# Patient Record
Sex: Male | Born: 1960 | Race: White | Hispanic: No | Marital: Married
Health system: Northeastern US, Academic
[De-identification: ages and names within clinical notes are randomized; demographics above are authoritative.]

## PROBLEM LIST (undated history)

## (undated) ENCOUNTER — Encounter

## (undated) DIAGNOSIS — F1010 Alcohol abuse, uncomplicated: Secondary | ICD-10-CM

## (undated) DIAGNOSIS — R569 Unspecified convulsions: Secondary | ICD-10-CM

## (undated) DIAGNOSIS — I10 Essential (primary) hypertension: Secondary | ICD-10-CM

## (undated) DIAGNOSIS — R7401 Elevation of levels of liver transaminase levels: Secondary | ICD-10-CM

## (undated) DIAGNOSIS — E119 Type 2 diabetes mellitus without complications: Secondary | ICD-10-CM

## (undated) HISTORY — DX: Elevation of levels of liver transaminase levels: R74.01

## (undated) HISTORY — PX: HEMORRHOID SURGERY: SHX153

---

## 2001-10-17 ENCOUNTER — Emergency Department (HOSPITAL_COMMUNITY): Admission: EM | Admit: 2001-10-17 | Discharge: 2001-10-17 | Payer: Self-pay | Admitting: *Deleted

## 2003-12-23 ENCOUNTER — Emergency Department (HOSPITAL_COMMUNITY): Admission: EM | Admit: 2003-12-23 | Discharge: 2003-12-23 | Payer: Self-pay | Admitting: Emergency Medicine

## 2006-07-21 ENCOUNTER — Emergency Department (HOSPITAL_COMMUNITY): Admission: EM | Admit: 2006-07-21 | Discharge: 2006-07-21 | Payer: Self-pay | Admitting: *Deleted

## 2007-10-05 ENCOUNTER — Emergency Department (HOSPITAL_COMMUNITY): Admission: EM | Admit: 2007-10-05 | Discharge: 2007-10-05 | Payer: Self-pay | Admitting: Emergency Medicine

## 2007-10-24 ENCOUNTER — Ambulatory Visit: Payer: Self-pay | Admitting: Internal Medicine

## 2008-07-22 ENCOUNTER — Encounter (INDEPENDENT_AMBULATORY_CARE_PROVIDER_SITE_OTHER): Payer: Self-pay | Admitting: General Surgery

## 2008-07-22 ENCOUNTER — Emergency Department (HOSPITAL_COMMUNITY): Admission: EM | Admit: 2008-07-22 | Discharge: 2008-07-22 | Payer: Self-pay | Admitting: Emergency Medicine

## 2008-07-23 ENCOUNTER — Inpatient Hospital Stay (HOSPITAL_COMMUNITY): Admission: AD | Admit: 2008-07-23 | Discharge: 2008-07-24 | Payer: Self-pay | Admitting: General Surgery

## 2008-10-06 ENCOUNTER — Encounter (INDEPENDENT_AMBULATORY_CARE_PROVIDER_SITE_OTHER): Payer: Self-pay | Admitting: *Deleted

## 2010-06-27 NOTE — Letter (Signed)
Summary: Appointment - Missed  [facility], Main Office  [address]   [place], [place] [address]   Phone: [phone]  Fax: [phone]     Oct 06, 2008 MRN: [id number]   [facility] [place] [address] [name], [place]  [address]   Dear Arthur [name],  Our records indicate you missed your appointment on  September 03, 2008 with Dr. Tenny Craw.                                    It is very important that we reach you to reschedule this appointment.   We look forward to participating in your health care needs. Please contact us at the number listed above at your earliest convenience to reschedule this appointment.  Sincerely,  Burnard Leigh [employer] Scheduling Team

## 2010-09-12 LAB — RAPID URINE DRUG SCREEN, HOSP PERFORMED
Amphetamines: NOT DETECTED
Barbiturates: NOT DETECTED
Benzodiazepines: NOT DETECTED
Cocaine: NOT DETECTED
Opiates: POSITIVE — AB
Tetrahydrocannabinol: NOT DETECTED

## 2010-09-12 LAB — HEMOGLOBIN AND HEMATOCRIT, BLOOD
HCT: 47.3 % (ref 39.0–52.0)
Hemoglobin: 15.4 g/dL (ref 13.0–17.0)

## 2010-10-10 NOTE — Op Note (Signed)
NAMESEVERN, Arthur [name]                  ACCOUNT NO.:  [id number]   MEDICAL RECORD NO.:  [id number]          PATIENT TYPE:  OIB   LOCATION:  1524                         FACILITY:  [facility]   PHYSICIAN:  Lennie Muckle, MD      DATE OF BIRTH:  1960/12/31   DATE OF PROCEDURE:  07/22/2008  DATE OF DISCHARGE:                               OPERATIVE REPORT   PROCEDURE:  Internal and external hemorrhoidectomy.   PREOPERATIVE DIAGNOSIS:  Thrombosed hemorrhoid.   POSTOPERATIVE DIAGNOSES:  Thrombosed hemorrhoid.   SURGEON:  Lennie Muckle, M.D.   ASSISTANT:  Ardeth Sportsman, M.D.   ANESTHESIA:  General endotracheal anesthesia.   FINDINGS:  A large thrombosed hemorrhoid at approximately 8 o'clock.  This was excised, sent to pathology for review.  Minimum amount of blood  loss.  No immediate complications.   INDICATIONS FOR PROCEDURE:  Arthur [name] is a 50 year old male who was seen  at the urgent clinic by Dr. Consuello Bossier.  He was found to have an  acute thrombosed hemorrhoid, rather large in nature, which was felt not  to be amenable to excision in the clinic.  Due to the size it was felt  he would be better served undergoing anesthesia and excision.  Informed  consent was obtained prior to the procedure.   DETAILS OF PROCEDURE:  Arthur [name] was given IV Cipro prior to procedure,  identified in the preoperative holding area, taken to the operating  room.  Once in the operating room, placed in the supine position.  After  administration of general endotracheal anesthesia, he was then placed in  the prone jack-knife position.  Buttock cheeks were taped apart.  Perineum was prepped and draped in the usual sterile fashion.  A time-  out procedure, indicating patient and procedure, was performed.   We injected Wydase into the hemorrhoidal side at approximately 8  o'clock.  Lidocaine 1% was also injected all around the sphincters in  all quadrants.  The anoscopy revealed a large internal-external  hemorrhoid at approximately 8 o'clock.  A smaller lesion was noted at  approximately 2 o'clock.  Using a large Kelly clamp, we clamped across  the internal component of the hemorrhoid.  Using a 2-0 chromic suture, I  performed baseball stitch fashion after excising the hemorrhoidal  tissue.  I went underneath the Kelly clamp and continued in a baseball  stitch fashion to the end.  I then placed another running suture more  anteriorly.  I then excised the external component of the hemorrhoid,  using electrocautery.  I also closed the dermis in a running fashion  with a chromic.  Anoscopy revealed some internal component remaining.  However, this was much smaller in nature.  There is no enlarged  hemorrhoidal tissue elsewhere.  Therefore, I did not perform an excision  of other tissue.  More lidocaine was injected in the hemorrhoidectomy  site.  Dibucaine was placed on the area of excision.  A Gelfoam was  placed in the internal canal with an ABD on top.  The patient was then  placed in  supine position, extubated, transported to the postanesthesia  care unit stable condition.   He will be given Percocet and morphine overnight for pain, started on  stool softener, MiraLax and Colace.  He will be discharged home.  He is  to perform sitz baths t.i.d. with two stool softeners, Percocet for  pain, and then will follow up in two or three weeks.      Lennie Muckle, MD  Electronically Signed     ALA/MEDQ  D:  07/22/2008  T:  07/23/2008  Job:  [id number]

## 2010-10-10 NOTE — Assessment & Plan Note (Signed)
[facility]                            CARDIOLOGY OFFICE NOTE   NAME:Placke, BURAK ZERBE                         MRN:          [id number]  DATE:10/24/2007                            DOB:          [birth year]    IDENTIFICATION:  Mr. Stanislaw is a 50 year old gentleman who was referred  from the emergency room for continued care of his hypertension and  history of chest pain.   HISTORY OF PRESENT ILLNESS:  The patient was seen in the emergency room  on May 19th.  He was complaining of chest pain.  Per report, the patient  had chest pain for about a week; worse if he lied down, better if sat  up.  Denied fevers.  No nausea, vomiting or cough.  Had mild shortness  of breath, symptoms worse at night.  When he went to the emergency room,  he denied any chest pain.  Of note, his blood pressure was noted to be  elevated in the emergency room he was told, but I do not have the  recording of this.  Coronary care markers were done and that was  negative.  EKG was done that showed LVH.  He was sent home with  continued care here.   Since leaving the emergency room, he has done okay.  He denies any chest  pain now.   ALLERGIES:  PENICILLIN.   PAST MEDICAL HISTORY:  Recently diagnosed hypertension.   SOCIAL HISTORY:  The patient smokes about a pack per day for the past 30  years.  Drinks occasionally.   FAMILY HISTORY:  Positive for hypertension and diabetes.   REVIEW OF SYSTEMS:  All systems reviewed.  Negative to the above problem  except as noted above.   PHYSICAL EXAM:  Currently, the patient is in no distress.  Blood pressure is 175/113, pulse is 71 and regular, weight 147.  HEENT:  Normocephalic, atraumatic, EOMI, PERLA.  Mucous membranes are  moist.  NECK:  JVP is normal.  No bruits.  LUNGS:  Clear without rales or wheezes.  CARDIAC EXAM:  Regular rate and rhythm, S1, S2, no S3, no significant  murmurs.  ABDOMEN:  Supple, nontender.  Normal bowel  sounds.  EXTREMITIES:  Good distal pulses throughout.  No lower extremity edema.   12-lead EKG:  Normal sinus rhythm, LVH, 69 beats per minute, nonspecific  T-wave changes.   IMPRESSION:  1. Mr. Lepak is a 50 year old recently seen in the emergency room for      chest pain which resolved; actually he did not have it in the      emergency room.  It sounds more muscular, possible pleuritic.  I      would follow.  2. Hypertension, markedly increased. I would set him on Norvasc 5 mg      and follow up in the clinic.  Also set him up with an      echocardiogram.   When he returns for the echocardiogram, I will get a fasting lipid panel  from [facility].  I will be back in touch with him  and see  him back in 4 weeks' time.     Pricilla Riffle, MD, [facility]  Electronically Signed    PVR/MedQ  DD: 10/24/2007  DT: 10/24/2007  Job #: [id number]

## 2011-02-21 LAB — URINALYSIS, ROUTINE W REFLEX MICROSCOPIC
Bilirubin Urine: NEGATIVE
Glucose, UA: NEGATIVE
Hgb urine dipstick: NEGATIVE
Ketones, ur: NEGATIVE
Nitrite: NEGATIVE
Protein, ur: NEGATIVE
Specific Gravity, Urine: 1.019
Urobilinogen, UA: 1
pH: 6

## 2011-02-21 LAB — POCT I-STAT, CHEM 8
BUN: 17
Calcium, Ion: 1.15
Chloride: 106
Creatinine, Ser: 1.3
Glucose, Bld: 84
HCT: 48
Hemoglobin: 16.3
Potassium: 5
Sodium: 137
TCO2: 27

## 2011-02-21 LAB — DIFFERENTIAL
Basophils Absolute: 0
Basophils Relative: 1
Eosinophils Absolute: 0.2
Eosinophils Relative: 3
Lymphocytes Relative: 30
Lymphs Abs: 1.8
Monocytes Absolute: 0.6
Monocytes Relative: 10
Neutro Abs: 3.5
Neutrophils Relative %: 57

## 2011-02-21 LAB — CBC
HCT: 42.5
Hemoglobin: 14.1
MCHC: 33.1
MCV: 78.3
Platelets: 216
RBC: 5.42
RDW: 14.6
WBC: 6.2

## 2011-02-21 LAB — POCT CARDIAC MARKERS
CKMB, poc: 3.8
Myoglobin, poc: 72.6
Operator id: 196461
Troponin i, poc: <0.05

## 2012-12-26 ENCOUNTER — Emergency Department (HOSPITAL_COMMUNITY)
Admission: EM | Admit: 2012-12-26 | Discharge: 2012-12-26 | Disposition: A | Discharge status: Home / Self Care | Payer: Self-pay | Attending: Emergency Medicine | Admitting: Emergency Medicine

## 2012-12-26 ENCOUNTER — Encounter (HOSPITAL_COMMUNITY): Payer: Self-pay | Admitting: Emergency Medicine

## 2012-12-26 DIAGNOSIS — F17200 Nicotine dependence, unspecified, uncomplicated: Secondary | ICD-10-CM | POA: Insufficient documentation

## 2012-12-26 DIAGNOSIS — Z880 Allergy status to penicillin: Secondary | ICD-10-CM | POA: Insufficient documentation

## 2012-12-26 DIAGNOSIS — I10 Essential (primary) hypertension: Secondary | ICD-10-CM | POA: Insufficient documentation

## 2012-12-26 DIAGNOSIS — K029 Dental caries, unspecified: Secondary | ICD-10-CM | POA: Insufficient documentation

## 2012-12-26 DIAGNOSIS — K089 Disorder of teeth and supporting structures, unspecified: Secondary | ICD-10-CM | POA: Insufficient documentation

## 2012-12-26 HISTORY — DX: Essential (primary) hypertension: I10

## 2012-12-26 MED ORDER — HYDROCODONE-ACETAMINOPHEN 5-325 MG PO TABS: ORAL_TABLET | ORAL | Status: DC

## 2012-12-26 MED ORDER — CLINDAMYCIN HCL 150 MG PO CAPS: 300.0000 mg | ORAL_CAPSULE | Freq: Three times a day (TID) | ORAL | Status: DC

## 2012-12-26 MED ORDER — IBUPROFEN 800 MG PO TABS: 800.0000 mg | ORAL_TABLET | Freq: Three times a day (TID) | ORAL | Status: DC

## 2012-12-26 NOTE — ED Provider Notes (Signed)
CSN: [id number]     Arrival date & time 12/26/12  1227 History     First MD Initiated Contact with Patient 12/26/12 1233     Chief Complaint  Patient presents with  . Dental Pain    1 week hx of l/low dental pain   (Consider location/radiation/quality/duration/timing/severity/associated sxs/prior Treatment) HPI Pt is a 52yo male presenting with 5 day hx of left lower tooth pain that is aching and throbbing, 6/10, worse with chewing.  Pt states he knows he has cavities and needs his molar to be seen by a dentist but has not been able to yet due to financial difficulty.  Has tried ibuprofen and OTC oral analgesic gel that provides minimal relief.  Denies fever, n/v/d.  Denies trouble swallowing or breathing.    Past Medical History  Diagnosis Date  . Hypertension    Past Surgical History  Procedure Laterality Date  . Hemorrhoid surgery     Family History  Problem Relation Age of Onset  . Diabetes Mother   . Hypertension Mother   . Diabetes Father   . Hypertension Father    History  Substance Use Topics  . Smoking status: Current Every Day Smoker    Types: Cigarettes  . Smokeless tobacco: Not on file  . Alcohol Use: Yes    Review of Systems  Constitutional: Negative for fever and chills.  HENT: Positive for dental problem.   All other systems reviewed and are negative.    Allergies  Penicillins  Home Medications   Current Outpatient Rx  Name  Route  Sig  Dispense  Refill  . benzocaine (ORAJEL) 10 % mucosal gel   Mouth/Throat   Use as directed 1 application in the mouth or throat as needed for pain.         [name] ibuprofen (ADVIL,MOTRIN) 800 MG tablet   Oral   Take 1,600 mg by mouth every 8 (eight) hours as needed for pain.         . clindamycin (CLEOCIN) 150 MG capsule   Oral   Take 2 capsules (300 mg total) by mouth 3 (three) times daily. May dispense as 150mg  capsules   60 capsule   0   . HYDROcodone-acetaminophen (NORCO/VICODIN) 5-325 MG per tablet     Take 1-2 pills every 4-6 hours as needed for pain.   10 tablet   0   . ibuprofen (ADVIL,MOTRIN) 800 MG tablet   Oral   Take 1 tablet (800 mg total) by mouth 3 (three) times daily.   21 tablet   0    BP 177/89  Pulse 78  Temp(Src) 98 F (36.7 C) (Oral)  Resp 18  Wt 147 lb (66.679 kg)  SpO2 100% Physical Exam  Nursing note and vitals reviewed. Constitutional: He appears well-developed and well-nourished.  HENT:  Head: Normocephalic and atraumatic. No trismus in the jaw.  Nose: Nose normal.  Mouth/Throat: Oropharynx is clear and moist and mucous membranes are normal. He does not have dentures. No oral lesions. Abnormal dentition. Dental caries present. No dental abscesses, edematous or lacerations. No oropharyngeal exudate, posterior oropharyngeal edema, posterior oropharyngeal erythema or tonsillar abscesses.    Teeth 17 and 18 (left lower molars) TTP, no obvious drainable dental abscess.  Visible dental caries.  Teeth in tact.  No peritonsillar abscess.   Eyes: Conjunctivae are normal. No scleral icterus.  Neck: Normal range of motion.  Cardiovascular: Normal rate, regular rhythm and normal heart sounds.   Pulmonary/Chest: Effort normal and breath sounds  normal. No respiratory distress. He has no wheezes. He has no rales. He exhibits no tenderness.  Musculoskeletal: Normal range of motion.  Neurological: He is alert.  Skin: Skin is warm and dry.  Psychiatric: He has a normal mood and affect. His behavior is normal.    ED Course   Procedures (including critical care time)  Labs Reviewed - No data to display No results found. 1. Pain due to dental caries     MDM  Pt with dental caries c/o 5 day hx of tooth pain.  No obvious dental abscess.  No peritonsillar abscess.    Rx: norco, ibuprofen, clindamycin.  Pt is to call Dr. Russella Dar, DDS, in 24-48hours to make a follow up appointment.     Junius Finner, PA-C 12/26/12 1328

## 2012-12-26 NOTE — ED Notes (Signed)
1 week hx of l/lower dental pain. Tx with OTC meds

## 2012-12-26 NOTE — Progress Notes (Signed)
[facility] provided patient with a [employer], a list of primary care resources, and dental resources.

## 2012-12-26 NOTE — ED Provider Notes (Signed)
  Medical screening examination/treatment/procedure(s) were performed by non-physician practitioner and as supervising physician I was immediately available for consultation/collaboration.   Raylinn Kosar, MD 12/26/12 1521 

## 2013-01-15 ENCOUNTER — Ambulatory Visit: Payer: Self-pay

## 2016-10-03 ENCOUNTER — Ambulatory Visit: Admitting: Family Medicine

## 2016-10-03 NOTE — Progress Notes (Signed)
Observations:  Added new observation of VFC ELIGIBLE: Not VFC Eligible (10/03/2016 10:03)  Added new observation of ENBSRVTYPENC: Quick Note (10/03/2016 10:03)  Added new observation of TDAP: done (04/02/2014 10:04)              Immunization History:  Historical Source: Historical information - Patient recall   Tdap: Tdap - Unspecified Formulation  #1 Historical (04/02/2014)

## 2016-10-08 ENCOUNTER — Ambulatory Visit

## 2016-10-24 ENCOUNTER — Ambulatory Visit

## 2016-10-24 ENCOUNTER — Ambulatory Visit: Admitting: Family Medicine

## 2016-10-24 LAB — HX LIPID PANEL FASTINGX
HX CHD RISK ASSESMENT FACTORX: 4
HX CHOLESTEROL (LIPR): 192 mg/dL (ref <200)
HX HDL CHOLESTEROLX: 48 mg/dL (ref 35.0–55.0)
HX LDL CHOLESTEROLX: 122 mg/dL (ref <130)
HX NON HDL CHOLESTEROLX: 144 mg/dL — ABNORMAL HIGH (ref <130)
HX TRIGLYCERIDES: 108 mg/dL (ref <150)

## 2016-10-24 LAB — HX COMPREHENSIVE METABOLIC PANELX
HX ALBUMIN: 4.6 g/dL (ref 3.2–4.9)
HX ALKALINE PHOSPHATASE: 76 U/L (ref 30.0–117.0)
HX ALT: 17 U/L (ref 0.0–40.0)
HX ANION GAP: 13 mmol/L (ref 9.0–19.0)
HX AST: 27 U/L (ref 0.0–37.0)
HX BICARBONATE: 24 mmol/L (ref 22.0–29.0)
HX BUN/CREAT RATIO: 20 (ref 12.0–20.0)
HX BUN: 16 mg/dL (ref 6.0–20.0)
HX CALCIUM: 9.5 mg/dL (ref 8.5–10.5)
HX CHLORIDE: 106 mmol/L (ref 98.0–110.0)
HX CREATININE: 0.8 mg/dL (ref 0.4–1.2)
HX GLOMERULAR FILTRATION RATE: 101
HX GLUCOSE: 86 mg/dL (ref 70.0–100.0)
HX HEMOLYSIS INDEX: 0 mg/dL (ref 0.0–50.0)
HX ICTERIC INDEX: 1 (ref 0.0–2.0)
HX LIPEMIC INDEX: 9 mg/dL (ref 0.0–40.0)
HX POTASSIUM: 4.1 mmol/L (ref 3.5–5.1)
HX SODIUM: 143 mmol/L (ref 137.0–146.0)
HX TOTAL BILIRUBIN: 0.5 mg/dL (ref 0.2–1.2)
HX TOTAL PROTEIN: 6.9 g/dL (ref 6.5–8.4)

## 2016-10-24 LAB — HX  COMPLETE BLOOD COUNT
HX HEMATOCRIT: 43.1 % (ref 41.0–53.0)
HX HEMOGLOBIN: 14.5 g/dL (ref 13.5–17.5)
HX MEAN CORP.HEMO.CONC.: 33.6 g/dL (ref 31.0–37.0)
HX MEAN CORPUSCULAR HEMOGLOBIN: 29.1 pg (ref 26.0–34.0)
HX MEAN CORPUSCULAR VOLUME: 86.5 fL (ref 80.0–100.0)
HX MEAN PLATELET VOLUME: 10.4 fL (ref 9.4–12.4)
HX PLATELET COUNT: 319 10*3/uL (ref 150.0–400.0)
HX RED BLOOD COUNT: 5 M/uL (ref 4.5–5.9)
HX RED CELL DISTRIBUTION WIDTH SD: 40.3 fL (ref 35.0–51.0)
HX WHITE BLOOD COUNT: 7.9 10*3/uL (ref 4.5–11.0)

## 2016-10-24 NOTE — Progress Notes (Signed)
History of Present Illness:  56 yo man is here for CPX.     Has switched from [facility].   No serious complaints, denies chest pain, SOB, Abdominal pain, palpitations, headache.         Current Problems- Reviewed during today's visit  SCIATICA, LEFT  SNORING  DIVERTICULOSIS  HX OF TESTICULAR CANCER  SEASONAL ALLERGIES  PREVENTIVE HEALTH CARE    Current Medications- Reviewed during today's visit  VITAMIN D 1000 UNIT ORAL TABLET (CHOLECALCIFEROL): Take one tab by mouth daily for bones/low vitamin D  ASPIRIN 81 MG ORAL TABLET DELAYED RELEASE: take one by mouth daily to prevent stroke/heart attack  MULTIVITAMINS ORAL CAPSULE (MULTIPLE VITAMIN): one by mouth daily for health    Current Allergies- Reviewed during today's visit  * NO KNOWN DRUG  ALLERGIES (Critical)  Surgical History  s/p R testicular Cancer removed 2002. k 19 radiation treaments, no chemo.   RIH repair 2002.  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   Family History  Father 76, s/p CVA in 65's, recovered,   Mother 47, hx of esophageal stricture.  4 sisters are healthy.   PGF  d. 67 of ? cause, PGM d. 65's, old age,  MGF d.82, leukemia,  MGM died young.    Social History  Works in Photographer as a Administrator, [employer]. Married with 3  children: 24, Janyth Pupa 21 at [place], daughter 28, Carli senior in [employer].  No pets.      Risk Factors  Tobacco User:no  Smoking Status:never smoked  Passive smoke exposure: No    Drug use: no  Alcohol use: yes      Alcohol Comments:  beer on Saturday nights.    Counseled to quit/cut down: No    HIV high risk behavior: no    Exercise: Yes  Exercise Comments:  walks the [facility], exercises 3 x a week.       Additional Data Recorded today    Colonoscopy:  Normal  on  05/10/2014    Review of Systems   General: Denies fever, chills, sweats, anorexia, fatigue, weakness, malaise, weight loss.   Eyes: Denies visual change or blurring, eye pain.   Ears/Nose/Throat: Denies earache, decreased hearing, difficulty swallowing.    Cardiovascular: Denies chest pain or pressure, palpitations, shortness of breath.   Respiratory: Denies dry cough, productive cough, shortness of breath, wheezing.   Gastrointestinal: Denies acid indigestion, nausea, vomiting, diarrhea, abdominal pain, change in bowel habits, constipation, mucous or blood in stools.   Musculoskeletal: Denies muscle cramps or aches, muscle weakness, morning stiffness, joint pain, joint swelling.   Skin: Denies dry skin, rash, skin ulcers, suspicious lesions.   Psychiatric: Denies anxiety, depression, insomnia.     Vital Signs     Weight: 167 lb. Height: 69.25  in.    BMI: 24.57  BSA: 1.92    No Previous weight   Temperature: 98.49 deg F.     Temp Site: oral    Pulse rate: 136  On Oxygen? No  Pulse Ox (SpO2): 98 BP: 120/76      Patient is not experiencing pain    Comments: Bobby Beltran is a 56 Year Old Male who presents today  for a NP-CPX  Medications and Allergies Reviewed    Signed: Creig Hines [place]....Oct 24, 2016 10:38 AM            Immunization History:  Historical Source: Historical information - Patient recall   Tdap: Tdap - Unspecified Formulation  #1  Historical (03/02/2014)      Physical Exam    General:      well developed, well nourished, in no acute distress.    Head:      normocephalic and atraumatic.    Eyes:      PERRL/EOM intact, conjunctiva and sclera clear with out nystagmus.    Ears:      TM's intact and clear with normal canals with grossly normal hearing.    Nose:      no deformity, discharge, inflammation, or lesions.    Mouth:      no deformity or lesions with good dentition.    Neck:      no masses, thyromegaly, or abnormal cervical nodes.    Chest Wall:      no deformities or breast masses noted.    Lungs:      clear bilaterally to auscultation.    Heart:      non-displaced PMI, chest non-tender; regular rate and rhythm, S1, S2 without murmurs, rubs, or gallops  Abdomen:       normal bowel sounds; no hepatosplenomegaly no ventral,umbilical hernias or masses  noted.    Rectal:      normal exam.    Genitalia:      normal male, testes descended bilaterally without masses, no hernias, no varicoceles noted.    Prostate:      normal size prostate without nodules or assymetry  Msk:      no deformity or scoliosis noted of thoracic or lumbar spine.    Pulses:      pulses normal in all 4 extremities.    Extremities:      no clubbing, cyanosis, edema, or deformity noted with normal full range of motion of all joints.    Neurologic:      no focal deficits, cranial nerves II-XII grossly intact with normal sensation, reflexes, coordination, muscle strength and tone.    Skin:      intact without lesions or rashes.    Cervical Nodes:      no significant adenopathy.    Axillary Nodes:      no significant adenopathy.    Inguinal Nodes:      no significant adenopathy.    Psych:      alert and cooperative; normal mood and affect; normal attention span and concentration.           Assessment and Plan:      ~ SCIATICA, LEFT (M54.32) :  Stable.       ~ PREVENTIVE HEALTH CARE (Z00.00) :  Encouraged exercise of all types, healthy diet, safety, seatbelts.  Patient will call for results.        Patient/Caregiver understand instructions and plan.    New/Revised  Medications Today:   VITAMIN D 1000 UNIT ORAL TABLET (CHOLECALCIFEROL) Take one tab by mouth daily for bones/low vitamin D  ASPIRIN 81 MG ORAL TABLET DELAYED RELEASE (ASPIRIN) take one by mouth daily to prevent stroke/heart attack  MULTIVITAMINS ORAL CAPSULE (MULTIPLE VITAMIN) one by mouth daily for health                      ]

## 2018-02-21 ENCOUNTER — Ambulatory Visit: Admitting: Adult Health

## 2018-02-21 NOTE — Progress Notes (Signed)
Orders:  Added new Test order of COMMP -Comp. Metabolic Panel (CMP) - Signed  Added new Test order of Lipid Panel (LIPR) - Signed  Observations:  Added new observation of ENBSRVTYPENC: Quick Note (02/21/2018 14:10)

## 2018-02-22 ENCOUNTER — Ambulatory Visit

## 2018-02-22 NOTE — Progress Notes (Signed)
History of Present Illness:   57 yo male for cpx     Hx testicular cancer 2003   Utd w preventive care   Exercises routinely Feels well Has no issues or concerns       Current Problems  DIVERTICULOSIS  HX OF TESTICULAR CANCER  SEASONAL ALLERGIES  PREVENTIVE HEALTH CARE    Current Medications- Reviewed during today's visit  VITAMIN D 1000 UNIT ORAL TABLET (CHOLECALCIFEROL): Take one tab by mouth daily for bones/low vitamin D  MULTIVITAMINS ORAL CAPSULE (MULTIPLE VITAMIN): one by mouth daily for health    Current Allergies- Reviewed during today's visit  * NO KNOWN DRUG  ALLERGIES (Critical)    Past Medical History  Sciatica; left  Snoring    Surgical History  s/p R testicular Cancer removed 2002. k 19 radiation treaments, no chemo.   RIH repair 2002.  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     Family History  Father 59, s/p CVA in 78's, recovered, triple CABBG  for CAD Mother 80, hx of esophageal stricture.  4 sisters are healthy.   PGF  d. 38 of ? cause, PGM d. 59's, old age,  MGF d.82, leukemia,  MGM died young.      Social History  Works in Photographer as a Administrator, [employer]. Married with 3  children: 25, Janyth Pupa 23  lives at home . Daughter 73, Carli Engineer, maintenance ([employer]) institute [place]    Risk Factors  Smoking Status: never smoked  Passive smoke exposure: No    Drug use: no  Alcohol use: yes      Alcohol Comments:  beer on Saturday nights.    Counseled to quit/cut down: No    HIV high risk behavior: no    Exercise: Yes  Exercise Comments:  walks the [facility], exercises 3 x a week.   uses exercise equipment         Review of Systems   General: Denies fever, chills, sweats, anorexia, fatigue, weakness, malaise, weight loss.   Eyes: Denies visual change or blurring, eye pain.   Ears/Nose/Throat: Denies earache, decreased hearing, difficulty swallowing.   Cardiovascular: Denies chest pain or pressure, palpitations, shortness of breath.   Respiratory: Denies dry cough, productive cough, shortness of breath, wheezing.    Gastrointestinal: Denies acid indigestion, nausea, vomiting, diarrhea, abdominal pain, change in bowel habits, constipation, mucous or blood in stools.   Genitourinary: Denies dysuria, decreased urinary stream, nocturia, erectile dysfunction, testicular pain or masses.   Musculoskeletal: Denies muscle cramps or aches, muscle weakness, morning stiffness, joint pain, joint swelling.   Skin: Denies dry skin, rash, skin ulcers, suspicious lesions.   Neurologic: Denies memory loss, parasthesias, dizziness, headaches, transient weakness.   Psychiatric: Denies anxiety, depression, insomnia.   Endocrine: Denies skin changes, hair loss, weight gain, weight loss, cold intolerance, heat intolerance, polyuria, polydipsia, loss of libido.   Heme/Lymphatic: Denies easy bruising, fatigue, unusual bleeding, fevers, night sweats.     Vital Signs     Patient: 57 Years Old Male  Height:  69.25 in.  Weight: 160 lbs      Wt Chg: -7 since 10/24/2016  BMI:  23.54        24.57 on 10/24/2016  BP:  125/82 right arm, normal cuff, seated     120/76 on 10/24/2016   Pulse:  82         Pulse Ox: 98 %    Comments: Bobby Beltran is a 57 Year Old Male who is here today  for a physical.   Medications and Allergies Reviewed    Signed: Majel Homer Harrington Beltran.Marland KitchenMarland KitchenMarland KitchenSeptember 28, 2019 11:05 AM    PHQ 2    Over the last 2 weeks, how often have you been bothered by any of the following problems?  1. Little interest or pleasure in doing things:  0   - Not at all  2. Feeling down, depressed, or hopeless:  0   - Not at all        Vaccines Ordered: Flu IM  Ordering Provider:  Foley NP; Dee        Physical Exam    General:      well developed, well nourished, in no acute distress.    Head:      normocephalic and atraumatic.    Eyes:      PERRL/EOM intact, conjunctiva and sclera clear with out nystagmus.    Ears:      TM's intact and clear with normal canals with grossly normal hearing.    Nose:      no deformity, discharge, inflammation, or lesions.    Mouth:      no  deformity or lesions with good dentition.    Neck:      no masses, thyromegaly, or abnormal cervical nodes.  no bruit.    Chest Wall:      no deformities or breast masses noted.    Lungs:      clear bilaterally to auscultation.    Heart:      non-displaced PMI, chest non-tender; regular rate and rhythm, S1, S2 without murmurs, rubs, or gallops  Abdomen:       normal bowel sounds; no hepatosplenomegaly no ventral,umbilical hernias or masses noted.    Rectal:      normal exam.  hemoccult negative.    Genitalia:      normal male,  L testes descended without masses, no hernias, no varicoceles noted.    Prostate:      normal size prostate without nodules or assymetry  Msk:      no deformity or scoliosis noted of thoracic or lumbar spine.    Pulses:      pulses normal in all 4 extremities.    Extremities:      no clubbing, cyanosis, edema, or deformity noted with normal full range of motion of all joints.    Neurologic:      no focal deficits, cranial nerves II-XII grossly intact with normal sensation, reflexes, coordination, muscle strength and tone.    Skin:      intact without lesions or rashes.    Cervical Nodes:      no significant adenopathy.    Axillary Nodes:      no significant adenopathy.    Inguinal Nodes:      no significant adenopathy.    Psych:      alert and cooperative; normal mood and affect; normal attention span and concentration.           Assessment and Plan:      ~PREVENTIVE HEALTH CARE (Z00.00)      Medication(s): VITAMIN D 1000 UNIT ORAL TABLET (CHOLECALCIFEROL): Take one tab by mouth daily for bones/low vitamin D, MULTIVITAMINS ORAL CAPSULE (MULTIPLE VITAMIN): one by mouth daily for health    Order(s): FIT - Fecal Immunoassay Test, PSA - Screening**  cmp,lipids. Reinforced self care healthy habits, continued exercise         Medications Removed Today:   ASPIRIN 81 MG ORAL TABLET DELAYED RELEASE (  ASPIRIN) take one by mouth daily to prevent stroke/heart attack; Route: ORAL    Patient  Instructions    Fu  in 1 yr

## 2018-03-22 ENCOUNTER — Ambulatory Visit: Admitting: Adult Health

## 2018-03-22 ENCOUNTER — Ambulatory Visit

## 2018-03-22 LAB — HX COMPREHENSIVE METABOLIC PANEL
HX ALBUMIN: 4 g/dL (ref 3.2–5.0)
HX ALKALINE PHOSPHATASE: 80 U/L (ref 30.0–117.0)
HX ALT: 24 U/L (ref 6.0–55.0)
HX ANION GAP: 8 (ref 3.0–11.0)
HX AST: 14 U/L (ref 6.0–40.0)
HX BICARBONATE: 25 mmol/L (ref 21.0–32.0)
HX BUN: 15 mg/dL (ref 6.0–20.0)
HX CALCIUM: 9.9 mg/dL (ref 8.5–10.5)
HX CHLORIDE: 107 mmol/L (ref 98.0–110.0)
HX CREATININE: 0.84 mg/dL (ref 0.55–1.3)
HX GLOMERULAR FR AFRICAN AMERICAN: >90
HX GLOMERULAR FR NON AFRICAN AMER: >90
HX GLUCOSE: 87 mg/dL (ref 70.0–110.0)
HX POTASSIUM: 4.3 mmol/L (ref 3.6–5.2)
HX SODIUM: 140 mmol/L (ref 136.0–146.0)
HX TOTAL BILIRUBIN: 0.5 mg/dL (ref 0.2–1.2)
HX TOTAL PROTEIN: 6.9 g/dL (ref 6.0–8.4)

## 2018-03-22 LAB — HX LIPID PANEL FASTING
HX CHOLESTEROL (LIPR): 196 mg/dL (ref <=200)
HX HDL CHOLESTEROL: 50 mg/dL (ref >=40)
HX LDL CHOLESTEROL: 122 mg/dL (ref <=130)
HX TRIGLYCERIDES: 121 mg/dL (ref <=150)

## 2018-03-22 LAB — HX PROSTATE SPECIFIC ANTIGEN SCR: HX PROSTATE SPECIFIC ANTIGEN SCR: 1 ng/mL (ref 0.0–4.0)

## 2018-03-25 ENCOUNTER — Ambulatory Visit: Admitting: Adult Health

## 2018-03-25 NOTE — Progress Notes (Signed)
Haynes Bast, MD and Ellan Lambert, MD   [address]   [place], [place] [address]  Office: [phone] Fax: [phone]  March 25, 2018      Bobby Beltran  [address]  [place], [place]  [address]    Dear Loraine Leriche,    I have received the results of your most recent labwork. The results are listed below:     Labs Your Value Normal Result Date   Total Cholesterol: 196 Goal: less than 200 03/22/2018   HDL (good cholesterol):  50 Normal : >40 03/22/2018   LDL (bad cholesterol):  122 Goal: less than 130 03/22/2018   Triglycerides: 121 Goal: less than 200 03/22/2018   LDL-direct (bad cholesterol):  Goal: less than 130    Cardiac CRP (helps predict risk of heart disease)  0-1 = low risk  1-3 = average risk  >3 = high risk      Blood sugar 87 Normal : 70-100 03/22/2018   Hemoglobin A1C   (3 month sugar test)  Normal: 4.2 - 5.6  Prediabetes: 5.7-6.4  Diabetes: >6.5      Estimated Average Glucose  (3 month Average)  Goal (if you have Diabetes): less than 150    Urine microalbumin     Normal: < 20       Creatinine (kidney function) 0.84 Normal: 0.55 - 1.3   03/22/2018   ALT (liver test) 24 Normal ALT 6-55   03/22/2018   AST (liver test) 14  Normal AST 6-40 03/22/2018   Hematocrit   (blood count) 43.1 Normal male: 68- 40  Normal male: 40-52 10/24/2016   TSH (thyroid test)  Normal: 0.35-3.7    TSH (ultra thyroid test)  Normal: 0.35 - 3.7      PSA (prostate test)   1.00 NGM/ML   Normal: less than 4.0   03/22/2018   Uric Acid (high in people with gout)  Normal < 7.0    Results are in acceptable ranges.       Sincerely,        Virgina Organ NP

## 2018-09-25 ENCOUNTER — Ambulatory Visit

## 2018-10-07 ENCOUNTER — Ambulatory Visit

## 2019-04-18 ENCOUNTER — Ambulatory Visit: Admitting: Adult Health

## 2019-04-18 NOTE — Progress Notes (Signed)
 Visit Type:  Annual Physical  Primary Provider:  Haynes Bast, M.D.      History of Present Illness:  58 y.o. male presents for cpx.  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        Current Allergies- Reviewed during today's visit  * NO KNOWN DRUG  ALLERGIES (Critical)              Review of Systems   General: Denies fever, chills, sweats, anorexia, fatigue, weakness, malaise, weight loss.   Eyes: Denies visual change or blurring, eye pain.   Ears/Nose/Throat: Denies earache, decreased hearing, difficulty swallowing.   Cardiovascular: Denies chest pain or pressure, palpitations, shortness of breath.   Respiratory: Denies dry cough, productive cough, shortness of breath, wheezing.   Gastrointestinal: Denies acid indigestion, nausea, vomiting, diarrhea, abdominal pain, change in bowel habits, constipation, mucous or blood in stools.   Genitourinary: Denies dysuria, decreased urinary stream, nocturia, erectile dysfunction, testicular pain or masses.   Musculoskeletal: Denies muscle cramps or aches, muscle weakness, morning stiffness, joint pain, joint swelling.   Skin: Denies dry skin, rash, skin ulcers, suspicious lesions.   Neurologic: Denies memory loss, parasthesias, dizziness, headaches, transient weakness.   Psychiatric: Denies anxiety, depression, insomnia.   Endocrine: Denies skin changes, hair loss, weight gain, weight loss, cold intolerance, heat intolerance, polyuria, polydipsia, loss of libido.   Heme/Lymphatic: Denies easy bruising, fatigue, unusual bleeding, fevers, night sweats.     Vital Signs     Patient: 58 Years Old Male  Height:  69.25 in.  Weight: 169.20 lbs      Wt Chg: 9.20 since 02/22/2018  BMI:  24.90        23.54 on 02/22/2018  BP:  124/78     125/82 on  02/22/2018   Temp:  98.2  F      Pulse:  84         Pulse Ox: 97 %    Comments: CPX Wants a referral to a derm   Medications and Allergies Reviewed    Signed: Evaristo Beltran.Marland KitchenMarland KitchenMarland KitchenNovember 21, 2020 10:16 AM    PHQ 2    Over the last 2 weeks, how often have you been bothered by any of the following problems?  1. Little interest or pleasure in doing things:  0   - Not at all  2. Feeling down, depressed, or hopeless:  0   - Not at all    Social Determinants of Health    What is your housing situation today?        I have housing  Within the past 12 months, we worried whether our food would run out before we got money to buy more.        Never true  Within the last 12 months, the food bought just didn't last and didn't have money to get more.        Never true  In the last 12 months have the electric, gas, oil, or water company threatened to shut off services in your home?        No  In the last 12 months, have you missed a medical appointment because you didn't have a way to get there?  No  Are you currently unemployed or looking for work?        No  Do you feel physically and emotionally safe where you currently live?        Yes  Do you ever feel alone or isolated from friends, family or anyone else in your life?        No I do not feel alone or isolated  Would you like help with any of the above? No      Vaccines the patient has refused   Zoster: Patient refused   Flu Vaccine: Patient refused         Vaccines reviewed - patient/parent declines  Vaccines Ordered: Flu IM  Ordering Provider:  Claiborne Billings NP; Misty Stanley        Physical Exam    General:      well developed, well nourished, in no acute distress.    Head:      normocephalic and atraumatic.    Eyes:      PERRL/EOM intact, conjunctiva and sclera clear with out nystagmus.    Ears:      TM's intact and clear with normal canals with grossly normal hearing.    Nose:      no deformity, discharge, inflammation, or lesions.    Mouth:      no deformity or lesions with good  dentition.    Neck:      no masses, thyromegaly, or abnormal cervical nodes.  no bruit.    Chest Wall:      no deformities or breast masses noted.    Breasts:      no masses or gynecomastia noted.    Lungs:      clear bilaterally to auscultation.    Heart:      non-displaced PMI, chest non-tender; regular rate and rhythm, S1, S2 without murmurs, rubs, or gallops  Abdomen:       normal bowel sounds; no hepatosplenomegaly no ventral,umbilical hernias or masses noted.    Rectal:      normal exam.  hemoccult negative.    Genitalia:      normal male,  L testes descended bilaterally without masses, no hernias, no varicoceles noted.    Prostate:      normal size prostate without nodules or assymetry  Msk:      no deformity or scoliosis noted of thoracic or lumbar spine.    Pulses:      pulses normal in all 4 extremities.    Extremities:      no clubbing, cyanosis, edema, or deformity noted with normal full range of motion of all joints.    Neurologic:      no focal deficits, cranial nerves II-XII grossly intact with normal sensation, reflexes, coordination, muscle strength and tone.    Skin:      intact without lesions or rashes.   back with large pearly lesion   Cervical Nodes:      no significant adenopathy.    Axillary Nodes:      no significant adenopathy.    Inguinal Nodes:      no significant adenopathy.    Psych:      alert and cooperative; normal mood and affect; normal attention span and concentration.           Assessment and Plan:      ~DIVERTICULOSIS (K57.90)  Stable. due for colonoscopy      ~HX OF TESTICULAR CANCER (Z85.47)  Stable.      ~SEASONAL ALLERGIES (  J30.2)  Stable.      ~PREVENTIVE HEALTH CARE (Z00.00)  labs, refused flu vaccine, refer to dermatology for removal and skin check      ~FAMILY HISTORY OF SQUAMOUS CELL CARCINOMA OF SKIN (Z80.8)  refer to dermatology         Problems Reviewed  Patient/Caregiver understand instructions and plan.    Patient Instructions     DR. Obriens office [phone]  (  Dr.Taub)

## 2019-08-08 ENCOUNTER — Ambulatory Visit

## 2019-08-08 ENCOUNTER — Ambulatory Visit: Admitting: Adult Health

## 2019-08-08 LAB — HX COMPREHENSIVE METABOLIC PANEL
HX ALBUMIN: 4.1 g/dL (ref 3.2–5.0)
HX ALKALINE PHOSPHATASE: 75 U/L (ref 30.0–117.0)
HX ALT: 26 U/L (ref 6.0–55.0)
HX ANION GAP: 7 (ref 3.0–11.0)
HX AST: 17 U/L (ref 6.0–40.0)
HX BICARBONATE: 27 mmol/L (ref 21.0–32.0)
HX BUN: 16 mg/dL (ref 6.0–20.0)
HX CALCIUM: 9.4 mg/dL (ref 8.5–10.5)
HX CHLORIDE: 106 mmol/L (ref 98.0–110.0)
HX CREATININE: 0.8 mg/dL (ref 0.55–1.3)
HX GLOMERULAR FR AFRICAN AMERICAN: >90
HX GLOMERULAR FR NON AFRICAN AMER: >90
HX GLUCOSE: 90 mg/dL (ref 70.0–110.0)
HX POTASSIUM: 4.7 mmol/L (ref 3.6–5.2)
HX SODIUM: 140 mmol/L (ref 136.0–146.0)
HX TOTAL BILIRUBIN: 0.5 mg/dL (ref 0.2–1.2)
HX TOTAL PROTEIN: 7 g/dL (ref 6.0–8.4)

## 2019-08-08 LAB — HX  COMPLETE BLOOD COUNT
HX HEMATOCRIT: 48.6 % (ref 39.0–53.0)
HX HEMOGLOBIN: 16.1 g/dL (ref 13.0–17.5)
HX MEAN CORP.HEMO.CONC.: 33.1 g/dL (ref 31.0–37.0)
HX MEAN CORPUSCULAR HEMOGLOBIN: 29.1 pg (ref 26.0–34.0)
HX MEAN CORPUSCULAR VOLUME: 87.9 fL (ref 80.0–100.0)
HX MEAN PLATELET VOLUME: 10.4 fL (ref 9.4–12.4)
HX NUCLEATED RBC %: 0 % (ref 0.0–0.0)
HX PLATELET COUNT: 341 10*3/uL (ref 150.0–400.0)
HX RED BLOOD COUNT: 5.53 10*6/uL (ref 4.2–5.9)
HX RED CELL DISTRIBUTION WIDTH SD: 40.5 fL (ref 35.0–51.0)
HX WHITE BLOOD COUNT: 7 10*3/uL (ref 4.0–11.0)

## 2019-08-08 LAB — HX LIPID PANEL FASTING
HX CHOLESTEROL (LIPR): 212 mg/dL — ABNORMAL HIGH (ref <=200)
HX HDL CHOLESTEROL: 46 mg/dL (ref >=40)
HX LDL CHOLESTEROL: 139 mg/dL — ABNORMAL HIGH (ref <=130)
HX TRIGLYCERIDES: 134 mg/dL (ref <=150)

## 2019-08-08 LAB — HX HEPATITIS C VIRAL ANTIBODY: HX HEPATITIS C VIRAL ANTIBODY: NONREACTIVE

## 2019-10-02 ENCOUNTER — Observation Stay (HOSPITAL_COMMUNITY): Payer: Self-pay

## 2019-10-02 ENCOUNTER — Emergency Department (HOSPITAL_COMMUNITY): Payer: Self-pay

## 2019-10-02 ENCOUNTER — Ambulatory Visit (HOSPITAL_COMMUNITY)
Admission: EM | Admit: 2019-10-02 | Discharge: 2019-10-02 | Disposition: A | Discharge status: Home / Self Care | Payer: Self-pay | Attending: Internal Medicine | Admitting: Internal Medicine

## 2019-10-02 ENCOUNTER — Other Ambulatory Visit: Payer: Self-pay

## 2019-10-02 ENCOUNTER — Encounter (HOSPITAL_COMMUNITY): Payer: Self-pay

## 2019-10-02 ENCOUNTER — Inpatient Hospital Stay (HOSPITAL_COMMUNITY)
Admission: EM | Admit: 2019-10-02 | Discharge: 2019-10-05 | DRG: 066 | Disposition: A | Discharge status: Home / Self Care | Payer: Self-pay | Source: Ambulatory Visit | Attending: Internal Medicine | Admitting: Internal Medicine

## 2019-10-02 DIAGNOSIS — Z20822 Contact with and (suspected) exposure to covid-19: Secondary | ICD-10-CM | POA: Diagnosis present

## 2019-10-02 DIAGNOSIS — I6529 Occlusion and stenosis of unspecified carotid artery: Secondary | ICD-10-CM | POA: Diagnosis present

## 2019-10-02 DIAGNOSIS — F17210 Nicotine dependence, cigarettes, uncomplicated: Secondary | ICD-10-CM | POA: Diagnosis present

## 2019-10-02 DIAGNOSIS — I10 Essential (primary) hypertension: Secondary | ICD-10-CM | POA: Diagnosis present

## 2019-10-02 DIAGNOSIS — Z833 Family history of diabetes mellitus: Secondary | ICD-10-CM

## 2019-10-02 DIAGNOSIS — R29704 NIHSS score 4: Secondary | ICD-10-CM | POA: Diagnosis present

## 2019-10-02 DIAGNOSIS — I639 Cerebral infarction, unspecified: Secondary | ICD-10-CM

## 2019-10-02 DIAGNOSIS — R4182 Altered mental status, unspecified: Secondary | ICD-10-CM | POA: Diagnosis present

## 2019-10-02 DIAGNOSIS — E785 Hyperlipidemia, unspecified: Secondary | ICD-10-CM | POA: Diagnosis present

## 2019-10-02 DIAGNOSIS — I63412 Cerebral infarction due to embolism of left middle cerebral artery: Principal | ICD-10-CM | POA: Diagnosis present

## 2019-10-02 DIAGNOSIS — Z8673 Personal history of transient ischemic attack (TIA), and cerebral infarction without residual deficits: Secondary | ICD-10-CM

## 2019-10-02 DIAGNOSIS — F1010 Alcohol abuse, uncomplicated: Secondary | ICD-10-CM | POA: Diagnosis present

## 2019-10-02 DIAGNOSIS — Z880 Allergy status to penicillin: Secondary | ICD-10-CM

## 2019-10-02 DIAGNOSIS — Z8249 Family history of ischemic heart disease and other diseases of the circulatory system: Secondary | ICD-10-CM

## 2019-10-02 DIAGNOSIS — R4701 Aphasia: Secondary | ICD-10-CM | POA: Diagnosis present

## 2019-10-02 DIAGNOSIS — I6930 Unspecified sequelae of cerebral infarction: Secondary | ICD-10-CM

## 2019-10-02 HISTORY — DX: Alcohol abuse, uncomplicated: F10.10

## 2019-10-02 LAB — URINALYSIS, ROUTINE W REFLEX MICROSCOPIC
Bacteria, UA: NONE SEEN
Bilirubin Urine: NEGATIVE
Glucose, UA: NEGATIVE mg/dL
Hgb urine dipstick: NEGATIVE
Ketones, ur: 5 mg/dL — AB
Leukocytes,Ua: NEGATIVE
Nitrite: NEGATIVE
Protein, ur: 100 mg/dL — AB
Specific Gravity, Urine: 1.032 — ABNORMAL HIGH (ref 1.005–1.030)
pH: 6 (ref 5.0–8.0)

## 2019-10-02 LAB — RESPIRATORY PANEL BY RT PCR (FLU A&B, COVID)
Influenza A by PCR: NEGATIVE
Influenza B by PCR: NEGATIVE
SARS Coronavirus 2 by RT PCR: NEGATIVE

## 2019-10-02 LAB — DIFFERENTIAL
Abs Immature Granulocytes: 0.01 10*3/uL (ref 0.00–0.07)
Basophils Absolute: 0 10*3/uL (ref 0.0–0.1)
Basophils Relative: 1 %
Eosinophils Absolute: 0.1 10*3/uL (ref 0.0–0.5)
Eosinophils Relative: 1 %
Immature Granulocytes: 0 %
Lymphocytes Relative: 30 %
Lymphs Abs: 1.6 10*3/uL (ref 0.7–4.0)
Monocytes Absolute: 0.7 10*3/uL (ref 0.1–1.0)
Monocytes Relative: 14 %
Neutro Abs: 2.8 10*3/uL (ref 1.7–7.7)
Neutrophils Relative %: 54 %

## 2019-10-02 LAB — APTT: aPTT: 28 seconds (ref 24–36)

## 2019-10-02 LAB — CBC
HCT: 40.9 % (ref 39.0–52.0)
Hemoglobin: 13.8 g/dL (ref 13.0–17.0)
MCH: 27.5 pg (ref 26.0–34.0)
MCHC: 33.7 g/dL (ref 30.0–36.0)
MCV: 81.6 fL (ref 80.0–100.0)
Platelets: 200 10*3/uL (ref 150–400)
RBC: 5.01 MIL/uL (ref 4.22–5.81)
RDW: 16.3 % — ABNORMAL HIGH (ref 11.5–15.5)
WBC: 5.2 10*3/uL (ref 4.0–10.5)
nRBC: 0 % (ref 0.0–0.2)

## 2019-10-02 LAB — COMPREHENSIVE METABOLIC PANEL
ALT: 52 U/L — ABNORMAL HIGH (ref 0–44)
AST: 53 U/L — ABNORMAL HIGH (ref 15–41)
Albumin: 4 g/dL (ref 3.5–5.0)
Alkaline Phosphatase: 72 U/L (ref 38–126)
Anion gap: 10 (ref 5–15)
BUN: 9 mg/dL (ref 6–20)
CO2: 25 mmol/L (ref 22–32)
Calcium: 9 mg/dL (ref 8.9–10.3)
Chloride: 101 mmol/L (ref 98–111)
Creatinine, Ser: 0.99 mg/dL (ref 0.61–1.24)
GFR calc Af Amer: >60 mL/min (ref >60)
GFR calc non Af Amer: >60 mL/min (ref >60)
Glucose, Bld: 96 mg/dL (ref 70–99)
Potassium: 4 mmol/L (ref 3.5–5.1)
Sodium: 136 mmol/L (ref 135–145)
Total Bilirubin: 0.7 mg/dL (ref 0.3–1.2)
Total Protein: 7.4 g/dL (ref 6.5–8.1)

## 2019-10-02 LAB — RAPID URINE DRUG SCREEN, HOSP PERFORMED
Amphetamines: NOT DETECTED
Barbiturates: NOT DETECTED
Benzodiazepines: NOT DETECTED
Cocaine: NOT DETECTED
Opiates: NOT DETECTED
Tetrahydrocannabinol: NOT DETECTED

## 2019-10-02 LAB — PROTIME-INR
INR: 1 (ref 0.8–1.2)
Prothrombin Time: 12.5 seconds (ref 11.4–15.2)

## 2019-10-02 LAB — AMMONIA: Ammonia: 34 umol/L (ref 9–35)

## 2019-10-02 LAB — ETHANOL: Alcohol, Ethyl (B): <10 mg/dL (ref <10)

## 2019-10-02 MED ORDER — THIAMINE HCL 100 MG PO TABS
100.0000 mg | ORAL_TABLET | Freq: Every day | ORAL | Status: DC
  Administered 2019-10-03 – 2019-10-05 (×3): 100 mg via ORAL
  Filled 2019-10-02 (×3): qty 1

## 2019-10-02 MED ORDER — ACETAMINOPHEN 650 MG RE SUPP: 650.0000 mg | Freq: Four times a day (QID) | RECTAL | Status: DC | PRN

## 2019-10-02 MED ORDER — ACETAMINOPHEN 325 MG PO TABS
650.0000 mg | ORAL_TABLET | Freq: Four times a day (QID) | ORAL | Status: DC | PRN
  Filled 2019-10-02: qty 2

## 2019-10-02 MED ORDER — SENNOSIDES-DOCUSATE SODIUM 8.6-50 MG PO TABS: 1.0000 | ORAL_TABLET | Freq: Every evening | ORAL | Status: DC | PRN

## 2019-10-02 MED ORDER — ENOXAPARIN SODIUM 40 MG/0.4ML ~~LOC~~ SOLN
40.0000 mg | SUBCUTANEOUS | Status: DC
  Administered 2019-10-03 – 2019-10-04 (×2): 40 mg via SUBCUTANEOUS
  Filled 2019-10-02 (×2): qty 0.4

## 2019-10-02 MED ORDER — FOLIC ACID 1 MG PO TABS
1.0000 mg | ORAL_TABLET | Freq: Every day | ORAL | Status: DC
  Administered 2019-10-03 – 2019-10-05 (×3): 1 mg via ORAL
  Filled 2019-10-02 (×3): qty 1

## 2019-10-02 MED ORDER — IOHEXOL 350 MG/ML SOLN
75.0000 mL | Freq: Once | INTRAVENOUS | Status: AC | PRN
  Administered 2019-10-02: 75 mL via INTRAVENOUS

## 2019-10-02 NOTE — ED Notes (Signed)
Pt given ice water per request. No additional requests at this time.

## 2019-10-02 NOTE — Hospital Course (Addendum)
Admitted 10/02/2019  Allergies: Penicillins Pertinent Hx: HTN, alcohol use disorder  59 y.o. male p/w confusion, aphasia x 2 days. Sent to ED from [facility]  * Acute ischemic stroke: Confused, aphasic and could not follow commands on arrival. CT A showed acute stroke. Neuro recommended MRI. Mental status slightly improved on admission but still not fully oriented & has word finding difficulty, impaired naming, repetition. Pending neuro rec.  *Alcohol use disorder: 3-4 mini bottles of liquor a day. Last consumption a day before admission.  Ethanol level normal.On CIWA.  Home meds: Amlodipine, Norco, ibuprofen Labs: Creatinine 0.99.  AST/ALT 53/52  Consults: Neurology Meds: thiamine, folate, VTE ppx: lovenox IVF: none Diet: npo until passes swallow eval

## 2019-10-02 NOTE — Consult Note (Signed)
Neurology Consultation  Reason for Consult: Aphasia Referring Physician: Dr. Kennis Carina  CC: Speech difficulty  History is obtained from: Chart review.  Attempted to call spouse-listed on the chart-no response  HPI: Arthur [name] is a 59 y.o. male past medical history of alcohol abuse and hypertension, brought in by family member to the emergency room for evaluation because of not acting right and acting confused. It is reported that he had been for the past 2 days not being able to talk correctly.  He cannot recall things and appeared confused.  Initially evaluated for confusion.  Given the strong alcohol abuse history, lab work and blood work performed and brain imaging ordered, MRI brain that was completed revealed an evolving left MCA territory infarct that is subacute. Neurological consultation obtained for further recommendations. I attempted to call the spouse, listed on the chart but was not able to speak with anyone as there was no answer on the other hand. Patient is unable to provide any reliable or coherent history at this time.   LKW: 2 days ago per chart review and ED provider handoff report. tpa given?: no, outside the window Premorbid modified Rankin scale (mRS): Unable to reliably ascertain-we will have to wait till a family member arrives to reliably ascertain this.  ROS: Unable to perform due to patient's aphasia  Past Medical History:  Diagnosis Date  . ETOH abuse   . Hypertension     Family History  Problem Relation Age of Onset  . Diabetes Mother   . Hypertension Mother   . Diabetes Father   . Hypertension Father     Social History:   reports that he has been smoking cigarettes. He has never used smokeless tobacco. He reports current alcohol use. No history on file for drug.  Medications No current facility-administered medications for this encounter.  Current Outpatient Medications:  .  amLODipine (NORVASC) 5 MG tablet, Take by mouth., Disp: , Rfl:   .  benzocaine (ORAJEL) 10 % mucosal gel, Use as directed 1 application in the mouth or throat as needed for pain., Disp: , Rfl:  .  HYDROcodone-acetaminophen (NORCO/VICODIN) 5-325 MG per tablet, Take 1-2 pills every 4-6 hours as needed for pain., Disp: 10 tablet, Rfl: 0 .  ibuprofen (ADVIL,MOTRIN) 800 MG tablet, Take 1 tablet (800 mg total) by mouth 3 (three) times daily., Disp: 21 tablet, Rfl: 0   Exam: Current vital signs: BP (!) 186/115   Pulse 74   Temp 98 F (36.7 C) (Oral)   Resp 20   Ht 5\' 4"  (1.626 m)   Wt 67.6 kg   SpO2 98%   BMI 25.58 kg/m  Vital signs in last 24 hours: Temp:  [98 F (36.7 C)-98.3 F (36.8 C)] 98 F (36.7 C) (05/07 1424) Pulse Rate:  [74-86] 74 (05/07 2100) Resp:  [18-25] 20 (05/07 2100) BP: (177-201)/(111-120) 186/115 (05/07 2100) SpO2:  [98 %-100 %] 98 % (05/07 2100) Weight:  [67.6 kg] 67.6 kg (05/07 1425) General: Awake alert in no distress HEENT: Normocephalic, atraumatic, supple neck CVs: Regular rate rhythm Abdomen soft nondistended nontender Extremities warm well perfused Neurological exam Awake alert oriented to self. Unable to tell his correct age Unable to tell me the correct month No dysarthria He is able to follow simple commands commands consistently.  He is not able to name objects consistently. His speech is fluent, with normal rate and intonation but is nearly incomprehensible with neologisms and frank word salad. Cranial nerves: Pupils are equal round  react light, extraocular movements intact, visual fields are full, face appears symmetric, tongue and palate are midline. Motor exam: No drift in any of the 4 extremities Sensory exam: Intact to light touch all over Coordination: No obvious dysmetria Gait testing deferred at this time NIH stroke scale 1a Level of Conscious.: 0 1b LOC Questions: 2 1c LOC Commands: 0 2 Best Gaze: 0 3 Visual: 0 4 Facial Palsy: 0 5a Motor Arm - left: 0 5b Motor Arm - Right: 0 6a Motor Leg -  Left: 0 6b Motor Leg - Right: 0 7 Limb Ataxia: 0 8 Sensory: 0 9 Best Language: 2 10 Dysarthria: 0 11 Extinct. and Inatten.: 0 TOTAL: 4  Labs I have reviewed labs in epic and the results pertinent to this consultation are: Ammonia 34.  CBC    Component Value Date/Time   WBC 5.2 10/02/2019 1436   RBC 5.01 10/02/2019 1436   HGB 13.8 10/02/2019 1436   HCT 40.9 10/02/2019 1436   PLT 200 10/02/2019 1436   MCV 81.6 10/02/2019 1436   MCH 27.5 10/02/2019 1436   MCHC 33.7 10/02/2019 1436   RDW 16.3 (H) 10/02/2019 1436   LYMPHSABS 1.6 10/02/2019 1436   MONOABS 0.7 10/02/2019 1436   EOSABS 0.1 10/02/2019 1436   BASOSABS 0.0 10/02/2019 1436    CMP     Component Value Date/Time   NA 136 10/02/2019 1436   K 4.0 10/02/2019 1436   CL 101 10/02/2019 1436   CO2 25 10/02/2019 1436   GLUCOSE 96 10/02/2019 1436   BUN 9 10/02/2019 1436   CREATININE 0.99 10/02/2019 1436   CALCIUM 9.0 10/02/2019 1436   PROT 7.4 10/02/2019 1436   ALBUMIN 4.0 10/02/2019 1436   AST 53 (H) 10/02/2019 1436   ALT 52 (H) 10/02/2019 1436   ALKPHOS 72 10/02/2019 1436   BILITOT 0.7 10/02/2019 1436   GFRNONAA >60 10/02/2019 1436   GFRAA >60 10/02/2019 1436  Urine toxicology screen negative Blood alcohol level less than 10. Ammonia level 34  Imaging I have reviewed the images obtained:  CT-scan of the brain and CT angiogram head and neck -moderate acute infarct in the left superior temporal and parietal cortex. No proximal branch occlusion or visible embolic source.  Cervical carotid atherosclerosis without flow-limiting stenosis seen.  Multiple remote bilateral lacunar infarcts.  MRI examination of the brain-recommended and pending  Assessment: 59 year old man with a past medical history of alcohol abuse and hypertension presenting to the emergency room for evaluation of 2 days worth of speech difficulty and confusion. On examination he has very obvious aphasia and CT scan confirms an acute stroke affecting  the Wernicke's area.  Not a candidate for TPA due to being outside the window Not a candidate for IR intervention due to no emergent LVO on vessel imaging.  Impression: Acute ischemic stroke-appears embolic but needs further work-up to look for source Aphasia  Recommendations: Admit to hospitalist Telemetry Frequent neurochecks Allow for permissive hypertension for another day or so-treating only if systolic blood pressures are greater than 220 on a as needed basis. Aspirin 325 Atorvastatin 80 Obtain MRI of the brain without contrast 2D echocardiogram Hemoglobin A1c Lipid panel PT OT Speech therapy Obtain more history from family when able to. Plan was discussed with Dr. Gerlene Fee in the emergency room. Stroke neurology will follow with you.  -- Amie Portland, MD [facility] Pager: [phone]

## 2019-10-02 NOTE — ED Notes (Signed)
Pt returned from CT, placed back on monitor & HOB adjusted for comfort.

## 2019-10-02 NOTE — ED Provider Notes (Signed)
[facility] Emergency Department Provider Note MRN:  [id number]  Arrival date & time: 10/02/19     Chief Complaint   Altered Mental Status   History of Present Illness   Arthur [name] is a 59 y.o. year-old male with a history of alcohol abuse, hypertension presenting to the ED with chief complaint of altered mental status.  Patient has been acting abnormally for the past 2 days.  Not talking correctly per wife.  Seems easily confused.  Cannot recall things.  No trauma, no chest pain, no headache, no abdominal pain, no numbness or weakness.  Review of Systems  A complete 10 system review of systems was obtained and all systems are negative except as noted in the HPI and PMH.   Patient's Health History    Past Medical History:  Diagnosis Date  . ETOH abuse   . Hypertension     Past Surgical History:  Procedure Laterality Date  . HEMORRHOID SURGERY      Family History  Problem Relation Age of Onset  . Diabetes Mother   . Hypertension Mother   . Diabetes Father   . Hypertension Father     Social History   Socioeconomic History  . Marital status: Married    Spouse name: Not on file  . Number of children: Not on file  . Years of education: Not on file  . Highest education level: Not on file  Occupational History  . Not on file  Tobacco Use  . Smoking status: Current Every Day Smoker    Types: Cigarettes  . Smokeless tobacco: Never Used  Substance and Sexual Activity  . Alcohol use: Yes  . Drug use: Not on file  . Sexual activity: Not on file  Other Topics Concern  . Not on file  Social History Narrative  . Not on file   Social Determinants of Health   Financial Resource Strain:   . Difficulty of Paying Living Expenses:   Food Insecurity:   . Worried About Charity fundraiser in the Last Year:   . Arboriculturist in the Last Year:   Transportation Needs:   . Film/video editor (Medical):   [name] Lack of Transportation (Non-Medical):    Physical Activity:   . Days of Exercise per Week:   . Minutes of Exercise per Session:   Stress:   . Feeling of Stress :   Social Connections:   . Frequency of Communication with Friends and Family:   . Frequency of Social Gatherings with Friends and Family:   . Attends Religious Services:   . Active Member of Clubs or Organizations:   . Attends Archivist Meetings:   [name] Marital Status:   Intimate Partner Violence:   . Fear of Current or Ex-Partner:   . Emotionally Abused:   [name] Physically Abused:   . Sexually Abused:      Physical Exam   Vitals:   10/02/19 1900 10/02/19 2100  BP: (!) 191/116 (!) 186/115  Pulse: 74 74  Resp: (!) 24 20  Temp:    SpO2: 99% 98%    CONSTITUTIONAL: Well-appearing, NAD NEURO: Alert, oriented to name, normal and symmetric strength and sensation, aphasia noted with attempting to name objects, attempting to repeat phrases.  Patient also has issues following simple commands such as finger-nose-finger testing EYES:  eyes equal and reactive ENT/NECK:  no LAD, no JVD CARDIO: Regular rate, well-perfused, normal S1 and S2 PULM:  CTAB no wheezing or  rhonchi GI/GU:  normal bowel sounds, non-distended, non-tender MSK/SPINE:  No gross deformities, no edema SKIN:  no rash, atraumatic PSYCH:  Appropriate speech and behavior  *Additional and/or pertinent findings included in MDM below  Diagnostic and Interventional Summary    EKG Interpretation  Date/Time:  Friday Oct 02 2019 14:22:03 EDT Ventricular Rate:  85 PR Interval:  144 QRS Duration: 78 QT Interval:  382 QTC Calculation: 454 R Axis:   44 Text Interpretation: Normal sinus rhythm Right atrial enlargement Left ventricular hypertrophy with repolarization abnormality ( R in aVL , Sokolow-Lyon , Cornell product , Romhilt-Estes ) T wave abnormality Abnormal ECG Confirmed by Gerhard Munch [phone]) on 10/02/2019 2:38:09 PM      Labs Reviewed  CBC - Abnormal; Notable for the following  components:      Result Value   RDW 16.3 (*)    All other components within normal limits  COMPREHENSIVE METABOLIC PANEL - Abnormal; Notable for the following components:   AST 53 (*)    ALT 52 (*)    All other components within normal limits  URINALYSIS, ROUTINE W REFLEX MICROSCOPIC - Abnormal; Notable for the following components:   Specific Gravity, Urine 1.032 (*)    Ketones, ur 5 (*)    Protein, ur 100 (*)    All other components within normal limits  RESPIRATORY PANEL BY RT PCR (FLU A&B, COVID)  PROTIME-INR  APTT  DIFFERENTIAL  ETHANOL  RAPID URINE DRUG SCREEN, HOSP PERFORMED  AMMONIA    CT Angio Head W or Wo Contrast  Final Result    CT Angio Neck W and/or Wo Contrast  Final Result      Medications  iohexol (OMNIPAQUE) 350 MG/ML injection 75 mL (75 mLs Intravenous Contrast Given 10/02/19 2006)     Procedures  /  Critical Care .Critical Care Performed by: Sabas Sous, MD Authorized by: Sabas Sous, MD   Critical care provider statement:    Critical care time (minutes):  33   Critical care was necessary to treat or prevent imminent or life-threatening deterioration of the following conditions:  CNS failure or compromise   Critical care was time spent personally by me on the following activities:  Discussions with consultants, evaluation of patient's response to treatment, examination of patient, ordering and performing treatments and interventions, ordering and review of laboratory studies, ordering and review of radiographic studies, pulse oximetry, re-evaluation of patient's condition, obtaining history from patient or surrogate and review of old charts    ED Course and Medical Decision Making  I have reviewed the triage vital signs, the nursing notes, and pertinent available records from the EMR.  Listed above are laboratory and imaging tests that I personally ordered, reviewed, and interpreted and then considered in my medical decision making (see below  for details).      Aphasia with last known normal 2 days ago, concern for acute ischemic stroke versus acute encephalopathy versus metabolic disarray.  Vital signs reassuring, no fever, no meningismus.  CTA revealing acute infarct of the left superior temporal and parietal cortex.  Discussed with Dr. Wilford Corner of neurology, who will evaluate.  Recommending MRI and hospitalist admission.   Elmer Sow. Pilar Plate, MD [facility] Emergency Medicine [facility] [email or web address]  Final Clinical Impressions(s) / ED Diagnoses     ICD-10-CM   1. Aphasia  R47.01   2. Acute ischemic stroke [facility])  I63.9     ED Discharge Orders    None  Discharge Instructions Discussed with and Provided to Patient:   Discharge Instructions   None       Sabas Sous, MD 10/02/19 2224

## 2019-10-02 NOTE — ED Notes (Addendum)
Patient is being discharged from the Urgent Care Center and sent to the Emergency Department via POV and family. Per Dr. Milus Glazier patient is stable but in need of higher level of care due to HTN, neurological impairment, CANNOT follow simple commands, memory impairment. Patient is aware and verbalizes understanding of plan of care.  Vitals:   10/02/19 1347  BP: (!) 201/111  Pulse: 83  Resp: 18  Temp: 98.3 F (36.8 C)  SpO2: 100%

## 2019-10-02 NOTE — ED Triage Notes (Signed)
Pt c/o HA, neck pain for approx 3 days with "difficulty remembering things." Wife states pt didn't remember his birthday yesterday. Pt has difficulty following simple commands, such as "use your hand to pull down your face mask."  Denies h/o drug use/ETOH; but wife states he does drink beer/hard liquor. Last consumption was yesterday per wife.   Reports sore throat, cough as well. Smile symmetrical, grips equal/strong.  Pt confused and states he is "at home" when asked status.  Dr. Milus Glazier for eval. He explained to wife/pt need to go to ER STAT for higher level eval/tx.

## 2019-10-02 NOTE — ED Notes (Signed)
Pt transported to CT via stretcher.  

## 2019-10-02 NOTE — H&P (Signed)
Date: 10/02/2019               Patient Name:  Arthur [name] MRN: [id number]  DOB: February 19, 1961 Age / Sex: 59 y.o., male   PCP: Patient, No Pcp Per         Medical Service: Internal Medicine Teaching Service         Attending Physician: Dr. [name], MD    First Contact: Dr. Dolan Amen Pager: [phone]  Second Contact: Dr. Lenward Chancellor Pager: [phone]       After Hours (After 5p/  First Contact Pager: [phone]  weekends / holidays): Second Contact Pager: [phone]   Chief Complaint: altered mental status  History of Present Illness:  Arthur [name] is a 59 year old M with significant PMH of hypertension and alcohol use disorder, who presented for altered mental status. Pt limited in ability to give reliable history so additional details supplemented per wife and chart review. Wife states he began gradually "acting strange" on Wednesday. This progressed on Thursday to were he was unable to answer questions regarding his birthday, number of children, or age of his grandchildren. He was also complaining of a headache that day and took two aspirin. Wife described patient driving the car erratically as well. Pt stuttering "like he was unable to get his words out" and "talking weird" per wife. She says "I figured he had a stroke." Earlier today the wife decided to bring him to an urgent care who referred him to the ED. Pt states he was brought to the ED "because of my drinking."  In the ED, there was concern for toxic encephalopathy given strong EtOH use history. EtOH, ammonia, and UDS all normal. BMP without metabolic derangement. CBC without leukocytosis or anemia. CTA of head and neck revealed moderate acute infarct in the left superior temporal and parietal cortex. Neurology consulted for acute stroke, and IMTS called for admission.   Meds:  Per wife, amlodipine 10mg  daily is patient's only medication  Allergies: Allergies as of 10/02/2019 - Review Complete 10/02/2019  Allergen  Reaction Noted  . Penicillins Hives 08/10/2008   Past Medical History:  Diagnosis Date  . ETOH abuse   . Hypertension    Pt's PMH remarkable for alcohol use disorder for many years. Wife states pt went to an alcohol treatment facility in [place] last year for a few months. Physicians there prescribed amlodipine 10mg  daily. Wife states prescription bottle is dated 11/20/2018 and pt has 13 pills left in the bottle. Pt began drinking again immediately have returning home.   Family History: Pt's mother and sister are diabetic. No known family history of strokes, heart disease, or hyperlipidemia.   Family History  Problem Relation Age of Onset  . Diabetes Mother   . Hypertension Mother   . Diabetes Father   . Hypertension Father    Social History:  Pt drinking EtOH since age of 15. Intermittent stays in treatment centers for weeks to months. Wife states when he comes home though he begins drinking again. It is hard for wife to quantify number of drinks per day, but thinks he may drink 4 beers, 0.5-1 pint of gin per day, and occasional additional mini bottles. Last drink was 1 beer yesterday. Pt smokes 4 black & mild cigars per day. No other history of substance use including marijuana, pills, cocaine, etc. Lives with wife and wife's son at home in [place]. Pt is unemployed. Is able to perform all of his own ADLs and  IADLs.   Social History   Tobacco Use  . Smoking status: Current Every Day Smoker    Types: Cigarettes  . Smokeless tobacco: Never Used  Substance Use Topics  . Alcohol use: Yes  . Drug use: Not on file   Review of Systems: Review of Systems  Constitutional: Negative for chills, diaphoresis, fever and malaise/fatigue.  HENT: Negative for congestion and sore throat.   Eyes:       + vision changes, pt states "black"  Respiratory: Negative for cough and shortness of breath.   Cardiovascular: Negative for chest pain, palpitations and leg swelling.  Gastrointestinal:  Negative for abdominal pain, constipation, diarrhea, nausea and vomiting.  Genitourinary: Negative for dysuria and frequency.  Musculoskeletal: Negative for joint pain and myalgias.  Skin: Negative.   Neurological: Negative for dizziness, sensory change, focal weakness, weakness and headaches.   Physical Exam: Blood pressure (!) 195/123, pulse 79, temperature 98 F (36.7 C), temperature source Oral, resp. rate 16, height 5\' 4"  (1.626 m), weight 67.6 kg, SpO2 98 %. Physical Exam Vitals and nursing note reviewed.  Constitutional:      General: He is not in acute distress.    Appearance: Normal appearance. He is normal weight. He is not ill-appearing.     Comments: Pleasant gentlemen laying back in stretcher.   HENT:     Head: Normocephalic and atraumatic.     Mouth/Throat:     Mouth: Mucous membranes are moist.     Pharynx: No oropharyngeal exudate.  Eyes:     Extraocular Movements: Extraocular movements intact.     Conjunctiva/sclera: Conjunctivae normal.     Pupils: Pupils are equal, round, and reactive to light.  Cardiovascular:     Rate and Rhythm: Normal rate and regular rhythm.     Heart sounds: Normal heart sounds. No murmur. No friction rub. No gallop.   Pulmonary:     Effort: Pulmonary effort is normal. No respiratory distress.     Breath sounds: Normal breath sounds. No wheezing, rhonchi or rales.  Abdominal:     General: Abdomen is flat. Bowel sounds are normal. There is no distension.     Palpations: Abdomen is soft.     Tenderness: There is no abdominal tenderness.  Musculoskeletal:        General: Normal range of motion.     Right lower leg: No edema.     Left lower leg: No edema.  Skin:    General: Skin is warm and dry.  Neurological:     Mental Status: He is alert.     Comments: Mental Status: Patient is awake, alert, oriented to person only. Able to name month and unable to name day of the week. Evidence of both expressive and receptive aphasia with word  finding difficulty and inability to follow multi-step commands. Pt refers to watch as "clock" and cannot identify a pen. No dysarthria.  No neglect. Cranial Nerves: II: Pupils equal, round, and reactive to light.  III,IV, VI: EOMI without ptosis or diploplia.  V: Facial sensation is symmetric tolight touch VII: Facial movement is symmetric.  VIII: Hearing is intact to voice X: Uvula elevates symmetrically XI: Pt unable to perform shoulder shrug XII: Tongue is midline without atrophy or fasciculations.  Motor: Good effort throughout with pt requiring instructions to be repeated multiple times, 5/5 bilateral UE, 5/5 bilateral lower extremitiy Sensory: Sensation is grossly intact in bilateral UEs & LEs Cerebellar: Pt unable to follow commands for Finger-Nose and Heel-Shin.  Psychiatric:  Mood and Affect: Mood normal. Affect is flat.    EKG: personally reviewed my interpretation is normal rate, sinus rhythm, RAD, LVH, and T wave inversions in inferolateral leads.   CTA Head and Neck IMPRESSION: 1. Moderate acute infarct in the left superior temporal and parietal cortex. 2. No proximal branch occlusion or visible embolic source. 3. Cervical carotid atherosclerosis without flow limiting stenosis. 4. Remote lacunar/perforator infarcts. 5. Motion degraded at the neck.  Assessment & Plan by Problem: Active Problems:   Altered mental status  Mr. Symonette is a 59 year old M with signficiant PMH of uncontrolled hypertension and alcohol use disorder, who presented with altered mental status and was found to have a moderate acute infarct in the left superior temporal and parietal cortex on CTA.   Acute CVA Pt presents for confusion and altered mental status. Last known normal 2 days ago. Outside of tpa window and not a candidate for IR intervention given no emergent large vessel occulusion on imaging. CT scan with acute stroke in Wernicke's area. Stroke risk factors include tobacco use,  hypertension, and gender. - deficits include obvious aphasia without cranial nerve, strength, or sensation deficits Neurology consulted, appreciate recommendations. - ASA 325 - atorvastatin 80 - MRI - echo - A1c and lipid panel - PT/OT/SLP - permissive hypertension for another day for SBP up to 220 - tele - q2h neuro checks  EtOH use disorder Pt with long standing history of EtOH use disorder for greater than 40 years. Wife denies history of withdrawal seizures. Last drink yesterday. EtOH on admission negative. Ammonia normal. LFTs within normal limits. No prior abdominal imaging to evaluate for cirrhosis, though exam reassuring without stigmata of chronic liver disease - CIWA - thiamine and folate  HTN On amlodipine 10mg  daily at home. Prescription filled almost one year ago so unclear how frequently pt taking this medication. He does not have a PCP. - holding amlodipine given permissive hypertension  Diet - NPO for swallow eval Fluids - none DVT ppx - SCDs CODE STATUS - FULL CODE  Dispo: Admit patient to Inpatient with expected length of stay greater than 2 midnights.  Signed: Ladona Horns, MD 10/02/2019, 11:54 PM  Pager: [phone]

## 2019-10-02 NOTE — ED Triage Notes (Signed)
Pt sent from UC for further evaluation of AMS and memory loss. Pt reports 2 days of neck pain "from my stroke". Pt unable to tell me the month or year. No neuro deficits noted in triage but pt seems disoriented.

## 2019-10-02 NOTE — ED Notes (Signed)
Pt transported to MRI via stretcher.  

## 2019-10-03 ENCOUNTER — Observation Stay (HOSPITAL_COMMUNITY): Payer: Self-pay

## 2019-10-03 ENCOUNTER — Other Ambulatory Visit (HOSPITAL_COMMUNITY): Payer: Self-pay

## 2019-10-03 ENCOUNTER — Other Ambulatory Visit: Payer: Self-pay

## 2019-10-03 DIAGNOSIS — I6389 Other cerebral infarction: Secondary | ICD-10-CM

## 2019-10-03 DIAGNOSIS — R4701 Aphasia: Secondary | ICD-10-CM

## 2019-10-03 LAB — COMPREHENSIVE METABOLIC PANEL
ALT: 49 U/L — ABNORMAL HIGH (ref 0–44)
AST: 54 U/L — ABNORMAL HIGH (ref 15–41)
Albumin: 3.7 g/dL (ref 3.5–5.0)
Alkaline Phosphatase: 68 U/L (ref 38–126)
Anion gap: 12 (ref 5–15)
BUN: 9 mg/dL (ref 6–20)
CO2: 25 mmol/L (ref 22–32)
Calcium: 9.1 mg/dL (ref 8.9–10.3)
Chloride: 100 mmol/L (ref 98–111)
Creatinine, Ser: 0.92 mg/dL (ref 0.61–1.24)
GFR calc Af Amer: >60 mL/min (ref >60)
GFR calc non Af Amer: >60 mL/min (ref >60)
Glucose, Bld: 79 mg/dL (ref 70–99)
Potassium: 3.8 mmol/L (ref 3.5–5.1)
Sodium: 137 mmol/L (ref 135–145)
Total Bilirubin: 1.1 mg/dL (ref 0.3–1.2)
Total Protein: 7.1 g/dL (ref 6.5–8.1)

## 2019-10-03 LAB — LIPID PANEL
Cholesterol: 227 mg/dL — ABNORMAL HIGH (ref 0–200)
HDL: 97 mg/dL (ref >40)
LDL Cholesterol: 116 mg/dL — ABNORMAL HIGH (ref 0–99)
Total CHOL/HDL Ratio: 2.3 RATIO
Triglycerides: 71 mg/dL (ref <150)
VLDL: 14 mg/dL (ref 0–40)

## 2019-10-03 LAB — ECHOCARDIOGRAM COMPLETE
Height: 64 in
Weight: 2384 oz

## 2019-10-03 LAB — HIV ANTIBODY (ROUTINE TESTING W REFLEX): HIV Screen 4th Generation wRfx: NONREACTIVE

## 2019-10-03 LAB — HEMOGLOBIN A1C
Hgb A1c MFr Bld: 5.4 % (ref 4.8–5.6)
Mean Plasma Glucose: 108.28 mg/dL

## 2019-10-03 LAB — CBC
HCT: 38.8 % — ABNORMAL LOW (ref 39.0–52.0)
Hemoglobin: 13.2 g/dL (ref 13.0–17.0)
MCH: 27.2 pg (ref 26.0–34.0)
MCHC: 34 g/dL (ref 30.0–36.0)
MCV: 80 fL (ref 80.0–100.0)
Platelets: 184 10*3/uL (ref 150–400)
RBC: 4.85 MIL/uL (ref 4.22–5.81)
RDW: 15.9 % — ABNORMAL HIGH (ref 11.5–15.5)
WBC: 4.7 10*3/uL (ref 4.0–10.5)
nRBC: 0 % (ref 0.0–0.2)

## 2019-10-03 LAB — TROPONIN I (HIGH SENSITIVITY)
Troponin I (High Sensitivity): 45 ng/L — ABNORMAL HIGH (ref <18)
Troponin I (High Sensitivity): 88 ng/L — ABNORMAL HIGH (ref <18)
Troponin I (High Sensitivity): 90 ng/L — ABNORMAL HIGH (ref <18)

## 2019-10-03 MED ORDER — ASPIRIN 81 MG PO CHEW
324.0000 mg | CHEWABLE_TABLET | Freq: Every day | ORAL | Status: DC
  Administered 2019-10-03 – 2019-10-05 (×4): 324 mg via ORAL
  Filled 2019-10-03 (×4): qty 4

## 2019-10-03 MED ORDER — ATORVASTATIN CALCIUM 80 MG PO TABS
80.0000 mg | ORAL_TABLET | Freq: Every day | ORAL | Status: DC
  Administered 2019-10-03 – 2019-10-05 (×4): 80 mg via ORAL
  Filled 2019-10-03 (×4): qty 1

## 2019-10-03 NOTE — Progress Notes (Signed)
Paging Dr. Chesley Mires about BP 199/114 and troponin levles never heard back from the Dr. Lyn Hollingshead.

## 2019-10-03 NOTE — Evaluation (Signed)
Physical Therapy Evaluation/ Discharge Patient Details Name: Arthur [name] MRN: [id number] DOB: 1960/09/16 Today's Date: 10/03/2019   History of Present Illness  59 yo admitted with confusion and aphasia with subacute Left MCA infarct. PMhx: HTN, EtOH abuse  Clinical Impression  Pt pleasant with noted word finding and command following deficits. Pt following 90% of multimodal commands but only grossly 15% for verbal commands. Pt able to provide information about home and setup but could not state what to do in an emergency. Pt presents with intact strength, sensation and function for mobility tasks performed but is impaired with cognition and speech deficits due to his aphasia. Pt reports 24hr assist of wife at home and that he does not work. If family able to provide supervision then return home is appropriate. Will defer further needs to OT and SLP acutely with pt aware and agreeable.   BP supine 199/114 and after gait 194/122 with Rn aware HR 74-82    Follow Up Recommendations Supervision/Assistance - 24 hour    Equipment Recommendations  None recommended by PT    Recommendations for Other Services OT consult;Speech consult     Precautions / Restrictions Precautions Precaution Comments: permissive HTN  SBP<220      Mobility  Bed Mobility Overal bed mobility: Independent                Transfers Overall transfer level: Independent                  Ambulation/Gait Ambulation/Gait assistance: Supervision Gait Distance (Feet): 350 Feet Assistive device: None Gait Pattern/deviations: WFL(Within Functional Limits)   Gait velocity interpretation: >2.62 ft/sec, indicative of community ambulatory General Gait Details: supervision for directional cues and safety, pt unable to recall room number or wayfind  Stairs Stairs: Yes Stairs assistance: Modified independent (Device/Increase time) Stair Management: One rail Right;Alternating pattern;Forwards Number of Stairs:  3 General stair comments: good safety with stairs  Wheelchair Mobility    Modified Rankin (Stroke Patients Only) Modified Rankin (Stroke Patients Only) Pre-Morbid Rankin Score: No symptoms Modified Rankin: Moderately severe disability     Balance Overall balance assessment: No apparent balance deficits (not formally assessed)                                           Pertinent Vitals/Pain Pain Assessment: No/denies pain    Home Living Family/patient expects to be discharged to:: Private residence Living Arrangements: Spouse/significant other Available Help at Discharge: Family;Available 24 hours/day Type of Home: House Home Access: Stairs to enter   [employer] of Steps: 2 Home Layout: One level Home Equipment: None      Prior Function Level of Independence: Independent               Hand Dominance        Extremity/Trunk Assessment   Upper Extremity Assessment Upper Extremity Assessment: Overall WFL for tasks assessed    Lower Extremity Assessment Lower Extremity Assessment: Overall WFL for tasks assessed    Cervical / Trunk Assessment Cervical / Trunk Assessment: Normal  Communication      Cognition Arousal/Alertness: Awake/alert Behavior During Therapy: WFL for tasks assessed/performed Overall Cognitive Status: Impaired/Different from baseline Area of Impairment: Following commands;Problem solving;Safety/judgement                       Following Commands: Follows one step commands inconsistently;Follows one  step commands with increased time Safety/Judgement: Decreased awareness of deficits   Problem Solving: Slow processing;Requires tactile cues        General Comments      Exercises     Assessment/Plan    PT Assessment Patent does not need any further PT services  PT Problem List         PT Treatment Interventions      PT Goals (Current goals can be found in the Care Plan section)  Acute  Rehab PT Goals PT Goal Formulation: All assessment and education complete, DC therapy    Frequency     Barriers to discharge        Co-evaluation               AM-PAC PT "6 Clicks" Mobility  Outcome Measure Help needed turning from your back to your side while in a flat bed without using bedrails?: None Help needed moving from lying on your back to sitting on the side of a flat bed without using bedrails?: None Help needed moving to and from a bed to a chair (including a wheelchair)?: None Help needed standing up from a chair using your arms (e.g., wheelchair or bedside chair)?: A Little Help needed to walk in hospital room?: A Little Help needed climbing 3-5 steps with a railing? : A Little 6 Click Score: 21    End of Session Equipment Utilized During Treatment: Gait belt Activity Tolerance: Patient tolerated treatment well Patient left: in chair;with call bell/phone within reach;with chair alarm set Nurse Communication: Mobility status PT Visit Diagnosis: Other symptoms and signs involving the nervous system (R29.898)    Time: 8938-1017 PT Time Calculation (min) (ACUTE ONLY): 22 min   Charges:   PT Evaluation $PT Eval Moderate Complexity: 1 Mod          Arthur [name], PT Acute Rehabilitation Services Pager: [phone] Office: [phone]   Arthur [name] Arthur [name] 10/03/2019, 12:46 PM

## 2019-10-03 NOTE — Evaluation (Deleted)
Speech Language Pathology Evaluation Patient Details Name: Arthur [name] MRN: [id number] DOB: March 19, 1961 Today's Date: 10/03/2019 Time: 9937-1696 SLP Time Calculation (min) (ACUTE ONLY): 12 min  Problem List:  Patient Active Problem List   Diagnosis Date Noted  . Altered mental status 10/02/2019  . Acute ischemic stroke (HCC) 10/02/2019   Past Medical History:  Past Medical History:  Diagnosis Date  . ETOH abuse   . Hypertension    Past Surgical History:  Past Surgical History:  Procedure Laterality Date  . HEMORRHOID SURGERY     HPI:  59 yo admitted with confusion and aphasia with subacute Left MCA infarct. PMhx: HTN, EtOH abuse.     Assessment / Plan / Recommendation Clinical Impression  Pt was seen for a bedside swallow evaluation and he presents with suspected functional oropharyngeal swallowing abilities.  Pt was encountered awake/alert with wife present at bedside and they denied hx of dysphagia.  Oral mechanism exam was unremarkable.  Pt consumed trials of thin liquid, puree, and regular solids.  Pt was able to feed himself independently and he demonstrated appropriate bolus acceptance, timely mastication, suspected timely AP transport/swallow initiation, and consistent hyolaryngeal elevation/excursion to observation and palpation.  No overt s/sx of aspiration were observed with any trials.  Recommend continuation of regular solids and thin liquids with medication administered whole with liquid or in puree (per pt preference).  No further skilled ST is warranted targeting dysphagia.     Of note, pt was observed  to have suspected expressive and receptive language deficits.  Pt would highly benefit from a formal cognitive-linguistic evaluation to further evaluate language abilities and to help with discharge planning given L MCA infarct.  Spoke with pt/wife and RN regarding all recommendations.      SLP Assessment  SLP Visit Diagnosis: Dysphagia, unspecified (R13.10)    Follow  Up Recommendations  None(for dysphagia )    Frequency and Duration           SLP Evaluation Cognition  Overall Cognitive Status: Impaired/Different from baseline       Comprehension       Expression     Oral / Motor  Oral Motor/Sensory Function Overall Oral Motor/Sensory Function: Within functional limits   GO                   Arthur Herb., M.S., CCC-SLP Acute Rehabilitation Services Office: [phone]  Shanon Rosser [facility] 10/03/2019, 2:53 PM

## 2019-10-03 NOTE — Progress Notes (Deleted)
PT Cancellation Note  Patient Details Name: Arthur [name] MRN: [id number] DOB: 03/26/1961   Cancelled Treatment:    Reason Eval/Treat Not Completed: Medical issues which prohibited therapy(BP 199/114 supine on arrival)   Tomika Eckles B Kaya Klausing 10/03/2019, 9:05 AM Merryl Hacker, PT Acute Rehabilitation Services Pager: [phone] Office: [phone]

## 2019-10-03 NOTE — Evaluation (Signed)
Clinical/Bedside Swallow Evaluation Patient Details  Name: Arthur [name] MRN: [id number] Date of Birth: 01-01-1961  Today's Date: 10/03/2019 Time: SLP Start Time (ACUTE ONLY): 1433 SLP Stop Time (ACUTE ONLY): 1445 SLP Time Calculation (min) (ACUTE ONLY): 12 min  Past Medical History:  Past Medical History:  Diagnosis Date  . ETOH abuse   . Hypertension    Past Surgical History:  Past Surgical History:  Procedure Laterality Date  . HEMORRHOID SURGERY     HPI:  59 yo admitted with confusion and aphasia with subacute Left MCA infarct. PMhx: HTN, EtOH abuse.     Assessment / Plan / Recommendation Clinical Impression  Pt was seen for a bedside swallow evaluation and he presents with suspected functional oropharyngeal swallowing abilities.  Pt was encountered awake/alert with wife present at bedside and they denied hx of dysphagia.  Oral mechanism exam was unremarkable.  Pt consumed trials of thin liquid, puree, and regular solids.  Pt was able to feed himself independently and he demonstrated appropriate bolus acceptance, timely mastication, suspected timely AP transport/swallow initiation, and consistent hyolaryngeal elevation/excursion to observation and palpation.  No overt s/sx of aspiration were observed with any trials.  Recommend continuation of regular solids and thin liquids with medication administered whole with liquid or in puree (per pt preference).  No further skilled ST is warranted targeting dysphagia at this time. Please re-consult if additional needs arise.     Of note, pt was observed to have suspected expressive and receptive language deficits.  Highly recommend a formal cognitive-linguistic evaluation to further evaluate the pt's language abilities and to help with discharge planning in the setting of a L MCA infarct.  Spoke with pt/wife and RN regarding all recommendations.    SLP Visit Diagnosis: Dysphagia, unspecified (R13.10)    Aspiration Risk  No limitations    Diet  Recommendation Regular;Thin liquid   Liquid Administration via: Cup;Straw Medication Administration: Whole meds with liquid Supervision: Patient able to self feed Compensations: Slow rate;Small sips/bites Postural Changes: Seated upright at 90 degrees    Other  Recommendations Recommended Consults: Other (Comment)(Consider a Cognitive-Linguistic evaluation ) Oral Care Recommendations: Oral care BID   Follow up Recommendations None(for dysphagia )        Swallow Study   General HPI: 59 yo admitted with confusion and aphasia with subacute Left MCA infarct. PMhx: HTN, EtOH abuse.   Type of Study: Bedside Swallow Evaluation Previous Swallow Assessment: None  Diet Prior to this Study: Regular;Thin liquids Temperature Spikes Noted: Yes Respiratory Status: Room air History of Recent Intubation: No Behavior/Cognition: Alert;Cooperative;Pleasant mood Oral Cavity Assessment: Within Functional Limits Oral Care Completed by SLP: No Oral Cavity - Dentition: Adequate natural dentition Vision: Functional for self-feeding Self-Feeding Abilities: Able to feed self Patient Positioning: Upright in bed Baseline Vocal Quality: Normal Volitional Swallow: Able to elicit    Oral/Motor/Sensory Function Overall Oral Motor/Sensory Function: Within functional limits   Ice Chips Ice chips: Not tested   Thin Liquid Thin Liquid: Within functional limits    Nectar Thick Nectar Thick Liquid: Not tested   Honey Thick Honey Thick Liquid: Not tested   Puree Puree: Within functional limits   Solid     Solid: Within functional limits     Villa Herb., M.S., CCC-SLP Acute Rehabilitation Services Office: [phone]  Shanon Rosser Lennex Pietila 10/03/2019,2:55 PM

## 2019-10-03 NOTE — Progress Notes (Signed)
Received Pt from 60M via bed by RN. Pt A&OX3 breathing equal and unlabored on 15L high flow oxygen. Pt denied pain. MAE well VS were taken and was placed on telemetry. POC reviewed with pt. Pt was oriented to room

## 2019-10-03 NOTE — Progress Notes (Signed)
  Echocardiogram 2D Echocardiogram has been performed.  Pieter Partridge 10/03/2019, 12:00 PM

## 2019-10-03 NOTE — Progress Notes (Signed)
STROKE TEAM PROGRESS NOTE   HISTORY OF PRESENT ILLNESS (per record) Arthur [name] is a 59 y.o. male past medical history of alcohol abuse and hypertension, brought in by family member to the emergency room for evaluation because of not acting right and acting confused. It is reported that he had been for the past 2 days not being able to talk correctly.  He cannot recall things and appeared confused.  Initially evaluated for confusion.  Given the strong alcohol abuse history, lab work and blood work performed and brain imaging ordered, MRI brain that was completed revealed an evolving left MCA territory infarct that is subacute. Neurological consultation obtained for further recommendations. I attempted to call the spouse, listed on the chart but was not able to speak with anyone as there was no answer on the other hand. Patient is unable to provide any reliable or coherent history at this time. LKW: 2 days ago per chart review and ED provider handoff report. tpa given?: no, outside the window Premorbid modified Rankin scale (mRS): Unable to reliably ascertain-we will have to wait till a family member arrives to reliably ascertain this.   INTERVAL HISTORY Patient reports that his speaking difficulties have resolved.  He thinks he is back at baseline.  The patient admits to drinking quite a bit and also and not monitoring his blood pressure.    OBJECTIVE Vitals:   10/03/19 1738 10/03/19 2210 10/04/19 0740 10/04/19 0930  BP: (!) 171/128 (!) 175/98 (!) 190/112 (!) 160/97  Pulse: 81 75 76 76  Resp: 17 18 18 12   Temp: 98.4 F (36.9 C) 98.4 F (36.9 C) 98 F (36.7 C)   TempSrc:  Oral Oral   SpO2: 99% 100% 100%   Weight:      Height:        CBC:  Recent Labs  Lab 10/02/19 1436 10/03/19 0222  WBC 5.2 4.7  NEUTROABS 2.8  --   HGB 13.8 13.2  HCT 40.9 38.8*  MCV 81.6 80.0  PLT 200 562    Basic Metabolic Panel:  Recent Labs  Lab 10/02/19 1436 10/03/19 0222  NA 136 137  K 4.0  3.8  CL 101 100  CO2 25 25  GLUCOSE 96 79  BUN 9 9  CREATININE 0.99 0.92  CALCIUM 9.0 9.1    Lipid Panel:     Component Value Date/Time   CHOL 227 (H) 10/03/2019 0222   TRIG 71 10/03/2019 0222   HDL 97 10/03/2019 0222   CHOLHDL 2.3 10/03/2019 0222   VLDL 14 10/03/2019 0222   LDLCALC 116 (H) 10/03/2019 0222   HgbA1c:  Lab Results  Component Value Date   HGBA1C 5.4 10/03/2019   Urine Drug Screen:     Component Value Date/Time   LABOPIA NONE DETECTED 10/02/2019 2100   COCAINSCRNUR NONE DETECTED 10/02/2019 2100   LABBENZ NONE DETECTED 10/02/2019 2100   AMPHETMU NONE DETECTED 10/02/2019 2100   THCU NONE DETECTED 10/02/2019 2100   LABBARB NONE DETECTED 10/02/2019 2100    Alcohol Level     Component Value Date/Time   ETH <10 10/02/2019 1843    IMAGING  CT Angio Head W or Wo Contrast CT Angio Neck W and/or Wo Contrast 10/02/2019 IMPRESSION:  1. Moderate acute infarct in the left superior temporal and parietal cortex.  2. No proximal branch occlusion or visible embolic source.  3. Cervical carotid atherosclerosis without flow limiting stenosis.  4. Remote lacunar/perforator infarcts.  5. Motion degraded at the neck.  MR BRAIN WO CONTRAST 10/03/2019 IMPRESSION:  1. Moderate sized acute ischemic posterior left MCA territory infarct involving the left temporoparietal region. No associated hemorrhage or mass effect.  2. Underlying moderate chronic microvascular ischemic disease with multiple chronic lacunar infarcts involving the right basal ganglia and thalami.   Transthoracic Echocardiogram  10/03/2019 IMPRESSIONS  1. Left ventricular ejection fraction, by estimation, is 55 to 60%. The  left ventricle has normal function. The left ventricle has no regional  wall motion abnormalities. There is mild left ventricular hypertrophy.  Left ventricular diastolic parameters  are consistent with Grade I diastolic dysfunction (impaired relaxation).  2. Right ventricular  systolic function is normal. The right ventricular  size is normal.  3. The mitral valve is normal in structure. Trivial mitral valve  regurgitation. No evidence of mitral stenosis.  4. The aortic valve is normal in structure. Aortic valve regurgitation is  not visualized. No aortic stenosis is present.   ECG - SR rate 85  BPM. (See cardiology reading for complete details)    PHYSICAL EXAM Blood pressure (!) 160/97, pulse 76, temperature 98 F (36.7 C), temperature source Oral, resp. rate 12, height 5\' 4"  (1.626 m), weight 67.6 kg, SpO2 100 %. GENERAL: He is doing well at this time.  HEENT: Normal  ABDOMEN: soft  EXTREMITIES: No edema   BACK: Normal  SKIN: Normal by inspection.    MENTAL STATUS: Alert and oriented. Speech, language (good fluency, naming and comprehension) and cognition are generally intact. Judgment and insight normal.   CRANIAL NERVES: Pupils are equal, round and reactive to light and accomodation; extra ocular movements are full, there is no significant nystagmus; visual fields are full; upper and lower facial muscles are normal in strength and symmetric, there is no flattening of the nasolabial folds; tongue is midline; uvula is midline; shoulder elevation is normal.  MOTOR: Normal tone, bulk and strength; no pronator drift.  COORDINATION: Left finger to nose is normal, right finger to nose is normal, No rest tremor; no intention tremor; no postural tremor; no bradykinesia.  SENSATION: Normal to light touch, temperature, and pain.         ASSESSMENT/PLAN [name] is a 59 y.o. male with history of alcohol abuse, tobacco use and hypertension, brought in by family member to the emergency room for evaluation because of AMS and speech difficulties. He did not receive IV t-PA due to late presentation (>4.5 hours from time of onset).  This appears to be a cryptogenic stroke and 30-day cardiac event monitor and/or loop recorder is  recommended.  Stroke: acute infarct in the left superior temporal and parietal cortex - likely embolic - etiology unknown.  Resultant aphasia although this appears to have resolved  Code Stroke CT Head - not ordered   CT head - not ordered  MRI head - Moderate sized acute ischemic posterior left MCA territory infarct involving the left temporoparietal region. No associated hemorrhage or mass effect. Underlying moderate chronic microvascular ischemic disease with multiple chronic lacunar infarcts involving the right basal ganglia and thalami.  MRA head - not ordered  CTA Head - Moderate acute infarct in the left superior temporal and parietal cortex. No proximal branch occlusion or visible embolic source. Cervical carotid atherosclerosis without flow limiting stenosis. Remote lacunar/perforator infarcts.   CTA Neck - Motion degraded at the neck.   CT Perfusion - not ordered  Carotid Doppler - CTA neck performed - carotid dopplers not indicated.  2D Echo - EF 55 to 60%.  No cardiac source of emboli identified.   Sars Corona Virus 2 - negative  LDL - 116  HgbA1c - 5.4  UDS - negative  VTE prophylaxis - Lovenox Diet  Diet Order            Diet Heart Room service appropriate? Yes; Fluid consistency: Thin  Diet effective now              No antithrombotic prior to admission, now on aspirin 325 mg daily.  Plavix will be added for 4 weeks.  Patient counseled to be compliant with his antithrombotic medications  Ongoing aggressive stroke risk factor management  Therapy recommendations:  No f/u PT or speech therapy recommended. Supervision initially.  Disposition:  Pending  Hypertension  Home BP meds: Norvasc  Current BP meds: none   Blood pressure somewhat high at times but within post stroke/TIA parameters . Permissive hypertension (OK if < 220/120) but gradually normalize in 5-7 days  . Long-term BP goal normotensive . Blood pressure monitoring at home is  recommended and discussed with the patient.  Hyperlipidemia  Home Lipid lowering medication: none   LDL 116, goal < 70  Current lipid lowering medication: Lipitor 80 mg daily  Continue statin at discharge  Other Stroke Risk Factors  Cigarette smoker - advised to stop smoking  ETOH use, advised to drink no more than 1 alcoholic beverage per day.  This is discussed at length with the patient  Remote lacunar/perforator infarcts by imaging.  Other Active Problems  Code status - Full code  ETOH hx   Hospital day # 1    To contact Stroke Continuity provider, please refer to [email or web address]. After hours, contact General Neurology

## 2019-10-03 NOTE — Progress Notes (Signed)
   Subjective:  Patient was evaluated at bedside this morning. He states that he is doing well, and knows that he had a stroke. He knows that today is Saturday, he is at [facility] cone, and is in [place]. He is able to recognize a pen and what it is used for. Has difficulty pronouncing watch, and is unable to identify an ID badge. We discussed our treatment plan, and following up with the rest of his stroke workup. He did ask about drinking water, we discussed waiting for his speech and swallow evaluation, to make sure he can swallow. He voices understanding.   Able to follow one step commands.   Objective:  Vital signs in last 24 hours: Vitals:   10/03/19 0123 10/03/19 0151 10/03/19 0158 10/03/19 0424  BP: (!) 191/107 (!) 194/103  (!) 164/105  Pulse: 65 68  77  Resp: 18 19  17   Temp: 98.9 F (37.2 C) 98.8 F (37.1 C)  99.7 F (37.6 C)  TempSrc: Oral Oral  Oral  SpO2: 100% 100% 100% 100%  Weight:      Height:       General: awake, alert, lying comfortably in bed in NAD Neuro: A&Ox3; cannot name objects consistently; no dysarthria; can follow simple commands. Cranial nerves intact. Motor and sensory intact throughout.   Assessment/Plan:  Active Problems:   Altered mental status   Acute ischemic stroke [facility])  Arthur [name] is a 59 year old M with signficiant PMH of uncontrolled hypertension and alcohol use disorder, who presented with altered mental status and was found to have a moderate acute infarct in the left superior temporal and parietal cortex on CTA.   Acute CVA Pt presented with confusion and altered mental status. Last known normal 48 hours prior to presentation so he was outside the tpa window. Also not a candidate for IR intervention given no emergent large vessel occulusion on imaging. Stroke risk factors include tobacco use, hypertension, and gender. - continues to have word finding difficulties as primary deficit - MRI confirmed moderate sized acute ischemic posterior  left MCA infarct without hemorrhage or mass effect  - ASA 325 - atorvastatin 80 - echo pending - A1c normal at 5.4 - appreciate PT/OT/SLP evals  - permissive hypertension for total of 48 hours; only treat SBP > 220 - tele monitoring reviewed; no signs of arrhythmia; continue to monitor  - continue frequent neuro checks   EtOH use disorder - no elevated CIWA scores thus far; continue to monitor  - thiamine and folate  HTN - On amlodipine 10mg  daily at home - holding amlodipine given permissive hypertension  Prior to Admission Living Arrangement: home Anticipated Discharge Location: pending PT/OT assessments Barriers to Discharge: continued medical work-up  Dispo: Anticipated discharge in approximately 1 day(s).   59 D, DO 10/03/2019, 5:53 AM Pager: [phone]

## 2019-10-03 NOTE — Progress Notes (Signed)
CRITICAL VALUE ALERT  Critical Value: Troponin 88  Date & Time Notied: 10/03/2019 @0745   Provider Notified:Dr.   Paged awaiting called back.  Orders Received/Actions taken: Paged awaiting called back.

## 2019-10-04 MED ORDER — AMLODIPINE BESYLATE 5 MG PO TABS
5.0000 mg | ORAL_TABLET | Freq: Every day | ORAL | Status: DC
  Administered 2019-10-04: 5 mg via ORAL
  Filled 2019-10-04: qty 1

## 2019-10-04 MED ORDER — AMLODIPINE BESYLATE 5 MG PO TABS
5.0000 mg | ORAL_TABLET | Freq: Once | ORAL | Status: AC
  Administered 2019-10-05: 5 mg via ORAL
  Filled 2019-10-04: qty 1

## 2019-10-04 MED ORDER — CLOPIDOGREL BISULFATE 75 MG PO TABS
75.0000 mg | ORAL_TABLET | Freq: Every day | ORAL | Status: DC
  Administered 2019-10-05: 75 mg via ORAL
  Filled 2019-10-04: qty 1

## 2019-10-04 MED ORDER — AMLODIPINE BESYLATE 10 MG PO TABS
10.0000 mg | ORAL_TABLET | Freq: Every day | ORAL | Status: DC
  Administered 2019-10-04 – 2019-10-05 (×2): 10 mg via ORAL
  Filled 2019-10-04 (×2): qty 1

## 2019-10-04 NOTE — Plan of Care (Signed)

## 2019-10-04 NOTE — Progress Notes (Signed)
Called MD regarding high BP this morning: 190/112. Amlodipine added by MD.

## 2019-10-04 NOTE — Evaluation (Signed)
Occupational Therapy Evaluation Patient Details Name: Arthur [name] MRN: [id number] DOB: May 08, 1961 Today's Date: 10/04/2019    History of Present Illness 59 yo admitted with confusion and aphasia with subacute Left MCA infarct. PMhx: HTN, EtOH abuse   Clinical Impression   Pt PTA: Pt living with spouse and reports independence with ADL and mobility. Pt currently with focal deficits in memory, awareness of deficits, sequencing and higher level cognition. Pt attempting trail making task in hallway, but pt easily distractible and requiring cues to attend to task and recall current task. Pt supervisionA for ADL due to cognitive deficits. Pt continues to have aphasia vs dysarthria - either choosing the wrong word or unable to say it correctly during session. Pt would benefit from continued OT skilled services for higher level cognition in OP OT setting. Pt advised to have 24/7 supervision at home. OT following.    Follow Up Recommendations  Outpatient OT;Supervision/Assistance - 24 hour(OP OT for cognition- could be addressed by SLP)    Equipment Recommendations  None recommended by OT    Recommendations for Other Services       Precautions / Restrictions Precautions Precaution Comments: permissive HTN <220 Restrictions Weight Bearing Restrictions: No      Mobility Bed Mobility Overal bed mobility: Independent                Transfers Overall transfer level: Independent                    Balance Overall balance assessment: No apparent balance deficits (not formally assessed)                                         ADL either performed or assessed with clinical judgement   ADL Overall ADL's : Needs assistance/impaired Eating/Feeding: Modified independent;Sitting   Grooming: Supervision/safety;Standing Grooming Details (indicate cue type and reason): cues to find soap and to sequence through brushing teeth Upper Body Bathing: Supervision/  safety;Standing Upper Body Bathing Details (indicate cue type and reason): cues to find soap Lower Body Bathing: Supervison/ safety;Sitting/lateral leans;Sit to/from stand   Upper Body Dressing : Set up;Standing   Lower Body Dressing: Supervision/safety;Sitting/lateral leans;Sit to/from stand   Toilet Transfer: Supervision/safety;Ambulation   Toileting- Clothing Manipulation and Hygiene: Supervision/safety       Functional mobility during ADLs: Supervision/safety General ADL Comments: Pt supervisionA for all ADL due to impairments in cognition for sequencing and finding items. Pt went through entire ADL bin to find soap instead of using the soap provided on the wall to wash hands. Pt requires cues for assist.     Vision Baseline Vision/History: No visual deficits Patient Visual Report: No change from baseline Vision Assessment?: No apparent visual deficits Additional Comments: Ptperforming trail making task in hallway and 75% successful with cues.     Perception     Praxis      Pertinent Vitals/Pain Pain Assessment: No/denies pain     Hand Dominance Right   Extremity/Trunk Assessment Upper Extremity Assessment Upper Extremity Assessment: Overall WFL for tasks assessed   Lower Extremity Assessment Lower Extremity Assessment: Overall WFL for tasks assessed   Cervical / Trunk Assessment Cervical / Trunk Assessment: Normal   Communication Communication Communication: Expressive difficulties(word finding difficulty)   Cognition Arousal/Alertness: Awake/alert Behavior During Therapy: WFL for tasks assessed/performed Overall Cognitive Status: Impaired/Different from baseline Area of Impairment: Following commands;Problem solving;Safety/judgement  Following Commands: Follows one step commands consistently;Follows multi-step commands inconsistently;Follows multi-step commands with increased time Safety/Judgement: Decreased awareness of  deficits   Problem Solving: Slow processing;Requires tactile cues General Comments: Pt unable to recall room number stating '10' instead of '2'; pt able to follow commands well with multi step commands- delayed processing and cues to assist. Pt able to recall 1/3 items immediately and then after 5 mins. Pt still with dysarthria calling soap 'soak' and shoes 'sores.' Pt a/o x4. Pt unable to recall 'BEFAST' acronym after education.    General Comments  Pt advised to have 24/7 supervision as pt is not able to manage own self without someone providing cues for success of task. Pt forgetful. Pt  did answer "call 911 in case of emergency" and "you stop at a red light" when asked about safety.    Exercises     Shoulder Instructions      Home Living Family/patient expects to be discharged to:: Private residence Living Arrangements: Spouse/significant other Available Help at Discharge: Family;Available 24 hours/day Type of Home: House Home Access: Stairs to enter [employer] of Steps: 2   Home Layout: One level     Bathroom Shower/Tub: Chief Strategy Officer: Standard     Home Equipment: None          Prior Functioning/Environment Level of Independence: Independent                 OT Problem List: Decreased cognition;Decreased safety awareness      OT Treatment/Interventions: Self-care/ADL training;Therapeutic exercise;Neuromuscular education;Therapeutic activities;Cognitive remediation/compensation;Balance training;Patient/family education    OT Goals(Current goals can be found in the care plan section) Acute Rehab OT Goals Patient Stated Goal: to go home OT Goal Formulation: With patient Time For Goal Achievement: 10/18/19 Potential to Achieve Goals: Good ADL Goals Additional ADL Goal #1: Pt will perform (2) multistep commands with no cues to sequence through task in prep for safe return home. Additional ADL Goal #2: Pt will perform OOB ADL with  modified independence with no cues to assist with finding item nor to recall current task. Additional ADL Goal #3: Pt able to recall "BEFAST"/signs and symptoms of CVA in prep for safety with return home.  OT Frequency: Min 2X/week   Barriers to D/C:            Co-evaluation              AM-PAC OT "6 Clicks" Daily Activity     Outcome Measure Help from another person eating meals?: None Help from another person taking care of personal grooming?: A Little Help from another person toileting, which includes using toliet, bedpan, or urinal?: A Little Help from another person bathing (including washing, rinsing, drying)?: A Little Help from another person to put on and taking off regular upper body clothing?: None Help from another person to put on and taking off regular lower body clothing?: A Little 6 Click Score: 20   End of Session Equipment Utilized During Treatment: Gait belt Nurse Communication: Mobility status  Activity Tolerance: Patient tolerated treatment well Patient left: in chair;with call bell/phone within reach  OT Visit Diagnosis: Other symptoms and signs involving cognitive function;Cognitive communication deficit (R41.841) Symptoms and signs involving cognitive functions: Cerebral infarction                Time: 1137-1202 OT Time Calculation (min): 25 min Charges:  OT General Charges $OT Visit: 1 Visit OT Evaluation $OT Eval Moderate Complexity: 1 Mod OT  Treatments $Self Care/Home Management : 8-22 mins  Flora Lipps, OTR/L Acute Rehabilitation Services Pager: [phone] Office: [phone]   Maryama Kuriakose C 10/04/2019, 2:05 PM

## 2019-10-04 NOTE — Progress Notes (Addendum)
   Subjective:   Pt seen at the bedside this AM.  Patient has no complaints this morning.  He states that he feels like he is speaking much better, and no longer having difficulty finding words.  Denies having any numbness or weakness.  No complaints at this time.  Objective:  Vital signs in last 24 hours: Vitals:   10/03/19 0904 10/03/19 0934 10/03/19 1738 10/03/19 2210  BP: (!) 199/114 (!) 169/122 (!) 171/128 (!) 175/98  Pulse:  79 81 75  Resp:  17 17 18   Temp:  98 F (36.7 C) 98.4 F (36.9 C) 98.4 F (36.9 C)  TempSrc:    Oral  SpO2:  100% 99% 100%  Weight:      Height:       Physical Exam General: Well-appearing, NAD HEENT: PERRLA, EOMI NEURO:Cranial nerves II through XII grossly intact.  Alert and oriented x3, nonfocal, strength 5/5 in all extremities  Assessment/Plan:  Active Problems:   Altered mental status   Acute ischemic stroke (HCC)   Aphasia  In summary, Arthur [name] is a 59 year old M with signficiant PMH of uncontrolled hypertension and alcohol use disorder, who presented with altered mental status and was found to have a moderate acute infarct in the left superior temporal and parietal cortex on CTA.   #L MCA CVA: Pt presented with confusion and altered mental status. Last known normal 48 hours prior to presentation so he was outside the tPA or IR window. MRI confirmed moderate sized acute ischemic posterior left MCA infarct without hemorrhage or mass effect, affecting the superior temporal and parietal lobes. Ct angio neck did not show signficant stenosis of the carotids.  Echocardiogram was unremarkable. -Appreciate Neurology recommendations  - Aspirin 325 mg daily - Atorvastatin 80 mg daily - A1c normal at 5.4 - Appreciate PT/OT/SLP evals  -SLP cognitive eval ordered - Telemetry - continue frequent neuro checks   #EtOH use disorder -CIWA without Ativan -Thiamine 100 mg daily -Folic acid 1 mg daily  #HTN: Takes amlodipine 10 mg daily at home. This  was being held in the setting of permissive HTN. Currently 48 hrs post CVA -Restart Amlodipine 10 mg daily   #FEN/GI -Diet: Heart healthy -Fluids: None -Senna tablet nightly PRN  #DVT prophylaxis -Lovenox 40 mg subq injections daily  #Dispo: Anticipated discharge in approximately today or tomorrow Prior to Admission Living Arrangement: home Anticipated Discharge Location: pending OT assessment  Barriers to Discharge: continued medical work-up   59, MD Internal Medicine, PGY1 Pager: [phone]  10/04/2019,6:53 AM

## 2019-10-04 NOTE — Progress Notes (Signed)
Pt is alert times 4 but has a problem with recalling the things. Pt is independent to most of his activities. Denies pain. Ambulated in a hallway twice. Wife is in bed side, BP is under control most of the day after resuming back his home HTN meds, will continue to monitor  Lonia Farber, RN

## 2019-10-04 NOTE — Plan of Care (Signed)
  Problem: Education: Goal: Knowledge of General Education information will improve Description Including pain rating scale, medication(s)/side effects and non-pharmacologic comfort measures Outcome: Progressing   

## 2019-10-05 DIAGNOSIS — I639 Cerebral infarction, unspecified: Secondary | ICD-10-CM

## 2019-10-05 LAB — CBC
HCT: 38.1 % — ABNORMAL LOW (ref 39.0–52.0)
Hemoglobin: 13 g/dL (ref 13.0–17.0)
MCH: 27.6 pg (ref 26.0–34.0)
MCHC: 34.1 g/dL (ref 30.0–36.0)
MCV: 80.9 fL (ref 80.0–100.0)
Platelets: 187 10*3/uL (ref 150–400)
RBC: 4.71 MIL/uL (ref 4.22–5.81)
RDW: 15.4 % (ref 11.5–15.5)
WBC: 5.4 10*3/uL (ref 4.0–10.5)
nRBC: 0 % (ref 0.0–0.2)

## 2019-10-05 LAB — BASIC METABOLIC PANEL
Anion gap: 8 (ref 5–15)
BUN: 15 mg/dL (ref 6–20)
CO2: 27 mmol/L (ref 22–32)
Calcium: 9.1 mg/dL (ref 8.9–10.3)
Chloride: 101 mmol/L (ref 98–111)
Creatinine, Ser: 1.06 mg/dL (ref 0.61–1.24)
GFR calc Af Amer: >60 mL/min (ref >60)
GFR calc non Af Amer: >60 mL/min (ref >60)
Glucose, Bld: 111 mg/dL — ABNORMAL HIGH (ref 70–99)
Potassium: 3.8 mmol/L (ref 3.5–5.1)
Sodium: 136 mmol/L (ref 135–145)

## 2019-10-05 MED ORDER — ASPIRIN EC 81 MG PO TBEC: 81.0000 mg | DELAYED_RELEASE_TABLET | Freq: Every day | ORAL | 0 refills | Status: DC

## 2019-10-05 MED ORDER — LISINOPRIL 5 MG PO TABS: 5.0000 mg | ORAL_TABLET | Freq: Every day | ORAL | 0 refills | Status: DC

## 2019-10-05 MED ORDER — ATORVASTATIN CALCIUM 80 MG PO TABS: 80.0000 mg | ORAL_TABLET | Freq: Every day | ORAL | 0 refills | Status: DC

## 2019-10-05 MED ORDER — CLOPIDOGREL BISULFATE 75 MG PO TABS: 75.0000 mg | ORAL_TABLET | Freq: Every day | ORAL | 0 refills | Status: DC

## 2019-10-05 MED ORDER — THIAMINE HCL 100 MG PO TABS: 100.0000 mg | ORAL_TABLET | Freq: Every day | ORAL | 0 refills | Status: DC

## 2019-10-05 MED ORDER — FOLIC ACID 1 MG PO TABS: 1.0000 mg | ORAL_TABLET | Freq: Every day | ORAL | 0 refills | Status: DC

## 2019-10-05 MED ORDER — LISINOPRIL 5 MG PO TABS: 5.0000 mg | ORAL_TABLET | Freq: Every day | ORAL | Status: DC

## 2019-10-05 NOTE — Progress Notes (Addendum)
   Subjective:   Pt seen at the bedside this AM. Patient states that he is doing well this morning. He denies any headaches, nausea, chest pain, or paraesthesias. He is amendable to DC today, and to follow up and establish care in the IMTS clinic.   Objective:  Vital signs in last 24 hours: Vitals:   10/04/19 0930 10/04/19 1502 10/04/19 2200 10/05/19 0445  BP: (!) 160/97 (!) 157/105 (!) 156/108 (!) 170/115  Pulse: 76 78 73 65  Resp: 12 13 20    Temp:  98 F (36.7 C) 98.3 F (36.8 C) 98.9 F (37.2 C)  TempSrc:  Oral Oral Oral  SpO2:  100% 100% 100%  Weight:      Height:       Physical Exam General: Well appearing HEENT: NCAT, EOMI NEURO: 5/5 strength in bilateral U&L extremities, sensation grossly intact  Assessment/Plan:  Active Problems:   Altered mental status   Acute ischemic stroke (HCC)   Aphasia  In summary, Arthur [name] is a 59 year old M with signficiant PMH of uncontrolled hypertension and alcohol use disorder, who presented with altered mental status and was found to have a moderate acute infarct in the left superior temporal and parietal cortex on CTA. Patient is medically stable for discharge.  #L MCA CVA: Pt presented with confusion and altered mental status. Last known normal 48 hours prior to presentation so he was outside the tPA or IR window. MRI confirmed moderate sized acute ischemic posterior left MCA infarct without hemorrhage or mass effect, affecting the superior temporal and parietal lobes. Ct Angio neck did not show signficant stenosis of the carotids.  Echocardiogram was unremarkable. -Appreciate Neurology recommendations.  - Aspirin 81 mg and Plavix 75 mg daily for 4 weeks followed by aspirin alone  - Atorvastatin 80 mg daily - A1c normal at 5.4 - Appreciate PT/OT/SLP evals  - Telemetry -F/u in IMTS clinic this week  #EtOH use disorder -CIWA without Ativan -Thiamine 100 mg daily -Folic acid 1 mg daily  #HTN: Takes amlodipine 10 mg daily at  home. This was being held in the setting of permissive HTN. Goal to obtain normotensive BP in the next 7 days. -Amlodipine 10 mg daily   #FEN/GI -Diet: Heart healthy -Fluids: None -Senna tablet nightly PRN  #DVT prophylaxis -Lovenox 40 mg subq injections daily  #Dispo: Anticipated discharge in approximately today or tomorrow Prior to Admission Living Arrangement: home Anticipated Discharge Location: pending OT assessment  Barriers to Discharge: continued medical work-up   61, MD Internal Medicine, PGY1 Pager: [phone]  10/05/2019,6:58 AM

## 2019-10-05 NOTE — TOC Progression Note (Signed)
Transition of Care [facility]) - Progression Note    Patient Details  Name: Arthur [name] MRN: [id number] Date of Birth: [birth year]  [facility] CM/SW [place], [place] Phone Number: 10/05/2019, 10:45 AM  Clinical Narrative:      Met with pt for discharge plans. Pt doesn't have PCP and needs med assistance. Pt is okay with recommendation for OP OT; CSW sent referral for [facility]. Patient is enrolled in [facility] program. CSW schedules PCP appointment for patient at [facility] for 11/10/19 at 920am.   No other needs identified. TOC sign off for now.       Expected Discharge Plan and Services           Expected Discharge Date: 10/05/19                                     Social Determinants of Health (SDOH) Interventions    Readmission Risk Interventions No flowsheet data found.

## 2019-10-05 NOTE — Social Work (Signed)
CSW met with pt at bedside. CSW introduced self and explained her role. CSW completed sbirt with pt. Pt scored a 21 on the sbirt scale. Pt stated that he drinks daily and has about 5-6 drinks a day. Pt stated last week he would have said no he did not have a problem with alcohol use but today e recognizes that he does have a problem with his alcohol use. Pt denies substance use.   CSW offered resources. Pt was receptive to resources. CSW and pt reviewed outpatient, residential, and detox programs. Pt stated he has to get into a program because he needs help stopping his alcohol use.   Arthur [name], Latanya Presser, [place] Social Worker [phone]

## 2019-10-05 NOTE — Plan of Care (Signed)
Care plan goals met. Pt adequate for discharge.  

## 2019-10-05 NOTE — Progress Notes (Signed)
STROKE TEAM PROGRESS NOTE       INTERVAL HISTORY Patient reports that his speaking difficulties have resolved.  He thinks he is back at baseline.  The patient admits to drinking and smoking quite a bit and also and not monitoring his blood pressure.  He is willing to consider cutting back and quitting.  He is being discharged home today    OBJECTIVE Vitals:   10/04/19 1502 10/04/19 2200 10/05/19 0445 10/05/19 0725  BP: (!) 157/105 (!) 156/108 (!) 170/115 (!) 176/112  Pulse: 78 73 65 67  Resp: 13 20  16   Temp: 98 F (36.7 C) 98.3 F (36.8 C) 98.9 F (37.2 C) 97.8 F (36.6 C)  TempSrc: Oral Oral Oral   SpO2: 100% 100% 100% 100%  Weight:      Height:        CBC:  Recent Labs  Lab 10/02/19 1436 10/02/19 1436 10/03/19 0222 10/05/19 0224  WBC 5.2   < > 4.7 5.4  NEUTROABS 2.8  --   --   --   HGB 13.8   < > 13.2 13.0  HCT 40.9   < > 38.8* 38.1*  MCV 81.6   < > 80.0 80.9  PLT 200   < > 184 187   < > = values in this interval not displayed.    Basic Metabolic Panel:  Recent Labs  Lab 10/03/19 0222 10/05/19 0224  NA 137 136  K 3.8 3.8  CL 100 101  CO2 25 27  GLUCOSE 79 111*  BUN 9 15  CREATININE 0.92 1.06  CALCIUM 9.1 9.1    Lipid Panel:     Component Value Date/Time   CHOL 227 (H) 10/03/2019 0222   TRIG 71 10/03/2019 0222   HDL 97 10/03/2019 0222   CHOLHDL 2.3 10/03/2019 0222   VLDL 14 10/03/2019 0222   LDLCALC 116 (H) 10/03/2019 0222   HgbA1c:  Lab Results  Component Value Date   HGBA1C 5.4 10/03/2019   Urine Drug Screen:     Component Value Date/Time   LABOPIA NONE DETECTED 10/02/2019 2100   COCAINSCRNUR NONE DETECTED 10/02/2019 2100   LABBENZ NONE DETECTED 10/02/2019 2100   AMPHETMU NONE DETECTED 10/02/2019 2100   THCU NONE DETECTED 10/02/2019 2100   LABBARB NONE DETECTED 10/02/2019 2100    Alcohol Level     Component Value Date/Time   ETH <10 10/02/2019 1843    IMAGING  CT Angio Head W or Wo Contrast CT Angio Neck W and/or Wo  Contrast 10/02/2019 IMPRESSION:  1. Moderate acute infarct in the left superior temporal and parietal cortex.  2. No proximal branch occlusion or visible embolic source.  3. Cervical carotid atherosclerosis without flow limiting stenosis.  4. Remote lacunar/perforator infarcts.  5. Motion degraded at the neck.   MR BRAIN WO CONTRAST 10/03/2019 IMPRESSION:  1. Moderate sized acute ischemic posterior left MCA territory infarct involving the left temporoparietal region. No associated hemorrhage or mass effect.  2. Underlying moderate chronic microvascular ischemic disease with multiple chronic lacunar infarcts involving the right basal ganglia and thalami.   Transthoracic Echocardiogram  10/03/2019 IMPRESSIONS   1. Left ventricular ejection fraction, by estimation, is 55 to 60%. The  left ventricle has normal function. The left ventricle has no regional  wall motion abnormalities. There is mild left ventricular hypertrophy.  Left ventricular diastolic parameters  are consistent with Grade I diastolic dysfunction (impaired relaxation).   2. Right ventricular systolic function is normal. The right ventricular  size  is normal.   3. The mitral valve is normal in structure. Trivial mitral valve  regurgitation. No evidence of mitral stenosis.   4. The aortic valve is normal in structure. Aortic valve regurgitation is  not visualized. No aortic stenosis is present.   ECG - SR rate 85  BPM. (See cardiology reading for complete details)    PHYSICAL EXAM Blood pressure (!) 176/112, pulse 67, temperature 97.8 F (36.6 C), resp. rate 16, height 5\' 4"  (1.626 m), weight 67.6 kg, SpO2 100 %. GENERAL: He is doing well at this time.  HEENT: Normal  ABDOMEN: soft  EXTREMITIES: No edema   BACK: Normal  SKIN: Normal by inspection.    MENTAL STATUS: Alert and oriented. Speech, language (good fluency, naming and comprehension) and cognition are generally intact. Judgment and insight normal.    CRANIAL NERVES: Pupils are equal, round and reactive to light and accomodation; extra ocular movements are full, there is no significant nystagmus; visual fields are full; upper and lower facial muscles are normal in strength and symmetric, there is no flattening of the nasolabial folds; tongue is midline; uvula is midline; shoulder elevation is normal.  MOTOR: Normal tone, bulk and strength; no pronator drift.  COORDINATION: Left finger to nose is normal, right finger to nose is normal, No rest tremor; no intention tremor; no postural tremor; no bradykinesia.  SENSATION: Normal to light touch, temperature, and pain.         ASSESSMENT/PLAN [name] is a 59 y.o. male with history of alcohol abuse, tobacco use and hypertension, brought in by family member to the emergency room for evaluation because of AMS and speech difficulties. He did not receive IV t-PA due to late presentation (>4.5 hours from time of onset).  This appears to be a cryptogenic stroke and 30-day cardiac event monitor and/or loop recorder is recommended.  Stroke: acute infarct in the left superior temporal and parietal cortex - likely embolic - etiology unknown.  Resultant aphasia although this appears to have resolved Code Stroke CT Head - not ordered  CT head - not ordered MRI head - Moderate sized acute ischemic posterior left MCA territory infarct involving the left temporoparietal region. No associated hemorrhage or mass effect. Underlying moderate chronic microvascular ischemic disease with multiple chronic lacunar infarcts involving the right basal ganglia and thalami. MRA head - not ordered CTA Head - Moderate acute infarct in the left superior temporal and parietal cortex. No proximal branch occlusion or visible embolic source. Cervical carotid atherosclerosis without flow limiting stenosis. Remote lacunar/perforator infarcts.  CTA Neck - Motion degraded at the neck.  CT Perfusion - not  ordered Carotid Doppler - CTA neck performed - carotid dopplers not indicated. 2D Echo - EF 55 to 60%.  No cardiac source of emboli identified.  Sars Corona Virus 2 - negative LDL - 116 HgbA1c - 5.4 UDS - negative VTE prophylaxis - Lovenox Diet  Diet Order             Diet - low sodium heart healthy        Diet Heart Room service appropriate? Yes; Fluid consistency: Thin  Diet effective now               No antithrombotic prior to admission, now on aspirin 325 mg daily.  Plavix will be added for 4 weeks. Patient counseled to be compliant with his antithrombotic medications Ongoing aggressive stroke risk factor management Therapy recommendations:  No f/u PT or speech therapy recommended. Supervision  initially. Disposition:  Pending  Hypertension Home BP meds: Norvasc Current BP meds: none  Blood pressure somewhat high at times but within post stroke/TIA parameters Permissive hypertension (OK if < 220/120) but gradually normalize in 5-7 days  Long-term BP goal normotensive Blood pressure monitoring at home is recommended and discussed with the patient.  Hyperlipidemia Home Lipid lowering medication: none  LDL 116, goal < 70 Current lipid lowering medication: Lipitor 80 mg daily Continue statin at discharge  Other Stroke Risk Factors Cigarette smoker - advised to stop smoking ETOH use, advised to drink no more than 1 alcoholic beverage per day.  This is discussed at length with the patient Remote lacunar/perforator infarcts by imaging.  Other Active Problems Code status - Full code ETOH hx Patient is being discharged  today.  May consider outpatient 30-day monitor on follow-up visit.  Stroke team will sign off.  Kindly call for questions. Delia Heady, MD    Phone [phone] Fax [phone]  Hospital day # 2    To contact Stroke Continuity provider, please refer to [email or web address]. After hours, contact General Neurology

## 2019-10-05 NOTE — Progress Notes (Signed)
Patient was stable at discharge. I removed his IV. We reviewed the discharge education. Patient and his daughter verbalized understanding and had no further questions. Patient left with prescriptions in hand.

## 2019-10-05 NOTE — Discharge Summary (Signed)
Name: Arthur [name] MRN: [id number] DOB: May 22, 1961 59 y.o. PCP: Patient, No Pcp Per  Date of Admission: 10/02/2019  2:19 PM Date of Discharge: 10/05/2019 Attending Physician: Velna Ochs, MD  Discharge Diagnosis: 1. CVA 2. HTN 3. EtOH use disorder  Discharge Medications: Allergies as of 10/05/2019      Reactions   Penicillins Hives      Medication List    STOP taking these medications   HYDROcodone-acetaminophen 5-325 MG tablet Commonly known as: NORCO/VICODIN   ibuprofen 800 MG tablet Commonly known as: ADVIL     TAKE these medications   amLODipine 10 MG tablet Commonly known as: NORVASC Take 10 mg by mouth daily.   aspirin EC 81 MG tablet Take 1 tablet (81 mg total) by mouth daily.   atorvastatin 80 MG tablet Commonly known as: LIPITOR Take 1 tablet (80 mg total) by mouth daily.   clopidogrel 75 MG tablet Commonly known as: PLAVIX Take 1 tablet (75 mg total) by mouth daily.   folic acid 1 MG tablet Commonly known as: FOLVITE Take 1 tablet (1 mg total) by mouth daily.   lisinopril 5 MG tablet Commonly known as: ZESTRIL Take 1 tablet (5 mg total) by mouth daily.   thiamine 100 MG tablet Take 1 tablet (100 mg total) by mouth daily.      Disposition and follow-up:   Arthur [name] was discharged from [facility] in Stable condition.  At the hospital follow up visit please address:  1. CVA: Moderate sized acute ischemic posterior left MCA territory infarct involving the left temporoparietal region. Deficits mostly in cognitive and language related.  -Asprin 81 mg daily and Plavix 75 mg for 4 weeks followed by aspirin alone -F/u with Neurology  -PT/OT/SLP all recommend outpatient follow up with all services.  2. HTN: Patient was permissively hypertensive during his hospitalization.  We started amlodipine 10 mg and lisinopril 5 mg with a goal of normalizing his blood pressure within a week. -Amlodipine 10 mg daily -Lisinopril 5 mg  daily, check BMP at f/u wto monitor renal function -Goal for normotensive blood pressure is within a week of discharge  3. EtOH use disorder: Drinks 5-6 drinks a day but hasn't had issues with withdrawal during hosptilization. -Counsel on EtOH cessation, may benefit from naltrexone.   4.  Labs / imaging needed at time of follow-up: BMP  5.  Pending labs/ test needing follow-up: None  Follow-up Appointments: Follow-up Theresa. Go on 11/10/2019.   Specialty: Internal Medicine Why: Appointment Scheduled for 11/10/19 at 920 am Contact information: [place] [phone]         Ref Range & Units 3 d ago   Cholesterol 0 - 200 mg/dL 227High    Triglycerides <150 mg/dL 71   HDL >40 mg/dL 97   Total CHOL/HDL Ratio RATIO 2.3   VLDL 0 - 40 mg/dL 14   LDL Cholesterol 0 - 99 mg/dL 116High     HGB A1c = 5.4  MRI Brain: IMPRESSION: 1. Moderate sized acute ischemic posterior left MCA territory infarct involving the left temporoparietal region. No associated hemorrhage or mass effect. 2. Underlying moderate chronic microvascular ischemic disease with multiple chronic lacunar infarcts involving the right basal ganglia and thalami.  CT Angio Head and Neck: IMPRESSION: 1. Moderate acute infarct in the left superior temporal and parietal cortex. 2. No proximal branch occlusion or visible embolic source. 3.  Cervical carotid atherosclerosis without flow limiting stenosis. 4. Remote lacunar/perforator infarcts. 5. Motion degraded at the neck.  Echocardiogram: IMPRESSIONS  1. Left ventricular ejection fraction, by estimation, is 55 to 60%. The  left ventricle has normal function. The left ventricle has no regional  wall motion abnormalities. There is mild left ventricular hypertrophy.  Left ventricular diastolic parameters  are consistent with Grade I diastolic dysfunction (impaired relaxation).  2.  Right ventricular systolic function is normal. The right ventricular  size is normal.  3. The mitral valve is normal in structure. Trivial mitral valve  regurgitation. No evidence of mitral stenosis.  4. The aortic valve is normal in structure. Aortic valve regurgitation is  not visualized. No aortic stenosis is present.   Hospital Course by problem list: 1. CVA 2. HTN 3. EtOH use disorder  In summary, Arthur [name] is a 59 year old male with a past medical history significant for uncontrolled hypertension, call use disorder, and tobacco use disorder who presented with AMS.  His wife expressed that the patient was acting strange, then subsequently was unable to answer simple questions regarding his birthday, the number of children he had at age of his grandchildren.  He was taken to the ED for further work-up.  His last known normal was outside of the window to receive any TPA or intervention.  MRI of the brain demonstrated a moderate sized acute ischemic posterior left MCA territory infarct involving the left temporoparietal region.  Secondary stroke work-up was initiated.  Patient received a CT angio of the head and neck which did not show flow-limiting stenosis of his carotid arteries.  Echocardiogram was unremarkable.  Neurology followed the patient and recommended starting aspirin 81 mg daily and Plavix 75 mg daily for 4 weeks followed by aspirin alone.  He was also initiated on atorvastatin 80 mg daily.  The patient's deficits are mostly cognitive and language related.  PT/OT/SLP did their evaluations, and all recommend outpatient follow-up.  The patient was permissively hypertensive during his hospitalization, and after 48 hours after his presentation was started back on his home amlodipine 10 mg daily.  He was also started on lisinopril 5 mg daily the day of discharge with a plan to follow-up in the IMTS clinic.  Goal is to have him normotensive within a week from discharge.  Discharge Vitals:     BP (!) 176/112 (BP Location: Right Arm)   Pulse 67   Temp 97.8 F (36.6 C)   Resp 16   Ht 5\' 4"  (1.626 m)   Wt 67.6 kg   SpO2 100%   BMI 25.58 kg/m   Pertinent Labs, Studies, and Procedures:  CBC Latest Ref Rng & Units 10/05/2019 10/03/2019 10/02/2019  WBC 4.0 - 10.5 K/uL 5.4 4.7 5.2  Hemoglobin 13.0 - 17.0 g/dL 12/02/2019 29.7 98.9  Hematocrit 39.0 - 52.0 % 38.1(L) 38.8(L) 40.9  Platelets 150 - 400 K/uL 187 184 200   CMP Latest Ref Rng & Units 10/05/2019 10/03/2019 10/02/2019  Glucose 70 - 99 mg/dL 12/02/2019) 79 96  BUN 6 - 20 mg/dL 15 9 9   Creatinine 0.61 - 1.24 mg/dL 941(D 4.08  Sodium 135 - 145 mmol/L 136 137 136  Potassium 3.5 - 5.1 mmol/L 3.8 3.8 4.0  Chloride 98 - 111 mmol/L 101 100 101  CO2 22 - 32 mmol/L 27 25 25   Calcium 8.9 - 10.3 mg/dL 9.1 9.1 9.0  Total Protein 6.5 - 8.1 g/dL - 7.1 7.4  Total Bilirubin 0.3 - 1.2 mg/dL - 1.1  0.7  Alkaline Phos 38 - 126 U/L - 68 72  AST 15 - 41 U/L - 54(H) 53(H)  ALT 0 - 44 U/L - 49(H) 52(H)   Discharge Instructions: Discharge Instructions    Ambulatory referral to Physical Therapy   Complete by: As directed    Call MD for:  difficulty breathing, headache or visual disturbances   Complete by: As directed    Call MD for:  extreme fatigue   Complete by: As directed    Call MD for:  hives   Complete by: As directed    Call MD for:  persistant dizziness or light-headedness   Complete by: As directed    Call MD for:  persistant nausea and vomiting   Complete by: As directed    Call MD for:  severe uncontrolled pain   Complete by: As directed    Call MD for:  temperature >100.4   Complete by: As directed    Diet - low sodium heart healthy   Complete by: As directed    Discharge instructions   Complete by: As directed    Thank you for allowing [place] to take care of you during your hospitalization.  Below is a summary of what we treated:  1.  Stroke -Please take aspirin 81 mg and Plavix 75 mg daily for the next 4 weeks.  After this, you  can take aspirin alone daily.  2.  Hypertension -Take amlodipine 10 mg daily.  Our goal is to normalize her blood pressures over the next week. -It is imperative that you follow-up with your primary care provider to help manage your blood pressure and other medications.  3.  Tobacco use -Please try your best to stay away from cigarettes or other tobacco products.  These products increase your risk of having another stroke.  4.  Follow-up -Please follow-up with internal medicine clinic. We scheduled an appointment for you on 5/13 10:15 AM. Call [phone] for details.   Increase activity slowly   Complete by: As directed      Signed: Kirt Boys, MD Internal Medicine, PGY1 Pager: [phone]  10/06/2019,10:38 AM

## 2019-10-05 NOTE — Evaluation (Signed)
Speech Language Pathology Evaluation Patient Details Name: Arthur [name] MRN: [id number] DOB: 08-26-1960 Today's Date: 10/05/2019 Time: 7902-4097 SLP Time Calculation (min) (ACUTE ONLY): 13 min  Problem List:  Patient Active Problem List   Diagnosis Date Noted  . Aphasia   . Altered mental status 10/02/2019  . Acute ischemic stroke (HCC) 10/02/2019   Past Medical History:  Past Medical History:  Diagnosis Date  . ETOH abuse   . Hypertension    Past Surgical History:  Past Surgical History:  Procedure Laterality Date  . HEMORRHOID SURGERY     HPI:  59 yo admitted with confusion and aphasia with subacute Left MCA infarct. PMhx: HTN, EtOH abuse   Assessment / Plan / Recommendation Clinical Impression  Pt exhibits mild aphasia and cognitive deficits impacting ability to perform activities of daily living independently and safely. Verbal expression fluent in short conversation with dysnomia and phonemic paraphasias with inconsistent awareness of errors. He can follow commands, respond appropriately to basic verbal problem solving, write name and address and generate 8 items in a category. He stated solutions to various problem scenarios involving safety. He is able to express his intended message approximately 70% of the time but has difficulty with specific ideas and details. He would benefit from a cognitive standpoint as well focusing on anticipatory and safety awareness. Pt needs 24 hour supervision initially and ST recommending continued ST at outpatient. Plans are discharge today therefore will defer goals to outpatient ST after their assessment.       SLP Assessment  SLP Recommendation/Assessment: All further Speech Lanaguage Pathology  needs can be addressed in the next venue of care SLP Visit Diagnosis: Aphasia (R47.01)    Follow Up Recommendations  Outpatient SLP    Frequency and Duration           SLP Evaluation Cognition  Overall Cognitive Status: Impaired/Different  from baseline Arousal/Alertness: Awake/alert Attention: Sustained Sustained Attention: Appears intact Memory: (need to assess further next venue care) Awareness: Impaired Awareness Impairment: Anticipatory impairment Problem Solving: (intact for basic) Safety/Judgment: Impaired       Comprehension  Auditory Comprehension Overall Auditory Comprehension: (functional for basic) Visual Recognition/Discrimination Discrimination: Not tested Reading Comprehension Reading Status: (TBA)    Expression Expression Primary Mode of Expression: Verbal Verbal Expression Overall Verbal Expression: Impaired Initiation: No impairment Level of Generative/Spontaneous Verbalization: Conversation Naming: Impairment Responsive: Not tested Divergent: 75-100% accurate Verbal Errors: Phonemic paraphasias;Other (comment)(inconsistent awareness) Pragmatics: No impairment Written Expression Dominant Hand: Right Written Expression: (functional for name and address)   Oral / Motor  Oral Motor/Sensory Function Overall Oral Motor/Sensory Function: Within functional limits Motor Speech Overall Motor Speech: Appears within functional limits for tasks assessed Intelligibility: Intelligible Motor Planning: Witnin functional limits   GO                    Royce Macadamia 10/05/2019, 11:17 AM  Breck Coons Lonell Face.Ed Nurse, children's [phone] Office [phone]

## 2019-10-08 ENCOUNTER — Encounter: Payer: Self-pay | Admitting: Internal Medicine

## 2019-10-08 ENCOUNTER — Ambulatory Visit (INDEPENDENT_AMBULATORY_CARE_PROVIDER_SITE_OTHER): Payer: Self-pay | Admitting: Internal Medicine

## 2019-10-08 ENCOUNTER — Other Ambulatory Visit: Payer: Self-pay | Admitting: *Deleted

## 2019-10-08 ENCOUNTER — Other Ambulatory Visit: Payer: Self-pay

## 2019-10-08 VITALS — BP 142/92 | HR 73 | Temp 99.0°F | Ht 64.0 in | Wt 149.0 lb

## 2019-10-08 DIAGNOSIS — I639 Cerebral infarction, unspecified: Secondary | ICD-10-CM

## 2019-10-08 DIAGNOSIS — I10 Essential (primary) hypertension: Secondary | ICD-10-CM

## 2019-10-08 MED ORDER — LISINOPRIL 10 MG PO TABS: 10.0000 mg | ORAL_TABLET | Freq: Every day | ORAL | 0 refills | Status: DC

## 2019-10-08 MED ORDER — ASPIRIN EC 81 MG PO TBEC: 81.0000 mg | DELAYED_RELEASE_TABLET | Freq: Every day | ORAL | 0 refills | Status: DC

## 2019-10-08 MED ORDER — LISINOPRIL 5 MG PO TABS: 5.0000 mg | ORAL_TABLET | Freq: Every day | ORAL | 0 refills | Status: DC

## 2019-10-08 MED ORDER — AMLODIPINE BESYLATE 10 MG PO TABS: 10.0000 mg | ORAL_TABLET | Freq: Every day | ORAL | 0 refills | Status: DC

## 2019-10-08 MED ORDER — ATORVASTATIN CALCIUM 80 MG PO TABS: 80.0000 mg | ORAL_TABLET | Freq: Every day | ORAL | 0 refills | Status: DC

## 2019-10-08 NOTE — Assessment & Plan Note (Signed)
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 Patient had cognitive and language (slurred speech) related deficits.  He was told to take aspirin 81 mg daily, Plavix 75 mg for 4 weeks followed by aspirin alone.  He is also to follow-up with neurology, PT, OT, SLP outpatient.  Assessment and plan  Patient continues to have cognitive slowing and difficulty answering basic questions.  No cranial nerve deficits, weakness or sensory deficits.  Prior to the stroke he was working at [employer] on the [employer] and he had 1/10 grade level of education.  -Continue aspirin and Plavix -Continue atorvastatin 80 mg daily -Reminded patient to follow-up with neurology, PT, OT, SLP

## 2019-10-08 NOTE — Patient Instructions (Signed)
It was a pleasure to see you today Arthur [name]. Please make the following changes:  -follow up with neurology  -follow up with physical therapy, occupational therapy, speech therapy  -get covid vaccine  -increase lisinopril to 10mg  daily -take medications on this form  If you have any questions or concerns, please call our clinic at [phone] between 9am-5pm and after hours call [phone] and ask for the internal medicine resident on call. If you feel you are having a medical emergency please call 911.   Thank you, we look forward to help you remain healthy!  [phone], MD Internal Medicine PGY3   To schedule an appointment for a COVID vaccine or be added to the vaccine wait list: Go to [employer]   OR Go to [email or web address]                  OR Call [phone]                                     OR Call [phone] and select Option 2

## 2019-10-08 NOTE — Progress Notes (Addendum)
   CC: Follow up for left MCA Ischemic stroke   HPI:  [name] is a 59 y.o. with hypertension, alcohol use disorder who presented for hospital follow-up of a left MCA territory ischemic stroke. Please see problem based charting for evaluation, assessment, and plan.  Past Medical History:  Diagnosis Date  . ETOH abuse   . Hypertension    Social History   Socioeconomic History  . Marital status: Married    Spouse name: Not on file  . Number of children: Not on file  . Years of education: Not on file  . Highest education level: Not on file  Occupational History  . Not on file  Tobacco Use  . Smoking status: Former Smoker    Years: 20.00    Types: Cigarettes, Cigars    Quit date: 09/30/2019    Years since quitting: 0.0  . Smokeless tobacco: Never Used  Substance and Sexual Activity  . Alcohol use: Yes  . Drug use: Never  . Sexual activity: Not on file  Other Topics Concern  . Not on file  Social History Narrative  . Not on file   Social Determinants of Health   Financial Resource Strain:   . Difficulty of Paying Living Expenses:   Food Insecurity:   . Worried About Programme researcher, broadcasting/film/video in the Last Year:   . Barista in the Last Year:   Transportation Needs:   . Freight forwarder (Medical):   [name] Lack of Transportation (Non-Medical):   Physical Activity:   . Days of Exercise per Week:   . Minutes of Exercise per Session:   Stress:   . Feeling of Stress :   Social Connections:   . Frequency of Communication with Friends and Family:   . Frequency of Social Gatherings with Friends and Family:   . Attends Religious Services:   . Active Member of Clubs or Organizations:   . Attends Banker Meetings:   [name] Marital Status:    Family History  Problem Relation Age of Onset  . Diabetes Mother   . Hypertension Mother   . Diabetes Father   . Hypertension Father   . Hypertension Sister    Review of Systems:    Denies nausea, vomiting,  chest pain, fevers or chills.  Physical Exam:  Vitals:   10/08/19 1114  BP: (!) 142/92  Pulse: 73  Temp: 99 F (37.2 C)  TempSrc: Oral    Physical Exam  Constitutional: He is oriented to person, place, and time. He appears well-developed and well-nourished. No distress.  HENT:  Head: Normocephalic and atraumatic.  Cardiovascular: Normal rate, regular rhythm and normal heart sounds.  Respiratory: Effort normal and breath sounds normal. No respiratory distress. He has no wheezes.  GI: Soft. Bowel sounds are normal. He exhibits no distension. There is no abdominal tenderness.  Musculoskeletal:        General: No edema.  Neurological: He is alert and oriented to person, place, and time. No cranial nerve deficit. Coordination normal.  Slowed thought process Unable to spell world backwards Able to identify (with assistance) where items of clothing are supposed to be worn on body   Skin: He is not diaphoretic.   Assessment & Plan:   See Encounters Tab for problem based charting.  Patient discussed with Dr. Sandre Kitty

## 2019-10-08 NOTE — Patient Outreach (Signed)
[facility]) Care Management  10/08/2019  Arthur [name] 1960-12-19 [id number]   RED ON EMMI ALERT - Stroke Day # 1 Date: 5/12 Red Alert Reason: No follow up appointment scheduled, Unfilled prescriptions, Don't know how/when to take meds, and Problems setting up rehab   Outreach attempt #1.  Male answering listed preferred number [phone]) report wrong number.  Call placed to listed home number [phone]), male answering phone report she is his wife but they just arrived at the MD office for visit, unable to talk at this time.   Plan: RN CM will follow up with member within the next 3-4 business days.  Kemper Durie, [place], MSN [facility] Care Management  [facility] Manager [phone]

## 2019-10-08 NOTE — Assessment & Plan Note (Signed)
Patient's blood pressure during this visit was 142/92. The patient is currently taking amlodipine 10 mg daily, lisinopril 5 mg daily.  Assessment and plan The patient's blood pressure during this visit was elevated and therefore we will increase lisinopril dose.  The patient is also supposed to continue amlodipine.   -Increase lisinopril from 5mg  to 10mg  daily -follow up in 4 weeks

## 2019-10-13 NOTE — Progress Notes (Signed)
Internal Medicine Clinic Attending  Case discussed with Dr. Chundi at the time of the visit.  We reviewed the resident's history and exam and pertinent patient test results.  I agree with the assessment, diagnosis, and plan of care documented in the resident's note.  Darl Brisbin, M.D., Ph.D.  

## 2019-10-14 ENCOUNTER — Other Ambulatory Visit: Payer: Self-pay | Admitting: *Deleted

## 2019-10-14 NOTE — Patient Outreach (Signed)
[facility]) Care Management  10/14/2019  PATE AYLWARD [birth year] [id number]    RED ON EMMI ALERT - Stroke Day # 1 Date: 5/12 Red Alert Reason: No follow up appointment scheduled, Unfilled prescriptions, Don't know how/when to take meds, and Problems setting up rehab  Outreach attempt #2, successful, identity verified.  This care manager introduced self and stated purpose of call.  [facility] care management services explained.  Significant other present during conversation to help provide information.  He report he is doing well, denies any weakness from stroke.  Has not had to use walker/cane since discharge, state he is able to walk independently.  Able to perform all ADL's alone, significant other helps to make sure his medications and MD visits are coordinated.  Confirms that he does have all medications that were prescribed, denies having questions about administration.  He did have follow up appointment with Internal Medicine Clinic on 5/13, will be seen on 6/9 for financial counseling as he does not have insurance.  Noted that he has [facility] with [facility] to establish new PCP on 6/15, they would prefer he stay at the [facility] for his care going forward.  No appointment scheduled with neurology, report he is unsure who to schedule appointment with.  Agrees to have this care manager follow up with [facility] regarding follow up with PCP and neurology.  Denies any urgent concerns at this time.   Plan: RN CM will follow up within the next week.  Kemper Durie, [place], MSN [facility] Care Management  [facility] Manager [phone]

## 2019-10-15 ENCOUNTER — Telehealth: Payer: Self-pay | Admitting: Internal Medicine

## 2019-10-15 ENCOUNTER — Other Ambulatory Visit: Payer: Self-pay | Admitting: *Deleted

## 2019-10-15 NOTE — Patient Outreach (Signed)
[facility]) Care Management  10/15/2019  Arthur [name] [birth year] [id number]   Call placed to internal medicine clinic to follow up on establishing member with new PCP at that office versus the [facility] as scheduled on 6/15.  Notified by office that member has been assigned to PCP with the Internal Medicine Clinic, will have appointment with financial advisor on 6/9 to discuss payment for office visits and hospitalization.  Inquired about follow up with neurology, notified that appointment may not have been scheduled due to lack of insurance and needing to pay up front.  Also notified that there was an order for home health evaluation for PT/OT/ST.  They will re-evaluate members case and reorder the neurology and [facility] referrals.    Call placed to member/wife to provide update on appointments and referrals.  She verbalizes understanding, agree that visit with neurology is important but they are not able to pay for services.  She will await call back from [facility], will plan to attend financial counseling session on 6/9.  Appointment with [facility] canceled per [facility] request as member will be seen at the [facility] for primary care.  Will follow up within the next 2 weeks as planned.  Kemper Durie, [place], MSN [facility] Care Management  [facility] Manager [phone]

## 2019-10-15 NOTE — Telephone Encounter (Signed)
Rec'd call from [facility] with [facility] to f//u with this patient.  This pt does not have a rtn appt with our clinic.  Please advise when this pt should f/u.  Also, per his AVS sheet at his visit on 10/08/2019  He needs to f/u with OT, PT, and Speech therapy but, the referrals were entered while in patient.  Please advise.

## 2019-10-15 NOTE — Telephone Encounter (Signed)
Per my note follow up in 4 weeks. Do the referrals need to be placed again?

## 2019-10-23 MED FILL — LISINOPRIL 10 MG TABS: 10 | 30 days supply | Qty: 30 | Fill #0

## 2019-10-23 MED FILL — AMLODIPINE BESYLATE 10 MG T: 10 | 30 days supply | Qty: 30 | Fill #0

## 2019-10-29 ENCOUNTER — Other Ambulatory Visit: Payer: Self-pay | Admitting: *Deleted

## 2019-10-29 NOTE — Patient Outreach (Signed)
[facility]) Care Management  10/29/2019  IRMA DELANCEY [birth year] [id number]   Call placed to member/significant other to follow up on stroke recovery.  She report he is doing about the same, no visible physical deficits but still have expressive aphasia.  He has not started speech or physical therapy due to not having insurance.  He still has appointment with financial counselor at the Internal Medicine Clinic on 6/8 to review options.  She is eager to get member on insurance plan so that he can get the services he need.  Advised to contact office to speak with financial counselor, care manager and/or social worker to get information on insurance options.  She verbalizes understanding.    She inquires about possibility of member having another stroke.  State he is no longer drinking heavily but will have a couple beers daily.  Also state he is smoking a cigar almost daily.  Advised that these are risk factors, particularly smoking.  She will discuss concerns with him and encourage him to quit.  Will present information for member's case to embedded care management team for follow up as he is now an active patient in that office, Internal Medicine.  Will close case at this time.  Kemper Durie, [place], MSN [facility] Care Management  [facility] Manager [phone]

## 2019-11-04 ENCOUNTER — Ambulatory Visit: Payer: Self-pay

## 2019-11-04 ENCOUNTER — Telehealth: Payer: Self-pay | Admitting: Internal Medicine

## 2019-11-04 NOTE — Telephone Encounter (Signed)
You scheduled pt for financial assistance Neka from Internal Medicine called to see if this was in error or if they need to reschedule since pt missed the appt. Please follow up by calling [phone] and asking for [facility]

## 2019-11-05 ENCOUNTER — Ambulatory Visit: Payer: Self-pay | Admitting: *Deleted

## 2019-11-05 DIAGNOSIS — I639 Cerebral infarction, unspecified: Secondary | ICD-10-CM

## 2019-11-05 DIAGNOSIS — I10 Essential (primary) hypertension: Secondary | ICD-10-CM

## 2019-11-05 NOTE — Chronic Care Management (AMB) (Signed)
  Care Management   Note  11/05/2019 Name: Arthur [name] MRN: [id number] DOB: August 03, 1960  Received CCM referral on 10/29/19 from [facility] Community RN. Plan was to meet with patient  after his appointment with the clinic financial counselor on 11/04/19, however he did not keep the appointment.   The care management team will reach out to the patient again over the next 14 days.   Amber Chrismon CCM BSW has scheduled an appointment to meet patient during his clinic appointment with his primary care provider on 11/19/19.  Cranford Mon RN, CCM, CDCES CCM Clinic RN Care Manager [phone]

## 2019-11-10 ENCOUNTER — Ambulatory Visit: Payer: Self-pay | Admitting: Family Medicine

## 2019-11-19 ENCOUNTER — Other Ambulatory Visit: Payer: Self-pay | Admitting: Internal Medicine

## 2019-11-19 ENCOUNTER — Other Ambulatory Visit: Payer: Self-pay

## 2019-11-19 ENCOUNTER — Ambulatory Visit: Payer: Self-pay | Admitting: *Deleted

## 2019-11-19 ENCOUNTER — Encounter: Payer: Self-pay | Admitting: Internal Medicine

## 2019-11-19 ENCOUNTER — Ambulatory Visit: Payer: Self-pay

## 2019-11-19 ENCOUNTER — Ambulatory Visit (INDEPENDENT_AMBULATORY_CARE_PROVIDER_SITE_OTHER): Payer: Self-pay | Admitting: Internal Medicine

## 2019-11-19 VITALS — BP 143/88 | HR 77 | Temp 98.2°F | Ht 63.0 in | Wt 132.2 lb

## 2019-11-19 DIAGNOSIS — I639 Cerebral infarction, unspecified: Secondary | ICD-10-CM

## 2019-11-19 DIAGNOSIS — I10 Essential (primary) hypertension: Secondary | ICD-10-CM

## 2019-11-19 MED ORDER — AMLODIPINE BESYLATE 10 MG PO TABS: 10.0000 mg | ORAL_TABLET | Freq: Every day | ORAL | 1 refills | Status: DC

## 2019-11-19 MED ORDER — LISINOPRIL 20 MG PO TABS: 20.0000 mg | ORAL_TABLET | Freq: Every day | ORAL | 0 refills | Status: DC

## 2019-11-19 MED ORDER — ATORVASTATIN CALCIUM 80 MG PO TABS: 80.0000 mg | ORAL_TABLET | Freq: Every day | ORAL | 1 refills | Status: DC

## 2019-11-19 MED ORDER — ASPIRIN EC 81 MG PO TBEC: 81.0000 mg | DELAYED_RELEASE_TABLET | Freq: Every day | ORAL | 1 refills | Status: AC

## 2019-11-19 MED ORDER — LISINOPRIL 10 MG PO TABS: 10.0000 mg | ORAL_TABLET | Freq: Every day | ORAL | 1 refills | Status: DC

## 2019-11-19 MED FILL — ASPIRIN LOW DOSE 81 MG TBEC: 81 | 90 days supply | Qty: 90 | Fill #0 | Status: TO

## 2019-11-19 MED FILL — AMLODIPINE BESYLATE 10 MG T: 10 | 90 days supply | Qty: 90 | Fill #0 | Status: TO

## 2019-11-19 MED FILL — ATORVASTATIN 80 MG TABLET: 80 | 30 days supply | Qty: 30 | Fill #0

## 2019-11-19 MED FILL — AMLODIPINE BESYLATE 10 MG T: 10 | 30 days supply | Qty: 30 | Fill #0

## 2019-11-19 MED FILL — LISINOPRIL 20 MG TABLET: 20 | 30 days supply | Qty: 30 | Fill #0

## 2019-11-19 MED FILL — ATORVASTATIN CALCIUM 80 MG: 80 | 90 days supply | Qty: 90 | Fill #0 | Status: TO

## 2019-11-19 NOTE — Progress Notes (Signed)
   CC: HTN f/u  HPI:  [name] is a 59 y.o. male with PMHx as documented below, presented for HTN follow up. Please refer to problem based charting for further details and assessment and plan of current problem and chronic medical conditions.   Past Medical History:  Diagnosis Date  . ETOH abuse   . Hypertension    Review of Systems: Review of Systems  Constitutional: Negative for chills and fever.  Respiratory: Negative for shortness of breath.   Cardiovascular: Negative for chest pain and leg swelling.  Neurological: Negative for dizziness, focal weakness, weakness and headaches.   Physical Exam:  Vitals:   11/19/19 1428  BP: (!) 152/85  Pulse: 77  Temp: 98.2 F (36.8 C)  SpO2: 100%  Weight: 132 lb 3.2 oz (60 kg)  Height: 5\' 3"  (1.6 m)   Constitutional: Well-developed and well-nourished. No acute distress.  Head: Normocephalic and atraumatic.  Eyes: Conjunctivae are normal, EOM nl Cardiovascular:  RRR, nl S1S2, no murmur,  no LEE Respiratory: Effort normal and breath sounds normal. No respiratory distress. No wheezes.  GI: Soft. Bowel sounds are normal. No distension. There is no tenderness.  Neurological: Is alert and oriented x 3  Skin: Not diaphoretic. No erythema.  Psychiatric: Normal mood and affect. Behavior is normal. Judgment and thought content normal.    Assessment & Plan:   See Encounters Tab for problem based charting.  Patient discussed with Dr. 

## 2019-11-19 NOTE — Chronic Care Management (AMB) (Signed)
  Care Management   Note  11/19/2019 Name: TUCK DULWORTH MRN: [id number] DOB: Sep 11, 1960  Encounter opened in error.   Cranford Mon RN, CCM, CDCES CCM Clinic RN Care Manager [phone]

## 2019-11-19 NOTE — Patient Instructions (Signed)
Visit Information  Goals Addressed   None     Patient verbalizes understanding of instructions provided today.   Initial Social Work assessment scheduled for 11/25/19 @ 11:00 AM.

## 2019-11-19 NOTE — Chronic Care Management (AMB) (Signed)
  Care Management    11/19/2019 Name: TOMMY GOOSTREE MRN: [id number] DOB: 1960-08-06  Referred by: Dewayne Hatch, MD Reason for referral : Care Coordination (Disease Management, Community Resources)   HILLIS MCPHATTER is a 59 y.o. year old male who is a primary care patient of [name], [name], MD. The care management team was consulted for assistance with chronic disease management and care coordination needs. Met with patient, spouse, and son after clinic appointment today.  Introduced [employer] and obtained consent.    Review of patient status, including review of consultants reports, relevant laboratory and other test results, and collaboration with appropriate care team members and the patient's provider was performed as part of comprehensive patient evaluation and provision of care management services.    SDOH (Social Determinants of Health) assessments performed: No See Care Plan activities for detailed interventions related to SDOH)     Goals Addressed   None       Mr. Ballinas was given information about Care Management services today including:  1. Care Management services includes personalized support from designated clinical staff supervised by his physician, including individualized plan of care and coordination with other care providers 2. 24/7 contact phone numbers for assistance for urgent and routine care needs. 3. The patient may stop case management services at any time by phone call to the office staff.  Patient agreed to services and verbal consent obtained.    Follow up plan: Initial Social Work assessment scheduled for 11/25/19 @ 11:00 AM.       Ronn Melena, [place] Coordination Social Worker [place] [phone]

## 2019-11-19 NOTE — Assessment & Plan Note (Signed)
BP elevated at 152/85. Higher than the goal.  -Increase Lisinopril to 20 mg QD -Continue Amlodipine 10 mg QD (Refill sent) -F/u in clinic in 2 weeks for BP recheck and BMP

## 2019-11-19 NOTE — Patient Instructions (Addendum)
Pick up your medications from [facility] outpatient pharmacy and take them as instructed. Be aware that I increased Lisinopril to 20 mg daily (instead of 10 mg daily). Please follow up with financial counseler for orange card and then get an appointment with neurology.  Follow up in our clinic in 4 weeks for blood work and blood pressure recheck.  Thank you

## 2019-11-19 NOTE — Assessment & Plan Note (Signed)
Pt was hospitalized 5/7-5/10 due to acute stroke. No residual deficit except some mild memory issue per wife that happened after the stroke. He was discharge with ASA, Plavix (for 30 days), Lipitor but mentions that after finishing them, he did not take any more because he did not know he needs to ask for refill. He has not been seen by neurologist after being discharge from the hospital due to lack of insurance.  Explained that he needs to continue ASA, Lipitor to prevent another stroke. Also counseled about smoke cessation and alcohol abstinence and BP control.  -Sending refill for ASA, and Lipitor  -To follow up with financial counselor for orange card application and follow up with neurology

## 2019-11-20 NOTE — Progress Notes (Signed)
Internal Medicine Clinic Attending  Case discussed with Dr. [name] at the time of the visit.  We reviewed the resident's history and exam and pertinent patient test results.  I agree with the assessment, diagnosis, and plan of care documented in the resident's note.  Danni Leabo, M.D., Ph.D.  

## 2019-11-25 ENCOUNTER — Ambulatory Visit: Payer: Self-pay

## 2019-11-25 ENCOUNTER — Ambulatory Visit: Payer: Self-pay | Admitting: *Deleted

## 2019-11-25 DIAGNOSIS — I639 Cerebral infarction, unspecified: Secondary | ICD-10-CM

## 2019-11-25 DIAGNOSIS — I10 Essential (primary) hypertension: Secondary | ICD-10-CM

## 2019-11-25 NOTE — Progress Notes (Signed)
Internal Medicine Clinic Resident  I have personally reviewed this encounter including the documentation in this note and/or discussed this patient with the care management provider. I will address any urgent items identified by the care management provider and will communicate my actions to the patient's PCP. I have reviewed the patient's CCM visit with my supervising attending, Dr Raines.  Sulaiman Imbert M Sugey Trevathan, MD 11/25/2019    

## 2019-11-25 NOTE — Chronic Care Management (AMB) (Signed)
Chronic Care Management    Clinical Social Work General Note  11/25/2019 Name: Arthur [name] MRN: [id number] DOB: February 26, 1961  Arthur [name] is a 59 y.o. year old male who is a primary care patient of [name], [name], MD. The CCM was consulted to assist the patient with [employer] .   Arthur [name] was given information about Chronic Care Management services today including:  1. CCM service includes personalized support from designated clinical staff supervised by his physician, including individualized plan of care and coordination with other care providers 2. 24/7 contact phone numbers for assistance for urgent and routine care needs. 3. Service will only be billed when office clinical staff spend 20 minutes or more in a month to coordinate care. 4. Only one practitioner may furnish and bill the service in a calendar month. 5. The patient may stop CCM services at any time (effective at the end of the month) by phone call to the office staff. 6. The patient will be responsible for cost sharing (co-pay) of up to 20% of the service fee (after annual deductible is met).  Patient agreed to services and verbal consent obtained.   Review of patient status, including review of consultants reports, relevant laboratory and other test results, and collaboration with appropriate care team members and the patient's provider was performed as part of comprehensive patient evaluation and provision of chronic care management services.    SDOH (Social Determinants of Health) assessments and interventions performed:  Yes SDOH Interventions     Most Recent Value  SDOH Interventions  SDOH Interventions for the Following Domains Food Insecurity, Financial Strain  Food Insecurity Interventions Other (Comment)  [Family receives food stamps]  Financial Strain Interventions Other (Comment)  [Will refer patient to [place] after consent signed]       Outpatient Encounter Medications  as of 11/25/2019  Medication Sig  . amLODipine (NORVASC) 10 MG tablet Take 1 tablet (10 mg total) by mouth daily.  [name] aspirin EC 81 MG tablet Take 1 tablet (81 mg total) by mouth daily.  [name] atorvastatin (LIPITOR) 80 MG tablet Take 1 tablet (80 mg total) by mouth daily.  . folic acid (FOLVITE) 1 MG tablet Take 1 tablet (1 mg total) by mouth daily.  [name] lisinopril (ZESTRIL) 20 MG tablet Take 1 tablet (20 mg total) by mouth daily.  [name] thiamine 100 MG tablet Take 1 tablet (100 mg total) by mouth daily.   No facility-administered encounter medications on file as of 11/25/2019.    Goals Addressed              This Visit's Progress   .  "I want to apply for disability" (pt-stated)        Current Barriers:  [name] Knowledge Barriers related to resources available to address lack of income due to inability to work.  Patient/spouse state today that they are currently able to maintain cost of living but patient would like to contribute income to family    Case Manager Clinical Goal(s):  [name] Over the next 180 days, patient will work with BSW to address needs related to financial constraints; applying for disability  . Over the next 180 days, BSW will collaborate with RN Care Manager to address care management and care coordination needs  Interventions:  . Patient interviewed and appropriate assessments performed . Educated patient about Disability Assistance Program through [facility] which can assist with application process . Mailed consent form to be signed and returned so that  referral can be placed . Reminded patient of upcoming appointment with Financial Counselor on 12/14/19 . Collaborated with RN Care Manager and patient to establish an individualized plan of care   Patient Self Care Activities:  . Self administers medications as prescribed . Performs ADL's independently . Performs IADL's independently  Initial goal documentation         Follow Up Plan: SW will follow up with patient  by phone over the next 10       .   Ronn Melena, [place] Coordination Social Worker [place] [phone]

## 2019-11-25 NOTE — Patient Instructions (Signed)
Visit Information  Goals Addressed              This Visit's Progress   .  "I want to apply for disability" (pt-stated)        Current Barriers:  [name] Knowledge Barriers related to resources available to address lack of income due to inability to work.  Patient/spouse state today that they are currently able to maintain cost of living but patient would like to contribute income to family    Case Manager Clinical Goal(s):  [name] Over the next 180 days, patient will work with BSW to address needs related to financial constraints; applying for disability  . Over the next 180 days, BSW will collaborate with RN Care Manager to address care management and care coordination needs  Interventions:  . Patient interviewed and appropriate assessments performed . Educated patient about Disability Assistance Program through [facility] which can assist with application process . Mailed consent form to be signed and returned so that referral can be placed . Reminded patient of upcoming appointment with Financial Counselor on 12/14/19 . Collaborated with RN Care Manager and patient to establish an individualized plan of care   Patient Self Care Activities:  . Self administers medications as prescribed . Performs ADL's independently . Performs IADL's independently  Initial goal documentation        Patient verbalizes understanding of instructions provided today.   The patient has been provided with contact information for the care management team and has been advised to call with any health related questions or concerns.       [name], BSW Embedded Care Coordination Social Worker [facility] [phone]

## 2019-11-25 NOTE — Patient Instructions (Signed)
Visit Information  Goals Addressed              This Visit's Progress     Patient Stated   .  Patient stated to BSW, "I want to apply for disability" (pt-stated)        Current Barriers: . Chronic Disease Management support, education, and care coordination needs related to: knowledge Barriers related to resources available to address lack of income due to inability to work. Patient/spouse state today that they are currently able to maintain cost of living but patient would like to contribute income to family . Care coordination needs related to hypertension and hx of CVA   Case Manager Clinical Goal(s): [name] Over the next 180 days, patient will work with BSW to address needs related to financial constraints; applying for disability  . Over the next 180 days, BSW will collaborate with RN Care Manager to address care management and care coordination needs   Interventions: . Collaborated with BSW to initiate plan of care to address needs related to knowledge Barriers related to resources available to address lack of income due to inability to work and care coordination needs related to hypertension and CVA.  Patient Self Care Activities:  . Performs ADL's independently . Performs IADL's independently  Initial goal documentation         Follow-up Plan:  Appt with Financial Counselor scheduled for 12/14/19 F/u with BSW within the next 10 days RN Care Manager f/u visit pending scheduling   Demetrios Loll, BSN, RN-BC Embedded Chronic Care Manager [facility] Care Management Direct Dial: [phone]

## 2019-11-25 NOTE — Chronic Care Management (AMB) (Signed)
  Care Management   Initial Visit Note  11/25/2019 Name: Arthur [name] MRN: [id number] DOB: [birth year]  Referred by: Chevis Pretty, MD Reason for referral : Chronic Care Management (Initiate Collaborative Care Plan)   Arthur [name] is a 59 y.o. year old male who is a primary care patient of [name], Shawna Orleans, MD. The CCM team was consulted for assistance with chronic disease management and care coordination needs related to HTN and hx of CVA.  Review of patient status, including review of consultants reports, relevant laboratory and other test results, and collaboration with appropriate care team members and the patient's provider was performed as part of comprehensive patient evaluation and provision of chronic care management services.    SDOH (Social Determinants of Health) assessments performed: No See Care Plan activities for detailed interventions related to SDOH    Medications: Outpatient Encounter Medications as of 11/25/2019  Medication Sig  . amLODipine (NORVASC) 10 MG tablet Take 1 tablet (10 mg total) by mouth daily.  [name] aspirin EC 81 MG tablet Take 1 tablet (81 mg total) by mouth daily.  [name] atorvastatin (LIPITOR) 80 MG tablet Take 1 tablet (80 mg total) by mouth daily.  . folic acid (FOLVITE) 1 MG tablet Take 1 tablet (1 mg total) by mouth daily.  [name] lisinopril (ZESTRIL) 20 MG tablet Take 1 tablet (20 mg total) by mouth daily.  [name] thiamine 100 MG tablet Take 1 tablet (100 mg total) by mouth daily.   No facility-administered encounter medications on file as of 11/25/2019.     BP Readings from Last 3 Encounters:  11/19/19 (!) 143/88  10/08/19 (!) 142/92  10/05/19 (!) 176/112    RN Care Plan             This Visit's Progress     Patient Stated   .  Patient stated to BSW, "I want to apply for disability" (pt-stated)        Current Barriers: . Chronic Disease Management support, education, and care coordination needs related to: knowledge Barriers related to  resources available to address lack of income due to inability to work. Patient/spouse state today that they are currently able to maintain cost of living but patient would like to contribute income to family . Care coordination needs related to hypertension and hx of CVA   Case Manager Clinical Goal(s): [name] Over the next 180 days, patient will work with BSW to address needs related to financial constraints; applying for disability  . Over the next 180 days, BSW will collaborate with RN Care Manager to address care management and care coordination needs   Interventions: . Collaborated with BSW to initiate plan of care to address needs related to knowledge Barriers related to resources available to address lack of income due to inability to work and care coordination needs related to hypertension and CVA.  Patient Self Care Activities:  . Performs ADL's independently . Performs IADL's independently  Initial goal documentation         Plan:  Appt with Financial Counselor scheduled for 12/14/19 F/u with BSW within the next 10 days RN Care Manager f/u visit pending scheduling   Demetrios Loll, BSN, RN-BC Embedded Chronic Care Manager [facility] Care Management Direct Dial: [phone]

## 2019-11-27 NOTE — Progress Notes (Signed)
Internal Medicine Clinic Attending  CCM services provided by the care management provider and their documentation were reviewed with Dr. Krienke.  We reviewed the pertinent findings, urgent action items addressed by the resident and non-urgent items to be addressed by the PCP.  I agree with the assessment, diagnosis, and plan of care documented in the CCM and resident's note.  Ranette Luckadoo N Canna Nickelson, MD 11/27/2019  

## 2019-12-01 ENCOUNTER — Ambulatory Visit: Payer: Self-pay

## 2019-12-01 DIAGNOSIS — I10 Essential (primary) hypertension: Secondary | ICD-10-CM

## 2019-12-01 DIAGNOSIS — I639 Cerebral infarction, unspecified: Secondary | ICD-10-CM

## 2019-12-01 NOTE — Chronic Care Management (AMB) (Signed)
  Care Management   Follow Up Note   12/01/2019 Name: Arthur [name] MRN: [id number] DOB: [birth year]  Referred by: Chevis Pretty, MD Reason for referral : No chief complaint on file.   Arthur [name] is a 59 y.o. year old male who is a primary care patient of [name], Shawna Orleans, MD. The care management team was consulted for assistance with care management and care coordination needs.    Review of patient status, including review of consultants reports, relevant laboratory and other test results, and collaboration with appropriate care team members and the patient's provider was performed as part of comprehensive patient evaluation and provision of chronic care management services.    SDOH (Social Determinants of Health) assessments performed: No See Care Plan activities for detailed interventions related to [facility])     Advanced Directives: See Care Plan and Vynca application for related entries.   Goals Addressed              This Visit's Progress   .  "I want to apply for disability" (pt-stated)        Current Barriers:  [name] Knowledge Barriers related to resources available to address lack of income due to inability to work.  Patient/spouse state today that they are currently able to maintain cost of living but patient would like to contribute income to family    Case Manager Clinical Goal(s):  [name] Over the next 180 days, patient will work with BSW to address needs related to financial constraints; applying for disability  . Over the next 180 days, BSW will collaborate with RN Care Manager to address care management and care coordination needs  Interventions:  . Called patient to ensure receipt of consent for The Disability Assistance Program that was mailed on 11/26/19; consent not yet received.    Patient Self Care Activities:  . Self administers medications as prescribed . Performs ADL's independently . Performs IADL's independently  Please see past updates related to this  goal by clicking on the "Past Updates" button in the selected goal          Will follow up again before the end of the week to ensure receipt of consent.  Will mail again if necessary.     [name], BSW Embedded Care Coordination Social Worker [facility] [phone]

## 2019-12-01 NOTE — Patient Instructions (Signed)
Visit Information  Goals Addressed              This Visit's Progress   .  "I want to apply for disability" (pt-stated)        Current Barriers:  [name] Knowledge Barriers related to resources available to address lack of income due to inability to work.  Patient/spouse state today that they are currently able to maintain cost of living but patient would like to contribute income to family    Case Manager Clinical Goal(s):  [name] Over the next 180 days, patient will work with BSW to address needs related to financial constraints; applying for disability  . Over the next 180 days, BSW will collaborate with RN Care Manager to address care management and care coordination needs  Interventions:  . Called patient to ensure receipt of consent for The Disability Assistance Program that was mailed on 11/26/19; consent not yet received.    Patient Self Care Activities:  . Self administers medications as prescribed . Performs ADL's independently . Performs IADL's independently  Please see past updates related to this goal by clicking on the "Past Updates" button in the selected goal         Patient verbalizes understanding of instructions provided today.   Will follow up again before the end of the week to ensure receipt of consent.       [name], BSW Embedded Care Coordination Social Worker [facility] [phone]

## 2019-12-03 ENCOUNTER — Ambulatory Visit: Payer: Self-pay

## 2019-12-03 DIAGNOSIS — I10 Essential (primary) hypertension: Secondary | ICD-10-CM

## 2019-12-03 DIAGNOSIS — I639 Cerebral infarction, unspecified: Secondary | ICD-10-CM

## 2019-12-03 NOTE — Chronic Care Management (AMB) (Signed)
  Care Management   Follow Up Note   12/03/2019 Name: Arthur [name] MRN: [id number] DOB: 01/12/1961  Referred by: Chevis Pretty, MD Reason for referral : Care Coordination (Community Resources)   Arthur [name] is a 59 y.o. year old male who is a primary care patient of [name], Shawna Orleans, MD. The care management team was consulted for assistance with care management and care coordination needs.    Review of patient status, including review of consultants reports, relevant laboratory and other test results, and collaboration with appropriate care team members and the patient's provider was performed as part of comprehensive patient evaluation and provision of chronic care management services.    SDOH (Social Determinants of Health) assessments performed: No See Care Plan activities for detailed interventions related to [facility])     Advanced Directives: See Care Plan and Vynca application for related entries.   Goals Addressed              This Visit's Progress   .  "I want to apply for disability" (pt-stated)        Current Barriers:  [name] Knowledge Barriers related to resources available to address lack of income due to inability to work.  Patient/spouse state today that they are currently able to maintain cost of living but patient would like to contribute income to family    Case Manager Clinical Goal(s):  [name] Over the next 180 days, patient will work with BSW to address needs related to financial constraints; applying for disability  . Over the next 180 days, BSW will collaborate with RN Care Manager to address care management and care coordination needs  Interventions:  . Received call from patient's spuse that consent for The Disability Assistance Program was received and has been mailed back.   Patient Self Care Activities:  . Self administers medications as prescribed . Performs ADL's independently . Performs IADL's independently  Please see past updates related to  this goal by clicking on the "Past Updates" button in the selected goal          Will follow up with patient when mailed consent is received and referral has been submitted to The Disability Assistance Program .    [name], [place] Embedded Care Coordination Social Worker [facility] [phone]

## 2019-12-03 NOTE — Patient Instructions (Signed)
Visit Information  Goals Addressed              This Visit's Progress   .  "I want to apply for disability" (pt-stated)        Current Barriers:  [name] Knowledge Barriers related to resources available to address lack of income due to inability to work.  Patient/spouse state today that they are currently able to maintain cost of living but patient would like to contribute income to family    Case Manager Clinical Goal(s):  [name] Over the next 180 days, patient will work with BSW to address needs related to financial constraints; applying for disability  . Over the next 180 days, BSW will collaborate with RN Care Manager to address care management and care coordination needs  Interventions:  . Received call from patient's spuse that consent for The Disability Assistance Program was received and has been mailed back.   Patient Self Care Activities:  . Self administers medications as prescribed . Performs ADL's independently . Performs IADL's independently  Please see past updates related to this goal by clicking on the "Past Updates" button in the selected goal         Patient verbalizes understanding of instructions provided today.   Will follow up with patient when mailed consent is received and referral has been submitted to The Disability Assistance Program       Naria Abbey, [place] Embedded Care Coordination Social Worker [facility] [phone]

## 2019-12-04 ENCOUNTER — Telehealth: Payer: Self-pay

## 2019-12-07 ENCOUNTER — Ambulatory Visit: Payer: Self-pay | Admitting: *Deleted

## 2019-12-07 DIAGNOSIS — I10 Essential (primary) hypertension: Secondary | ICD-10-CM

## 2019-12-07 DIAGNOSIS — I639 Cerebral infarction, unspecified: Secondary | ICD-10-CM

## 2019-12-07 NOTE — Chronic Care Management (AMB) (Signed)
Care Management   Follow Up Note   12/07/2019 Name: Arthur [name] MRN: [id number] DOB: Jan 24, 1961  Referred by: Chevis Pretty, MD Reason for referral : Care Coordination (HTN, s/p CVA)   Arthur [name] is a 59 y.o. year old male who is a primary care patient of [name], Shawna Orleans, MD. The care management team was consulted for assistance with care management and care coordination needs.    Review of patient status, including review of consultants reports, relevant laboratory and other test results, and collaboration with appropriate care team members and the patient's provider was performed as part of comprehensive patient evaluation and provision of chronic care management services.    SDOH (Social Determinants of Health) assessments performed: No See Care Plan activities for detailed interventions related to [facility])     Advanced Directives: See Care Plan and Vynca application for related entries.   Goals Addressed              This Visit's Progress     Patient Stated   .  "I want to apply for disability" (pt-stated)        Current Barriers:  [name] Knowledge Barriers related to resources available to address lack of income due to inability to work.  Patient/spouse state today that they are currently able to maintain cost of living but patient would like to contribute income to family    Case Manager Clinical Goal(s):  [name] Over the next 180 days, patient will work with BSW to address needs related to financial constraints; applying for disability  . Over the next 180 days, BSW will collaborate with RN Care Manager to address care management and care coordination needs  Interventions:  . Received signed authorization from patient in the mail to disclose information to the [employer] Disability Assistance Program . [facility] via e-mail to ask about next steps in assisting patient with referral to the program  Patient Self Care Activities:  . Self  administers medications as prescribed . Performs ADL's independently . Performs IADL's independently  Please see past updates related to this goal by clicking on the "Past Updates" button in the selected goal      .  Patient stated to BSW, "I want to apply for disability" (pt-stated)        Current Barriers: . Chronic Disease Management support, education, and care coordination needs related to: knowledge Barriers related to resources available to address lack of income due to inability to work. Patient/spouse state today that they are currently able to maintain cost of living but patient would like to contribute income to family . Care coordination needs related to hypertension and hx of CVA   Case Manager Clinical Goal(s): [name] Over the next 180 days, patient will work with BSW to address needs related to financial constraints; applying for disability  . Over the next 180 days, BSW will collaborate with RN Care Manager to address care management and care coordination needs   Interventions: . Received signed authorization from patient in the mail to disclose information to the [employer] Disability Assistance Program . [facility] via e-mail to ask about next steps in assisting patient with referral to the program   Patient Self Care Activities:  . Performs ADL's independently . Performs IADL's independently  Please see past updates related to this goal by clicking on the "Past Updates" button in the selected goal          The care management team will reach out to  the patient again over the next 30-60 days.   Kelli Churn RN, CCM, [facility] RN Care Manager [phone]

## 2019-12-07 NOTE — Chronic Care Management (AMB) (Signed)
Internal Medicine Clinic Resident  I have personally reviewed this encounter including the documentation in this note and/or discussed this patient with the care management provider. I will address any urgent items identified by the care management provider and will communicate my actions to the patient's PCP. I have reviewed the patient's CCM visit with my supervising attending, Dr Vincent.  Derriona Branscom K Dade Rodin, MD 12/07/2019   

## 2019-12-07 NOTE — Patient Instructions (Signed)
Visit Information  Goals Addressed              This Visit's Progress     Patient Stated   .  "I want to apply for disability" (pt-stated)        Current Barriers:  [name] Knowledge Barriers related to resources available to address lack of income due to inability to work.  Patient/spouse state today that they are currently able to maintain cost of living but patient would like to contribute income to family    Case Manager Clinical Goal(s):  [name] Over the next 180 days, patient will work with BSW to address needs related to financial constraints; applying for disability  . Over the next 180 days, BSW will collaborate with RN Care Manager to address care management and care coordination needs  Interventions:  . Received signed authorization from patient in the mail to disclose information to the [employer] Disability Assistance Program . [facility] via e-mail to ask about next steps in assisting patient with referral to the program  Patient Self Care Activities:  . Self administers medications as prescribed . Performs ADL's independently . Performs IADL's independently  Please see past updates related to this goal by clicking on the "Past Updates" button in the selected goal      .  Patient stated to BSW, "I want to apply for disability" (pt-stated)        Current Barriers: . Chronic Disease Management support, education, and care coordination needs related to: knowledge Barriers related to resources available to address lack of income due to inability to work. Patient/spouse state today that they are currently able to maintain cost of living but patient would like to contribute income to family . Care coordination needs related to hypertension and hx of CVA   Case Manager Clinical Goal(s): [name] Over the next 180 days, patient will work with BSW to address needs related to financial constraints; applying for disability  . Over the next 180 days, BSW will collaborate with  RN Care Manager to address care management and care coordination needs   Interventions: . Received signed authorization from patient in the mail to disclose information to the [employer] Disability Assistance Program . [facility] via e-mail to ask about next steps in assisting patient with referral to the program   Patient Self Care Activities:  . Performs ADL's independently . Performs IADL's independently  Please see past updates related to this goal by clicking on the "Past Updates" button in the selected goal         The patient verbalized understanding of instructions provided today and declined a print copy of patient instruction materials.   The care management team will reach out to the patient again over the next 30-60 days.   Cranford Mon RN, CCM, [facility] C[facility] Clinic RN Care Manager [phone]

## 2019-12-08 ENCOUNTER — Ambulatory Visit: Payer: Self-pay | Admitting: *Deleted

## 2019-12-08 DIAGNOSIS — I639 Cerebral infarction, unspecified: Secondary | ICD-10-CM

## 2019-12-08 DIAGNOSIS — I10 Essential (primary) hypertension: Secondary | ICD-10-CM

## 2019-12-08 NOTE — Progress Notes (Signed)
Internal Medicine Clinic Resident  I have personally reviewed this encounter including the documentation in this note and/or discussed this patient with the care management provider. I will address any urgent items identified by the care management provider and will communicate my actions to the patient's PCP. I have reviewed the patient's CCM visit with my supervising attending, Dr Narendra.  Aurthur Wingerter, MD  [facility] PGY-2 12/08/2019    

## 2019-12-08 NOTE — Progress Notes (Addendum)
Internal Medicine Clinic Attending  CCM services provided by the care management provider and their documentation were discussed with Dr. Lee. We reviewed the pertinent findings, urgent action items addressed by the resident and non-urgent items to be addressed by the PCP.  I agree with the assessment, diagnosis, and plan of care documented in the CCM and resident's note.  Benford Asch Thomas Everli Rother, MD 12/08/2019 

## 2019-12-08 NOTE — Chronic Care Management (AMB) (Signed)
Care Management   Follow Up Note   12/08/2019 Name: Arthur [name] MRN: [id number] DOB: 1960-07-04  Referred by: Chevis Pretty, MD Reason for referral : Care Coordination (HTN, s/p CVA)   Arthur [name] is a 59 y.o. year old male who is a primary care patient of [name], Shawna Orleans, MD. The care management team was consulted for assistance with care management and care coordination needs.    Review of patient status, including review of consultants reports, relevant laboratory and other test results, and collaboration with appropriate care team members and the patient's provider was performed as part of comprehensive patient evaluation and provision of chronic care management services.    SDOH (Social Determinants of Health) assessments performed: No See Care Plan activities for detailed interventions related to [facility])     Advanced Directives: See Care Plan and Vynca application for related entries.   Goals Addressed              This Visit's Progress     Patient Stated   .  "I want to apply for disability" (pt-stated)        Current Barriers:  [name] Knowledge Barriers related to resources available to address lack of income due to inability to work.  Patient/spouse state today that they are currently able to maintain cost of living but patient would like to contribute income to family    Case Manager Clinical Goal(s):  [name] Over the next 180 days, patient will work with BSW to address needs related to financial constraints; applying for disability  . Over the next 180 days, BSW will collaborate with RN Care Manager to address care management and care coordination needs  Interventions:  . Received return e-mail from Layla Barter , Interior and spatial designer of Disability Services at the [facility]; per her request securely emailed signed release of information form to her.  Patient Self Care Activities:  . Self administers medications as prescribed . Performs ADL's  independently . Performs IADL's independently  Please see past updates related to this goal by clicking on the "Past Updates" button in the selected goal      .  Patient stated to BSW, "I want to apply for disability" (pt-stated)        Current Barriers: . Chronic Disease Management support, education, and care coordination needs related to: knowledge Barriers related to resources available to address lack of income due to inability to work. Patient/spouse state today that they are currently able to maintain cost of living but patient would like to contribute income to family . Care coordination needs related to hypertension and hx of CVA   Case Manager Clinical Goal(s): [name] Over the next 180 days, patient will work with BSW to address needs related to financial constraints; applying for disability  . Over the next 180 days, BSW will collaborate with RN Care Manager to address care management and care coordination needs   Interventions: . Received return e-mail from Layla Barter , Interior and spatial designer of Disability Services at the [facility]; per her request securely emailed signed release of information form to her .  Patient Self Care Activities:  . Performs ADL's independently . Performs IADL's independently  Please see past updates related to this goal by clicking on the "Past Updates" button in the selected goal          The care management team will reach out to the patient again over the next 30-60 days.   Cranford Mon RN, CCM, CDCES CCM Clinic RN Care Manager  [phone] 

## 2019-12-08 NOTE — Patient Instructions (Signed)
Visit Information  Goals Addressed              This Visit's Progress     Patient Stated   .  "I want to apply for disability" (pt-stated)        Current Barriers:  [name] Knowledge Barriers related to resources available to address lack of income due to inability to work.  Patient/spouse state today that they are currently able to maintain cost of living but patient would like to contribute income to family    Case Manager Clinical Goal(s):  [name] Over the next 180 days, patient will work with BSW to address needs related to financial constraints; applying for disability  . Over the next 180 days, BSW will collaborate with RN Care Manager to address care management and care coordination needs  Interventions:  . Received return e-mail from Layla Barter , Interior and spatial designer of Disability Services at the [facility]; per her request securely emailed signed release of information form to her.  Patient Self Care Activities:  . Self administers medications as prescribed . Performs ADL's independently . Performs IADL's independently  Please see past updates related to this goal by clicking on the "Past Updates" button in the selected goal      .  Patient stated to BSW, "I want to apply for disability" (pt-stated)        Current Barriers: . Chronic Disease Management support, education, and care coordination needs related to: knowledge Barriers related to resources available to address lack of income due to inability to work. Patient/spouse state today that they are currently able to maintain cost of living but patient would like to contribute income to family . Care coordination needs related to hypertension and hx of CVA   Case Manager Clinical Goal(s): [name] Over the next 180 days, patient will work with BSW to address needs related to financial constraints; applying for disability  . Over the next 180 days, BSW will collaborate with RN Care Manager to address care management and care coordination  needs   Interventions: . Received return e-mail from Layla Barter , Interior and spatial designer of Disability Services at the [facility]; per her request securely emailed signed release of information form to her .  Patient Self Care Activities:  . Performs ADL's independently . Performs IADL's independently  Please see past updates related to this goal by clicking on the "Past Updates" button in the selected goal         The patient verbalized understanding of instructions provided today and declined a print copy of patient instruction materials.   The care management team will reach out to the patient again over the next 30-60 days.   Cranford Mon RN, CCM, CDCES CCM Clinic RN Care Manager [phone]

## 2019-12-14 ENCOUNTER — Ambulatory Visit: Payer: Self-pay

## 2019-12-14 ENCOUNTER — Ambulatory Visit: Payer: Self-pay | Admitting: *Deleted

## 2019-12-14 DIAGNOSIS — I10 Essential (primary) hypertension: Secondary | ICD-10-CM

## 2019-12-14 DIAGNOSIS — I639 Cerebral infarction, unspecified: Secondary | ICD-10-CM

## 2019-12-14 NOTE — Patient Instructions (Signed)
Visit Information Please review the HTN booklet provided to you today. Please take you blood pressure daily and write it down. Call the clinic for blood pressure readings consistently higher than 140/90. Someone from the [facility] will call you about applying for long term disability.   Goals Addressed              This Visit's Progress     Patient Stated   .  "I don't have a way of checking my blood pressure." (pt-stated)        CARE PLAN ENTRY (see longitudinal plan of care for additional care plan information)  Objective:  . Last practice recorded BP readings:  BP Readings from Last 3 Encounters:  11/19/19 (!) 143/88  10/08/19 (!) 142/92  10/05/19 (!) 176/112 .   [name] Most recent eGFR/CrCl: No results found for: EGFR  No components found for: CRCL  Current Barriers:  [name] Knowledge Deficits related to basic understanding of hypertension pathophysiology and self care management- met with patient and his wife and their disabled son in the clinic after patient completed his financial counseling appointment with Remi Haggard  Case Manager Clinical Goal(s):  [name] Over the next 30-60 days, patient will demonstrate improved adherence to prescribed treatment plan for hypertension as evidenced by taking all medications as prescribed, monitoring and recording blood pressure as directed, adhering to low sodium/DASH diet . Over the next 30-60 days, patient will demonstrate improved health management independence as evidenced by checking blood pressure as directed and notifying PCP if SBP>180 or DBP > 100 taking all medications as prescribe, and adhering to a low sodium diet as discussed.  Interventions:  . Evaluation of current treatment plan related to hypertension self management and patient's adherence to plan as established by provider. . Provided education to patient re: stroke prevention, s/s of heart attack and stroke, DASH diet, complications of uncontrolled blood pressure . Provided  patient with booklet entitled " Blood Pressure Control" and asked patient and wife to read . Provided patient with [place] [employer] spiral bound calendar with HTN education including action plan and stop light information and log sheet to record blood pressure readings . Reviewed blood pressure targets and wrote target blood pressure in [place] [employer] spiral bound calendar on BP log sheet . Provided patient with Omron home blood pressure monitor and demonstrated it's use. Evelina Bucy patient's blood pressure using home monitor. Seated blood pressure in right arm= 162/101, seated blood pressure in left arm= 147/95 . Reviewed medications with patient and discussed importance of compliance . Discussed plans with patient for ongoing care management follow up and provided patient with direct contact information for care management team . Advised patient, providing education and rationale, to monitor blood pressure daily and record, calling PCP for findings outside established parameters.  . Reviewed scheduled/upcoming provider appointments including:   Patient Self Care Activities:  . UNABLE to independently manage HTN . Self administers medications as prescribed . Attends all scheduled provider appointments . Calls provider office for new concerns, questions, or BP outside discussed parameters . Checks BP and records as discussed . Follows a low sodium diet/DASH diet  Initial goal documentation      .  "I want to apply for disability" (pt-stated)        Current Barriers:  [name] Knowledge Barriers related to resources available to address lack of income due to inability to work- met with patient and his wife and their disabled son in the  clinic after patient completed his financial counseling appointment with Remi Haggard  Case Manager Clinical Goal(s):  [name] Over the next 180 days, patient will work with BSW to address needs related to financial constraints;  applying for disability  . Over the next 180 days, BSW will collaborate with RN Care Manager to address care management and care coordination needs  Interventions:  . Completed referral form for [place] Program Received and securely emailed the form to Arrie Senate , Mudlogger of Disability Services at the [employer] and to [place]  Patient Self Care Activities:  . Self administers medications as prescribed . Performs ADL's independently . Performs IADL's independently  Please see past updates related to this goal by clicking on the "Past Updates" button in the selected goal      .  Patient stated to BSW, "I want to apply for disability" (pt-stated)        Current Barriers: . Chronic Disease Management support, education, and care coordination needs related to: knowledge Barriers related to resources available to address lack of income due to inability to work.- met with patient and his wife and their disabled son in the clinic after patient completed his financial counseling appointment with Remi Haggard  Case Manager Clinical Goal(s): [name] Over the next 180 days, patient will work with BSW to address needs related to financial constraints; applying for disability  . Over the next 180 days, BSW will collaborate with RN Care Manager to address care management and care coordination needs   Interventions: . Completed referral form for [place] Program Received and securely emailed the form to Arrie Senate , Mudlogger of Disability Services at the [facility] and to [place]  Patient Self Care Activities:  . Performs ADL's independently . Performs IADL's independently  Please see past updates related to this goal by clicking on the "Past Updates" button in the selected goal         The patient verbalized understanding of instructions provided today and declined a print copy of patient  instruction materials.   The care management team will reach out to the patient again over the next 30-60 days.   Kelli Churn RN, CCM, [facility] RN Care Manager [phone]

## 2019-12-14 NOTE — Chronic Care Management (AMB) (Signed)
Care Management   Follow Up Note   12/14/2019 Name: Arthur [name] MRN: [id number] DOB: 1961-03-06  Referred by: Dewayne Hatch, MD Reason for referral : Care Coordination (HTN, s/p CVA)   Arthur [name] is a 59 y.o. year old male who is a primary care patient of [name], [name], MD. The care management team was consulted for assistance with care management and care coordination needs.    Review of patient status, including review of consultants reports, relevant laboratory and other test results, and collaboration with appropriate care team members and the patient's provider was performed as part of comprehensive patient evaluation and provision of chronic care management services.    SDOH (Social Determinants of Health) assessments performed: No See Care Plan activities for detailed interventions related to [facility])     Advanced Directives: See Care Plan and Vynca application for related entries.   Goals Addressed              This Visit's Progress     Patient Stated   .  "I don't have a way of checking my blood pressure." (pt-stated)        CARE PLAN ENTRY (see longitudinal plan of care for additional care plan information)  Objective:  . Last practice recorded BP readings:  BP Readings from Last 3 Encounters:  11/19/19 (!) 143/88  10/08/19 (!) 142/92  10/05/19 (!) 176/112 .   [name] Most recent eGFR/CrCl: No results found for: EGFR  No components found for: CRCL  Current Barriers:  [name] Knowledge Deficits related to basic understanding of hypertension pathophysiology and self care management- met with patient and his wife and their disabled son in the clinic after patient completed his financial counseling appointment with Remi Haggard  Case Manager Clinical Goal(s):  [name] Over the next 30-60 days, patient will demonstrate improved adherence to prescribed treatment plan for hypertension as evidenced by taking all medications as prescribed, monitoring and recording blood  pressure as directed, adhering to low sodium/DASH diet . Over the next 30-60 days, patient will demonstrate improved health management independence as evidenced by checking blood pressure as directed and notifying PCP if SBP>180 or DBP > 100 taking all medications as prescribe, and adhering to a low sodium diet as discussed.  Interventions:  . Evaluation of current treatment plan related to hypertension self management and patient's adherence to plan as established by provider. . Provided education to patient re: stroke prevention, s/s of heart attack and stroke, DASH diet, complications of uncontrolled blood pressure . Provided patient with booklet entitled " Blood Pressure Control" and asked patient and wife to read . Provided patient with [place] [employer] spiral bound calendar with HTN education including action plan and stop light information and log sheet to record blood pressure readings . Reviewed blood pressure targets and wrote target blood pressure in [place] [employer] spiral bound calendar on BP log sheet . Provided patient with Om[employer] home blood pressure monitor and demonstrated it's use. Evelina Bucy patient's blood pressure using home monitor. Seated blood pressure in right arm= 162/101, seated blood pressure in left arm= 147/95 . Reviewed medications with patient and discussed importance of compliance . Discussed plans with patient for ongoing care management follow up and provided patient with direct contact information for care management team . Advised patient, providing education and rationale, to monitor blood pressure daily and record, calling PCP for findings outside established parameters.  . Reviewed scheduled/upcoming provider appointments including:   Patient Self Care  Activities:  . UNABLE to independently manage HTN . Self administers medications as prescribed . Attends all scheduled provider appointments . Calls provider office  for new concerns, questions, or BP outside discussed parameters . Checks BP and records as discussed . Follows a low sodium diet/DASH diet  Initial goal documentation      .  "I want to apply for disability" (pt-stated)        Current Barriers:  [name] Knowledge Barriers related to resources available to address lack of income due to inability to work- met with patient and his wife and their disabled son in the clinic after patient completed his financial counseling appointment with Remi Haggard  Case Manager Clinical Goal(s):  [name] Over the next 180 days, patient will work with BSW to address needs related to financial constraints; applying for disability  . Over the next 180 days, BSW will collaborate with RN Care Manager to address care management and care coordination needs  Interventions:  . Completed referral form for [place] Program Received and securely emailed the form to Arrie Senate , Mudlogger of Disability Services at the [employer] and to [place]  Patient Self Care Activities:  . Self administers medications as prescribed . Performs ADL's independently . Performs IADL's independently  Please see past updates related to this goal by clicking on the "Past Updates" button in the selected goal      .  Patient stated to BSW, "I want to apply for disability" (pt-stated)        Current Barriers: . Chronic Disease Management support, education, and care coordination needs related to: knowledge Barriers related to resources available to address lack of income due to inability to work.- met with patient and his wife and their disabled son in the clinic after patient completed his financial counseling appointment with Remi Haggard  Case Manager Clinical Goal(s): [name] Over the next 180 days, patient will work with BSW to address needs related to financial constraints; applying for disability  . Over the next 180 days, BSW will collaborate with RN  Care Manager to address care management and care coordination needs   Interventions: . Completed referral form for [place] Program Received and securely emailed the form to Arrie Senate , Mudlogger of Disability Services at the [facility] and to [place]  Patient Self Care Activities:  . Performs ADL's independently . Performs IADL's independently  Please see past updates related to this goal by clicking on the "Past Updates" button in the selected goal          The care management team will reach out to the patient again over the next 30-60 days.   Kelli Churn RN, CCM, [facility] RN Care Manager [phone]

## 2019-12-15 NOTE — Progress Notes (Signed)
Internal Medicine Clinic Resident  I have personally reviewed this encounter including the documentation in this note and/or discussed this patient with the care management provider. I will address any urgent items identified by the care management provider and will communicate my actions to the patient's PCP. I have reviewed the patient's CCM visit with my supervising attending, Dr Hoffman.  Willim Turnage D Tya Haughey, DO 12/15/2019    

## 2019-12-16 ENCOUNTER — Telehealth: Payer: Self-pay

## 2019-12-18 MED FILL — AMLODIPINE BESYLATE 10 MG T: 10 | 30 days supply | Qty: 30 | Fill #1

## 2019-12-18 MED FILL — ATORVASTATIN 80 MG TABLET: 80 | 30 days supply | Qty: 30 | Fill #1

## 2019-12-18 MED FILL — LISINOPRIL 20 MG TABLET: 20 | 30 days supply | Qty: 30 | Fill #1

## 2019-12-25 NOTE — Progress Notes (Signed)
Internal Medicine Clinic Attending  CCM services provided by the care management provider and their documentation were discussed with Dr. Bloomfield. We reviewed the pertinent findings, urgent action items addressed by the resident and non-urgent items to be addressed by the PCP.  I agree with the assessment, diagnosis, and plan of care documented in the CCM and resident's note.  Rubyann Lingle C Rodrigus Kilker, DO 12/25/2019  

## 2020-01-04 ENCOUNTER — Ambulatory Visit: Payer: Self-pay

## 2020-01-04 DIAGNOSIS — I639 Cerebral infarction, unspecified: Secondary | ICD-10-CM

## 2020-01-04 DIAGNOSIS — I10 Essential (primary) hypertension: Secondary | ICD-10-CM

## 2020-01-04 NOTE — Patient Instructions (Signed)
Visit Information  Goals Addressed              This Visit's Progress   .  "I want to apply for disability" (pt-stated)        Current Barriers:  . Knowledge Barriers related to resources available to address lack of income due to inability to work- met with patient and his wife and their disabled son in the clinic after patient completed his financial counseling appointment with Neka Willis  Case Manager Clinical Goal(s):  . Over the next 180 days, patient will work with BSW to address needs related to financial constraints; applying for disability  . Over the next 180 days, BSW will collaborate with RN Care Manager to address care management and care coordination needs  Interventions:  . Answered questions that patient's spouse had about letter received from Disability Assistance Program. . Encouraged spouse/patient to direct future questions to contact person at [employer] as they will be more equipped to answer them accurately.   Patient Self Care Activities:  . Self administers medications as prescribed . Performs ADL's independently . Performs IADL's independently  Please see past updates related to this goal by clicking on the "Past Updates" button in the selected goal         Patient verbalizes understanding of instructions provided today.   The patient has been provided with contact information for the care management team and has been advised to call with any health/community related questions or concerns.      Nayel Purdy, BSW Embedded Care Coordination Social Worker [facility] [phone]                              

## 2020-01-04 NOTE — Progress Notes (Signed)
Internal Medicine Clinic Resident  I have personally reviewed this encounter including the documentation in this note and/or discussed this patient with the care management provider. I will address any urgent items identified by the care management provider and will communicate my actions to the patient's PCP. I have reviewed the patient's CCM visit with my supervising attending, Dr Hoffman.  Vasili Venera Privott, MD 01/04/2020   

## 2020-01-04 NOTE — Chronic Care Management (AMB) (Signed)
  Care Management   Follow Up Note   01/04/2020 Name: Arthur [name] MRN: [id number] DOB: 1960-09-20  Referred by: Dewayne Hatch, MD Reason for referral : Care Coordination (Disability application )   SHAFIQ LARCH is a 59 y.o. year old male who is a primary care patient of [name], [name], MD. The care management team was consulted for assistance with care management and care coordination needs.    Review of patient status, including review of consultants reports, relevant laboratory and other test results, and collaboration with appropriate care team members and the patient's provider was performed as part of comprehensive patient evaluation and provision of chronic care management services.    SDOH (Social Determinants of Health) assessments performed: No See Care Plan activities for detailed interventions related to [facility])     Advanced Directives: See Care Plan and Vynca application for related entries.   Goals Addressed              This Visit's Progress   .  "I want to apply for disability" (pt-stated)        Current Barriers:  [name] Knowledge Barriers related to resources available to address lack of income due to inability to work- met with patient and his wife and their disabled son in the clinic after patient completed his financial counseling appointment with Remi Haggard  Case Manager Clinical Goal(s):  [name] Over the next 180 days, patient will work with BSW to address needs related to financial constraints; applying for disability  . Over the next 180 days, BSW will collaborate with RN Care Manager to address care management and care coordination needs  Interventions:  . Answered questions that patient's spouse had about letter received from [place]. . Encouraged spouse/patient to direct future questions to contact person at [facility] as they will be more equipped to answer them accurately.   Patient Self Care Activities:  . Self administers  medications as prescribed . Performs ADL's independently . Performs IADL's independently  Please see past updates related to this goal by clicking on the "Past Updates" button in the selected goal          The patient has been provided with contact information for the care management team and has been advised to call with any health/community related questions or concerns.    Ronn Melena, [place] Coordination Social Worker [place] [phone]

## 2020-01-07 ENCOUNTER — Ambulatory Visit: Payer: Self-pay | Admitting: *Deleted

## 2020-01-07 DIAGNOSIS — I10 Essential (primary) hypertension: Secondary | ICD-10-CM

## 2020-01-07 DIAGNOSIS — I639 Cerebral infarction, unspecified: Secondary | ICD-10-CM

## 2020-01-07 NOTE — Patient Instructions (Signed)
Visit Information It was nice speaking with you today. Please take your blood pressure more often, at least 3 times weekly, and any time you do not feel well. Please make a follow up appointment at the clinic  ASAP.   Goals Addressed              This Visit's Progress        .  "I'm not checking my blood pressure on a regular basis" (pt-stated)        Objective:  . Last practice recorded BP readings:  BP Readings from Last 3 Encounters:  11/19/19 (!) 143/88  10/08/19 (!) 142/92  10/05/19 (!) 176/112 .   [name] Most recent eGFR/CrCl: No results found for: EGFR  No components found for: CRCL  Current Barriers:  [name] Knowledge Deficits related to basic understanding of hypertension pathophysiology and self care management- spoke with patient and his wife to complete follow up assessment, wife states they are not checking patient's blood pressure on a regular basis but when they have checked the numbers are lower when they check it a second time, patient states he is taking his medications as prescribed  Case Manager Clinical Goal(s):  [name] Over the next 30-60 days, patient will demonstrate improved adherence to prescribed treatment plan for hypertension as evidenced by taking all medications as prescribed, monitoring and recording blood pressure as directed, adhering to low sodium/DASH diet . Over the next 30-60 days, patient will demonstrate improved health management independence as evidenced by checking blood pressure as directed and notifying PCP if SBP>180 or DBP > 100 taking all medications as prescribe, and adhering to a low sodium diet as discussed.  Interventions:  . Evaluation of current treatment plan related to hypertension self management and patient's adherence to plan as established by provider. . Provided education to patient re: stroke prevention, s/s of heart attack and stroke, DASH diet, complications of uncontrolled blood pressure- discussed importance of monitoring blood  pressure regularly with goal of meeting targets , also discussed role of low dose aspirin in stroke prevention Provided opportunity for patient and wife to ask questions about information contained in " Blood Pressure Control" booklet . Provided patient with [place] [facility] spiral bound calendar with HTN education including action plan and stop light information and log sheet to record blood pressure readings . Assessed home blood pressure readings and reviewed blood pressure targets . Reviewed medications and assessed medication taking behavior . Discussed plans with patient for ongoing care management follow up and provided patient with direct contact information for care management team . Advised patient, providing education and rationale, to monitor blood pressure daily and record, calling PCP for findings outside established parameters.  . Reviewed scheduled/upcoming provider appointments - encouraged patient to make follow up clinic appointment as provider wanted to see him back in 4 weeks when he was seen on June 24 so he is over due to re-assessment, suggested he bring his blood pressure monitor with him to his clinic appointment    Patient Self Care Activities:  . UNABLE to independently manage HTN . Self administers medications as prescribed . Attends all scheduled provider appointments . Calls provider office for new concerns, questions, or BP outside discussed parameters . Checks BP and records as discussed . Follows a low sodium diet/DASH diet  Please see past updates related to this goal by clicking on the "Past Updates" button in the selected goal          The patient  verbalized understanding of instructions provided today and declined a print copy of patient instruction materials.   The care management team will reach out to the patient again over the next 30-60 days.   Kelli Churn RN, CCM, [facility] RN Care Manager [phone]

## 2020-01-07 NOTE — Chronic Care Management (AMB) (Signed)
Care Management   Follow Up Note   01/07/2020 Name: Arthur [name] MRN: [id number] DOB: 1960/10/17  Referred by: Arthur Hatch, MD Reason for referral : Care Coordination (HTN, s/p cva)   Arthur [name] is a 59 y.o. year old male who is a primary care patient of Arthur [name], Arthur Rue, MD. The care management team was consulted for assistance with care management and care coordination needs.    Review of patient status, including review of consultants reports, relevant laboratory and other test results, and collaboration with appropriate care team members and the patient's provider was performed as part of comprehensive patient evaluation and provision of chronic care management services.    SDOH (Social Determinants of Health) assessments performed: No See Care Plan activities for detailed interventions related to [facility])     Advanced Directives: See Care Plan and Vynca application for related entries.   Goals Addressed              This Visit's Progress        .  "I'm not checking my blood pressure on a regular basis" (pt-stated)        Objective:  . Last practice recorded BP readings:  BP Readings from Last 3 Encounters:  11/19/19 (!) 143/88  10/08/19 (!) 142/92  10/05/19 (!) 176/112 .   [name] Most recent eGFR/CrCl: No results found for: EGFR  No components found for: CRCL  Current Barriers:  [name] Knowledge Deficits related to basic understanding of hypertension pathophysiology and self care management- spoke with patient and his wife to complete follow up assessment, wife states they are not checking patient's blood pressure on a regular basis but when they have checked the numbers are lower when they check it a second time, patient states he is taking his medications as prescribed  Case Manager Clinical Goal(s):  [name] Over the next 30-60 days, patient will demonstrate improved adherence to prescribed treatment plan for hypertension as evidenced by taking all medications as  prescribed, monitoring and recording blood pressure as directed, adhering to low sodium/DASH diet . Over the next 30-60 days, patient will demonstrate improved health management independence as evidenced by checking blood pressure as directed and notifying PCP if SBP>180 or DBP > 100 taking all medications as prescribe, and adhering to a low sodium diet as discussed.  Interventions:  . Evaluation of current treatment plan related to hypertension self management and patient's adherence to plan as established by provider. . Provided education to patient re: stroke prevention, s/s of heart attack and stroke, DASH diet, complications of uncontrolled blood pressure- discussed importance of monitoring blood pressure regularly with goal of meeting targets , also discussed role of low dose aspirin in stroke prevention Provided opportunity for patient and wife to ask questions about information contained in " Blood Pressure Control" booklet . Provided patient with [place] [facility] spiral bound calendar with HTN education including action plan and stop light information and log sheet to record blood pressure readings . Assessed home blood pressure readings and reviewed blood pressure targets . Reviewed medications and assessed medication taking behavior . Discussed plans with patient for ongoing care management follow up and provided patient with direct contact information for care management team . Advised patient, providing education and rationale, to monitor blood pressure daily and record, calling PCP for findings outside established parameters.  . Reviewed scheduled/upcoming provider appointments - encouraged patient to make follow up clinic appointment as provider wanted to see him back in 4 weeks when  he was seen on June 24 so he is over due to re-assessment, suggested he bring his blood pressure monitor with him to his clinic appointment    Patient Self Care Activities:  . UNABLE  to independently manage HTN . Self administers medications as prescribed . Attends all scheduled provider appointments . Calls provider office for new concerns, questions, or BP outside discussed parameters . Checks BP and records as discussed . Follows a low sodium diet/DASH diet  Please see past updates related to this goal by clicking on the "Past Updates" button in the selected goal           The care management team will reach out to the patient again over the next 30-60 days.   Kelli Churn RN, CCM, [facility] RN Care Manager [phone]

## 2020-01-08 NOTE — Progress Notes (Signed)
Internal Medicine Clinic Resident  I have personally reviewed this encounter including the documentation in this note and/or discussed this patient with the care management provider. I will address any urgent items identified by the care management provider and will communicate my actions to the patient's PCP. I have reviewed the patient's CCM visit with my supervising attending, Dr Hoffman.  Vasili Jiyah Torpey, MD 01/08/2020    

## 2020-01-11 ENCOUNTER — Encounter: Payer: Self-pay | Admitting: Internal Medicine

## 2020-01-11 ENCOUNTER — Other Ambulatory Visit: Payer: Self-pay | Admitting: Internal Medicine

## 2020-01-11 ENCOUNTER — Ambulatory Visit (INDEPENDENT_AMBULATORY_CARE_PROVIDER_SITE_OTHER): Payer: Self-pay | Admitting: Internal Medicine

## 2020-01-11 VITALS — BP 145/88 | HR 75 | Temp 98.1°F | Ht 63.0 in | Wt 139.2 lb

## 2020-01-11 DIAGNOSIS — I639 Cerebral infarction, unspecified: Secondary | ICD-10-CM

## 2020-01-11 DIAGNOSIS — I10 Essential (primary) hypertension: Secondary | ICD-10-CM

## 2020-01-11 MED ORDER — LISINOPRIL 40 MG PO TABS: 40.0000 mg | ORAL_TABLET | Freq: Every day | ORAL | 0 refills | Status: DC

## 2020-01-11 MED FILL — LISINOPRIL 40 MG TABS: 40 | 30 days supply | Qty: 30 | Fill #0

## 2020-01-11 NOTE — Assessment & Plan Note (Addendum)
BP today 148/91. Rechecked in 20 minutes: 145/88  Per his wife, the do not check BP at home more than once a week. Not sure what the number was but thinks that it was normal. Reports compliance to Amlodipine 10 mg QD and Lisinopril 20 mg QD.   BP goal 120/80 or less.  -Increase Lisinopril to 40 mg QD -Continue Amlodipine 10 mg QD -BMP  -Encourage patient and his wife to check his BP at home very day and bring the BP log with him next time.  -F/u in 2-4 weeks. (Patient does not have insurance yet. If he check BP at home regularly and can provide the numbers over the phone, we can arranges televisit in 2 weeks for f/u.)

## 2020-01-11 NOTE — Progress Notes (Signed)
   CC: HTN follow up HPI:  [name] is a 59 y.o. male with PMHx as documented below, presented for HTN f/u. Please refer to problem based charting for further details and assessment and plan of current problem and chronic medical conditions.   Past medical history: Stroke, HTN, history of ethanol use disorder  Medications: Amlodipine 10, aspirin 81, Lipitor 80, lisinopril 20 mg daily, folic acid, thiamine  Past Medical History:  Diagnosis Date  . ETOH abuse   . Hypertension    Review of Systems:  Constitutional: Negative for chills and fever.  Respiratory: Negative for shortness of breath.   Cardiovascular: Negative for chest pain and leg swelling.  Gastrointestinal: Negative for abdominal pain, nausea and vomiting.  Neurological: Negative for dizziness and headaches.   Physical Exam:  Vitals:   01/11/20 0908  BP: (!) 148/91  Pulse: 81  Temp: 98.1 F (36.7 C)  TempSrc: Oral  SpO2: 100%  Weight: 139 lb 3.2 oz (63.1 kg)  Height: 5\' 3"  (1.6 m)   Constitutional: No acute distress.  HENT: Wearing mask Cardiovascular: RRR, nl S1S2, no murmur,  no LEE Respiratory: Effort normal and breath sounds normal. No respiratory distress. No wheezes.  GI: Soft. No distension. There is no tenderness.  Neurological: Is alert and oriented at baseline Skin: Not diaphoretic. No erythema.  Psychiatric:  Normal mood and affect  Assessment & Plan:   See Encounters Tab for problem based charting.  Patient discussed with Dr. 

## 2020-01-11 NOTE — Progress Notes (Signed)
Internal Medicine Clinic Attending  Case discussed with Dr. [name] at the time of the visit.  We reviewed the resident's history and exam and pertinent patient test results.  I agree with the assessment, diagnosis, and plan of care documented in the resident's note.  Campbell Kray, M.D., Ph.D.  

## 2020-01-11 NOTE — Assessment & Plan Note (Signed)
Patient has not been able to follow up with neurologist after recent stroke due to lack of insurance, but reports compliance to ASA, Lipitor and BP meds. -Continue ASA Lipitor and antihypertensive meds -Counseled with adherence to DASH diet

## 2020-01-11 NOTE — Patient Instructions (Signed)
Thank you for allowing [place] to provide your care today. You came in for follow-up of your blood pressure. You blood pressure today was better than yesterday but still elevated: (!) 145/88  1-I increase lisinopril dose to 40 mg daily. (Pick up the new prescription for lisinopril 40 mg and take 1 tablet daily.   2-Continue taking amlodipine, statin and aspirin and your vitamins.  I have ordered some labs for you. I will call if any are abnormal.   Please check your blood pressure once or twice a day and write it in the log I gave you.  Bring it with you on next visit. Please come back to clinic in 2 weeks (or a telephone visit appointment if you were able to check and document your blood pressure at least once a day every day).   Should you have any questions or concerns please call the internal medicine clinic at [phone].    Thank you!

## 2020-01-12 LAB — BMP8+ANION GAP
Anion Gap: 13 mmol/L (ref 10.0–18.0)
BUN/Creatinine Ratio: 14 (ref 9–20)
BUN: 14 mg/dL (ref 6–24)
CO2: 23 mmol/L (ref 20–29)
Calcium: 9.4 mg/dL (ref 8.7–10.2)
Chloride: 102 mmol/L (ref 96–106)
Creatinine, Ser: 1.02 mg/dL (ref 0.76–1.27)
GFR calc Af Amer: 95 mL/min/{1.73_m2} (ref >59)
GFR calc non Af Amer: 82 mL/min/{1.73_m2} (ref >59)
Glucose: 102 mg/dL — ABNORMAL HIGH (ref 65–99)
Potassium: 4.6 mmol/L (ref 3.5–5.2)
Sodium: 138 mmol/L (ref 134–144)

## 2020-01-13 ENCOUNTER — Telehealth: Payer: Self-pay

## 2020-01-18 MED FILL — ATORVASTATIN 80 MG TABLET: 80 | 30 days supply | Qty: 30 | Fill #2

## 2020-01-18 MED FILL — AMLODIPINE BESYLATE 10 MG T: 10 | 30 days supply | Qty: 30 | Fill #2

## 2020-01-25 ENCOUNTER — Ambulatory Visit: Payer: Self-pay

## 2020-01-25 DIAGNOSIS — I639 Cerebral infarction, unspecified: Secondary | ICD-10-CM

## 2020-01-25 DIAGNOSIS — I10 Essential (primary) hypertension: Secondary | ICD-10-CM

## 2020-01-25 NOTE — Chronic Care Management (AMB) (Signed)
  Care Management   Follow Up Note   01/25/2020 Name: Arthur [name] MRN: [id number] DOB: 10-Jun-1960  Referred by: Dewayne Hatch, MD Reason for referral : Care Coordination (Disability application )   Arthur [name] is a 59 y.o. year old male who is a primary care patient of [name], [name], MD. The care management team was consulted for assistance with care management and care coordination needs.    Review of patient status, including review of consultants reports, relevant laboratory and other test results, and collaboration with appropriate care team members and the patient's provider was performed as part of comprehensive patient evaluation and provision of chronic care management services.    SDOH (Social Determinants of Health) assessments performed: No See Care Plan activities for detailed interventions related to [facility])     Advanced Directives: See Care Plan and Vynca application for related entries.   Goals Addressed              This Visit's Progress   .  COMPLETED: "I want to apply for disability" (pt-stated)        Current Barriers:  [name] Knowledge Barriers related to resources available to address lack of income due to inability to work- met with patient and his wife and their disabled son in the clinic after patient completed his financial counseling appointment with Remi Haggard  Case Manager Clinical Goal(s):  [name] Over the next 180 days, patient will work with BSW to address needs related to financial constraints; applying for disability  . Over the next 180 days, BSW will collaborate with RN Care Manager to address care management and care coordination needs  Interventions:  . Received "proof of submission form" for disability application from Arrie Senate with [place].   Patient Self Care Activities:  . Self administers medications as prescribed . Performs ADL's independently . Performs IADL's independently  Please see past  updates related to this goal by clicking on the "Past Updates" button in the selected goal      .  COMPLETED: Patient stated to BSW, "I want to apply for disability" (pt-stated)        Current Barriers: . Chronic Disease Management support, education, and care coordination needs related to: knowledge Barriers related to resources available to address lack of income due to inability to work.- met with patient and his wife and their disabled son in the clinic after patient completed his financial counseling appointment with Remi Haggard  Case Manager Clinical Goal(s): [name] Over the next 180 days, patient will work with BSW to address needs related to financial constraints; applying for disability  . Over the next 180 days, BSW will collaborate with RN Care Manager to address care management and care coordination needs   Interventions: . Received "proof of submission form" for disability application from Arrie Senate with [place].   Patient Self Care Activities:  . Performs ADL's independently . Performs IADL's independently  Please see past updates related to this goal by clicking on the "Past Updates" button in the selected goal          CCM BSW will reach out to the patient again over the next 60 days regarding status of disability application.     Ronn Melena, [place] Coordination Social Worker [place] [phone]

## 2020-01-25 NOTE — Patient Instructions (Signed)
Visit Information  Goals Addressed              This Visit's Progress   .  COMPLETED: "I want to apply for disability" (pt-stated)        Current Barriers:  [name] Knowledge Barriers related to resources available to address lack of income due to inability to work- met with patient and his wife and their disabled son in the clinic after patient completed his financial counseling appointment with Remi Haggard  Case Manager Clinical Goal(s):  [name] Over the next 180 days, patient will work with BSW to address needs related to financial constraints; applying for disability  . Over the next 180 days, BSW will collaborate with RN Care Manager to address care management and care coordination needs  Interventions:  . Received "proof of submission form" for disability application from Arrie Senate with [place].   Patient Self Care Activities:  . Self administers medications as prescribed . Performs ADL's independently . Performs IADL's independently  Please see past updates related to this goal by clicking on the "Past Updates" button in the selected goal      .  COMPLETED: Patient stated to BSW, "I want to apply for disability" (pt-stated)        Current Barriers: . Chronic Disease Management support, education, and care coordination needs related to: knowledge Barriers related to resources available to address lack of income due to inability to work.- met with patient and his wife and their disabled son in the clinic after patient completed his financial counseling appointment with Remi Haggard  Case Manager Clinical Goal(s): [name] Over the next 180 days, patient will work with BSW to address needs related to financial constraints; applying for disability  . Over the next 180 days, BSW will collaborate with RN Care Manager to address care management and care coordination needs   Interventions: . Received "proof of submission form" for disability application from Arrie Senate with [place].   Patient Self Care Activities:  . Performs ADL's independently . Performs IADL's independently  Please see past updates related to this goal by clicking on the "Past Updates" button in the selected goal       CCM BSW will reach out to the patient again over the next 60 days regarding status of disability application.     Ronn Melena, [place] Coordination Social Worker [place] [phone]

## 2020-01-25 NOTE — Progress Notes (Signed)
Internal Medicine Clinic Resident  I have personally reviewed this encounter including the documentation in this note and/or discussed this patient with the care management provider. I will address any urgent items identified by the care management provider and will communicate my actions to the patient's PCP. I have reviewed the patient's CCM visit with my supervising attending, Dr Hoffman.  Danne Scardina, MD 01/25/2020    

## 2020-02-05 ENCOUNTER — Ambulatory Visit: Payer: Self-pay | Admitting: *Deleted

## 2020-02-05 DIAGNOSIS — I10 Essential (primary) hypertension: Secondary | ICD-10-CM

## 2020-02-05 DIAGNOSIS — I639 Cerebral infarction, unspecified: Secondary | ICD-10-CM

## 2020-02-05 NOTE — Chronic Care Management (AMB) (Signed)
Care Management   Follow Up Note   02/05/2020 Name: Arthur [name] MRN: [id number] DOB: 1960-06-19  Referred by: Dewayne Hatch, MD Reason for referral : Care Coordination (HTN)   Arthur [name] is a 59 y.o. year old male who is a primary care patient of [name], [name], MD. The care management team was consulted for assistance with care management and care coordination needs.    Review of patient status, including review of consultants reports, relevant laboratory and other test results, and collaboration with appropriate care team members and the patient's provider was performed as part of comprehensive patient evaluation and provision of chronic care management services.    SDOH (Social Determinants of Health) assessments performed: No See Care Plan activities for detailed interventions related to [facility])     Advanced Directives: See Care Plan and Vynca application for related entries.   Goals Addressed              This Visit's Progress     Patient Stated   .  "I'm not checking my blood pressure on a regular basis" (pt-stated)        Objective:  . Last practice recorded BP readings:  BP Readings from Last 3 Encounters:  11/19/19 (!) 143/88  10/08/19 (!) 142/92  10/05/19 (!) 176/112 .   [name] Most recent eGFR/CrCl: No results found for: EGFR  No components found for: CRCL  Current Barriers:  [name] Knowledge Deficits related to basic understanding of hypertension pathophysiology and self care management- spoke with patient and his wife to complete follow up assessment, wife provided the following BP readings from past to most recent: 174/102, 133/96, 125/79, 122/83, 127/90 and 128/88, patient states he is taking his medications as prescribed and acknowledges he is taking the increased dose of 40 mg of lisinopril  Case Manager Clinical Goal(s):  [name] Over the next 30-60 days, patient will demonstrate improved adherence to prescribed treatment plan for hypertension as evidenced  by taking all medications as prescribed, monitoring and recording blood pressure as directed, adhering to low sodium/DASH diet . Over the next 30-60 days, patient will demonstrate improved health management independence as evidenced by checking blood pressure as directed and notifying PCP if SBP>180 or DBP > 100 taking all medications as prescribe, and adhering to a low sodium diet as discussed.  Interventions:  . Evaluation of current treatment plan related to hypertension self management and patient's adherence to plan as established by provider. . Provided education to patient re: stroke prevention, s/s of heart attack and stroke, DASH diet, complications of uncontrolled blood pressure- discussed importance of monitoring blood pressure regularly with goal of meeting targets , also discussed role of low dose aspirin in stroke prevention Provided opportunity for patient and wife to ask questions about information contained in " Blood Pressure Control" booklet . Assessed home blood pressure readings and reviewed blood pressure targets . Reviewed medications and assessed medication taking behavior . Advised patient, providing education and rationale, to monitor blood pressure daily and record, calling PCP for findings outside established parameters.  . Reviewed scheduled/upcoming provider appointments - encouraged patient to make follow up clinic appointment as provider wanted to see him back in 2 weeks when he was seen on 8/16 . Discussed plans with patient for ongoing care management follow up and ensured patient/wife have direct contact information for care management team  Patient Self Care Activities:  . UNABLE to independently manage HTN . Self administers medications as prescribed . Attends all scheduled provider appointments .  Calls provider office for new concerns, questions, or BP outside discussed parameters . Checks BP and records as discussed . Follows a low sodium diet/DASH  diet  Please see past updates related to this goal by clicking on the "Past Updates" button in the selected goal           The care management team will reach out to the patient again over the next 30-60 days.   Kelli Churn RN, CCM, [facility] RN Care Manager [phone]

## 2020-02-05 NOTE — Patient Instructions (Signed)
Visit Information It was nice speaking with you today. Please continue to monitor your blood pressure every day if possible preferably not within one hour of waking.   Goals Addressed              This Visit's Progress     Patient Stated   .  "I'm not checking my blood pressure on a regular basis" (pt-stated)        Objective:  . Last practice recorded BP readings:  BP Readings from Last 3 Encounters:  11/19/19 (!) 143/88  10/08/19 (!) 142/92  10/05/19 (!) 176/112 .   [name] Most recent eGFR/CrCl: No results found for: EGFR  No components found for: CRCL  Current Barriers:  [name] Knowledge Deficits related to basic understanding of hypertension pathophysiology and self care management- spoke with patient and his wife to complete follow up assessment, wife provided the following BP readings from past to most recent: 174/102, 133/96, 125/79, 122/83, 127/90 and 128/88, patient states he is taking his medications as prescribed and acknowledges he is taking the increased dose of 40 mg of lisinopril  Case Manager Clinical Goal(s):  [name] Over the next 30-60 days, patient will demonstrate improved adherence to prescribed treatment plan for hypertension as evidenced by taking all medications as prescribed, monitoring and recording blood pressure as directed, adhering to low sodium/DASH diet . Over the next 30-60 days, patient will demonstrate improved health management independence as evidenced by checking blood pressure as directed and notifying PCP if SBP>180 or DBP > 100 taking all medications as prescribe, and adhering to a low sodium diet as discussed.  Interventions:  . Evaluation of current treatment plan related to hypertension self management and patient's adherence to plan as established by provider. . Provided education to patient re: stroke prevention, s/s of heart attack and stroke, DASH diet, complications of uncontrolled blood pressure- discussed importance of monitoring blood pressure  regularly with goal of meeting targets , also discussed role of low dose aspirin in stroke prevention Provided opportunity for patient and wife to ask questions about information contained in " Blood Pressure Control" booklet . Assessed home blood pressure readings and reviewed blood pressure targets . Reviewed medications and assessed medication taking behavior . Advised patient, providing education and rationale, to monitor blood pressure daily and record, calling PCP for findings outside established parameters.  . Reviewed scheduled/upcoming provider appointments - encouraged patient to make follow up clinic appointment as provider wanted to see him back in 2 weeks when he was seen on 8/16 . Discussed plans with patient for ongoing care management follow up and ensured patient/wife have direct contact information for care management team . Wished patient a belated Happy Birthday  Patient Self Care Activities:  . UNABLE to independently manage HTN . Self administers medications as prescribed . Attends all scheduled provider appointments . Calls provider office for new concerns, questions, or BP outside discussed parameters . Checks BP and records as discussed . Follows a low sodium diet/DASH diet  Please see past updates related to this goal by clicking on the "Past Updates" button in the selected goal          The patient verbalized understanding of instructions provided today and declined a print copy of patient instruction materials.   The care management team will reach out to the patient again over the next 30-60 days.   Kelli Churn RN, CCM, [facility] RN Care Manager [phone]

## 2020-02-09 ENCOUNTER — Ambulatory Visit: Payer: Self-pay | Attending: Internal Medicine

## 2020-02-09 DIAGNOSIS — Z23 Encounter for immunization: Secondary | ICD-10-CM

## 2020-02-09 NOTE — Progress Notes (Signed)
   Covid-19 Vaccination Clinic  Name:  ALLISTER LESSLEY    MRN: [id number] DOB: 1961-03-10  02/09/2020  Mr. Lefevers was observed post Covid-19 immunization for 15 minutes without incident. He was provided with Vaccine Information Sheet and instruction to access the V-Safe system.   Mr. Simington was instructed to call 911 with any severe reactions post vaccine: [name] Difficulty breathing  . Swelling of face and throat  . A fast heartbeat  . A bad rash all over body  . Dizziness and weakness   Immunizations Administered    Name Date Dose VIS Date Route   Pfizer COVID-19 Vaccine 02/09/2020  9:55 AM 0.3 mL 07/22/2018 Intramuscular   Manufacturer: [employer], [employer]   Lot: [id number]   NDC: [id number]

## 2020-02-17 MED FILL — ATORVASTATIN 80 MG TABLET: 80 | 30 days supply | Qty: 30 | Fill #3

## 2020-02-17 MED FILL — LISINOPRIL 40 MG TABS: 40 | 30 days supply | Qty: 30 | Fill #1

## 2020-02-17 MED FILL — AMLODIPINE BESYLATE 10 MG T: 10 | 30 days supply | Qty: 30 | Fill #3

## 2020-03-01 ENCOUNTER — Ambulatory Visit: Payer: Self-pay | Admitting: *Deleted

## 2020-03-01 DIAGNOSIS — I10 Essential (primary) hypertension: Secondary | ICD-10-CM

## 2020-03-01 DIAGNOSIS — I639 Cerebral infarction, unspecified: Secondary | ICD-10-CM

## 2020-03-01 NOTE — Chronic Care Management (AMB) (Signed)
Care Management   Follow Up Note   03/01/2020 Name: Arthur [name] MRN: [id number] DOB: 1960/12/11  Referred by: Dewayne Hatch, MD Reason for referral : Care Coordination (HTN. s/p stroke)   Arthur [name] is a 59 y.o. year old male who is a primary care patient of [name], [name], MD. The care management team was consulted for assistance with care management and care coordination needs.    Review of patient status, including review of consultants reports, relevant laboratory and other test results, and collaboration with appropriate care team members and the patient's provider was performed as part of comprehensive patient evaluation and provision of chronic care management services.    SDOH (Social Determinants of Health) assessments performed: No See Care Plan activities for detailed interventions related to [facility])     Advanced Directives: See Care Plan and Vynca application for related entries.   Goals Addressed              This Visit's Progress     Patient Stated   .  "I'm not checking my blood pressure on a regular basis" (pt-stated)        Objective:  [name] BP Readings from Last 3 Encounters:  01/11/20 . (!) 145/88  11/19/19 . (!) 143/88  10/08/19 . (!) 142/92    . Most recent eGFR/CrCl: No results found for: EGFR  No components found for: CRCL  Current Barriers:  [name] Knowledge Deficits related to basic understanding of hypertension pathophysiology and self care management- spoke with patient and his wife to complete follow up assessment, wife and patient and wife state the 41 of self monitored blood pressure readings are meeting target however they are struggling to pay for his medications  Case Manager Clinical Goal(s):  [name] Over the next 30-60 days, patient will demonstrate improved adherence to prescribed treatment plan for hypertension as evidenced by taking all medications as prescribed, monitoring and recording blood pressure as directed, adhering to  low sodium/DASH diet . Over the next 30-60 days, patient will demonstrate improved health management independence as evidenced by checking blood pressure as directed and notifying PCP if SBP>180 or DBP > 100 taking all medications as prescribe, and adhering to a low sodium diet as discussed.  Interventions:  . Evaluation of current treatment plan related to hypertension self management and patient's adherence to plan as established by provider. . Assessed home blood pressure readings and reviewed blood pressure targets . Reviewed medications and assessed medication taking behavior . Advised patient, providing education and rationale, to monitor blood pressure daily and record, calling PCP for findings outside established parameters.  Bertram Savin care team collaboration- referral place to clinic pharmacy technician Cheryle Horsfall for assistance with medication procurement . Reviewed scheduled/upcoming provider appointments - again encouraged patient to make follow up clinic appointment as provider wanted to see him back in 2 weeks when he was seen on 8/16 . Discussed plans with patient for ongoing care management follow up and ensured patient/wife have direct contact information for care management team  Patient Self Care Activities:  . UNABLE to independently manage HTN . Self administers medications as prescribed . Attends all scheduled provider appointments . Calls provider office for new concerns, questions, or BP outside discussed parameters . Checks BP and records as discussed . Follows a low sodium diet/DASH diet  Please see past updates related to this goal by clicking on the "Past Updates" button in the selected goal           The  care management team will reach out to the patient again over the next 30-60 days.   Kelli Churn RN, CCM, [facility] RN Care Manager [phone]

## 2020-03-01 NOTE — Patient Instructions (Signed)
Visit Information It was nice speaking with you today. I will ask the clinic pharmacy technician to reach out to you to help you with getting your medications. Goals Addressed              This Visit's Progress     Patient Stated   .  "I'm not checking my blood pressure on a regular basis" (pt-stated)        Objective:  [name] BP Readings from Last 3 Encounters: .  01/11/20 . (!) 145/88 .  11/19/19 . (!) 143/88 .  10/08/19 . (!) 142/92    . Most recent eGFR/CrCl: No results found for: EGFR  No components found for: CRCL  Current Barriers:  [name] Knowledge Deficits related to basic understanding of hypertension pathophysiology and self care management- spoke with patient and his wife to complete follow up assessment, wife and patient and wife state the 72 of self monitored blood pressure readings are meeting target however they are struggling to pay for his medications  Case Manager Clinical Goal(s):  [name] Over the next 30-60 days, patient will demonstrate improved adherence to prescribed treatment plan for hypertension as evidenced by taking all medications as prescribed, monitoring and recording blood pressure as directed, adhering to low sodium/DASH diet . Over the next 30-60 days, patient will demonstrate improved health management independence as evidenced by checking blood pressure as directed and notifying PCP if SBP>180 or DBP > 100 taking all medications as prescribe, and adhering to a low sodium diet as discussed.  Interventions:  . Evaluation of current treatment plan related to hypertension self management and patient's adherence to plan as established by provider. . Assessed home blood pressure readings and reviewed blood pressure targets . Reviewed medications and assessed medication taking behavior . Advised patient, providing education and rationale, to monitor blood pressure daily and record, calling PCP for findings outside established parameters.  Arthur [name]  care team collaboration- referral place to clinic pharmacy technician Arthur [name] for assistance with medication procurement . Reviewed scheduled/upcoming provider appointments - again encouraged patient to make follow up clinic appointment as provider wanted to see him back in 2 weeks when he was seen on 8/16 . Discussed plans with patient for ongoing care management follow up and ensured patient/wife have direct contact information for care management team  Patient Self Care Activities:  . UNABLE to independently manage HTN . Self administers medications as prescribed . Attends all scheduled provider appointments . Calls provider office for new concerns, questions, or BP outside discussed parameters . Checks BP and records as discussed . Follows a low sodium diet/DASH diet  Please see past updates related to this goal by clicking on the "Past Updates" button in the selected goal          The patient verbalized understanding of instructions provided today and declined a print copy of patient instruction materials.   The care management team will reach out to the patient again over the next 30-60 days.   Kelli Churn RN, CCM, [facility] RN Care Manager [phone]

## 2020-03-01 NOTE — Progress Notes (Addendum)
Internal Medicine Clinic Resident  I have personally reviewed this encounter including the documentation in this note and/or discussed this patient with the care management provider. I will address any urgent items identified by the care management provider and will communicate my actions to the patient's PCP. I have reviewed the patient's CCM visit with my supervising attending, Dr Williams.  Marillyn Goren, MD  [facility] PGY-2 03/01/2020    Internal Medicine Attending: I have reviewed and agree with documentation above. Julie A. Williams, MD 

## 2020-03-02 ENCOUNTER — Ambulatory Visit: Payer: Self-pay | Admitting: *Deleted

## 2020-03-02 DIAGNOSIS — I10 Essential (primary) hypertension: Secondary | ICD-10-CM

## 2020-03-02 DIAGNOSIS — I639 Cerebral infarction, unspecified: Secondary | ICD-10-CM

## 2020-03-02 NOTE — Chronic Care Management (AMB) (Signed)
  Care Management   Follow Up Note   03/02/2020 Name: Arthur [name] MRN: [id number] DOB: 1960-12-29  Referred by: Arthur Pretty, MD Reason for referral : Care Coordination ( HTN. s/p stroke)   Arthur [name] is a 59 y.o. year old male who is a primary care patient of [name], Shawna Orleans, MD. The care management team was consulted for assistance with care management and care coordination needs.    Review of patient status, including review of consultants reports, relevant laboratory and other test results, and collaboration with appropriate care team members and the patient's provider was performed as part of comprehensive patient evaluation and provision of chronic care management services.    SDOH (Social Determinants of Health) assessments performed: No See Care Plan activities for detailed interventions related to [facility])     Advanced Directives: See Care Plan and Vynca application for related entries.   Goals Addressed              This Visit's Progress     Patient Stated   .  "I'm having trouble paying for my medications." (pt-stated)        CARE PLAN ENTRY (see longitudinal plan of care for additional care plan information)  Current Barriers:  . Difficulty obtaining medications  Nurse Case Manager Clinical Goal(s):  [name] Over the next 30-60 days, patient will work with CCM team and clinic pharmacy technician to address barriers related to medication procurement.   Interventions:  . Inter-disciplinary care team collaboration (see longitudinal plan of care) . Pharmacy technician referral Arthur [name]) for assistance with medication procurement . Spoke with patient's significant other Arthur [name] to advise of free fill on amlodipine and atorvastatin and $4 copay for Lisinopril at the [facility] via the [facility] program for indigent patients and possible assistance with copay via [facility] Medicine Clinic petty cash fund of one time  $25 gift for medication copays  Patient Self Care Activities:  . Patient verbalizes understanding of plan to work with CCM team and clinic pharmacy technician to address medication procurement barriers . Self administers medications as prescribed . Attends all scheduled provider appointments . Calls pharmacy for medication refills . Calls provider office for new concerns or questions . Unable to independently procure medications  Initial goal documentation         The care management team will reach out to the patient again over the next 30-60 days.   Cranford Mon RN, CCM, [facility] Clinic RN Care Manager [phone]

## 2020-03-03 NOTE — Progress Notes (Signed)
Internal Medicine Clinic Resident  I have personally reviewed this encounter including the documentation in this note and/or discussed this patient with the care management provider. I will address any urgent items identified by the care management provider and will communicate my actions to the patient's PCP. I have reviewed the patient's CCM visit with my supervising attending, Dr Vincent.  Jazmina Muhlenkamp K Jeanet Lupe, MD 03/03/2020    

## 2020-03-16 ENCOUNTER — Ambulatory Visit: Payer: Self-pay | Admitting: *Deleted

## 2020-03-16 DIAGNOSIS — I639 Cerebral infarction, unspecified: Secondary | ICD-10-CM

## 2020-03-16 DIAGNOSIS — I10 Essential (primary) hypertension: Secondary | ICD-10-CM

## 2020-03-16 MED FILL — ?AMLODIPINE BESYLATE 5MG TA: 5 | 30 days supply | Qty: 60 | Fill #0

## 2020-03-16 MED FILL — LISINOPRIL 40 MG TABLET: 40 | 30 days supply | Qty: 30 | Fill #0

## 2020-03-16 MED FILL — ?ATORVASTATIN 40MG TABLET: 40 | 30 days supply | Qty: 60 | Fill #0

## 2020-03-16 NOTE — Chronic Care Management (AMB) (Signed)
  Care Management   Follow Up Note   03/16/2020 Name: Arthur [name] MRN: [id number] DOB: [birth year]  Referred by: Chevis Pretty, MD Reason for referral : Care Coordination ( HTN. s/p stroke)   Arthur [name] is a 59 y.o. year old male who is a primary care patient of [name], Shawna Orleans, MD. The care management team was consulted for assistance with care management and care coordination needs.    Review of patient status, including review of consultants reports, relevant laboratory and other test results, and collaboration with appropriate care team members and the patient's provider was performed as part of comprehensive patient evaluation and provision of chronic care management services.    SDOH (Social Determinants of Health) assessments performed: No See Care Plan activities for detailed interventions related to [facility])     Advanced Directives: See Care Plan and Vynca application for related entries.   Goals Addressed              This Visit's Progress     Patient Stated   .  "I'm having trouble paying for my medications." (pt-stated)        CARE PLAN ENTRY (see longitudinal plan of care for additional care plan information)  Current Barriers:  . Difficulty obtaining medications- patient's significant other ,[name], called and states patient needs refills on all of his medicines but they do not have any money for his medication copays  Nurse Case Manager Clinical Goal(s):  [name] Over the next 30-60 days, patient will work with CCM team and clinic pharmacy technician to address barriers related to medication procurement.   Interventions:  . Inter-disciplinary care team collaboration (see longitudinal plan of care) . Sent message to Pharmacy technician referral Mardee Postin) asking if patient can still get free fill on amlodipine and atorvastatin and $4 copay for Lisinopril at the [facility] clinic via the [facility] program for  indigent patients if the Internal Medicine Clinic covers any copay cost from the clinic's [employer] . Notified patient's significant other via phone that per Mardee Postin, clinic pharmacy technician, patient can pick up medicines including aspirin after 2 pm today at the [facility]. Patient's significant other voiced understanding and says she knows the location of the pharmacy. $4 copay will be covered by [facility].   Patient Self Care Activities:  . Patient verbalizes understanding of plan to work with CCM team and clinic pharmacy technician to address medication procurement barriers . Self administers medications as prescribed . Attends all scheduled provider appointments . Calls pharmacy for medication refills . Calls provider office for new concerns or questions . Unable to independently procure medications  Please see past updates related to this goal by clicking on the "Past Updates" button in the selected goal          The care management team will reach out to the patient again over the next 30-60 days.   Cranford Mon RN, CCM, [facility] Clinic RN Care Manager [phone]

## 2020-03-17 ENCOUNTER — Ambulatory Visit: Payer: Self-pay

## 2020-03-17 DIAGNOSIS — I639 Cerebral infarction, unspecified: Secondary | ICD-10-CM

## 2020-03-17 DIAGNOSIS — I10 Essential (primary) hypertension: Secondary | ICD-10-CM

## 2020-03-17 NOTE — Chronic Care Management (AMB) (Signed)
  Care Management   Follow Up Note   03/17/2020 Name: NAREN BENALLY MRN: [id number] DOB: 11-08-1960  Referred by: Chevis Pretty, MD Reason for referral : Care Coordination (Medicaid application )   KROY SPRUNG is a 59 y.o. year old male who is a primary care patient of [name], Shawna Orleans, MD. The care management team was consulted for assistance with care management and care coordination needs.    Review of patient status, including review of consultants reports, relevant laboratory and other test results, and collaboration with appropriate care team members and the patient's provider was performed as part of comprehensive patient evaluation and provision of chronic care management services.    SDOH (Social Determinants of Health) assessments performed: Yes See Care Plan activities for detailed interventions related to SDOH)  SDOH Interventions     Most Recent Value  SDOH Interventions  Financial Strain Interventions Other (Comment)  [referral to [facility] to assist with Medicaid application]       Advanced Directives: See Care Plan and Vynca application for related entries.   Goals Addressed              This Visit's Progress   .  "I want to apply for Medicaid" (pt-stated)        CARE PLAN ENTRY (see longitudinal plan of care for additional care plan information)  Current Barriers:  . Patient was denied for Social Security Disability Income and has continued difficulty with affording cost of medication.  [facility] had [facility] on [facility]14/19 but did not have necessary documentation to complete application for [facility].  Patient has not yet contacted [facility] for purpose of rescheduling this appointment.   Clinical Social Work Clinical Goal(s):  [name] Over the next 90 days, patient will work with SW to address concerns related to financial constraints; applying for Medicaid  Interventions: . Inter-disciplinary care  team collaboration (see longitudinal plan of care) . Received the following message from Christia Reading with [employer] regarding attempt to assist patient with application:  "  We did attempt to reach out to him to screen for Medicaid back in May and he did not respond to our contact attempts. His May DOS is past the retroactive timely filing for Medicaid.  Since he does not have a more current DOS (within the last 3 months), my team would not be able to assist him with an application at this time.  Steele Sizer with [facility] Financial Counselor, Rosemary Holms, regarding outcome of appointment on 12/14/19 . Contacted patient's spouse who states that patient has never applied for Medicaid so they would like assistance with this.  Rhina Brackett message to Layla Barter with [facility] to determine if they can assist at this time.  [name] Received response from Ms. Tanda Rockers that can assist with application and will reach out to patient.    Patient Self Care Activities:  . Self administers medications as prescribed . Attends all scheduled provider appointments . Calls provider office for new concerns or questions . Financial constraints  Initial goal documentation         The care management team will reach out to the patient again over the next 30 days.      [name], BSW Embedded Care Coordination Social Worker [facility] [phone]

## 2020-03-17 NOTE — Patient Instructions (Signed)
Visit Information  Goals Addressed              This Visit's Progress   .  "I want to apply for Medicaid" (pt-stated)        CARE PLAN ENTRY (see longitudinal plan of care for additional care plan information)  Current Barriers:  . Patient was denied for Social Security Disability Income and has continued difficulty with affording cost of medication.  Patient had [facility] counseling appointment at [facility] on 12/14/19 but did not have necessary documentation to complete application for [facility].  Patient has not yet contacted [facility] for purpose of rescheduling this appointment.   Clinical Social Work Clinical Goal(s):  [name] Over the next 90 days, patient will work with SW to address concerns related to financial constraints; applying for Medicaid  Interventions: . Inter-disciplinary care team collaboration (see longitudinal plan of care) . Received the following message from Christia Reading with [employer] regarding attempt to assist patient with application:  "  We did attempt to reach out to him to screen for Medicaid back in May and he did not respond to our contact attempts. His May DOS is past the retroactive timely filing for Medicaid.  Since he does not have a more current DOS (within the last 3 months), my team would not be able to assist him with an application at this time.  Steele Sizer with [facility] Financial Counselor, Rosemary Holms, regarding outcome of appointment on 12/14/19 . Contacted patient's spouse who states that patient has never applied for Medicaid so they would like assistance with this.  Rhina Brackett message to Layla Barter with [facility] to determine if they can assist at this time.  [name] Received response from Ms. Tanda Rockers that can assist with application and will reach out to patient.    Patient Self Care Activities:  . Self administers medications as prescribed . Attends all scheduled provider appointments . Calls provider office for new  concerns or questions . Financial constraints  Initial goal documentation        Patient verbalizes understanding of instructions provided today.   The care management team will reach out to the patient again over the next 30 days.      [name], BSW Embedded Care Coordination Social Worker [facility] [phone]

## 2020-03-18 ENCOUNTER — Encounter: Payer: Self-pay | Admitting: *Deleted

## 2020-03-18 NOTE — Progress Notes (Signed)
Yes OK to used the [facility] petty cash fund so Dr. Earlene Plater can refill medications

## 2020-03-21 NOTE — Progress Notes (Signed)
[facility] Resident  I have personally reviewed this encounter including the documentation in this note and/or discussed this patient with the care management provider. I will address any urgent items identified by the care management provider and will communicate my actions to the patient's PCP. I have reviewed the patient's CCM visit with my supervising attending, Dr Vincent.  Jeralynn Vaquera, MD 03/21/2020    

## 2020-03-21 NOTE — Progress Notes (Signed)
[facility] Attending  CCM services provided by the care management provider and their documentation were discussed with Dr. Coe. We reviewed the pertinent findings, urgent action items addressed by the resident and non-urgent items to be addressed by the PCP.  I agree with the assessment, diagnosis, and plan of care documented in the CCM and resident's note.  Inesha Sow Thomas Aidynn Krenn, MD 03/21/2020  

## 2020-03-25 ENCOUNTER — Telehealth: Payer: Self-pay

## 2020-04-01 ENCOUNTER — Telehealth: Payer: Self-pay

## 2020-04-11 ENCOUNTER — Ambulatory Visit: Payer: Self-pay | Admitting: *Deleted

## 2020-04-11 DIAGNOSIS — I639 Cerebral infarction, unspecified: Secondary | ICD-10-CM

## 2020-04-11 DIAGNOSIS — I10 Essential (primary) hypertension: Secondary | ICD-10-CM

## 2020-04-11 NOTE — Chronic Care Management (AMB) (Addendum)
Care Management   Follow Up Note   04/11/2020 Name: Arthur [name] MRN: [id number] DOB: 02/22/1961  [name] is enrolled in a Managed Medicaid plan: No. Outreach attempt today was successful.    Referred by: Dewayne Hatch, MD Reason for referral : Care Coordination (HTN. s/p stroke)   Arthur [name] is a 59 y.o. year old male who is a primary care patient of [name], [name], MD. The care management team was consulted for assistance with care management and care coordination needs.    Review of patient status, including review of consultants reports, relevant laboratory and other test results, and collaboration with appropriate care team members and the patient's provider was performed as part of comprehensive patient evaluation and provision of chronic care management services.    Goals Addressed              This Visit's Progress     Patient Stated   .  COMPLETED: "I want to apply for disability" (pt-stated)        Current Barriers:  [name] Knowledge Barriers related to resources available to address lack of income due to inability to work- 04/11/20- spoke with patient and wife via phone; they report they have not received a call about assistance with disability or Medicaid application  Case Manager Clinical Goal(s):  [name] Over the next 180 days, patient will work with BSW to address needs related to financial constraints; applying for disability  . Over the next 180 days, BSW will collaborate with RN Care Manager to address care management and care coordination needs  Interventions:  . Received "proof of submission form" for disability application from Arrie Senate with [employer].  . Updated Amber Chrismon CCM BSW of this CCM RN 's conversation with patient and his wife of today  Patient Self Care Activities:  . Self administers medications as prescribed . Performs ADL's independently . Performs IADL's independently  Please see past updates  related to this goal by clicking on the "Past Updates" button in the selected goal      .  "I want to apply for Medicaid" (pt-stated)        [place] (see longitudinal plan of care for additional care plan information)  Current Barriers:  . Patient was denied for [place] and has continued difficulty with affording cost of medication. - 04/11/20- spoke with patient and wife via phone; they report they have not received a call about assistance with disability or Medicaid application  Clinical Social Work Clinical Goal(s):  [name] Over the next 90 days, patient will work with SW to address concerns related to financial constraints; applying for Medicaid  Interventions: . Inter-disciplinary care team collaboration (see longitudinal plan of care) . Received the following message from Shanon Rosser with [employer] regarding attempt to assist patient with application:  "  We did attempt to reach out to him to screen for Medicaid back in May and he did not respond to our contact attempts. His May DOS is past the retroactive timely filing for Medicaid.  Since he does not have a more current DOS (within the last 3 months), my team would not be able to assist him with an application at this time.  Nash Dimmer with [facility] Financial Counselor, Collier Salina, regarding outcome of appointment on 12/14/19 . Contacted patient's spouse who states that patient has never applied for Medicaid so they would like assistance with this.  Durene Cal message to Arrie Senate with [facility] to determine if they can assist at this time.  [name] Received response from Ms. Nils Pyle that can assist with application and will reach out to patient.  . 04/11/20-Updated Amber Chrismon CCM BSW of this CCM RN 's conversation with patient and his wife of today   Patient Self Care Activities:  . Self administers medications as prescribed . Attends all scheduled provider appointments . Calls provider  office for new concerns or questions . Financial constraints  Initial goal documentation     .  "I'm not checking my blood pressure on a regular basis" (pt-stated)        Objective:  [name] BP Readings from Last 3 Encounters: .  01/11/20 . (!) 145/88 .  11/19/19 . (!) 143/88 .  10/08/19 . (!) 142/92    . Most recent eGFR/CrCl: No results found for: EGFR  No components found for: CRCL  Current Barriers:  [name] Knowledge Deficits related to basic understanding of hypertension pathophysiology and self care management- spoke with patient and his wife to complete follow up assessment, wife and patient and wife state the most recent self monitored blood pressure readings are meeting target with the last two readings reported as 98/64, and 108/72, patient reports good medication taking behavior  Case Manager Clinical Goal(s):  [name] Over the next 30-60 days, patient will demonstrate improved adherence to prescribed treatment plan for hypertension as evidenced by taking all medications as prescribed, monitoring and recording blood pressure as directed, adhering to low sodium/DASH diet . Over the next 30-60 days, patient will demonstrate improved health management independence as evidenced by checking blood pressure as directed and notifying PCP if SBP>180 or DBP > 100 taking all medications as prescribe, and adhering to a low sodium diet as discussed.  Interventions:  . Evaluation of current treatment plan related to hypertension self management and patient's adherence to plan as established by provider. . Assessed home blood pressure readings and reviewed blood pressure targets . Reviewed medications and assessed medication taking behavior . Advised patient, providing education and rationale, to monitor blood pressure daily and record, calling PCP for findings outside established parameters. Congratulated patient and his wife on BP self monitoring with achievement of targets . Inter-disciplinary care team  collaboration- referrals placed to clinic pharmacy technician Cheryle Horsfall for assistance with medication procurement on 10/5 and 10/20 . Discussed plans with patient for ongoing care management follow up and ensured patient/wife have direct contact information for care management team  Patient Self Care Activities:  . UNABLE to independently manage HTN . Self administers medications as prescribed . Attends all scheduled provider appointments . Calls provider office for new concerns, questions, or BP outside discussed parameters . Checks BP and records as discussed . Follows a low sodium diet/DASH diet  Please see past updates related to this goal by clicking on the "Past Updates" button in the selected goal            The care management team will reach out to the patient again over the next 30-60 days.   Kelli Churn RN, CCM, [facility] RN Care Manager [phone]

## 2020-04-11 NOTE — Patient Instructions (Signed)
Visit Information It was nice speaking with you today. Keep up the good work on checking and managing your blood pressure. Goals Addressed              This Visit's Progress     Patient Stated   .  "I'm not checking my blood pressure on a regular basis" (pt-stated)        Objective:  [name] BP Readings from Last 3 Encounters: .  01/11/20 . (!) 145/88 .  11/19/19 . (!) 143/88 .  10/08/19 . (!) 142/92    . Most recent eGFR/CrCl: No results found for: EGFR  No components found for: CRCL  Current Barriers:  [name] Knowledge Deficits related to basic understanding of hypertension pathophysiology and self care management- spoke with patient and his wife to complete follow up assessment, wife and patient and wife state the most recent self monitored blood pressure readings are meeting target with the last two readings reported as 98/64, and 108/72, patient reports good medication taking behavior  Case Manager Clinical Goal(s):  [name] Over the next 30-60 days, patient will demonstrate improved adherence to prescribed treatment plan for hypertension as evidenced by taking all medications as prescribed, monitoring and recording blood pressure as directed, adhering to low sodium/DASH diet . Over the next 30-60 days, patient will demonstrate improved health management independence as evidenced by checking blood pressure as directed and notifying PCP if SBP>180 or DBP > 100 taking all medications as prescribe, and adhering to a low sodium diet as discussed.  Interventions:  . Evaluation of current treatment plan related to hypertension self management and patient's adherence to plan as established by provider. . Assessed home blood pressure readings and reviewed blood pressure targets . Reviewed medications and assessed medication taking behavior . Advised patient, providing education and rationale, to monitor blood pressure daily and record, calling PCP for findings outside established parameters.  Congratulated patient and his wife on BP self monitoring with achievement of targets . Inter-disciplinary care team collaboration- referrals placed to clinic pharmacy technician Cheryle Horsfall for assistance with medication procurement on 10/5 and 10/20 . Discussed plans with patient for ongoing care management follow up and ensured patient/wife have direct contact information for care management team  Patient Self Care Activities:  . UNABLE to independently manage HTN . Self administers medications as prescribed . Attends all scheduled provider appointments . Calls provider office for new concerns, questions, or BP outside discussed parameters . Checks BP and records as discussed . Follows a low sodium diet/DASH diet  Please see past updates related to this goal by clicking on the "Past Updates" button in the selected goal          The patient verbalized understanding of instructions, educational materials, and care plan provided today and agreed to receive a mailed copy of patient instructions, educational materials, and care plan.   The care management team will reach out to the patient again over the next 30-60 days.   Kelli Churn RN, CCM, [facility] RN Care Manager [phone]

## 2020-04-13 NOTE — Progress Notes (Signed)
Internal Medicine Clinic Attending  CCM services provided by the care management provider and their documentation were discussed with Dr. Basaraba. We reviewed the pertinent findings, urgent action items addressed by the resident and non-urgent items to be addressed by the PCP.  I agree with the assessment, diagnosis, and plan of care documented in the CCM and resident's note.  Kalem Rockwell Thomas Vicktoria Muckey, MD 04/13/2020  

## 2020-04-13 NOTE — Progress Notes (Signed)
[facility] Medicine Clinic Resident  I have personally reviewed this encounter including the documentation in this note and/or discussed this patient with the care management provider. I will address any urgent items identified by the care management provider and will communicate my actions to the patient's PCP. I have reviewed the patient's CCM visit with my supervising attending, Dr Vincent.  Cason Luffman, MD 04/13/2020  

## 2020-04-15 ENCOUNTER — Ambulatory Visit: Payer: Self-pay

## 2020-04-15 DIAGNOSIS — I10 Essential (primary) hypertension: Secondary | ICD-10-CM

## 2020-04-15 NOTE — Chronic Care Management (AMB) (Signed)
  Care Management   Follow Up Note   04/15/2020 Name: Arthur [name] MRN: [id number] DOB: [birth year]  [name] is enrolled in a Managed Medicaid plan: No. Outreach attempt today was successful.    Referred by: Chevis Pretty, MD Reason for referral : Care Coordination (applying for disability and medicaid)   Arthur [name] is a 59 y.o. year old male who is a primary care patient of [name], Shawna Orleans, MD. The care management team was consulted for assistance with care management and care coordination needs.    Review of patient status, including review of consultants reports, relevant laboratory and other test results, and collaboration with appropriate care team members and the patient's provider was performed as part of comprehensive patient evaluation and provision of chronic care management services.    Goals Addressed              This Visit's Progress   .  "I want to apply for Medicaid" (pt-stated)        CARE PLAN ENTRY (see longitudinal plan of care for additional care plan information)  Current Barriers:  . Patient was denied for Social Security Disability Income and has continued difficulty with affording cost of medication. - 04/11/20- spoke with patient and wife via phone; they report they have not received a call about assistance with disability or Medicaid application  Clinical Social Work Clinical Goal(s):  Arthur [name] Over the next 90 days, patient will work with SW to address concerns related to financial constraints; applying for Medicaid  Interventions: . Inter-disciplinary care team collaboration (see longitudinal plan of care) . Message sent to Layla Barter with [facility] inquiring about assistance with Medicaid application; patient/spouse report they have not been contacted.  Arthur [name] Spoke to patient's wife to inform her that patient should be contacted by [facility] today for assistance with application  Patient Self Care  Activities:  . Self administers medications as prescribed . Attends all scheduled provider appointments . Calls provider office for new concerns or questions . Financial constraints  Please see past updates related to this goal by clicking on the "Past Updates" button in the selected goal          The care management team will reach out to the patient again over the next 30 days.      Arthur [name], BSW Embedded Care Coordination Social Worker [facility] [phone]

## 2020-04-15 NOTE — Progress Notes (Signed)
Internal Medicine Clinic Resident  I have personally reviewed this encounter including the documentation in this note and/or discussed this patient with the care management provider. I will address any urgent items identified by the care management provider and will communicate my actions to the patient's PCP. I have reviewed the patient's CCM visit with my supervising attending, Dr Hoffman.  Vasili Talayeh Bruinsma, MD 04/15/2020   

## 2020-04-15 NOTE — Patient Instructions (Signed)
Visit Information  Goals Addressed              This Visit's Progress   .  "I want to apply for Medicaid" (pt-stated)        CARE PLAN ENTRY (see longitudinal plan of care for additional care plan information)  Current Barriers:  . Patient was denied for Social Security Disability Income and has continued difficulty with affording cost of medication. - 04/11/20- spoke with patient and wife via phone; they report they have not received a call about assistance with disability or Medicaid application  Clinical Social Work Clinical Goal(s):  [name] Over the next 90 days, patient will work with SW to address concerns related to financial constraints; applying for Medicaid  Interventions: . Inter-disciplinary care team collaboration (see longitudinal plan of care) . Message sent to Layla Barter with [facility] inquiring about assistance with Medicaid application; patient/spouse report they have not been contacted.  [name] Spoke to patient's wife to inform her that patient should be contacted by [facility] today for assistance with application  Patient Self Care Activities:  . Self administers medications as prescribed . Attends all scheduled provider appointments . Calls provider office for new concerns or questions . Financial constraints  Please see past updates related to this goal by clicking on the "Past Updates" button in the selected goal         The patient verbalized understanding of instructions, educational materials, and care plan provided today and declined offer to receive copy of patient instructions, educational materials, and care plan.   The care management team will reach out to the patient again over the next 30 days.    [name], BSW Embedded Care Coordination Social Worker [facility] [phone]

## 2020-04-18 MED FILL — AMLODIPINE BESYLATE 5 MG TA: 5 | 30 days supply | Qty: 60 | Fill #1

## 2020-04-18 MED FILL — ?ATORVASTATIN 40MG TABLET: 40 | 30 days supply | Qty: 60 | Fill #1

## 2020-04-20 ENCOUNTER — Ambulatory Visit: Payer: Self-pay | Admitting: *Deleted

## 2020-04-20 ENCOUNTER — Other Ambulatory Visit: Payer: Self-pay

## 2020-04-20 ENCOUNTER — Other Ambulatory Visit: Payer: Self-pay | Admitting: Internal Medicine

## 2020-04-20 DIAGNOSIS — I10 Essential (primary) hypertension: Secondary | ICD-10-CM

## 2020-04-20 DIAGNOSIS — I639 Cerebral infarction, unspecified: Secondary | ICD-10-CM

## 2020-04-20 MED ORDER — LISINOPRIL 40 MG PO TABS: 40.0000 mg | ORAL_TABLET | Freq: Every day | ORAL | 0 refills | Status: DC

## 2020-04-20 MED FILL — LISINOPRIL 40 MG TABLET: 40 | 30 days supply | Qty: 30 | Fill #0

## 2020-04-20 NOTE — Telephone Encounter (Signed)
Need refill on lisinopril (ZESTRIL) 40 MG tablet(Expired)   [facility] [place]

## 2020-04-20 NOTE — Chronic Care Management (AMB) (Signed)
°  Care Management   Follow Up Note   04/20/2020 Name: Arthur [name] MRN: [id number] DOB: 26-Aug-1960  [name] is enrolled in a Managed Medicaid plan: No. Outreach attempt today was successful.    Referred by: Chevis Pretty, MD Reason for referral : Care Coordination (HTN. s/p stroke)   ULYS [name] is a 59 y.o. year old male who is a primary care patient of [name], Shawna Orleans, MD. The care management team was consulted for assistance with care management and care coordination needs.    Review of patient status, including review of consultants reports, relevant laboratory and other test results, and collaboration with appropriate care team members and the patient's provider was performed as part of comprehensive patient evaluation and provision of chronic care management services.    Goals Addressed              This Visit's Progress     Patient Stated     "I'm having trouble paying for my medications." (pt-stated)        CARE PLAN ENTRY (see longitudinal plan of care for additional care plan information)  Current Barriers:   Difficulty obtaining medications- returned call to patient's significant other ,[name], after she left a message while this CCM RN was son vacation on 11/22;  states patient needs refills on all of his medicines but they do not have any money for his medication copays and that her needs a refill on Lisinopril  Nurse Case Manager Clinical Goal(s):   Over the next 30-60 days, patient will work with CCM team and clinic pharmacy technician to address barriers related to medication procurement.   Interventions:   Inter-disciplinary care team collaboration (see longitudinal plan of care)  Sent urgent message to Red Team requesting Lisinopril refill today  Patient Self Care Activities:   Patient verbalizes understanding of plan to work with CCM team and clinic pharmacy technician to address medication procurement barriers  Self  administers medications as prescribed  Attends all scheduled provider appointments  Calls pharmacy for medication refills  Calls provider office for new concerns or questions  Unable to independently procure medications  Please see past updates related to this goal by clicking on the "Past Updates" button in the selected goal          The care management team will reach out to the patient again over the next 30-60 days.   Cranford Mon RN, CCM, [facility] CCM Clinic RN Care Manager [phone]

## 2020-04-23 ENCOUNTER — Other Ambulatory Visit: Payer: Self-pay | Admitting: Internal Medicine

## 2020-04-23 MED ORDER — LISINOPRIL 40 MG PO TABS: 40.0000 mg | ORAL_TABLET | Freq: Every day | ORAL | 0 refills | Status: DC

## 2020-04-23 NOTE — Addendum Note (Signed)
Addended by: Claudean Severance on: 04/23/2020 09:27 PM   Modules accepted: Orders

## 2020-04-23 NOTE — Progress Notes (Signed)
Internal Medicine Clinic Resident  I have personally reviewed this encounter including the documentation in this note and/or discussed this patient with the care management provider. I will address any urgent items identified by the care management provider and will communicate my actions to the patient's PCP. Refilled lisinopril prescription. I have reviewed the patient's CCM visit with my supervising attending, Dr Heide Spark.  Claudean Severance, MD 04/23/2020

## 2020-04-24 NOTE — Progress Notes (Signed)
Internal Medicine Clinic Attending  CCM services provided by the care management provider and their documentation were discussed with Dr. Gwyneth Revels. We reviewed the pertinent findings, urgent action items addressed by the resident and non-urgent items to be addressed by the PCP.  I agree with the assessment, diagnosis, and plan of care documented in the CCM and resident's note.  Earl Lagos, MD 04/24/2020

## 2020-05-11 ENCOUNTER — Ambulatory Visit: Payer: Self-pay | Admitting: *Deleted

## 2020-05-11 DIAGNOSIS — I639 Cerebral infarction, unspecified: Secondary | ICD-10-CM

## 2020-05-11 DIAGNOSIS — I10 Essential (primary) hypertension: Secondary | ICD-10-CM

## 2020-05-11 NOTE — Chronic Care Management (AMB) (Signed)
Care Management   Follow Up Note   05/11/2020 Name: Arthur [name] MRN: [id number] DOB: 25-Nov-1960  Arthur [name] is enrolled in a Managed Medicaid plan: No. Outreach attempt today was successful.    Referred by: Dewayne Hatch, MD Reason for referral : Care Coordination (HTN. s/p stroke)   Arthur [name] is a 59 y.o. year old male who is a primary care patient of [name], [name], MD. The care management team was consulted for assistance with care management and care coordination needs.    Review of patient status, including review of consultants reports, relevant laboratory and other test results, and collaboration with appropriate care team members and the patient's provider was performed as part of comprehensive patient evaluation and provision of chronic care management services.    Goals Addressed              This Visit's Progress     Patient Stated   .  "I'm not checking my blood pressure on a regular basis" (pt-stated)        Objective:  [name] BP Readings from Last 3 Encounters: .  01/11/20 . (!) 145/88 .  11/19/19 . (!) 143/88 .  10/08/19 . (!) 142/92    . Most recent eGFR/CrCl: No results found for: EGFR  No components found for: CRCL  Current Barriers:  [name] Knowledge Deficits related to basic understanding of hypertension pathophysiology and self care management- spoke with patient's wife Arthur [name],  as patient was not available, to complete follow up assessment, Arthur [name] states she continues to check and record his blood pressure and it is meeting treatment targets, she says they have not heard from [facility] about whether patient qualified for Medicaid a decision, Arthur [name] reports patient has his medications and is taking them as prescribed   Case Manager Clinical Goal(s):  [name] Over the next 30-60 days, patient will demonstrate improved adherence to prescribed treatment plan for hypertension as evidenced by taking all medications as prescribed, monitoring and recording  blood pressure as directed, adhering to low sodium/DASH diet . Over the next 30-60 days, patient will demonstrate improved health management independence as evidenced by checking blood pressure as directed and notifying PCP if SBP>180 or DBP > 100 taking all medications as prescribe, and adhering to a low sodium diet as discussed.  Interventions:  . Evaluation of current treatment plan related to hypertension self management and patient's adherence to plan as established by provider. . Assessed home blood pressure readings and reviewed blood pressure targets . Reviewed medications and assessed medication taking behavior . Advised patient, providing education and rationale, to monitor blood pressure daily and record, calling PCP for findings outside established parameters. Congratulated patient and his wife on BP self monitoring with achievement of targets . Inter-disciplinary care team collaboration- advised Arthur [name] that [place] is scheduled to call patient on 12/17 for Medicaid application follow up . Encouraged Arthur [name] to contact Cheryle Horsfall, clinic pharmacy technician, if patient request financial assistance with his medication co pays as Claiborne Billings can access the clinic petty cash fund if needed ($25 per clinic patient per year)  . Discussed plans with patient for ongoing care management follow up and ensured patient/wife have direct contact information for care management team  Patient Self Care Activities:  . UNABLE to independently manage HTN . Self administers medications as prescribed . Attends all scheduled provider appointments . Calls provider office for new concerns, questions, or BP outside discussed parameters . Checks BP and records as discussed .  Follows a low sodium diet/DASH diet  Please see past updates related to this goal by clicking on the "Past Updates" button in the selected goal           The care management team will reach out to the patient again over the  next 30-60 days.   Kelli Churn RN, CCM, [facility] RN Care Manager [phone]

## 2020-05-11 NOTE — Patient Instructions (Signed)
Visit Information  Goals Addressed              This Visit's Progress     Patient Stated   .  "I'm not checking my blood pressure on a regular basis" (pt-stated)        Objective:  [name] BP Readings from Last 3 Encounters: .  01/11/20 . (!) 145/88 .  11/19/19 . (!) 143/88 .  10/08/19 . (!) 142/92    . Most recent eGFR/CrCl: No results found for: EGFR  No components found for: CRCL  Current Barriers:  [name] Knowledge Deficits related to basic understanding of hypertension pathophysiology and self care management- spoke with patient's wife [name],  as patient was not available, to complete follow up assessment, [name] states she continues to check and record his blood pressure and it is meeting treatment targets, she says they have not heard from [facility] about whether patient qualified for Medicaid a decision, [name] reports patient has his medications and is taking them as prescribed   Case Manager Clinical Goal(s):  [name] Over the next 30-60 days, patient will demonstrate improved adherence to prescribed treatment plan for hypertension as evidenced by taking all medications as prescribed, monitoring and recording blood pressure as directed, adhering to low sodium/DASH diet . Over the next 30-60 days, patient will demonstrate improved health management independence as evidenced by checking blood pressure as directed and notifying PCP if SBP>180 or DBP > 100 taking all medications as prescribe, and adhering to a low sodium diet as discussed.  Interventions:  . Evaluation of current treatment plan related to hypertension self management and patient's adherence to plan as established by provider. . Assessed home blood pressure readings and reviewed blood pressure targets . Reviewed medications and assessed medication taking behavior . Advised patient, providing education and rationale, to monitor blood pressure daily and record, calling PCP for findings outside established parameters. Congratulated  patient and his wife on BP self monitoring with achievement of targets . Inter-disciplinary care team collaboration- advised [name] that [place] is scheduled to call patient on 12/17 for Medicaid application follow up . Encouraged [name] to contact Cheryle Horsfall, clinic pharmacy technician, if patient request financial assistance with his medication co pays as [employer]ib[employer] Billings can access the clinic petty cash fund if needed ($25 per clinic patient per year)  . Discussed plans with patient for ongoing care management follow up and ensured patient/wife have direct contact information for care management team  Patient Self Care Activities:  . UNABLE to independently manage HTN . Self administers medications as prescribed . Attends all scheduled provider appointments . Calls provider office for new concerns, questions, or BP outside discussed parameters . Checks BP and records as discussed . Follows a low sodium diet/DASH diet  Please see past updates related to this goal by clicking on the "Past Updates" button in the selected goal          The patient's wife  verbalized understanding of instructions, educational materials, and care plan provided today and declined offer to receive copy of patient instructions, educational materials, and care plan.   The care management team will reach out to the patient again over the next 30-60 days.   Kelli Churn RN, CCM, [facility] RN Care Manager [phone]

## 2020-05-11 NOTE — Progress Notes (Signed)
Internal Medicine Clinic Resident  I have personally reviewed this encounter including the documentation in this note and/or discussed this patient with the care management provider. I will address any urgent items identified by the care management provider and will communicate my actions to the patient's PCP. I have reviewed the patient's CCM visit with my supervising attending, Dr Sandre Kitty.  [name], MD Internal Medicine Resident PGY-2 [name] Pager: [phone] 05/11/2020 5:04 PM

## 2020-05-12 NOTE — Progress Notes (Signed)
Internal Medicine Clinic Attending  CCM services provided by the care management provider and their documentation were reviewed with Dr. Christian.  We reviewed the pertinent findings, urgent action items addressed by the resident and non-urgent items to be addressed by the PCP.  I agree with the assessment, diagnosis, and plan of care documented in the CCM and resident's note.  Soni Kegel N Vernita Tague, MD 05/12/2020  

## 2020-05-13 ENCOUNTER — Ambulatory Visit: Payer: Self-pay

## 2020-05-13 ENCOUNTER — Telehealth: Payer: Self-pay

## 2020-05-13 DIAGNOSIS — I639 Cerebral infarction, unspecified: Secondary | ICD-10-CM

## 2020-05-13 DIAGNOSIS — I10 Essential (primary) hypertension: Secondary | ICD-10-CM

## 2020-05-13 NOTE — Telephone Encounter (Signed)
  Chronic Care Management   Outreach Note  05/13/2020 Name: PAULMICHAEL SCHRECK MRN: [id number] DOB: [birth year]  Referred by: Chevis Pretty, MD Reason for referral : Care Coordination   ZALAN SHIDLER is enrolled in a Managed Medicaid Health Plan: No  An unsuccessful telephone outreach was attempted today. The patient was referred to the case management team for assistance with care management and care coordination.   Follow Up Plan: A HIPAA compliant phone message was left for the patient providing contact information and requesting a return call.    [name], BSW Embedded Care Coordination Social Worker [facility] [phone]

## 2020-05-13 NOTE — Patient Instructions (Signed)
Visit Information  Goals Addressed              This Visit's Progress   .  COMPLETED: "I want to apply for Medicaid" (pt-stated)        CARE PLAN ENTRY (see longitudinal plan of care for additional care plan information)  Current Barriers:  . Patient was denied for Social Security Disability Income and has continued difficulty with affording cost of medication. - 04/11/20- spoke with patient and wife via phone; they report they have not received a call about assistance with disability or Medicaid application  Clinical Social Work Clinical Goal(s):  [name] Over the next 90 days, patient will work with SW to address concerns related to financial constraints; applying for Medicaid  Interventions: . Inter-disciplinary care team collaboration (see longitudinal plan of care) . Spoke to patient's spouse who confirmed that Medicaid application was completed/submitted . Informed spouse that application can take up to 90 days to process . Encouraged spouse to contact [facility] DSS to inquire about status of application . Encouraged spouse to monitor mail closely in case documentation is sent to them from DSS  .   Patient Self Care Activities:  . Self administers medications as prescribed . Attends all scheduled provider appointments . Calls provider office for new concerns or questions . Financial constraints  Please see past updates related to this goal by clicking on the "Past Updates" button in the selected goal         The patient's spouse verbalized understanding of instructions, educational materials, and care plan provided today and declined offer to receive copy of patient instructions, educational materials, and care plan.     No further SW follow up  at this time.  Continued outreach by [facility].      [name], BSW Embedded Care Coordination Social Worker [facility] [phone]

## 2020-05-13 NOTE — Chronic Care Management (AMB) (Signed)
  Care Management   Follow Up Note   05/13/2020 Name: Arthur [name] MRN: [id number] DOB: April 06, 1961  Arthur [name] is enrolled in a Managed Medicaid plan: No. Outreach attempt today was successful.    Referred by: Arthur Pretty, MD Reason for referral : Care Coordination   Arthur [name] is a 59 y.o. year old male who is a primary care patient of [name], Shawna Orleans, MD. The care management team was consulted for assistance with care management and care coordination needs.    Review of patient status, including review of consultants reports, relevant laboratory and other test results, and collaboration with appropriate care team members and the patient's provider was performed as part of comprehensive patient evaluation and provision of chronic care management services.    Goals Addressed              This Visit's Progress   .  COMPLETED: "I want to apply for Medicaid" (pt-stated)        CARE PLAN ENTRY (see longitudinal plan of care for additional care plan information)  Current Barriers:  . Patient was denied for Social Security Disability Income and has continued difficulty with affording cost of medication. - 04/11/20- spoke with patient and wife via phone; they report they have not received a call about assistance with disability or Medicaid application  Clinical Social Work Clinical Goal(s):  [name] Over the next 90 days, patient will work with SW to address concerns related to financial constraints; applying for Medicaid  Interventions: . Inter-disciplinary care team collaboration (see longitudinal plan of care) . Spoke to patient's spouse who confirmed that Medicaid application was completed/submitted . Informed spouse that application can take up to 90 days to process . Encouraged spouse to contact [facility] DSS to inquire about status of application . Encouraged spouse to monitor mail closely in case documentation is sent to them from DSS  .   Patient Self  Care Activities:  . Self administers medications as prescribed . Attends all scheduled provider appointments . Calls provider office for new concerns or questions . Financial constraints  Please see past updates related to this goal by clicking on the "Past Updates" button in the selected goal          No further SW follow up  at this time.  Continued outreach by [facility]      Arthur [name], BSW Embedded Care Coordination Social Worker [facility] [phone]

## 2020-05-13 NOTE — Progress Notes (Signed)
Internal Medicine Clinic Resident  I have personally reviewed this encounter including the documentation in this note and/or discussed this patient with the care management provider. I will address any urgent items identified by the care management provider and will communicate my actions to the patient's PCP. I have reviewed the patient's CCM visit with my supervising attending, Dr Raines.  Aicia Babinski, MD 05/13/2020   

## 2020-05-16 NOTE — Progress Notes (Signed)
Internal Medicine Clinic Attending  CCM services provided by the care management provider and their documentation were reviewed with Dr. Watson.  We reviewed the pertinent findings, urgent action items addressed by the resident and non-urgent items to be addressed by the PCP.  I agree with the assessment, diagnosis, and plan of care documented in the CCM and resident's note.  Alexander N Raines, MD 05/16/2020  

## 2020-05-24 ENCOUNTER — Ambulatory Visit: Payer: Self-pay | Admitting: *Deleted

## 2020-05-24 DIAGNOSIS — I10 Essential (primary) hypertension: Secondary | ICD-10-CM

## 2020-05-24 DIAGNOSIS — I639 Cerebral infarction, unspecified: Secondary | ICD-10-CM

## 2020-05-24 MED FILL — ?ATORVASTATIN 40MG TABLET: 40 | 30 days supply | Qty: 60 | Fill #2

## 2020-05-24 MED FILL — LISINOPRIL 40 MG TABLET: 40 | 30 days supply | Qty: 30 | Fill #1

## 2020-05-24 MED FILL — AMLODIPINE BESYLATE 5 MG TA: 5 | 30 days supply | Qty: 60 | Fill #2

## 2020-05-24 NOTE — Progress Notes (Signed)
Internal Medicine Clinic Resident  I have personally reviewed this encounter including the documentation in this note and/or discussed this patient with the care management provider. I will address any urgent items identified by the care management provider and will communicate my actions to the patient's PCP. I have reviewed the patient's CCM visit with my supervising attending, Dr Mullen.  Aubria Vanecek K Dominga Mcduffie, MD 05/24/2020    

## 2020-05-24 NOTE — Progress Notes (Signed)
Internal Medicine Clinic Resident  I have personally reviewed this encounter including the documentation in this note and/or discussed this patient with the care management provider. I will address any urgent items identified by the care management provider and will communicate my actions to the patient's PCP. I have reviewed the patient's CCM visit with my supervising attending, Dr Mullen.  Porchea Charrier K Lewanna Petrak, MD 05/24/2020    

## 2020-05-24 NOTE — Chronic Care Management (AMB) (Signed)
  Care Management   Note  05/24/2020 Name: Arthur [name] MRN: [id number] DOB: [birth year]  [name] is enrolled in a Managed Medicaid plan: No. Outreach attempt today was successful .    Patient' significant other left a second message for this CCM RN regarding needs for assistance with medication copay.  Called Ms Bradly Bienenstock, she says patient is completely out of all of his medicines and needs help with copay ASAP. Advised her that clinic pharmacy staff has been messaged to assist with any available resources and that someone should be reaching out to her.  The care management team will reach out to the patient again over the next 30-60 days.   Cranford Mon RN, CCM, [facility] CCM Clinic RN Care Manager [phone]

## 2020-05-24 NOTE — Chronic Care Management (AMB) (Signed)
  Care Management   Follow Up Note   05/24/2020 Name: Arthur [name] MRN: [id number] DOB: [birth year]  [name] is enrolled in a Managed Medicaid plan: No. Outreach attempt today was successful.    Referred by: Dewayne Hatch, MD Reason for referral : Care Coordination (HTN. s/p stroke)   Arthur [name] is a 59 y.o. year old male who is a primary care patient of [name], [name], MD. The care management team was consulted for assistance with care management and care coordination needs.    Review of patient status, including review of consultants reports, relevant laboratory and other test results, and collaboration with appropriate care team members and the patient's provider was performed as part of comprehensive patient evaluation and provision of chronic care management services.    Patient Care Plan: Hypertension (Adult)    Problem Identified: Disease Progression (Hypertension)   Priority: High  Onset Date: 05/24/2020    Goal: Disease Progression Prevented or Minimized   Start Date: 05/24/2020  This Visit's Progress: On track  Priority: High  Note:   Objective:  . Last practice recorded BP readings:  BP Readings from Last 3 Encounters:  01/11/20 (!) 145/88  11/19/19 (!) 143/88  10/08/19 (!) 142/92 .   [name] Most recent eGFR/CrCl: No results found for: EGFR  No components found for: CRCL Current Barriers:  . Difficulty obtaining medications . Unable to afford medication copay Case Manager Clinical Goal(s):  [name] Over the next 30-60 days, patient will verbalize understanding of plan for hypertension management Interventions:  . Evaluation of current treatment plan related to hypertension self management and patient's adherence to plan as established by provider. . Provided education to patient re: stroke prevention, s/s of heart attack and stroke, DASH diet, complications of uncontrolled blood pressure . Reviewed medications with patient and discussed importance of  compliance . Referred patient to clinic pharmacist for ongoing medication copay assistance . Discussed plans with patient for ongoing care management follow up and provided patient with direct contact information for care management team . Advised patient, providing education and rationale, to monitor blood pressure daily and record, calling PCP for findings outside established parameters.  . Reviewed scheduled/upcoming provider appointments including: None at present- cannot afford clinic visit copay Patient Goals/Self-Care Activities . Over the next 30-60 days, patient will:  - Self administers medications as prescribed Attends all scheduled provider appointments Calls provider office for new concerns, questions, or BP outside discussed parameters Checks BP and records as discussed Follows a low sodium diet/DASH diet Follow Up Plan: The care management team will reach out to the patient again over the next 30-60 days.         The care management team will reach out to the patient again over the next 30-60 days.   Kelli Churn RN, CCM, [facility] RN Care Manager [phone]

## 2020-05-25 ENCOUNTER — Telehealth: Payer: Self-pay | Admitting: Pharmacist

## 2020-05-25 NOTE — Telephone Encounter (Signed)
-----   Message from Bary Richard, RN sent at 05/24/2020  2:22 PM EST ----- Regarding: FW: Medication copay assistance Mrs Mier called me again just now and said patient is completely out of his medications and she was told it may take up to 90 days before she hear's from [facility] and that is also what Amber told me. Any assistance you can provide this pt ASAP will be appreciated. I told Ms Fehringer someone from the pharmacy will reach out to her. Thanks, Marylu Lund  ----- Message ----- From: Bary Richard, RN Sent: 05/24/2020   9:37 AM EST To: Weldon Picking, CPhT, Cheral Almas, [name] Subject: FW: Medication copay assistance                Update, just read Amber's note of 12/17 and pt was denied disability but Medicaid decision is still pending. Marylu Lund ----- Message ----- From: Bary Richard, RN Sent: 05/24/2020   9:29 AM EST To: Weldon Picking, CPhT, Cheral Almas, [facility] Subject: Medication copay assistance                    Hi Boykin Reaper and Tresa Endo,  I hope both of you had a great Christmas Holiday.  I am at a loss as to how to help this patient. Every time it is time to refill his medications at the [facility] and [facility], his wife [name], calls me and says they do not have the copay money. Tresa Endo has helped to reduce his copays to the minimum amount but they still say they cannot afford it.  I know Amber has assisted the patient with applying for Medicaid and disability but according to his wife, they are still waiting on a decision from both. There is a $25 petty cash fund that any clinic patient can use once a year that Mr Hinojos has not utilized but I am not in the clinic today and there is a form that must be signed by the pt's provider. [name] has left a message with me today asking for assistance. Are one of you able to help with this issue? Thanks, Marylu Lund

## 2020-05-25 NOTE — Telephone Encounter (Signed)
I called and spoke with patient's wife regarding below message I received from Cranford Mon, RN.    Discussed with patient's wife that there is unfortunately no way for [place] to provide patient's medications at no cost, unless they are on the [facility] list. Currently one of his medications is, therefore that med would be provided at no cost, but his other two chronic medications would be 4$ each for a 30 day supply at the [facility] and [facility] If patient is approved on Medicaid he will still have a $3 copay for medications. [name] verbalized understanding.

## 2020-06-10 ENCOUNTER — Ambulatory Visit: Payer: Self-pay | Admitting: *Deleted

## 2020-06-10 DIAGNOSIS — I10 Essential (primary) hypertension: Secondary | ICD-10-CM

## 2020-06-10 DIAGNOSIS — I639 Cerebral infarction, unspecified: Secondary | ICD-10-CM

## 2020-06-10 NOTE — Chronic Care Management (AMB) (Signed)
  Care Management   Note  06/10/2020 Name: Arthur [name] MRN: [id number] DOB: 09/15/1960  [name] is enrolled in a Managed Medicaid plan: No. Outreach attempt today was successful   Reached patient's significant other [name] via phone. Advised her there is a $9 credit on patient's account at the [employer] and [employer] for this month's medications copay.  This CCM RN accessed the [facility] on 05/26/20 and hand delivered money to the pharmacy on 05/26/20.  [name] had picked up patient's medications earlier and borrowed the money to pay for the medications so the money from the petty cash fund was credited to patient's account.  [name] states patient's home blood pressure  Readings are meeting treatment targets.   The care management team will reach out to the patient again over the next 30 days.   Cranford Mon RN, CCM, [facility] Clinic RN Care Manager [phone]

## 2020-06-11 NOTE — Progress Notes (Signed)
Internal Medicine Clinic Resident  I have personally reviewed this encounter including the documentation in this note and/or discussed this patient with the care management provider. I will address any urgent items identified by the care management provider and will communicate my actions to the patient's PCP. I have reviewed the patient's CCM visit with my supervising attending, Dr Mayford Knife.  Eliezer Bottom, MD  [facility] PGY-2 06/11/2020

## 2020-06-14 NOTE — Progress Notes (Signed)
Documentation reviewed; no action needed.

## 2020-06-21 MED FILL — ?ATORVASTATIN 40MG TABLET: 40 | 30 days supply | Qty: 60 | Fill #3

## 2020-06-21 MED FILL — LISINOPRIL 40 MG TABLET: 40 | 30 days supply | Qty: 30 | Fill #2

## 2020-06-21 MED FILL — AMLODIPINE BESYLATE 5 MG TA: 5 | 30 days supply | Qty: 60 | Fill #3

## 2020-07-11 ENCOUNTER — Ambulatory Visit: Payer: Self-pay | Admitting: *Deleted

## 2020-07-11 DIAGNOSIS — I639 Cerebral infarction, unspecified: Secondary | ICD-10-CM

## 2020-07-11 DIAGNOSIS — I10 Essential (primary) hypertension: Secondary | ICD-10-CM

## 2020-07-11 NOTE — Chronic Care Management (AMB) (Signed)
Care Management    RN Visit Note  07/11/2020 Name: Arthur [name] MRN: [id number] DOB: June 24, 1960  Subjective: Arthur [name] is a 60 y.o. year old male who is a primary care patient of [name], [name], MD. The care management team was consulted for assistance with disease management and care coordination needs.    Engaged with patient by telephone for follow up visit in response to provider referral for case management and/or care coordination services.   Consent to Services:   Arthur [name] was given information about Care Management services today including:  1. Care Management services includes personalized support from designated clinical staff supervised by his physician, including individualized plan of care and coordination with other care providers 2. 24/7 contact phone numbers for assistance for urgent and routine care needs. 3. The patient may stop case management services at any time by phone call to the office staff.  Patient agreed to services and consent obtained.   Assessment: Review of patient past medical history, allergies, medications, health status, including review of consultants reports, laboratory and other test data, was performed as part of comprehensive evaluation and provision of chronic care management services.   SDOH (Social Determinants of Health) assessments and interventions performed:    Care Plan  Allergies  Allergen Reactions  . Penicillins Hives    Outpatient Encounter Medications as of 07/11/2020  Medication Sig  . amLODipine (NORVASC) 10 MG tablet Take 1 tablet (10 mg total) by mouth daily.  [name] atorvastatin (LIPITOR) 80 MG tablet Take 1 tablet (80 mg total) by mouth daily.  . folic acid (FOLVITE) 1 MG tablet Take 1 tablet (1 mg total) by mouth daily. (Patient not taking: Reported on 04/11/2020)  . lisinopril (ZESTRIL) 40 MG tablet Take 1 tablet (40 mg total) by mouth daily.  [name] thiamine 100 MG tablet Take 1 tablet (100 mg total) by mouth daily.    No facility-administered encounter medications on file as of 07/11/2020.    Patient Active Problem List   Diagnosis Date Noted  . Essential hypertension 10/08/2019  . Cerebrovascular accident (CVA) ([place]) 10/02/2019    Conditions to be addressed/monitored: HTN,  s/p stroke  Care Plan : Hypertension (Adult)  Updates made by [name], RN since 07/11/2020 12:00 AM    Problem: Disease Progression (Hypertension)   Priority: High  Onset Date: 05/24/2020    Goal: Disease Progression Prevented or Minimized   Start Date: 05/24/2020  Expected End Date: 08/25/2020  Recent Progress: On track  Priority: High  Note:   Objective:  . Last practice recorded BP readings:  BP Readings from Last 3 Encounters:  01/11/20 (!) 145/88  11/19/19 (!) 143/88  10/08/19 (!) 142/92 .   [name] Most recent eGFR/CrCl: No results found for: EGFR  No components found for: CRCL Current Barriers:  . Difficulty obtaining medications- spoke with patient's wife Arthur [name], she says the last 2 BP readings she has taken have been low, from memory she says she thinks the top number was 79 and the bottom number in the 50's, she also says patient's appetite has waned but she doesn't think he has lost any weight, she says patient is not complaining of feeling unwell or unusually tired . Unable to afford medication copay Case Manager Clinical Goal(s):  [name] Over the next 30-60 days, patient will verbalize understanding of plan for hypertension management Interventions:  . Evaluation of current treatment plan related to hypertension self management and patient's adherence to plan as established by provider. [name]  Instructed patient's wife to take patient's blood pressure today and tomorrow , write down both the BP and pulse and call this CCM RN on 07/12/20 with the readings and to have patient available for telephone assessment . Reviewed the blood pressure treatment targets and explained why it is important to record pulse if BP  readings are low . Instructed patient's wife if BP readings are low today and tomorrow, he will need urgent clinic appointment to validate readings and adjust treatment plan prn . Discussed plans with patient for ongoing care management follow up and provided patient with direct contact information for care management team . Advised patient, providing education and rationale, to monitor blood pressure daily and record, calling PCP for findings outside established parameters.  . Reviewed scheduled/upcoming provider appointments including: None at present- cannot afford clinic visit copay Patient Goals/Self-Care Activities . Over the next 30-60 days, patient will:  - Self administers medications as prescribed Attends all scheduled provider appointments Calls provider office for new concerns, questions, or BP outside discussed parameters Checks BP and records as discussed Follows a low sodium diet/DASH diet Follow Up Plan: The care management team will reach out to the patient again over the next 30-60 days.       Plan: The care management team will reach out to the patient again over the next 1 day.  Kelli Churn RN, CCM, [facility] RN Care Manager [phone]

## 2020-07-12 ENCOUNTER — Ambulatory Visit: Payer: Self-pay | Admitting: *Deleted

## 2020-07-12 DIAGNOSIS — I639 Cerebral infarction, unspecified: Secondary | ICD-10-CM

## 2020-07-12 DIAGNOSIS — I10 Essential (primary) hypertension: Secondary | ICD-10-CM

## 2020-07-12 NOTE — Progress Notes (Signed)
Internal Medicine Clinic Resident  I have personally reviewed this encounter including the documentation in this note and/or discussed this patient with the care management provider. I will address any urgent items identified by the care management provider and will communicate my actions to the patient's PCP. I have reviewed the patient's CCM visit with my supervising attending, Dr Williams.  Letroy Vazguez K Niasia Lanphear, MD 07/12/2020   

## 2020-07-12 NOTE — Chronic Care Management (AMB) (Signed)
Care Management    RN Visit Note  07/12/2020 Name: Arthur [name] MRN: [id number] DOB: 1961-05-26  Subjective: Arthur [name] is a 60 y.o. year old male who is a primary care patient of [name], [name], MD. The care management team was consulted for assistance with disease management and care coordination needs.    Engaged with patient's wife by telephone for follow up visit in response to provider referral for case management and/or care coordination services.   Consent to Services:   [name] was given information about Care Management services today including:  1. Care Management services includes personalized support from designated clinical staff supervised by his physician, including individualized plan of care and coordination with other care providers 2. 24/7 contact phone numbers for assistance for urgent and routine care needs. 3. The patient may stop case management services at any time by phone call to the office staff.  Patient agreed to services and consent obtained.   Assessment: Review of patient past medical history, allergies, medications, health status, including review of consultants reports, laboratory and other test data, was performed as part of comprehensive evaluation and provision of chronic care management services.   SDOH (Social Determinants of Health) assessments and interventions performed:    Care Plan  Allergies  Allergen Reactions  . Penicillins Hives    Outpatient Encounter Medications as of 07/12/2020  Medication Sig  . amLODipine (NORVASC) 10 MG tablet Take 1 tablet (10 mg total) by mouth daily.  [name] atorvastatin (LIPITOR) 80 MG tablet Take 1 tablet (80 mg total) by mouth daily.  . folic acid (FOLVITE) 1 MG tablet Take 1 tablet (1 mg total) by mouth daily. (Patient not taking: Reported on 04/11/2020)  . lisinopril (ZESTRIL) 40 MG tablet Take 1 tablet (40 mg total) by mouth daily.  [name] thiamine 100 MG tablet Take 1 tablet (100 mg total) by mouth  daily.   No facility-administered encounter medications on file as of 07/12/2020.    Patient Active Problem List   Diagnosis Date Noted  . Essential hypertension 10/08/2019  . Cerebrovascular accident (CVA) ([place]) 10/02/2019    Conditions to be addressed/monitored: HTN,  s/p stroke  Care Plan : Hypertension (Adult)  Updates made by [name], RN since 07/12/2020 12:00 AM    Problem: Disease Progression (Hypertension)   Priority: High  Onset Date: 05/24/2020    Goal: Disease Progression Prevented or Minimized   Start Date: 05/24/2020  Expected End Date: 08/25/2020  Recent Progress: On track  Priority: High  Note:   Objective:  . Last practice recorded BP readings:  BP Readings from Last 3 Encounters:  01/11/20 (!) 145/88  11/19/19 (!) 143/88  10/08/19 (!) 142/92 .   [name] Most recent eGFR/CrCl: No results found for: EGFR  No components found for: CRCL Current Barriers:  . Difficulty obtaining medications- spoke with patient's wife [name], she says she took patient's blood pressure last night and this morning and readings were 140-166/78-90 and pulse 84 and 80 . Unable to afford medication copay Case Manager Clinical Goal(s):  [name] Over the next 30-60 days, patient will verbalize understanding of plan for hypertension management Interventions:  . Evaluation of current treatment plan related to hypertension self management and patient's adherence to plan as established by provider. . Instructed patient's wife to take patient's blood pressure daily and call clinic for consistent readings > 140/90 . Reviewed the blood pressure treatment targets and explained why it is important to record pulse if BP readings are  low . Discussed nutritional consult with Arthur [name] if he qualifies for Medicaid . Requested patient's wife keep this CCM RN updated on status of Medicaid decision . Discussed plans with patient for ongoing care management follow up and provided patient with direct  contact information for care management team . Advised patient, providing education and rationale, to monitor blood pressure daily and record, calling PCP for findings outside established parameters.  . Reviewed scheduled/upcoming provider appointments including: None at present- cannot afford clinic visit copay Patient Goals/Self-Care Activities . Over the next 30-60 days, patient will:  - Self administers medications as prescribed Attends all scheduled provider appointments Calls provider office for new concerns, questions, or BP outside discussed parameters Checks BP and records as discussed Follows a low sodium diet/DASH diet Follow Up Plan: The care management team will reach out to the patient again over the next 30-60 days.       Kelli Churn RN, CCM, [facility] RN Care Manager [phone]

## 2020-07-12 NOTE — Progress Notes (Signed)
Internal Medicine Clinic Resident  I have personally reviewed this encounter including the documentation in this note and/or discussed this patient with the care management provider. I will address any urgent items identified by the care management provider and will communicate my actions to the patient's PCP. I have reviewed the patient's CCM visit with my supervising attending, Dr Criselda Peaches.  Per her note, Ms. Alben Deeds will check in with patient today to assess BP/pulse. If needed, will schedule patient for clinic visit.   Thalia Bloodgood, MD 07/12/2020

## 2020-07-12 NOTE — Patient Instructions (Signed)
Visit Information It was nice speaking with you today. Patient Care Plan: Hypertension (Adult)    Problem Identified: Disease Progression (Hypertension)   Priority: High  Onset Date: 05/24/2020    Goal: Disease Progression Prevented or Minimized   Start Date: 05/24/2020  Expected End Date: 08/25/2020  Recent Progress: On track  Priority: High  Note:   Objective:  . Last practice recorded BP readings:  BP Readings from Last 3 Encounters:  01/11/20 (!) 145/88  11/19/19 (!) 143/88  10/08/19 (!) 142/92 .   [name] Most recent eGFR/CrCl: No results found for: EGFR  No components found for: CRCL Current Barriers:  . Difficulty obtaining medications- spoke with patient's wife [name], she says she took patient's blood pressure last night and this morning and readings were 140-166/78-90 and pulse 84 and 80 . Unable to afford medication copay Case Manager Clinical Goal(s):  [name] Over the next 30-60 days, patient will verbalize understanding of plan for hypertension management Interventions:  . Evaluation of current treatment plan related to hypertension self management and patient's adherence to plan as established by provider. . Instructed patient's wife to take patient's blood pressure daily and call clinic for consistent readings > 140/90 . Reviewed the blood pressure treatment targets and explained why it is important to record pulse if BP readings are low . Discussed nutritional consult with Debera Lat if he qualifies for Medicaid . Requested patient's wife keep this CCM Rn updated on status of Medicaid decision . Discussed plans with patient for ongoing care management follow up and provided patient with direct contact information for care management team . Advised patient, providing education and rationale, to monitor blood pressure daily and record, calling PCP for findings outside established parameters.  . Reviewed scheduled/upcoming provider appointments including: None at present- cannot  afford clinic visit copay Patient Goals/Self-Care Activities . Over the next 30-60 days, patient will:  - Self administers medications as prescribed Attends all scheduled provider appointments Calls provider office for new concerns, questions, or BP outside discussed parameters Checks BP and records as discussed Follows a low sodium diet/DASH diet Follow Up Plan: The care management team will reach out to the patient again over the next 30-60 days.        The patient verbalized understanding of instructions, educational materials, and care plan provided today and declined offer to receive copy of patient instructions, educational materials, and care plan.     Kelli Churn RN, CCM, [facility] RN Care Manager [phone]

## 2020-07-13 NOTE — Progress Notes (Signed)
THis CCM encounter was reviewed and approved with Dr. Agyei. Grateful to our CCM colleague, Janet Hauser RN. 

## 2020-07-13 NOTE — Progress Notes (Signed)
Internal Medicine Clinic Attending  CCM services provided by the care management provider and their documentation were discussed with Dr. Katsadouros. We reviewed the pertinent findings, urgent action items addressed by the resident and non-urgent items to be addressed by the PCP.  I agree with the assessment, diagnosis, and plan of care documented in the CCM and resident's note.  Aryan Sparks, MD 07/13/2020  

## 2020-08-04 MED FILL — ?ATORVASTATIN 40MG TABLET: 40 | 30 days supply | Qty: 60 | Fill #4

## 2020-08-04 MED FILL — AMLODIPINE BESYLATE 5 MG TA: 5 | 30 days supply | Qty: 60 | Fill #4

## 2020-08-04 MED FILL — LISINOPRIL 40 MG TAB: 40 | 30 days supply | Qty: 30 | Fill #0

## 2020-08-09 ENCOUNTER — Ambulatory Visit: Payer: Self-pay | Admitting: *Deleted

## 2020-08-09 DIAGNOSIS — I639 Cerebral infarction, unspecified: Secondary | ICD-10-CM

## 2020-08-09 DIAGNOSIS — I10 Essential (primary) hypertension: Secondary | ICD-10-CM

## 2020-08-09 NOTE — Patient Instructions (Signed)
Visit Information It was nice speaking with you today. Patient Care Plan: Hypertension (Adult)    Problem Identified: Disease Progression (Hypertension)   Priority: High  Onset Date: 05/24/2020    Goal: Disease Progression Prevented or Minimized   Start Date: 05/24/2020  Expected End Date: 08/25/2020  Recent Progress: On track  Priority: High  Note:   Objective:  . Last practice recorded BP readings:  BP Readings from Last 3 Encounters:  01/11/20 (!) 145/88  11/19/19 (!) 143/88  10/08/19 (!) 142/92 .   [name] Most recent eGFR/CrCl: No results found for: EGFR  No components found for: CRCL Current Barriers:  . Difficulty obtaining medications- spoke with patient, he says he is doing OK but once again is struggling to pay for his medication copays, he says there has still been no decision with regards to his Medicaid application  . Unable to afford medication copay Case Manager Clinical Goal(s):  [name] Over the next 30-60 days, patient will verbalize understanding of plan for hypertension management Interventions:  . Evaluation of current treatment plan related to hypertension self management and patient's adherence to plan as established by provider. . Instructed patient's wife to take patient's blood pressure daily and call clinic for consistent readings > 140/90 . Reviewed the blood pressure treatment targets and explained why it is important to record pulse if BP readings are low . Will ask for nutritional consult with Debera Lat from provider if he qualifies for Medicaid . Requested patient's wife keep this CCM RN updated on status of Medicaid decision . [place]ay technician at the [place] to determine if patient has credit on his account there . Discussed plans with patient for ongoing care management follow up and provided patient with direct contact information for care management team . Advised patient, providing education and  rationale, to monitor blood pressure daily and record, calling PCP for findings outside established parameters.  Adine Madura pharmacy technician at the [employer] [facility] to determine if patient has any credit on his account there that could be used toward medication copays . Reviewed scheduled/upcoming provider appointments including: None at present- cannot afford clinic visit copay until he has health insurance Patient Goals/Self-Care Activities . Over the next 30-60 days, patient will:  - Self administers medications as prescribed Attends all scheduled provider appointments Calls provider office for new concerns, questions, or BP outside discussed parameters Checks BP and records as discussed Follows a low sodium diet/DASH diet Follow Up Plan: The care management team will reach out to the patient again over the next 30-60 days.       The patient verbalized understanding of instructions, educational materials, and care plan provided today and declined offer to receive copy of patient instructions, educational materials, and care plan.     Kelli Churn RN, CCM, [facility] RN Care Manager [phone]

## 2020-08-09 NOTE — Chronic Care Management (AMB) (Signed)
Care Management    RN Visit Note  08/09/2020 Name: Arthur [name] MRN: [id number] DOB: June 14, 1960  Subjective: Arthur [name] is a 60 y.o. year old male who is a primary care patient of [name], [name], MD. The care management team was consulted for assistance with disease management and care coordination needs.    Engaged with patient by telephone for follow up visit in response to provider referral for case management and/or care coordination services.   Consent to Services:   [name] was given information about Care Management services today including:  1. Care Management services includes personalized support from designated clinical staff supervised by his physician, including individualized plan of care and coordination with other care providers 2. 24/7 contact phone numbers for assistance for urgent and routine care needs. 3. The patient may stop case management services at any time by phone call to the office staff.  Patient agreed to services and consent obtained.   Assessment: Review of patient past medical history, allergies, medications, health status, including review of consultants reports, laboratory and other test data, was performed as part of comprehensive evaluation and provision of chronic care management services.   SDOH (Social Determinants of Health) assessments and interventions performed:    Care Plan  Allergies  Allergen Reactions   Penicillins Hives    Outpatient Encounter Medications as of 08/09/2020  Medication Sig   amLODipine (NORVASC) 10 MG tablet Take 1 tablet (10 mg total) by mouth daily.   atorvastatin (LIPITOR) 80 MG tablet Take 1 tablet (80 mg total) by mouth daily.   folic acid (FOLVITE) 1 MG tablet Take 1 tablet (1 mg total) by mouth daily. (Patient not taking: Reported on 04/11/2020)   lisinopril (ZESTRIL) 40 MG tablet Take 1 tablet (40 mg total) by mouth daily.   thiamine 100 MG tablet Take 1 tablet (100 mg total) by mouth daily.    No facility-administered encounter medications on file as of 08/09/2020.    Patient Active Problem List   Diagnosis Date Noted   Essential hypertension 10/08/2019   Cerebrovascular accident (CVA) ([facility]) 10/02/2019    Conditions to be addressed/monitored: HTN,  s/p stroke  Care Plan : Hypertension (Adult)  Updates made by [name], RN since 08/09/2020 12:00 AM    Problem: Disease Progression (Hypertension)   Priority: High  Onset Date: 05/24/2020    Goal: Disease Progression Prevented or Minimized   Start Date: 05/24/2020  Expected End Date: 08/25/2020  Recent Progress: On track  Priority: High  Note:   Objective:   Last practice recorded BP readings:  BP Readings from Last 3 Encounters:  01/11/20 (!) 145/88  11/19/19 (!) 143/88  10/08/19 (!) 142/92     Most recent eGFR/CrCl: No results found for: EGFR  No components found for: CRCL Current Barriers:   Difficulty obtaining medications- spoke with patient, he says he is doing OK but once again is struggling to pay for his medication copays, he says there has still been no decision with regards to his Medicaid application   Unable to afford medication copay Case Manager Clinical Goal(s):   Over the next 30-60 days, patient will verbalize understanding of plan for hypertension management Interventions:   Evaluation of current treatment plan related to hypertension self management and patient's adherence to plan as established by provider.  Instructed patient's wife to take patient's blood pressure daily and call clinic for consistent readings > 140/90  Reviewed the blood pressure treatment targets and explained why it is  important to record pulse if BP readings are low  Will ask for nutritional consult with Debera Lat from provider if he qualifies for Medicaid  Requested patient's wife keep this CCM RN updated on status of Medicaid decision  Messaged Roena Malady at the [facility] to determine if patient has credit on his account there  Discussed plans with patient for ongoing care management follow up and provided patient with direct contact information for care management team  Advised patient, providing education and rationale, to monitor blood pressure daily and record, calling PCP for findings outside established parameters.   [place] pharmacy technician at the [facility] clinic to determine if patient has any credit on his account there that could be used toward medication copays  Reviewed scheduled/upcoming provider appointments including: None at present- cannot afford clinic visit copay until he has health insurance Patient Goals/Self-Care Activities  Over the next 30-60 days, patient will:  - Self administers medications as prescribed Attends all scheduled provider appointments Calls provider office for new concerns, questions, or BP outside discussed parameters Checks BP and records as discussed Follows a low sodium diet/DASH diet Follow Up Plan: The care management team will reach out to the patient again over the next 30-60 days.       Kelli Churn RN, CCM, [facility] RN Care Manager [phone]

## 2020-08-09 NOTE — Progress Notes (Signed)
Internal Medicine Clinic Resident  I have personally reviewed this encounter including the documentation in this note and/or discussed this patient with the care management provider. I will address any urgent items identified by the care management provider and will communicate my actions to the patient's PCP. I have reviewed the patient's CCM visit with my supervising attending, Dr Raines.  Arthur Villwock, MD 08/09/2020   

## 2020-08-11 NOTE — Progress Notes (Signed)
Internal Medicine Clinic Attending  CCM services provided by the care management provider and their documentation were reviewed with Dr. Winters.  We reviewed the pertinent findings, urgent action items addressed by the resident and non-urgent items to be addressed by the PCP.  I agree with the assessment, diagnosis, and plan of care documented in the CCM and resident's note.  Alexander N Raines, MD 08/11/2020  

## 2020-08-12 ENCOUNTER — Ambulatory Visit: Payer: Self-pay | Admitting: *Deleted

## 2020-08-12 DIAGNOSIS — I639 Cerebral infarction, unspecified: Secondary | ICD-10-CM

## 2020-08-12 DIAGNOSIS — I10 Essential (primary) hypertension: Secondary | ICD-10-CM

## 2020-08-12 NOTE — Chronic Care Management (AMB) (Signed)
Care Management    RN Visit Note  08/12/2020 Name: ADEEL GUIFFRE MRN: [id number] DOB: March 30, 1961  Subjective: Arthur [name] is a 60 y.o. year old male who is a primary care patient of [name], [name], MD. The care management team was consulted for assistance with disease management and care coordination needs.    Engaged with patient by telephone for follow up visit in response to provider referral for case management and/or care coordination services.   Consent to Services:   Mr. Hollenbach was given information about Care Management services today including:  1. Care Management services includes personalized support from designated clinical staff supervised by his physician, including individualized plan of care and coordination with other care providers 2. 24/7 contact phone numbers for assistance for urgent and routine care needs. 3. The patient may stop case management services at any time by phone call to the office staff.  Patient agreed to services and consent obtained.   Assessment: Review of patient past medical history, allergies, medications, health status, including review of consultants reports, laboratory and other test data, was performed as part of comprehensive evaluation and provision of chronic care management services.   SDOH (Social Determinants of Health) assessments and interventions performed:    Care Plan  Allergies  Allergen Reactions  . Penicillins Hives    Outpatient Encounter Medications as of 08/12/2020  Medication Sig  . amLODipine (NORVASC) 10 MG tablet Take 1 tablet (10 mg total) by mouth daily.  [name] atorvastatin (LIPITOR) 80 MG tablet Take 1 tablet (80 mg total) by mouth daily.  . folic acid (FOLVITE) 1 MG tablet Take 1 tablet (1 mg total) by mouth daily. (Patient not taking: Reported on 04/11/2020)  . lisinopril (ZESTRIL) 40 MG tablet Take 1 tablet (40 mg total) by mouth daily.  [name] thiamine 100 MG tablet Take 1 tablet (100 mg total) by mouth daily.    No facility-administered encounter medications on file as of 08/12/2020.    Patient Active Problem List   Diagnosis Date Noted  . Essential hypertension 10/08/2019  . Cerebrovascular accident (CVA) ([facility]) 10/02/2019    Conditions to be addressed/monitored: HTN,  s/p stroke  Care Plan : Hypertension (Adult)  Updates made by [name], RN since 08/12/2020 12:00 AM    Problem: Disease Progression (Hypertension)   Priority: High  Onset Date: 05/24/2020    Goal: Disease Progression Prevented or Minimized   Start Date: 05/24/2020  Expected End Date: 11/24/2020  Recent Progress: On track  Priority: High  Note:   Objective:  . Last practice recorded BP readings:  BP Readings from Last 3 Encounters:  01/11/20 (!) 145/88  11/19/19 (!) 143/88  10/08/19 (!) 142/92 .   [name] Most recent eGFR/CrCl: No results found for: EGFR  No components found for: CRCL Current Barriers:  . Difficulty obtaining medications- spoke with patient, he says he is doing OK but once again is struggling to pay for his medication copays, he says there has still been no decision with regards to his Medicaid application  . Unable to afford medication copay Case Manager Clinical Goal(s):  [name] Over the next 30-60 days, patient will verbalize understanding of plan for hypertension management Interventions:  . Evaluation of current treatment plan related to hypertension self management and patient's adherence to plan as established by provider. . Instructed patient's wife to take patient's blood pressure daily and call clinic for consistent readings > 140/90 . Reviewed the blood pressure treatment targets and explained why it is  important to record pulse if BP readings are low . Will ask for nutritional consult with Debera Lat from provider if he qualifies for Medicaid . Requested patient's wife keep this CCM RN updated on status of Medicaid decision . Advised patient, providing education and rationale, to monitor  blood pressure daily and record, calling PCP for findings outside established parameters.  [place] pharmacy technician at the [facility] and [facility] clinic to determine if patient has any credit on his account there that could be used toward medication copays . 08/09/20- Delivered $10 from clinic's petty cash fund to apply to patient's account at the [facility] and Wellness clinic for medication copay assistance . 3/18- returned call to patient's significant other to advise there is $8 credit at the [employer]. Also advised her that there are no further clinic petty cash funds since they are available to clinic patient's once a year only . 3/18- Offered services of community care guide to assist patient with vocational rehabilitation resources to assist patient with finding employment  but wife states patient's long term disability is still pending and may take up to 6 months to determine  . Reviewed scheduled/upcoming provider appointments including: None at present- cannot afford clinic visit copay until he has health insurance Patient Goals/Self-Care Activities . Over the next 30-60 days, patient will:  - Self administers medications as prescribed Attends all scheduled provider appointments Calls provider office for new concerns, questions, or BP outside discussed parameters Checks BP and records as discussed Follows a low sodium diet/DASH diet Follow Up Plan: The care management team will reach out to the patient again over the next 30-60 days.       Plan: The care management team will reach out to the patient again over the next 30-60 days.  Kelli Churn RN, CCM, [facility] RN Care Manager [phone]

## 2020-08-12 NOTE — Progress Notes (Signed)
Internal Medicine Clinic Resident  I have personally reviewed this encounter including the documentation in this note and/or discussed this patient with the care management provider. I will address any urgent items identified by the care management provider and will communicate my actions to the patient's PCP. I have reviewed the patient's CCM visit with my supervising attending, Dr Raines.  Ourania Hamler, MD 08/12/2020   

## 2020-08-14 ENCOUNTER — Emergency Department (HOSPITAL_COMMUNITY): Payer: Medicaid Other

## 2020-08-14 ENCOUNTER — Emergency Department (HOSPITAL_COMMUNITY)
Admission: EM | Admit: 2020-08-14 | Discharge: 2020-08-14 | Disposition: A | Discharge status: Home / Self Care | Payer: Medicaid Other | Attending: Emergency Medicine | Admitting: Emergency Medicine

## 2020-08-14 ENCOUNTER — Encounter (HOSPITAL_COMMUNITY): Payer: Self-pay | Admitting: Emergency Medicine

## 2020-08-14 DIAGNOSIS — R61 Generalized hyperhidrosis: Secondary | ICD-10-CM | POA: Diagnosis not present

## 2020-08-14 DIAGNOSIS — I10 Essential (primary) hypertension: Secondary | ICD-10-CM | POA: Diagnosis not present

## 2020-08-14 DIAGNOSIS — R42 Dizziness and giddiness: Secondary | ICD-10-CM | POA: Diagnosis not present

## 2020-08-14 DIAGNOSIS — E860 Dehydration: Secondary | ICD-10-CM | POA: Insufficient documentation

## 2020-08-14 DIAGNOSIS — Z87891 Personal history of nicotine dependence: Secondary | ICD-10-CM | POA: Diagnosis not present

## 2020-08-14 DIAGNOSIS — R55 Syncope and collapse: Secondary | ICD-10-CM | POA: Diagnosis not present

## 2020-08-14 DIAGNOSIS — Z79899 Other long term (current) drug therapy: Secondary | ICD-10-CM | POA: Insufficient documentation

## 2020-08-14 DIAGNOSIS — H538 Other visual disturbances: Secondary | ICD-10-CM | POA: Diagnosis not present

## 2020-08-14 LAB — CBC WITH DIFFERENTIAL/PLATELET
Abs Immature Granulocytes: 0.01 10*3/uL (ref 0.00–0.07)
Basophils Absolute: 0.1 10*3/uL (ref 0.0–0.1)
Basophils Relative: 1 %
Eosinophils Absolute: 0 10*3/uL (ref 0.0–0.5)
Eosinophils Relative: 0 %
HCT: 35.3 % — ABNORMAL LOW (ref 39.0–52.0)
Hemoglobin: 12.4 g/dL — ABNORMAL LOW (ref 13.0–17.0)
Immature Granulocytes: 0 %
Lymphocytes Relative: 20 %
Lymphs Abs: 1 10*3/uL (ref 0.7–4.0)
MCH: 27.2 pg (ref 26.0–34.0)
MCHC: 35.1 g/dL (ref 30.0–36.0)
MCV: 77.4 fL — ABNORMAL LOW (ref 80.0–100.0)
Monocytes Absolute: 0.3 10*3/uL (ref 0.1–1.0)
Monocytes Relative: 6 %
Neutro Abs: 3.6 10*3/uL (ref 1.7–7.7)
Neutrophils Relative %: 73 %
Platelets: 201 10*3/uL (ref 150–400)
RBC: 4.56 MIL/uL (ref 4.22–5.81)
RDW: 16.7 % — ABNORMAL HIGH (ref 11.5–15.5)
WBC: 5 10*3/uL (ref 4.0–10.5)
nRBC: 0 % (ref 0.0–0.2)

## 2020-08-14 LAB — BASIC METABOLIC PANEL
Anion gap: 10 (ref 5–15)
BUN: 13 mg/dL (ref 6–20)
CO2: 23 mmol/L (ref 22–32)
Calcium: 8.4 mg/dL — ABNORMAL LOW (ref 8.9–10.3)
Chloride: 97 mmol/L — ABNORMAL LOW (ref 98–111)
Creatinine, Ser: 1.35 mg/dL — ABNORMAL HIGH (ref 0.61–1.24)
GFR, Estimated: >60 mL/min (ref >60)
Glucose, Bld: 110 mg/dL — ABNORMAL HIGH (ref 70–99)
Potassium: 4.3 mmol/L (ref 3.5–5.1)
Sodium: 130 mmol/L — ABNORMAL LOW (ref 135–145)

## 2020-08-14 LAB — CBG MONITORING, ED: Glucose-Capillary: 117 mg/dL — ABNORMAL HIGH (ref 70–99)

## 2020-08-14 LAB — ETHANOL: Alcohol, Ethyl (B): <10 mg/dL (ref <10)

## 2020-08-14 MED ORDER — SODIUM CHLORIDE 0.9 % IV BOLUS
1000.0000 mL | Freq: Once | INTRAVENOUS | Status: AC
  Administered 2020-08-14: 1000 mL via INTRAVENOUS

## 2020-08-14 NOTE — ED Triage Notes (Signed)
Pt comes from home via EMS. Called out for syncopal episode, pt syncopized when getting off couch and fell on floor. Pt remained on floor for approx 10 minutes. EMS reports pt was pale, diaphoretic, with weak radial pulses upon their arrival. Wife reported pt had speech deficits and memory deficits from previous strokes. Pt is a/o x4 at this time. 18g IV established LAC. No meds given. BP 174/117 HR 74 O2 95% RA CBG 183 RR 18

## 2020-08-14 NOTE — Progress Notes (Signed)
Internal Medicine Clinic Attending  CCM services provided by the care management provider and their documentation were reviewed with Dr. Winters.  We reviewed the pertinent findings, urgent action items addressed by the resident and non-urgent items to be addressed by the PCP.  I agree with the assessment, diagnosis, and plan of care documented in the CCM and resident's note.  Aravind Chrismer N Yael Angerer, MD 08/14/2020  

## 2020-08-14 NOTE — Discharge Instructions (Signed)
Need to follow up with your regular doctor this week for recheck.  Make sure you stand up slowly and wait to make sure you are not dizzy.  REturn if you develop chest pain, shortness of breath or any further passing out episodes

## 2020-08-14 NOTE — ED Provider Notes (Signed)
[facility] EMERGENCY DEPARTMENT Provider Note   CSN: [id number] Arrival date & time: 08/14/20  1303     History Chief Complaint  Patient presents with  . Loss of Consciousness    Arthur [name] is a 60 y.o. male.  He has a history of stroke which left him with some speech and memory deficits.  He said he was getting off the couch and walking in his house when he became acutely lightheaded and had a syncopal event falling to the ground.  Said he hit his face.  Denies any facial pain or headache.  States he just feels kind of shaky and weak all over.  Had only eaten 2 cookies this morning.  Denies any new stroke deficits.  No chest pain or shortness of breath no vomiting or diarrhea.  EMS states he was pale and diaphoretic initially with weak radial pulses.  The history is provided by the patient.  Loss of Consciousness Episode history:  Single Most recent episode:  Today Progression:  Resolved Chronicity:  New Context: standing up   Witnessed: yes   Relieved by:  Nothing Worsened by:  Nothing Ineffective treatments:  None tried Associated symptoms: diaphoresis, dizziness and malaise/fatigue   Associated symptoms: no chest pain, no fever, no focal weakness, no headaches, no nausea, no shortness of breath and no vomiting        Past Medical History:  Diagnosis Date  . ETOH abuse   . Hypertension     Patient Active Problem List   Diagnosis Date Noted  . Essential hypertension 10/08/2019  . Cerebrovascular accident (CVA) ([facility]) 10/02/2019    Past Surgical History:  Procedure Laterality Date  . HEMORRHOID SURGERY         Family History  Problem Relation Age of Onset  . Diabetes Mother   . Hypertension Mother   . Diabetes Father   . Hypertension Father   . Hypertension Sister     Social History   Tobacco Use  . Smoking status: Former Smoker    Years: 20.00    Types: Cigarettes, Cigars    Quit date: 09/30/2019    Years since quitting: 0.8  .  Smokeless tobacco: Never Used  . Tobacco comment: currently smokes 2 cigars/day  Substance Use Topics  . Alcohol use: Yes  . Drug use: Never    Home Medications Prior to Admission medications   Medication Sig Start Date End Date Taking? Authorizing Provider  amLODipine (NORVASC) 10 MG tablet Take 1 tablet (10 mg total) by mouth daily. 11/19/19 02/17/20  [name], Shawna Orleans, MD  atorvastatin (LIPITOR) 80 MG tablet Take 1 tablet (80 mg total) by mouth daily. 11/19/19 02/17/20  [name], Shawna Orleans, MD  folic acid (FOLVITE) 1 MG tablet Take 1 tablet (1 mg total) by mouth daily. Patient not taking: Reported on 04/11/2020 10/05/19   Kirt Boys, MD  lisinopril (ZESTRIL) 40 MG tablet Take 1 tablet (40 mg total) by mouth daily. 04/23/20 07/22/20  Claudean Severance, MD  thiamine 100 MG tablet Take 1 tablet (100 mg total) by mouth daily. 10/05/19   Kirt Boys, MD    Allergies    Penicillins  Review of Systems   Review of Systems  Constitutional: Positive for diaphoresis and malaise/fatigue. Negative for fever.  HENT: Negative for sore throat.   Eyes: Negative for visual disturbance.  Respiratory: Negative for shortness of breath.   Cardiovascular: Positive for syncope. Negative for chest pain.  Gastrointestinal: Negative for nausea and vomiting.  Genitourinary: Negative  for dysuria.  Musculoskeletal: Negative for neck pain.  Skin: Negative for wound.  Neurological: Positive for dizziness, syncope, speech difficulty (baseline) and light-headedness. Negative for focal weakness and headaches.    Physical Exam Updated Vital Signs BP (!) 153/103   Pulse 75   Temp 98.1 F (36.7 C) (Oral)   Resp (!) 22   SpO2 100%   Physical Exam Vitals and nursing note reviewed.  Constitutional:      Appearance: Normal appearance. He is well-developed.  HENT:     Head: Normocephalic and atraumatic.  Eyes:     Conjunctiva/sclera: Conjunctivae normal.  Cardiovascular:     Rate and  Rhythm: Normal rate and regular rhythm.     Heart sounds: No murmur heard.   Pulmonary:     Effort: Pulmonary effort is normal. No respiratory distress.     Breath sounds: Normal breath sounds.  Abdominal:     Palpations: Abdomen is soft.     Tenderness: There is no abdominal tenderness. There is no guarding or rebound.  Musculoskeletal:        General: No deformity or signs of injury. Normal range of motion.     Cervical back: Neck supple.  Skin:    General: Skin is warm and dry.     Capillary Refill: Capillary refill takes less than 2 seconds.  Neurological:     General: No focal deficit present.     Mental Status: He is alert. Mental status is at baseline.     Sensory: No sensory deficit.     Motor: No weakness.     ED Results / Procedures / Treatments   Labs (all labs ordered are listed, but only abnormal results are displayed) Labs Reviewed  BASIC METABOLIC PANEL - Abnormal; Notable for the following components:      Result Value   Sodium 130 (*)    Chloride 97 (*)    Glucose, Bld 110 (*)    Creatinine, Ser 1.35 (*)    Calcium 8.4 (*)    All other components within normal limits  CBC WITH DIFFERENTIAL/PLATELET - Abnormal; Notable for the following components:   Hemoglobin 12.4 (*)    HCT 35.3 (*)    MCV 77.4 (*)    RDW 16.7 (*)    All other components within normal limits  CBG MONITORING, ED - Abnormal; Notable for the following components:   Glucose-Capillary 117 (*)    All other components within normal limits  ETHANOL    EKG EKG Interpretation  Date/Time:  Sunday August 14 2020 13:14:33 EDT Ventricular Rate:  78 PR Interval:    QRS Duration: 95 QT Interval:  408 QTC Calculation: 465 R Axis:   56 Text Interpretation: Sinus rhythm Consider right atrial enlargement Borderline T abnormalities, lateral leads No significant change since prior 5/21 Confirmed by Meridee Score [phone]) on 08/14/2020 1:21:01 PM   Radiology CT HEAD WO CONTRAST  Result Date:  08/14/2020 CLINICAL DATA:  Mental status change, unknown cause; syncope, fall. EXAM: CT HEAD WITHOUT CONTRAST TECHNIQUE: Contiguous axial images were obtained from the base of the skull through the vertex without intravenous contrast. COMPARISON:  Brain MRI 10/03/2019.  Head CT 10/02/2019. FINDINGS: Brain: Mild cerebral atrophy. Redemonstrated chronic cortically based left temporoparietal infarct. Redemonstrated chronic small-vessel infarcts within the right basal ganglia and bilateral thalami. Background mild patchy and ill-defined hypoattenuation within the cerebral white matter is nonspecific, but compatible with chronic small vessel ischemic disease. A lacunar infarct within the left pons is chronic in appearance,  although new as compared to the prior brain MRI of 10/03/2019. There is no acute intracranial hemorrhage. No acute demarcated cortical infarct. No extra-axial fluid collection. No evidence of intracranial mass. No midline shift. Partially empty sella turcica. Vascular: No hyperdense vessel.  Atherosclerotic calcifications Skull: Normal. Negative for fracture or focal lesion. Sinuses/Orbits: Visualized orbits show no acute finding. Moderate mucosal thickening and possible mucous retention cysts within the bilateral maxillary sinuses. IMPRESSION: No evidence of acute intracranial abnormality. Redemonstrated chronic cortically based left temporoparietal infarct. Redemonstrated chronic small-vessel infarcts within the right basal ganglia and bilateral thalami. Background mild cerebral white matter chronic small vessel ischemic disease. A lacunar infarct within the left pons is new as compared to the brain MRI of 08/03/2019, but appears chronic. Mild cerebral atrophy Moderate mucosal thickening and possible mucous retention cysts within the bilateral maxillary sinuses. Electronically Signed   By: Jackey Loge DO   On: 08/14/2020 14:47    Procedures Procedures   Medications Ordered in ED Medications   sodium chloride 0.9 % bolus 1,000 mL (has no administration in time range)    ED Course  I have reviewed the triage vital signs and the nursing notes.  Pertinent labs & imaging results that were available during my care of the patient were reviewed by me and considered in my medical decision making (see chart for details).    MDM Rules/Calculators/A&P                         This patient complains of lightheadedness and syncope; this involves an extensive number of treatment Options and is a complaint that carries with it a high risk of complications and Morbidity. The differential includes dehydration, anemia, arrhythmia, stroke, bleed  I ordered, reviewed and interpreted labs, which included CBC with normal white count, hemoglobin slightly lower than priors, chemistries with low sodium evaded creatinine possibly reflecting some dehydration alcohol negative I ordered medication IV fluids I ordered imaging studies which included head CT and I independently    visualized and interpreted imaging which showed no acute stroke Additional history obtained from EMS Previous records obtained and reviewed in epic including prior ED admission last year for stroke  After the interventions stated above, I reevaluated the patient and found patient to be asymptomatic in the bed although feels lightheaded and shaky when standing.  We will give more IV fluids.  Patient signed out to oncoming provider Dr. Anitra Lauth reassess after second IV fluid bolus.   Final Clinical Impression(s) / ED Diagnoses Final diagnoses:  Syncope and collapse  Dehydration    Rx / DC Orders ED Discharge Orders    None       Terrilee Files, MD 08/14/20 1745

## 2020-08-14 NOTE — ED Notes (Signed)
Pt shaky and stated that he felt dizzy while standing for orthostatic vitals. Did not ambulate, notified MD.

## 2020-08-14 NOTE — ED Notes (Signed)
Pt ambulated with NT, gait slow but steady and even.  1 assist for pt safety. Pt denies dizziness and states that "shakiness" is baseline for him

## 2020-08-15 MED FILL — LISINOPRIL 40 MG TAB: 40 | 30 days supply | Qty: 30 | Fill #0

## 2020-08-16 MED FILL — ?ATORVASTATIN 40MG TABLET: 40 | 30 days supply | Qty: 60 | Fill #4

## 2020-08-16 MED FILL — AMLODIPINE BESYLATE 5 MG TA: 5 | 30 days supply | Qty: 60 | Fill #4

## 2020-08-27 ENCOUNTER — Other Ambulatory Visit: Payer: Self-pay

## 2020-09-12 ENCOUNTER — Ambulatory Visit: Payer: Self-pay | Admitting: *Deleted

## 2020-09-12 DIAGNOSIS — I10 Essential (primary) hypertension: Secondary | ICD-10-CM

## 2020-09-12 DIAGNOSIS — I639 Cerebral infarction, unspecified: Secondary | ICD-10-CM

## 2020-09-12 NOTE — Patient Instructions (Signed)
Visit Information It was nice speaking with your wife  today. Goals Addressed            This Visit's Progress   . Secure long term medication copay assistance       Timeframe:  Long-Range Goal Priority:  High Start Date:      05/24/20                       Expected End Date:     11/24/20                  Follow Up Date 10/11/20   - call for medicine refill 2 or 3 days before it runs out - call if I am sick and can't take my medicine  - notify clinic pharmacist for medication copay assistance at least one week prior to needing refills   Why is this important?   . These steps will help you keep on track with your medicines.   Notes: 09/12/20- pt's significant other says he is taking his medications as directed, no concerns about making medication copays this month       The patient verbalized understanding of instructions, educational materials, and care plan provided today and declined offer to receive copy of patient instructions, educational materials, and care plan.   The care management team will reach out to the patient again over the next 30-60 days.   Cranford Mon RN, CCM, [facility] RN Care Manager [phone]

## 2020-09-12 NOTE — Chronic Care Management (AMB) (Signed)
Care Management    RN Visit Note  09/12/2020 Name: Arthur [name] MRN: [id number] DOB: 1960/07/21  Subjective: Arthur [name] is a 60 y.o. year old male who is a primary care patient of [name], [name], MD. The care management team was consulted for assistance with disease management and care coordination needs.    Engaged with patient by telephone for follow up visit in response to provider referral for case management and/or care coordination services.   Consent to Services:   [name] was given information about Care Management services today including:  1. Care Management services includes personalized support from designated clinical staff supervised by his physician, including individualized plan of care and coordination with other care providers 2. 24/7 contact phone numbers for assistance for urgent and routine care needs. 3. The patient may stop case management services at any time by phone call to the office staff.  Patient agreed to services and consent obtained.   Assessment: Review of patient past medical history, allergies, medications, health status, including review of consultants reports, laboratory and other test data, was performed as part of comprehensive evaluation and provision of chronic care management services.   SDOH (Social Determinants of Health) assessments and interventions performed:    Care Plan  Allergies  Allergen Reactions  . Penicillins Hives    Outpatient Encounter Medications as of 09/12/2020  Medication Sig Note  . amLODipine (NORVASC) 10 MG tablet Take 1 tablet (10 mg total) by mouth daily. (Patient not taking: Reported on 08/14/2020)   . amLODipine (NORVASC) 5 MG tablet Take 10 mg by mouth daily. 08/14/2020: Ran out  . amLODipine (NORVASC) 5 MG tablet TAKE 2 TABLETS (10 MG TOTAL) BY MOUTH DAILY.   [name] aspirin EC 81 MG tablet Take 81 mg by mouth daily. Swallow whole.   [name] atorvastatin (LIPITOR) 40 MG tablet Take 80 mg by mouth daily.   [name]  atorvastatin (LIPITOR) 40 MG tablet TAKE 2 TABLETS (80 MG TOTAL) BY MOUTH DAILY.   [name] atorvastatin (LIPITOR) 80 MG tablet Take 1 tablet (80 mg total) by mouth daily. (Patient not taking: Reported on 08/14/2020)   . folic acid (FOLVITE) 1 MG tablet Take 1 tablet (1 mg total) by mouth daily. (Patient not taking: No sig reported)   . lisinopril (ZESTRIL) 40 MG tablet TAKE 1 TABLET (40 MG TOTAL) BY MOUTH DAILY. 08/14/2020: Ran out  . thiamine 100 MG tablet Take 1 tablet (100 mg total) by mouth daily. (Patient not taking: No sig reported)    No facility-administered encounter medications on file as of 09/12/2020.    Patient Active Problem List   Diagnosis Date Noted  . Essential hypertension 10/08/2019  . Cerebrovascular accident (CVA) ([place]) 10/02/2019    Conditions to be addressed/monitored: HTN,  s/p stroke  Care Plan : Hypertension (Adult)  Updates made by Arthur Ellison, RN since 09/12/2020 12:00 AM    Problem: Disease Progression (Hypertension)   Priority: High  Onset Date: 05/24/2020    Goal: Disease Progression Prevented or Minimized   Start Date: 05/24/2020  Expected End Date: 11/24/2020  Recent Progress: On track  Priority: High  Note:   Objective:  . Last practice recorded BP readings:  BP Readings from Last 3 Encounters:  08/14/20 (!) 149/91  01/11/20 (!) 145/88  11/19/19 (!) 143/88    . Most recent eGFR/CrCl: No results found for: EGFR  No components found for: CRCL Current Barriers:  . Difficulty obtaining medications- spoke with patient's significant other to update  on patient's HTN since she monitors his blood pressure . Unable to afford medication copay Case Manager Clinical Goal(s):  [name] Over the next 30-60 days, patient will verbalize understanding of plan for hypertension management Interventions:  . Evaluation of current treatment plan related to hypertension self management and patient's adherence to plan as established by provider. . Discussed cause and  treatment received during patient's ED visit of 3/20;  reviewed elevated BP readings in ED  . Assessed patient's BP readings taken by wife . Reminded patient's wife to take patient's blood pressure daily and call clinic for consistent readings > 140/90 . Reviewed the blood pressure treatment targets and explained why it is important to record pulse if BP readings are low . Will ask for nutritional consult with Arthur [name] from provider if he qualifies for Medicaid . Requested patient's wife keep this CCM RN updated on status of Medicaid decision . Advised patient, providing education and rationale, to monitor blood pressure daily and record, calling PCP for findings outside established parameters.  Arthur [name] pharmacy technician at the [facility] to determine if patient has any credit on his account there that could be used toward medication copays . 08/09/20- Delivered $10 from clinic's petty cash fund to apply to patient's account at the [facility] for medication copay assistance . 3/18- returned call to patient's significant other to advise there is $8 credit at the [employer]. Also advised her that there are no further clinic petty cash funds since they are available to clinic patient's once a year only . 3/18- Offered services of community care guide to assist patient with vocational rehabilitation resources to assist patient with finding employment  but wife states patient's long term disability is still pending and may take up to 6 months to determine  . Reviewed scheduled/upcoming provider appointments including: None at present- cannot afford clinic visit copay until he has health insurance Patient Goals/Self-Care Activities . Over the next 30-60 days, patient will:  - Self administers medications as prescribed Attends all scheduled provider appointments Calls provider office for new concerns,  questions, or BP outside discussed parameters Checks BP and records as discussed Follows a low sodium diet/DASH diet Follow Up Plan: The care management team will reach out to the patient again over the next 30-60 days.       Kelli Churn RN, CCM, [facility] RN Care Manager [phone]

## 2020-09-16 ENCOUNTER — Other Ambulatory Visit: Payer: Self-pay

## 2020-09-16 MED FILL — Amlodipine Besylate Tab 5 MG (Base Equivalent): ORAL | 30 days supply | Qty: 60 | Fill #0 | Status: AC

## 2020-09-16 MED FILL — Atorvastatin Calcium Tab 40 MG (Base Equivalent): ORAL | 30 days supply | Qty: 60 | Fill #0 | Status: CN

## 2020-09-16 MED FILL — Lisinopril Tab 40 MG: ORAL | 30 days supply | Qty: 30 | Fill #0 | Status: AC

## 2020-09-19 ENCOUNTER — Other Ambulatory Visit: Payer: Self-pay

## 2020-09-20 ENCOUNTER — Other Ambulatory Visit: Payer: Self-pay

## 2020-09-20 MED FILL — Atorvastatin Calcium Tab 40 MG (Base Equivalent): ORAL | 30 days supply | Qty: 180 | Fill #0 | Status: AC

## 2020-10-03 ENCOUNTER — Other Ambulatory Visit

## 2020-10-10 ENCOUNTER — Encounter (INDEPENDENT_AMBULATORY_CARE_PROVIDER_SITE_OTHER): Admitting: Family Medicine

## 2020-10-11 ENCOUNTER — Telehealth: Payer: Self-pay

## 2020-10-12 ENCOUNTER — Other Ambulatory Visit

## 2020-10-16 ENCOUNTER — Emergency Department (HOSPITAL_COMMUNITY): Payer: Medicaid Other

## 2020-10-16 ENCOUNTER — Inpatient Hospital Stay (HOSPITAL_COMMUNITY)
Admission: EM | Admit: 2020-10-16 | Discharge: 2020-10-25 | DRG: 100 | Disposition: A | Discharge status: Home Health | Payer: Medicaid Other | Attending: Internal Medicine | Admitting: Internal Medicine

## 2020-10-16 ENCOUNTER — Inpatient Hospital Stay (HOSPITAL_COMMUNITY): Payer: Medicaid Other

## 2020-10-16 DIAGNOSIS — K760 Fatty (change of) liver, not elsewhere classified: Secondary | ICD-10-CM | POA: Diagnosis present

## 2020-10-16 DIAGNOSIS — I69320 Aphasia following cerebral infarction: Secondary | ICD-10-CM

## 2020-10-16 DIAGNOSIS — F10239 Alcohol dependence with withdrawal, unspecified: Secondary | ICD-10-CM | POA: Diagnosis present

## 2020-10-16 DIAGNOSIS — R29810 Facial weakness: Secondary | ICD-10-CM | POA: Diagnosis present

## 2020-10-16 DIAGNOSIS — Z781 Physical restraint status: Secondary | ICD-10-CM | POA: Diagnosis not present

## 2020-10-16 DIAGNOSIS — D62 Acute posthemorrhagic anemia: Secondary | ICD-10-CM | POA: Diagnosis not present

## 2020-10-16 DIAGNOSIS — E871 Hypo-osmolality and hyponatremia: Secondary | ICD-10-CM | POA: Diagnosis present

## 2020-10-16 DIAGNOSIS — J9602 Acute respiratory failure with hypercapnia: Secondary | ICD-10-CM | POA: Diagnosis present

## 2020-10-16 DIAGNOSIS — R471 Dysarthria and anarthria: Secondary | ICD-10-CM | POA: Diagnosis present

## 2020-10-16 DIAGNOSIS — K7010 Alcoholic hepatitis without ascites: Secondary | ICD-10-CM | POA: Diagnosis present

## 2020-10-16 DIAGNOSIS — G40909 Epilepsy, unspecified, not intractable, without status epilepticus: Secondary | ICD-10-CM | POA: Diagnosis present

## 2020-10-16 DIAGNOSIS — F22 Delusional disorders: Secondary | ICD-10-CM | POA: Diagnosis not present

## 2020-10-16 DIAGNOSIS — N179 Acute kidney failure, unspecified: Secondary | ICD-10-CM | POA: Diagnosis present

## 2020-10-16 DIAGNOSIS — E876 Hypokalemia: Secondary | ICD-10-CM | POA: Diagnosis not present

## 2020-10-16 DIAGNOSIS — L8990 Pressure ulcer of unspecified site, unspecified stage: Secondary | ICD-10-CM | POA: Insufficient documentation

## 2020-10-16 DIAGNOSIS — Z20822 Contact with and (suspected) exposure to covid-19: Secondary | ICD-10-CM | POA: Diagnosis present

## 2020-10-16 DIAGNOSIS — E785 Hyperlipidemia, unspecified: Secondary | ICD-10-CM | POA: Diagnosis present

## 2020-10-16 DIAGNOSIS — E861 Hypovolemia: Secondary | ICD-10-CM | POA: Diagnosis present

## 2020-10-16 DIAGNOSIS — E778 Other disorders of glycoprotein metabolism: Secondary | ICD-10-CM | POA: Diagnosis not present

## 2020-10-16 DIAGNOSIS — R29705 NIHSS score 5: Secondary | ICD-10-CM | POA: Diagnosis present

## 2020-10-16 DIAGNOSIS — E43 Unspecified severe protein-calorie malnutrition: Secondary | ICD-10-CM | POA: Insufficient documentation

## 2020-10-16 DIAGNOSIS — Z8249 Family history of ischemic heart disease and other diseases of the circulatory system: Secondary | ICD-10-CM

## 2020-10-16 DIAGNOSIS — I10 Essential (primary) hypertension: Secondary | ICD-10-CM | POA: Diagnosis present

## 2020-10-16 DIAGNOSIS — E1165 Type 2 diabetes mellitus with hyperglycemia: Secondary | ICD-10-CM | POA: Diagnosis present

## 2020-10-16 DIAGNOSIS — R7401 Elevation of levels of liver transaminase levels: Secondary | ICD-10-CM | POA: Diagnosis not present

## 2020-10-16 DIAGNOSIS — D6959 Other secondary thrombocytopenia: Secondary | ICD-10-CM | POA: Diagnosis present

## 2020-10-16 DIAGNOSIS — E872 Acidosis: Secondary | ICD-10-CM | POA: Diagnosis not present

## 2020-10-16 DIAGNOSIS — R0603 Acute respiratory distress: Secondary | ICD-10-CM | POA: Diagnosis not present

## 2020-10-16 DIAGNOSIS — E8809 Other disorders of plasma-protein metabolism, not elsewhere classified: Secondary | ICD-10-CM | POA: Diagnosis not present

## 2020-10-16 DIAGNOSIS — L89812 Pressure ulcer of head, stage 2: Secondary | ICD-10-CM | POA: Diagnosis not present

## 2020-10-16 DIAGNOSIS — I639 Cerebral infarction, unspecified: Secondary | ICD-10-CM

## 2020-10-16 DIAGNOSIS — Z79899 Other long term (current) drug therapy: Secondary | ICD-10-CM

## 2020-10-16 DIAGNOSIS — R000 Tachycardia, unspecified: Secondary | ICD-10-CM | POA: Diagnosis not present

## 2020-10-16 DIAGNOSIS — G928 Other toxic encephalopathy: Secondary | ICD-10-CM | POA: Diagnosis present

## 2020-10-16 DIAGNOSIS — Z833 Family history of diabetes mellitus: Secondary | ICD-10-CM

## 2020-10-16 DIAGNOSIS — R4781 Slurred speech: Secondary | ICD-10-CM | POA: Diagnosis not present

## 2020-10-16 DIAGNOSIS — Z978 Presence of other specified devices: Secondary | ICD-10-CM

## 2020-10-16 DIAGNOSIS — Z87891 Personal history of nicotine dependence: Secondary | ICD-10-CM

## 2020-10-16 DIAGNOSIS — G9340 Encephalopathy, unspecified: Secondary | ICD-10-CM | POA: Diagnosis present

## 2020-10-16 DIAGNOSIS — E7220 Disorder of urea cycle metabolism, unspecified: Secondary | ICD-10-CM | POA: Diagnosis not present

## 2020-10-16 DIAGNOSIS — Z880 Allergy status to penicillin: Secondary | ICD-10-CM

## 2020-10-16 DIAGNOSIS — Z9911 Dependence on respirator [ventilator] status: Secondary | ICD-10-CM | POA: Diagnosis not present

## 2020-10-16 DIAGNOSIS — Z7982 Long term (current) use of aspirin: Secondary | ICD-10-CM

## 2020-10-16 LAB — I-STAT ARTERIAL BLOOD GAS, ED
Acid-base deficit: 4 mmol/L — ABNORMAL HIGH (ref 0.0–2.0)
Acid-base deficit: 4 mmol/L — ABNORMAL HIGH (ref 0.0–2.0)
Bicarbonate: 21.9 mmol/L (ref 20.0–28.0)
Bicarbonate: 21 mmol/L (ref 20.0–28.0)
Calcium, Ion: 0.97 mmol/L — ABNORMAL LOW (ref 1.15–1.40)
Calcium, Ion: 1.01 mmol/L — ABNORMAL LOW (ref 1.15–1.40)
HCT: 40 % (ref 39.0–52.0)
HCT: 38 % — ABNORMAL LOW (ref 39.0–52.0)
Hemoglobin: 13.6 g/dL (ref 13.0–17.0)
Hemoglobin: 12.9 g/dL — ABNORMAL LOW (ref 13.0–17.0)
O2 Saturation: 100 %
O2 Saturation: 96 %
Patient temperature: 102.1
Patient temperature: 102.2
Potassium: 5 mmol/L (ref 3.5–5.1)
Potassium: 4.6 mmol/L (ref 3.5–5.1)
Sodium: 136 mmol/L (ref 135–145)
Sodium: 136 mmol/L (ref 135–145)
TCO2: 23 mmol/L (ref 22–32)
TCO2: 22 mmol/L (ref 22–32)
pCO2 arterial: 47.2 mmHg (ref 32.0–48.0)
pCO2 arterial: 42.3 mmHg (ref 32.0–48.0)
pH, Arterial: 7.284 — ABNORMAL LOW (ref 7.350–7.450)
pH, Arterial: 7.313 — ABNORMAL LOW (ref 7.350–7.450)
pO2, Arterial: 261 mmHg — ABNORMAL HIGH (ref 83.0–108.0)
pO2, Arterial: 95 mmHg (ref 83.0–108.0)

## 2020-10-16 LAB — DIFFERENTIAL
Abs Immature Granulocytes: 0.04 10*3/uL (ref 0.00–0.07)
Basophils Absolute: 0.1 10*3/uL (ref 0.0–0.1)
Basophils Relative: 1 %
Eosinophils Absolute: 0 10*3/uL (ref 0.0–0.5)
Eosinophils Relative: 0 %
Immature Granulocytes: 1 %
Lymphocytes Relative: 10 %
Lymphs Abs: 0.8 10*3/uL (ref 0.7–4.0)
Monocytes Absolute: 0.3 10*3/uL (ref 0.1–1.0)
Monocytes Relative: 4 %
Neutro Abs: 6.8 10*3/uL (ref 1.7–7.7)
Neutrophils Relative %: 84 %

## 2020-10-16 LAB — HEMOGLOBIN A1C
Hgb A1c MFr Bld: 6.8 % — ABNORMAL HIGH (ref 4.8–5.6)
Mean Plasma Glucose: 148.46 mg/dL

## 2020-10-16 LAB — COMPREHENSIVE METABOLIC PANEL
ALT: 405 U/L — ABNORMAL HIGH (ref 0–44)
AST: 1350 U/L — ABNORMAL HIGH (ref 15–41)
Albumin: 2.7 g/dL — ABNORMAL LOW (ref 3.5–5.0)
Alkaline Phosphatase: 420 U/L — ABNORMAL HIGH (ref 38–126)
Anion gap: 15 (ref 5–15)
BUN: 33 mg/dL — ABNORMAL HIGH (ref 6–20)
CO2: 16 mmol/L — ABNORMAL LOW (ref 22–32)
Calcium: 7.7 mg/dL — ABNORMAL LOW (ref 8.9–10.3)
Chloride: 98 mmol/L (ref 98–111)
Creatinine, Ser: 3.31 mg/dL — ABNORMAL HIGH (ref 0.61–1.24)
GFR, Estimated: 21 mL/min — ABNORMAL LOW (ref >60)
Glucose, Bld: 188 mg/dL — ABNORMAL HIGH (ref 70–99)
Potassium: 4.6 mmol/L (ref 3.5–5.1)
Sodium: 129 mmol/L — ABNORMAL LOW (ref 135–145)
Total Bilirubin: 3.2 mg/dL — ABNORMAL HIGH (ref 0.3–1.2)
Total Protein: 7.1 g/dL (ref 6.5–8.1)

## 2020-10-16 LAB — CBC
HCT: 37.1 % — ABNORMAL LOW (ref 39.0–52.0)
Hemoglobin: 12.5 g/dL — ABNORMAL LOW (ref 13.0–17.0)
MCH: 26.6 pg (ref 26.0–34.0)
MCHC: 33.7 g/dL (ref 30.0–36.0)
MCV: 78.9 fL — ABNORMAL LOW (ref 80.0–100.0)
Platelets: 107 10*3/uL — ABNORMAL LOW (ref 150–400)
RBC: 4.7 MIL/uL (ref 4.22–5.81)
RDW: 16.4 % — ABNORMAL HIGH (ref 11.5–15.5)
WBC: 8.1 10*3/uL (ref 4.0–10.5)
nRBC: 0 % (ref 0.0–0.2)

## 2020-10-16 LAB — RESP PANEL BY RT-PCR (FLU A&B, COVID) ARPGX2
Influenza A by PCR: NEGATIVE
Influenza B by PCR: NEGATIVE
SARS Coronavirus 2 by RT PCR: NEGATIVE

## 2020-10-16 LAB — PROTIME-INR
INR: 1.2 (ref 0.8–1.2)
Prothrombin Time: 15.3 seconds — ABNORMAL HIGH (ref 11.4–15.2)

## 2020-10-16 LAB — I-STAT CHEM 8, ED
BUN: 37 mg/dL — ABNORMAL HIGH (ref 6–20)
Calcium, Ion: 0.96 mmol/L — ABNORMAL LOW (ref 1.15–1.40)
Chloride: 104 mmol/L (ref 98–111)
Creatinine, Ser: 3.5 mg/dL — ABNORMAL HIGH (ref 0.61–1.24)
Glucose, Bld: 181 mg/dL — ABNORMAL HIGH (ref 70–99)
HCT: 45 % (ref 39.0–52.0)
Hemoglobin: 15.3 g/dL (ref 13.0–17.0)
Potassium: 4.7 mmol/L (ref 3.5–5.1)
Sodium: 132 mmol/L — ABNORMAL LOW (ref 135–145)
TCO2: 21 mmol/L — ABNORMAL LOW (ref 22–32)

## 2020-10-16 LAB — SALICYLATE LEVEL: Salicylate Lvl: <7 mg/dL — ABNORMAL LOW (ref 7.0–30.0)

## 2020-10-16 LAB — GLUCOSE, CAPILLARY
Glucose-Capillary: 100 mg/dL — ABNORMAL HIGH (ref 70–99)
Glucose-Capillary: 128 mg/dL — ABNORMAL HIGH (ref 70–99)

## 2020-10-16 LAB — CBG MONITORING, ED: Glucose-Capillary: 110 mg/dL — ABNORMAL HIGH (ref 70–99)

## 2020-10-16 LAB — APTT: aPTT: 32 seconds (ref 24–36)

## 2020-10-16 LAB — HIV ANTIBODY (ROUTINE TESTING W REFLEX): HIV Screen 4th Generation wRfx: NONREACTIVE

## 2020-10-16 LAB — ACETAMINOPHEN LEVEL: Acetaminophen (Tylenol), Serum: 14 ug/mL (ref 10–30)

## 2020-10-16 LAB — PHOSPHORUS: Phosphorus: 4.5 mg/dL (ref 2.5–4.6)

## 2020-10-16 LAB — MAGNESIUM: Magnesium: 1.6 mg/dL — ABNORMAL LOW (ref 1.7–2.4)

## 2020-10-16 LAB — AMMONIA: Ammonia: 70 umol/L — ABNORMAL HIGH (ref 9–35)

## 2020-10-16 LAB — LIPASE, BLOOD: Lipase: 145 U/L — ABNORMAL HIGH (ref 11–51)

## 2020-10-16 LAB — ETHANOL: Alcohol, Ethyl (B): <10 mg/dL (ref <10)

## 2020-10-16 LAB — TROPONIN I (HIGH SENSITIVITY): Troponin I (High Sensitivity): 140 ng/L (ref <18)

## 2020-10-16 LAB — LACTIC ACID, PLASMA: Lactic Acid, Venous: 2.2 mmol/L (ref 0.5–1.9)

## 2020-10-16 LAB — PROCALCITONIN: Procalcitonin: 3.01 ng/mL

## 2020-10-16 LAB — CK: Total CK: 446 U/L — ABNORMAL HIGH (ref 49–397)

## 2020-10-16 MED ORDER — PROPOFOL 1000 MG/100ML IV EMUL
5.0000 ug/kg/min | INTRAVENOUS | Status: DC
  Administered 2020-10-16: 10 ug/kg/min via INTRAVENOUS
  Administered 2020-10-16: 50 ug/kg/min via INTRAVENOUS
  Administered 2020-10-17: 25 ug/kg/min via INTRAVENOUS
  Administered 2020-10-17 (×2): 30 ug/kg/min via INTRAVENOUS
  Administered 2020-10-18: 25 ug/kg/min via INTRAVENOUS
  Filled 2020-10-16 (×5): qty 100

## 2020-10-16 MED ORDER — FENTANYL CITRATE (PF) 100 MCG/2ML IJ SOLN
INTRAMUSCULAR | Status: AC
  Administered 2020-10-16: 100 ug via INTRAVENOUS
  Filled 2020-10-16: qty 2

## 2020-10-16 MED ORDER — VANCOMYCIN VARIABLE DOSE PER UNSTABLE RENAL FUNCTION (PHARMACIST DOSING): Status: DC

## 2020-10-16 MED ORDER — ALTEPLASE (STROKE) FULL DOSE INFUSION
0.9000 mg/kg | Freq: Once | INTRAVENOUS | Status: AC
  Administered 2020-10-16: 51.5 mg via INTRAVENOUS
  Filled 2020-10-16: qty 100

## 2020-10-16 MED ORDER — DOCUSATE SODIUM 100 MG PO CAPS: 100.0000 mg | ORAL_CAPSULE | Freq: Two times a day (BID) | ORAL | Status: DC | PRN

## 2020-10-16 MED ORDER — CHLORHEXIDINE GLUCONATE CLOTH 2 % EX PADS
6.0000 | MEDICATED_PAD | Freq: Every day | CUTANEOUS | Status: DC
  Administered 2020-10-17 – 2020-10-24 (×8): 6 via TOPICAL

## 2020-10-16 MED ORDER — ACETAMINOPHEN 325 MG PO TABS: 650.0000 mg | ORAL_TABLET | ORAL | Status: DC | PRN

## 2020-10-16 MED ORDER — LORAZEPAM 2 MG/ML IJ SOLN: 2.0000 mg | Freq: Once | INTRAMUSCULAR | Status: AC

## 2020-10-16 MED ORDER — CLEVIDIPINE BUTYRATE 0.5 MG/ML IV EMUL: 0.0000 mg/h | INTRAVENOUS | Status: DC

## 2020-10-16 MED ORDER — SODIUM CHLORIDE 0.9 % IV SOLN: 50.0000 mL | Freq: Once | INTRAVENOUS | Status: DC

## 2020-10-16 MED ORDER — POLYETHYLENE GLYCOL 3350 17 G PO PACK: 17.0000 g | PACK | Freq: Every day | ORAL | Status: DC | PRN

## 2020-10-16 MED ORDER — ACETAMINOPHEN 650 MG RE SUPP
650.0000 mg | Freq: Once | RECTAL | Status: AC
  Administered 2020-10-16: 650 mg via RECTAL
  Filled 2020-10-16: qty 1

## 2020-10-16 MED ORDER — FENTANYL 2500MCG IN NS 250ML (10MCG/ML) PREMIX INFUSION
50.0000 ug/h | INTRAVENOUS | Status: DC
  Administered 2020-10-16: 50 ug/h via INTRAVENOUS
  Administered 2020-10-17 – 2020-10-18 (×2): 100 ug/h via INTRAVENOUS
  Filled 2020-10-16 (×3): qty 250

## 2020-10-16 MED ORDER — THIAMINE HCL 100 MG/ML IJ SOLN
100.0000 mg | Freq: Every day | INTRAMUSCULAR | Status: DC
  Administered 2020-10-17 – 2020-10-21 (×5): 100 mg via INTRAVENOUS
  Filled 2020-10-16 (×5): qty 2

## 2020-10-16 MED ORDER — DEXTROSE 5 % IV SOLN: 10.0000 mg/kg | Freq: Once | INTRAVENOUS | Status: DC

## 2020-10-16 MED ORDER — PANTOPRAZOLE SODIUM 40 MG IV SOLR
40.0000 mg | Freq: Every day | INTRAVENOUS | Status: DC
  Administered 2020-10-16 – 2020-10-18 (×3): 40 mg via INTRAVENOUS
  Filled 2020-10-16 (×3): qty 40

## 2020-10-16 MED ORDER — FENTANYL BOLUS VIA INFUSION
50.0000 ug | INTRAVENOUS | Status: DC | PRN
Start: 2020-10-16 — End: 2020-10-19
  Administered 2020-10-18: 50 ug via INTRAVENOUS
  Administered 2020-10-18: 100 ug via INTRAVENOUS
  Administered 2020-10-18: 50 ug via INTRAVENOUS
  Filled 2020-10-16: qty 100

## 2020-10-16 MED ORDER — ROCURONIUM BROMIDE 50 MG/5ML IV SOLN
60.0000 mg | Freq: Once | INTRAVENOUS | Status: AC
  Administered 2020-10-16: 60 mg via INTRAVENOUS
  Filled 2020-10-16: qty 6

## 2020-10-16 MED ORDER — LACTATED RINGERS IV BOLUS: 1000.0000 mL | Freq: Once | INTRAVENOUS | Status: DC

## 2020-10-16 MED ORDER — LORAZEPAM 2 MG/ML IJ SOLN
INTRAMUSCULAR | Status: AC | PRN
  Administered 2020-10-16 (×2): 1 mg via INTRAVENOUS

## 2020-10-16 MED ORDER — IOHEXOL 350 MG/ML SOLN
50.0000 mL | Freq: Once | INTRAVENOUS | Status: AC | PRN
  Administered 2020-10-16: 50 mL via INTRAVENOUS

## 2020-10-16 MED ORDER — STROKE: EARLY STAGES OF RECOVERY BOOK
Freq: Once | Status: AC
  Filled 2020-10-16: qty 1

## 2020-10-16 MED ORDER — ACETAMINOPHEN 650 MG RE SUPP
650.0000 mg | RECTAL | Status: DC | PRN
Start: 2020-10-16 — End: 2020-10-25

## 2020-10-16 MED ORDER — ACETAMINOPHEN 160 MG/5ML PO SOLN
650.0000 mg | ORAL | Status: DC | PRN
  Administered 2020-10-17 – 2020-10-18 (×3): 650 mg
  Filled 2020-10-16 (×3): qty 20.3

## 2020-10-16 MED ORDER — FOLIC ACID 5 MG/ML IJ SOLN
1.0000 mg | Freq: Every day | INTRAMUSCULAR | Status: DC
  Administered 2020-10-16 – 2020-10-20 (×5): 1 mg via INTRAVENOUS
  Filled 2020-10-16 (×7): qty 0.2

## 2020-10-16 MED ORDER — MAGNESIUM SULFATE 2 GM/50ML IV SOLN
2.0000 g | Freq: Once | INTRAVENOUS | Status: AC
  Administered 2020-10-16: 2 g via INTRAVENOUS
  Filled 2020-10-16: qty 50

## 2020-10-16 MED ORDER — LEVETIRACETAM IN NACL 500 MG/100ML IV SOLN
500.0000 mg | Freq: Two times a day (BID) | INTRAVENOUS | Status: DC
  Administered 2020-10-17 – 2020-10-22 (×11): 500 mg via INTRAVENOUS
  Filled 2020-10-16 (×12): qty 100

## 2020-10-16 MED ORDER — FENTANYL CITRATE (PF) 100 MCG/2ML IJ SOLN
50.0000 ug | INTRAMUSCULAR | Status: DC | PRN
Start: 2020-10-16 — End: 2020-10-16

## 2020-10-16 MED ORDER — LORAZEPAM 2 MG/ML IJ SOLN
INTRAMUSCULAR | Status: AC
  Filled 2020-10-16: qty 1

## 2020-10-16 MED ORDER — SODIUM CHLORIDE 0.9 % IV SOLN
3000.0000 mg | Freq: Once | INTRAVENOUS | Status: AC
  Administered 2020-10-16: 3000 mg via INTRAVENOUS
  Filled 2020-10-16: qty 30

## 2020-10-16 MED ORDER — SODIUM CHLORIDE 0.9 % IV SOLN: 2.0000 g | Freq: Once | INTRAVENOUS | Status: DC

## 2020-10-16 MED ORDER — SODIUM CHLORIDE 0.9% FLUSH
3.0000 mL | Freq: Once | INTRAVENOUS | Status: AC
  Administered 2020-10-16: 3 mL via INTRAVENOUS

## 2020-10-16 MED ORDER — LACTATED RINGERS IV BOLUS
1000.0000 mL | Freq: Once | INTRAVENOUS | Status: AC
  Administered 2020-10-16: 1000 mL via INTRAVENOUS

## 2020-10-16 MED ORDER — SODIUM CHLORIDE 0.9 % IV SOLN
2.0000 g | Freq: Two times a day (BID) | INTRAVENOUS | Status: DC
  Administered 2020-10-16 – 2020-10-19 (×7): 2 g via INTRAVENOUS
  Filled 2020-10-16 (×6): qty 20

## 2020-10-16 MED ORDER — LORAZEPAM 2 MG/ML IJ SOLN: 2.0000 mg | Freq: Once | INTRAMUSCULAR | Status: DC

## 2020-10-16 MED ORDER — INSULIN ASPART 100 UNIT/ML IJ SOLN
0.0000 [IU] | INTRAMUSCULAR | Status: DC
  Administered 2020-10-16: 1 [IU] via SUBCUTANEOUS
  Administered 2020-10-18: 5 [IU] via SUBCUTANEOUS
  Administered 2020-10-18 (×2): 2 [IU] via SUBCUTANEOUS
  Administered 2020-10-19 (×2): 1 [IU] via SUBCUTANEOUS
  Administered 2020-10-19 – 2020-10-20 (×2): 2 [IU] via SUBCUTANEOUS
  Administered 2020-10-20 – 2020-10-21 (×3): 1 [IU] via SUBCUTANEOUS
  Administered 2020-10-22 (×2): 2 [IU] via SUBCUTANEOUS
  Administered 2020-10-23: 1 [IU] via SUBCUTANEOUS

## 2020-10-16 MED ORDER — LORAZEPAM 2 MG/ML IJ SOLN
INTRAMUSCULAR | Status: AC
  Administered 2020-10-16: 2 mg via INTRAVENOUS
  Filled 2020-10-16: qty 1

## 2020-10-16 MED ORDER — FENTANYL CITRATE (PF) 100 MCG/2ML IJ SOLN: 50.0000 ug | INTRAMUSCULAR | Status: DC | PRN

## 2020-10-16 MED ORDER — SODIUM CHLORIDE 0.9 % IV BOLUS
500.0000 mL | Freq: Once | INTRAVENOUS | Status: AC
  Administered 2020-10-16: 500 mL via INTRAVENOUS

## 2020-10-16 MED ORDER — SENNOSIDES-DOCUSATE SODIUM 8.6-50 MG PO TABS: 1.0000 | ORAL_TABLET | Freq: Every evening | ORAL | Status: DC | PRN

## 2020-10-16 MED ORDER — FENTANYL CITRATE (PF) 100 MCG/2ML IJ SOLN: 100.0000 ug | Freq: Once | INTRAMUSCULAR | Status: AC

## 2020-10-16 MED ORDER — ETOMIDATE 2 MG/ML IV SOLN
20.0000 mg | Freq: Once | INTRAVENOUS | Status: AC
  Administered 2020-10-16: 20 mg via INTRAVENOUS

## 2020-10-16 MED ORDER — VANCOMYCIN HCL 1000 MG/200ML IV SOLN: 1000.0000 mg | Freq: Once | INTRAVENOUS | Status: DC

## 2020-10-16 MED ORDER — FENTANYL CITRATE (PF) 100 MCG/2ML IJ SOLN: 50.0000 ug | Freq: Once | INTRAMUSCULAR | Status: DC

## 2020-10-16 MED ORDER — VANCOMYCIN HCL 1500 MG/300ML IV SOLN
1500.0000 mg | Freq: Once | INTRAVENOUS | Status: DC
  Filled 2020-10-16: qty 300

## 2020-10-16 NOTE — ED Notes (Addendum)
1542 Pt moved off ct scan

## 2020-10-16 NOTE — Progress Notes (Signed)
Stat EEG negative for seizures. We will continue LTM EEG.  -- Milon Dikes, MD Neurologist [facility] Pager: [phone]

## 2020-10-16 NOTE — Progress Notes (Signed)
Elink followed up and blood cultures have been obtained as well as Lactic, ending sepsis protocol from elink standpoint at this time

## 2020-10-16 NOTE — ED Notes (Signed)
Pt noted with blood in his mouth Dr Wilford Corner aware and at pt bedside

## 2020-10-16 NOTE — ED Notes (Signed)
Pt with moderated tremors and seizure like activity

## 2020-10-16 NOTE — ED Notes (Signed)
Pt to ED room 17

## 2020-10-16 NOTE — Progress Notes (Signed)
Routine EEG completed,Overnight EEG  in Progress, Result pending.

## 2020-10-16 NOTE — ED Provider Notes (Signed)
[facility] EMERGENCY DEPARTMENT Provider Note   CSN: [id number] Arrival date & time: 10/16/20  1501     History No chief complaint on file.   CULLEY HEDEEN is a 60 y.o. male.  HPI Presents with signs/sx of a stroke.  EMS gives report as the patient is significantly aphasic.  EMS states that the symptoms started at 2:15 PM.  They report that the family was with him and stated he had facial droop and difficulty with speech.  Chart review shows a similar instance previously and the patient was diagnosed with stroke. Level 5 caveat applies.  Blood glucose in the 200s    Past Medical History:  Diagnosis Date  . ETOH abuse   . Hypertension     Patient Active Problem List   Diagnosis Date Noted  . Acute encephalopathy 10/16/2020  . Toxic metabolic encephalopathy 20/94/7096  . Transaminitis   . Essential hypertension 10/08/2019  . Cerebrovascular accident (CVA) ([place]) 10/02/2019    Past Surgical History:  Procedure Laterality Date  . HEMORRHOID SURGERY         Family History  Problem Relation Age of Onset  . Diabetes Mother   . Hypertension Mother   . Diabetes Father   . Hypertension Father   . Hypertension Sister     Social History   Tobacco Use  . Smoking status: Former Smoker    Years: 20.00    Types: Cigarettes, Cigars    Quit date: 09/30/2019    Years since quitting: 1.0  . Smokeless tobacco: Never Used  . Tobacco comment: currently smokes 2 cigars/day  Substance Use Topics  . Alcohol use: Yes  . Drug use: Never    Home Medications Prior to Admission medications   Medication Sig Start Date End Date Taking? Authorizing Provider  amLODipine (NORVASC) 5 MG tablet TAKE 2 TABLETS (10 MG TOTAL) BY MOUTH DAILY. 11/19/19 11/18/20 Yes [name], Elhamalsadat, MD  aspirin EC 81 MG tablet Take 81 mg by mouth daily. Swallow whole.   Yes [provider]  atorvastatin (LIPITOR) 40 MG tablet TAKE 2 TABLETS (80 MG TOTAL) BY MOUTH DAILY. 11/19/19  11/18/20 Yes [name], Elhamalsadat, MD  lisinopril (ZESTRIL) 40 MG tablet TAKE 1 TABLET (40 MG TOTAL) BY MOUTH DAILY. 04/23/20 04/23/21 Yes Asencion Noble, MD  amLODipine (NORVASC) 10 MG tablet Take 1 tablet (10 mg total) by mouth daily. Patient not taking: Reported on 08/14/2020 11/19/19 02/17/20  Dewayne Hatch, MD  atorvastatin (LIPITOR) 80 MG tablet Take 1 tablet (80 mg total) by mouth daily. 11/19/19 02/17/20  [name], [name], MD  folic acid (FOLVITE) 1 MG tablet Take 1 tablet (1 mg total) by mouth daily. Patient not taking: No sig reported 10/05/19   Earlene Plater, MD  thiamine 100 MG tablet Take 1 tablet (100 mg total) by mouth daily. Patient not taking: No sig reported 10/05/19   Earlene Plater, MD    Allergies    Penicillins  Review of Systems   Review of Systems  Unable to perform ROS: Mental status change    Physical Exam Updated Vital Signs BP 128/63   Pulse 74   Temp 100.04 F (37.8 C)   Resp 16   Ht _0  (1.549 m)   Wt 57 kg   SpO2 100%   BMI 23.74 kg/m   Physical Exam Vitals and nursing note reviewed.  Constitutional:      Appearance: Normal appearance. He is well-developed.  HENT:     Head: Normocephalic and atraumatic.  Eyes:     Extraocular Movements: Extraocular movements intact.     Conjunctiva/sclera: Conjunctivae normal.  Cardiovascular:     Rate and Rhythm: Regular rhythm. Tachycardia present.     Heart sounds: No murmur heard.   Pulmonary:     Effort: Pulmonary effort is normal. No respiratory distress.     Breath sounds: Normal breath sounds.  Abdominal:     Palpations: Abdomen is soft.     Tenderness: There is no abdominal tenderness.  Musculoskeletal:     Cervical back: Normal range of motion and neck supple.     Right lower leg: No edema.     Left lower leg: No edema.  Skin:    General: Skin is warm and dry.  Neurological:     Mental Status: He is alert.     Comments:   MENTAL STATUS: Generally holds attention,  some extinction with commands etc   LANG/SPEECH: Receptive and Expressive aphasia present   CRANIAL NERVES:   II: Pupils equal and reactive   III, IV, VI: EOM intact, no gaze preference or deviation   V: normal   VII: no facial asymmetry   VIII: appears to have normal hearing to speech   MOTOR: no drift in any extremity   SENSORY: UTA   COORD: UTA FNF  Psychiatric:     Comments: UTA     ED Results / Procedures / Treatments   Labs (all labs ordered are listed, but only abnormal results are displayed) Labs Reviewed  PROTIME-INR - Abnormal; Notable for the following components:      Result Value   Prothrombin Time 15.3 (*)    All other components within normal limits  CBC - Abnormal; Notable for the following components:   Hemoglobin 12.5 (*)    HCT 37.1 (*)    MCV 78.9 (*)    RDW 16.4 (*)    Platelets 107 (*)    All other components within normal limits  COMPREHENSIVE METABOLIC PANEL - Abnormal; Notable for the following components:   Sodium 129 (*)    CO2 16 (*)    Glucose, Bld 188 (*)    BUN 33 (*)    Creatinine, Ser 3.31 (*)    Calcium 7.7 (*)    Albumin 2.7 (*)    AST 1,350 (*)    ALT 405 (*)    Alkaline Phosphatase 420 (*)    Total Bilirubin 3.2 (*)    GFR, Estimated 21 (*)    All other components within normal limits  AMMONIA - Abnormal; Notable for the following components:   Ammonia 70 (*)    All other components within normal limits  CK - Abnormal; Notable for the following components:   Total CK 446 (*)    All other components within normal limits  LACTIC ACID, PLASMA - Abnormal; Notable for the following components:   Lactic Acid, Venous 2.2 (*)    All other components within normal limits  SALICYLATE LEVEL - Abnormal; Notable for the following components:   Salicylate Lvl <9.5 (*)    All other components within normal limits  MAGNESIUM - Abnormal; Notable for the following components:   Magnesium 1.6 (*)    All other components within normal limits   LIPASE, BLOOD - Abnormal; Notable for the following components:   Lipase 145 (*)    All other components within normal limits  HEMOGLOBIN A1C - Abnormal; Notable for the following components:   Hgb A1c MFr Bld 6.8 (*)  All other components within normal limits  CBC - Abnormal; Notable for the following components:   RBC 3.97 (*)    Hemoglobin 10.6 (*)    HCT 30.7 (*)    MCV 77.3 (*)    RDW 17.2 (*)    Platelets 74 (*)    All other components within normal limits  COMPREHENSIVE METABOLIC PANEL - Abnormal; Notable for the following components:   Sodium 134 (*)    CO2 19 (*)    BUN 29 (*)    Creatinine, Ser 3.43 (*)    Calcium 7.2 (*)    Total Protein 5.9 (*)    Albumin 2.2 (*)    AST 1,035 (*)    ALT 309 (*)    Alkaline Phosphatase 345 (*)    Total Bilirubin 2.2 (*)    GFR, Estimated 20 (*)    All other components within normal limits  PROTIME-INR - Abnormal; Notable for the following components:   Prothrombin Time 17.1 (*)    INR 1.4 (*)    All other components within normal limits  LIPID PANEL - Abnormal; Notable for the following components:   Cholesterol 217 (*)    Triglycerides 480 (*)    HDL <10 (*)    All other components within normal limits  GLUCOSE, CAPILLARY - Abnormal; Notable for the following components:   Glucose-Capillary 128 (*)    All other components within normal limits  GLUCOSE, CAPILLARY - Abnormal; Notable for the following components:   Glucose-Capillary 100 (*)    All other components within normal limits  LDL CHOLESTEROL, DIRECT - Abnormal; Notable for the following components:   Direct LDL 124.3 (*)    All other components within normal limits  MAGNESIUM - Abnormal; Notable for the following components:   Magnesium 2.7 (*)    All other components within normal limits  GLUCOSE, CAPILLARY - Abnormal; Notable for the following components:   Glucose-Capillary 106 (*)    All other components within normal limits  I-STAT CHEM 8, ED -  Abnormal; Notable for the following components:   Sodium 132 (*)    BUN 37 (*)    Creatinine, Ser 3.50 (*)    Glucose, Bld 181 (*)    Calcium, Ion 0.96 (*)    TCO2 21 (*)    All other components within normal limits  CBG MONITORING, ED - Abnormal; Notable for the following components:   Glucose-Capillary 110 (*)    All other components within normal limits  I-STAT ARTERIAL BLOOD GAS, ED - Abnormal; Notable for the following components:   pH, Arterial 7.284 (*)    pO2, Arterial 261 (*)    Acid-base deficit 4.0 (*)    Calcium, Ion 0.97 (*)    All other components within normal limits  I-STAT ARTERIAL BLOOD GAS, ED - Abnormal; Notable for the following components:   pH, Arterial 7.313 (*)    Acid-base deficit 4.0 (*)    Calcium, Ion 1.01 (*)    HCT 38.0 (*)    Hemoglobin 12.9 (*)    All other components within normal limits  TROPONIN I (HIGH SENSITIVITY) - Abnormal; Notable for the following components:   Troponin I (High Sensitivity) 140 (*)    All other components within normal limits  TROPONIN I (HIGH SENSITIVITY) - Abnormal; Notable for the following components:   Troponin I (High Sensitivity) 134 (*)    All other components within normal limits  RESP PANEL BY RT-PCR (FLU A&B, COVID) ARPGX2  MRSA PCR  SCREENING  CULTURE, BLOOD (ROUTINE X 2)  CULTURE, BLOOD (SINGLE)  APTT  DIFFERENTIAL  PROCALCITONIN  ETHANOL  ACETAMINOPHEN LEVEL  HIV ANTIBODY (ROUTINE TESTING W REFLEX)  PHOSPHORUS  PROCALCITONIN  LACTIC ACID, PLASMA  HEPATITIS PANEL, ACUTE  GLUCOSE, CAPILLARY  VANCOMYCIN, RANDOM  GLUCOSE, CAPILLARY  SODIUM, URINE, RANDOM  CREATININE, URINE, RANDOM  RAPID URINE DRUG SCREEN, HOSP PERFORMED  BLOOD GAS, ARTERIAL  DRUG PROFILE, UR, 9 DRUGS ([facility])  BLOOD GAS, ARTERIAL  MAGNESIUM  PHOSPHORUS    EKG EKG Interpretation  Date/Time:  Sunday Oct 16 2020 16:37:10 EDT Ventricular Rate:  149 PR Interval:  143 QRS Duration: 73 QT Interval:  258 QTC  Calculation: 407 R Axis:   69 Text Interpretation: Sinus tachycardia LAE, consider biatrial enlargement Abnormal R-wave progression, early transition Nonspecific T abnormalities, inferior leads rate is significantlyfaster compared to Mar 2022 Confirmed by Sherwood Gambler [phone]) on 10/16/2020 5:23:31 PM   Radiology CT Head Wo Contrast  Result Date: 10/16/2020 CLINICAL DATA:  Evaluate for intracranial hemorrhage after administration of tPA in a patient with history of stroke. EXAM: CT HEAD WITHOUT CONTRAST TECHNIQUE: Contiguous axial images were obtained from the base of the skull through the vertex without intravenous contrast. COMPARISON:  None. FINDINGS: Brain: No evidence of acute infarction, hemorrhage, hydrocephalus, extra-axial collection or mass lesion/mass effect. Signs of prior infarct in the LEFT occipital region and RIGHT corona radiata, atrophy and chronic microvascular ischemic change without change. Vascular: No hyperdense vessel or unexpected calcification. Skull: Normal. Negative for fracture or focal lesion. Sinuses/Orbits: Sinus disease as on the recent comparison study. Other: None. IMPRESSION: 1. No acute intracranial abnormality. 2. Signs of prior infarct in the LEFT occipital region and RIGHT basal ganglia, atrophy and chronic microvascular ischemic change without change. 3. Sinus disease as on the recent comparison study. Electronically Signed   By: Zetta Bills M.D.   On: 10/16/2020 17:36   US Abdomen Complete  Result Date: 10/16/2020 CLINICAL DATA:  Transaminitis EXAM: ABDOMEN ULTRASOUND COMPLETE COMPARISON:  None. FINDINGS: Gallbladder: No gallstones or wall thickening visualized. No sonographic Murphy sign noted by sonographer. Common bile duct: Diameter: 4 mm in proximal diameter Liver: The liver parenchyma is diffusely echogenic, in keeping with changes of mild hepatic steatosis, with focal fatty sparing noted within the gallbladder fossa. No focal intrahepatic masses are  seen. There is no intrahepatic biliary ductal dilation. Portal vein is patent on color Doppler imaging with normal direction of blood flow towards the liver. IVC: No abnormality visualized. Pancreas: Visualized portion unremarkable. Spleen: Size and appearance within normal limits. Right Kidney: Length: 10.6 cm. Echogenicity within normal limits. No mass or hydronephrosis visualized. Left Kidney: Length: 8.9 cm. Echogenicity within normal limits. No mass or hydronephrosis visualized. Abdominal aorta: No aneurysm visualized. Atherosclerotic calcification is seen within the abdominal aorta. Other findings: None. IMPRESSION: Mild hepatic steatosis. Aortic Atherosclerosis (ICD10-I70.0). Electronically Signed   By: Fidela Salisbury MD   On: 10/16/2020 22:46   DG Chest Portable 1 View  Result Date: 10/16/2020 CLINICAL DATA:  Code stroke.  Intubation. EXAM: PORTABLE CHEST 1 VIEW COMPARISON:  07/22/2008 chest radiograph FINDINGS: An endotracheal tube with tip 2.5 cm above the carina and NG tube entering the stomach noted. The cardiomediastinal silhouette is unremarkable. There is no evidence of focal airspace disease, pulmonary edema, suspicious pulmonary nodule/mass, pleural effusion, or pneumothorax. Fractures of the LATERAL RIGHT 6th, 7th and 8th ribs are new since 07/22/2008 but have a subacute appearance. Correlate clinically. IMPRESSION: 1. Endotracheal tube and NG tube  as described.  Clear lungs. 2. RIGHT LATERAL 6th through 8th ribs of uncertain chronicity. Correlate clinically. Electronically Signed   By: Margarette Canada M.D.   On: 10/16/2020 16:54   EEG adult  Result Date: 10/16/2020 Lora Havens, MD     10/17/2020  9:18 AM Patient Name: MAVIS FICHERA MRN: [id number] Epilepsy Attending: Lora Havens Referring Physician/Provider: Dr Amie [place] Date: 10/16/2020 Duration: 42.27 mins Patient history: 60 year old with prior stroke with residual minimal speech deficit, hypertension, alcohol abuse presenting for  evaluation of sudden onset of what EMS described as left facial weakness and word finding difficulty. EEG to evaluate for seizure. Level of alertness:  comatose AEDs during EEG study: LEV, Propofol Technical aspects: This EEG study was done with scalp electrodes positioned according to the 10-20 International system of electrode placement. Electrical activity was acquired at a sampling rate of _0  and reviewed with a high frequency filter of _1  and a low frequency filter of _2 . EEG data were recorded continuously and digitally stored. Description:  EEG showed continuous generalized 3 to 6 Hz theta-delta slowing admixed with 15 to 18 Hz beta activity distributed symmetrically and diffusely.  Hyperventilation and photic stimulation were not performed.   P7 electrode artifact was noted during the study. ABNORMALITY - Continuous slow, generalized IMPRESSION: This study is suggestive of severe diffuse encephalopathy, nonspecific etiology but likely related to sedation.  No seizures or epileptiform discharges were seen during the study [place]  Result Date: 10/16/2020 CLINICAL DATA:  Code stroke.  Acute neuro deficit.  Slurred speech EXAM: CT HEAD WITHOUT CONTRAST TECHNIQUE: Contiguous axial images were obtained from the base of the skull through the vertex without intravenous contrast. COMPARISON:  CT head 08/14/2020 FINDINGS: Brain: Generalized atrophy. Chronic microvascular ischemic change in the white matter. Chronic infarct in the right corona radiata unchanged. Chronic infarct in the left parietal lobe unchanged from the prior study. Chronic infarcts in the thalamus bilaterally. Negative for acute infarct, hemorrhage, mass Vascular: Negative for hyperdense vessel Skull: Negative Sinuses/Orbits: Mild mucosal edema paranasal sinuses. Negative orbit Other: None ASPECTS ([place] Stroke Program Early CT Score) - Ganglionic level infarction (caudate, lentiform nuclei,  internal capsule, insula, M1-M3 cortex): 7 - Supraganglionic infarction (M4-M6 cortex): 3 Total score (0-10 with 10 being normal): 10 IMPRESSION: 1. No acute abnormality 2. Moderate chronic ischemic changes 3. ASPECTS is 10 4. Code stroke imaging results were communicated on 10/16/2020 at 3:24 pm to provider Rory Percy via text page Electronically Signed   By: Franchot Gallo M.D.   On: 10/16/2020 15:25   CT ANGIO HEAD NECK W WO CM W PERF (CODE STROKE)  Result Date: 10/16/2020 CLINICAL DATA:  Stroke.  Slurred speech. EXAM: CT ANGIOGRAPHY HEAD AND NECK TECHNIQUE: Multidetector CT imaging of the head and neck was performed using the standard protocol during bolus administration of intravenous contrast. Multiplanar CT image reconstructions and MIPs were obtained to evaluate the vascular anatomy. Carotid stenosis measurements (when applicable) are obtained utilizing NASCET criteria, using the distal internal carotid diameter as the denominator. CONTRAST:  87m OMNIPAQUE IOHEXOL 350 MG/ML SOLN COMPARISON:  CT head 10/16/2020 FINDINGS: CTA NECK FINDINGS Aortic arch: Standard branching. Imaged portion shows no evidence of aneurysm or dissection. No significant stenosis of the major arch vessel origins. Atherosclerotic aortic arch. Right carotid system: Atherosclerotic disease proximal right internal carotid artery without significant stenosis. Left carotid system: Moderate atherosclerotic disease left carotid bifurcation and proximal left internal carotid artery.  Approximately 40% diameter stenosis left internal carotid artery due to calcific plaque. Vertebral arteries: Right vertebral artery dominant. Right vertebral artery patent to the basilar without stenosis. Left vertebral artery ends in PICA. Skeleton: Cervical spondylosis.  No acute skeletal abnormality. Other neck: Negative for mass or edema in the neck. Upper chest: Lung apices clear bilaterally. Review of the MIP images confirms the above findings CTA HEAD FINDINGS  Anterior circulation: Internal carotid artery widely patent bilaterally. Anterior and middle cerebral arteries patent without large vessel occlusion. There is atherosclerotic irregularity in anterior and middle cerebral artery branches bilaterally. This is most prominent in the left MCA branches. Posterior circulation: Right vertebral artery supplies the basilar. The right PICA not visualized. Small right AICA patent. Left vertebral artery is small and ends in PICA. Basilar widely patent. Superior cerebellar and posterior cerebral arteries patent bilaterally. Fetal origin of the posterior cerebral artery bilaterally. Venous sinuses: Normal venous enhancement Anatomic variants: None Review of the MIP images confirms the above findings IMPRESSION: 1. Negative for intracranial large vessel occlusion 2. Intracranial atherosclerotic disease with vessel irregularity throughout the anterior and middle cerebral arteries bilaterally. This is most prominent in left MCA branches. 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 3. 40% diameter stenosis left internal carotid artery due to calcific plaque. Electronically Signed   By: Franchot Gallo M.D.   On: 10/16/2020 16:02    Procedures Procedure Name: Intubation Date/Time: 10/17/2020 3:04 PM Performed by: Aris Lot, MD Pre-anesthesia Checklist: Suction available Oxygen Delivery Method: Non-rebreather mask Preoxygenation: Pre-oxygenation with 100% oxygen Induction Type: Rapid sequence Laryngoscope Size: Mac Grade View: Grade I Tube size: 7.5 mm Number of attempts: 1 Airway Equipment and Method: Stylet and Video-laryngoscopy Placement Confirmation: ETT inserted through vocal cords under direct vision Secured at: 25 cm Tube secured with: ETT holder Dental Injury: Teeth and Oropharynx as per pre-operative assessment         Medications Ordered in ED Medications  LORazepam (ATIVAN) 2 MG/ML injection (has no administration in time range)  propofol  (DIPRIVAN) 1000 MG/100ML infusion (30 mcg/kg/min  57.2 kg Intravenous New Bag/Given 10/17/20 1446)  thiamine (B-1) injection 100 mg (100 mg Intravenous Given 1/91/47 8295)  folic acid injection 1 mg (1 mg Intravenous Given 10/17/20 1044)  docusate sodium (COLACE) capsule 100 mg (has no administration in time range)  polyethylene glycol (MIRALAX / GLYCOLAX) packet 17 g (has no administration in time range)  pantoprazole (PROTONIX) injection 40 mg (40 mg Intravenous Given 10/16/20 2200)  insulin aspart (novoLOG) injection 0-9 Units (0 Units Subcutaneous Not Given 10/17/20 1215)  cefTRIAXone (ROCEPHIN) 2 g in sodium chloride 0.9 % 100 mL IVPB (0 g Intravenous Stopped 10/17/20 0953)   stroke: mapping our early stages of recovery book (has no administration in time range)  acetaminophen (TYLENOL) tablet 650 mg ( Oral See Alternative 10/17/20 1044)    Or  acetaminophen (TYLENOL) 160 MG/5ML solution 650 mg (650 mg Per Tube Given 10/17/20 1044)    Or  acetaminophen (TYLENOL) suppository 650 mg ( Rectal See Alternative 10/17/20 1044)  senna-docusate (Senokot-S) tablet 1 tablet (has no administration in time range)  levETIRAcetam (KEPPRA) IVPB 500 mg/100 mL premix (0 mg Intravenous Stopped 10/17/20 0524)  clevidipine (CLEVIPREX) infusion 0.5 mg/mL (has no administration in time range)  fentaNYL 2587mg in NS 2570m(1062mml) infusion-PREMIX (100 mcg/hr Intravenous Infusion Verify 10/17/20 1400)  fentaNYL (SUBLIMAZE) bolus via infusion 50-100 mcg (has no administration in time range)  Chlorhexidine Gluconate Cloth 2 % PADS 6 each (has no administration  in time range)  chlorhexidine gluconate (MEDLINE KIT) (PERIDEX) 0.12 % solution 15 mL (15 mLs Mouth Rinse Given 10/17/20 0805)  MEDLINE mouth rinse (15 mLs Mouth Rinse Given 10/17/20 1400)  lactated ringers infusion ( Intravenous Infusion Verify 10/17/20 1400)  lactulose (CHRONULAC) 10 GM/15ML solution 20 g (20 g Per Tube Given 10/17/20 1044)  linezolid (ZYVOX) IVPB  600 mg (0 mg Intravenous Stopped 10/17/20 1150)  feeding supplement (VITAL AF 1.2 CAL) liquid 1,000 mL (has no administration in time range)  phenytoin (DILANTIN) injection 100 mg (has no administration in time range)  sodium chloride flush (NS) 0.9 % injection 3 mL (3 mLs Intravenous Given 10/16/20 2004)  alteplase (ACTIVASE) 1 mg/mL infusion 51.5 mg (0 mg Intravenous Stopped 10/16/20 1542)  lactated ringers bolus 1,000 mL (0 mLs Intravenous Stopped 10/16/20 1700)  levETIRAcetam (KEPPRA) 3,000 mg in sodium chloride 0.9 % 250 mL IVPB (0 mg Intravenous Stopped 10/16/20 1629)  iohexol (OMNIPAQUE) 350 MG/ML injection 50 mL (50 mLs Intravenous Contrast Given 10/16/20 1548)  LORazepam (ATIVAN) injection 2 mg (2 mg Intravenous Given 10/16/20 1600)  LORazepam (ATIVAN) injection (1 mg Intravenous Given 10/16/20 1540)  etomidate (AMIDATE) injection 20 mg (20 mg Intravenous Given 10/16/20 1632)  rocuronium (ZEMURON) injection 60 mg (60 mg Intravenous Given 10/16/20 1633)  acetaminophen (TYLENOL) suppository 650 mg (650 mg Rectal Given 10/16/20 1824)  fentaNYL (SUBLIMAZE) injection 100 mcg (100 mcg Intravenous Given 10/16/20 1717)  sodium chloride 0.9 % bolus 500 mL ( Intravenous Stopped 10/17/20 0057)  magnesium sulfate IVPB 2 g 50 mL (0 g Intravenous Stopped 10/17/20 0050)  fosPHENYtoin (CEREBYX) 1,140 mg PE in sodium chloride 0.9 % 50 mL IVPB ( Intravenous Stopped 10/17/20 1017)    ED Course  I have reviewed the triage vital signs and the nursing notes.  Pertinent labs & imaging results that were available during my care of the patient were reviewed by me and considered in my medical decision making (see chart for details).    MDM Rules/Calculators/A&P                          Pt p/w signs/sx concerning for stroke. Family not present for further history.  NIHSS 5 on my eval. CT head does not show hemorrhage.  There is evidence of his previous infarct.  Patient was evaluated at bedside with neurologist.   Neurologist decided to give Ativan due to possibility of seizure on arrival initially decision made was for tPA, however it was noted that the patient had urinated himself and then was found to have some blood in his mouth concerning for seizure. tPA bolus given then aborted.  On repeat exam the patient had a mild amount of blood in his mouth, GCS of 10 which is notably affected by his aphasia, attempting to follow commands, and appears to attempt to answer some questions with physical answers.  For example, when asked "do you drink?"  He nods his head and says "a lot."  Patient tremulous.  There is concern for alcohol withdrawal leading to seizure so 2 more milligrams of Ativan were given. The patient had bleeding in his mouth and a gradually worsening mental status; decision was made to intubate. Successful RSI performed with etomidate, rocuronium. Labs came back and showed multiorgan dysfunction with elevated LFTs and AKI.  Patient also developed fever.  Given that he had received tPA, LP had been considered in the context of the fever but cannot be safely performed due to the  tPA.  Antibiotics were given.  Patient was treated for sepsis with resuscitative fluids.  He was admitted to the medical ICU for further work-up.   Final Clinical Impression(s) / ED Diagnoses Final diagnoses:  Endotracheally intubated  Transaminitis    Rx / DC Orders ED Discharge Orders    None       Aris Lot, MD 10/17/20 8682    Sherwood Gambler, MD 10/17/20 2358

## 2020-10-16 NOTE — H&P (Signed)
NAME:  Arthur [name], MRN:  [id number], DOB:  [birth year], LOS: 0 ADMISSION DATE:  10/16/2020, CONSULTATION DATE:  10/16/2020 REFERRING MD:  Dr. Verner Chol, CHIEF COMPLAINT:  AMS  History of Present Illness:  HPI obtained from medical chart review and per wife via phone.  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  He was able to follow some commands and moved all extremities on arrival.  Code stroke underway.  Initial CTH neg.   tPA was started then patient started to have tremulous movements and blood coming from mouth (noted to have cheek laceration) concerning for seizure activity, and given ETOH hx, tPA was therefore stopped.  Suspected to have possible ETOH withdrawal seizures.  He only received 5.1 mg of tPA at 1523 on 5/22.  CTA head/ neck neg for LVO.  Given declining mental status, he was intubated for airway protection and placed on propofol for sedation.  Per his wife via phone, he drinks "a lot" per wife, including beer and liquor, but only had one beer yesterday/ 5/21, none today.    Vitals noted 102.9, BP 177/123.  Labs significant for Na 132, glucose 181, BUN 37, sCr 3.50 (previously 1.35 in 07/2020), normal WBC, plt 107, normal coags, alk phos 420, AST/ ALT 1350/ 405, t.bili 32.  COVID and remaining labs pending.  CXR neg.  Ceftriaxone and vanc to be started after cultures obtained.  Seen by Neurology given initial code stroke.  PCCM called to admit.   Pertinent  Medical History  HTN, HLD, CVA (residual   Significant Hospital Events: Including procedures, antibiotic start and stop dates in addition to other pertinent events   . 5/22 admitted with acute encephalopathy, initially thought to be stroke, CTH neg, tPA bolus $RemoveB'5mg'VgeqtNyG$  given, then stopped after developing seizure activity.  Intubated for airway  protection/ declining mental status.  Febrile with AKI and Transaminitis.   Interim History / Subjective:  Returning from repeat [facility] Being placed back on cEEG  Objective   Blood pressure (!) 119/92, pulse (!) 131, temperature (!) 102.9 F (39.4 C), temperature source Axillary, resp. rate (!) 21, height $RemoveBe'5\' 1"'GDHQBYKFA$  (1.549 m), weight 57.2 kg, SpO2 99 %.    Vent Mode: PRVC FiO2 (%):  [60 %] 60 % Set Rate:  [18 bmp] 18 bmp Vt Set:  [420 mL] 420 mL PEEP:  [5 cmH20] 5 cmH20  No intake or output data in the 24 hours ending 10/16/20 1704 Filed Weights   10/16/20 1500  Weight: 57.2 kg    Examination: currently on propofol s/p fentanyl push General:  Critically ill thin adult male sedated/paralyzed, intubated on MV HEENT: MM pink/moist, ETT, some bloody oral secretions, no obvious laceration noted, pupils reactive/2 Neuro: sedated/ paralyzed CV: NSR PULM:  MV supported breaths, CTA GI: soft, ND Extremities: warm/dry, no LE edema  Skin: no rashes   Labs/imaging that I havepersonally reviewed  (right click and "Reselect all SmartList Selections" daily)  5/22 CTH >> neg 5/22 CTA head/ neck >> neg for LVO 5/22 CTH post tPA/ seizure >>   5/22 cEEG >>  CBC, CMET, coags  Resolved Hospital Problem list    Assessment & Plan:   Acute encephalopathy- likely toxic/ metabolic  Concern for ETOH withdrawal seizures/ r/o SE R/o CNS process/ infection w/ fever Hx of prior  L MCA CVA 09/2019 with residual speech issues (no intervention given he presented out of window) ETOH abuse- last drink, 1 beer 5/21 per wife - initially presenting as acute CVA, initial CTH neg, tPA bolus (5.$RemoveBefo'1mg'WxRZctMxgOf$ ) given but stopped when patient developed seizure activity, with declining mental status/ postictal, thought to be possible ETOH withdrawal related seizure.  Can not rule out CNS process/ infection given fever P:  Neurology following, appreciate input Post tPA bolus (5.1 mg) CTH without changes SBP goal < 180 x 24hrs,  cleviprex if needed  MRI 5/22 ~1500 cEEG  AED per neurology- keppra s/p 3 gm Keppra load Maintain neuro protective measures; goal for eurothermia, euglycemia, eunatermia, normoxia, and PCO2 goal of 35-40 Nutrition and bowel regiment  Seizure precautions  Will likely need LP in 24hr post tPA dose to rule out CNS process.  Start on ceftriaxone and vanc, defer acyclovir/ ampicillin for now given AKI/ LFTs.  Empiric thiamine/ folate Pending UDS/ tylenol/ salicylate/ ETOH level/ ammonia/ CK/ trop hs/ EKG Remainder of stroke workup per Neurology, TTE, A1c, lipids ETOH abuse counseling when appropriate   Acute respiratory insufficieny related to above  ?possible aspiration P:  Continue MV support, 4-8cc/kg IBW with goal Pplat <30 and DP<15  VAP prevention protocol/ PPI PAD protocol for sedation> propofol / fentanyl gtt w/bowel regimen for RASS goal -1/-2 or for SE Wean FiO2 as able for SpO2 >92%  Daily SAT & SBT ABG pending   AKI Hyponatremia  - unclear etiology at this time, not hypotensive, could be related to ETOH/ ACEi at home vs hepatorenal P:  Insert foley Send UA, urine lytes, and check renal ultrasound Check mag/ phos No emergent needs for dialysis Consider nephrology consult if progressive  Trend BMP / urinary output Replace electrolytes as indicated Avoid nephrotoxic agents, ensure adequate renal perfusion   Transaminitis Thrombocytopenia - MELD score 24 with a 19.6% 3 month mortality rate; discriminant score 13.8 (defer steroids at this time) - suspected from chronic ETOH abuse vs hepatorenal syndrome - check RUQ ultrasound/ ammonia - hold on hepatitis panel   - trend coags / LFTs  - consider GI consult   Hyperglycemia without AG P:  Check A1c SSI sensitive   Fever without leukocytosis -source not obvious, CXR clear, ?aspiration not developed radiographically yet  P:  Need to rule out CNS process as above COVID pending Pan culture Empiric CTX and  vanc Check/ trend PCT Continue empiric airborne precautions  Hx HTN P:  Hold home norvasc and lisinopril in the setting of AKI and strict BP management SBP goal < 180 after tPA  cleviprex prn    Best practice (right click and "Reselect all SmartList Selections" daily)  Diet:  NPO Pain/Anxiety/Delirium protocol (if indicated): Yes (RASS goal -1,-2) VAP protocol (if indicated): Yes DVT prophylaxis: SCD GI prophylaxis: PPI Glucose control:  SSI Yes Central venous access:  N/A Arterial line:  N/A Foley:  N/A Mobility:  bed rest  PT consulted: N/A Last date of multidisciplinary goals of care discussion [5/22] Code Status:  full code Disposition: admit ICU  Labs   CBC: Recent Labs  Lab 10/16/20 1505 10/16/20 1508  WBC 8.1  --   NEUTROABS 6.8  --   HGB 12.5* 15.3  HCT 37.1* 45.0  MCV 78.9*  --   PLT 107*  --     Basic Metabolic Panel: Recent Labs  Lab 10/16/20 1505 10/16/20 1508  NA 129* 132*  K 4.6 4.7  CL 98 104  CO2 16*  --  GLUCOSE 188* 181*  BUN 33* 37*  CREATININE 3.31* 3.50*  CALCIUM 7.7*  --    GFR: Estimated Creatinine Clearance: 17.2 mL/min (A) (by C-G formula based on SCr of 3.5 mg/dL (H)). Recent Labs  Lab 10/16/20 1505  WBC 8.1    Liver Function Tests: Recent Labs  Lab 10/16/20 1505  AST 1,350*  ALT 405*  ALKPHOS 420*  BILITOT 3.2*  PROT 7.1  ALBUMIN 2.7*   No results for input(s): LIPASE, AMYLASE in the last 168 hours. No results for input(s): AMMONIA in the last 168 hours.  ABG    Component Value Date/Time   TCO2 21 (L) 10/16/2020 1508     Coagulation Profile: Recent Labs  Lab 10/16/20 1505  INR 1.2    Cardiac Enzymes: No results for input(s): CKTOTAL, CKMB, CKMBINDEX, TROPONINI in the last 168 hours.  HbA1C: Hgb A1c MFr Bld  Date/Time Value Ref Range Status  10/03/2019 02:22 AM 5.4 4.8 - 5.6 % Final    Comment:    (NOTE) Pre diabetes:          5.7%-6.4% Diabetes:              >6.4% Glycemic control for    <7.0% adults with diabetes     CBG: No results for input(s): GLUCAP in the last 168 hours.  Review of Systems:   Unable  Past Medical History:  He,  has a past medical history of ETOH abuse and Hypertension.   Surgical History:   Past Surgical History:  Procedure Laterality Date  . HEMORRHOID SURGERY       Social History:   reports that he quit smoking about 12 months ago. His smoking use included cigarettes and cigars. He quit after 20.00 years of use. He has never used smokeless tobacco. He reports current alcohol use. He reports that he does not use drugs.   Family History:  His family history includes Diabetes in his father and mother; Hypertension in his father, mother, and sister.   Allergies Allergies  Allergen Reactions  . Penicillins Hives     Home Medications  Prior to Admission medications   Medication Sig Start Date End Date Taking? Authorizing Provider  amLODipine (NORVASC) 10 MG tablet Take 1 tablet (10 mg total) by mouth daily. Patient not taking: Reported on 08/14/2020 11/19/19 02/17/20  Dewayne Hatch, MD  amLODipine (NORVASC) 5 MG tablet Take 10 mg by mouth daily. 05/24/20   [provider]  amLODipine (NORVASC) 5 MG tablet TAKE 2 TABLETS (10 MG TOTAL) BY MOUTH DAILY. 11/19/19 11/18/20  [name], [name], MD  aspirin EC 81 MG tablet Take 81 mg by mouth daily. Swallow whole.    [provider]  atorvastatin (LIPITOR) 40 MG tablet Take 80 mg by mouth daily. 05/24/20   [provider]  atorvastatin (LIPITOR) 40 MG tablet TAKE 2 TABLETS (80 MG TOTAL) BY MOUTH DAILY. 11/19/19 11/18/20  [name], [name], MD  atorvastatin (LIPITOR) 80 MG tablet Take 1 tablet (80 mg total) by mouth daily. Patient not taking: Reported on 08/14/2020 11/19/19 02/17/20  Dewayne Hatch, MD  folic acid (FOLVITE) 1 MG tablet Take 1 tablet (1 mg total) by mouth daily. Patient not taking: No sig reported 10/05/19   Earlene Plater, MD   lisinopril (ZESTRIL) 40 MG tablet TAKE 1 TABLET (40 MG TOTAL) BY MOUTH DAILY. 04/23/20 04/23/21  Asencion Noble, MD  thiamine 100 MG tablet Take 1 tablet (100 mg total) by mouth daily. Patient not taking: No sig reported 10/05/19  Earlene Plater, MD     Critical care time: 34 mins     Kennieth Rad, ACNP [facility] 10/16/2020, 6:11 PM

## 2020-10-16 NOTE — ED Notes (Signed)
Dr Wilford Corner made decision to stop TPA

## 2020-10-16 NOTE — ED Triage Notes (Addendum)
Pt arrived to ED via ems from home. Pt was sitting talking with family with they noted slurred speech , last seen normal 1415. Per EMS pt with slurred speech dysarthria and left sided facial droop. No other deficits noted during transport.  Upon arrival to ED pt with slurred speech and dysarthria, no facial droop noted or other deficits.

## 2020-10-16 NOTE — Consult Note (Addendum)
Neurology Consultation  Reason for Consult: Code stroke, aphasia, left facial droop Referring Physician: Dr. Pricilla Loveless  CC: Speech difficulty, aphasia  History is obtained from: EMS, chart  HPI: Arthur [name] is a 60 y.o. male past medical history of hypertension, alcohol abuse, prior left temporoparietal stroke with presumably no residual deficits in the chart but according to wife with some delayed speech, last known well at 2:15 PM today with sudden onset of facial droop-left per EMS, and slurred speech. Family noted that his speech is not making sense while he was talking to them and family members were there. Was brought in as a code stroke by EMS. On my evaluation, initially his NIH stroke scale was 5-2 for LOC questions, 2 for aphasia, 1 for dysarthria. I have seen this gentleman last year when he presented with his left parietotemporal stroke.  He was worked up and recommended neurological follow-up outpatient-but I do not see any follow-up notes. At the time, he had mild derangement in his liver function, normal renal function, stroke work-up revealing no clear cause but stroke thought to be likely embolic for which she was recommended to follow-up outpatient-no records. On my examination today he appears aphasic similar to last time where he had some word salad. No reported seizures by EMS. Decision made to use IV tPA after obtaining a noncontrast head CT which was unremarkable for any acute process, no bleed. As soon as tPA bolus was completed, he started shivering and there was blood noted in his mouth. I suspect seizures and I stop the tPA and brought him back to the room. He was given a total of 4 mg of Ativan in quick succession for presumed seizure versus alcohol withdrawal. Continues to be more somnolent and the shivering/tremulousness did not resolve. CTH repeated-no bleed. Also spiked a temperature of 102 Fahrenheit  LKW: 1415 hrs tpa given?:  Bolus given, stopped a  minute or so after bolus completion due to active seizing and blood from the mouth. Premorbid modified Rankin scale (mRS): 0  ROS: Unable to obtain due to altered mental status.   Past Medical History:  Diagnosis Date  . ETOH abuse   . Hypertension     Family History  Problem Relation Age of Onset  . Diabetes Mother   . Hypertension Mother   . Diabetes Father   . Hypertension Father   . Hypertension Sister    Social History:   reports that he quit smoking about 12 months ago. His smoking use included cigarettes and cigars. He quit after 20.00 years of use. He has never used smokeless tobacco. He reports current alcohol use. He reports that he does not use drugs.  Medications  Current Facility-Administered Medications:  .  alteplase (ACTIVASE) 1 mg/mL infusion 51.5 mg, 0.9 mg/kg, Intravenous, Once **FOLLOWED BY** 0.9 %  sodium chloride infusion, 50 mL, Intravenous, Once, Milon Dikes, MD .  etomidate (AMIDATE) injection 20 mg, 20 mg, Intravenous, Once, Pricilla Loveless, MD .  lactated ringers bolus 1,000 mL, 1,000 mL, Intravenous, Once, Jacklynn Bue, MD .  LORazepam (ATIVAN) 2 MG/ML injection, , , ,  .  LORazepam (ATIVAN) injection 2 mg, 2 mg, Intravenous, Once, Pricilla Loveless, MD .  LORazepam (ATIVAN) injection, , , PRN, Milon Dikes, MD, 1 mg at 10/16/20 1540 .  propofol (DIPRIVAN) 1000 MG/100ML infusion, 5-80 mcg/kg/min, Intravenous, Continuous, Criss Alvine, Scott, MD .  rocuronium [facility]) injection 60 mg, 60 mg, Intravenous, Once, Pricilla Loveless, MD .  sodium chloride flush (NS) 0.9 %  injection 3 mL, 3 mL, Intravenous, Once, Pricilla Loveless, MD  Current Outpatient Medications:  .  amLODipine (NORVASC) 10 MG tablet, Take 1 tablet (10 mg total) by mouth daily. (Patient not taking: Reported on 08/14/2020), Disp: 90 tablet, Rfl: 1 .  amLODipine (NORVASC) 5 MG tablet, Take 10 mg by mouth daily., Disp: , Rfl:  .  amLODipine (NORVASC) 5 MG tablet, TAKE 2 TABLETS (10 MG TOTAL)  BY MOUTH DAILY., Disp: 180 tablet, Rfl: 1 .  aspirin EC 81 MG tablet, Take 81 mg by mouth daily. Swallow whole., Disp: , Rfl:  .  atorvastatin (LIPITOR) 40 MG tablet, Take 80 mg by mouth daily., Disp: , Rfl:  .  atorvastatin (LIPITOR) 40 MG tablet, TAKE 2 TABLETS (80 MG TOTAL) BY MOUTH DAILY., Disp: 180 tablet, Rfl: 1 .  atorvastatin (LIPITOR) 80 MG tablet, Take 1 tablet (80 mg total) by mouth daily. (Patient not taking: Reported on 08/14/2020), Disp: 90 tablet, Rfl: 1 .  folic acid (FOLVITE) 1 MG tablet, Take 1 tablet (1 mg total) by mouth daily. (Patient not taking: No sig reported), Disp: 30 tablet, Rfl: 0 .  lisinopril (ZESTRIL) 40 MG tablet, TAKE 1 TABLET (40 MG TOTAL) BY MOUTH DAILY., Disp: 90 tablet, Rfl: 0 .  thiamine 100 MG tablet, Take 1 tablet (100 mg total) by mouth daily. (Patient not taking: No sig reported), Disp: 30 tablet, Rfl: 0   Exam: Current vital signs: BP (!) 119/92   Pulse (!) 131   Resp (!) 21   Wt 57.2 kg   SpO2 98%   BMI 22.34 kg/m  Vital signs in last 24 hours: Pulse Rate:  [123-134] 131 (05/22 1615) Resp:  [20-26] 21 (05/22 1615) BP: (119-187)/(83-165) 119/92 (05/22 1615) SpO2:  [89 %-99 %] 98 % (05/22 1615) Weight:  [57.2 kg] 57.2 kg (05/22 1500) General: Awake alert in no distress HEENT: Normocephalic atraumatic, clean mouth with mild gingival hyperplasia Chest:: Clear Cardiovascular: S1-2 heard regular rate rhythm Abdomen nondistended, mild tenderness all over Extremities warm well perfused Neurological exam He is awake, alert, his speech is completely garbled and cannot tell me where he is or his name. He follows simple commands such as closing his eyes, lifting his arms.  Is not able to show me 2 fingers or thumbs up. Mildly dysarthric Cranials: Pupils equal round reactive to light, extraocular movements appear intact, blinks to threat from both sides, question subtle right nasolabial fold flattening, tongue and palate midline. Motor exam: No drift  in any of the 4 extremities, no asterixis Sensory exam: Intact to touch all over Coordination: Mild generalized tremulousness but no asterixis or dysmetria. NIH stroke scale 5    Labs I have reviewed labs in epic and the results pertinent to this consultation are:   CBC    Component Value Date/Time   WBC 8.1 10/16/2020 1505   RBC 4.70 10/16/2020 1505   HGB 15.3 10/16/2020 1508   HCT 45.0 10/16/2020 1508   PLT 107 (L) 10/16/2020 1505   MCV 78.9 (L) 10/16/2020 1505   MCH 26.6 10/16/2020 1505   MCHC 33.7 10/16/2020 1505   RDW 16.4 (H) 10/16/2020 1505   LYMPHSABS 0.8 10/16/2020 1505   MONOABS 0.3 10/16/2020 1505   EOSABS 0.0 10/16/2020 1505   BASOSABS 0.1 10/16/2020 1505   AST ALT severely deranged CMP     Component Value Date/Time   NA 132 (L) 10/16/2020 1508   NA 138 01/11/2020 0959   K 4.7 10/16/2020 1508   CL 104  10/16/2020 1508   CO2 16 (L) 10/16/2020 1505   GLUCOSE 181 (H) 10/16/2020 1508   BUN 37 (H) 10/16/2020 1508   BUN 14 01/11/2020 0959   CREATININE 3.50 (H) 10/16/2020 1508   CALCIUM 7.7 (L) 10/16/2020 1505   PROT 7.1 10/16/2020 1505   ALBUMIN 2.7 (L) 10/16/2020 1505   AST 1,350 (H) 10/16/2020 1505   ALT 405 (H) 10/16/2020 1505   ALKPHOS 420 (H) 10/16/2020 1505   BILITOT 3.2 (H) 10/16/2020 1505   GFRNONAA 21 (L) 10/16/2020 1505   GFRAA 95 01/11/2020 0959   Imaging I have reviewed the images obtained:  CT-scan of the brain-no acute changes.  Visualized old left parietotemporal stroke. Repeat CT head-no acute changes  CTA head and neck with intracranial atherosclerotic disease with distal irregularity throughout the anterior and MCA bilaterally.  Most prominent in left MCA branches.  Chronic left parietal infarct.  40% stenosis of left ICA due to calcific plaque.  Assessment:  60 year old with prior stroke with residual minimal speech deficit, hypertension, alcohol abuse presenting for evaluation of sudden onset of what EMS described as left facial  weakness and word finding difficulty. Initial NIH 5. No bleed on the CT.  Decision made to use IV tPA although recrudescence of old stroke symptoms versus seizures remain in differential but he was within the time window and symptoms were of sudden onset and witnessed without any seizure activity noted. tPA bolus was administered and soon after he started generalized tremulousness and at one point also looked like he had gaze deviation downwards along with urinary incontinence noted.  At that time also noted that he had an output his cheek and had blood in his mouth. tPA was stopped after the bolus and may be another few minutes of running the infusion. He was immediately taken back to the room, IV Ativan was given multiple times for a total of 4 mg. Continued to become somnolent for which she was emergently then intubated. Also spiked a temperature of 102 Fahrenheit. Labs noted to be suggestive of hepatitis, AKI to creatinine of greater than 3 given his normal baseline was a normal creatinine. Due to the sudden onset of focal neurological deficits, possible underlying seizures, fever, a CNS infection should be ruled out but because he got a bolus dose of tPA, we will have to wait at least 24 hours before getting a spinal tap. In the meantime, he needs treatment for AKI, acute liver injury, possible alcohol withdrawal and evaluation for seizures status epilepticus./ I suspect stroke to be lower on the differentials but since he has at least received a bolus dose tPA, please see the note with special considerations  Impression: Strokelike symptoms-received tPA bolus which was then stopped due to seizure and oral bleeding. Evaluate for seizure/status epilepticus, alcohol withdrawal, delirium tremens, CNS infection or toxic metabolic encephalopathy Acute hepatitis Acute kidney injury  Recommendations:  Admit to ICU-appreciate PCCM accepting the patient to their service for period of medical  problems.  Loaded with Keppra 3 g - Continue Keppra 500 twice daily.  Vent management per ICU  Cover empirically for meningitic coverage-due to poor renal function, will do antibiotics vancomycin and ceftriaxone.  Would avoid acyclovir.  Management of AKI and acute liver injury per PCCM.  Might need nephrology consultation for possible dialysis.  Stat EEG followed by LTM EEG-to be continued also while he is in the ICU.  Close neuro monitoring.  Repeat H&H if excessive bleeding.  Special considerations in his care: Since he  received a bolus dose of tPA and a few minutes of infusion, although I do not think that that would be an effective dose but the following post tPA things should be done: - Post tPA vitals -Systolic blood pressure goal less than 180.  Use labetalol or hydralazine as needed.  If there is a need for continuous infusion, use Cleviprex. - I would continue to do 24-hour post tPA imaging -MR brain ordered w/o cotnrast - No antiplatelets or anticoagulants for at least 24 hours from tPA - LP to be deferred for at least 18 to 24 hours if not for 24 hours from tPA. Once done, send for glucose, protein, cell count, gram stain, HSV PCR.  Discussed with Dr. Delton Coombes & Posey Boyer from [facility] team in the emergency room.  Discussed with Dr. Criss Alvine  & Dr. Lance Sell, ED providers.   CRITICAL CARE ATTESTATION Performed by: Milon Dikes, MD Total critical care time: 75 minutes Critical care time was exclusive of separately billable procedures and treating other patients and/or supervising APPs/Residents/Students Critical care was necessary to treat or prevent imminent or life-threatening deterioration due to strokelike symptoms, alcohol withdrawal, acute kidney injury, possible seizures and status epilepticus This patient is critically ill and at significant risk for neurological worsening and/or death and care requires constant monitoring. Critical care was time spent personally by  me on the following activities: development of treatment plan with patient and/or surrogate as well as nursing, discussions with consultants, evaluation of patient's response to treatment, examination of patient, obtaining history from patient or surrogate, ordering and performing treatments and interventions, ordering and review of laboratory studies, ordering and review of radiographic studies, pulse oximetry, re-evaluation of patient's condition, participation in multidisciplinary rounds and medical decision making of high complexity in the care of this patient.

## 2020-10-16 NOTE — Progress Notes (Signed)
eLink Physician-Brief Progress Note Patient Name: RILLEY STASH DOB: June 30, 1960 MRN: [id number]   Date of Service  10/16/2020  HPI/Events of Note  MAP < 65. Since admit got a lit LR bolus. Has 2 PIV. Labs for LA etc just sent.   eICU Interventions  - NS 500 ml bolus, UOP ok - follow labs - decrease propofol ( elevated LFT- labs for tylenol pending) and can titrate up fenta as needed.  Call back if not better.      Intervention Category Intermediate Interventions: Hypotension - evaluation and management  [name] 10/16/2020, 10:36 PM

## 2020-10-16 NOTE — Plan of Care (Signed)
Spoke with wife Ms. [name] and updated her.  -- Milon Dikes, MD Neurologist [facility] Pager: [phone]

## 2020-10-16 NOTE — ED Notes (Signed)
50 ml of blood suctioned out of mouth

## 2020-10-16 NOTE — Progress Notes (Signed)
Patient transported from ED Room 17 to 3M13 with no complications noted.

## 2020-10-16 NOTE — ED Notes (Signed)
Pt continues with tachycardia, hypertension and tremors. Dr Wilford Corner aware and remains in ct with pt

## 2020-10-16 NOTE — ED Notes (Signed)
Pt mouth suctioned output 75 ml of blood

## 2020-10-16 NOTE — Progress Notes (Signed)
Secure chat sent to ED RN from 1st shift regarding Lactic Acid and Blood Cultures, Secure Chat was acknowledged by RN but no response, Will continue to reach out for information. Abx' given at 5:31pm along with 1 liter of fluids, pt was HTN with significant Neurological factors contributed.

## 2020-10-16 NOTE — ED Notes (Signed)
Pt remains on ct scan table, pt with increased generalized tremors.

## 2020-10-16 NOTE — Progress Notes (Signed)
Pharmacy Antibiotic Note  Arthur [name] is a 60 y.o. male admitted on 10/16/2020 as code stroke, concern for meningitis.  Pharmacy has been consulted for vancomycin, ceftriaxone dosing.  Plan: Vancomycin 1500 mg IV x 1, then variable dosing due to unstable renal function Ceftriaxone 2g IV q 12h Monitor renal function, meningitis workup and ability to narrow Vancomycin levels as needed  Height: 5\' 1"  (154.9 cm) Weight: 57.2 kg (126 lb 1.7 oz) IBW/kg (Calculated) : 52.3  Temp (24hrs), Avg:102.9 F (39.4 C), Min:102.9 F (39.4 C), Max:102.9 F (39.4 C)  Recent Labs  Lab 10/16/20 1505 10/16/20 1508  WBC 8.1  --   CREATININE 3.31* 3.50*    Estimated Creatinine Clearance: 17.2 mL/min (A) (by C-G formula based on SCr of 3.5 mg/dL (H)).    Allergies  Allergen Reactions  . Penicillins Hives    10/18/20, PharmD Clinical Pharmacist ED Pharmacist Phone # [phone] 10/16/2020 5:10 PM

## 2020-10-16 NOTE — Progress Notes (Signed)
PHARMACIST CODE STROKE RESPONSE  Notified to mix tPA at 1520 by Dr. Wilford Corner Delivered tPA to RN at 1523  tPA dose = 5.2mg  bolus over 1 minute followed by 46.3mg  for a total dose of 51.5mg  over 1 hour  Issues/delays encountered (if applicable): Pt displayed seizure like activity after scan  Arthur [name] 10/16/20 4:10 PM

## 2020-10-16 NOTE — ED Notes (Signed)
Pt with increased amount of bleeding from mouth at this time. Dr Wilford Corner and ED Dr Lance Sell at bedside

## 2020-10-16 NOTE — ED Notes (Signed)
Attempted to call report 4N charge nurse stated pt will be reassigned

## 2020-10-16 NOTE — Progress Notes (Signed)
Elink following pt for Sepsis Protocol, "Sepsis was started at 16:55

## 2020-10-16 NOTE — Progress Notes (Addendum)
eLink Physician-Brief Progress Note Patient Name: Arthur [name] DOB: 1960/10/18 MRN: [id number]   Date of Service  10/16/2020  HPI/Events of Note  Brief new admit note:    60 yo chronically ill man with EtOH abuse, HTN, L MCA CVA. He developed garbled speech and AMS at home 5/22, presented to ED with this as well as transaminitis, acute renal failure. Significant concern for acute CVA, initial head CT negative. Started bolus of t-PA but this was immediately stopped because he developed acute seizure. There was an apparent tongue or cheek laceration with OP bleeding. He required intubation for airway protection following ativan and post-ictal state.   EEG encephalopathy. On empiric abx for Meningitis.   Camera: VS: sinus tachy 100's. MAP > 65. In synchrony with Vent. Vt < 8 ml/ibw. sats > 92%. Oral bleeding, mild. ET and OG no bleeding noted. No decube ulcer/wounds on back. VENT:420/10/16/38%.   Data: Reviewed.  7.31/42/95/21. Hg 12. Was at 13.6. CxR film seen. ET in place, no PNA or chf.  INR 1.2 Wbc 8.1, AST/ALT/ALP: 1350/405/420. USG abdomen just done- pending to report. CTH/CT angio brain/neck: no acute changes ECHO EF 60% in 2021  A/P 1. Acute encephalopathy, mostly alcohol withdrawal, uremic, liver and  fever in ED. Covid neg Suspected meningitis on empiric abx for now. tPA was stopped due to seizures. Mild oral bleeds from seizure induced tongue/cheek lac. 2. On Vent for airway protection. No PNA. 3. Elevated transamintis and ALP/TB > 3.2, mostly alcohol related.   - continue current care, Neuro input reviewed - follow USG abdomen, labs. Getting MRI/ECHO once stabilized  - foley for I/O monitoring - consider LP/ID consultation in AM once stable . LP can be done post 24 hrs due to receiving partial tPA in ED . -follow  Lipase/amylase/LA/trop/tox screening.  - keep MAP > 65 - VAP bundle VTE: SCD for now CBG: goal < 180, on ssi.    eICU Interventions        Intervention Category Major Interventions: Respiratory failure - evaluation and management;Change in mental status - evaluation and management Evaluation Type: New Patient Evaluation  [name] 10/16/2020, 9:37 PM

## 2020-10-16 NOTE — ED Notes (Signed)
Mrs.Lenart, pt wife, called for update please call back [phone]

## 2020-10-16 NOTE — ED Notes (Signed)
Dr Wilford Corner arrived as pt was being transported to ct scan and quick evaluation done.  Nurse noted jaundice to scalar and mild tremor to uppers extremities bilaterally.  Dr Wilford Corner aware of findings.

## 2020-10-16 NOTE — Procedures (Addendum)
Patient Name: Arthur [name]  MRN: [id number]  Epilepsy Attending: Charlsie Quest  Referring Physician/Provider: Dr Milon Dikes Date: 10/16/2020 Duration: 42.27 mins  Patient history: 60 year old with prior stroke with residual minimal speech deficit, hypertension, alcohol abuse presenting for evaluation of sudden onset of what EMS described as left facial weakness and word finding difficulty. EEG to evaluate for seizure.   Level of alertness:  comatose  AEDs during EEG study: LEV, Propofol  Technical aspects: This EEG study was done with scalp electrodes positioned according to the 10-20 International system of electrode placement. Electrical activity was acquired at a sampling rate of 500Hz  and reviewed with a high frequency filter of 70Hz  and a low frequency filter of 1Hz . EEG data were recorded continuously and digitally stored.   Description:  EEG showed continuous generalized 3 to 6 Hz theta-delta slowing admixed with 15 to 18 Hz beta activity distributed symmetrically and diffusely.  Hyperventilation and photic stimulation were not performed.     P7 electrode artifact was noted during the study.  ABNORMALITY - Continuous slow, generalized  IMPRESSION: This study is suggestive of severe diffuse encephalopathy, nonspecific etiology but likely related to sedation.  No seizures or epileptiform discharges were seen during the study  Arthur [name] 

## 2020-10-16 NOTE — ED Notes (Signed)
Patient transported to CT with nurse and RT

## 2020-10-17 ENCOUNTER — Inpatient Hospital Stay (HOSPITAL_COMMUNITY): Payer: Medicaid Other

## 2020-10-17 DIAGNOSIS — I6389 Other cerebral infarction: Secondary | ICD-10-CM

## 2020-10-17 DIAGNOSIS — G928 Other toxic encephalopathy: Secondary | ICD-10-CM

## 2020-10-17 DIAGNOSIS — Z978 Presence of other specified devices: Secondary | ICD-10-CM

## 2020-10-17 LAB — COMPREHENSIVE METABOLIC PANEL
ALT: 309 U/L — ABNORMAL HIGH (ref 0–44)
AST: 1035 U/L — ABNORMAL HIGH (ref 15–41)
Albumin: 2.2 g/dL — ABNORMAL LOW (ref 3.5–5.0)
Alkaline Phosphatase: 345 U/L — ABNORMAL HIGH (ref 38–126)
Anion gap: 8 (ref 5–15)
BUN: 29 mg/dL — ABNORMAL HIGH (ref 6–20)
CO2: 19 mmol/L — ABNORMAL LOW (ref 22–32)
Calcium: 7.2 mg/dL — ABNORMAL LOW (ref 8.9–10.3)
Chloride: 107 mmol/L (ref 98–111)
Creatinine, Ser: 3.43 mg/dL — ABNORMAL HIGH (ref 0.61–1.24)
GFR, Estimated: 20 mL/min — ABNORMAL LOW (ref >60)
Glucose, Bld: 92 mg/dL (ref 70–99)
Potassium: 4.2 mmol/L (ref 3.5–5.1)
Sodium: 134 mmol/L — ABNORMAL LOW (ref 135–145)
Total Bilirubin: 2.2 mg/dL — ABNORMAL HIGH (ref 0.3–1.2)
Total Protein: 5.9 g/dL — ABNORMAL LOW (ref 6.5–8.1)

## 2020-10-17 LAB — ECHOCARDIOGRAM COMPLETE
Area-P 1/2: 2.77 cm2
Height: 61 in
S' Lateral: 3.6 cm
Single Plane A4C EF: 39.4 %
Weight: 2010.6 oz

## 2020-10-17 LAB — MAGNESIUM
Magnesium: 2.7 mg/dL — ABNORMAL HIGH (ref 1.7–2.4)
Magnesium: 2.4 mg/dL (ref 1.7–2.4)

## 2020-10-17 LAB — LACTIC ACID, PLASMA: Lactic Acid, Venous: 1.5 mmol/L (ref 0.5–1.9)

## 2020-10-17 LAB — LIPID PANEL
Cholesterol: 217 mg/dL — ABNORMAL HIGH (ref 0–200)
HDL: <10 mg/dL — ABNORMAL LOW (ref >40)
LDL Cholesterol: UNDETERMINED mg/dL (ref 0–99)
Triglycerides: 480 mg/dL — ABNORMAL HIGH (ref <150)
VLDL: UNDETERMINED mg/dL (ref 0–40)

## 2020-10-17 LAB — CSF CELL COUNT WITH DIFFERENTIAL
Eosinophils, CSF: 0 % (ref 0–1)
Lymphs, CSF: 87 % — ABNORMAL HIGH (ref 40–80)
Monocyte-Macrophage-Spinal Fluid: 8 % — ABNORMAL LOW (ref 15–45)
RBC Count, CSF: 24 /mm3 — ABNORMAL HIGH
Segmented Neutrophils-CSF: 5 % (ref 0–6)
Tube #: 3
WBC, CSF: 14 /mm3 (ref 0–5)

## 2020-10-17 LAB — GLUCOSE, CAPILLARY
Glucose-Capillary: 88 mg/dL (ref 70–99)
Glucose-Capillary: 92 mg/dL (ref 70–99)
Glucose-Capillary: 106 mg/dL — ABNORMAL HIGH (ref 70–99)
Glucose-Capillary: 97 mg/dL (ref 70–99)
Glucose-Capillary: 85 mg/dL (ref 70–99)
Glucose-Capillary: 111 mg/dL — ABNORMAL HIGH (ref 70–99)

## 2020-10-17 LAB — PROTEIN AND GLUCOSE, CSF
Glucose, CSF: 65 mg/dL (ref 40–70)
Total  Protein, CSF: 56 mg/dL — ABNORMAL HIGH (ref 15–45)

## 2020-10-17 LAB — MRSA PCR SCREENING: MRSA by PCR: NEGATIVE

## 2020-10-17 LAB — HEPATITIS PANEL, ACUTE
HCV Ab: NONREACTIVE
Hep A IgM: NONREACTIVE
Hep B C IgM: NONREACTIVE
Hepatitis B Surface Ag: NONREACTIVE

## 2020-10-17 LAB — CBC
HCT: 30.7 % — ABNORMAL LOW (ref 39.0–52.0)
Hemoglobin: 10.6 g/dL — ABNORMAL LOW (ref 13.0–17.0)
MCH: 26.7 pg (ref 26.0–34.0)
MCHC: 34.5 g/dL (ref 30.0–36.0)
MCV: 77.3 fL — ABNORMAL LOW (ref 80.0–100.0)
Platelets: 74 10*3/uL — ABNORMAL LOW (ref 150–400)
RBC: 3.97 MIL/uL — ABNORMAL LOW (ref 4.22–5.81)
RDW: 17.2 % — ABNORMAL HIGH (ref 11.5–15.5)
WBC: 6.8 10*3/uL (ref 4.0–10.5)
nRBC: 0 % (ref 0.0–0.2)

## 2020-10-17 LAB — PROTIME-INR
INR: 1.4 — ABNORMAL HIGH (ref 0.8–1.2)
Prothrombin Time: 17.1 seconds — ABNORMAL HIGH (ref 11.4–15.2)

## 2020-10-17 LAB — CRYPTOCOCCAL ANTIGEN, CSF: Crypto Ag: NEGATIVE

## 2020-10-17 LAB — PROCALCITONIN: Procalcitonin: 2.88 ng/mL

## 2020-10-17 LAB — TROPONIN I (HIGH SENSITIVITY): Troponin I (High Sensitivity): 134 ng/L (ref <18)

## 2020-10-17 LAB — LDL CHOLESTEROL, DIRECT: Direct LDL: 124.3 mg/dL — ABNORMAL HIGH (ref 0–99)

## 2020-10-17 LAB — VANCOMYCIN, RANDOM: Vancomycin Rm: <4

## 2020-10-17 LAB — PHOSPHORUS: Phosphorus: 4.9 mg/dL — ABNORMAL HIGH (ref 2.5–4.6)

## 2020-10-17 MED ORDER — SODIUM CHLORIDE 0.9 % IV SOLN: 20.0000 mg/kg | Freq: Once | INTRAVENOUS | Status: DC

## 2020-10-17 MED ORDER — SODIUM CHLORIDE 0.9 % IV SOLN
20.0000 mg/kg | INTRAVENOUS | Status: AC
  Administered 2020-10-17: 1140 mg via INTRAVENOUS
  Filled 2020-10-17: qty 22.8

## 2020-10-17 MED ORDER — ORAL CARE MOUTH RINSE
15.0000 mL | OROMUCOSAL | Status: DC
  Administered 2020-10-17 – 2020-10-20 (×34): 15 mL via OROMUCOSAL

## 2020-10-17 MED ORDER — GADOBUTROL 1 MMOL/ML IV SOLN
5.0000 mL | Freq: Once | INTRAVENOUS | Status: AC | PRN
  Administered 2020-10-17: 5 mL via INTRAVENOUS

## 2020-10-17 MED ORDER — CHLORHEXIDINE GLUCONATE 0.12% ORAL RINSE (MEDLINE KIT)
15.0000 mL | Freq: Two times a day (BID) | OROMUCOSAL | Status: DC
  Administered 2020-10-17 – 2020-10-20 (×7): 15 mL via OROMUCOSAL

## 2020-10-17 MED ORDER — LIDOCAINE HCL (PF) 1 % IJ SOLN: 5.0000 mL | Freq: Once | INTRAMUSCULAR | Status: AC

## 2020-10-17 MED ORDER — LACTATED RINGERS IV SOLN: INTRAVENOUS | Status: DC

## 2020-10-17 MED ORDER — VANCOMYCIN HCL 1250 MG/250ML IV SOLN
1250.0000 mg | INTRAVENOUS | Status: DC
  Filled 2020-10-17: qty 250

## 2020-10-17 MED ORDER — PHENYTOIN SODIUM 50 MG/ML IJ SOLN
100.0000 mg | Freq: Three times a day (TID) | INTRAMUSCULAR | Status: DC
  Administered 2020-10-17 – 2020-10-19 (×5): 100 mg via INTRAVENOUS
  Filled 2020-10-17 (×5): qty 2

## 2020-10-17 MED ORDER — LACTULOSE 10 GM/15ML PO SOLN
20.0000 g | Freq: Two times a day (BID) | ORAL | Status: DC
  Administered 2020-10-17 – 2020-10-19 (×6): 20 g
  Filled 2020-10-17 (×7): qty 30

## 2020-10-17 MED ORDER — LIDOCAINE HCL (PF) 1 % IJ SOLN
INTRAMUSCULAR | Status: AC
  Administered 2020-10-17: 5 mL
  Filled 2020-10-17: qty 30

## 2020-10-17 MED ORDER — LINEZOLID 600 MG/300ML IV SOLN
600.0000 mg | Freq: Two times a day (BID) | INTRAVENOUS | Status: DC
  Administered 2020-10-17 – 2020-10-19 (×6): 600 mg via INTRAVENOUS
  Filled 2020-10-17 (×7): qty 300

## 2020-10-17 MED ORDER — VITAL AF 1.2 CAL PO LIQD
1000.0000 mL | ORAL | Status: DC
  Administered 2020-10-17 – 2020-10-19 (×3): 1000 mL

## 2020-10-17 NOTE — Progress Notes (Signed)
PT Cancellation Note  Patient Details Name: Arthur [name] MRN: [id number] DOB: 06-Aug-1960   Cancelled Treatment:    Reason Eval/Treat Not Completed: Active bedrest order; pt -4 RASS (deep sedation)  Will monitor for appropriateness for PT evaluation.    Jerolyn Center, PT Pager [phone]    Zena Amos 10/17/2020, 8:20 AM

## 2020-10-17 NOTE — Progress Notes (Signed)
LTM EEG hooked up and running - no initial skin breakdown - push button tested - neuro notified. [facility] monitoring. [facility] monitored, Event button test confirmed by [facility]. Nurse aware of how and when to use event button. This is a rehook with new leads that are CT compatible.

## 2020-10-17 NOTE — Progress Notes (Addendum)
Subjective: No clinical seizures overnight.  ROS: Unable to obtain due to poor mental status  Examination  Vital signs in last 24 hours: Temp:  [99.14 F (37.3 C)-102.9 F (39.4 C)] 100.04 F (37.8 C) (05/23 1000) Pulse Rate:  [86-148] 86 (05/23 1000) Resp:  [12-26] 18 (05/23 1000) BP: (83-187)/(66-165) 129/81 (05/23 1000) SpO2:  [89 %-100 %] 100 % (05/23 1145) FiO2 (%):  [40 %-60 %] 40 % (05/23 1145) Weight:  [57 kg-57.2 kg] 57 kg (05/23 0500)  General: lying in bed, not in apparent distress CVS: pulse-normal rate and rhythm RS: Intubated, coarse breath sounds bilaterally Extremities: normal, warm Neuro: On propofol, comatose, does not open eyes noxious stimuli, does not follow commands, pupils pinpoint and difficult appreciate any reactivity, corneal reflex intact, gag reflex intact.  Per RN earlier when propofol was turned off, patient opened his eyes but did not follow commands, was moving all extremities symmetrically  Basic Metabolic Panel: Recent Labs  Lab 10/16/20 1505 10/16/20 1508 10/16/20 1729 10/16/20 2007 10/16/20 2232 10/17/20 0151 10/17/20 0746  NA 129* 132* 136 136  --  134*  --   K 4.6 4.7 5.0 4.6  --  4.2  --   CL 98 104  --   --   --  107  --   CO2 16*  --   --   --   --  19*  --   GLUCOSE 188* 181*  --   --   --  92  --   BUN 33* 37*  --   --   --  29*  --   CREATININE 3.31* 3.50*  --   --   --  3.43*  --   CALCIUM 7.7*  --   --   --   --  7.2*  --   MG  --   --   --   --  1.6*  --  2.7*  PHOS  --   --   --   --  4.5  --   --     CBC: Recent Labs  Lab 10/16/20 1505 10/16/20 1508 10/16/20 1729 10/16/20 2007 10/17/20 0151  WBC 8.1  --   --   --  6.8  NEUTROABS 6.8  --   --   --   --   HGB 12.5* 15.3 13.6 12.9* 10.6*  HCT 37.1* 45.0 40.0 38.0* 30.7*  MCV 78.9*  --   --   --  77.3*  PLT 107*  --   --   --  74*     Coagulation Studies: Recent Labs    10/16/20 1505 10/17/20 0151  LABPROT 15.3* 17.1*  INR 1.2 1.4*    Imaging  CT  head without contrast 10/16/2020: No acute intracranial abnormality. Signs of prior infarct in the LEFT occipital region and RIGHT basal ganglia, atrophy and chronic microvascular ischemic change without change.  ASSESSMENT AND PLAN: 60 year old male with history of stroke, alcohol use who presented with sudden onset speech disturbance, left facial droop status post tPA.  Also noted to be febrile and had seizures.  Seizure Fever Acute encephalopathy, multifactorial Chronic stroke Alcohol use disorder AKI Hyperammonemia Hyponatremia Transaminitis Hypoproteinemia with hypoalbuminemia Hyperbilirubinemia -LTM EEG showed seizures without clinical signs this morning. -Continues to have low-grade fever -Seizures likely secondary to underlying stroke.  Of note, patient's wife reported alcohol use but alcohol level was less than 10 on arrival.  Low suspicion for meningitis/encephalitis but patient is febrile therefore will consider lumbar  puncture if patient remains febrile.  Cannot obtain lumbar puncture till about 24 hours after tPA (administered at 1530 yesterday)  Recommendations -Loaded with fosphenytoin 20 mg/kg once, start Dilantin 100 mg 3 times daily for maintenance -Continue Keppra 500 mg twice daily (renally adjusted) -Continue LTM EEG under patient remains seizure-free and has been off sedation -If seizures persist, will add phenobarbital -We will plan to obtain MRI brain with and without contrast to look for acute abnormality, enhancement after seizures are controlled -We will consider obtaining lumbar puncture depending on MRI findings and if patient remains febrile -We will plan to wean off sedation tomorrow if patient remains seizure-free -Continue seizure precautions -Management of rest of comorbidities per primary team  CRITICAL CARE Performed by: Charlsie Quest   Total critical care time:  Critical care time was exclusive of separately billable procedures  and treating other patients.  Critical care was necessary to treat or prevent imminent or life-threatening deterioration.  Critical care was time spent personally by me on the following activities: development of treatment plan with patient and/or surrogate as well as nursing, discussions with consultants, evaluation of patient's response to treatment, examination of patient, obtaining history from patient or surrogate, ordering and performing treatments and interventions, ordering and review of laboratory studies, ordering and review of radiographic studies, pulse oximetry and re-evaluation of patient's condition.  Lindie Spruce Epilepsy [facility] For questions after 5pm please refer to AMION to reach the Neurologist on call

## 2020-10-17 NOTE — Progress Notes (Signed)
NAME:  Arthur [name], MRN:  [id number], DOB:  1960-09-06, LOS: 1 ADMISSION DATE:  10/16/2020, CONSULTATION DATE:  10/16/2020 REFERRING MD:  Dr. Verner Chol, CHIEF COMPLAINT:  AMS  History of Present Illness:  HPI obtained from medical chart review and per wife via phone.  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  He was able to follow some commands and moved all extremities on arrival.  Code stroke underway.  Initial CTH neg.   tPA was started then patient started to have tremulous movements and blood coming from mouth (noted to have cheek laceration) concerning for seizure activity, and given ETOH hx, tPA was therefore stopped.  Suspected to have possible ETOH withdrawal seizures.  He only received 5.1 mg of tPA at 1523 on 5/22.  CTA head/ neck neg for LVO.  Given declining mental status, he was intubated for airway protection and placed on propofol for sedation.  Per his wife via phone, he drinks "a lot" per wife, including beer and liquor, but only had one beer yesterday/ 5/21, 5/22.   Vitals noted 102.9, BP 177/123.  Labs significant for Na 132, glucose 181, BUN 37, sCr 3.50 (previously 1.35 in 07/2020), normal WBC, plt 107, normal coags, alk phos 420, AST/ ALT 1350/ 405, t.bili 32.  COVID and remaining labs pending.  CXR neg.  Ceftriaxone and vanc to be started after cultures obtained.  Seen by Neurology in ED and  code stroke was called on 5/22. TPA initiated but stopped due blood in mouth and concern for seizure. Repeat CT head negative for seizure.  PCCM consulted for admission.   He remains in the 42M ICU in critical condition.  Pertinent  Medical History  HTN, HLD, CVA (residual   Significant Hospital Events: Including procedures, antibiotic start and stop dates in addition to other pertinent events    . 5/22 admitted with acute encephalopathy, initially thought to be stroke, CTH neg, tPA bolus 16m given, then stopped after developing seizure activity.  Intubated for airway protection/ declining mental status.  Febrile with AKI and Transaminitis. Vanc & ceftriaxone> . 5/23 MRI>, 5/23 LP > .   Interim History / Subjective:  Admit. TPA (about 512m given but stopped due to oral bleeding. ?Seizure.   EEG started Subclinical seizures seen on EEG  Tmax 102.9  921ML UOP past 24 hours. -30274mdmission  Unable to obtain subjective evaluation due to patient status Objective   Blood pressure 123/77, pulse 100, temperature 99.32 F (37.4 C), resp. rate 18, height _0  (1.549 m), weight 57 kg, SpO2 100 %.    Vent Mode: PRVC FiO2 (%):  [40 %-60 %] 40 % Set Rate:  [18 bmp] 18 bmp Vt Set:  [420 mL] 420 mL PEEP:  [5 cmH20] 5 cmH20 Plateau Pressure:  [14 cmH20] 14 cmH20   Intake/Output Summary (Last 24 hours) at 10/17/2020 0835 Last data filed at 10/17/2020 0800 Gross per 24 hour  Intake 1005.06 ml  Output 1315 ml  Net -309.94 ml   Filed Weights   10/16/20 1500 10/17/20 0500  Weight: 57.2 kg 57 kg    Examination: Currently on propofol & fentanyl gtt General:  Ill appearing, no acute distress, in bed HEENT: MM pink/moist, anicteric, trachea midline  Neuro: GCS 7T, withdraws to pain, eyes open to pain, RASS -  2, PERRL 82m CV: S1S2, NSR, no m/r/g appreciated PULM:  Clear in the upper lobes, Clear in the lower lobes, scant secretions, chest expansion symmetric GI: soft, bsx4 active, nondistended   Extremities: warm/dry, no pretibial edema, capillary refill greater/less than 3 seconds  Skin: no rashes or lesions   Labs/imaging that I havepersonally reviewed  (right click and "Reselect all SmartList Selections" daily)  BC, CXR, CMP, CBC, ammonia, lactate,    Resolved Hospital Problem list    Assessment & Plan:   Acute encephalopathy- likely toxic/ metabolic  Concern for ETOH  withdrawal seizures/ r/o SE R/o CNS process/ infection w/ fever Hx of prior L MCA CVA 09/2019 with residual speech issues (no intervention given he presented out of window) ETOH abuse- last drink, 1 beer 5/21 per wife  initially presenting as acute CVA, initial CTH neg, tPA bolus (5.110m given but stopped when patient developed seizure activity, with declining mental status/ postictal, thought to be possible ETOH withdrawal related seizure.  Can not rule out CNS process/ infection given fever. TMAX 102.9 P:  -EEG ongoing. Subclinical seizures seen, starting fosphenytoin load. Spoke with Dr. YaHortense RamalIf seizures stop post load, she feels LP is not needed. If seizures continue, proceed with LP -Neurology Following. Appreciate assistance. Spoke with Dr. KiLeonel Ramsayegarding LP today to rule CNS infection. Neurology plans to do LP today. Send CSF for glucose, protein, cell count, gram stain, HSV PCR. -SBP goal less than 180, Cleviprex ordered if needed -On Vanc and ceftriaxone, switch Vanc to linezolid for additional listeria coverage in setting of PCN allergy. Deferring acylovir due to aki/lft elevation -Stroke work up per neurology -Continue thiamine and folate -MRI 24 hours post TPA. Today at 1500. Follow up imaging -Keppra for seizure prophylaxis  -Neuroprotective measures- HOB greater than 30 degrees, head in neutral position, normonatremia, CO2 35-40. -PAD bundle with Propofol and fentanyl. Goal RASS -1. Titrate medication to goal. -Monitor for ETOH withdraw. Currently on propofol. -ETOH abuse counseling when appropriate  Acute Respiratory Failure with Hypercarbia requiring Mechanical Ventilation ?possible aspiration Secondary P:  LTVV strategy with tidal volumes of 4-8 cc/kg ideal body weight Goal plateau pressures of 30 and driving pressures of 15 Wean PEEP/FiO2 for SpO2 greater than 92 Follow intermittent CXR and ABG PRN PAD bundle as discussed Daily SAT/SBT  AKI Hyponatremia  -  unclear etiology at this time, not hypotensive, could be related to ETOH/ ACEi at home vs hepatorenal. 92538mOP since admission. Creat 3.5>3.43 P:  Ensure renal perfusion. Goal MAP 65 or greater. Starting LR at 75 Avoid neprotoxic drugs as possible. Strict I&O's Follow up AM creatinine Continue foley Monitor sodium Replete and replace electrolytes as indicated AM BMP  Transaminitis Thrombocytopenia -Initial MELD score 24 with a 19.6% 3 month mortality rate; discriminant score 13.8, RUQ ultrasound shows mild hepatic stenosis. PLT 107>74. Ammonia 70. AST 1035, ALT 309 Ratio greater than 2.  Suspect chronic ETOH abuse vs hepatorenal syndrome P: Hepatitis panel sent. Follow up. Trend coags/LFTs Start Lactulose 20MG BID AM LFTS  DM, Hyperglycemia  -BG 88 to 181, A1C 6.8 P:  Blood Glucose goal 140-180. Continue SSI Insulin  Fever without leukocytosis -? Aspiration. PCT 3.01>2.88, Lactate 2.2> 1.5  P:  Follow up blood cultures Empiric CTX and Vanc LP as discussed  Hx HTN P:  Continue holding home noravasc ans lisinopril SBP goal as discussed Cleviprex PRN  Acute Blood Loss Anemia Oropharyngeal Bleeding Suspect Post seizure and TPA administration P: Monitor Transfuse PRBC if HBG less than 7  Obtain AM CBC to trend H&H   Best practice (right click and "Reselect all SmartList Selections" daily)  Diet:  NPO Pain/Anxiety/Delirium protocol (if indicated): Yes (RASS goal -1,-2) VAP protocol (if indicated): Yes DVT prophylaxis: SCD GI prophylaxis: PPI Glucose control:  SSI Yes Central venous access:  Yes, and it is still needed Arterial line:  N/A Foley:  Yes, and it is still needed Mobility:  bed rest  PT consulted: N/A Last date of multidisciplinary goals of care discussion [5/22] Code Status:  full code Disposition:  ICU  Critical care time: 53 mins     Redmond School., MSN, APRN, AGACNP-BC [facility]  10/17/2020 , 8:35  AM  Please see [email or web address] for pager details  If no response, please call [phone] After hours, please call Elink at [phone]

## 2020-10-17 NOTE — Progress Notes (Signed)
OT Cancellation Note  Patient Details Name: Arthur [name] MRN: [id number] DOB: June 02, 1960   Cancelled Treatment:    Reason Eval/Treat Not Completed: Patient not medically ready;Active bedrest order. Pt -4 RASS (deep sedation). OT will continue to follow for appropriateness.   Evern Bio Lanaiya Lantry 10/17/2020, 8:27 AM   Nyoka Cowden OTR/L Acute Rehabilitation Services Pager: [phone] Office: [phone]

## 2020-10-17 NOTE — Procedures (Signed)
Patient Name: Arthur [name]  MRN: [id number]  Epilepsy Attending: Charlsie Quest  Referring Physician/Provider: Dr Milon Dikes Duration: 10/16/2020 1830 10/17/2020 1652  Patient history: 60 year old with prior stroke with residual minimal speech deficit, hypertension, alcohol abuse presenting for evaluation of sudden onset of what EMS described as left facial weakness and word finding difficulty. EEG to evaluate for seizure.   Level of alertness:  comatose  AEDs during EEG study: LEV, Propofol  Technical aspects: This EEG study was done with scalp electrodes positioned according to the 10-20 International system of electrode placement. Electrical activity was acquired at a sampling rate of 500Hz  and reviewed with a high frequency filter of 70Hz  and a low frequency filter of 1Hz . EEG data were recorded continuously and digitally stored.   Description:  EEG initially showed continuous generalized and maximal left frontotemporal region 3 to 6 Hz theta-delta slowing admixed with 15 to 18 Hz beta activity distributed symmetrically and diffusely. After around 0724 on 10/17/2020, eeg showed seizures without clinical signs arising from left frontotemporal region, average 3-4/ hour, lasting about 30 seconds each. Lateralized periodic discharges at 0.5-1Hz  also noted in left frontotemporal region. Last seizure was noted on 10/17/2020 at 0908. Hyperventilation and photic stimulation were not performed.   ABNORMALITY - Seizure without clinical signs, left frontotemporal region - Lateralized periodic discharges, left frontotemporal region - Continuous slow, generalized and maximal left frontotemporal region  IMPRESSION: This study showed seizures without clinical signs arising from left frontotemporal region, average 3-4/hour, duration about 20 seconds, last seizure at 0908 on 10/17/2020.  Additionally there is evidence of cortical dysfunction and epileptogenicity arising from left frontotemporal region  due to underlying stroke.  There is also severe diffuse encephalopathy, nonspecific etiology but likely related to sedation.   Keisa Blow 10/19/2020

## 2020-10-17 NOTE — Progress Notes (Signed)
Removed EEG leads for MRI.  No skin breakdown observed.  Pt will be reconnected after MRI.

## 2020-10-17 NOTE — Progress Notes (Signed)
eLink Physician-Brief Progress Note Patient Name: JOHNTHAN AXTMAN DOB: 1960-06-21 MRN: [id number]   Date of Service  10/17/2020  HPI/Events of Note  Elevated troponin, mostly type 2. - follow troponin.  Elevated lipase/LFT. USG steathosis. No pancreatitis.  Follow labs. Mag low. Replacement ordered. Tylenol level ok. Watch for now. Consider hepatitis panel. Ordering. ammonia 70.  CPK 420. On fluids.   eICU Interventions  As above     Intervention Category Intermediate Interventions: Diagnostic test evaluation  [name] 10/17/2020, 12:45 AM

## 2020-10-17 NOTE — Progress Notes (Signed)
maint complete.  

## 2020-10-17 NOTE — Progress Notes (Signed)
Initial Nutrition Assessment  DOCUMENTATION CODES:   Severe malnutrition in context of social or environmental circumstances  INTERVENTION:   Tube feeding via OG tube: - Start Vital AF 1.2 @ 20 ml/hr and advance by 10 ml q 8 hours to goal rate of 50 ml/hr (1200 ml/day)  Tube feeding regimen at goal provides 1440 kcal, 90 grams of protein, and 973 ml of H2O.  Tube feeding regimen at goal and current propofol provides 1712 total kcal (104% of needs).  Monitor magnesium, potassium, and phosphorus daily for at least 3 days, MD to replete as needed, as pt is at risk for refeeding syndrome given EtOH abuse, severe malnutrition.  NUTRITION DIAGNOSIS:   Severe Malnutrition related to social / environmental circumstances (EtOH abuse) as evidenced by severe muscle depletion,severe fat depletion.  GOAL:   Patient will meet greater than or equal to 90% of their needs  MONITOR:   Vent status,Labs,Weight trends,TF tolerance  REASON FOR ASSESSMENT:   Ventilator,Consult Enteral/tube feeding initiation and management  ASSESSMENT:   60 year old male who presented to the ED on 5/22 with aphasia, left facial droop. PMH of HTN, EtOH abuse, L MCA CVA. Pt was receiving tPA when he developed seizures. Pt required intubation for airway protection.   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 Pt with AKI and transaminitis. Neurology plans to do LP today as well as obtain MRI of the brain.  Consult received for tube feeding initiation and management. Pt with OG tube in place. Per flowsheets, pt with coffee-ground output from OG on 5/22.  No family present at time of RD assessment so unable to obtain diet and weight history at this time. Given malnutrition and EtOH abuse, pt is at risk for refeeding syndrome. Will start tube feeds at trickle rate and advance slowly to goal.  Reviewed weight history in chart. Pt with a 6.1 kg weight loss since 01/11/20. This is a 9.7% weight loss in 9  months which is not significant for timeframe but is concerning given severe malnutrition.  Patient is currently intubated on ventilator support MV: 8.0 L/min Temp (24hrs), Avg:100.5 F (38.1 C), Min:99.14 F (37.3 C), Max:102.9 F (39.4 C)  Drips: Propofol: 10.3 ml/hr (provides 272 kcal daily from lipid) Fentanyl LR: 75 ml/hr  Medications reviewed and include: IV folic acid, lactulose, SSI q 4 hours, IV protonix, IV thiamine, IV abx, keppra  Labs reviewed: sodium 134, BUN 29, creatinine 3.43, ionized calcium 1.01, magnesium 2.7, elevated LFTs, TG 480 CBG's: 88-128 x 24 hours  UOP: 925 ml x 24 hours I/O's: +228 ml since admit  NUTRITION - FOCUSED PHYSICAL EXAM:  Flowsheet Row Most Recent Value  Orbital Region Moderate depletion  Upper Arm Region Severe depletion  Thoracic and Lumbar Region Severe depletion  Buccal Region Unable to assess  Temple Region Moderate depletion  Clavicle Bone Region Severe depletion  Clavicle and Acromion Bone Region Severe depletion  Scapular Bone Region Unable to assess  Dorsal Hand Moderate depletion  Patellar Region Moderate depletion  Anterior Thigh Region Severe depletion  Posterior Calf Region Severe depletion  Edema (RD Assessment) None  Hair Reviewed  Eyes Unable to assess  Mouth Unable to assess  Skin Reviewed  Nails Reviewed       Diet Order:   Diet Order            Diet NPO time specified  Diet effective now                 EDUCATION  NEEDS:   Not appropriate for education at this time  Skin:  Skin Assessment: Reviewed RN Assessment  Last BM:  no documented BM  Height:   Ht Readings from Last 1 Encounters:  10/16/20 5\' 1"  (1.549 m)    Weight:   Wt Readings from Last 1 Encounters:  10/17/20 57 kg    BMI:  Body mass index is 23.74 kg/m.  Estimated Nutritional Needs:   Kcal:  1650  Protein:  80-100 grams  Fluid:  >/= 1.6 L    10/19/20, MS, RD, LDN Inpatient Clinical Dietitian Please see  AMiON for contact information.

## 2020-10-17 NOTE — Progress Notes (Signed)
eLink Physician-Brief Progress Note Patient Name: Arthur [name] DOB: 1961-01-14 MRN: [id number]   Date of Service  10/17/2020  HPI/Events of Note  CSF with 14 WBC, however, Gram stain reveals no organisms. Patient is currently on Zyvox and Rocephin. MRI results: Abnormal diffusion signal involving the left hippocampus. Primary differential considerations are seizure effect and subacute infarct.  eICU Interventions  Continue present management.     Intervention Category Major Interventions: Other:  Lenell Antu 10/17/2020, 9:44 PM

## 2020-10-17 NOTE — Progress Notes (Signed)
  Echocardiogram 2D Echocardiogram has been performed.  Arthur [name] F 10/17/2020, 5:58 PM

## 2020-10-17 NOTE — Procedures (Signed)
Lumbar Puncture Procedure Note  Arthur [name]  [id number]  02-20-1961  Date:10/17/20  Time:3:36 PM   Provider Performing:Belem Hintze Celine Mans   Procedure: Lumbar Puncture (26415)  Indication(s) Rule out meningitis  Consent Risks of the procedure as well as the alternatives and risks of each were explained to the patient and/or caregiver.  Consent for the procedure was obtained and is signed in the bedside chart  Anesthesia Topical only with 1% lidocaine    Time Out Verified patient identification, verified procedure, site/side was marked, verified correct patient position, special equipment/implants available, medications/allergies/relevant history reviewed, required imaging and test results available.   Sterile Technique Maximal sterile technique including sterile barrier drape, hand hygiene, sterile gown, sterile gloves, mask, hair covering.    Procedure Description Using palpation, approximate location of L3-L4 space identified.   Lidocaine used to anesthetize skin and subcutaneous tissue overlying this area.  A 20g spinal needle was then used to access the subarachnoid space. Opening pressure:Not obtained. Closing pressure:Not obtained. Clear CSF obtained (pink tinged tube in tube 1).  Complications/Tolerance None; patient tolerated the procedure well.   EBL Minimal   Specimen(s) CSF   Rutherford Guys, PA - [facility] For pager details, please see AMION or use Epic chat  After 1900, please call [facility] for cross coverage needs 10/17/2020, 3:36 PM

## 2020-10-17 NOTE — Progress Notes (Signed)
Patient transported to MRI & back to room 3M13 on ventilator with no problems.

## 2020-10-17 NOTE — Progress Notes (Signed)
SLP Cancellation Note  Patient Details Name: Arthur [name] MRN: [id number] DOB: Sep 06, 1960   Cancelled treatment:       Reason Eval/Treat Not Completed: Patient not medically ready   Skilynn Durney, Riley Nearing 10/17/2020, 7:46 AM

## 2020-10-18 ENCOUNTER — Inpatient Hospital Stay: Payer: Self-pay

## 2020-10-18 DIAGNOSIS — E43 Unspecified severe protein-calorie malnutrition: Secondary | ICD-10-CM | POA: Insufficient documentation

## 2020-10-18 LAB — CBC
HCT: 31 % — ABNORMAL LOW (ref 39.0–52.0)
Hemoglobin: 10.2 g/dL — ABNORMAL LOW (ref 13.0–17.0)
MCH: 26.8 pg (ref 26.0–34.0)
MCHC: 32.9 g/dL (ref 30.0–36.0)
MCV: 81.6 fL (ref 80.0–100.0)
Platelets: 54 10*3/uL — ABNORMAL LOW (ref 150–400)
RBC: 3.8 MIL/uL — ABNORMAL LOW (ref 4.22–5.81)
RDW: 17.8 % — ABNORMAL HIGH (ref 11.5–15.5)
WBC: 8.4 10*3/uL (ref 4.0–10.5)
nRBC: 0 % (ref 0.0–0.2)

## 2020-10-18 LAB — COMPREHENSIVE METABOLIC PANEL
ALT: 207 U/L — ABNORMAL HIGH (ref 0–44)
AST: 476 U/L — ABNORMAL HIGH (ref 15–41)
Albumin: 2.2 g/dL — ABNORMAL LOW (ref 3.5–5.0)
Alkaline Phosphatase: 299 U/L — ABNORMAL HIGH (ref 38–126)
Anion gap: 11 (ref 5–15)
BUN: 22 mg/dL — ABNORMAL HIGH (ref 6–20)
CO2: 19 mmol/L — ABNORMAL LOW (ref 22–32)
Calcium: 7.8 mg/dL — ABNORMAL LOW (ref 8.9–10.3)
Chloride: 108 mmol/L (ref 98–111)
Creatinine, Ser: 3.33 mg/dL — ABNORMAL HIGH (ref 0.61–1.24)
GFR, Estimated: 21 mL/min — ABNORMAL LOW (ref >60)
Glucose, Bld: 130 mg/dL — ABNORMAL HIGH (ref 70–99)
Potassium: 4.1 mmol/L (ref 3.5–5.1)
Sodium: 138 mmol/L (ref 135–145)
Total Bilirubin: 2.3 mg/dL — ABNORMAL HIGH (ref 0.3–1.2)
Total Protein: 6.1 g/dL — ABNORMAL LOW (ref 6.5–8.1)

## 2020-10-18 LAB — CSF CELL COUNT WITH DIFFERENTIAL
Eosinophils, CSF: 0 % (ref 0–1)
Lymphs, CSF: 51 % (ref 40–80)
Monocyte-Macrophage-Spinal Fluid: 8 % — ABNORMAL LOW (ref 15–45)
RBC Count, CSF: 134500 /mm3 — ABNORMAL HIGH
Segmented Neutrophils-CSF: 41 % — ABNORMAL HIGH (ref 0–6)
Tube #: 1
WBC, CSF: 11 /mm3 (ref 0–5)

## 2020-10-18 LAB — GLUCOSE, CAPILLARY
Glucose-Capillary: 152 mg/dL — ABNORMAL HIGH (ref 70–99)
Glucose-Capillary: 99 mg/dL (ref 70–99)
Glucose-Capillary: 179 mg/dL — ABNORMAL HIGH (ref 70–99)
Glucose-Capillary: 72 mg/dL (ref 70–99)
Glucose-Capillary: 112 mg/dL — ABNORMAL HIGH (ref 70–99)
Glucose-Capillary: 262 mg/dL — ABNORMAL HIGH (ref 70–99)

## 2020-10-18 LAB — PHENYTOIN LEVEL, TOTAL: Phenytoin Lvl: 13.5 ug/mL (ref 10.0–20.0)

## 2020-10-18 LAB — TRIGLYCERIDES: Triglycerides: 485 mg/dL — ABNORMAL HIGH (ref <150)

## 2020-10-18 LAB — PHOSPHORUS
Phosphorus: 4.6 mg/dL (ref 2.5–4.6)
Phosphorus: 3.9 mg/dL (ref 2.5–4.6)

## 2020-10-18 LAB — AMMONIA: Ammonia: 59 umol/L — ABNORMAL HIGH (ref 9–35)

## 2020-10-18 LAB — MAGNESIUM
Magnesium: 2.1 mg/dL (ref 1.7–2.4)
Magnesium: 1.7 mg/dL (ref 1.7–2.4)

## 2020-10-18 LAB — PROCALCITONIN: Procalcitonin: 2.42 ng/mL

## 2020-10-18 LAB — PATHOLOGIST SMEAR REVIEW

## 2020-10-18 MED ORDER — DEXMEDETOMIDINE HCL IN NACL 400 MCG/100ML IV SOLN
0.4000 ug/kg/h | INTRAVENOUS | Status: DC
  Administered 2020-10-18: 0.5 ug/kg/h via INTRAVENOUS
  Administered 2020-10-18: 0.4 ug/kg/h via INTRAVENOUS
  Administered 2020-10-19: 1.2 ug/kg/h via INTRAVENOUS
  Administered 2020-10-19: 0.9 ug/kg/h via INTRAVENOUS
  Filled 2020-10-18 (×4): qty 100

## 2020-10-18 MED ORDER — SODIUM CHLORIDE 0.9% FLUSH: 10.0000 mL | INTRAVENOUS | Status: DC | PRN

## 2020-10-18 MED ORDER — LORAZEPAM 2 MG/ML IJ SOLN
INTRAMUSCULAR | Status: AC
  Filled 2020-10-18: qty 1

## 2020-10-18 MED ORDER — SODIUM CHLORIDE 0.9% FLUSH
10.0000 mL | Freq: Two times a day (BID) | INTRAVENOUS | Status: DC
  Administered 2020-10-18 – 2020-10-23 (×10): 10 mL
  Administered 2020-10-23: 20 mL
  Administered 2020-10-24: 10 mL
  Administered 2020-10-25: 20 mL

## 2020-10-18 MED ORDER — LORAZEPAM 2 MG/ML IJ SOLN
2.0000 mg | INTRAMUSCULAR | Status: AC
  Administered 2020-10-18: 2 mg via INTRAVENOUS

## 2020-10-18 MED ORDER — DEXTROSE 5 % IV SOLN
10.0000 mg/kg | INTRAVENOUS | Status: DC
  Administered 2020-10-18 – 2020-10-19 (×2): 570 mg via INTRAVENOUS
  Filled 2020-10-18 (×3): qty 11.4

## 2020-10-18 NOTE — Progress Notes (Signed)
Spoke with [name]' spouse Ella Jubilee on the phone at [phone]. Updated her on her spouses condition.   Gershon Mussel., MSN, APRN, AGACNP-BC [facility]  10/18/2020 , 5:34 PM  Please see [email or web address] for pager details  If no response, please call [phone] After hours, please call Elink at [phone]

## 2020-10-18 NOTE — Progress Notes (Signed)
Per MD, no wake up assessment until neurology says its okay to turn off sedation.

## 2020-10-18 NOTE — Progress Notes (Signed)
Pharmacy Antibiotic Note  Arthur [name] is a 60 y.o. male admitted on 10/16/2020 as code stroke, concern for meningitis.  Pharmacy has been consulted for vancomycin, ceftriaxone dosing.  HSV pending. CSF culture pending (protein high but lots of RBCs in sample). WBC wnl. Tmax 100.2.   Plan: Adding Acyclovir 10mg /kg (570mg , total body weight) IV every 24 hours Increase LR to 125 ml/hr for renal protection, hydration while on Acyclovir. Continue Linezolid 600 mg IV every 12 hours Continue Ceftriaxone 2g IV every 12 hours Monitor renal function, meningitis workup and ability to narrow  Height: 5\' 1"  (154.9 cm) Weight: 57 kg (125 lb 10.6 oz) IBW/kg (Calculated) : 52.3  Temp (24hrs), Avg:99.9 F (37.7 C), Min:98.42 F (36.9 C), Max:101.12 F (38.4 C)  Recent Labs  Lab 10/16/20 1505 10/16/20 1508 10/16/20 2232 10/17/20 0151 10/17/20 0746 10/18/20 0735  WBC 8.1  --   --  6.8  --  8.4  CREATININE 3.31* 3.50*  --  3.43*  --   --   LATICACIDVEN  --   --  2.2* 1.5  --   --   VANCORANDOM  --   --   --   --  <4  --     Estimated Creatinine Clearance: 17.6 mL/min (A) (by C-G formula based on SCr of 3.43 mg/dL (H)).    Allergies  Allergen Reactions  . Penicillins Hives    Did it involve swelling of the face/tongue/throat, SOB, or low BP? Y Did it involve sudden or severe rash/hives, skin peeling, or any reaction on the inside of your mouth or nose? N Did you need to seek medical attention at a hospital or doctor's office? Y When did it last happen?Over 5 Years Ago If all above answers are "NO", may proceed with cephalosporin use.     10/19/20, PharmD, BCPS, BCCCP Clinical Pharmacist Please refer to [facility] for [facility] numbers 10/18/2020 8:36 AM

## 2020-10-18 NOTE — Procedures (Addendum)
Patient Name:Arthur [name]:[id number] Epilepsy Attending:Davari Lopes Annabelle Harman Referring Physician/Provider:Dr Milon Dikes Duration:10/17/2020 2122 to 10/18/2020 2122  Patient history:60 year old with prior stroke with residual minimal speech deficit, hypertension, alcohol abuse presenting for evaluation of sudden onset of what EMS described as left facial weakness and word finding difficulty.EEG to evaluate for seizure.  Level of alertness:comatose  AEDs during EEG study:LEV, PHT, Propofol  Technical aspects: This EEG study was done with scalp electrodes positioned according to the 10-20 International system of electrode placement. Electrical activity was acquired at a sampling rate of 500Hz  and reviewed with a high frequency filter of 70Hz  and a low frequency filter of 1Hz . EEG data were recorded continuously and digitally stored.   Description: EEG showed continuous generalizedand maximal left frontotemporal region3 to 6 Hz theta-delta slowingadmixed with15 to 18 Hz beta activity distributed symmetrically and diffusely. Sharp waves werealso noted in left frontotemporal region. Hyperventilation and photic stimulation were not performed.   ABNORMALITY - Sharp wave, left frontotemporal region - Continuous slow, generalizedand maximal left frontotemporal region  IMPRESSION: This studyshowed evidence of cortical dysfunction and epileptogenicity arising from left frontotemporal region due to underlying stroke. There is also severe diffuse encephalopathy, nonspecific etiology but likely related to sedation. No seizures were seen during the study.  Ennifer Harston 

## 2020-10-18 NOTE — Progress Notes (Signed)
RT NOTE: attempted SBT on patient this AM however patient's RR decreased.  Patient placed back on full support settings.  Will attempt again once more awake.  Will continue to monitor.

## 2020-10-18 NOTE — Progress Notes (Signed)
Telephone consent obtained from wife for PICC.  Wife wishes to speak with nurse.  Shanda Bumps, RN notified and will return her call once able to do so.

## 2020-10-18 NOTE — Progress Notes (Incomplete)
LTM maint complete - no skin breakdown under: Fp1 F7 A1 [facility] monitored, Event button test confirmed by [facility].   

## 2020-10-18 NOTE — Progress Notes (Signed)
NAME:  Arthur [name], MRN:  [id number], DOB:  [birth year], LOS: 2 ADMISSION DATE:  10/16/2020, CONSULTATION DATE:  10/16/2020 REFERRING MD:  Dr. Gwenlyn Fudge, CHIEF COMPLAINT:  AMS  History of Present Illness:  Arthur [name] is a 60 year old male with prior hx of HTN, HLD, CVA (09/2019 Left MCA- out of window for any intervention w/ residual speech issues), and ETOH abuse who presented as code stroke by EMS with garbled speech and questionable left facial droop.  He was able to follow some commands and moved all extremities on arrival.  Code stroke was initated.  Initial CTH neg.   tPA was started then patient started to have tremulous movements and blood coming from mouth (noted to have cheek laceration) concerning for seizure activity, and given ETOH hx, tPA was therefore stopped.  Suspected to have possible ETOH withdrawal seizures.  He only received 5.1 mg of tPA at 1523 on 5/22. Repeat CT head negative on 5/22.    PCCM consulted for admission. He remains in the [facility]M ICU in critical condition.  Pertinent  Medical History  HTN, HLD, CVA (residual   Significant Hospital Events: Including procedures, antibiotic start and stop dates in addition to other pertinent events   . 5/22 admitted with acute encephalopathy, initially thought to be stroke, CTH neg, tPA bolus 5mg  given, then stopped after developing seizure activity.  Intubated for airway protection/ declining mental status.  Febrile with AKI and Transaminitis. Vanc & ceftriaxone 5/22-5/23  BC> 11-16-1968 ABD> Hepatic steatosis, no renal mass or hydro. . 5/23 EEG> subclinical seizures seen MRI> abnormal diffusion signal involving left hippocampus, seizure effect vs clinical infarct, 5/23 LP culture>, Linezolid & Ceftriaxone >. ECHO> LVEF 55 to 60% Mild LVH, grade 1 diastolic dysfunction. RVSF normal  .   Interim History / Subjective:  On 6/23 prop and 50 mcg fentanyl on exam  Tmax 101.1 BP 118/66 to 161/78  UOP, +1.2L 24 hours, +1L  admit  Unable to obtain subjective evaluation due to patient status   Objective   Blood pressure (!) 175/85, pulse 89, temperature 99.14 F (37.3 C), resp. rate 18, height 5\' 1"  (1.549 m), weight 57 kg, SpO2 100 %.    Vent Mode: PRVC FiO2 (%):  [40 %] 40 % Set Rate:  [18 bmp] 18 bmp Vt Set:  [420 mL] 420 mL PEEP:  [5 cmH20] 5 cmH20 Plateau Pressure:  [11 cmH20-18 cmH20] 11 cmH20   Intake/Output Summary (Last 24 hours) at 10/18/2020 1011 Last data filed at 10/18/2020 0600 Gross per 24 hour  Intake 2684.06 ml  Output 1175 ml  Net 1509.06 ml   Filed Weights   10/16/20 1500 10/17/20 0500  Weight: 57.2 kg 57 kg    Examination: Currently on propofol and fentanyl gtt General:  Ill appearing, no acute distress, in bed HEENT: MM pink/moist, anicteric, trachea midline, ETT, OGT  Neuro: Opens eyes to pain, localizing with right arm, MAE, RASS -2, PERRL 11mm CV: S1S2, NSR, no m/r/g appreciated PULM:  clear in the upper lobes and in the lower lobes, clear thin secretions, chest expansion symmetric GI: soft, bsx4 active, non distended Extremities: warm/dry, no pretibial edema, capillary refill less than 3 seconds  Skin: no rashes or lesions noted   Labs/imaging that I havepersonally reviewed  (right click and "Reselect all SmartList Selections" daily)  CMP- Creat 3.43>3.33, AST 1035>476, ALT 309>207, Ammonia 70>59,  Triglycerides 480>485 CBC: PLT 74>54 CSF: HSV not resulted, 14 WBC in tube 3, 11 in  tube 1, Glucose WNL, 56 protein, No organisms seen, waiting on CSF culture result RS:WNIO day 1 Hepatitis panel: negative  Resolved Hospital Problem list   Oropharyngeal Bleeding Assessment & Plan:   Acute encephalopathy- Seizures vs infection Concern for ETOH withdrawal seizures/ r/o SE R/o CNS process/ infection w/ fever Hx of prior L MCA CVA 09/2019 with residual speech issues (no intervention given he presented out of window) ETOH abuse- last drink, 1 beer 5/21 per wife ? Seizure  vs CNS infection. TMAX 101.1 MRI shows abnormal diffusion signal involving left hippocampus, seizure effect vs clinical infarct. LP completed 5/23 shows 14 WBC in tube 3, 11 in tube 1, Glucose WNL, 56 protein, No organisms seen, waiting on CSF culture result. HSV not resulted. ?HSV. P:  -EEG continuing. Subclinical seizures seen yesterday. Appreciate neurology assistance. Loaded with fosphenytoin and dilantin started. Renally adjusted keppra. Ok by neuro to stop propofol infusion. -On Linezolid and ceftriaxone for empiric CNS infection coverage. Pharmacy consult to add acyclovir for HSV coverage today -SBP goal less than 180 -Stroke workup post neurology -Continue neuroprotective measures- HOB greater than 30, head in neutral position, normonatremia,  -PAD bundle with precedex and fentanyl gtt, goal rass -2, titrate to goal. -Monitor for ETOH withdraw. Currently on propofol. -Alcohol cessation counseling when appropriate  -Continue thiamine and folate  Acute Respiratory Failure with Hypercarbia requiring Mechanical Ventilation Secondary to Acute Encephalopathy. P:  -LTVV strategy with tidal volumes of 4-8 cc/kg ideal body weight -Goal plateau pressures of 30 and driving pressures of 15 -Wean PEEP/FiO2 for SpO2 greater than 92 -Follow intermittent CXR and ABG PRN -Holding on SAT today -PAD as discussed -Continue VAP precautions   AKI Hyponatremia- Resolved NA 134>138, Creat 3.5 peak >3.43> 3.33. Abd US shows no renal mass or hydro. uop past 24.  Suspect due to hypovolemia. AKI improving P:  -Ensure renal perfusion. Goal MAP 65 or greater. -Avoid neprotoxic drugs as possible. -Strict I&O's -Follow up AM creatinine -Increasing LR from 75 to 125 -Monitor and replete electrolytes as indicated  Transaminitis Thrombocytopenia -Initial MELD score 24 with a 19.6% 3 month mortality rate; discriminant score 13.8, RUQ ultrasound shows mild hepatic stenosis. PLT 107>74>. Ammonia 70>59.  AST 1035>476, ALT 309>207 . Hepatitis panel Negative. On linezolid.  P: -AM CMP -Continue lactulose -Monitor platlets on AM CBC -Trend LFTs. Downtrending currently    DM, Hyperglycemia  -BG 85 to 152, A1C 6.8 P:  -Continue SSI. -Blood Glucose goal 140-180.  Fever without leukocytosis -Suspect CNS infection. PCT 3.01>2.88>2,42 TMAX 101.2 P:  -ABX plan as discussed. Empiric Linezolid, ceftriaxone, acyclovir -Follow up BC and CSF cultures   Hx HTN P:  -SBP goal as discussed -D/C cleviprex as it has not been used. Will reinitiate if needed.  Acute Blood Loss Anemia Suspect Post seizureTPA administration, with oropharyngeal bleeding. No active signs of bleeding. P: -monitor for blood loss -Transfuse PRBC if HBG less than 7 -Obtain AM CBC to trend H&H   Best practice (right click and "Reselect all SmartList Selections" daily)  Diet:  Tube Feed  Pain/Anxiety/Delirium protocol (if indicated): Yes (RASS goal -1,-2) VAP protocol (if indicated): Yes DVT prophylaxis: SCD GI prophylaxis: PPI Glucose control:  SSI Yes Central venous access:  Yes, and it is still needed Arterial line:  N/A Foley:  Yes, and it is still needed Mobility:  bed rest  PT consulted: N/A Last date of multidisciplinary goals of care discussion [5/22 with family at bedside] Code Status:  full code Disposition:  ICU  Critical care time: 34 mins     Gershon Mussel., MSN, APRN, AGACNP-BC [facility]  10/18/2020 , 10:11 AM  Please see [email or web address] for pager details  If no response, please call [phone] After hours, please call Elink at [phone]

## 2020-10-18 NOTE — Progress Notes (Signed)
Peripherally Inserted Central Catheter Placement  The IV Nurse has discussed with the patient and/or persons authorized to consent for the patient, the purpose of this procedure and the potential benefits and risks involved with this procedure.  The benefits include less needle sticks, lab draws from the catheter, and the patient may be discharged home with the catheter. Risks include, but not limited to, infection, bleeding, blood clot (thrombus formation), and puncture of an artery; nerve damage and irregular heartbeat and possibility to perform a PICC exchange if needed/ordered by physician.  Alternatives to this procedure were also discussed.  Bard Power PICC patient education guide, fact sheet on infection prevention and patient information card has been provided to patient /or left at bedside.    PICC Placement Documentation  PICC Triple Lumen 10/18/20 PICC Right Basilic 37 cm 0 cm (Active)  Indication for Insertion or Continuance of Line Vasoactive infusions 10/18/20 1643  Exposed Catheter (cm) 0 cm 10/18/20 1643  Site Assessment Clean;Dry;Intact 10/18/20 1643  Lumen #1 Status Flushed;Saline locked;Blood return noted 10/18/20 1643  Lumen #2 Status Flushed;Saline locked;Blood return noted 10/18/20 1643  Lumen #3 Status Flushed;Saline locked;Blood return noted 10/18/20 1643  Dressing Type Transparent;Securing device 10/18/20 1643  Dressing Status Clean;Dry;Intact 10/18/20 1643  Antimicrobial disc in place? Yes 10/18/20 1643  Dressing Intervention New dressing 10/18/20 1643  Dressing Change Due 10/25/20 10/18/20 1643       Annett Fabian 10/18/2020, 4:44 PM

## 2020-10-18 NOTE — Progress Notes (Signed)
PT Cancellation Note  Patient Details Name: Arthur [name] MRN: [id number] DOB: [birth year]   Cancelled Treatment:    Reason Eval/Treat Not Completed: Active bedrest order.  Pt still on strict bedrest per order.  MD - please update activity order if mobility is desired.  Thanks.    Bevelyn Buckles 10/18/2020, 8:57 AM Solaris Kram M,PT Acute Rehab Services [phone] (pager)

## 2020-10-18 NOTE — Progress Notes (Signed)
Subjective: No acute events overnight.  [name] states patient had some alcohol (maybe 1 can of beer) previous night.  He woke up in the morning and reported not feeling well, did not specifically report any headache or vision changes.  Wife told him to have some water and stay inside because it was very hot outside.  When she came back home he was sitting outside and later had a convulsion.  Wife denies history of seizures in the past.  Denies any recent medication changes, travel, fever, tick bite.  ROS: Unable to obtain due to poor mental status  Examination  Vital signs in last 24 hours: Temp:  [98.42 F (36.9 C)-101.12 F (38.4 C)] 98.5 F (36.9 C) (05/24 1205) Pulse Rate:  [69-111] 111 (05/24 1200) Resp:  [10-19] 14 (05/24 1200) BP: (126-175)/(66-86) 151/86 (05/24 1200) SpO2:  [90 %-100 %] 99 % (05/24 1200) FiO2 (%):  [40 %] 40 % (05/24 1128)  General: lying in bed, not in apparent distress CVS: pulse-normal rate and rhythm RS: Intubated, coarse breath sounds bilaterally Extremities: normal, warm Neuro: On propofol @5mcg /hr, opens eyes after repeated noxious stimulation, does not follow commands, PERLA, corneal reflex intact, gag reflex intact, moving all extremities symmetrically to noxious stimulation   Basic Metabolic Panel: Recent Labs  Lab 10/16/20 1505 10/16/20 1508 10/16/20 1729 10/16/20 2007 10/16/20 2232 10/17/20 0151 10/17/20 0746 10/17/20 1654 10/18/20 0735  NA 129* 132* 136 136  --  134*  --   --  138  K 4.6 4.7 5.0 4.6  --  4.2  --   --  4.1  CL 98 104  --   --   --  107  --   --  108  CO2 16*  --   --   --   --  19*  --   --  19*  GLUCOSE 188* 181*  --   --   --  92  --   --  130*  BUN 33* 37*  --   --   --  29*  --   --  22*  CREATININE 3.31* 3.50*  --   --   --  3.43*  --   --  3.33*  CALCIUM 7.7*  --   --   --   --  7.2*  --   --  7.8*  MG  --   --   --   --  1.6*  --  2.7* 2.4 2.1  PHOS  --   --   --   --  4.5  --   --  4.9* 4.6     CBC: Recent Labs  Lab 10/16/20 1505 10/16/20 1508 10/16/20 1729 10/16/20 2007 10/17/20 0151 10/18/20 0735  WBC 8.1  --   --   --  6.8 8.4  NEUTROABS 6.8  --   --   --   --   --   HGB 12.5* 15.3 13.6 12.9* 10.6* 10.2*  HCT 37.1* 45.0 40.0 38.0* 30.7* 31.0*  MCV 78.9*  --   --   --  77.3* 81.6  PLT 107*  --   --   --  74* 54*     Coagulation Studies: Recent Labs    10/16/20 1505 10/17/20 0151  LABPROT 15.3* 17.1*  INR 1.2 1.4*    Imaging MR Brain w and wo contrast 10/17/2020: Abnormal diffusion signal involving the left hippocampus. Primary differential considerations are seizure effect and subacute infarct. Multiple chronic infarcts.  ASSESSMENT AND PLAN: 60 year old male with history of stroke, alcohol use who presented with sudden onset speech disturbance, left facial droop status post tPA.  Also noted to be febrile and had seizures.  Seizure Fever Suspected meningitis Acute encephalopathy, multifactorial Chronic stroke Alcohol use disorder -LTM EEG showed no further seizures overnight -Continues to have low-grade fever -Seizures likely secondary to underlying stroke, alcohol use, possible meningitis. CSF pleocytosis could be secondary to meningitis versus seizure related.  HSV ordered and pending  Recommendations -DC propofol this morning as no further seizures overnight.  Can use Precedex/fentanyl for sedation -Continue Keppra 500 mg twice daily (renally adjusted) as well as Dilantin 100 mg 3 times daily -Continue LTM EEG under patient remains seizure-free and has been off sedation -On acyclovir to cover for possible HSV meningitis.  I called [facility], will likely take about 2 days with the results. -Continue seizure precautions -Management of rest of comorbidities per primary team  CRITICAL CARE Performed by: Charlsie Quest   Total critical care time:  Critical care time was exclusive of separately billable procedures and  treating other patients.  Critical care was necessary to treat or prevent imminent or life-threatening deterioration.  Critical care was time spent personally by me on the following activities: development of treatment plan with patient and/or surrogate as well as nursing, discussions with consultants, evaluation of patient's response to treatment, examination of patient, obtaining history from patient or surrogate, ordering and performing treatments and interventions, ordering and review of laboratory studies, ordering and review of radiographic studies, pulse oximetry and re-evaluation of patient's condition.    Lindie Spruce Epilepsy [facility] For questions after 5pm please refer to AMION to reach the Neurologist on call

## 2020-10-18 NOTE — Progress Notes (Signed)
eLink Physician-Brief Progress Note Patient Name: Arthur [name] DOB: 1961-03-20 MRN: [id number]   Date of Service  10/18/2020  HPI/Events of Note  Agitation - Patient reaching for ETT. Nursing request for bilateral soft wrist restraints.  eICU Interventions  Plan: 1. Bilateral soft wrist restraints X 10 hours.     Intervention Category Major Interventions: Delirium, psychosis, severe agitation - evaluation and management  Lenell Antu 10/18/2020, 11:37 PM

## 2020-10-18 NOTE — Progress Notes (Signed)
OT Cancellation Note  Patient Details Name: Arthur [name] MRN: [id number] DOB: 03/02/1961   Cancelled Treatment:    Reason Eval/Treat Not Completed: Patient not medically ready;Active bedrest order. Will follow and see as able/appropriate.   Barry Brunner, OT Acute Rehabilitation Services Pager [phone] Office [phone]   Chancy Milroy 10/18/2020, 12:15 PM

## 2020-10-18 NOTE — Progress Notes (Signed)
eLink Physician-Brief Progress Note Patient Name: Arthur [name] DOB: 11-Nov-1960 MRN: [id number]   Date of Service  10/18/2020  HPI/Events of Note  Unable to get labs from arms or hands.  eICU Interventions  May stick feet for lab studies.      Intervention Category Major Interventions: Other:  Lenell Antu 10/18/2020, 6:47 AM

## 2020-10-19 ENCOUNTER — Ambulatory Visit (INDEPENDENT_AMBULATORY_CARE_PROVIDER_SITE_OTHER): Admitting: Family Medicine

## 2020-10-19 ENCOUNTER — Encounter

## 2020-10-19 ENCOUNTER — Ambulatory Visit: Attending: Family Medicine

## 2020-10-19 ENCOUNTER — Encounter (INDEPENDENT_AMBULATORY_CARE_PROVIDER_SITE_OTHER): Admitting: Family Medicine

## 2020-10-19 ENCOUNTER — Other Ambulatory Visit

## 2020-10-19 VITALS — BP 134/74 | HR 80 | Temp 98.4°F | Ht 69.0 in | Wt 173.0 lb

## 2020-10-19 DIAGNOSIS — L8990 Pressure ulcer of unspecified site, unspecified stage: Secondary | ICD-10-CM | POA: Insufficient documentation

## 2020-10-19 DIAGNOSIS — C4481 Basal cell carcinoma of overlapping sites of skin: Secondary | ICD-10-CM

## 2020-10-19 DIAGNOSIS — Z0000 Encounter for general adult medical examination without abnormal findings: Secondary | ICD-10-CM

## 2020-10-19 LAB — LIPID PANEL
Cholesterol: 201 mg/dL — ABNORMAL HIGH (ref <=200)
HDL cholesterol: 45 mg/dL (ref >=40)
LDL cholesterol, calculated: 114 mg/dL (ref 0–130)
Triglycerides: 211 mg/dL — ABNORMAL HIGH (ref <=150)

## 2020-10-19 LAB — BASIC METABOLIC PANEL
Anion Gap: 8 mmol/L (ref 3–14)
BUN: 14 mg/dL (ref 6–24)
CO2 (Bicarbonate): 27 mmol/L (ref 20–32)
Calcium: 9.8 mg/dL (ref 8.5–10.5)
Chloride: 104 mmol/L (ref 98–110)
Creatinine: 0.84 mg/dL (ref 0.55–1.30)
Glucose: 99 mg/dL (ref 70–110)
Potassium: 4 mmol/L (ref 3.6–5.2)
Sodium: 139 mmol/L (ref 135–146)
eGFRcr: 102 mL/min/{1.73_m2} (ref >=60)

## 2020-10-19 LAB — PSA TOTAL, SCREEN: PSA: 1.09 ng/mL (ref 0.00–4.00)

## 2020-10-19 LAB — COMPREHENSIVE METABOLIC PANEL
ALT: 134 U/L — ABNORMAL HIGH (ref 0–44)
AST: 262 U/L — ABNORMAL HIGH (ref 15–41)
Albumin: 1.7 g/dL — ABNORMAL LOW (ref 3.5–5.0)
Alkaline Phosphatase: 218 U/L — ABNORMAL HIGH (ref 38–126)
Anion gap: 5 (ref 5–15)
BUN: 14 mg/dL (ref 6–20)
CO2: 23 mmol/L (ref 22–32)
Calcium: 7.7 mg/dL — ABNORMAL LOW (ref 8.9–10.3)
Chloride: 114 mmol/L — ABNORMAL HIGH (ref 98–111)
Creatinine, Ser: 2.48 mg/dL — ABNORMAL HIGH (ref 0.61–1.24)
GFR, Estimated: 30 mL/min — ABNORMAL LOW (ref >60)
Glucose, Bld: 120 mg/dL — ABNORMAL HIGH (ref 70–99)
Potassium: 3.4 mmol/L — ABNORMAL LOW (ref 3.5–5.1)
Sodium: 142 mmol/L (ref 135–145)
Total Bilirubin: 1.9 mg/dL — ABNORMAL HIGH (ref 0.3–1.2)
Total Protein: 5.2 g/dL — ABNORMAL LOW (ref 6.5–8.1)

## 2020-10-19 LAB — CBC
HCT: 26.1 % — ABNORMAL LOW (ref 39.0–52.0)
Hemoglobin: 8.4 g/dL — ABNORMAL LOW (ref 13.0–17.0)
MCH: 26.6 pg (ref 26.0–34.0)
MCHC: 32.2 g/dL (ref 30.0–36.0)
MCV: 82.6 fL (ref 80.0–100.0)
Platelets: 39 10*3/uL — ABNORMAL LOW (ref 150–400)
RBC: 3.16 MIL/uL — ABNORMAL LOW (ref 4.22–5.81)
RDW: 17.4 % — ABNORMAL HIGH (ref 11.5–15.5)
WBC: 7.2 10*3/uL (ref 4.0–10.5)
nRBC: 0 % (ref 0.0–0.2)

## 2020-10-19 LAB — GLUCOSE, CAPILLARY
Glucose-Capillary: 100 mg/dL — ABNORMAL HIGH (ref 70–99)
Glucose-Capillary: 102 mg/dL — ABNORMAL HIGH (ref 70–99)
Glucose-Capillary: 124 mg/dL — ABNORMAL HIGH (ref 70–99)
Glucose-Capillary: 128 mg/dL — ABNORMAL HIGH (ref 70–99)
Glucose-Capillary: 144 mg/dL — ABNORMAL HIGH (ref 70–99)
Glucose-Capillary: 184 mg/dL — ABNORMAL HIGH (ref 70–99)

## 2020-10-19 LAB — TECHNOLOGIST SMEAR REVIEW

## 2020-10-19 LAB — PHOSPHORUS: Phosphorus: 3.2 mg/dL (ref 2.5–4.6)

## 2020-10-19 LAB — MAGNESIUM: Magnesium: 1.7 mg/dL (ref 1.7–2.4)

## 2020-10-19 MED ORDER — FAMOTIDINE 40 MG/5ML PO SUSR
10.0000 mg | Freq: Every day | ORAL | Status: DC
  Administered 2020-10-19: 10.4 mg
  Filled 2020-10-19: qty 2.5

## 2020-10-19 MED ORDER — MIDAZOLAM HCL 2 MG/2ML IJ SOLN
INTRAMUSCULAR | Status: AC
  Administered 2020-10-19: 2 mg
  Filled 2020-10-19: qty 2

## 2020-10-19 MED ORDER — MIDAZOLAM HCL 2 MG/2ML IJ SOLN: 2.0000 mg | INTRAMUSCULAR | Status: DC | PRN

## 2020-10-19 MED ORDER — DOCUSATE SODIUM 50 MG/5ML PO LIQD: 100.0000 mg | Freq: Two times a day (BID) | ORAL | Status: DC | PRN

## 2020-10-19 MED ORDER — AMLODIPINE BESYLATE 10 MG PO TABS
10.0000 mg | ORAL_TABLET | Freq: Every day | ORAL | Status: DC
  Administered 2020-10-19: 10 mg
  Filled 2020-10-19: qty 1

## 2020-10-19 MED ORDER — POLYETHYLENE GLYCOL 3350 17 G PO PACK: 17.0000 g | PACK | Freq: Every day | ORAL | Status: DC

## 2020-10-19 MED ORDER — FENTANYL CITRATE (PF) 100 MCG/2ML IJ SOLN
50.0000 ug | INTRAMUSCULAR | Status: DC | PRN
  Administered 2020-10-19: 100 ug via INTRAVENOUS
  Administered 2020-10-19: 50 ug via INTRAVENOUS
  Administered 2020-10-19 – 2020-10-20 (×3): 100 ug via INTRAVENOUS
  Filled 2020-10-19 (×5): qty 2

## 2020-10-19 MED ORDER — SODIUM CHLORIDE 0.9 % IV SOLN
50.0000 mg | Freq: Two times a day (BID) | INTRAVENOUS | Status: DC
  Administered 2020-10-19 – 2020-10-22 (×7): 50 mg via INTRAVENOUS
  Filled 2020-10-19 (×9): qty 5

## 2020-10-19 MED ORDER — DEXMEDETOMIDINE HCL IN NACL 400 MCG/100ML IV SOLN
0.0000 ug/kg/h | INTRAVENOUS | Status: DC
  Administered 2020-10-19 – 2020-10-20 (×4): 1.2 ug/kg/h via INTRAVENOUS
  Filled 2020-10-19 (×3): qty 100

## 2020-10-19 MED ORDER — FENTANYL CITRATE (PF) 100 MCG/2ML IJ SOLN
50.0000 ug | INTRAMUSCULAR | Status: DC | PRN
Start: 2020-10-19 — End: 2020-10-20

## 2020-10-19 MED ORDER — POTASSIUM CHLORIDE 20 MEQ PO PACK
40.0000 meq | PACK | Freq: Once | ORAL | Status: AC
  Administered 2020-10-19: 40 meq
  Filled 2020-10-19: qty 2

## 2020-10-19 MED ORDER — SENNOSIDES-DOCUSATE SODIUM 8.6-50 MG PO TABS
1.0000 | ORAL_TABLET | Freq: Every evening | ORAL | Status: DC | PRN
Start: 2020-10-19 — End: 2020-10-25

## 2020-10-19 MED ORDER — DOCUSATE SODIUM 50 MG/5ML PO LIQD
100.0000 mg | Freq: Two times a day (BID) | ORAL | Status: DC
  Administered 2020-10-19: 100 mg
  Filled 2020-10-19: qty 10

## 2020-10-19 MED ORDER — MAGNESIUM SULFATE 2 GM/50ML IV SOLN
2.0000 g | Freq: Once | INTRAVENOUS | Status: AC
  Administered 2020-10-19: 2 g via INTRAVENOUS
  Filled 2020-10-19: qty 50

## 2020-10-19 MED ORDER — MIDAZOLAM HCL 2 MG/2ML IJ SOLN
2.0000 mg | INTRAMUSCULAR | Status: DC | PRN
  Administered 2020-10-19 – 2020-10-20 (×3): 2 mg via INTRAVENOUS
  Filled 2020-10-19 (×3): qty 2

## 2020-10-19 MED ORDER — HYDRALAZINE HCL 20 MG/ML IJ SOLN
10.0000 mg | INTRAMUSCULAR | Status: AC | PRN
  Administered 2020-10-19 (×2): 10 mg via INTRAVENOUS
  Filled 2020-10-19 (×5): qty 1

## 2020-10-19 MED ORDER — POLYETHYLENE GLYCOL 3350 17 G PO PACK: 17.0000 g | PACK | Freq: Every day | ORAL | Status: DC | PRN

## 2020-10-19 NOTE — Progress Notes (Signed)
eLink Physician-Brief Progress Note Patient Name: Arthur [name] DOB: 25-May-1961 MRN: [id number]   Date of Service  10/19/2020  HPI/Events of Note  Agitation - Nursing request to renew restraint orders.   eICU Interventions  Will renew bilateral soft wrist restraint orders X 13 hours.     Intervention Category Major Interventions: Delirium, psychosis, severe agitation - evaluation and management  Lenell Antu 10/19/2020, 7:41 PM

## 2020-10-19 NOTE — Progress Notes (Signed)
NP notified of small red blood in OG tube after the patient had a coughing fit and was moving a lot when he woke up on sedation. NP came to bedside to assess patient and gave verbal order to hold suction and tube feed and clamp completely until 2200. At 2200 RN to hook OGT back to suction. If, at that time, the patient has a lot of output then call provider. Also if there is NOT a lot of output place the patient back on trickle feeds until day shift.

## 2020-10-19 NOTE — Progress Notes (Signed)
EEG maintenance complete No skin breakdown at Fp1 Fp2 A2

## 2020-10-19 NOTE — Progress Notes (Signed)
Subjective: Arthur [name]. No clinical seizures overnight.  ROS: Unable to obtain due to poor mental status  Examination  Vital signs in last 24 hours: Temp:  [95.9 F (35.5 C)-100.4 F (38 C)] 97.9 F (36.6 C) (05/25 0744) Pulse Rate:  [61-115] 74 (05/25 1100) Resp:  [17-21] 21 (05/25 1100) BP: (120-166)/(70-112) 147/86 (05/25 1100) SpO2:  [100 %] 100 % (05/25 1145) FiO2 (%):  [30 %-40 %] 30 % (05/25 1145)  General: lying in bed,not in apparent distress CVS: pulse-normal rate and rhythm YF:VCBSWHQPR, coarse breath sounds bilaterally Extremities: normal,warm Neuro:doesn't opens eyes after repeated noxious stimulation, does not follow commands, PERLA, corneal reflex intact, gag reflex intact, moving all extremities symmetrically to noxious stimulation  Basic Metabolic Panel: Recent Labs  Lab 10/16/20 1505 10/16/20 1505 10/16/20 1508 10/16/20 1729 10/16/20 2007 10/16/20 2232 10/17/20 0151 10/17/20 0746 10/17/20 1654 10/18/20 0735 10/18/20 2049 10/19/20 0522  NA 129*  --  132* 136 136  --  134*  --   --  138  --  142  K 4.6  --  4.7 5.0 4.6  --  4.2  --   --  4.1  --  3.4*  CL 98  --  104  --   --   --  107  --   --  108  --  114*  CO2 16*  --   --   --   --   --  19*  --   --  19*  --  23  GLUCOSE 188*  --  181*  --   --   --  92  --   --  130*  --  120*  BUN 33*  --  37*  --   --   --  29*  --   --  22*  --  14  CREATININE 3.31*  --  3.50*  --   --   --  3.43*  --   --  3.33*  --  2.48*  CALCIUM 7.7*  --   --   --   --   --  7.2*  --   --  7.8*  --  7.7*  MG  --    < >  --   --   --  1.6*  --  2.7* 2.4 2.1 1.7 1.7  PHOS  --   --   --   --   --  4.5  --   --  4.9* 4.6 3.9 3.2   < > = values in this interval not displayed.    CBC: Recent Labs  Lab 10/16/20 1505 10/16/20 1508 10/16/20 1729 10/16/20 2007 10/17/20 0151 10/18/20 0735 10/19/20 0522  WBC 8.1  --   --   --  6.8 8.4 7.2  NEUTROABS 6.8  --   --   --   --   --   --   HGB 12.5*   < > 13.6 12.9* 10.6*  10.2* 8.4*  HCT 37.1*   < > 40.0 38.0* 30.7* 31.0* 26.1*  MCV 78.9*  --   --   --  77.3* 81.6 82.6  PLT 107*  --   --   --  74* 54* 39*   < > = values in this interval not displayed.     Coagulation Studies: Recent Labs    10/16/20 1505 10/17/20 0151  LABPROT 15.3* 17.1*  INR 1.2 1.4*    Imaging No new imaging overnight  ASSESSMENT AND PLAN: 60 year old male  with history of stroke, alcohol use who presented with sudden onset speech disturbance, left facial droop status post tPA. Also noted to be febrile and had seizures.  Seizure Suspected meningitis Acute encephalopathy, multifactorial Chronic stroke Alcohol use disorder Thrombocytopenia -LTM EEG showed no further seizures overnight -Seizures likely secondary to underlying stroke, alcohol use, possible meningitis. CSF pleocytosis could be secondary to meningitis versus seizure related.  HSV ordered and pending  Recommendations - Although less likely, will switch dilantin to vimpat 50mg  BID due to worsening thrombocytopenia -Continue Keppra 500 mg twice daily (renally adjusted). - Discontinue LTM EEG as patient did not have any seizures after weaning sedation -On acyclovir to cover for possible HSV meningitis.  We will continue antibiotics and acyclovir for the 24 hours and then start narrowing coverage if patient remains afebrile -Continue seizure precautions -Management of rest of comorbidities per primary team  I have spent a total of  35  minutes with the patient reviewing hospital notes,  test results, labs and examining the patient as well as establishing an assessment and plan that was discussed personally with Dr. .  > 50% of time was spent in direct patient care.    Delton Coombes Epilepsy [facility] For questions after 5pm please refer to AMION to reach the Neurologist on call

## 2020-10-19 NOTE — Progress Notes (Signed)
Nutrition Follow-up  DOCUMENTATION CODES:   Severe malnutrition in context of social or environmental circumstances  INTERVENTION:   When able to resume tube feeds, recommend: - Start Vital 1.5 @ 25 ml/hr and advance by 10 ml a 4 hours to goal rate of 45 (1080 ml/day) - ProSource TF 45 ml daily  Recommended tube feeding regimen would provide 1660 kcal, 84 grams of protein, and 825 ml of H2O.  NUTRITION DIAGNOSIS:   Severe Malnutrition related to social / environmental circumstances (EtOH abuse) as evidenced by severe muscle depletion,severe fat depletion.  Ongoing, being addressed via TF  GOAL:   Patient will meet greater than or equal to 90% of their needs  Met via TF  MONITOR:   Vent status,Labs,Weight trends,TF tolerance  REASON FOR ASSESSMENT:   Ventilator,Consult Enteral/tube feeding initiation and management  ASSESSMENT:   60 year old male who presented to the ED on 5/22 with aphasia, left facial droop. PMH of HTN, EtOH abuse, L MCA CVA. Pt was receiving tPA when he developed seizures. Pt required intubation for airway protection.  5/23 - lumbar puncture  Discussed pt with RN and during ICU rounds. Pt currently tolerating SBT. Tube feeds were infusing at goal rate at time of RD visit. Pt is now off propofol.  Later spoke with RN who reports pt with large volume emesis. Tube feeds turned off and OG tube connected to low intermittent suction. RD to leave tube feeding recommendations for use when able to resume tube feeds.  Per CCM NP, no plan for post-pyloric Cortrak at this time given thrombocytopenia.  Admit weight: 57.2 kg Current weight: 57 kg  Patient is currently intubated on ventilator support MV: 7.9 L/min Temp (24hrs), Avg:98.5 F (36.9 C), Min:95.9 F (35.5 C), Max:100.22 F (37.9 C)  Drips: Precedex LR: 125 ml/hr  Medications reviewed and include: pepcid, folic acid, SSI q 4 hours, lactulose, thiamine, IV abx, keppra  Labs reviewed:  potassium 3.4, creatinine 2.48, elevated LFTs, hemoglobin 8.4 CBG's: 72-262 x 24 hours  UOP: 1925 ml x 24 hours I/O's: +6.4 L since admit  Diet Order:   Diet Order            Diet NPO time specified  Diet effective now                 EDUCATION NEEDS:   Not appropriate for education at this time  Skin:  Skin Assessment: Reviewed RN Assessment  Last BM:  10/19/20 large type 7  Height:   Ht Readings from Last 1 Encounters:  10/16/20 $RemoveB'5\' 1"'veKaARcx$  (1.549 m)    Weight:   Wt Readings from Last 1 Encounters:  10/17/20 57 kg    BMI:  Body mass index is 23.74 kg/m.  Estimated Nutritional Needs:   Kcal:  1650  Protein:  80-100 grams  Fluid:  >/= 1.6 L    Gustavus Bryant, MS, RD, LDN Inpatient Clinical Dietitian Please see AMiON for contact information.

## 2020-10-19 NOTE — Progress Notes (Signed)
SLP Cancellation Note  Patient Details Name: Arthur [name] MRN: [id number] DOB: 1960/12/04   Cancelled treatment:       Reason Eval/Treat Not Completed: Patient not medically ready   Chalyn Amescua, Riley Nearing 10/19/2020, 9:57 AM

## 2020-10-19 NOTE — Progress Notes (Signed)
PT Cancellation Note  Patient Details Name: Arthur [name] MRN: [id number] DOB: [birth year]   Cancelled Treatment:    Reason Eval/Treat Not Completed: Active bedrest order;Other (comment) (Chat with Dr. Delton Coombes regarding appropriateness for therapy.  Dr. Delton Coombes states they will reconsult when pt is more appropriate.)   Bevelyn Buckles 10/19/2020, 9:02 AM Analiese Krupka M,PT Acute Rehab Services [phone] (pager)

## 2020-10-19 NOTE — Progress Notes (Signed)
[facility]  [facility]  [address]  [address] [place] [address]  Dept: [phone]  Dept Fax: [phone]     Patient ID: Bobby Beltran is a 60 y.o. male who presents for Annual Exam.    Subjective   Doing well in general.    Sleeps fairly well, somewhat stressed by work.  Walks the [place] and also works out on [employer] every other day.       Patient Active Problem List   Diagnosis   ? Diverticulosis   ? Hx of testicular cancer   ? Seasonal allergies   ? Basal cell carcinoma (BCC) of overlapping sites of skin     Current Outpatient Medications   Medication Instructions   ? cholecalciferol, vitamin D3, (Vitamin D-3) 25 MCG (1000 UT) tablet Take one tab by mouth daily for bones/low vitamin D   ? multivitamin capsule one by mouth daily for health     No Known Allergies    Objective   Visit Vitals  BP 134/74   Pulse 80   Temp 36.9 ?C (98.4 ?F)   Ht 1.753 m   Wt 78.5 kg   SpO2 98%   BMI 25.55 kg/m?   BSA 1.95 m?       Physical Exam  Constitutional:       Appearance: He is normal weight.   HENT:      Head: Normocephalic.      Right Ear: Tympanic membrane normal.      Left Ear: Tympanic membrane normal.      Nose: Nose normal.   Eyes:      Pupils: Pupils are equal, round, and reactive to light.   Cardiovascular:      Rate and Rhythm: Normal rate and regular rhythm.      Pulses: Normal pulses.      Heart sounds: Normal heart sounds.   Pulmonary:      Breath sounds: Normal breath sounds.   Abdominal:      Palpations: Abdomen is soft.   Genitourinary:     Penis: Normal.       Testes: Normal.   Skin:     General: Skin is warm.   Neurological:      Mental Status: He is alert.         Assessment/Plan   Garon was seen today for annual exam.  Routine general medical examination at a health care facility  Comments:  Encouraged healthy lifestyle.    Orders:  -     Colonoscopy; Future  -     Basic metabolic panel; Future  -     Lipid panel; Future  -     PSA, screen; Future  Hx of  testicular cancer  Comments:  No recurrence.    Orders:  -     Basic metabolic panel; Future  -     Lipid panel; Future  -     PSA, screen; Future  Basal cell carcinoma (BCC) of overlapping sites of skin  -     Basic metabolic panel; Future  -     Lipid panel; Future  -     PSA, screen; Future

## 2020-10-19 NOTE — Progress Notes (Signed)
OT Cancellation Note  Patient Details Name: Arthur [name] MRN: [id number] DOB: 1960-11-30   Cancelled Treatment:    Reason Eval/Treat Not Completed: Patient not medically ready;Active bedrest order (Chat with Dr. Delton Coombes regarding appropriateness for therapy. Dr. Delton Coombes states they will reconsult when pt is more appropriate.) OT to sign off at this time.   Kallie Edward OTR/L Supplemental OT, Department of rehab services [phone]  Arthur Sollenberger R H. 10/19/2020, 12:12 PM

## 2020-10-19 NOTE — Progress Notes (Signed)
LTM EEG discontinued - RN made aware of open blister at fp1

## 2020-10-19 NOTE — Progress Notes (Signed)
NAME:  Arthur [name], MRN:  [id number], DOB:  [birth year], LOS: 3 ADMISSION DATE:  10/16/2020, CONSULTATION DATE:  10/16/2020 REFERRING MD:  Dr. Gwenlyn Fudge, CHIEF COMPLAINT:  AMS  History of Present Illness:  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  He was able to follow some commands and moved all extremities on arrival.  Code stroke was initated.  Initial CTH neg.   tPA was started then patient started to have tremulous movements and blood coming from mouth (noted to have cheek laceration) concerning for seizure activity, and given ETOH hx, tPA was therefore stopped.  Suspected to have possible ETOH withdrawal seizures.  He only received 5.1 mg of tPA at 1523 on 5/22. Repeat CT head negative on 5/22.    PCCM consulted for admission. He remains in the 54M ICU in critical condition.  Pertinent  Medical History  HTN, HLD, CVA (residual speech issues)  Significant Hospital Events: Including procedures, antibiotic start and stop dates in addition to other pertinent events   . 5/22 admitted with acute encephalopathy, initially thought to be stroke, CTH neg, tPA bolus 5mg  given, then stopped after developing seizure activity.  Intubated for airway protection/ declining mental status.  Febrile with AKI and Transaminitis. Vanc & ceftriaxone 5/22-5/23  BC> 11-16-1968 ABD> Hepatic steatosis, no renal mass or hydro. . 5/23 EEG> subclinical seizures seen MRI> abnormal diffusion signal involving left hippocampus, seizure effect vs clinical infarct. ECHO> LVEF 55 to 60% Mild LVH, grade 1 diastolic dysfunction. RVSF 5/23 LP culture>> CSF HSV>> , Linezolid & Ceftriaxone > . 5/24 Acyclovir started, Hepatitis panel negative .   Interim History / Subjective:  On exam on 6/24 fentanyl and 0.5mg  dex.  Agitated  overnight requiring restraints and 1 dose of ativan  Tmax 100.5, SBP 123/75 to 175/85  1.9L uop, +2.4 L yesterday, +3.2 L admit  Intubated/sedated  Unable to obtain subjective evaluation due to patient status  Objective   Blood pressure (!) 147/86, pulse 74, temperature 97.9 F (36.6 C), temperature source Axillary, resp. rate (!) 21, height 5\' 1"  (1.549 m), weight 57 kg, SpO2 100 %.    Vent Mode: PSV;CPAP FiO2 (%):  [40 %] 40 % Set Rate:  [18 bmp] 18 bmp Vt Set:  [420 mL] 420 mL PEEP:  [5 cmH20] 5 cmH20 Pressure Support:  [12 cmH20] 12 cmH20 Plateau Pressure:  [15 cmH20] 15 cmH20   Intake/Output Summary (Last 24 hours) at 10/19/2020 1136 Last data filed at 10/19/2020 1100 Gross per 24 hour  Intake 6163.56 ml  Output 1470 ml  Net 4693.56 ml   Filed Weights   10/16/20 1500 10/17/20 0500  Weight: 57.2 kg 57 kg    Examination: General:  In bed, sedated, no acute distress HEENT: MM pink/moist, anicteric, trachea midline, ETT, OGT  Neuro: MAE, localizes with right hand, no eye opening, +corneal, cough, gag, RASS -2, PERRL88mm CV: S1S2, NSR, no m/r/g appreciated PULM:  Clear in the upper lobes, clear in the lower lobes, clear secretions, chest expansion symmetric GI: soft, bsx4 active, nontender  Extremities: warm/dry, no pretibial edema, capillary refill less than 3 seconds  Skin: no rashes or lesions appreciated  Labs/imaging that I havepersonally reviewed  (right click and "Reselect all SmartList Selections" daily)  CBC, CMP, BC, CSF  cultures  Resolved Hospital Problem list   Oropharyngeal Bleeding Hyponatremia Assessment & Plan:   Acute encephalopathy- Seizures vs infection R/o CNS process/ infection w/ fever Hx of prior L MCA CVA 09/2019 with residual speech issues (no intervention given he presented out of window) ETOH abuse- last drink, 1 beer 5/21 per wife ? Seizure vs CNS infection. Seizures seen on early EEGs. TMAX 100.58  LP completed 5/23 shows 14 WBC in  tube 3, 11 in tube 1, Glucose WNL, 56 protein, No organisms seen on gram stain, still waiting on CSF culture result. HSV not resulted. ?HSV P: -EEG per neuro showed no seizures past 24 hours. Appreciate neurology assistance. Was on dilantin and keppra for seizure PPX, switched to vimpat and keppra with thrombocytopenia.  -Continue lineazolid/ceftriaxone/acyclovir for empiric meningitic coverage. Will follow CSF cultures for 24 more hours (48 total) and consider stopping tomorrow. -SBP goal less than 140. Resuming some home antihypertensives. Amlodipine 10mg  daily. -Continue neuro protective measures: normonatremia, hob greater than 30, head in neutral position -PAD bundle with precedex and fentanyl. Goal RASS 0. CPOT 0-2. Titrate medication to goal. -Daily SAT -Monitor for ETOH withdraw. Day 4 post last ETOH. -Stroke workup per neurology -Continue thiamine and folate -Alcohol cessation education when able -BC NGTD day 2-3, CSF HSV pending, CSF culture pending. Follow up.   Acute Respiratory Failure with Hypercarbia requiring Mechanical Ventilation Secondary to Acute Encephalopathy. P:  -LTVV strategy with tidal volumes of 4-8 cc/kg ideal body weight -Goal plateau pressures of 30 and driving pressures of 15 -Wean PEEP/FiO2 for SpO2 between 92-98% -Follow intermittent CXR and ABG PRN -Daily SAT/SBT. Tolerating SBT currently -Continue VAP bundle   AKI-Improving Creat 3.5 peak >3.43> 3.33>2.48. Abd shows no renal mass or hydro. 1.9L uop past 24.  Suspect due to hypovolemia initially  P:  -continue LR at 125 today. Will consider stopping tomorrow. -Ensure renal perfusion. Goal MAP 65 or greater. -Avoid neprotoxic drugs as possible. -Strict I&O's -Follow up AM creatinine  Thrombocytopenia PLT 107>74>54>39. On Dilantin, keppra, acyclovir, linezolid, protonix for potential platelet offending drugs. Suspect thombocytopenia secondary to drugs and initial hepatic insult. Not on heparin.  Has not received heparin. P: -Dilantin swiched to Vimpat by neuro. protonix switched to pepcid. Continuing keppra, acylovir, linezolid at this time. Will consider stopping abx tomorrow depending on cultures -Sending peripheral smear -Monitor platelets on AM CBC  Transaminitis- Improving -Initial MELD score 24 with a 19.6% 3 month mortality rate; discriminant score 13.8, RUQ ultrasound shows mild hepatic stenosis.. AST 1035>476, ALT 309>207 P: -LFTs continue to trend down. Obtain LFTs PRN.  -Continue lactulose. No BM since admission.   DM, Hyperglycemia  -BG 72 to 262, A1C 6.8. Isolated BG of 262 at 0000 P:  -Continue SSI. Will consider increasing SSI tomorrow. Attempting to avoid hypoglycemia -Blood Glucose goal 140-180.  Fever without leukocytosis -Suspect CNS infection. TMAX 100.58. WBC 8.4 P:  -Antibiotics as discussed -Follow up BC, CSF culture, and HSV culture  Hx HTN P:  -SBP goal as discussed -Starting 10mg  amlodipine for htn  Acute Blood Loss Anemia Suspect Post seizureTPA administration, with oropharyngeal bleeding. Oropharyngeal bleeding resolved. No active signs of bleeding. HBG 13 at arrival now 8.4 P: -Transfuse PRBC if HBG less than 7 -Obtain AM CBC to trend H&H -continue to monitor flor bleeding   Best practice (right click and "Reselect all SmartList Selections" daily)  Diet:  Tube Feed  Pain/Anxiety/Delirium protocol (if indicated): Yes (RASS goal -1) VAP protocol (if indicated): Yes DVT prophylaxis:  SCD GI prophylaxis: H2B and PPI Glucose control:  SSI Yes Central venous access:  Yes, and it is still needed Arterial line:  N/A Foley:  Yes, and it is still needed Mobility:  bed rest  PT consulted: N/A Last date of multidisciplinary goals of care discussion [5/24 over phone with wife] Code Status:  full code Disposition:  ICU  Critical care time: 33 mins     Gershon Mussel., MSN, APRN, AGACNP-BC [facility]   10/19/2020 , 11:36 AM  Please see [email or web address] for pager details  If no response, please call [phone] After hours, please call Elink at [phone]

## 2020-10-19 NOTE — Progress Notes (Signed)
5/25 Spoke with [name]' spouse Ella Jubilee on the phone at [phone]. Updated her on her spouses condition.   Gershon Mussel., MSN, APRN, AGACNP-BC [facility]  10/19/2020 , 1:17 PM  Please see [email or web address] for pager details  If no response, please call [phone] After hours, please call Elink at [phone]

## 2020-10-19 NOTE — Procedures (Addendum)
Patient Name:Arthur [name]:[id number] Epilepsy Attending:Darelle Kings Annabelle Harman Referring Physician/Provider:Dr Milon Dikes Duration:10/18/2020 2122 to 5/25/20221435  Patient history:60 year old with prior stroke with residual minimal speech deficit, hypertension, alcohol abuse presenting for evaluation of sudden onset of what EMS described as left facial weakness and word finding difficulty.EEG to evaluate for seizure.  Level of alertness:comatose  AEDs during EEG study:LEV, PHT  Technical aspects: This EEG study was done with scalp electrodes positioned according to the 10-20 International system of electrode placement. Electrical activity was acquired at a sampling rate of 500Hz  and reviewed with a high frequency filter of 70Hz  and a low frequency filter of 1Hz . EEG data were recorded continuously and digitally stored.   Description: EEG showed continuous generalized3 to 6 Hz theta-delta slowingadmixed with15 to 18 Hz beta activity distributed symmetrically and diffusely. Sharp waves werealso noted in left frontotemporal region. Hyperventilation and photic stimulation were not performed.   ABNORMALITY - Sharp wave, left frontotemporal region - Continuous slow, generalizedand maximal left frontotemporal region  IMPRESSION: This studyshowed evidence of cortical dysfunction and epileptogenicity arising from left frontotemporal region due to underlying stroke. There is also moderate to severe diffuse encephalopathy, nonspecific etiology but likely related to sedation. No seizures were seen during the study.   Lorris Carducci 

## 2020-10-20 DIAGNOSIS — R569 Unspecified convulsions: Secondary | ICD-10-CM

## 2020-10-20 LAB — BASIC METABOLIC PANEL
Anion gap: 8 (ref 5–15)
BUN: 10 mg/dL (ref 6–20)
CO2: 21 mmol/L — ABNORMAL LOW (ref 22–32)
Calcium: 8 mg/dL — ABNORMAL LOW (ref 8.9–10.3)
Chloride: 113 mmol/L — ABNORMAL HIGH (ref 98–111)
Creatinine, Ser: 1.82 mg/dL — ABNORMAL HIGH (ref 0.61–1.24)
GFR, Estimated: 43 mL/min — ABNORMAL LOW (ref >60)
Glucose, Bld: 92 mg/dL (ref 70–99)
Potassium: 3.6 mmol/L (ref 3.5–5.1)
Sodium: 142 mmol/L (ref 135–145)

## 2020-10-20 LAB — HSV 1/2 PCR, CSF
HSV-1 DNA: NEGATIVE
HSV-2 DNA: NEGATIVE

## 2020-10-20 LAB — GLUCOSE, CAPILLARY
Glucose-Capillary: 80 mg/dL (ref 70–99)
Glucose-Capillary: 114 mg/dL — ABNORMAL HIGH (ref 70–99)
Glucose-Capillary: 97 mg/dL (ref 70–99)
Glucose-Capillary: 106 mg/dL — ABNORMAL HIGH (ref 70–99)
Glucose-Capillary: 191 mg/dL — ABNORMAL HIGH (ref 70–99)

## 2020-10-20 LAB — CBC
HCT: 29.4 % — ABNORMAL LOW (ref 39.0–52.0)
Hemoglobin: 9.1 g/dL — ABNORMAL LOW (ref 13.0–17.0)
MCH: 26.5 pg (ref 26.0–34.0)
MCHC: 31 g/dL (ref 30.0–36.0)
MCV: 85.7 fL (ref 80.0–100.0)
Platelets: 47 10*3/uL — ABNORMAL LOW (ref 150–400)
RBC: 3.43 MIL/uL — ABNORMAL LOW (ref 4.22–5.81)
RDW: 18 % — ABNORMAL HIGH (ref 11.5–15.5)
WBC: 7.7 10*3/uL (ref 4.0–10.5)
nRBC: 0 % (ref 0.0–0.2)

## 2020-10-20 MED ORDER — POLYETHYLENE GLYCOL 3350 17 G PO PACK: 17.0000 g | PACK | Freq: Every day | ORAL | Status: DC

## 2020-10-20 MED ORDER — DEXTROSE 5 % IV SOLN
10.0000 mg/kg | Freq: Two times a day (BID) | INTRAVENOUS | Status: DC
  Filled 2020-10-20 (×2): qty 11.4

## 2020-10-20 MED ORDER — DOCUSATE SODIUM 100 MG PO CAPS: 100.0000 mg | ORAL_CAPSULE | Freq: Two times a day (BID) | ORAL | Status: DC

## 2020-10-20 MED ORDER — FAMOTIDINE 20 MG PO TABS: 20.0000 mg | ORAL_TABLET | Freq: Every day | ORAL | Status: DC

## 2020-10-20 MED ORDER — AMLODIPINE BESYLATE 10 MG PO TABS
10.0000 mg | ORAL_TABLET | Freq: Every day | ORAL | Status: DC
  Administered 2020-10-20 – 2020-10-25 (×6): 10 mg via ORAL
  Filled 2020-10-20 (×6): qty 1

## 2020-10-20 MED ORDER — POTASSIUM CHLORIDE 20 MEQ PO PACK
40.0000 meq | PACK | Freq: Once | ORAL | Status: AC
  Administered 2020-10-20: 40 meq
  Filled 2020-10-20: qty 2

## 2020-10-20 MED ORDER — CLONIDINE HCL 0.1 MG PO TABS
0.1000 mg | ORAL_TABLET | Freq: Two times a day (BID) | ORAL | Status: DC
  Administered 2020-10-20 – 2020-10-25 (×10): 0.1 mg via ORAL
  Filled 2020-10-20 (×10): qty 1

## 2020-10-20 MED ORDER — ORAL CARE MOUTH RINSE
15.0000 mL | Freq: Two times a day (BID) | OROMUCOSAL | Status: DC
  Administered 2020-10-21 – 2020-10-24 (×7): 15 mL via OROMUCOSAL

## 2020-10-20 MED ORDER — FAMOTIDINE 20 MG PO TABS
20.0000 mg | ORAL_TABLET | Freq: Every day | ORAL | Status: DC
  Administered 2020-10-21: 20 mg via ORAL
  Filled 2020-10-20: qty 1

## 2020-10-20 MED ORDER — ENSURE ENLIVE PO LIQD
237.0000 mL | Freq: Three times a day (TID) | ORAL | Status: DC
  Administered 2020-10-20 – 2020-10-24 (×10): 237 mL via ORAL
  Filled 2020-10-20 (×3): qty 237

## 2020-10-20 NOTE — TOC Initial Note (Addendum)
Transition of Care [facility]) - Initial/Assessment Note    Patient Details  Name: Arthur [name] MRN: [id number] Date of Birth: Jun 18, 1960  [facility] of [facility]) CM/SW Contact:    Lockie Pares, RN Phone Number: 10/20/2020, 10:53 AM  Clinical Narrative:                 60 year old admitted with code stroke- previous CVA, gave Tpa yet had to stop due to bleeding Lives with wife,  Transferred to stepdown from ICU. ETOH questionable withdrawal seizures.. Extubated today.  CM will follow for needs   Expected Discharge Plan: Home w Home Health Services Barriers to Discharge: Continued Medical Work up   Patient Goals and CMS Choice        Expected Discharge Plan and Services Expected Discharge Plan: Home w Home Health Services       Living arrangements for the past 2 months: Single Family Home                                      Prior Living Arrangements/Services Living arrangements for the past 2 months: Single Family Home Lives with:: Spouse Patient language and need for interpreter reviewed:: Yes        Need for Family Participation in Patient Care: Yes (Comment) Care giver support system in place?: Yes (comment)   Criminal Activity/Legal Involvement Pertinent to Current Situation/Hospitalization: No - Comment as needed  Activities of Daily Living      Permission Sought/Granted                  Emotional Assessment       Orientation: : Fluctuating Orientation (Suspected and/or reported Sundowners) (AMS) Alcohol / Substance Use: Not Applicable Psych Involvement: No (comment)  Admission diagnosis:  Transaminitis [R74.01] Acute encephalopathy [G93.40] Toxic metabolic encephalopathy [G92.8] Endotracheally intubated [Z97.8] Patient Active Problem List   Diagnosis Date Noted  . Pressure injury of skin 10/19/2020  . Protein-calorie malnutrition, severe 10/18/2020  . Endotracheally intubated   . Acute encephalopathy 10/16/2020  . Toxic metabolic  encephalopathy 10/16/2020  . Transaminitis   . Essential hypertension 10/08/2019  . Cerebrovascular accident (CVA) (HCC) 10/02/2019   PCP:  Chevis Pretty, MD Pharmacy:   [facility] [address] [place] [address] Phone: [phone] Fax: [phone]     Social Determinants of Health (SDOH) Interventions    Readmission Risk Interventions No flowsheet data found.

## 2020-10-20 NOTE — Evaluation (Addendum)
Clinical/Bedside Swallow Evaluation Patient Details  Name: Arthur [name] MRN: [id number] Date of Birth: 12/13/1960  Today's Date: 10/20/2020 Time: SLP Start Time (ACUTE ONLY): 1340 SLP Stop Time (ACUTE ONLY): 1404 SLP Time Calculation (min) (ACUTE ONLY): 24 min  Past Medical History:  Past Medical History:  Diagnosis Date  . ETOH abuse   . Hypertension    Past Surgical History:  Past Surgical History:  Procedure Laterality Date  . HEMORRHOID SURGERY     HPI:  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  He was able to follow some commands and moved all extremities on arrival.  Code stroke was initated.  Initial CTH neg.   tPA was started then patient started to have tremulous movements and blood coming from mouth (noted to have cheek laceration) concerning for seizure activity, and given ETOH hx, tPA was therefore stopped.  Suspected to have possible ETOH withdrawal seizures.  Repeat CT head negative on 5/22.  Workk up for CNS infection without positive fidnign thus far. Pt intubated 5/22 to 5/26.   Assessment / Plan / Recommendation Clinical Impression  Pt passed 3 oz water swallow with SLP and demonstrated no further dysphagia with solids or liquids. Pt may resume a regular diet and thin liquids. Will sign off for swallowing. See next note regarding aphasia. SLP Visit Diagnosis: Dysphagia, oropharyngeal phase (R13.12)    Aspiration Risk  Mild aspiration risk    Diet Recommendation Regular;Thin liquid   Liquid Administration via: Cup;Straw    Other  Recommendations     Follow up Recommendations None      Frequency and Duration            Prognosis        Swallow Study   General HPI: [name] is a 60 year old male with prior hx of HTN, HLD, CVA (09/2019  Left MCA- out of window for any intervention w/ residual speech issues), and ETOH abuse who presented as code stroke by EMS with garbled speech and questionable left facial droop.  He was able to follow some commands and moved all extremities on arrival.  Code stroke was initated.  Initial CTH neg.   tPA was started then patient started to have tremulous movements and blood coming from mouth (noted to have cheek laceration) concerning for seizure activity, and given ETOH hx, tPA was therefore stopped.  Suspected to have possible ETOH withdrawal seizures.  Repeat CT head negative on 5/22.  Workk up for CNS infection without positive fidnign thus far. Pt intubated 5/22 to 5/26. Type of Study: Bedside Swallow Evaluation Previous Swallow Assessment: none Diet Prior to this Study: NPO Temperature Spikes Noted: No Respiratory Status: Room air History of Recent Intubation: Yes Length of Intubations (days): 5 days Date extubated: 10/20/20 Behavior/Cognition: Alert;Cooperative;Pleasant mood Oral Cavity Assessment: Within Functional Limits Oral Care Completed by SLP: No Oral Cavity - Dentition: Adequate natural dentition Vision: Functional for self-feeding Self-Feeding Abilities: Able to feed self Patient Positioning: Upright in bed Baseline Vocal Quality: Normal Volitional Cough: Strong    Oral/Motor/Sensory Function Overall Oral Motor/Sensory Function: Within functional limits   Ice Chips     Thin Liquid Thin Liquid: Within functional limits Presentation: Straw;Cup;Self Fed    Nectar Thick Nectar Thick Liquid: Not  tested   Honey Thick Honey Thick Liquid: Not tested   Puree Puree: Within functional limits   Solid     Solid: Within functional limits     Harlon Ditty, MA CCC-SLP  Acute Rehabilitation Services Pager [phone] Office [phone]  Claudine Mouton 10/20/2020,2:15 PM

## 2020-10-20 NOTE — Progress Notes (Signed)
NAME:  Arthur [name], MRN:  [id number], DOB:  10-27-1960, LOS: 4 ADMISSION DATE:  10/16/2020, CONSULTATION DATE:  10/16/2020 REFERRING MD:  Dr. 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  He was able to follow some commands and moved all extremities on arrival.  Code stroke was initated.  Initial CTH neg.   tPA was started then patient started to have tremulous movements and blood coming from mouth (noted to have cheek laceration) concerning for seizure activity, and given ETOH hx, tPA was therefore stopped.  Suspected to have possible ETOH withdrawal seizures.  He only received 5.1 mg of tPA at 1523 on 5/22. Repeat CT head negative on 5/22.    PCCM consulted for admission. He remains in the 17M ICU in critical condition.  Pertinent  Medical History  HTN, HLD, CVA (residual speech issues)  Significant Hospital Events: Including procedures, antibiotic start and stop dates in addition to other pertinent events   . 5/22 admitted with acute encephalopathy, initially thought to be stroke, CTH neg, tPA bolus 5mg  given, then stopped after developing seizure activity.  Intubated for airway protection/ declining mental status.  Febrile with AKI and Transaminitis. Vanc & ceftriaxone 5/22-5/23  BC> 11-16-1968 ABD> Hepatic steatosis, no renal mass or hydro. . 5/23 EEG> subclinical seizures seen MRI> abnormal diffusion signal involving left hippocampus, seizure effect vs clinical infarct. ECHO> LVEF 55 to 60% Mild LVH, grade 1 diastolic dysfunction. RVSF 5/23 LP culture>> CSF HSV>> , Linezolid & Ceftriaxone > . 5/24 Acyclovir started, Hepatitis panel negative . 5/23 CSF HSV1/2 negative   Interim History / Subjective:  Some blood from OG tube  reported o/n precedex 1.2  I/O +7.7L   Objective   Blood pressure (!) 161/77, pulse 74, temperature (!) 96.4 F (35.8 C), temperature source Axillary, resp. rate 18, height 5\' 1"  (1.549 m), weight 57.6 kg, SpO2 100 %.    Vent Mode: PRVC FiO2 (%):  [30 %-40 %] 40 % Set Rate:  [18 bmp] 18 bmp Vt Set:  [420 mL] 420 mL PEEP:  [5 cmH20] 5 cmH20 Pressure Support:  [12 cmH20] 12 cmH20 Plateau Pressure:  [15 cmH20-17 cmH20] 15 cmH20   Intake/Output Summary (Last 24 hours) at 10/20/2020 0719 Last data filed at 10/20/2020 0600 Gross per 24 hour  Intake 4798.5 ml  Output 2100 ml  Net 2698.5 ml   Filed Weights   10/16/20 1500 10/17/20 0500 10/20/20 0500  Weight: 57.2 kg 57 kg 57.6 kg    Examination: General:  In bed, thin chronically ill-appearing man, no distress HEENT: ET tube in good position, OG tube in position Neuro: Nods to questions, follows commands, moves all extremities CV: Regular, distant, no murmur PULM: Clear bilaterally, no wheeze or crackles GI: Nondistended, positive bowel sounds Extremities: No edema Skin: No rash  Labs/imaging that I havepersonally reviewed  (right click and "Reselect all SmartList Selections" daily)   All studies reviewed 5/26 CSF HSV negative  Resolved Hospital Problem list   Oropharyngeal Bleeding Hyponatremia Assessment & Plan:   Acute encephalopathy- Seizures vs infection R/o CNS process/ infection w/ fever Hx of prior L MCA CVA 09/2019 with residual speech issues (no intervention given he presented out of window) ETOH  abuse- last drink, 1 beer 5/21 per wife ? Seizure vs CNS infection.  Cultures have been negative, HSV PCR is now negative P: -Continuous EEG discontinued on 5/25 -Continue Keppra, Vimpat -At this point would feel comfortable stopping acyclovir, linezolid, ceftriaxone. -Seizure precautions -Work to wean Precedex as able.  He is at significant risk for alcohol withdrawal -Thiamine, folate -Will need alcohol cessation  counseling going forward   Acute Respiratory Failure with Hypercarbia requiring Mechanical Ventilation Secondary to Acute Encephalopathy. P:  -Tolerating PSV this morning.  Following commands, we will work towards extubation -Pulmonary hygiene  AKI-Improving Creat 3.5 peak >3.43> 3.33>2.48. Abd US shows no renal mass or hydro. 1.9L uop past 24.  Suspect due to hypovolemia initially  P:  -Continue to improve.  Continue supportive care. -KVO LR infusion today 5/26 -Follow BMP, urine output  Thrombocytopenia PLT 107>74>54>39. On Dilantin, keppra, acyclovir, linezolid, protonix for potential platelet offending drugs. Suspect thombocytopenia secondary to drugs and initial hepatic insult. Not on heparin. Has not received heparin.  Improved 5/26 P: -Continue to follow on current medical regimen -No clear indication for DIC on his peripheral smear -Follow CBC  Transaminitis- Improving -Initial MELD score 24 with a 19.6% 3 month mortality rate; discriminant score 13.8, RUQ ultrasound shows mild hepatic stenosis.. AST 1035>476, ALT 309>207 P: -Follow intermittently -We will stop his lactulose 5/26 and follow neurological status  DM, Hyperglycemia  -BG 72 to 262, A1C 6.8. Isolated BG of 262 at 0000 P:  -Sliding scale insulin coverage  Fever without leukocytosis P:  -Plan to stop antibiotics 5/26 and follow clinically -Follow cultures to completion  Hx HTN P:  -amlodipine 10 mg daily  Acute Blood Loss Anemia Suspect Post seizureTPA administration, with oropharyngeal bleeding. Oropharyngeal bleeding resolved. No active signs of bleeding. HBG 13 at arrival now 8.4 P: -Follow CBC -Transfusion goal hemoglobin 7.0   Best practice (right click and "Reselect all SmartList Selections" daily)  Diet:  NPO Pain/Anxiety/Delirium protocol (if indicated): No VAP protocol (if indicated): Not indicated DVT prophylaxis: SCD GI prophylaxis: H2B and PPI Glucose control:  SSI Yes Central  venous access:  Yes, and it is still needed Arterial line:  N/A Foley:  Yes, and it is still needed Mobility:  bed rest  PT consulted: N/A Last date of multidisciplinary goals of care discussion [5/24 over phone with wife] Code Status:  full code Disposition:  ICU  Critical care time: 34 mins     Levy Pupa, MD, PhD 10/20/2020, 8:24 AM [facility] [phone] or if no answer before 7:00PM call [phone] For any issues after 7:00PM please call eLink [phone]

## 2020-10-20 NOTE — Progress Notes (Signed)
Nutrition Follow-up  DOCUMENTATION CODES:   Severe malnutrition in context of social or environmental circumstances  INTERVENTION:   - Ensure Enlive po TID, each supplement provides 350 kcal and 20 grams of protein  - Continue MVI with minerals daily  - Encourage adequate PO intake  NUTRITION DIAGNOSIS:   Severe Malnutrition related to social / environmental circumstances (EtOH abuse) as evidenced by severe muscle depletion,severe fat depletion.  Ongoing, being addressed via diet advancement and supplements  GOAL:   Patient will meet greater than or equal to 90% of their needs  Progressing  MONITOR:   PO intake,Supplement acceptance,Labs,Weight trends,Skin  REASON FOR ASSESSMENT:   Ventilator,Consult Enteral/tube feeding initiation and management  ASSESSMENT:   60 year old male who presented to the ED on 5/22 with aphasia, left facial droop. PMH of HTN, EtOH abuse, L MCA CVA. Pt was receiving tPA when he developed seizures. Pt required intubation for airway protection.  5/23 - lumbar puncture 5/26 - extubated  Discussed pt with RN and during ICU rounds. Pt extubated today. Per SLP note, pt cleared for regular diet with thin liquids and diet was advanced.  Spoke with pt at bedside. Pt slightly confused and doesn't remember how swallow evaluation with SLP went that was completed just a few minutes prior. Pt states that he is hungry and feels ready to eat. Pt reports that he eats well at home.  RD to order oral nutrition supplements to aid pt in meeting kcal and protein needs.  Admit weight: 57.2 kg Current weight: 57.6 kg  Medications reviewed and include: colace, pepcid, folic acid, SSI q 4 hours, miralax, thiamine IVF: LR @ 10 ml/hr  Labs reviewed: creatinine 1.82, hemoglobin 9.1, elevated LFTs on 5/25 CBG's: 80-144 x 24 hours  UOP: 1550 ml x 24 hours I/O's: +8.3 L since admit  Diet Order:   Diet Order            Diet regular Room service appropriate?  Yes; Fluid consistency: Thin  Diet effective now                 EDUCATION NEEDS:   Not appropriate for education at this time  Skin:  Skin Assessment: Skin Integrity Issues: DTI: right face Stage II: left face  Last BM:  10/20/20 type 7 (per RN)  Height:   Ht Readings from Last 1 Encounters:  10/16/20 5\' 1"  (1.549 m)    Weight:   Wt Readings from Last 1 Encounters:  10/20/20 57.6 kg    BMI:  Body mass index is 23.99 kg/m.  Estimated Nutritional Needs:   Kcal:  1800-2000  Protein:  80-100 grams  Fluid:  1.8-2.0 L    10/22/20, MS, RD, LDN Inpatient Clinical Dietitian Please see AMiON for contact information.

## 2020-10-20 NOTE — Consult Note (Signed)
WOC Nurse Consult Note: Patient receiving care in [facility] 3M13. Reason for Consult: elbow wounds Wound type: friction abrasions from repeated movement against the mattress and linens Pressure Injury POA: Yes/No/NA Measurement: scattered along both elbows Wound bed: pink Drainage (amount, consistency, odor) none Periwound: intact Dressing procedure/placement/frequency: The patient had foam dressings over the small, superficial wounds on both elbows. Continue this approach. Dressings can remain in place up to 3 days.  Monitor the wound area(s) for worsening of condition such as: Signs/symptoms of infection,  Increase in size,  Development of or worsening of odor, Development of pain, or increased pain at the affected locations.  Notify the medical team if any of these develop.  Thank you for the consult.  Discussed plan of care with the bedside nurse.  WOC nurse will not follow at this time.  Please re-consult the WOC team if needed.  Helmut Muster, RN, MSN, CWOCN, CNS-BC, pager [phone]

## 2020-10-20 NOTE — Evaluation (Signed)
Speech Language Pathology Evaluation Patient Details Name: Arthur [name] MRN: [id number] DOB: May 08, 1961 Today's Date: 10/20/2020 Time: 1324-4010 SLP Time Calculation (min) (ACUTE ONLY): 24 min  Problem List:  Patient Active Problem List   Diagnosis Date Noted  . Pressure injury of skin 10/19/2020  . Protein-calorie malnutrition, severe 10/18/2020  . Endotracheally intubated   . Acute encephalopathy 10/16/2020  . Toxic metabolic encephalopathy 10/16/2020  . Transaminitis   . Essential hypertension 10/08/2019  . Cerebrovascular accident (CVA) (HCC) 10/02/2019   Past Medical History:  Past Medical History:  Diagnosis Date  . ETOH abuse   . Hypertension    Past Surgical History:  Past Surgical History:  Procedure Laterality Date  . HEMORRHOID SURGERY     HPI:  Mr. SKYLUR FUSTON is a 60 year old male with prior hx of HTN, HLD, CVA (09/2019 Left MCA- out of window for any intervention w/ residual speech issues), and ETOH abuse who presented as code stroke by EMS with garbled speech and questionable left facial droop.  He was able to follow some commands and moved all extremities on arrival.  Code stroke was initated.  Initial CTH neg.   tPA was started then patient started to have tremulous movements and blood coming from mouth (noted to have cheek laceration) concerning for seizure activity, and given ETOH hx, tPA was therefore stopped.  Suspected to have possible ETOH withdrawal seizures.  Repeat CT head negative on 5/22.  Workk up for CNS infection without positive fidnign thus far. Pt intubated 5/22 to 5/26.   Assessment / Plan / Recommendation Clinical Impression  Pt demonstrates aphasia worsened from baseline given report of 2021 function available in chart. Pt with dysnomia with less than 50% accuracy with perseveration, paraphasias frequent. Pt needs multiple trials to correctly repeat a single word. He is making minimal effort to communicate wants and needs. Awareness of errors is  poor, cues needed to elicit self correction. Pt much more communicative and functional after CVA in 2021. Hopeful that pt will improve rapidly, will follow to facilitate recovery.    SLP Assessment  SLP Recommendation/Assessment: Patient needs continued Speech Lanaguage Pathology Services SLP Visit Diagnosis: Dysphagia, oropharyngeal phase (R13.12)    Follow Up Recommendations  Inpatient Rehab    Frequency and Duration min 2x/week  2 weeks      SLP Evaluation Cognition  Overall Cognitive Status: Impaired/Different from baseline Arousal/Alertness: Awake/alert Orientation Level: Oriented to person (incorrect orientation, but secondry to aphasia) Attention: Focused;Sustained Focused Attention: Appears intact Sustained Attention: Appears intact Memory: Impaired Memory Impairment: Decreased short term memory Awareness: Impaired Awareness Impairment: Emergent impairment;Intellectual impairment Safety/Judgment: Appears intact       Comprehension  Auditory Comprehension Overall Auditory Comprehension: Impaired Yes/No Questions: Impaired Complex Questions: 50-74% accurate Commands: Impaired One Step Basic Commands: 50-74% accurate    Expression Verbal Expression Overall Verbal Expression: Impaired Initiation: No impairment Automatic Speech: Name;Social Response;Counting Level of Generative/Spontaneous Verbalization: Phrase Repetition: Impaired Level of Impairment: Word level Naming: Impairment Responsive: Not tested Confrontation: Impaired Convergent: 25-49% accurate Divergent: Not tested Verbal Errors: Semantic paraphasias;Phonemic paraphasias;Neologisms Pragmatics: No impairment Interfering Components: Premorbid deficit Written Expression Dominant Hand: Right   Oral / Motor  Oral Motor/Sensory Function Overall Oral Motor/Sensory Function: Within functional limits Motor Speech Overall Motor Speech: Appears within functional limits for tasks assessed   GO                    Harlon Ditty, MA CCC-SLP  Acute Rehabilitation Services Pager [phone]  Office [phone]  Claudine Mouton 10/20/2020, 2:24 PM

## 2020-10-20 NOTE — Progress Notes (Signed)
Subjective: No acute events overnight.  HSV PCR came back negative.  ROS: negative except above  Examination  Vital signs in last 24 hours: Temp:  [96.4 F (35.8 C)-98.3 F (36.8 C)] 97.8 F (36.6 C) (05/26 1100) Pulse Rate:  [64-132] 128 (05/26 1500) Resp:  [13-29] 28 (05/26 1500) BP: (129-191)/(70-110) 150/96 (05/26 1500) SpO2:  [93 %-100 %] 100 % (05/26 1500) FiO2 (%):  [30 %-40 %] 30 % (05/26 0900) Weight:  [57.6 kg] 57.6 kg (05/26 0500)  General: lying in bed, not in apparent distress CVS: pulse-normal rate and rhythm RS: breathing comfortably, coarse breath sounds bilaterally Extremities: normal, warm Neuro: Awake, alert, able to tell me his name and that he is in the hospital, not oriented to time, follows simple commands, able to name objects 3/4 but does perseverate at times, cranial nerves 2- 12 appear grossly intact, antigravity strength in all 4 extremities  Basic Metabolic Panel: Recent Labs  Lab 10/16/20 1505 10/16/20 1505 10/16/20 1508 10/16/20 1729 10/16/20 2007 10/16/20 2232 10/17/20 0151 10/17/20 0746 10/17/20 1654 10/18/20 0735 10/18/20 2049 10/19/20 0522 10/20/20 0454  NA 129*  --  132*   < > 136  --  134*  --   --  138  --  142 142  K 4.6  --  4.7   < > 4.6  --  4.2  --   --  4.1  --  3.4* 3.6  CL 98  --  104  --   --   --  107  --   --  108  --  114* 113*  CO2 16*  --   --   --   --   --  19*  --   --  19*  --  23 21*  GLUCOSE 188*  --  181*  --   --   --  92  --   --  130*  --  120* 92  BUN 33*  --  37*  --   --   --  29*  --   --  22*  --  14 10  CREATININE 3.31*  --  3.50*  --   --   --  3.43*  --   --  3.33*  --  2.48* 1.82*  CALCIUM 7.7*  --   --   --   --   --  7.2*  --   --  7.8*  --  7.7* 8.0*  MG  --    < >  --   --   --  1.6*  --  2.7* 2.4 2.1 1.7 1.7  --   PHOS  --   --   --   --   --  4.5  --   --  4.9* 4.6 3.9 3.2  --    < > = values in this interval not displayed.    CBC: Recent Labs  Lab 10/16/20 1505 10/16/20 1508  10/16/20 2007 10/17/20 0151 10/18/20 0735 10/19/20 0522 10/20/20 0454  WBC 8.1  --   --  6.8 8.4 7.2 7.7  NEUTROABS 6.8  --   --   --   --   --   --   HGB 12.5*   < > 12.9* 10.6* 10.2* 8.4* 9.1*  HCT 37.1*   < > 38.0* 30.7* 31.0* 26.1* 29.4*  MCV 78.9*  --   --  77.3* 81.6 82.6 85.7  PLT 107*  --   --  74* 54*  39* 47*   < > = values in this interval not displayed.     Coagulation Studies: No results for input(s): LABPROT, INR in the last 72 hours.  Imaging No new imaging overnight  ASSESSMENT AND PLAN: 60 year old male with history of stroke, alcohol use who presented with sudden onset speech disturbance, left facial droop status post tPA. Also noted to be febrile and had seizures.  New onset epilepsy Acute encephalopathy, improving Chronic stroke Alcohol use disorder Thrombocytopenia -Seizures likely secondary to underlying stroke, alcohol use. CSF pleocytosiscould be secondary to seizure   Recommendations -Continue Keppra 500 mg twice daily (renally adjusted) and Vimpat 50 mg twice daily. -Continue seizure precautions including do not drive for 6 months -Recommend follow-up with neurology in 3 months -Management of rest of comorbidities per primary team  Seizure precautions: Per [facility] statutes, patients with seizures are not allowed to drive until they have been seizure-free for six months and cleared by a physician    Use caution when using heavy equipment or power tools. Avoid working on ladders or at heights. Take showers instead of baths. Ensure the water temperature is not too high on the home water heater. Do not go swimming alone. Do not lock yourself in a room alone (i.e. bathroom). When caring for infants or small children, sit down when holding, feeding, or changing them to minimize risk of injury to the child in the event you have a seizure. Maintain good sleep hygiene. Avoid alcohol.    If patient has another seizure, call 911 and bring them  back to the ED if: A.  The seizure lasts longer than 5 minutes.      B.  The patient doesn't wake shortly after the seizure or has new problems such as difficulty seeing, speaking or moving following the seizure C.  The patient was injured during the seizure D.  The patient has a temperature over 102 F (39C) E.  The patient vomited during the seizure and now is having trouble breathing    During the Seizure   - First, ensure adequate ventilation and place patients on the floor on their left side  Loosen clothing around the neck and ensure the airway is patent. If the patient is clenching the teeth, do not force the mouth open with any object as this can cause severe damage - Remove all items from the surrounding that can be hazardous. The patient may be oblivious to what's happening and may not even know what he or she is doing. If the patient is confused and wandering, either gently guide him/her away and block access to outside areas - Reassure the individual and be comforting - Call 911. In most cases, the seizure ends before EMS arrives. However, there are cases when seizures may last over 3 to 5 minutes. Or the individual may have developed breathing difficulties or severe injuries. If a pregnant patient or a person with diabetes develops a seizure, it is prudent to call an ambulance. - Finally, if the patient does not regain full consciousness, then call EMS. Most patients will remain confused for about 45 to 90 minutes after a seizure, so you must use judgment in calling for help. - Avoid restraints but make sure the patient is in a bed with padded side rails - Place the individual in a lateral position with the neck slightly flexed; this will help the saliva drain from the mouth and prevent the tongue from falling backward - Remove all nearby furniture and  other hazards from the area - Provide verbal assurance as the individual is regaining consciousness - Provide the patient with privacy  if possible - Call for help and start treatment as ordered by the caregiver    After the Seizure (Postictal Stage)   After a seizure, most patients experience confusion, fatigue, muscle pain and/or a headache. Thus, one should permit the individual to sleep. For the next few days, reassurance is essential. Being calm and helping reorient the person is also of importance.   Most seizures are painless and end spontaneously. Seizures are not harmful to others but can lead to complications such as stress on the lungs, brain and the heart. Individuals with prior lung problems may develop labored breathing and respiratory distress.   I have spent a total of 25  minuteswith the patient reviewing hospitalnotes,  test results, labs and examining the patient as well as establishing an assessment and plan.>50% of time was spent in direct patient care.  Lindie Spruce Epilepsy [facility] For questions after 5pm please refer to AMION to reach the Neurologist on call

## 2020-10-20 NOTE — Procedures (Signed)
Extubation Procedure Note  Patient Details:   Name: Arthur [name] DOB: 30-Sep-1960 MRN: [id number]   Airway Documentation:    Vent end date: 10/20/20 Vent end time: 0915   Evaluation  O2 sats: stable throughout Complications: No apparent complications Patient did tolerate procedure well. Bilateral Breath Sounds: Diminished,Clear   Yes  Placed on 4L/min [place] Incentive spirometer in room unable to perform at this time.  Newt Lukes 10/20/2020, 9:16 AM

## 2020-10-21 ENCOUNTER — Other Ambulatory Visit: Payer: Self-pay

## 2020-10-21 ENCOUNTER — Encounter (HOSPITAL_COMMUNITY): Payer: Self-pay | Admitting: Emergency Medicine

## 2020-10-21 LAB — CSF CULTURE W GRAM STAIN: Culture: NO GROWTH

## 2020-10-21 LAB — CBC
HCT: 28.6 % — ABNORMAL LOW (ref 39.0–52.0)
Hemoglobin: 9.4 g/dL — ABNORMAL LOW (ref 13.0–17.0)
MCH: 26.6 pg (ref 26.0–34.0)
MCHC: 32.9 g/dL (ref 30.0–36.0)
MCV: 80.8 fL (ref 80.0–100.0)
Platelets: 70 10*3/uL — ABNORMAL LOW (ref 150–400)
RBC: 3.54 MIL/uL — ABNORMAL LOW (ref 4.22–5.81)
RDW: 17.3 % — ABNORMAL HIGH (ref 11.5–15.5)
WBC: 10.9 10*3/uL — ABNORMAL HIGH (ref 4.0–10.5)
nRBC: 0.5 % — ABNORMAL HIGH (ref 0.0–0.2)

## 2020-10-21 LAB — GLUCOSE, CAPILLARY
Glucose-Capillary: 108 mg/dL — ABNORMAL HIGH (ref 70–99)
Glucose-Capillary: 128 mg/dL — ABNORMAL HIGH (ref 70–99)
Glucose-Capillary: 150 mg/dL — ABNORMAL HIGH (ref 70–99)
Glucose-Capillary: 81 mg/dL (ref 70–99)
Glucose-Capillary: 83 mg/dL (ref 70–99)
Glucose-Capillary: 90 mg/dL (ref 70–99)
Glucose-Capillary: 95 mg/dL (ref 70–99)

## 2020-10-21 LAB — BASIC METABOLIC PANEL
Anion gap: 8 (ref 5–15)
BUN: 9 mg/dL (ref 6–20)
CO2: 23 mmol/L (ref 22–32)
Calcium: 8.2 mg/dL — ABNORMAL LOW (ref 8.9–10.3)
Chloride: 111 mmol/L (ref 98–111)
Creatinine, Ser: 1.75 mg/dL — ABNORMAL HIGH (ref 0.61–1.24)
GFR, Estimated: 45 mL/min — ABNORMAL LOW (ref >60)
Glucose, Bld: 83 mg/dL (ref 70–99)
Potassium: 3.3 mmol/L — ABNORMAL LOW (ref 3.5–5.1)
Sodium: 142 mmol/L (ref 135–145)

## 2020-10-21 LAB — PHOSPHORUS: Phosphorus: 2.3 mg/dL — ABNORMAL LOW (ref 2.5–4.6)

## 2020-10-21 LAB — MAGNESIUM: Magnesium: 1.2 mg/dL — ABNORMAL LOW (ref 1.7–2.4)

## 2020-10-21 MED ORDER — METOPROLOL TARTRATE 5 MG/5ML IV SOLN
2.5000 mg | INTRAVENOUS | Status: DC | PRN
  Administered 2020-10-22 (×2): 2.5 mg via INTRAVENOUS
  Filled 2020-10-21 (×2): qty 5

## 2020-10-21 MED ORDER — POLYETHYLENE GLYCOL 3350 17 G PO PACK: 17.0000 g | PACK | Freq: Every day | ORAL | Status: DC | PRN

## 2020-10-21 MED ORDER — MAGNESIUM SULFATE 4 GM/100ML IV SOLN
4.0000 g | Freq: Once | INTRAVENOUS | Status: AC
  Administered 2020-10-21: 4 g via INTRAVENOUS
  Filled 2020-10-21: qty 100

## 2020-10-21 MED ORDER — POTASSIUM PHOSPHATES 15 MMOLE/5ML IV SOLN
15.0000 mmol | Freq: Once | INTRAVENOUS | Status: AC
  Administered 2020-10-21: 15 mmol via INTRAVENOUS
  Filled 2020-10-21: qty 5

## 2020-10-21 MED ORDER — THIAMINE HCL 100 MG PO TABS: 100.0000 mg | ORAL_TABLET | Freq: Every day | ORAL | Status: DC

## 2020-10-21 MED ORDER — FOLIC ACID 1 MG PO TABS
1.0000 mg | ORAL_TABLET | Freq: Every day | ORAL | Status: DC
  Administered 2020-10-21: 1 mg via ORAL
  Filled 2020-10-21: qty 1

## 2020-10-21 MED ORDER — POTASSIUM CHLORIDE CRYS ER 20 MEQ PO TBCR
40.0000 meq | EXTENDED_RELEASE_TABLET | Freq: Once | ORAL | Status: AC
  Administered 2020-10-21: 40 meq via ORAL
  Filled 2020-10-21: qty 2

## 2020-10-21 NOTE — Progress Notes (Signed)
NAME:  Arthur [name], MRN:  [id number], DOB:  [birth year], LOS: 5 ADMISSION DATE:  10/16/2020, CONSULTATION DATE:  10/16/2020 REFERRING MD:  Dr. Gwenlyn Fudge, CHIEF COMPLAINT:  AMS  History of Present Illness:  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  He was able to follow some commands and moved all extremities on arrival.  Code stroke was initated.  Initial CTH neg.   tPA was started then patient started to have tremulous movements and blood coming from mouth (noted to have cheek laceration) concerning for seizure activity, and given ETOH hx, tPA was therefore stopped.  Suspected to have possible ETOH withdrawal seizures.  He only received 5.1 mg of tPA at 1523 on 5/22. Repeat CT head negative on 5/22.    PCCM consulted for admission. He remains in the 16M ICU in critical condition.  Pertinent  Medical History  HTN, HLD, CVA (residual speech issues)  Significant Hospital Events: Including procedures, antibiotic start and stop dates in addition to other pertinent events   . 5/22 admitted with acute encephalopathy, initially thought to be stroke, CTH neg, tPA bolus 5mg  given, then stopped after developing seizure activity.  Intubated for airway protection/ declining mental status.  Febrile with AKI and Transaminitis. Vanc & ceftriaxone 5/22-5/23  BC> 11-16-1968 ABD> Hepatic steatosis, no renal mass or hydro. . 5/23 EEG> subclinical seizures seen MRI> abnormal diffusion signal involving left hippocampus, seizure effect vs clinical infarct. ECHO> LVEF 55 to 60% Mild LVH, grade 1 diastolic dysfunction. RVSF 5/23 LP culture>> CSF HSV>> , Linezolid & Ceftriaxone > . 5/24 Acyclovir started, Hepatitis panel negative . 5/23 CSF HSV1/2 negative  . Extubated successfully 5/26  Interim History / Subjective:    Extubated, tolerating Working with PT Passed a swallowing evaluation and taking p.o.  Objective   Blood pressure (!) 134/94, pulse (!) 138, temperature 99 F (37.2 C), temperature source Oral, resp. rate (!) 24, height 5\' 1"  (1.549 m), weight 57.4 kg, SpO2 100 %.        Intake/Output Summary (Last 24 hours) at 10/21/2020 1356 Last data filed at 10/21/2020 10/23/2020 Gross per 24 hour  Intake 1015.13 ml  Output 1985 ml  Net -969.87 ml   Filed Weights   10/17/20 0500 10/20/20 0500 10/21/20 0500  Weight: 57 kg 57.6 kg 57.4 kg    Examination: General: Weak, up to chair, HEENT: Oropharynx clear, no stridor, pupils equal Neuro: Globally weak but awake, appropriate.  Some transient confusion but redirectable CV: Regular, tachycardic, no murmur PULM: Clear bilaterally, no wheezing GI: Nondistended, positive bowel sounds Extremities: No edema Skin: No rash  Labs/imaging that I havepersonally reviewed  (right click and "Reselect all SmartList Selections" daily)   All studies reviewed 5/27  Hypokalemia and hypomagnesemia Renal function continues to improve, serum creatinine 1.75 Platelets improving, 70  Resolved Hospital Problem list   Oropharyngeal Bleeding Hyponatremia Assessment & Plan:   Acute encephalopathy- Seizures vs infection R/o CNS process/ infection w/ fever Hx of prior L MCA CVA 09/2019 with residual speech issues (no intervention given he presented out of window) ETOH abuse- last drink, 1 beer 5/21 per wife ? Seizure vs CNS infection.  Cultures have been negative, HSV PCR is now negative P: -Appreciate neurology assistance.  Plan to continue Keppra, Vimpat.  Will need to transition to oral regimen per their recommendations -Seizure precautions -No evidence of withdrawal, Precedex off -Thiamine and folate completed -Needs alcohol cessation counseling   Acute Respiratory Failure with Hypercarbia requiring Mechanical Ventilation Secondary to Acute  Encephalopathy. P:  -Continue to push pulmonary hygiene  AKI-Improving Suspect due to hypovolemia initially  P:  -Continues to improve -Following BMP, urine output with only enteral intake, IV fluids held  Thrombocytopenia Suspect due to history of alcohol and also possibly drug side effects-was on Dilantin, keppra, acyclovir, linezolid, protonix for potential platelet offending drugs. Suspect thombocytopenia secondary to drugs and initial hepatic insult. Not on heparin. Has not received heparin.  Improving P: -Continue current regimen -Follow CBC -No clear indication for DIC or effects of heparin.  Transaminitis- Improving -Initial MELD score 24 with a 19.6% 3 month mortality rate; discriminant score 13.8, RUQ ultrasound shows mild hepatic stenosis. P: -Lactulose held 5/26, tolerating  DM, Hyperglycemia  -BG 72 to 262, A1C 6.8. Isolated BG of 262 at 0000 P:  -Sliding-scale insulin coverage  Fever without leukocytosis P:  -Stopped antibiotics 5/26, following clinically -Follow cultures to completion  Hx HTN P:  -Amlodipine 10 mg daily  Acute Blood Loss Anemia Suspect Post seizureTPA administration, with oropharyngeal bleeding. Oropharyngeal bleeding resolved. No active signs of bleeding.  P: -Following CBC -Transfusion goal hemoglobin 7.0   Best practice (right click and "Reselect all SmartList Selections" daily)  Diet:  Oral Pain/Anxiety/Delirium protocol (if indicated): No VAP protocol (if indicated): Not indicated DVT prophylaxis: SCD GI prophylaxis: H2B Glucose control:  SSI Yes Central venous access:  N/A Arterial line:  N/A Foley:  N/A Mobility:  OOB  PT consulted: Yes Last date of multidisciplinary goals of care discussion [5/24 over phone with wife] Code Status:  full code Disposition:  ICU  Critical care time: NA     Levy Pupa, MD, PhD 10/21/2020, 1:56 PM [facility] [phone] or if no answer before 7:00PM call  [phone] For any issues after 7:00PM please call eLink [phone]

## 2020-10-21 NOTE — Plan of Care (Addendum)
Discussed with patient plan of care for the evening, pain management, blood pressure and heart rate with some teach back displayed.  Patient took blood pressure medication early to see if it would bring his blood pressure and heart rate down.  Patient and family aware if the bed is needed he has orders to transfer to the floor.  Problem: Education: Goal: Knowledge of General Education information will improve Description: Including pain rating scale, medication(s)/side effects and non-pharmacologic comfort measures Outcome: Progressing   Problem: Activity: Goal: Ability to tolerate increased activity will improve Outcome: Progressing

## 2020-10-21 NOTE — Evaluation (Addendum)
Physical Therapy Evaluation Patient Details Name: Arthur [name] MRN: [id number] DOB: [birth year] Today's Date: 10/21/2020   History of Present Illness  60 yo chronically ill man admitted 10/16/20 presented with garbled speech and AMS, transaminitis, acute renal failure. During TPA administration developed  Acute seizure requiring intubation. Dx with acute encephalopathy.  PMH includes ETOH abuse and Hypertension.  Clinical Impression  Pt presents to PT with decr mobility due to weakness, decr balance, and decr cognition. Pt should make good progress with mobility and recommend CIR if pt will have some support at home after DC.     Follow Up Recommendations CIR    Equipment Recommendations  Rolling walker with 5" wheels    Recommendations for Other Services       Precautions / Restrictions Precautions Precautions: Fall      Mobility  Bed Mobility Overal bed mobility: Needs Assistance Bed Mobility: Supine to Sit;Rolling Rolling: Modified independent (Device/Increase time)   Supine to sit: Min assist     General bed mobility comments: Assist to initiate bring legs off of bed and initiate elevating trunk into sitting.    Transfers Overall transfer level: Needs assistance Equipment used: 1 person hand held assist Transfers: Sit to/from Stand Sit to Stand: Min assist         General transfer comment: Assist to bring hips up and for balance. Pt slow to rise.  Ambulation/Gait Ambulation/Gait assistance: Min assist;+2 safety/equipment Gait Distance (Feet): 20 Feet Assistive device: 1 person hand held assist Gait Pattern/deviations: Step-through pattern;Decreased step length - right;Decreased step length - left;Shuffle Gait velocity: decr Gait velocity interpretation: <1.31 ft/sec, indicative of household ambulator General Gait Details: Assist for balance and support.  Stairs            Wheelchair Mobility    Modified Rankin (Stroke Patients Only) Modified Rankin  (Stroke Patients Only) Pre-Morbid Rankin Score: No significant disability Modified Rankin: Moderately severe disability     Balance Overall balance assessment: Needs assistance Sitting-balance support: No upper extremity supported;Feet supported Sitting balance-Leahy Scale: Fair     Standing balance support: Single extremity supported Standing balance-Leahy Scale: Poor Standing balance comment: UE support and min assist for static standing                             Pertinent Vitals/Pain Pain Assessment: No/denies pain    Home Living Family/patient expects to be discharged to:: Private residence Living Arrangements: Spouse/significant other Available Help at Discharge: Family (Unsure. Wife works) Type of Home: House Home Access: Stairs to enter   Secretary/administrator of Steps: 2 Home Layout: One level Home Equipment: None Additional Comments: Unsure of accuracy    Prior Function Level of Independence: Independent         Comments: Doesn't work     Higher education careers adviser   Dominant Hand: Right    Extremity/Trunk Assessment   Upper Extremity Assessment Upper Extremity Assessment: Defer to OT evaluation  Lower Extremity Assessment Lower Extremity Assessment: Generalized weakness    Cervical / Trunk Assessment Cervical / Trunk Assessment: Normal  Communication   Communication: Expressive difficulties  Cognition Arousal/Alertness: Awake/alert Behavior During Therapy: WFL for tasks assessed/performed Overall Cognitive Status: Impaired/Different from baseline Area of Impairment: Orientation;Attention;Memory;Following commands;Safety/judgement;Awareness;Problem solving                 Orientation Level: Disoriented to;Time;Situation Current Attention Level: Sustained Memory: Decreased short-term memory Following Commands: Follows one step commands inconsistently;Follows one step commands with  increased time Safety/Judgement: Decreased awareness  of deficits;Decreased awareness of safety Awareness: Intellectual Problem Solving: Slow processing;Decreased initiation;Difficulty sequencing;Requires verbal cues;Requires tactile cues General Comments:    General Comments General comments (skin integrity, edema, etc.): VSS on RA    Exercises     Assessment/Plan    PT Assessment Patient needs continued PT services  PT Problem List Decreased strength;Decreased activity tolerance;Decreased balance;Decreased mobility;Decreased cognition;Decreased safety awareness       PT Treatment Interventions DME instruction;Gait training;Functional mobility training;Stair training;Therapeutic activities;Therapeutic exercise;Balance training;Patient/family education    PT Goals (Current goals can be found in the Care Plan section)  Acute Rehab PT Goals Patient Stated Goal: Go fishing PT Goal Formulation: With patient Time For Goal Achievement: 11/04/20 Potential to Achieve Goals: Good    Frequency Min 3X/week   Barriers to discharge        Co-evaluation               AM-PAC PT "6 Clicks" Mobility  Outcome Measure Help needed turning from your back to your side while in a flat bed without using bedrails?: None Help needed moving from lying on your back to sitting on the side of a flat bed without using bedrails?: A Little Help needed moving to and from a bed to a chair (including a wheelchair)?: A Little Help needed standing up from a chair using your arms (e.g., wheelchair or bedside chair)?: A Little Help needed to walk in hospital room?: A Little Help needed climbing 3-5 steps with a railing? : A Little 6 Click Score: 19    End of Session Equipment Utilized During Treatment: Gait belt Activity Tolerance: Patient tolerated treatment well Patient left: in chair;with call bell/phone within reach;with chair alarm set Nurse Communication: Mobility status PT Visit Diagnosis: Unsteadiness on feet (R26.81);Other abnormalities of gait  and mobility (R26.89);Muscle weakness (generalized) (M62.81)    Time:  -      Charges:              Skip Mayer PT Acute Rehabilitation Services Pager [phone] Office [phone]   Angelina Ok [facility] 10/21/2020, 5:47 PM

## 2020-10-21 NOTE — Evaluation (Signed)
Occupational Therapy Evaluation Patient Details Name: Arthur [name] MRN: [id number] DOB: [birth year] Today's Date: 10/21/2020    History of Present Illness 60 yo male presenting to ED on 5/22 with garbled speech and AMS, transaminitis, acute renal failure. CT negative. During tPA administration developed bleeding and acute seizure requiring intubation. Dx with acute encephalopathy.  Repeat CT on 5/22 neg. 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   Clinical Impression   PTA, pt was living with his wife and reports he was independent with ADLs and light IADLs; unsure of reliability of information as pt with decreased cognition and orientation. Pt currently requiring Min A for UB ADLs, Mod-Max A for LB ADLs, and Min A +2 for short distance mobility. Despite fatigue, pt motivated to participate in therapy. Pt presenting with decreased balance, coordination, cognition, and activity tolerance impacting his safe performance of ADLs. Pt would benefit from further acute OT to facilitate safe dc. Recommend dc to CIR for further OT to optimize safety, independence with ADLs, and return to PLOF.     Follow Up Recommendations  CIR    Equipment Recommendations  3 in 1 bedside commode    Recommendations for Other Services PT consult;Rehab consult     Precautions / Restrictions Precautions Precautions: Fall      Mobility Bed Mobility Overal bed mobility: Needs Assistance Bed Mobility: Supine to Sit     Supine to sit: Min guard;HOB elevated     General bed mobility comments: Min Guard A and significant time    Transfers Overall transfer level: Needs assistance Equipment used: 1 person hand held assist Transfers: Sit to/from Stand Sit to Stand: Min assist         General transfer comment: Min A for gaining balance due to posterior lean. Use of bed to support behind legs. Assist for weight shift forward    Balance Overall balance assessment: Needs  assistance Sitting-balance support: No upper extremity supported;Feet supported Sitting balance-Leahy Scale: Fair     Standing balance support: Single extremity supported Standing balance-Leahy Scale: Poor Standing balance comment: UE support and min assist for static standing                           ADL either performed or assessed with clinical judgement   ADL Overall ADL's : Needs assistance/impaired Eating/Feeding: Set up;Sitting   Grooming: Set up;Supervision/safety;Sitting   Upper Body Bathing: Minimal assistance;Sitting   Lower Body Bathing: Moderate assistance;Sit to/from stand   Upper Body Dressing : Minimal assistance;Sitting   Lower Body Dressing: Maximal assistance;Sit to/from stand Lower Body Dressing Details (indicate cue type and reason): Max A for donning socks             Functional mobility during ADLs: Minimal assistance;+2 for physical assistance;+2 for safety/equipment General ADL Comments: Pt presenting with decreased cognition, balance, strength, and coordination.     Vision Baseline Vision/History: No visual deficits Patient Visual Report: Diplopia;Blurring of vision (Unsure of relability) Vision Assessment?: Yes Eye Alignment: Impaired (comment) (slight dysconjugate gaze) Ocular Range of Motion: Within Functional Limits Tracking/Visual Pursuits: Decreased smoothness of vertical tracking;Decreased smoothness of horizontal tracking Convergence: Impaired (comment) (Poor coorindation) Additional Comments: Pt with dysconjugate gaze, decreased smooth tracking, and reports diplopia.     Perception     Praxis      Pertinent Vitals/Pain Pain Assessment: No/denies pain     Hand Dominance Right   Extremity/Trunk Assessment Upper Extremity Assessment Upper Extremity Assessment: Generalized  weakness;RUE deficits/detail;LUE deficits/detail RUE Deficits / Details: Poor coorindation. Unable to performing finger opposition. Decreased  grasp strength. RUE Coordination: decreased fine motor;decreased gross motor LUE Deficits / Details: Poor coorindation. Poor grasp strength and AROM (0-90* for forward flexion) LUE Coordination: decreased fine motor;decreased gross motor   Lower Extremity Assessment Lower Extremity Assessment: Defer to PT evaluation   Cervical / Trunk Assessment Cervical / Trunk Assessment: Normal   Communication Communication Communication: Expressive difficulties   Cognition Arousal/Alertness: Awake/alert Behavior During Therapy: WFL for tasks assessed/performed Overall Cognitive Status: Impaired/Different from baseline Area of Impairment: Orientation;Attention;Memory;Following commands;Safety/judgement;Awareness;Problem solving                 Orientation Level: Disoriented to;Time;Situation;Place Current Attention Level: Sustained Memory: Decreased short-term memory Following Commands: Follows one step commands inconsistently;Follows one step commands with increased time Safety/Judgement: Decreased awareness of deficits;Decreased awareness of safety Awareness: Intellectual Problem Solving: Slow processing;Decreased initiation;Difficulty sequencing;Requires verbal cues;Requires tactile cues General Comments: Pt requiring significant time for processing both in conversation and motor movements. Pt presenting with decreased atttention, memory, and awareness. Agreeable to therapy. Pt not oriented to place or situation. When asked for year he reported "19", "18", "1918". oriented to 2022   General Comments  VSS on RA.    Exercises     Shoulder Instructions      Home Living Family/patient expects to be discharged to:: Private residence Living Arrangements: Spouse/significant other Available Help at Discharge: Family;Available 24 hours/day Type of Home: House Home Access: Stairs to enter [employer] of Steps: 2   Home Layout: One level     Bathroom Shower/Tub: Retail banker: Handicapped height     Home Equipment: None          Prior Functioning/Environment Level of Independence: Independent        Comments: Doesn't work. Enjoys fishing        OT Problem List: Decreased strength;Decreased range of motion;Decreased activity tolerance;Impaired balance (sitting and/or standing);Decreased safety awareness;Decreased knowledge of use of DME or AE;Decreased knowledge of precautions      OT Treatment/Interventions: Self-care/ADL training;Therapeutic exercise;Energy conservation;DME and/or AE instruction;Therapeutic activities;Patient/family education    OT Goals(Current goals can be found in the care plan section) Acute Rehab OT Goals Patient Stated Goal: Go fishing OT Goal Formulation: With patient Time For Goal Achievement: 11/04/20 Potential to Achieve Goals: Good  OT Frequency: Min 2X/week   Barriers to D/C:            Co-evaluation              AM-PAC OT "6 Clicks" Daily Activity     Outcome Measure Help from another person eating meals?: None Help from another person taking care of personal grooming?: A Little Help from another person toileting, which includes using toliet, bedpan, or urinal?: A Little Help from another person bathing (including washing, rinsing, drying)?: A Lot Help from another person to put on and taking off regular upper body clothing?: A Little Help from another person to put on and taking off regular lower body clothing?: A Lot 6 Click Score: 17   End of Session Equipment Utilized During Treatment: Gait belt Nurse Communication: Mobility status  Activity Tolerance: Patient tolerated treatment well Patient left: in chair;with call bell/phone within reach;with chair alarm set  OT Visit Diagnosis: Unsteadiness on feet (R26.81);Other abnormalities of gait and mobility (R26.89);Muscle weakness (generalized) (M62.81)                Time: 7408-1448 OT  Time Calculation (min): 23  min Charges:  OT General Charges $OT Visit: 1 Visit OT Evaluation $OT Eval Moderate Complexity: 1 Mod OT Treatments $Self Care/Home Management : 8-22 mins  Lakyn Alsteen MSOT, OTR/L Acute Rehab Pager: [phone] Office: [phone]  Arthur [name] 10/21/2020, 5:22 PM

## 2020-10-22 DIAGNOSIS — I10 Essential (primary) hypertension: Secondary | ICD-10-CM

## 2020-10-22 DIAGNOSIS — N179 Acute kidney failure, unspecified: Secondary | ICD-10-CM

## 2020-10-22 DIAGNOSIS — E1369 Other specified diabetes mellitus with other specified complication: Secondary | ICD-10-CM

## 2020-10-22 LAB — MAGNESIUM: Magnesium: 2.1 mg/dL (ref 1.7–2.4)

## 2020-10-22 LAB — GLUCOSE, CAPILLARY
Glucose-Capillary: 88 mg/dL (ref 70–99)
Glucose-Capillary: 168 mg/dL — ABNORMAL HIGH (ref 70–99)
Glucose-Capillary: 103 mg/dL — ABNORMAL HIGH (ref 70–99)
Glucose-Capillary: 186 mg/dL — ABNORMAL HIGH (ref 70–99)
Glucose-Capillary: 98 mg/dL (ref 70–99)

## 2020-10-22 LAB — BASIC METABOLIC PANEL
Anion gap: 8 (ref 5–15)
BUN: 8 mg/dL (ref 6–20)
CO2: 25 mmol/L (ref 22–32)
Calcium: 7.9 mg/dL — ABNORMAL LOW (ref 8.9–10.3)
Chloride: 107 mmol/L (ref 98–111)
Creatinine, Ser: 1.43 mg/dL — ABNORMAL HIGH (ref 0.61–1.24)
GFR, Estimated: 57 mL/min — ABNORMAL LOW (ref >60)
Glucose, Bld: 106 mg/dL — ABNORMAL HIGH (ref 70–99)
Potassium: 3.2 mmol/L — ABNORMAL LOW (ref 3.5–5.1)
Sodium: 140 mmol/L (ref 135–145)

## 2020-10-22 LAB — CULTURE, BLOOD (ROUTINE X 2): Culture: NO GROWTH

## 2020-10-22 LAB — CULTURE, BLOOD (SINGLE)
Culture: NO GROWTH
Special Requests: ADEQUATE

## 2020-10-22 LAB — PHOSPHORUS: Phosphorus: 3.9 mg/dL (ref 2.5–4.6)

## 2020-10-22 MED ORDER — LACOSAMIDE 50 MG PO TABS
50.0000 mg | ORAL_TABLET | Freq: Two times a day (BID) | ORAL | Status: DC
  Administered 2020-10-22 – 2020-10-25 (×6): 50 mg via ORAL
  Filled 2020-10-22 (×6): qty 1

## 2020-10-22 MED ORDER — FOLIC ACID 1 MG PO TABS
1.0000 mg | ORAL_TABLET | Freq: Every day | ORAL | Status: DC
  Administered 2020-10-22 – 2020-10-25 (×4): 1 mg via ORAL
  Filled 2020-10-22 (×4): qty 1

## 2020-10-22 MED ORDER — LORAZEPAM 2 MG/ML IJ SOLN: 1.0000 mg | INTRAMUSCULAR | Status: DC | PRN

## 2020-10-22 MED ORDER — LORAZEPAM 1 MG PO TABS: 1.0000 mg | ORAL_TABLET | ORAL | Status: DC | PRN

## 2020-10-22 MED ORDER — LEVETIRACETAM 500 MG PO TABS
500.0000 mg | ORAL_TABLET | Freq: Two times a day (BID) | ORAL | Status: DC
  Administered 2020-10-22 – 2020-10-25 (×6): 500 mg via ORAL
  Filled 2020-10-22 (×6): qty 1

## 2020-10-22 MED ORDER — POTASSIUM CHLORIDE CRYS ER 20 MEQ PO TBCR
40.0000 meq | EXTENDED_RELEASE_TABLET | Freq: Once | ORAL | Status: AC
  Administered 2020-10-22: 40 meq via ORAL
  Filled 2020-10-22: qty 2

## 2020-10-22 MED ORDER — THIAMINE HCL 100 MG PO TABS
100.0000 mg | ORAL_TABLET | Freq: Every day | ORAL | Status: DC
  Administered 2020-10-22 – 2020-10-25 (×4): 100 mg via ORAL
  Filled 2020-10-22 (×4): qty 1

## 2020-10-22 MED ORDER — ADULT MULTIVITAMIN W/MINERALS CH
1.0000 | ORAL_TABLET | Freq: Every day | ORAL | Status: DC
  Administered 2020-10-22 – 2020-10-25 (×4): 1 via ORAL
  Filled 2020-10-22 (×4): qty 1

## 2020-10-22 MED ORDER — THIAMINE HCL 100 MG/ML IJ SOLN: 100.0000 mg | Freq: Every day | INTRAMUSCULAR | Status: DC

## 2020-10-22 NOTE — Progress Notes (Signed)
PROGRESS NOTE    Arthur [name]  MKL:[id number] DOB: [birth year] DOA: 10/16/2020 PCP: Dewayne Hatch, MD   Brief Narrative:  HPI on 10/16/2020 by Ms. 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  He was able to follow some commands and moved all extremities on arrival.  Code stroke underway.  Initial CTH neg.   tPA was started then patient started to have tremulous movements and blood coming from mouth (noted to have cheek laceration) concerning for seizure activity, and given ETOH hx, tPA was therefore stopped.  Suspected to have possible ETOH withdrawal seizures.  He only received 5.1 mg of tPA at 1523 on 5/22.  CTA head/ neck neg for LVO.  Given declining mental status, he was intubated for airway protection and placed on propofol for sedation.  Per his wife via phone, he drinks "a lot" per wife, including beer and liquor, but only had one beer yesterday/ 5/21, none today.    Vitals noted 102.9, BP 177/123.  Labs significant for Na 132, glucose 181, BUN 37, sCr 3.50 (previously 1.35 in 07/2020), normal WBC, plt 107, normal coags, alk phos 420, AST/ ALT 1350/ 405, t.bili 32.  COVID and remaining labs pending.  CXR neg.  Ceftriaxone and vanc to be started after cultures obtained.  Seen by Neurology given initial code stroke.  PCCM called to admit.   Interim history Patient mated with acute encephalopathy thought to initially be due to stroke and was given tPA however stopped after patient developed seizure activity.  He was intubated for airway protection and mental status decline.  Was also noted to have AKI with transaminitis.  Neurology was consulted.  He was able to be extubated on 5/26.  TRH assumed care of patient on 5/28.  Now pending CIR. Assessment & Plan   Acute metabolic  encephalopathy -Possibly secondary to seizure versus infection -CT head showed no acute abnormality -MRI showed abnormal diffusion signal in the left hippocampus.  Primary differential considerations are seizure effect and subacute infarct.  Multiple chronic infarcts. -Echocardiogram EF of 45 to 50%, LV demonstrates global hypokinesis.  Mild LVH.  Grade 1 diastolic dysfunction. -Patient was noted to have fever. -Cultures have been negative, HSV PCR negative as well (patient did have lumbar puncture on 5/23) -He was initially placed on antibiotics with vancomycin and ceftriaxone on admission as well as acyclovir however these medications have been discontinued -Mental status appears to have improved.  Patient is slow to respond but is alert and oriented x3  Seizure disorder -CT and MRI as above -EEG on 5/23 showed subclinical seizures -Neurology consulted and appreciated, recommendations are to continue Keppra 500 mg twice daily, Vimpat 50 mg twice daily.  Follow-up with neurology in 3 months.Continue seizure precautions, no driving for 6 months.  Acute respiratory failure with hypercarbia -Patient intubated due to mental status decline as well as airway protection.  Was extubated on 5/26. -Currently on room air  Acute kidney injury -Suspected to be due to hypovolemia -Creatinine peaked to 3.50, now down to 1.43 -Continue to monitor CMP  Thrombocytopenia -Suspect secondary to heavy alcohol use and possible drug side effects as patient was on Dilantin, Keppra, acyclovir, lites and lipid, Protonix which I will have a potential to affect platelet count -Platelets as low as 39  however trending upward, currently up to 70 -Continue to monitor CBC  Transaminitis -Initially patient had a meld score of 24 -Right upper quadrant ultrasound showed mild hepatic steatosis -Patient was placed on lactulose however this is now been discontinued -LFTs trending downward, continue to monitor  CMP  Diabetes mellitus, type II -Hemoglobin A1c 6.8 -Continue insulin sliding scale and CBG monitoring  Fever without leukocytosis -As above, patient was initially placed on antibiotics however these were discontinued as cultures have shown no growth to date  Essential hypertension -Continue amlodipine  Acute blood loss anemia -Suspected to be secondary to post seizure and tPA administration.  Patient was also noted to have oropharyngeal bleeding which has not resolved -No further evidence of bleeding -Continue to monitor CBC and transfuse if hemoglobin drops below 7  Hyponatremia -Resolved  Hypomagnesemia -Resolved with replacement, continue to monitor  Hypokalemia -Will replace and continue to monitor  Diarrhea -Patient currently afebrile with no leukocytosis -He has received lactulose as well as stool softeners during hospitalization -Will continue to monitor  DVT Prophylaxis SCDs  Code Status: Full  Family Communication: None at bedside  Disposition Plan:  Status is: Inpatient  Remains inpatient appropriate because:Inpatient level of care appropriate due to severity of illness   Dispo: The patient is from: Home              Anticipated d/c is to: CIR              Patient currently is not medically stable to d/c.   Difficult to place patient No   Consultants PCCM Neurology Inpatient rehab  Procedures  Abdominal ultrasound Lumbar puncture  Echocardiogram EEG  Antibiotics   Anti-infectives (From admission, onward)   Start     Dose/Rate Route Frequency Ordered Stop   10/20/20 1000  acyclovir (ZOVIRAX) 570 mg in dextrose 5 % 100 mL IVPB  Status:  Discontinued        10 mg/kg  57 kg 111.4 mL/hr over 60 Minutes Intravenous Every 12 hours 10/20/20 0701 10/20/20 0824   10/18/20 1100  acyclovir (ZOVIRAX) 570 mg in dextrose 5 % 100 mL IVPB  Status:  Discontinued        10 mg/kg  57 kg 111.4 mL/hr over 60 Minutes Intravenous Every 24 hours 10/18/20 1009  10/20/20 0701   10/17/20 1100  vancomycin (VANCOREADY) IVPB 1250 mg/250 mL  Status:  Discontinued        1,250 mg 166.7 mL/hr over 90 Minutes Intravenous STAT 10/17/20 1006 10/17/20 1014   10/17/20 1100  linezolid (ZYVOX) IVPB 600 mg  Status:  Discontinued        600 mg 300 mL/hr over 60 Minutes Intravenous Every 12 hours 10/17/20 1014 10/20/20 0828   10/16/20 1715  cefTRIAXone (ROCEPHIN) 2 g in sodium chloride 0.9 % 100 mL IVPB  Status:  Discontinued        2 g 200 mL/hr over 30 Minutes Intravenous Every 12 hours 10/16/20 1708 10/20/20 0828   10/16/20 1715  vancomycin (VANCOREADY) IVPB 1500 mg/300 mL  Status:  Discontinued        1,500 mg 150 mL/hr over 120 Minutes Intravenous  Once 10/16/20 1708 10/17/20 0732   10/16/20 1710  vancomycin variable dose per unstable renal function (pharmacist dosing)  Status:  Discontinued         Does not apply See admin instructions 10/16/20 1710 10/17/20 1014   10/16/20 1700  cefTRIAXone (ROCEPHIN) 2 g in sodium chloride 0.9 % 100 mL IVPB  Status:  Discontinued        2 g 200 mL/hr over 30 Minutes Intravenous  Once 10/16/20 1655 10/16/20 1708   10/16/20 1700  vancomycin (VANCOREADY) IVPB 1000 mg/200 mL  Status:  Discontinued        1,000 mg 200 mL/hr over 60 Minutes Intravenous  Once 10/16/20 1655 10/16/20 1708   10/16/20 1700  acyclovir (ZOVIRAX) 570 mg in dextrose 5 % 100 mL IVPB  Status:  Discontinued        10 mg/kg  57.2 kg 111.4 mL/hr over 60 Minutes Intravenous  Once 10/16/20 1655 10/16/20 1707      Subjective:   Arthur [name] seen and examined today.  Patient with no complaints this morning. Feels weak. Denies current chest pain, shortness of breath, abdominal pain, N/V, dizziness, headache.   Objective:   Vitals:   10/22/20 0421 10/22/20 0756 10/22/20 0820 10/22/20 0952  BP: (!) 152/104 (!) 143/98 (!) 186/117 (!) 148/100  Pulse: 100 (!) 105 (!) 109 (!) 101  Resp: 20 (!) 22  12  Temp:  98.8 F (37.1 C)  98.5 F (36.9 C)  TempSrc:   Oral  Oral  SpO2: 100% 100%  99%  Weight:      Height:        Intake/Output Summary (Last 24 hours) at 10/22/2020 1142 Last data filed at 10/22/2020 1025 Gross per 24 hour  Intake 904 ml  Output 1850 ml  Net -946 ml   Filed Weights   10/17/20 0500 10/20/20 0500 10/21/20 0500  Weight: 57 kg 57.6 kg 57.4 kg    Exam  General: Well developed, chronically ill appearing, NAD  HEENT: NCAT, mucous membranes moist.   Cardiovascular: S1 S2 auscultated, tachycardic, no murmur appreciated   Respiratory: Clear to auscultation bilaterally, no wheezing  Abdomen: Soft, nontender, nondistended, + bowel sounds  Extremities: warm dry without cyanosis clubbing or edema  Neuro: AAOx3, slow to respond, generally weak in upper/lower ext bilaterally- but equal  Skin: Without rashes exudates or nodules  Psych: Appropriate mood and affect   Data Reviewed: I have personally reviewed following labs and imaging studies  CBC: Recent Labs  Lab 10/16/20 1505 10/16/20 1508 10/17/20 0151 10/18/20 0735 10/19/20 0522 10/20/20 0454 10/21/20 0500  WBC 8.1  --  6.8 8.4 7.2 7.7 10.9*  NEUTROABS 6.8  --   --   --   --   --   --   HGB 12.5*   < > 10.6* 10.2* 8.4* 9.1* 9.4*  HCT 37.1*   < > 30.7* 31.0* 26.1* 29.4* 28.6*  MCV 78.9*  --  77.3* 81.6 82.6 85.7 80.8  PLT 107*  --  74* 54* 39* 47* 70*   < > = values in this interval not displayed.   Basic Metabolic Panel: Recent Labs  Lab 10/18/20 0735 10/18/20 2049 10/19/20 0522 10/20/20 0454 10/21/20 0500 10/22/20 0125  NA 138  --  142 142 142 140  K 4.1  --  3.4* 3.6 3.3* 3.2*  CL 108  --  114* 113* 111 107  CO2 19*  --  23 21* 23 25  GLUCOSE 130*  --  120* 92 83 106*  BUN 22*  --  $R'14 10 9 8  'Ho$ CREATININE 3.33*  --  2.48* 1.82* 1.75* 1.43*  CALCIUM 7.8*  --  7.7* 8.0* 8.2* 7.9*  MG 2.1 1.7 1.7  --  1.2* 2.1  PHOS 4.6 3.9 3.2  --  2.3* 3.9   GFR: Estimated Creatinine Clearance: 42.2  mL/min (A) (by C-G formula based on SCr of 1.43 mg/dL  (H)). Liver Function Tests: Recent Labs  Lab 10/16/20 1505 10/17/20 0151 10/18/20 0735 10/19/20 0522  AST 1,350* 1,035* 476* 262*  ALT 405* 309* 207* 134*  ALKPHOS 420* 345* 299* 218*  BILITOT 3.2* 2.2* 2.3* 1.9*  PROT 7.1 5.9* 6.1* 5.2*  ALBUMIN 2.7* 2.2* 2.2* 1.7*   Recent Labs  Lab 10/16/20 2232  LIPASE 145*   Recent Labs  Lab 10/16/20 2232 10/18/20 0735  AMMONIA 70* 59*   Coagulation Profile: Recent Labs  Lab 10/16/20 1505 10/17/20 0151  INR 1.2 1.4*   Cardiac Enzymes: Recent Labs  Lab 10/16/20 2232  CKTOTAL 446*   BNP (last 3 results) No results for input(s): PROBNP in the last 8760 hours. HbA1C: No results for input(s): HGBA1C in the last 72 hours. CBG: Recent Labs  Lab 10/21/20 1630 10/21/20 1931 10/21/20 2318 10/22/20 0452 10/22/20 0755  GLUCAP 128* 108* 95 88 168*   Lipid Profile: No results for input(s): CHOL, HDL, LDLCALC, TRIG, CHOLHDL, LDLDIRECT in the last 72 hours. Thyroid Function Tests: No results for input(s): TSH, T4TOTAL, FREET4, T3FREE, THYROIDAB in the last 72 hours. Anemia Panel: No results for input(s): VITAMINB12, FOLATE, FERRITIN, TIBC, IRON, RETICCTPCT in the last 72 hours. Urine analysis:    Component Value Date/Time   COLORURINE YELLOW 10/02/2019 2120   APPEARANCEUR CLEAR 10/02/2019 2120   LABSPEC 1.032 (H) 10/02/2019 2120   PHURINE 6.0 10/02/2019 2120   GLUCOSEU NEGATIVE 10/02/2019 2120   HGBUR NEGATIVE 10/02/2019 2120   BILIRUBINUR NEGATIVE 10/02/2019 2120   KETONESUR 5 (A) 10/02/2019 2120   PROTEINUR 100 (A) 10/02/2019 2120   UROBILINOGEN 1.0 10/05/2007 1713   NITRITE NEGATIVE 10/02/2019 2120   LEUKOCYTESUR NEGATIVE 10/02/2019 2120   Sepsis Labs: $[id number]$ (procalcitonin:4,lacticidven:4)  ) Recent Results (from the past 240 hour(s))  Resp Panel by RT-PCR (Flu A&B, Covid) Nasopharyngeal Swab     Status: None   Collection Time: 10/16/20  5:20 PM   Specimen: Nasopharyngeal Swab; Nasopharyngeal(NP) swabs in  vial transport medium  Result Value Ref Range Status   SARS Coronavirus 2 by RT PCR NEGATIVE NEGATIVE Final    Comment: (NOTE) SARS-CoV-2 target nucleic acids are NOT DETECTED.  The SARS-CoV-2 RNA is generally detectable in upper respiratory specimens during the acute phase of infection. The lowest concentration of SARS-CoV-2 viral copies this assay can detect is 138 copies/mL. A negative result does not preclude SARS-Cov-2 infection and should not be used as the sole basis for treatment or other patient management decisions. A negative result may occur with  improper specimen collection/handling, submission of specimen other than nasopharyngeal swab, presence of viral mutation(s) within the areas targeted by this assay, and inadequate number of viral copies(<138 copies/mL). A negative result must be combined with clinical observations, patient history, and epidemiological information. The expected result is Negative.  Fact Sheet for Patients:  EntrepreneurPulse.com.au  Fact Sheet for Healthcare Providers:  IncredibleEmployment.be  This test is no t yet approved or cleared by the [place] FDA and  has been authorized for detection and/or diagnosis of SARS-CoV-2 by FDA under an Emergency Use Authorization (EUA). This EUA will remain  in effect (meaning this test can be used) for the duration of the COVID-19 declaration under Section 564(b)(1) of the Act, 21 U.S.C.section 360bbb-3(b)(1), unless the authorization is terminated  or revoked sooner.       Influenza A by PCR NEGATIVE NEGATIVE Final   Influenza B by PCR NEGATIVE NEGATIVE Final  Comment: (NOTE) The Xpert Xpress SARS-CoV-2/FLU/RSV plus assay is intended as an aid in the diagnosis of influenza from Nasopharyngeal swab specimens and should not be used as a sole basis for treatment. Nasal washings and aspirates are unacceptable for Xpert Xpress SARS-CoV-2/FLU/RSV testing.  Fact  Sheet for Patients: EntrepreneurPulse.com.au  Fact Sheet for Healthcare Providers: IncredibleEmployment.be  This test is not yet approved or cleared by the [place] FDA and has been authorized for detection and/or diagnosis of SARS-CoV-2 by FDA under an Emergency Use Authorization (EUA). This EUA will remain in effect (meaning this test can be used) for the duration of the COVID-19 declaration under Section 564(b)(1) of the Act, 21 U.S.C. section 360bbb-3(b)(1), unless the authorization is terminated or revoked.  Performed at [facility], [place] [address] [place], [place] [address]   MRSA PCR Screening     Status: None   Collection Time: 10/16/20  9:13 PM   Specimen: Nasal Mucosa; Nasopharyngeal  Result Value Ref Range Status   MRSA by PCR NEGATIVE NEGATIVE Final    Comment:        The GeneXpert MRSA Assay (FDA approved for NASAL specimens only), is one component of a comprehensive MRSA colonization surveillance program. It is not intended to diagnose MRSA infection nor to guide or monitor treatment for MRSA infections. Performed at [facility], [place] [address] [place], [place] [address]   Culture, blood (routine x 2)     Status: None   Collection Time: 10/16/20 10:03 PM   Specimen: BLOOD  Result Value Ref Range Status   Specimen Description BLOOD SITE NOT SPECIFIED  Final   Special Requests   Final    BOTTLES DRAWN AEROBIC AND ANAEROBIC Blood Culture results may not be optimal due to an inadequate volume of blood received in culture bottles   Culture   Final    NO GROWTH 5 DAYS Performed at [facility], [place] [address] [place], [place] [address]    Report Status 10/22/2020 FINAL  Final  Culture, blood (single)     Status: None   Collection Time: 10/16/20 10:03 PM   Specimen: BLOOD  Result Value Ref Range Status   Specimen Description BLOOD SITE NOT SPECIFIED  Final   Special Requests   Final    BOTTLES  DRAWN AEROBIC ONLY Blood Culture adequate volume   Culture   Final    NO GROWTH 5 DAYS Performed at [facility], [place] [address] [place], [place] [address]    Report Status 10/22/2020 FINAL  Final  CSF culture w Stat Gram Stain     Status: None   Collection Time: 10/17/20  4:01 PM   Specimen: CSF; Cerebrospinal Fluid  Result Value Ref Range Status   Specimen Description CSF  Final   Special Requests NONE  Final   Gram Stain   Final    WBC PRESENT,BOTH PMN AND MONONUCLEAR NO ORGANISMS SEEN CYTOSPIN SMEAR    Culture   Final    NO GROWTH Performed at [facility], [place] [address], [place], [place] [address]    Report Status 10/21/2020 FINAL  Final      Radiology Studies: No results found.   Scheduled Meds: .  stroke: mapping our early stages of recovery book   Does not apply Once  . amLODipine  10 mg Oral Daily  . Chlorhexidine Gluconate Cloth  6 each Topical Q0600  . cloNIDine  0.1 mg Oral BID  . feeding supplement  237 mL Oral TID BM  . insulin aspart  0-9 Units Subcutaneous Q4H  . mouth rinse  15 mL Mouth Rinse BID  . sodium chloride flush  10-40 mL Intracatheter Q12H   Continuous Infusions: . lacosamide (VIMPAT) IV 50 mg (10/22/20 1049)  . lactated ringers 10 mL/hr at 10/22/20 0030  . levETIRAcetam 500 mg (10/22/20 0552)     LOS: 6 days   Time Spent in minutes   45 minutes  Kynan Peasley D.O. on 10/22/2020 at 11:42 AM  Between 7am to 7pm - Please see pager noted on [email or web address]  After 7pm go to [email or web address]  And look for the night coverage person covering for me after hours  [facility] Office  [phone]

## 2020-10-22 NOTE — Progress Notes (Signed)
OT Cancellation Note  Patient Details Name: Arthur [name] MRN: [id number] DOB: [birth year]   Cancelled Treatment:    Reason Eval/Treat Not Completed: Other (comment)> attempted to see pt x 2 today. Upon first attempt, pt soiled in BM when covers pulled back but stated that he had not called for assist from nursing staff. OT informed NT and staff went in to clean pt up. Upon second attempt, pt again soiled in BM but had not called NT for assist. OT assisted pt in using call bell to call for NT. OT will follow up next available time [name] 10/22/2020, 11:29 AM

## 2020-10-22 NOTE — Progress Notes (Signed)
Inpatient Rehab Admissions Coordinator:    I met with Pt. And spoke with Pt.'s wife over the phone to discuss potential CIR admission. Pt. Is uninsured and cost was reviewed with Pt. And wife. They state that they feel that they are unable to afford CIR and would prefer for Pt. To do his therapy at home if possible. CIR will sign off at this time.   Clemens Catholic, [place], [place] Admissions Coordinator  [phone] ([place]) [phone] (office)

## 2020-10-22 NOTE — Progress Notes (Signed)
Inpatient Rehab Admissions Coordinator Note:   Per therapy recommendations, pt was screened for CIR candidacy by Demica Zook, MS CCC-SLP. At this time, Pt. Appears to have functional decline and is a potential  candidate for CIR. Will place order for rehab consult per protocol.  Please contact me with questions.   Addam Goeller, MS, CCC-SLP Rehab Admissions Coordinator  [phone] (celll) [phone] (office)  

## 2020-10-23 LAB — COMPREHENSIVE METABOLIC PANEL
ALT: 109 U/L — ABNORMAL HIGH (ref 0–44)
AST: 203 U/L — ABNORMAL HIGH (ref 15–41)
Albumin: 2.4 g/dL — ABNORMAL LOW (ref 3.5–5.0)
Alkaline Phosphatase: 468 U/L — ABNORMAL HIGH (ref 38–126)
Anion gap: 8 (ref 5–15)
BUN: 15 mg/dL (ref 6–20)
CO2: 22 mmol/L (ref 22–32)
Calcium: 8 mg/dL — ABNORMAL LOW (ref 8.9–10.3)
Chloride: 108 mmol/L (ref 98–111)
Creatinine, Ser: 1.43 mg/dL — ABNORMAL HIGH (ref 0.61–1.24)
GFR, Estimated: 57 mL/min — ABNORMAL LOW (ref >60)
Glucose, Bld: 110 mg/dL — ABNORMAL HIGH (ref 70–99)
Potassium: 3.9 mmol/L (ref 3.5–5.1)
Sodium: 138 mmol/L (ref 135–145)
Total Bilirubin: 1.3 mg/dL — ABNORMAL HIGH (ref 0.3–1.2)
Total Protein: 6.8 g/dL (ref 6.5–8.1)

## 2020-10-23 LAB — CBC
HCT: 28.3 % — ABNORMAL LOW (ref 39.0–52.0)
Hemoglobin: 9.4 g/dL — ABNORMAL LOW (ref 13.0–17.0)
MCH: 26.9 pg (ref 26.0–34.0)
MCHC: 33.2 g/dL (ref 30.0–36.0)
MCV: 81.1 fL (ref 80.0–100.0)
Platelets: 107 10*3/uL — ABNORMAL LOW (ref 150–400)
RBC: 3.49 MIL/uL — ABNORMAL LOW (ref 4.22–5.81)
RDW: 15.6 % — ABNORMAL HIGH (ref 11.5–15.5)
WBC: 10.5 10*3/uL (ref 4.0–10.5)
nRBC: 1 % — ABNORMAL HIGH (ref 0.0–0.2)

## 2020-10-23 LAB — GLUCOSE, CAPILLARY
Glucose-Capillary: 109 mg/dL — ABNORMAL HIGH (ref 70–99)
Glucose-Capillary: 122 mg/dL — ABNORMAL HIGH (ref 70–99)
Glucose-Capillary: 124 mg/dL — ABNORMAL HIGH (ref 70–99)
Glucose-Capillary: 77 mg/dL (ref 70–99)
Glucose-Capillary: 79 mg/dL (ref 70–99)

## 2020-10-23 MED ORDER — INSULIN ASPART 100 UNIT/ML IJ SOLN: 0.0000 [IU] | Freq: Every day | INTRAMUSCULAR | Status: DC

## 2020-10-23 MED ORDER — INSULIN ASPART 100 UNIT/ML IJ SOLN
0.0000 [IU] | Freq: Three times a day (TID) | INTRAMUSCULAR | Status: DC
Start: 2020-10-24 — End: 2020-10-25

## 2020-10-23 NOTE — Plan of Care (Signed)
  Problem: Activity: Goal: Ability to tolerate increased activity will improve Outcome: Progressing   Problem: Respiratory: Goal: Ability to maintain a clear airway and adequate ventilation will improve Outcome: Progressing   Problem: Role Relationship: Goal: Method of communication will improve Outcome: Progressing   Problem: Education: Goal: Knowledge of General Education information will improve Description: Including pain rating scale, medication(s)/side effects and non-pharmacologic comfort measures Outcome: Progressing   Problem: Health Behavior/Discharge Planning: Goal: Ability to manage health-related needs will improve Outcome: Progressing   Problem: Clinical Measurements: Goal: Ability to maintain clinical measurements within normal limits will improve Outcome: Progressing Goal: Will remain free from infection Outcome: Progressing Goal: Diagnostic test results will improve Outcome: Progressing Goal: Respiratory complications will improve Outcome: Progressing Goal: Cardiovascular complication will be avoided Outcome: Progressing   Problem: Activity: Goal: Risk for activity intolerance will decrease Outcome: Progressing   Problem: Nutrition: Goal: Adequate nutrition will be maintained Outcome: Progressing   Problem: Coping: Goal: Level of anxiety will decrease Outcome: Progressing   Problem: Elimination: Goal: Will not experience complications related to bowel motility Outcome: Progressing Goal: Will not experience complications related to urinary retention Outcome: Progressing   Problem: Pain Managment: Goal: General experience of comfort will improve Outcome: Progressing   Problem: Safety: Goal: Ability to remain free from injury will improve Outcome: Progressing   Problem: Skin Integrity: Goal: Risk for impaired skin integrity will decrease Outcome: Progressing   Problem: Education: Goal: Knowledge of disease or condition will  improve Outcome: Progressing Goal: Knowledge of secondary prevention will improve Outcome: Progressing Goal: Knowledge of patient specific risk factors addressed and post discharge goals established will improve Outcome: Progressing   Problem: Coping: Goal: Will verbalize positive feelings about self Outcome: Progressing Goal: Will identify appropriate support needs Outcome: Progressing   Problem: Health Behavior/Discharge Planning: Goal: Ability to manage health-related needs will improve Outcome: Progressing   Problem: Self-Care: Goal: Ability to participate in self-care as condition permits will improve Outcome: Progressing Goal: Verbalization of feelings and concerns over difficulty with self-care will improve Outcome: Progressing Goal: Ability to communicate needs accurately will improve Outcome: Progressing

## 2020-10-23 NOTE — TOC CAGE-AID Note (Signed)
Transition of Care [facility]) - CAGE-AID Screening   Patient Details  Name: Arthur [name] MRN: [id number] Date of Birth: August 21, 1960  [facility] CM/SW Contact:    Lawerance Sabal, RN Phone Number: 10/23/2020, 9:14 AM   Clinical Narrative:    CAGE-AID Screening:    Have You Ever Felt You Ought to Cut Down on Your Drinking or Drug Use?: Yes Have People Annoyed You By Critizing Your Drinking Or Drug Use?: No Have You Felt Bad Or Guilty About Your Drinking Or Drug Use?: Yes Have You Ever Had a Drink or Used Drugs First Thing In The Morning to Steady Your Nerves or to Get Rid of a Hangover?: Yes CAGE-AID Score: 3  Substance Abuse Education Offered: Yes  Substance abuse interventions: Transport planner

## 2020-10-23 NOTE — TOC Initial Note (Signed)
Transition of Care [facility]) - Initial/Assessment Note    Patient Details  Name: Arthur [name] MRN: [id number] Date of Birth: [birth year]  Transition of Care [facility]) CM/SW Contact:    Lawerance Sabal, RN Phone Number: 10/23/2020, 11:42 AM  Clinical Narrative:           Notified by CIR that patient is not a candidate for admission. Spoke w patient at bedside. Discussed alternatives to CIR including SNF and Home Health. Discussed openly and frankly that ETOH use and lack of insurance coverage would be significant barriers to SNF placement. Patient understood and stated that he is agreeable to either SNF or Home Health and wanted me to speak to his wife to have her decide. Spoke w patient's wife over the phone. She was very helpful and willing to assist him as much as possible once home.  Explained the above to her as well. She tells me that she has a 10 year old son with functional disabilities and she has "no problems" with Mr Verga returning home, adding that she feels he would do better at home.  I offered to make referral to [facility], who is charity provider for [facility] services through Monday (changed to [place] on Tuesday). She is appreciative. [facility] reviewing. We discussed DME needs. She states that she would like RW. She is going to get chair for the shower. Her home has 5 steps with bilateral railings to get inside. Living area, and bedroom with on suite bathroom is on first level. Family will be able to transport home.   Referral pending for charity [facility]- will need orders. DME- will need RW Medications- evaluate for MATCH at DC    PCP Chevis Pretty, MD        Expected Discharge Plan: Home w Home Health Services Barriers to Discharge: Continued Medical Work up   Patient Goals and CMS Choice Patient states their goals for this hospitalization and ongoing recovery are:: to go home CMS Medicare.gov Compare Post Acute Care list provided to:: Patient Choice offered to / list  presented to : Spouse,Patient  Expected Discharge Plan and Services Expected Discharge Plan: Home w Home Health Services In-house Referral: Clinical Social Work Discharge Planning Services: CM Consult Post Acute Care Choice: Home Health,Durable Medical Equipment Living arrangements for the past 2 months: Single Family Home                           HH Arranged: PT,OT HH Agency: [facility] Date [facility] Agency Contacted: 10/23/20 Time HH Agency Contacted: 1141 Representative spoke with at [facility] Agency: Stacie  Prior Living Arrangements/Services Living arrangements for the past 2 months: Single Family Home Lives with:: Spouse Patient language and need for interpreter reviewed:: Yes Do you feel safe going back to the place where you live?: Yes      Need for Family Participation in Patient Care: Yes (Comment) Care giver support system in place?: Yes (comment)   Criminal Activity/Legal Involvement Pertinent to Current Situation/Hospitalization: No - Comment as needed  Activities of Daily Living Home Assistive Devices/Equipment: None ADL Screening (condition at time of admission) Patient's cognitive ability adequate to safely complete daily activities?: Yes Is the patient deaf or have difficulty hearing?: No Does the patient have difficulty seeing, even when wearing glasses/contacts?: No Does the patient have difficulty concentrating, remembering, or making decisions?: No Patient able to express need for assistance with ADLs?: Yes Does the patient have difficulty dressing or bathing?: No Independently  performs ADLs?: Yes (appropriate for developmental age) Does the patient have difficulty walking or climbing stairs?: No Weakness of Legs: None Weakness of Arms/Hands: None  Permission Sought/Granted                  Emotional Assessment       Orientation: : Fluctuating Orientation (Suspected and/or reported Sundowners) (AMS) Alcohol / Substance Use: Alcohol  Use Psych Involvement: No (comment)  Admission diagnosis:  Transaminitis [R74.01] Acute encephalopathy [G93.40] Toxic metabolic encephalopathy [G92.8] Endotracheally intubated [Z97.8] Patient Active Problem List   Diagnosis Date Noted  . Pressure injury of skin 10/19/2020  . Protein-calorie malnutrition, severe 10/18/2020  . Endotracheally intubated   . Acute encephalopathy 10/16/2020  . Toxic metabolic encephalopathy 10/16/2020  . Transaminitis   . Essential hypertension 10/08/2019  . Cerebrovascular accident (CVA) (HCC) 10/02/2019   PCP:  Chevis Pretty, MD Pharmacy:   [facility] [address] [place] [address] Phone: [phone] Fax: [phone]     Social Determinants of Health (SDOH) Interventions    Readmission Risk Interventions No flowsheet data found.

## 2020-10-23 NOTE — Progress Notes (Signed)
PROGRESS NOTE    Arthur [name]  PZW:[id number] DOB: 25-Jun-1960 DOA: 10/16/2020 PCP: Dewayne Hatch, MD   Brief Narrative:  HPI on 10/16/2020 by Ms. 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  He was able to follow some commands and moved all extremities on arrival.  Code stroke underway.  Initial CTH neg.   tPA was started then patient started to have tremulous movements and blood coming from mouth (noted to have cheek laceration) concerning for seizure activity, and given ETOH hx, tPA was therefore stopped.  Suspected to have possible ETOH withdrawal seizures.  He only received 5.1 mg of tPA at 1523 on 5/22.  CTA head/ neck neg for LVO.  Given declining mental status, he was intubated for airway protection and placed on propofol for sedation.  Per his wife via phone, he drinks "a lot" per wife, including beer and liquor, but only had one beer yesterday/ 5/21, none today.    Vitals noted 102.9, BP 177/123.  Labs significant for Na 132, glucose 181, BUN 37, sCr 3.50 (previously 1.35 in 07/2020), normal WBC, plt 107, normal coags, alk phos 420, AST/ ALT 1350/ 405, t.bili 32.  COVID and remaining labs pending.  CXR neg.  Ceftriaxone and vanc to be started after cultures obtained.  Seen by Neurology given initial code stroke.  PCCM called to admit.   Interim history Patient mated with acute encephalopathy thought to initially be due to stroke and was given tPA however stopped after patient developed seizure activity.  He was intubated for airway protection and mental status decline.  Was also noted to have AKI with transaminitis.  Neurology was consulted.  He was able to be extubated on 5/26.  TRH assumed care of patient on 5/28.  CIR was recommended however patient and wife feel this  would be too expensive. Assessment & Plan   Acute metabolic encephalopathy -Possibly secondary to seizure versus infection -CT head showed no acute abnormality -MRI showed abnormal diffusion signal in the left hippocampus.  Primary differential considerations are seizure effect and subacute infarct.  Multiple chronic infarcts. -Echocardiogram EF of 45 to 50%, LV demonstrates global hypokinesis.  Mild LVH.  Grade 1 diastolic dysfunction. -Patient was noted to have fever. -Cultures have been negative, HSV PCR negative as well (patient did have lumbar puncture on 5/23) -He was initially placed on antibiotics with vancomycin and ceftriaxone on admission as well as acyclovir however these medications have been discontinued -Mental status appears to have improved.  Patient is slow to respond but is alert and oriented x3 to situation, self and place however not time  Seizure disorder -CT and MRI as above -EEG on 5/23 showed subclinical seizures -Neurology consulted and appreciated, recommendations are to continue Keppra 500 mg twice daily, Vimpat 50 mg twice daily.  Follow-up with neurology in 3 months.Continue seizure precautions, no driving for 6 months.  Acute respiratory failure with hypercarbia -Patient intubated due to mental status decline as well as airway protection.  Was extubated on 5/26. -Currently on room air  Acute kidney injury -Suspected to be due to hypovolemia -Creatinine peaked to 3.50, now down to 1.43 -Continue to monitor CMP  Thrombocytopenia -Suspect secondary to heavy alcohol use and possible drug side effects as patient was on Dilantin, Keppra, acyclovir, lites  and lipid, Protonix which I will have a potential to affect platelet count -Platelets as low as 39 however trending upward, currently up to 107 -Continue to monitor CBC  Transaminitis -Initially patient had a meld score of 24 -Right upper quadrant ultrasound showed mild hepatic steatosis -Patient was placed  on lactulose however this is now been discontinued -LFTs continue to trend downward -Continue to monitor CMP  Diabetes mellitus, type II -Hemoglobin A1c 6.8 -Continue insulin sliding scale and CBG monitoring  Fever without leukocytosis -As above, patient was initially placed on antibiotics however these were discontinued as cultures have shown no growth to date  Essential hypertension -Continue amlodipine  Acute blood loss anemia -Suspected to be secondary to post seizure and tPA administration.  Patient was also noted to have oropharyngeal bleeding which has not resolved -No further evidence of bleeding -Hemoglobin currently 9.4 -Continue to monitor CBC and transfuse if hemoglobin drops below 7  Hyponatremia -Resolved  Hypomagnesemia -Resolved with replacement, continue to monitor  Hypokalemia -Resolved with replacement, continue to monitor  Diarrhea -Patient currently afebrile with no leukocytosis -He has received lactulose as well as stool softeners during hospitalization -Diarrhea has improved without intervention  Physical deconditioning -Likely due to recent seizure activity -PT and OT had recommended CIR however family and patient feel this may be too expensive -Patient currently Medicaid pending, will discuss with case management the possibility of charity home health -Would like to see patient improve more physically and mentally prior to discharge.  Have asked that physical, occupational, speech therapy continue to work with patient more frequently  DVT Prophylaxis SCDs  Code Status: Full  Family Communication: None at bedside  Disposition Plan:  Status is: Inpatient  Remains inpatient appropriate because:Inpatient level of care appropriate due to severity of illness   Dispo: The patient is from: Home              Anticipated d/c is to: Home              Patient currently is not medically stable to d/c.   Difficult to place patient  No   Consultants PCCM Neurology Inpatient rehab  Procedures  Abdominal ultrasound Lumbar puncture  Echocardiogram EEG  Antibiotics   Anti-infectives (From admission, onward)   Start     Dose/Rate Route Frequency Ordered Stop   10/20/20 1000  acyclovir (ZOVIRAX) 570 mg in dextrose 5 % 100 mL IVPB  Status:  Discontinued        10 mg/kg  57 kg 111.4 mL/hr over 60 Minutes Intravenous Every 12 hours 10/20/20 0701 10/20/20 0824   10/18/20 1100  acyclovir (ZOVIRAX) 570 mg in dextrose 5 % 100 mL IVPB  Status:  Discontinued        10 mg/kg  57 kg 111.4 mL/hr over 60 Minutes Intravenous Every 24 hours 10/18/20 1009 10/20/20 0701   10/17/20 1100  vancomycin (VANCOREADY) IVPB 1250 mg/250 mL  Status:  Discontinued        1,250 mg 166.7 mL/hr over 90 Minutes Intravenous STAT 10/17/20 1006 10/17/20 1014   10/17/20 1100  linezolid (ZYVOX) IVPB 600 mg  Status:  Discontinued        600 mg 300 mL/hr over 60 Minutes Intravenous Every 12 hours 10/17/20 1014 10/20/20 0828   10/16/20 1715  cefTRIAXone (ROCEPHIN) 2 g in sodium chloride 0.9 % 100 mL IVPB  Status:  Discontinued        2 g 200 mL/hr over 30 Minutes Intravenous Every 12 hours 10/16/20 1708 10/20/20  6803   10/16/20 1715  vancomycin (VANCOREADY) IVPB 1500 mg/300 mL  Status:  Discontinued        1,500 mg 150 mL/hr over 120 Minutes Intravenous  Once 10/16/20 1708 10/17/20 0732   10/16/20 1710  vancomycin variable dose per unstable renal function (pharmacist dosing)  Status:  Discontinued         Does not apply See admin instructions 10/16/20 1710 10/17/20 1014   10/16/20 1700  cefTRIAXone (ROCEPHIN) 2 g in sodium chloride 0.9 % 100 mL IVPB  Status:  Discontinued        2 g 200 mL/hr over 30 Minutes Intravenous  Once 10/16/20 1655 10/16/20 1708   10/16/20 1700  vancomycin (VANCOREADY) IVPB 1000 mg/200 mL  Status:  Discontinued        1,000 mg 200 mL/hr over 60 Minutes Intravenous  Once 10/16/20 1655 10/16/20 1708   10/16/20 1700   acyclovir (ZOVIRAX) 570 mg in dextrose 5 % 100 mL IVPB  Status:  Discontinued        10 mg/kg  57.2 kg 111.4 mL/hr over 60 Minutes Intravenous  Once 10/16/20 1655 10/16/20 1707      Subjective:   [place] [address] seen and examined today.  Patient continues to feel weak.  No specific complaints this morning.  Denies chest pain or shortness of breath, abdominal pain, nausea or vomiting, dizziness or headache.   Objective:   Vitals:   10/23/20 0700 10/23/20 0733 10/23/20 0929 10/23/20 1148  BP:  (!) 130/98 (!) 134/103 108/79  Pulse:  99  100  Resp:  19    Temp:  98 F (36.7 C)  99 F (37.2 C)  TempSrc:  Oral  Oral  SpO2:  100%  100%  Weight: 60.9 kg     Height:        Intake/Output Summary (Last 24 hours) at 10/23/2020 1150 Last data filed at 10/23/2020 0933 Gross per 24 hour  Intake 290 ml  Output 700 ml  Net -410 ml   Filed Weights   10/20/20 0500 10/21/20 0500 10/23/20 0700  Weight: 57.6 kg 57.4 kg 60.9 kg   Exam  General: Well developed, chronically ill-appearing, NAD  HEENT: NCAT, mucous membranes moist.   Cardiovascular: S1 S2 auscultated, regular rate and rhythm, no murmur appreciated.  Respiratory: Clear to auscultation bilaterally with equal chest rise  Abdomen: Soft, nontender, nondistended, + bowel sounds  Extremities: warm dry without cyanosis clubbing or edema  Neuro: AAOx3 (self, place, situation) however patient does have some short-term memory deficits particularly with month and year as well as current events.  Does appear to be generally weak in the upper and lower extremities bilaterally  Psych: appropriate mood and affect  Data Reviewed: I have personally reviewed following labs and imaging studies  CBC: Recent Labs  Lab 10/16/20 1505 10/16/20 1508 10/18/20 0735 10/19/20 0522 10/20/20 0454 10/21/20 0500 10/23/20 0239  WBC 8.1   < > 8.4 7.2 7.7 10.9* 10.5  NEUTROABS 6.8  --   --   --   --   --   --   HGB 12.5*   < > 10.2* 8.4* 9.1* 9.4*  9.4*  HCT 37.1*   < > 31.0* 26.1* 29.4* 28.6* 28.3*  MCV 78.9*   < > 81.6 82.6 85.7 80.8 81.1  PLT 107*   < > 54* 39* 47* 70* 107*   < > = values in this interval not displayed.   Basic Metabolic Panel: Recent Labs  Lab 10/18/20 0735  10/18/20 2049 10/19/20 0522 10/20/20 0454 10/21/20 0500 10/22/20 0125 10/23/20 0239  NA 138  --  142 142 142 140 138  K 4.1  --  3.4* 3.6 3.3* 3.2* 3.9  CL 108  --  114* 113* 111 107 108  CO2 19*  --  23 21* _0 GLUCOSE 130*  --  120* 92 83 106* 110*  BUN 22*  --  _1 CREATININE 3.33*  --  2.48* 1.82* 1.75* 1.43* 1.43*  CALCIUM 7.8*  --  7.7* 8.0* 8.2* 7.9* 8.0*  MG 2.1 1.7 1.7  --  1.2* 2.1  --   PHOS 4.6 3.9 3.2  --  2.3* 3.9  --    GFR: Estimated Creatinine Clearance: 42.2 mL/min (A) (by C-G formula based on SCr of 1.43 mg/dL (H)). Liver Function Tests: Recent Labs  Lab 10/16/20 1505 10/17/20 0151 10/18/20 0735 10/19/20 0522 10/23/20 0239  AST 1,350* 1,035* 476* 262* 203*  ALT 405* 309* 207* 134* 109*  ALKPHOS 420* 345* 299* 218* 468*  BILITOT 3.2* 2.2* 2.3* 1.9* 1.3*  PROT 7.1 5.9* 6.1* 5.2* 6.8  ALBUMIN 2.7* 2.2* 2.2* 1.7* 2.4*   Recent Labs  Lab 10/16/20 2232  LIPASE 145*   Recent Labs  Lab 10/16/20 2232 10/18/20 0735  AMMONIA 70* 59*   Coagulation Profile: Recent Labs  Lab 10/16/20 1505 10/17/20 0151  INR 1.2 1.4*   Cardiac Enzymes: Recent Labs  Lab 10/16/20 2232  CKTOTAL 446*   BNP (last 3 results) No results for input(s): PROBNP in the last 8760 hours. HbA1C: No results for input(s): HGBA1C in the last 72 hours. CBG: Recent Labs  Lab 10/22/20 1243 10/22/20 1633 10/22/20 2150 10/23/20 0031 10/23/20 0848  GLUCAP 103* 186* 98 109* 77   Lipid Profile: No results for input(s): CHOL, HDL, LDLCALC, TRIG, CHOLHDL, LDLDIRECT in the last 72 hours. Thyroid Function Tests: No results for input(s): TSH, T4TOTAL, FREET4, T3FREE, THYROIDAB in the last 72 hours. Anemia Panel: No results for  input(s): VITAMINB12, FOLATE, FERRITIN, TIBC, IRON, RETICCTPCT in the last 72 hours. Urine analysis:    Component Value Date/Time   COLORURINE YELLOW 10/02/2019 2120   APPEARANCEUR CLEAR 10/02/2019 2120   LABSPEC 1.032 (H) 10/02/2019 2120   PHURINE 6.0 10/02/2019 2120   GLUCOSEU NEGATIVE 10/02/2019 2120   HGBUR NEGATIVE 10/02/2019 2120   BILIRUBINUR NEGATIVE 10/02/2019 2120   KETONESUR 5 (A) 10/02/2019 2120   PROTEINUR 100 (A) 10/02/2019 2120   UROBILINOGEN 1.0 10/05/2007 1713   NITRITE NEGATIVE 10/02/2019 2120   LEUKOCYTESUR NEGATIVE 10/02/2019 2120   Sepsis Labs: _2 (procalcitonin:4,lacticidven:4)  ) Recent Results (from the past 240 hour(s))  Resp Panel by RT-PCR (Flu A&B, Covid) Nasopharyngeal Swab     Status: None   Collection Time: 10/16/20  5:20 PM   Specimen: Nasopharyngeal Swab; Nasopharyngeal(NP) swabs in vial transport medium  Result Value Ref Range Status   SARS Coronavirus 2 by RT PCR NEGATIVE NEGATIVE Final    Comment: (NOTE) SARS-CoV-2 target nucleic acids are NOT DETECTED.  The SARS-CoV-2 RNA is generally detectable in upper respiratory specimens during the acute phase of infection. The lowest concentration of SARS-CoV-2 viral copies this assay can detect is 138 copies/mL. A negative result does not preclude SARS-Cov-2 infection and should not be used as the sole basis for treatment or other patient management decisions. A negative result may occur with  improper specimen collection/handling, submission of specimen other than nasopharyngeal swab, presence of viral mutation(s) within the  areas targeted by this assay, and inadequate number of viral copies(<138 copies/mL). A negative result must be combined with clinical observations, patient history, and epidemiological information. The expected result is Negative.  Fact Sheet for Patients:  EntrepreneurPulse.com.au  Fact Sheet for Healthcare Providers:   IncredibleEmployment.be  This test is no t yet approved or cleared by the [place] FDA and  has been authorized for detection and/or diagnosis of SARS-CoV-2 by FDA under an Emergency Use Authorization (EUA). This EUA will remain  in effect (meaning this test can be used) for the duration of the COVID-19 declaration under Section 564(b)(1) of the Act, 21 U.S.C.section 360bbb-3(b)(1), unless the authorization is terminated  or revoked sooner.       Influenza A by PCR NEGATIVE NEGATIVE Final   Influenza B by PCR NEGATIVE NEGATIVE Final    Comment: (NOTE) The Xpert Xpress SARS-CoV-2/FLU/RSV plus assay is intended as an aid in the diagnosis of influenza from Nasopharyngeal swab specimens and should not be used as a sole basis for treatment. Nasal washings and aspirates are unacceptable for Xpert Xpress SARS-CoV-2/FLU/RSV testing.  Fact Sheet for Patients: EntrepreneurPulse.com.au  Fact Sheet for Healthcare Providers: IncredibleEmployment.be  This test is not yet approved or cleared by the [place] FDA and has been authorized for detection and/or diagnosis of SARS-CoV-2 by FDA under an Emergency Use Authorization (EUA). This EUA will remain in effect (meaning this test can be used) for the duration of the COVID-19 declaration under Section 564(b)(1) of the Act, 21 U.S.C. section 360bbb-3(b)(1), unless the authorization is terminated or revoked.  Performed at [facility], [place] [address] [place], [place] [address]   MRSA PCR Screening     Status: None   Collection Time: 10/16/20  9:13 PM   Specimen: Nasal Mucosa; Nasopharyngeal  Result Value Ref Range Status   MRSA by PCR NEGATIVE NEGATIVE Final    Comment:        The GeneXpert MRSA Assay (FDA approved for NASAL specimens only), is one component of a comprehensive MRSA colonization surveillance program. It is not intended to diagnose MRSA infection nor  to guide or monitor treatment for MRSA infections. Performed at [facility], [place] [address] [place], [place] [address]   Culture, blood (routine x 2)     Status: None   Collection Time: 10/16/20 10:03 PM   Specimen: BLOOD  Result Value Ref Range Status   Specimen Description BLOOD SITE NOT SPECIFIED  Final   Special Requests   Final    BOTTLES DRAWN AEROBIC AND ANAEROBIC Blood Culture results may not be optimal due to an inadequate volume of blood received in culture bottles   Culture   Final    NO GROWTH 5 DAYS Performed at [facility], [place] [address], [place], [place] [address]    Report Status 10/22/2020 FINAL  Final  Culture, blood (single)     Status: None   Collection Time: 10/16/20 10:03 PM   Specimen: BLOOD  Result Value Ref Range Status   Specimen Description BLOOD SITE NOT SPECIFIED  Final   Special Requests   Final    BOTTLES DRAWN AEROBIC ONLY Blood Culture adequate volume   Culture   Final    NO GROWTH 5 DAYS Performed at [facility], [place] [address], [place], [place] [address]    Report Status 10/22/2020 FINAL  Final  CSF culture w Stat Gram Stain     Status: None   Collection Time: 10/17/20  4:01 PM   Specimen: CSF; Cerebrospinal Fluid  Result Value Ref Range Status   Specimen Description CSF  Final   Special Requests NONE  Final   Gram Stain   Final    WBC PRESENT,BOTH PMN AND MONONUCLEAR NO ORGANISMS SEEN CYTOSPIN SMEAR    Culture   Final    NO GROWTH Performed at [facility], [address] [place], [place] [address]    Report Status 10/21/2020 FINAL  Final      Radiology Studies: No results found.   Scheduled Meds: . amLODipine  10 mg Oral Daily  . Chlorhexidine Gluconate Cloth  6 each Topical Q0600  . cloNIDine  0.1 mg Oral BID  . feeding supplement  237 mL Oral TID BM  . folic acid  1 mg Oral Daily  . insulin aspart  0-9 Units Subcutaneous Q4H  . lacosamide  50 mg Oral BID  . levETIRAcetam  500 mg Oral BID   . mouth rinse  15 mL Mouth Rinse BID  . multivitamin with minerals  1 tablet Oral Daily  . sodium chloride flush  10-40 mL Intracatheter Q12H  . thiamine  100 mg Oral Daily   Or  . thiamine  100 mg Intravenous Daily   Continuous Infusions: . lactated ringers 10 mL/hr at 10/22/20 1500     LOS: 7 days   Time Spent in minutes   45 minutes  Oneil Behney D.O. on 10/23/2020 at 11:50 AM  Between 7am to 7pm - Please see pager noted on [email or web address]  After 7pm go to [email or web address]  And look for the night coverage person covering for me after hours  [facility] Office  [phone]

## 2020-10-24 ENCOUNTER — Encounter: Payer: Self-pay | Admitting: *Deleted

## 2020-10-24 DIAGNOSIS — E43 Unspecified severe protein-calorie malnutrition: Secondary | ICD-10-CM

## 2020-10-24 LAB — COMPREHENSIVE METABOLIC PANEL
ALT: 105 U/L — ABNORMAL HIGH (ref 0–44)
AST: 176 U/L — ABNORMAL HIGH (ref 15–41)
Albumin: 2.3 g/dL — ABNORMAL LOW (ref 3.5–5.0)
Alkaline Phosphatase: 419 U/L — ABNORMAL HIGH (ref 38–126)
Anion gap: 6 (ref 5–15)
BUN: 16 mg/dL (ref 6–20)
CO2: 23 mmol/L (ref 22–32)
Calcium: 8.2 mg/dL — ABNORMAL LOW (ref 8.9–10.3)
Chloride: 108 mmol/L (ref 98–111)
Creatinine, Ser: 1.42 mg/dL — ABNORMAL HIGH (ref 0.61–1.24)
GFR, Estimated: 58 mL/min — ABNORMAL LOW (ref >60)
Glucose, Bld: 95 mg/dL (ref 70–99)
Potassium: 3.7 mmol/L (ref 3.5–5.1)
Sodium: 137 mmol/L (ref 135–145)
Total Bilirubin: 0.8 mg/dL (ref 0.3–1.2)
Total Protein: 6 g/dL — ABNORMAL LOW (ref 6.5–8.1)

## 2020-10-24 LAB — CBC
HCT: 24.4 % — ABNORMAL LOW (ref 39.0–52.0)
Hemoglobin: 8 g/dL — ABNORMAL LOW (ref 13.0–17.0)
MCH: 26.8 pg (ref 26.0–34.0)
MCHC: 32.8 g/dL (ref 30.0–36.0)
MCV: 81.9 fL (ref 80.0–100.0)
Platelets: 143 10*3/uL — ABNORMAL LOW (ref 150–400)
RBC: 2.98 MIL/uL — ABNORMAL LOW (ref 4.22–5.81)
RDW: 14.9 % (ref 11.5–15.5)
WBC: 9.1 10*3/uL (ref 4.0–10.5)
nRBC: 0.7 % — ABNORMAL HIGH (ref 0.0–0.2)

## 2020-10-24 LAB — GLUCOSE, CAPILLARY
Glucose-Capillary: 93 mg/dL (ref 70–99)
Glucose-Capillary: 90 mg/dL (ref 70–99)
Glucose-Capillary: 117 mg/dL — ABNORMAL HIGH (ref 70–99)
Glucose-Capillary: 125 mg/dL — ABNORMAL HIGH (ref 70–99)

## 2020-10-24 NOTE — Progress Notes (Signed)
PROGRESS NOTE    Arthur [name]  UEA:[id number] DOB: [birth year] DOA: 10/16/2020 PCP: Dewayne Hatch, MD   Brief Narrative:  HPI on 10/16/2020 by Ms. 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  He was able to follow some commands and moved all extremities on arrival.  Code stroke underway.  Initial CTH neg.   tPA was started then patient started to have tremulous movements and blood coming from mouth (noted to have cheek laceration) concerning for seizure activity, and given ETOH hx, tPA was therefore stopped.  Suspected to have possible ETOH withdrawal seizures.  He only received 5.1 mg of tPA at 1523 on 5/22.  CTA head/ neck neg for LVO.  Given declining mental status, he was intubated for airway protection and placed on propofol for sedation.  Per his wife via phone, he drinks "a lot" per wife, including beer and liquor, but only had one beer yesterday/ 5/21, none today.    Vitals noted 102.9, BP 177/123.  Labs significant for Na 132, glucose 181, BUN 37, sCr 3.50 (previously 1.35 in 07/2020), normal WBC, plt 107, normal coags, alk phos 420, AST/ ALT 1350/ 405, t.bili 32.  COVID and remaining labs pending.  CXR neg.  Ceftriaxone and vanc to be started after cultures obtained.  Seen by Neurology given initial code stroke.  PCCM called to admit.   Interim history Patient mated with acute encephalopathy thought to initially be due to stroke and was given tPA however stopped after patient developed seizure activity.  He was intubated for airway protection and mental status decline.  Was also noted to have AKI with transaminitis.  Neurology was consulted.  He was able to be extubated on 5/26.  TRH assumed care of patient on 5/28.  CIR was recommended however patient and wife feel this  would be too expensive. Assessment & Plan   Acute metabolic encephalopathy -Possibly secondary to seizure versus infection -CT head showed no acute abnormality -MRI showed abnormal diffusion signal in the left hippocampus.  Primary differential considerations are seizure effect and subacute infarct.  Multiple chronic infarcts. -Echocardiogram EF of 45 to 50%, LV demonstrates global hypokinesis.  Mild LVH.  Grade 1 diastolic dysfunction. -Patient was noted to have fever. -Cultures have been negative, HSV PCR negative as well (patient did have lumbar puncture on 5/23) -He was initially placed on antibiotics with vancomycin and ceftriaxone on admission as well as acyclovir however these medications have been discontinued -Mental status appears to have improved.  Patient is slow to respond but is alert and oriented x4 to situation, self and place however it takes him a while to respond to year and month  Seizure disorder -CT and MRI as above -EEG on 5/23 showed subclinical seizures -Neurology consulted and appreciated, recommendations are to continue Keppra 500 mg twice daily, Vimpat 50 mg twice daily.  Follow-up with neurology in 3 months.Continue seizure precautions, no driving for 6 months.  Acute respiratory failure with hypercarbia -Patient intubated due to mental status decline as well as airway protection.  Was extubated on 5/26. -Currently on room air  Acute kidney injury -Suspected to be due to hypovolemia -Creatinine peaked to 3.50, now down to 1.42 -Continue to monitor CMP  Thrombocytopenia -Suspect secondary to heavy alcohol use and possible drug side  effects as patient was on Dilantin, Keppra, acyclovir, lites and lipid, Protonix which I will have a potential to affect platelet count -Platelets as low as 39 however trending upward, currently up to 143 -Continue to monitor CBC  Transaminitis -Initially patient had a meld score of 24 -Right upper quadrant ultrasound showed  mild hepatic steatosis -Patient was placed on lactulose however this is now been discontinued -LFTs continue to trend downward -Continue to monitor CMP  Diabetes mellitus, type II -Hemoglobin A1c 6.8 -Continue insulin sliding scale and CBG monitoring  Fever without leukocytosis -As above, patient was initially placed on antibiotics however these were discontinued as cultures have shown no growth to date  Essential hypertension -Continue amlodipine  Acute blood loss anemia -Suspected to be secondary to post seizure and tPA administration.  Patient was also noted to have oropharyngeal bleeding which has not resolved -No further evidence of bleeding -Hemoglobin currently 8 -Continue to monitor CBC and transfuse if hemoglobin drops below 7  Hyponatremia -Resolved  Hypomagnesemia -Resolved with replacement, continue to monitor  Hypokalemia -Resolved with replacement, continue to monitor  Diarrhea -Patient currently afebrile with no leukocytosis -He has received lactulose as well as stool softeners during hospitalization -Diarrhea has improved without intervention  Physical deconditioning -Likely due to recent seizure activity -PT and OT had recommended CIR however family and patient feel this may be too expensive -Patient currently Medicaid pending, will discuss with case management the possibility of charity home health vs outpatient therapy- will also need medication assistance -Would like to see patient improve more physically and mentally prior to discharge.  Have asked that physical, occupational, speech therapy continue to work with patient more frequently   DVT Prophylaxis SCDs  Code Status: Full  Family Communication: None at bedside  Disposition Plan:  Status is: Inpatient  Remains inpatient appropriate because:Inpatient level of care appropriate due to severity of illness   Dispo: The patient is from: Home              Anticipated d/c is to: Home               Patient currently is not medically stable to d/c.   Difficult to place patient No   Consultants PCCM Neurology Inpatient rehab  Procedures  Abdominal ultrasound Lumbar puncture  Echocardiogram EEG  Antibiotics   Anti-infectives (From admission, onward)   Start     Dose/Rate Route Frequency Ordered Stop   10/20/20 1000  acyclovir (ZOVIRAX) 570 mg in dextrose 5 % 100 mL IVPB  Status:  Discontinued        10 mg/kg  57 kg 111.4 mL/hr over 60 Minutes Intravenous Every 12 hours 10/20/20 0701 10/20/20 0824   10/18/20 1100  acyclovir (ZOVIRAX) 570 mg in dextrose 5 % 100 mL IVPB  Status:  Discontinued        10 mg/kg  57 kg 111.4 mL/hr over 60 Minutes Intravenous Every 24 hours 10/18/20 1009 10/20/20 0701   10/17/20 1100  vancomycin (VANCOREADY) IVPB 1250 mg/250 mL  Status:  Discontinued        1,250 mg 166.7 mL/hr over 90 Minutes Intravenous STAT 10/17/20 1006 10/17/20 1014   10/17/20 1100  linezolid (ZYVOX) IVPB 600 mg  Status:  Discontinued        600 mg 300 mL/hr over 60 Minutes Intravenous Every 12 hours 10/17/20 1014 10/20/20 0828   10/16/20 1715  cefTRIAXone (ROCEPHIN) 2 g in sodium chloride 0.9 % 100 mL IVPB  Status:  Discontinued  2 g 200 mL/hr over 30 Minutes Intravenous Every 12 hours 10/16/20 1708 10/20/20 0828   10/16/20 1715  vancomycin (VANCOREADY) IVPB 1500 mg/300 mL  Status:  Discontinued        1,500 mg 150 mL/hr over 120 Minutes Intravenous  Once 10/16/20 1708 10/17/20 0732   10/16/20 1710  vancomycin variable dose per unstable renal function (pharmacist dosing)  Status:  Discontinued         Does not apply See admin instructions 10/16/20 1710 10/17/20 1014   10/16/20 1700  cefTRIAXone (ROCEPHIN) 2 g in sodium chloride 0.9 % 100 mL IVPB  Status:  Discontinued        2 g 200 mL/hr over 30 Minutes Intravenous  Once 10/16/20 1655 10/16/20 1708   10/16/20 1700  vancomycin (VANCOREADY) IVPB 1000 mg/200 mL  Status:  Discontinued        1,000 mg 200 mL/hr over  60 Minutes Intravenous  Once 10/16/20 1655 10/16/20 1708   10/16/20 1700  acyclovir (ZOVIRAX) 570 mg in dextrose 5 % 100 mL IVPB  Status:  Discontinued        10 mg/kg  57.2 kg 111.4 mL/hr over 60 Minutes Intravenous  Once 10/16/20 1655 10/16/20 1707      Subjective:   [place] [address] seen and examined today.  Patient continues to feel weak.  No specific complaints this morning.  Denies chest pain or shortness of breath, abdominal pain, nausea or vomiting, dizziness or headache.   Objective:   Vitals:   10/24/20 0323 10/24/20 0735 10/24/20 0946 10/24/20 1148  BP: 123/89 134/87 124/80   Pulse: 93 (!) 101    Resp: 18 18    Temp: 98.9 F (37.2 C) 98.4 F (36.9 C)  98 F (36.7 C)  TempSrc: Oral Oral    SpO2: 100% 100%    Weight: 63.1 kg     Height:        Intake/Output Summary (Last 24 hours) at 10/24/2020 1148 Last data filed at 10/24/2020 1148 Gross per 24 hour  Intake 360 ml  Output 1400 ml  Net -1040 ml   Filed Weights   10/21/20 0500 10/23/20 0700 10/24/20 0323  Weight: 57.4 kg 60.9 kg 63.1 kg   Exam  General: Well developed, chronically ill-appearing, NAD  HEENT: NCAT,  mucous membranes moist.   Cardiovascular: S1 S2 auscultated, RRR  Respiratory: Clear to auscultation bilaterally  Abdomen: Soft, nontender, nondistended, + bowel sounds  Extremities: warm dry without cyanosis clubbing or edema  Neuro: AAOx3, slow to respond but answers appropriately.  Generally weak in the upper and lower extremities  Psych: Appropriate mood and affect   Data Reviewed: I have personally reviewed following labs and imaging studies  CBC: Recent Labs  Lab 10/19/20 0522 10/20/20 0454 10/21/20 0500 10/23/20 0239 10/24/20 0103  WBC 7.2 7.7 10.9* 10.5 9.1  HGB 8.4* 9.1* 9.4* 9.4* 8.0*  HCT 26.1* 29.4* 28.6* 28.3* 24.4*  MCV 82.6 85.7 80.8 81.1 81.9  PLT 39* 47* 70* 107* 381*   Basic Metabolic Panel: Recent Labs  Lab 10/18/20 0735 10/18/20 2049 10/19/20 0522  10/20/20 0454 10/21/20 0500 10/22/20 0125 10/23/20 0239 10/24/20 0103  NA 138  --  142 142 142 140 138 137  K 4.1  --  3.4* 3.6 3.3* 3.2* 3.9 3.7  CL 108  --  114* 113* 111 107 108 108  CO2 19*  --  23 21* $Remo'23 25 22 23  'albxl$ GLUCOSE 130*  --  120* 92 83 106* 110*  95  BUN 22*  --  $R'14 10 9 8 15 16  'OO$ CREATININE 3.33*  --  2.48* 1.82* 1.75* 1.43* 1.43* 1.42*  CALCIUM 7.8*  --  7.7* 8.0* 8.2* 7.9* 8.0* 8.2*  MG 2.1 1.7 1.7  --  1.2* 2.1  --   --   PHOS 4.6 3.9 3.2  --  2.3* 3.9  --   --    GFR: Estimated Creatinine Clearance: 45.9 mL/min (A) (by C-G formula based on SCr of 1.42 mg/dL (H)). Liver Function Tests: Recent Labs  Lab 10/18/20 0735 10/19/20 0522 10/23/20 0239 10/24/20 0103  AST 476* 262* 203* 176*  ALT 207* 134* 109* 105*  ALKPHOS 299* 218* 468* 419*  BILITOT 2.3* 1.9* 1.3* 0.8  PROT 6.1* 5.2* 6.8 6.0*  ALBUMIN 2.2* 1.7* 2.4* 2.3*   No results for input(s): LIPASE, AMYLASE in the last 168 hours. Recent Labs  Lab 10/18/20 0735  AMMONIA 59*   Coagulation Profile: No results for input(s): INR, PROTIME in the last 168 hours. Cardiac Enzymes: No results for input(s): CKTOTAL, CKMB, CKMBINDEX, TROPONINI in the last 168 hours. BNP (last 3 results) No results for input(s): PROBNP in the last 8760 hours. HbA1C: No results for input(s): HGBA1C in the last 72 hours. CBG: Recent Labs  Lab 10/23/20 0848 10/23/20 1151 10/23/20 1624 10/23/20 2113 10/24/20 0605  GLUCAP 77 124* 79 122* 93   Lipid Profile: No results for input(s): CHOL, HDL, LDLCALC, TRIG, CHOLHDL, LDLDIRECT in the last 72 hours. Thyroid Function Tests: No results for input(s): TSH, T4TOTAL, FREET4, T3FREE, THYROIDAB in the last 72 hours. Anemia Panel: No results for input(s): VITAMINB12, FOLATE, FERRITIN, TIBC, IRON, RETICCTPCT in the last 72 hours. Urine analysis:    Component Value Date/Time   COLORURINE YELLOW 10/02/2019 2120   APPEARANCEUR CLEAR 10/02/2019 2120   LABSPEC 1.032 (H) 10/02/2019 2120    PHURINE 6.0 10/02/2019 2120   GLUCOSEU NEGATIVE 10/02/2019 2120   HGBUR NEGATIVE 10/02/2019 2120   BILIRUBINUR NEGATIVE 10/02/2019 2120   KETONESUR 5 (A) 10/02/2019 2120   PROTEINUR 100 (A) 10/02/2019 2120   UROBILINOGEN 1.0 10/05/2007 1713   NITRITE NEGATIVE 10/02/2019 2120   LEUKOCYTESUR NEGATIVE 10/02/2019 2120   Sepsis Labs: $RemoveBefo'@LABRCNTIP'UizOIzveNCn$ (procalcitonin:4,lacticidven:4)  ) Recent Results (from the past 240 hour(s))  Resp Panel by RT-PCR (Flu A&B, Covid) Nasopharyngeal Swab     Status: None   Collection Time: 10/16/20  5:20 PM   Specimen: Nasopharyngeal Swab; Nasopharyngeal(NP) swabs in vial transport medium  Result Value Ref Range Status   SARS Coronavirus 2 by RT PCR NEGATIVE NEGATIVE Final    Comment: (NOTE) SARS-CoV-2 target nucleic acids are NOT DETECTED.  The SARS-CoV-2 RNA is generally detectable in upper respiratory specimens during the acute phase of infection. The lowest concentration of SARS-CoV-2 viral copies this assay can detect is 138 copies/mL. A negative result does not preclude SARS-Cov-2 infection and should not be used as the sole basis for treatment or other patient management decisions. A negative result may occur with  improper specimen collection/handling, submission of specimen other than nasopharyngeal swab, presence of viral mutation(s) within the areas targeted by this assay, and inadequate number of viral copies(<138 copies/mL). A negative result must be combined with clinical observations, patient history, and epidemiological information. The expected result is Negative.  Fact Sheet for Patients:  EntrepreneurPulse.com.au  Fact Sheet for Healthcare Providers:  IncredibleEmployment.be  This test is no t yet approved or cleared by the [place] FDA and  has been authorized for detection and/or  diagnosis of SARS-CoV-2 by FDA under an Emergency Use Authorization (EUA). This EUA will remain  in effect  (meaning this test can be used) for the duration of the COVID-19 declaration under Section 564(b)(1) of the Act, 21 U.S.C.section 360bbb-3(b)(1), unless the authorization is terminated  or revoked sooner.       Influenza A by PCR NEGATIVE NEGATIVE Final   Influenza B by PCR NEGATIVE NEGATIVE Final    Comment: (NOTE) The Xpert Xpress SARS-CoV-2/FLU/RSV plus assay is intended as an aid in the diagnosis of influenza from Nasopharyngeal swab specimens and should not be used as a sole basis for treatment. Nasal washings and aspirates are unacceptable for Xpert Xpress SARS-CoV-2/FLU/RSV testing.  Fact Sheet for Patients: EntrepreneurPulse.com.au  Fact Sheet for Healthcare Providers: IncredibleEmployment.be  This test is not yet approved or cleared by the [place] FDA and has been authorized for detection and/or diagnosis of SARS-CoV-2 by FDA under an Emergency Use Authorization (EUA). This EUA will remain in effect (meaning this test can be used) for the duration of the COVID-19 declaration under Section 564(b)(1) of the Act, 21 U.S.C. section 360bbb-3(b)(1), unless the authorization is terminated or revoked.  Performed at [facility], [place] [address], [place], [place] [address]   MRSA PCR Screening     Status: None   Collection Time: 10/16/20  9:13 PM   Specimen: Nasal Mucosa; Nasopharyngeal  Result Value Ref Range Status   MRSA by PCR NEGATIVE NEGATIVE Final    Comment:        The GeneXpert MRSA Assay (FDA approved for NASAL specimens only), is one component of a comprehensive MRSA colonization surveillance program. It is not intended to diagnose MRSA infection nor to guide or monitor treatment for MRSA infections. Performed at [facility], [place] [address], [place], [place] [address]   Culture, blood (routine x 2)     Status: None   Collection Time: 10/16/20 10:03 PM   Specimen: BLOOD  Result Value Ref Range Status    Specimen Description BLOOD SITE NOT SPECIFIED  Final   Special Requests   Final    BOTTLES DRAWN AEROBIC AND ANAEROBIC Blood Culture results may not be optimal due to an inadequate volume of blood received in culture bottles   Culture   Final    NO GROWTH 5 DAYS Performed at [facility], [place] [address], [place], [place] [address]    Report Status 10/22/2020 FINAL  Final  Culture, blood (single)     Status: None   Collection Time: 10/16/20 10:03 PM   Specimen: BLOOD  Result Value Ref Range Status   Specimen Description BLOOD SITE NOT SPECIFIED  Final   Special Requests   Final    BOTTLES DRAWN AEROBIC ONLY Blood Culture adequate volume   Culture   Final    NO GROWTH 5 DAYS Performed at [facility], [place] [address] [place],  [address]    Report Status 10/22/2020 FINAL  Final  CSF culture w Stat Gram Stain     Status: None   Collection Time: 10/17/20  4:01 PM   Specimen: CSF; Cerebrospinal Fluid  Result Value Ref Range Status   Specimen Description CSF  Final   Special Requests NONE  Final   Gram Stain   Final    WBC PRESENT,BOTH PMN AND MONONUCLEAR NO ORGANISMS SEEN CYTOSPIN SMEAR    Culture   Final    NO GROWTH Performed at [facility], [place]  [address] [place], [place] [address]    Report Status 10/21/2020 FINAL  Final      Radiology Studies: No results found.   Scheduled Meds: . amLODipine  10 mg Oral Daily  . Chlorhexidine Gluconate Cloth  6 each Topical Q0600  . cloNIDine  0.1 mg Oral BID  . feeding supplement  237 mL Oral TID BM  . folic acid  1 mg Oral Daily  . insulin aspart  0-5 Units Subcutaneous QHS  . insulin aspart  0-9 Units Subcutaneous TID WC  . lacosamide  50 mg Oral BID  . levETIRAcetam  500 mg Oral BID  . mouth rinse  15 mL Mouth Rinse BID  . multivitamin with minerals  1 tablet Oral Daily  . sodium chloride flush  10-40 mL Intracatheter Q12H  . thiamine  100 mg Oral Daily   Or  . thiamine  100 mg Intravenous  Daily   Continuous Infusions: . lactated ringers 10 mL/hr at 10/23/20 1248     LOS: 8 days   Time Spent in minutes   45 minutes  Raegen Tarpley D.O. on 10/24/2020 at 11:48 AM  Between 7am to 7pm - Please see pager noted on [email or web address]  After 7pm go to [email or web address]  And look for the night coverage person covering for me after hours  [facility] Office  [phone]

## 2020-10-24 NOTE — TOC Transition Note (Signed)
Transition of Care [facility]) - CM/SW Discharge Note   Patient Details  Name: Arthur [name] MRN: [id number] Date of Birth: 1961-01-20  Transition of Care [facility]) CM/SW Contact:  Kermit Balo, RN Phone Number: 10/24/2020, 9:34 AM   Clinical Narrative:    [facility] unable to accept for charity [facility] services d/t staffing. CM has spoken to patient and his spouse and they are in agreement with outpatient therapy. Cm updated MD and information is on the AVS.  Walker for home to be delivered to the room per [facility]. (charity services) Pt has transport home.   Final next level of care: OP Rehab Barriers to Discharge: Inadequate or no insurance,Barriers Unresolved (comment)   Patient Goals and CMS Choice Patient states their goals for this hospitalization and ongoing recovery are:: to go home CMS Medicare.gov Compare Post Acute Care list provided to:: Patient Choice offered to / list presented to : [facility]  Discharge Placement                       Discharge Plan and Services In-house Referral: Clinical Social Work Discharge Planning Services: CM Consult Post Acute Care Choice: Home Health,Durable Medical Equipment                    HH Arranged: PT,OT HH Agency: [facility] Date [facility] Agency Contacted: 10/23/20 Time HH Agency Contacted: 1141 Representative spoke with at [facility] Agency: Stacie  Social Determinants of Health (SDOH) Interventions     Readmission Risk Interventions No flowsheet data found.

## 2020-10-24 NOTE — Progress Notes (Signed)
  Speech Language Pathology Treatment: Cognitive-Linquistic  Patient Details Name: Arthur [name] MRN: [id number] DOB: Jul 07, 1960 Today's Date: 10/24/2020 Time: 4462-8638 SLP Time Calculation (min) (ACUTE ONLY): 25 min  Assessment / Plan / Recommendation Clinical Impression  Pt seen for aphasia tx which consisted of repetitive naming at word level with various mod verbal cues and 50% accuracy given phonemic/semantic cues and compensatory strategies taught for sentence completion, visualization, gesturing, graphic expression when he has difficulty making his wants/needs known.  Deductive naming tasks with mod verbal cues with an overall accuracy obtained of 60%; confrontational naming task with mod verbal cues and before mentioned strategies with an accuracy of 59% obtained.  Pt able to name automatic information with min cues and 89% accuracy (ie: days of week, counting, etc.); [name] was distracted by the television during the session, so min verbal cues to stay on task provided with success. Improvement noted as session progressed with naming and simple conversation in general.  Pt appreciative for ST information/education provided and eager to continue at home with [facility] SLP when d/c to regain ability to communicate more effectively. ST will continue to provide services within acute setting for aphasia/cognition.   HPI HPI: 60 yo chronically ill man with EtOH abuse, HTN, L MCA CVA. He developed garbled speech and AMS at home 5/22, presented to ED on 10/16/20 with this as well as transaminitis, acute renal failure. Significant concern for acute CVA, initial head CT negative. Started bolus of t-PA but this was immediately stopped because he developed acute seizure. There was an apparent tongue or cheek laceration with OP bleeding. He required intubation for airway protection following ativan and post-ictal state. No reported infectious prodrome described by his wife by phone. She did indicate that he drank  only 1 beer on 5/21. He developed fever in the ED. CXR clear. Empiric abx for possible meningitis initiated. cEEG being started.      SLP Plan  Continue with current plan of care       Recommendations      Home health SLP once d/c from acute setting             Follow up Recommendations: Home health SLP SLP Visit Diagnosis: Aphasia (R47.01) Plan: Continue with current plan of care                       Tressie Stalker, M.S., CCC-SLP 10/24/2020, 12:14 PM

## 2020-10-24 NOTE — Progress Notes (Signed)
Physical Therapy Treatment Patient Details Name: Arthur [name] MRN: [id number] DOB: [birth year] Today's Date: 10/24/2020    History of Present Illness 60 yo chronically ill man admitted 10/16/20 presented with garbled speech and AMS, transaminitis, acute renal failure. During TPA administration developed  Acute seizure requiring intubation. Dx with acute encephalopathy.  PMH includes ETOH abuse and Hypertension.    PT Comments    Patient progressing towards physical therapy goals. Patient ambulating 200' with RW and minA for balance and RW management. Educated patient on need for supervision for OOB mobility for safety, patient verbalized understanding. Updated recommendation to OPPT and 24 hour supervision/assistance at discharge.     Follow Up Recommendations  Outpatient PT;Supervision for mobility/OOB     Equipment Recommendations  Rolling Shakila Mak with 5" wheels    Recommendations for Other Services       Precautions / Restrictions Precautions Precautions: Fall Restrictions Weight Bearing Restrictions: No    Mobility  Bed Mobility Overal bed mobility: Needs Assistance Bed Mobility: Supine to Sit;Sit to Supine     Supine to sit: Supervision Sit to supine: Supervision   General bed mobility comments: supervision for safety    Transfers Overall transfer level: Needs assistance Equipment used: Rolling Sherlon Nied (2 wheeled) Transfers: Sit to/from Stand Sit to Stand: Min guard         General transfer comment: min guard for safety, no physical assistance required  Ambulation/Gait Ambulation/Gait assistance: Min assist Gait Distance (Feet): 200 Feet Assistive device: Rolling Jovan Colligan (2 wheeled) Gait Pattern/deviations: Step-through pattern;Decreased stride length;Trunk flexed;Wide base of support Gait velocity: decr   General Gait Details: minA for balance and assist with RW management   Stairs Stairs: Yes Stairs assistance: Min guard Stair Management: Two  rails;Step to pattern;Forwards Number of Stairs: 2 General stair comments: min guard for safety   Wheelchair Mobility    Modified Rankin (Stroke Patients Only) Modified Rankin (Stroke Patients Only) Pre-Morbid Rankin Score: No significant disability Modified Rankin: Moderately severe disability     Balance Overall balance assessment: Mild deficits observed, not formally tested                                          Cognition Arousal/Alertness: Awake/alert Behavior During Therapy: WFL for tasks assessed/performed Overall Cognitive Status: Impaired/Different from baseline Area of Impairment: Attention;Following commands;Safety/judgement;Awareness;Problem solving                   Current Attention Level: Sustained   Following Commands: Follows one step commands inconsistently;Follows one step commands with increased time Safety/Judgement: Decreased awareness of deficits;Decreased awareness of safety Awareness: Emergent Problem Solving: Slow processing;Difficulty sequencing;Requires verbal cues General Comments: increased time to process commands. Difficulty processing maneuvering around obstacles and wayfinding back to room.      Exercises General Exercises - Lower Extremity Long Arc Quad: Both;10 reps;Seated Hip Flexion/Marching: 10 reps;Standing    General Comments        Pertinent Vitals/Pain Pain Assessment: No/denies pain    Home Living                      Prior Function            PT Goals (current goals can now be found in the care plan section) Acute Rehab PT Goals Patient Stated Goal: to go home PT Goal Formulation: With patient Time For Goal Achievement: 11/04/20 Potential to Achieve  Goals: Good Progress towards PT goals: Progressing toward goals    Frequency    Min 3X/week      PT Plan Current plan remains appropriate    Co-evaluation              AM-PAC PT "6 Clicks" Mobility   Outcome  Measure  Help needed turning from your back to your side while in a flat bed without using bedrails?: None Help needed moving from lying on your back to sitting on the side of a flat bed without using bedrails?: A Little Help needed moving to and from a bed to a chair (including a wheelchair)?: A Little Help needed standing up from a chair using your arms (e.g., wheelchair or bedside chair)?: A Little Help needed to walk in hospital room?: A Little Help needed climbing 3-5 steps with a railing? : A Little 6 Click Score: 19    End of Session Equipment Utilized During Treatment: Gait belt Activity Tolerance: Patient tolerated treatment well Patient left: in bed;with call bell/phone within reach;with bed alarm set Nurse Communication: Mobility status PT Visit Diagnosis: Unsteadiness on feet (R26.81);Other abnormalities of gait and mobility (R26.89);Muscle weakness (generalized) (M62.81)     Time: 3244-0102 PT Time Calculation (min) (ACUTE ONLY): 23 min  Charges:  $Gait Training: 8-22 mins $Therapeutic Activity: 8-22 mins                     Ezme Duch A. Dan Humphreys PT, DPT Acute Rehabilitation Services Pager [phone] Office [phone]    Viviann Spare 10/24/2020, 5:01 PM

## 2020-10-24 NOTE — Plan of Care (Signed)
  Problem: Activity: Goal: Ability to tolerate increased activity will improve Outcome: Progressing   Problem: Respiratory: Goal: Ability to maintain a clear airway and adequate ventilation will improve Outcome: Progressing   Problem: Role Relationship: Goal: Method of communication will improve Outcome: Progressing   Problem: Education: Goal: Knowledge of General Education information will improve Description: Including pain rating scale, medication(s)/side effects and non-pharmacologic comfort measures Outcome: Progressing   Problem: Health Behavior/Discharge Planning: Goal: Ability to manage health-related needs will improve Outcome: Progressing   Problem: Clinical Measurements: Goal: Ability to maintain clinical measurements within normal limits will improve Outcome: Progressing Goal: Will remain free from infection Outcome: Progressing Goal: Diagnostic test results will improve Outcome: Progressing Goal: Respiratory complications will improve Outcome: Progressing Goal: Cardiovascular complication will be avoided Outcome: Progressing   Problem: Activity: Goal: Risk for activity intolerance will decrease Outcome: Progressing   Problem: Coping: Goal: Level of anxiety will decrease Outcome: Progressing   Problem: Elimination: Goal: Will not experience complications related to bowel motility Outcome: Progressing Goal: Will not experience complications related to urinary retention Outcome: Progressing   Problem: Pain Managment: Goal: General experience of comfort will improve Outcome: Progressing   Problem: Safety: Goal: Ability to remain free from injury will improve Outcome: Progressing   Problem: Skin Integrity: Goal: Risk for impaired skin integrity will decrease Outcome: Progressing   Problem: Education: Goal: Knowledge of disease or condition will improve Outcome: Progressing   Problem: Education: Goal: Knowledge of disease or condition will  improve Outcome: Progressing Goal: Knowledge of secondary prevention will improve Outcome: Progressing Goal: Knowledge of patient specific risk factors addressed and post discharge goals established will improve Outcome: Progressing   Problem: Coping: Goal: Will verbalize positive feelings about self Outcome: Progressing Goal: Will identify appropriate support needs Outcome: Progressing

## 2020-10-25 ENCOUNTER — Telehealth: Payer: Self-pay

## 2020-10-25 ENCOUNTER — Other Ambulatory Visit (HOSPITAL_COMMUNITY): Payer: Self-pay

## 2020-10-25 LAB — GLUCOSE, CAPILLARY: Glucose-Capillary: 87 mg/dL (ref 70–99)

## 2020-10-25 MED ORDER — ENSURE ENLIVE PO LIQD: 1.0000 | Freq: Three times a day (TID) | ORAL | 0 refills | Status: AC

## 2020-10-25 MED ORDER — CLONIDINE HCL 0.1 MG PO TABS
0.1000 mg | ORAL_TABLET | Freq: Two times a day (BID) | ORAL | 2 refills | Status: DC
  Filled 2020-10-25: qty 60, 30d supply, fill #0

## 2020-10-25 MED ORDER — METFORMIN HCL 500 MG PO TABS
500.0000 mg | ORAL_TABLET | Freq: Two times a day (BID) | ORAL | 1 refills | Status: DC
Start: 2020-10-25 — End: 2020-11-16
  Filled 2020-10-25: qty 60, 30d supply, fill #0

## 2020-10-25 MED ORDER — POLYETHYLENE GLYCOL 3350 17 G PO PACK
17.0000 g | PACK | Freq: Every day | ORAL | 0 refills | Status: DC | PRN
Start: 2020-10-25 — End: 2020-11-22

## 2020-10-25 MED ORDER — ADULT MULTIVITAMIN W/MINERALS CH: 1.0000 | ORAL_TABLET | Freq: Every day | ORAL | Status: DC

## 2020-10-25 MED ORDER — AMLODIPINE BESYLATE 10 MG PO TABS
10.0000 mg | ORAL_TABLET | Freq: Every day | ORAL | 1 refills | Status: DC
  Filled 2020-10-25: qty 30, 30d supply, fill #0

## 2020-10-25 MED ORDER — LACOSAMIDE 50 MG PO TABS
50.0000 mg | ORAL_TABLET | Freq: Two times a day (BID) | ORAL | 0 refills | Status: DC
  Filled 2020-10-25 (×2): qty 60, 30d supply, fill #0

## 2020-10-25 MED ORDER — LEVETIRACETAM 500 MG PO TABS
500.0000 mg | ORAL_TABLET | Freq: Two times a day (BID) | ORAL | 2 refills | Status: DC
  Filled 2020-10-25: qty 60, 30d supply, fill #0
  Filled 2020-12-19: qty 60, 30d supply, fill #1

## 2020-10-25 NOTE — Plan of Care (Signed)
Min assist with adls, safety goals met

## 2020-10-25 NOTE — TOC Transition Note (Addendum)
Transition of Care [facility]) - CM/SW Discharge Note   Patient Details  Name: HALLEY KINCER MRN: [id number] Date of Birth: September 18, 1960  Transition of Care [facility]) CM/SW Contact:  Kermit Balo, RN Phone Number: 10/25/2020, 12:17 PM   Clinical Narrative:    Patient is discharging home with home health services through [place]. CM spoke to patients wife this am and she prefers HH.  Walker for home is at the bedside.  Medications filled through [facility] pharmacy and [facility] provided.  Spouse arranging transport home. Cm provided spouse with number for [facility] cares to assist with the cost of vimpat after initial 30 days.    Final next level of care: Home w Home Health Services Barriers to Discharge: Inadequate or no insurance,Barriers Unresolved (comment)   Patient Goals and CMS Choice Patient states their goals for this hospitalization and ongoing recovery are:: to go home CMS Medicare.gov Compare Post Acute Care list provided to:: Patient Choice offered to / list presented to : [facility]  Discharge Placement                       Discharge Plan and Services In-house Referral: Clinical Social Work Discharge Planning Services: CM Consult Post Acute Care Choice: Home Health,Durable Medical Equipment          DME Arranged: Walker rolling DME Agency: [employer] Date DME Agency Contacted: 10/24/20   Representative spoke with at DME Agency: Sheila--through charity services HH Arranged: PT,OT,Speech Therapy HH Agency: [facility] Date [facility] Agency Contacted: 10/25/20 Time HH Agency Contacted: [phone] Representative spoke with at [facility] Agency: Kandee Keen  Social Determinants of Health (SDOH) Interventions     Readmission Risk Interventions No flowsheet data found.

## 2020-10-25 NOTE — Progress Notes (Signed)
Occupational Therapy Treatment Patient Details Name: Arthur [name] MRN: [id number] DOB: May 28, 1961 Today's Date: 10/25/2020    History of present illness 60 yo chronically ill man admitted 10/16/20 presented with garbled speech and AMS, transaminitis, acute renal failure. During TPA administration developed  Acute seizure requiring intubation. Dx with acute encephalopathy.  PMH includes ETOH abuse and Hypertension.   OT comments  Pt progressing toward OT goals. Pt completed upper body dressing with supervision. Pt required min guard for safety during oral care standing at sink, lower body dressing during sit/stand to pull up pants, and transfers. Pt required min verbal cues to follow multi-step directions. Pt continues to present with decreased orientation, safety awareness, memory, and problem solving. Pt discharge home today. Recommen HHOT to optimize safety, cognition, and balance during ADLs and IADLs.   Follow Up Recommendations  Home health OT    Equipment Recommendations  3 in 1 bedside commode    Recommendations for Other Services      Precautions / Restrictions Precautions Precautions: Fall Restrictions Weight Bearing Restrictions: No       Mobility Bed Mobility Overal bed mobility: Needs Assistance Bed Mobility: Supine to Sit;Sit to Supine     Supine to sit: Supervision Sit to supine: Supervision   General bed mobility comments: supervision for safety    Transfers Overall transfer level: Needs assistance Equipment used: None Transfers: Sit to/from Stand Sit to Stand: Min guard         General transfer comment: Pt required Min G for safety.    Balance Overall balance assessment: Needs assistance Sitting-balance support: No upper extremity supported;Feet supported Sitting balance-Leahy Scale: Good Sitting balance - Comments: Pt able to don UB and LB dressing while sitting EOB and sit/stand to pull up pants.   Standing balance support: No upper extremity  supported;During functional activity Standing balance-Leahy Scale: Fair Standing balance comment: Pt able to ambulate in room with no UE support                           ADL either performed or assessed with clinical judgement   ADL Overall ADL's : Needs assistance/impaired     Grooming: Oral care;Cueing for sequencing;Standing;Min guard Grooming Details (indicate cue type and reason): Pt required min verbal cues to scan for toothbrush at sink.         Upper Body Dressing : Supervision/safety;Cueing for sequencing;Sitting Upper Body Dressing Details (indicate cue type and reason): Pt required min verbal cues to locate shirt and initiate donning shirt. Lower Body Dressing: Min guard;Sit to/from stand Lower Body Dressing Details (indicate cue type and reason): Pt performed LB dressing with min Guard for safety. Pt with decreased ability to problem solve to remove SCDs before donning pants.             Functional mobility during ADLs: Min guard General ADL Comments: Pt presenting with decreased cognition, balance, and safety awareness. Pt completed ADLs in standing with min guard.     Vision       Perception     Praxis      Cognition Arousal/Alertness: Awake/alert Behavior During Therapy: WFL for tasks assessed/performed Overall Cognitive Status: Impaired/Different from baseline Area of Impairment: Memory;Following commands;Safety/judgement;Awareness;Problem solving;Orientation                 Orientation Level: Time (date) Current Attention Level: Sustained Memory: Decreased short-term memory Following Commands: Follows one step commands with increased time;Follows multi-step commands inconsistently Safety/Judgement: Decreased awareness  of deficits;Decreased awareness of safety Awareness: Emergent Problem Solving: Slow processing;Difficulty sequencing;Requires verbal cues General Comments: Pt oriented to person, place, and day, but had difficulty  recalling the date. Pt demonstrated improved ability to follow commands, but required verbal cues and reminders to complete multi-step task.        Exercises     Shoulder Instructions       General Comments Pt states he is ready to go home and he is feeling a lot better.    Pertinent Vitals/ Pain       Pain Assessment: No/denies pain  Home Living                                          Prior Functioning/Environment              Frequency  Min 2X/week        Progress Toward Goals  OT Goals(current goals can now be found in the care plan section)  Progress towards OT goals: Progressing toward goals  Acute Rehab OT Goals Patient Stated Goal: to go home OT Goal Formulation: With patient Time For Goal Achievement: 11/04/20 Potential to Achieve Goals: Good ADL Goals Pt Will Perform Grooming: standing;with supervision Pt Will Perform Lower Body Dressing: with min guard assist;sit to/from stand Pt Will Transfer to Toilet: with supervision;bedside commode;ambulating Pt Will Perform Toileting - Clothing Manipulation and hygiene: with supervision;sit to/from stand;sitting/lateral leans Additional ADL Goal #1: Pt will demonstrate emergent awareness during ADLs with Min cues Additional ADL Goal #2: Pt will tolerate performing three ADLs in standing at sink with Supervision  Plan Discharge plan needs to be updated    Co-evaluation                 AM-PAC OT "6 Clicks" Daily Activity     Outcome Measure   Help from another person eating meals?: None Help from another person taking care of personal grooming?: A Little Help from another person toileting, which includes using toliet, bedpan, or urinal?: A Little Help from another person bathing (including washing, rinsing, drying)?: A Little Help from another person to put on and taking off regular upper body clothing?: A Little Help from another person to put on and taking off regular lower body  clothing?: A Little 6 Click Score: 19    End of Session Equipment Utilized During Treatment: Gait belt  OT Visit Diagnosis: Unsteadiness on feet (R26.81);Other abnormalities of gait and mobility (R26.89);Muscle weakness (generalized) (M62.81)   Activity Tolerance Patient tolerated treatment well   Patient Left in chair;with call bell/phone within reach;with nursing/sitter in room (Nurse encouraged to set bed alarm prior to leaving room.)   Nurse Communication Mobility status        Time: 2426-8341 OT Time Calculation (min): 23 min  Charges: OT General Charges $OT Visit: 1 Visit OT Treatments $Self Care/Home Management : 23-37 mins  Ladene Artist, OTDS   Ladene Artist 10/25/2020, 2:15 PM

## 2020-10-25 NOTE — Discharge Summary (Signed)
Physician Discharge Summary  Arthur [name] SJG:[id number] DOB: [birth year] DOA: 10/16/2020  PCP: Dewayne Hatch, MD  Admit date: 10/16/2020 Discharge date: 10/25/2020  Time spent: 45 minutes  Recommendations for Outpatient Follow-up:  Patient will be discharged to home with therapy.  Patient will need to follow up with primary care provider within one week of discharge, repeat CBC and CMP, discuss restarting cholesterol medication as well as diabetes management.  Patient should continue medications as prescribed.  Patient should follow a carb modified diet.  Patient should not drive until cleared by his primary care physician or neurologist.  Discharge Diagnoses:  Acute metabolic encephalopathy Seizure disorder Acute respiratory failure with hypercarbia Acute kidney injury Thrombocytopenia Transaminitis Diabetes mellitus, type II Fever without leukocytosis Essential hypertension Acute blood loss anemia Hyponatremia Hypomagnesemia Hypokalemia Diarrhea Physical deconditioning  Discharge Condition: Stable  Diet recommendation: Carb modified  Filed Weights   10/23/20 0700 10/24/20 0323 10/25/20 0317  Weight: 60.9 kg 63.1 kg 62.4 kg    History of present illness:  on 10/16/2020 by Ms. 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 He was able to follow some commands and moved all extremities on arrival. Code stroke underway. Initial CTH neg. tPA was started then patient started to have tremulous movements and blood coming from mouth (noted to havecheek laceration) concerning for seizure activity, and given ETOH hx, tPA was thereforestopped. Suspected to have possible ETOH withdrawal seizures. He only received 5.1 mg of tPA at 1523 on 5/22. CTA head/ neck neg  for LVO. Given declining mental status, he was intubated for airway protection and placed on propofol for sedation. Per his wife via phone, he drinks "a lot" per wife,includingbeer and liquor, but only had one beer yesterday/ 5/21, none today.   Vitals noted 102.9, BP 177/123. Labs significant for Na 132, glucose 181, BUN 37, sCr 3.50 (previously 1.35 in 07/2020), normal WBC, plt 107, normal coags, alk phos 420, AST/ ALT 1350/ 405, t.bili 32. COVID and remaining labs pending. CXR neg. Ceftriaxone and vanc to be started after cultures obtained. Seen by Neurology given initial code stroke. PCCM called to admit.   Hospital Course:  Acute metabolic encephalopathy -Possibly secondary to seizure versus infection -CT head showed no acute abnormality -MRI showed abnormal diffusion signal in the left hippocampus.  Primary differential considerations are seizure effect and subacute infarct.  Multiple chronic infarcts. -Echocardiogram EF of 45 to 50%, LV demonstrates global hypokinesis.  Mild LVH.  Grade 1 diastolic dysfunction. -Patient was noted to have fever. -Cultures have been negative, HSV PCR negative as well (patient did have lumbar puncture on 5/23) -He was initially placed on antibiotics with vancomycin and ceftriaxone on admission as well as acyclovir however these medications have been discontinued -Mental status appears to have improved.  Patient is slow to respond but is alert and oriented x4  Seizure disorder -CT and MRI as above -EEG on 5/23 showed subclinical seizures -Neurology consulted and appreciated, recommendations are to continue Keppra 500 mg twice daily, Vimpat 50 mg twice daily.  Follow-up with neurology in 3 months.Continue seizure precautions, no driving for 6 months.  Acute respiratory failure with hypercarbia -Patient intubated due to mental status decline as well as airway protection.  Was extubated on 5/26. -Currently on room air  Acute kidney  injury -Suspected  to be due to hypovolemia -baseline creatinine in 2021 was 1.02 -Creatinine peaked to 3.50, now down to 1.42  -Repeat in one week  Thrombocytopenia -Suspect secondary to heavy alcohol use and possible drug side effects as patient was on Dilantin, Keppra, acyclovir, lites and lipid, Protonix which I will have a potential to affect platelet count -Platelets as low as 39 however trending upward, currently up to 143 -Repeat CBC in one week  Transaminitis -Initially patient had a meld score of 24 -Right upper quadrant ultrasound showed mild hepatic steatosis -Patient was placed on lactulose however this is now been discontinued -LFTs continue to trend downward -Repeat CMP in one week  Diabetes mellitus, type II -Hemoglobin A1c 6.8 -Will start patient on metformin, patient will need to follow-up closely with his PCP  Fever without leukocytosis -As above, patient was initially placed on antibiotics however these were discontinued as cultures have shown no growth to date  Essential hypertension -Continue amlodipine  Acute blood loss anemia -Suspected to be secondary to post seizure and tPA administration.  Patient was also noted to have oropharyngeal bleeding which has not resolved -No further evidence of bleeding -Hemoglobin currently 8 -Repeat CBC in one week  Hyponatremia -Resolved  Hypomagnesemia -Resolved with replacement  Hypokalemia -Resolved with replacement  Diarrhea -Patient currently afebrile with no leukocytosis -He has received lactulose as well as stool softeners during hospitalization -Diarrhea has improved without intervention  Physical deconditioning -Likely due to recent seizure activity -PT and OT had recommended CIR however family and patient feel this may be too expensive -Patient currently Medicaid pending, will discuss with case management the possibility of charity home health vs outpatient therapy- will also need  medication assistance -Would like to see patient improve more physically and mentally prior to discharge.  Have asked that physical, occupational, speech therapy continue to work with patient more frequently  Hyperlipidemia -hold statin until LFTs have normalized  Consultants PCCM Neurology Inpatient rehab  Procedures  Abdominal ultrasound Lumbar puncture  Echocardiogram EEG   Discharge Exam: Vitals:   10/25/20 0317 10/25/20 0824  BP: (!) 125/95 (!) 136/97  Pulse: 81 93  Resp: 14 18  Temp: 98.3 F (36.8 C) 98.4 F (36.9 C)  SpO2: 99% 100%     General: Well developed, chronically ill appearing, NAD  HEENT: NCAT, mucous membranes moist.  Cardiovascular: S1 S2 auscultated, RRR.  Respiratory: Clear to auscultation bilaterally  Abdomen: Soft, nontender, nondistended, + bowel sounds  Extremities: warm dry without cyanosis clubbing or edema  Neuro: AAOx3, slow to respond, generalized weakness  Psych: Appropriate mood and affect  Discharge Instructions Discharge Instructions    Ambulatory referral to Neurology   Complete by: As directed    An appointment is requested in approximately: 2-4- seizure   Ambulatory referral to Occupational Therapy   Complete by: As directed    Ambulatory referral to Physical Therapy   Complete by: As directed    Ambulatory referral to Speech Therapy   Complete by: As directed    Discharge instructions   Complete by: As directed    Patient will be discharged to home with therapy.  Patient will need to follow up with primary care provider within one week of discharge, repeat CBC and CMP, discuss restarting cholesterol medication as well as diabetes management.  Patient should continue medications as prescribed.  Patient should follow a carb modified diet.  Patient should not drive until cleared by his primary care physician or neurologist.  Per [facility] statutes, patients  with seizures are not allowed to drive until they  have been seizure-free for six months. Use caution when using heavy equipment or power tools. Avoid working on ladders or at heights. Take showers instead of baths. Ensure the water temperature is not too high on the home water heater. Do not go swimming alone. When caring for infants or small children, sit down when holding, feeding, or changing them to minimize risk of injury to the child in the event you have a seizure.   Discharge wound care:   Complete by: As directed    Place foam dressings over bilateral elbows. These can stay in place up to 3 days, then change.   Increase activity slowly   Complete by: As directed      Allergies as of 10/25/2020      Reactions   Penicillins Hives   Did it involve swelling of the face/tongue/throat, SOB, or low BP? Y Did it involve sudden or severe rash/hives, skin peeling, or any reaction on the inside of your mouth or nose? N Did you need to seek medical attention at a hospital or doctor's office? Y When did it last happen?Over 5 Years Ago If all above answers are "NO", may proceed with cephalosporin use.      Medication List    STOP taking these medications   atorvastatin 40 MG tablet Commonly known as: LIPITOR   atorvastatin 80 MG tablet Commonly known as: LIPITOR   lisinopril 40 MG tablet Commonly known as: ZESTRIL     TAKE these medications   amLODipine 10 MG tablet Commonly known as: NORVASC Take 1 tablet (10 mg total) by mouth daily. Start taking on: October 26, 2020 What changed: Another medication with the same name was removed. Continue taking this medication, and follow the directions you see here.   aspirin EC 81 MG tablet Take 81 mg by mouth daily. Swallow whole.   cloNIDine 0.1 MG tablet Commonly known as: CATAPRES Take 1 tablet (0.1 mg total) by mouth 2 (two) times daily.   feeding supplement Liqd Take 237 mLs by mouth 3 (three) times daily between meals.   folic acid 1 MG tablet Commonly known as:  FOLVITE Take 1 tablet (1 mg total) by mouth daily.   lacosamide 50 MG Tabs tablet Commonly known as: VIMPAT Take 1 tablet (50 mg total) by mouth 2 (two) times daily.   levETIRAcetam 500 MG tablet Commonly known as: KEPPRA Take 1 tablet (500 mg total) by mouth 2 (two) times daily.   metFORMIN 500 MG tablet Commonly known as: Glucophage Take 1 tablet (500 mg total) by mouth 2 (two) times daily with a meal.   multivitamin with minerals Tabs tablet Take 1 tablet by mouth daily. Start taking on: October 26, 2020   polyethylene glycol 17 g packet Commonly known as: MIRALAX / GLYCOLAX Take 17 g by mouth daily as needed for moderate constipation.   thiamine 100 MG tablet Take 1 tablet (100 mg total) by mouth daily.            Durable Medical Equipment  (From admission, onward)         Start     Ordered   10/24/20 0951  For home use only DME Walker rolling  Once       Question Answer Comment  Walker: With 5 Inch Wheels   Patient needs a walker to treat with the following condition Weakness      10/24/20 0950  Discharge Care Instructions  (From admission, onward)         Start     Ordered   10/25/20 0000  Discharge wound care:       Comments: Place foam dressings over bilateral elbows. These can stay in place up to 3 days, then change.   10/25/20 1001         Allergies  Allergen Reactions  . Penicillins Hives    Did it involve swelling of the face/tongue/throat, SOB, or low BP? Y Did it involve sudden or severe rash/hives, skin peeling, or any reaction on the inside of your mouth or nose? N Did you need to seek medical attention at a hospital or doctor's office? Y When did it last happen?Over 5 Years Ago If all above answers are "NO", may proceed with cephalosporin use.     Follow-up Information    [facility]   Specialty: Rehabilitation Why: The outpatient rehab will contact you for the  first appointment Contact information: [address] [place] [phone]               The results of significant diagnostics from this hospitalization (including imaging, microbiology, ancillary and laboratory) are listed below for reference.    Significant Diagnostic Studies: CT Head Wo Contrast  Result Date: 10/16/2020 CLINICAL DATA:  Evaluate for intracranial hemorrhage after administration of tPA in a patient with history of stroke. EXAM: CT HEAD WITHOUT CONTRAST TECHNIQUE: Contiguous axial images were obtained from the base of the skull through the vertex without intravenous contrast. COMPARISON:  None. FINDINGS: Brain: No evidence of acute infarction, hemorrhage, hydrocephalus, extra-axial collection or mass lesion/mass effect. Signs of prior infarct in the LEFT occipital region and RIGHT corona radiata, atrophy and chronic microvascular ischemic change without change. Vascular: No hyperdense vessel or unexpected calcification. Skull: Normal. Negative for fracture or focal lesion. Sinuses/Orbits: Sinus disease as on the recent comparison study. Other: None. IMPRESSION: 1. No acute intracranial abnormality. 2. Signs of prior infarct in the LEFT occipital region and RIGHT basal ganglia, atrophy and chronic microvascular ischemic change without change. 3. Sinus disease as on the recent comparison study. Electronically Signed   By: Zetta Bills M.D.   On: 10/16/2020 17:36   MR BRAIN W WO CONTRAST  Result Date: 10/17/2020 CLINICAL DATA:  Seizure, post tPA for possible stroke EXAM: MRI HEAD WITHOUT AND WITH CONTRAST TECHNIQUE: Multiplanar, multiecho pulse sequences of the brain and surrounding structures were obtained without and with intravenous contrast. CONTRAST:  7m GADAVIST GADOBUTROL 1 MMOL/ML IV SOLN COMPARISON:  None. FINDINGS: Brain: There is diffusion hyperintensity without evidence of restriction along the head and body of the  left hippocampus. No associated enhancement. There are multiple chronic infarcts including involvement of the left temporoparietal region, thalamus and basal ganglia, left parasagittal pons, and right cerebellum. Chronic blood products are associated with the left temporoparietal and right corona radiata/basal ganglia infarcts. Additional patchy and confluent areas of T2 hyperintensity in the supratentorial white matter are nonspecific but probably reflect moderate chronic microvascular ischemic changes. Prominence of the ventricles and sulci reflects generalized parenchymal volume loss. There is no intracranial mass or mass effect. There is no hydrocephalus or extra-axial fluid collection. No abnormal enhancement. Vascular: Major vessel flow voids at the skull base are preserved. Skull and upper cervical spine: Normal marrow signal is preserved. Sinuses/Orbits: Paranasal sinus mucosal thickening. Orbits are unremarkable. Other: Sella is unremarkable.  Mastoid air cells are clear. IMPRESSION:  Abnormal diffusion signal involving the left hippocampus. Primary differential considerations are seizure effect and subacute infarct. Multiple chronic infarcts. Electronically Signed   By: Macy Mis M.D.   On: 10/17/2020 19:26   US Abdomen Complete  Result Date: 10/16/2020 CLINICAL DATA:  Transaminitis EXAM: ABDOMEN ULTRASOUND COMPLETE COMPARISON:  None. FINDINGS: Gallbladder: No gallstones or wall thickening visualized. No sonographic Murphy sign noted by sonographer. Common bile duct: Diameter: 4 mm in proximal diameter Liver: The liver parenchyma is diffusely echogenic, in keeping with changes of mild hepatic steatosis, with focal fatty sparing noted within the gallbladder fossa. No focal intrahepatic masses are seen. There is no intrahepatic biliary ductal dilation. Portal vein is patent on color Doppler imaging with normal direction of blood flow towards the liver. IVC: No abnormality visualized. Pancreas:  Visualized portion unremarkable. Spleen: Size and appearance within normal limits. Right Kidney: Length: 10.6 cm. Echogenicity within normal limits. No mass or hydronephrosis visualized. Left Kidney: Length: 8.9 cm. Echogenicity within normal limits. No mass or hydronephrosis visualized. Abdominal aorta: No aneurysm visualized. Atherosclerotic calcification is seen within the abdominal aorta. Other findings: None. IMPRESSION: Mild hepatic steatosis. Aortic Atherosclerosis (ICD10-I70.0). Electronically Signed   By: Fidela Salisbury MD   On: 10/16/2020 22:46   DG Chest Portable 1 View  Result Date: 10/16/2020 CLINICAL DATA:  Code stroke.  Intubation. EXAM: PORTABLE CHEST 1 VIEW COMPARISON:  07/22/2008 chest radiograph FINDINGS: An endotracheal tube with tip 2.5 cm above the carina and NG tube entering the stomach noted. The cardiomediastinal silhouette is unremarkable. There is no evidence of focal airspace disease, pulmonary edema, suspicious pulmonary nodule/mass, pleural effusion, or pneumothorax. Fractures of the LATERAL RIGHT 6th, 7th and 8th ribs are new since 07/22/2008 but have a subacute appearance. Correlate clinically. IMPRESSION: 1. Endotracheal tube and NG tube as described.  Clear lungs. 2. RIGHT LATERAL 6th through 8th ribs of uncertain chronicity. Correlate clinically. Electronically Signed   By: Margarette Canada M.D.   On: 10/16/2020 16:54   EEG adult  Result Date: 10/16/2020 Lora Havens, MD     10/17/2020  9:18 AM Patient Name: BRANNON LEVENE MRN: [id number] Epilepsy Attending: Lora Havens Referring Physician/Provider: Dr Amie [place] Date: 10/16/2020 Duration: 42.27 mins Patient history: 60 year old with prior stroke with residual minimal speech deficit, hypertension, alcohol abuse presenting for evaluation of sudden onset of what EMS described as left facial weakness and word finding difficulty. EEG to evaluate for seizure. Level of alertness:  comatose AEDs during EEG study: LEV, Propofol  Technical aspects: This EEG study was done with scalp electrodes positioned according to the 10-20 International system of electrode placement. Electrical activity was acquired at a sampling rate of 500Hz and reviewed with a high frequency filter of 70Hz and a low frequency filter of 1Hz. EEG data were recorded continuously and digitally stored. Description:  EEG showed continuous generalized 3 to 6 Hz theta-delta slowing admixed with 15 to 18 Hz beta activity distributed symmetrically and diffusely.  Hyperventilation and photic stimulation were not performed.   P7 electrode artifact was noted during the study. ABNORMALITY - Continuous slow, generalized IMPRESSION: This study is suggestive of severe diffuse encephalopathy, nonspecific etiology but likely related to sedation.  No seizures or epileptiform discharges were seen during the study Lora Havens   ECHOCARDIOGRAM COMPLETE  Result Date: 10/17/2020    ECHOCARDIOGRAM REPORT   Patient Name:   Yashar A Mckown Date of Exam: 10/17/2020 Medical Rec #:  [id number]    Height:  61.0 in Accession #:    [id number]   Weight:       125.7 lb Date of Birth:  06/01/1960     BSA:          1.550 m Patient Age:    8 years     BP:           127/62 mmHg Patient Gender: M            HR:           66 bpm. Exam Location:  Inpatient Procedure: 2D Echo, Cardiac Doppler and Color Doppler Indications:    Stroke I 63.9  History:        Patient has prior history of Echocardiogram examinations, most                 recent 10/18/2019. Stroke; Risk Factors:Hypertension.  Sonographer:    Merrie Roof RDCS Referring Phys: [id number] ASHISH ARORA IMPRESSIONS  1. Left ventricular ejection fraction, by estimation, is 45 to 50%. The left ventricle has mildly decreased function. The left ventricle demonstrates global hypokinesis. There is moderate left ventricular hypertrophy. Left ventricular diastolic parameters are consistent with Grade I diastolic dysfunction (impaired relaxation).  2. Right  ventricular systolic function is mildly reduced. The right ventricular size is normal. Tricuspid regurgitation signal is inadequate for assessing PA pressure.  3. The mitral valve is normal in structure. No evidence of mitral valve regurgitation. No evidence of mitral stenosis.  4. The aortic valve is tricuspid. Aortic valve regurgitation is not visualized. Mild aortic valve sclerosis is present, with no evidence of aortic valve stenosis.  5. The inferior vena cava is normal in size with greater than 50% respiratory variability, suggesting right atrial pressure of 3 mmHg.  6. Technically difficult study with poor acoustic windows. FINDINGS  Left Ventricle: Left ventricular ejection fraction, by estimation, is 45 to 50%. The left ventricle has mildly decreased function. The left ventricle demonstrates global hypokinesis. The left ventricular internal cavity size was normal in size. There is  moderate left ventricular hypertrophy. Left ventricular diastolic parameters are consistent with Grade I diastolic dysfunction (impaired relaxation). Right Ventricle: The right ventricular size is normal. No increase in right ventricular wall thickness. Right ventricular systolic function is mildly reduced. Tricuspid regurgitation signal is inadequate for assessing PA pressure. Left Atrium: Left atrial size was normal in size. Right Atrium: Right atrial size was normal in size. Pericardium: There is no evidence of pericardial effusion. Mitral Valve: The mitral valve is normal in structure. There is mild calcification of the mitral valve leaflet(s). Mild mitral annular calcification. No evidence of mitral valve regurgitation. No evidence of mitral valve stenosis. Tricuspid Valve: The tricuspid valve is normal in structure. Tricuspid valve regurgitation is not demonstrated. Aortic Valve: The aortic valve is tricuspid. Aortic valve regurgitation is not visualized. Mild aortic valve sclerosis is present, with no evidence of aortic  valve stenosis. Pulmonic Valve: The pulmonic valve was normal in structure. Pulmonic valve regurgitation is not visualized. Aorta: The aortic root is normal in size and structure. Venous: The inferior vena cava is normal in size with greater than 50% respiratory variability, suggesting right atrial pressure of 3 mmHg. IAS/Shunts: No atrial level shunt detected by color flow Doppler.  LEFT VENTRICLE PLAX 2D LVIDd:         4.50 cm     Diastology LVIDs:         3.60 cm     LV e' medial:    6.64 cm/s  LV PW:         1.00 cm     LV E/e' medial:  8.0 LV IVS:        1.00 cm     LV e' lateral:   8.38 cm/s LVOT diam:     1.90 cm     LV E/e' lateral: 6.3 LV SV:         37 LV SV Index:   24 LVOT Area:     2.84 cm  LV Volumes (MOD) LV vol d, MOD A4C: 70.5 ml LV vol s, MOD A4C: 42.7 ml LV SV MOD A4C:     70.5 ml RIGHT VENTRICLE RV Basal diam:  3.40 cm RV S prime:     10.30 cm/s TAPSE (M-mode): 1.1 cm LEFT ATRIUM           Index       RIGHT ATRIUM           Index LA diam:      2.50 cm 1.61 cm/m  RA Area:     18.10 cm LA Vol (A4C): 30.6 ml 19.74 ml/m RA Volume:   50.60 ml  32.64 ml/m  AORTIC VALVE LVOT Vmax:   80.30 cm/s LVOT Vmean:  51.200 cm/s LVOT VTI:    0.131 m  AORTA Ao Root diam: 3.20 cm MITRAL VALVE MV Area (PHT): 2.77 cm    SHUNTS MV Decel Time: 274 msec    Systemic VTI:  0.13 m MV E velocity: 53.10 cm/s  Systemic Diam: 1.90 cm MV A velocity: 62.10 cm/s MV E/A ratio:  0.86 Loralie Champagne MD Electronically signed by Loralie Champagne MD Signature Date/Time: 10/17/2020/9:12:24 PM    Final    CT HEAD CODE STROKE WO CONTRAST  Result Date: 10/16/2020 CLINICAL DATA:  Code stroke.  Acute neuro deficit.  Slurred speech EXAM: CT HEAD WITHOUT CONTRAST TECHNIQUE: Contiguous axial images were obtained from the base of the skull through the vertex without intravenous contrast. COMPARISON:  CT head 08/14/2020 FINDINGS: Brain: Generalized atrophy. Chronic microvascular ischemic change in the white matter. Chronic infarct in the right  corona radiata unchanged. Chronic infarct in the left parietal lobe unchanged from the prior study. Chronic infarcts in the thalamus bilaterally. Negative for acute infarct, hemorrhage, mass Vascular: Negative for hyperdense vessel Skull: Negative Sinuses/Orbits: Mild mucosal edema paranasal sinuses. Negative orbit Other: None ASPECTS ([place] Stroke Program Early CT Score) - Ganglionic level infarction (caudate, lentiform nuclei, internal capsule, insula, M1-M3 cortex): 7 - Supraganglionic infarction (M4-M6 cortex): 3 Total score (0-10 with 10 being normal): 10 IMPRESSION: 1. No acute abnormality 2. Moderate chronic ischemic changes 3. ASPECTS is 10 4. Code stroke imaging results were communicated on 10/16/2020 at 3:24 pm to provider Rory Percy via text page Electronically Signed   By: Franchot Gallo M.D.   On: 10/16/2020 15:25   [place] EKG SITE RITE  Result Date: 10/18/2020 If Site Rite image not attached, placement could not be confirmed due to current cardiac rhythm.  CT ANGIO HEAD NECK W WO CM W PERF (CODE STROKE)  Result Date: 10/16/2020 CLINICAL DATA:  Stroke.  Slurred speech. EXAM: CT ANGIOGRAPHY HEAD AND NECK TECHNIQUE: Multidetector CT imaging of the head and neck was performed using the standard protocol during bolus administration of intravenous contrast. Multiplanar CT image reconstructions and MIPs were obtained to evaluate the vascular anatomy. Carotid stenosis measurements (when applicable) are obtained utilizing NASCET criteria, using the distal internal carotid diameter as the denominator. CONTRAST:  62mL OMNIPAQUE IOHEXOL 350 MG/ML  SOLN COMPARISON:  CT head 10/16/2020 FINDINGS: CTA NECK FINDINGS Aortic arch: Standard branching. Imaged portion shows no evidence of aneurysm or dissection. No significant stenosis of the major arch vessel origins. Atherosclerotic aortic arch. Right carotid system: Atherosclerotic disease proximal right internal carotid artery without significant stenosis. Left carotid  system: Moderate atherosclerotic disease left carotid bifurcation and proximal left internal carotid artery. Approximately 40% diameter stenosis left internal carotid artery due to calcific plaque. Vertebral arteries: Right vertebral artery dominant. Right vertebral artery patent to the basilar without stenosis. Left vertebral artery ends in PICA. Skeleton: Cervical spondylosis.  No acute skeletal abnormality. Other neck: Negative for mass or edema in the neck. Upper chest: Lung apices clear bilaterally. Review of the MIP images confirms the above findings CTA HEAD FINDINGS Anterior circulation: Internal carotid artery widely patent bilaterally. Anterior and middle cerebral arteries patent without large vessel occlusion. There is atherosclerotic irregularity in anterior and middle cerebral artery branches bilaterally. This is most prominent in the left MCA branches. Posterior circulation: Right vertebral artery supplies the basilar. The right PICA not visualized. Small right AICA patent. Left vertebral artery is small and ends in PICA. Basilar widely patent. Superior cerebellar and posterior cerebral arteries patent bilaterally. Fetal origin of the posterior cerebral artery bilaterally. Venous sinuses: Normal venous enhancement Anatomic variants: None Review of the MIP images confirms the above findings IMPRESSION: 1. Negative for intracranial large vessel occlusion 2. Intracranial atherosclerotic disease with vessel irregularity throughout the anterior and middle cerebral arteries bilaterally. This is most prominent in left MCA branches. 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 3. 40% diameter stenosis left internal carotid artery due to calcific plaque. Electronically Signed   By: Franchot Gallo M.D.   On: 10/16/2020 16:02    Microbiology: Recent Results (from the past 240 hour(s))  Resp Panel by RT-PCR (Flu A&B, Covid) Nasopharyngeal Swab     Status: None   Collection Time: 10/16/20  5:20 PM   Specimen:  Nasopharyngeal Swab; Nasopharyngeal(NP) swabs in vial transport medium  Result Value Ref Range Status   SARS Coronavirus 2 by RT PCR NEGATIVE NEGATIVE Final    Comment: (NOTE) SARS-CoV-2 target nucleic acids are NOT DETECTED.  The SARS-CoV-2 RNA is generally detectable in upper respiratory specimens during the acute phase of infection. The lowest concentration of SARS-CoV-2 viral copies this assay can detect is 138 copies/mL. A negative result does not preclude SARS-Cov-2 infection and should not be used as the sole basis for treatment or other patient management decisions. A negative result may occur with  improper specimen collection/handling, submission of specimen other than nasopharyngeal swab, presence of viral mutation(s) within the areas targeted by this assay, and inadequate number of viral copies(<138 copies/mL). A negative result must be combined with clinical observations, patient history, and epidemiological information. The expected result is Negative.  Fact Sheet for Patients:  EntrepreneurPulse.com.au  Fact Sheet for Healthcare Providers:  IncredibleEmployment.be  This test is no t yet approved or cleared by the [place] FDA and  has been authorized for detection and/or diagnosis of SARS-CoV-2 by FDA under an Emergency Use Authorization (EUA). This EUA will remain  in effect (meaning this test can be used) for the duration of the COVID-19 declaration under Section 564(b)(1) of the Act, 21 U.S.C.section 360bbb-3(b)(1), unless the authorization is terminated  or revoked sooner.       Influenza A by PCR NEGATIVE NEGATIVE Final   Influenza B by PCR NEGATIVE NEGATIVE Final    Comment: (NOTE) The Xpert Xpress  SARS-CoV-2/FLU/RSV plus assay is intended as an aid in the diagnosis of influenza from Nasopharyngeal swab specimens and should not be used as a sole basis for treatment. Nasal washings and aspirates are unacceptable for  Xpert Xpress SARS-CoV-2/FLU/RSV testing.  Fact Sheet for Patients: EntrepreneurPulse.com.au  Fact Sheet for Healthcare Providers: IncredibleEmployment.be  This test is not yet approved or cleared by the [place] FDA and has been authorized for detection and/or diagnosis of SARS-CoV-2 by FDA under an Emergency Use Authorization (EUA). This EUA will remain in effect (meaning this test can be used) for the duration of the COVID-19 declaration under Section 564(b)(1) of the Act, 21 U.S.C. section 360bbb-3(b)(1), unless the authorization is terminated or revoked.  Performed at [facility], [place] [address], [place], [place] [address]   MRSA PCR Screening     Status: None   Collection Time: 10/16/20  9:13 PM   Specimen: Nasal Mucosa; Nasopharyngeal  Result Value Ref Range Status   MRSA by PCR NEGATIVE NEGATIVE Final    Comment:        The GeneXpert MRSA Assay (FDA approved for NASAL specimens only), is one component of a comprehensive MRSA colonization surveillance program. It is not intended to diagnose MRSA infection nor to guide or monitor treatment for MRSA infections. Performed at [facility], [place] [address], [place], [place] [address]   Culture, blood (routine x 2)     Status: None   Collection Time: 10/16/20 10:03 PM   Specimen: BLOOD  Result Value Ref Range Status   Specimen Description BLOOD SITE NOT SPECIFIED  Final   Special Requests   Final    BOTTLES DRAWN AEROBIC AND ANAEROBIC Blood Culture results may not be optimal due to an inadequate volume of blood received in culture bottles   Culture   Final    NO GROWTH 5 DAYS Performed at [facility], [place] [address], [place], [place] [address]    Report Status 10/22/2020 FINAL  Final  Culture, blood (single)     Status: None   Collection Time: 10/16/20 10:03 PM   Specimen: BLOOD  Result Value Ref Range Status   Specimen Description BLOOD SITE NOT SPECIFIED   Final   Special Requests   Final    BOTTLES DRAWN AEROBIC ONLY Blood Culture adequate volume   Culture   Final    NO GROWTH 5 DAYS Performed at [facility], [place] [address] [place], [place] [address]    Report Status 10/22/2020 FINAL  Final  CSF culture w Stat Gram Stain     Status: None   Collection Time: 10/17/20  4:01 PM   Specimen: CSF; Cerebrospinal Fluid  Result Value Ref Range Status   Specimen Description CSF  Final   Special Requests NONE  Final   Gram Stain   Final    WBC PRESENT,BOTH PMN AND MONONUCLEAR NO ORGANISMS SEEN CYTOSPIN SMEAR    Culture   Final    NO GROWTH Performed at [facility], [place] [address] [place], [place] [address]    Report Status 10/21/2020 FINAL  Final     Labs: Basic Metabolic Panel: Recent Labs  Lab 10/18/20 2049 10/19/20 0522 10/19/20 0522 10/20/20 0454 10/21/20 0500 10/22/20 0125 10/23/20 0239 10/24/20 0103  NA  --  142   < > 142 142 140 138 137  K  --  3.4*   < > 3.6 3.3* 3.2* 3.9 3.7  CL  --  114*   < >  113* 111 107 108 108  CO2  --  23   < > 21* _0 GLUCOSE  --  120*   < > 92 83 106* 110* 95  BUN  --  14   < > _1 CREATININE  --  2.48*   < > 1.82* 1.75* 1.43* 1.43* 1.42*  CALCIUM  --  7.7*   < > 8.0* 8.2* 7.9* 8.0* 8.2*  MG 1.7 1.7  --   --  1.2* 2.1  --   --   PHOS 3.9 3.2  --   --  2.3* 3.9  --   --    < > = values in this interval not displayed.   Liver Function Tests: Recent Labs  Lab 10/19/20 0522 10/23/20 0239 10/24/20 0103  AST 262* 203* 176*  ALT 134* 109* 105*  ALKPHOS 218* 468* 419*  BILITOT 1.9* 1.3* 0.8  PROT 5.2* 6.8 6.0*  ALBUMIN 1.7* 2.4* 2.3*   No results for input(s): LIPASE, AMYLASE in the last 168 hours. No results for input(s): AMMONIA in the last 168 hours. CBC: Recent Labs  Lab 10/19/20 0522 10/20/20 0454 10/21/20 0500 10/23/20 0239 10/24/20 0103  WBC 7.2 7.7 10.9* 10.5 9.1  HGB 8.4* 9.1* 9.4* 9.4* 8.0*  HCT 26.1* 29.4* 28.6* 28.3* 24.4*  MCV  82.6 85.7 80.8 81.1 81.9  PLT 39* 47* 70* 107* 143*   Cardiac Enzymes: No results for input(s): CKTOTAL, CKMB, CKMBINDEX, TROPONINI in the last 168 hours. BNP: BNP (last 3 results) No results for input(s): BNP in the last 8760 hours.  ProBNP (last 3 results) No results for input(s): PROBNP in the last 8760 hours.  CBG: Recent Labs  Lab 10/24/20 0605 10/24/20 1147 10/24/20 1548 10/24/20 2109 10/25/20 0613  GLUCAP 93 90 117* 125* 87       Signed:  Anel Creighton  [facility] 10/25/2020, 10:01 AM

## 2020-11-02 ENCOUNTER — Other Ambulatory Visit: Payer: Self-pay | Admitting: *Deleted

## 2020-11-02 ENCOUNTER — Telehealth: Payer: Self-pay

## 2020-11-02 NOTE — Telephone Encounter (Signed)
Copied from CRM [id number]. Topic: General - Other >> Oct 31, 2020  1:34 PM Pawlus, Maxine Glenn A wrote: Reason for CRM: Pt had some questions regarding how to get an [place] card and requested a call back from [facility].   Called patient and LVM advising I was calling from [facility] in regards to the [place]. Advised patient to call [phone] to discuss further.    In order for patient to apply for financial through our clinic he must est care with a provider at our clinic. If patient wants to keep his current PCP he can apply for financial assistance directly through the hospital and can come pick up an application from our clinic.

## 2020-11-02 NOTE — Telephone Encounter (Signed)
Pt wife carmen is calling back and would like a return call

## 2020-11-02 NOTE — Patient Outreach (Signed)
[facility]) Care Management  11/02/2020  Arthur [name] 1961/04/05 [id number]   RED ON EMMI ALERT -  Day # 6 Date: 6/7 Red Alert Reason: Smoked or been around smoke   Outreach attempt #1, successful to member and wife.  Identity verified.  This care manager introduced self and stated purpose of call.  [facility] care management services explained.    Member lives with wife who is helping with management of health conditions.  She monitors his blood pressure as well as blood sugar daily.  He triggered red for smoking, which he says he smokes a black and mild cigar once a day.  He is aware that smoking will increase his risk for recurrent stroke.  He and his wife remain concerned about finances for insurance and medications as he still does not have insurance.  The wife did say that they have picked up all new medications but unsure how they will pay for it in the future.  Aware of financial counselor and pharmacy team at PCP office for assistance, will look further into.  He has an appointment with neurology on 8/24, but was told they would have a $200 copay.  He does not have an appointment with his PCP yet but I did notice a CCM scheduled for Monday.  They said they would call the office to schedule hospital follow up.  He has home health ordered with [facility] but they had not been out to visit.  I called them, they said their initial outreach was unsuccessful, would try again today.  Plan: RN CM will provide update to chronic care manager with PCP office, will close case at this time.  Kemper Durie, [place], MSN [facility] Care Management  [facility] Manager [phone]

## 2020-11-03 ENCOUNTER — Other Ambulatory Visit: Payer: Self-pay

## 2020-11-03 ENCOUNTER — Ambulatory Visit: Payer: Self-pay

## 2020-11-03 NOTE — Chronic Care Management (AMB) (Signed)
Care Management    RN Visit Note  11/03/2020 Name: Arthur [name] MRN: [id number] DOB: [birth year]  Subjective: Arthur [name] is a 60 y.o. year old male who is a primary care patient of [name], Shawna Orleans, MD. The care management team was consulted for assistance with disease management and care coordination needs.    Collaboration with hospital liason  and am  initiating care plan after acute stay from 10/16/20-10/25/20 for stroke. This is   in response to provider referral for case management and/or care coordination services.   Consent to Services:   Arthur [name] was given information about Care Management services today including:  Care Management services includes personalized support from designated clinical staff supervised by his physician, including individualized plan of care and coordination with other care providers 24/7 contact phone numbers for assistance for urgent and routine care needs. The patient may stop case management services at any time by phone call to the office staff.  Patient agreed to services and consent obtained.    Assessment:  s/p Stroke . See Care Plan below for interventions and patient self-care actives. Follow up Plan:  Appt with RNCM on 11/07/20.  Review of patient past medical history, allergies, medications, health status, including review of consultants reports, laboratory and other test data, was performed as part of comprehensive evaluation and provision of chronic care management services.   SDOH (Social Determinants of Health) assessments and interventions performed:    Care Plan  Allergies  Allergen Reactions   Penicillins Hives    Did it involve swelling of the face/tongue/throat, SOB, or low BP? Y Did it involve sudden or severe rash/hives, skin peeling, or any reaction on the inside of your mouth or nose? N Did you need to seek medical attention at a hospital or doctor's office? Y When did it last happen?  Over 5 Years Ago     If all above  answers are "NO", may proceed with cephalosporin use.     Outpatient Encounter Medications as of 11/03/2020  Medication Sig   amLODipine (NORVASC) 10 MG tablet Take 1 tablet (10 mg total) by mouth daily.   aspirin EC 81 MG tablet Take 81 mg by mouth daily. Swallow whole.   cloNIDine (CATAPRES) 0.1 MG tablet Take 1 tablet (0.1 mg total) by mouth 2 (two) times daily.   feeding supplement (ENSURE ENLIVE / ENSURE PLUS) LIQD Take 237 mLs by mouth 3 (three) times daily between meals.   folic acid (FOLVITE) 1 MG tablet Take 1 tablet (1 mg total) by mouth daily. (Patient not taking: No sig reported)   lacosamide (VIMPAT) 50 MG TABS tablet Take 1 tablet (50 mg total) by mouth 2 (two) times daily.   levETIRAcetam (KEPPRA) 500 MG tablet Take 1 tablet (500 mg total) by mouth 2 (two) times daily.   metFORMIN (GLUCOPHAGE) 500 MG tablet Take 1 tablet (500 mg total) by mouth 2 (two) times daily with a meal.   Multiple Vitamin (MULTIVITAMIN WITH MINERALS) TABS tablet Take 1 tablet by mouth daily.   polyethylene glycol (MIRALAX / GLYCOLAX) 17 g packet Take 17 g by mouth daily as needed for moderate constipation.   thiamine 100 MG tablet Take 1 tablet (100 mg total) by mouth daily. (Patient not taking: No sig reported)   No facility-administered encounter medications on file as of 11/03/2020.    Patient Active Problem List   Diagnosis Date Noted   Pressure injury of skin 10/19/2020   Protein-calorie malnutrition, severe 10/18/2020  Endotracheally intubated    Acute encephalopathy 10/16/2020   Toxic metabolic encephalopathy 10/16/2020   Transaminitis    Essential hypertension 10/08/2019   Cerebrovascular accident (CVA) (HCC) 10/02/2019    Conditions to be addressed/monitored:  s/p Stroke Care  Care Plan : Stroke (Adult)  Updates made by Arthur Gross, RN since 11/03/2020 12:00 AM     Problem: Emotional Adjustment to Disease (Stroke)      Problem: Residual Deficits (Stroke)      Long-Range  Goal: Residual Deficits Minimized/Symptom Recognition Improvement   Start Date: 11/03/2020  Expected End Date: 02/03/2021  This Visit's Progress: On track  Priority: High  Note:   Current Barriers:  Knowledge deficits related to post stroke care.  Received notice from hospital liaison stating:Patient triggered Emmi Stroke risk red for smoking, which he says he smokes a black and mild cigar once a day.  He is aware that smoking will increase his risk for recurrent stroke.  He and his wife remain concerned about finances for insurance and medications as he still does not have insurance.  The wife did say that they have picked up all new medications but unsure how they will pay for it in the future.  He has an appointment with neurology on 8/24, but was told they would have a $200 copay.  He does not have an appointment with his PCP yet and he was suppose to see PCP within a week or 2 after acute discharge. Smoking cessation Clinical Goal(s):  Collaboration with [name], Shawna Orleans, MD regarding development and update of comprehensive plan of care as evidenced by provider attestation and co-signature Inter-disciplinary care team collaboration (see longitudinal plan of care) patient will work with care management team to address care coordination and chronic disease management needs related to Care Coordination Medication Management and Education   Interventions:  Evaluation of current treatment plan related to CAD and HTN, Financial constraints related to lack of health insurance and Medication procurement self-management and patient's adherence to plan as established by provider. Collaboration with Chevis Pretty, MD regarding development and update of comprehensive plan of care as evidenced by provider attestation       and co-signature Inter-disciplinary care team collaboration (see longitudinal plan of care) Discussed plans with patient for ongoing care management follow up and provided  patient with direct contact information for care management team Explore the patient's and family's understanding of the disease; use open-ended questions. Support adjustment to with focus on maintaining daily life as closely as possible to life before stroke diagnosis. Refer to financial navigator or Child psychotherapist for thorough evaluation and resource assistance.  Self Care Activities:  Self administers medications as prescribed Attends all scheduled provider appointments Calls provider office for new concerns or questions Patient Goals:  Follow Up Plan: Telephone follow up appointment with care management team member scheduled for: 11/07/20     Plan: Telephone follow up appointment with care management team member scheduled for:  November 07, 2020 Arthur Gross, RN, BSN, CCM Care Management Coordinator [facility] Internal Medicine Phone: [phone] / Fax: [phone]

## 2020-11-03 NOTE — Chronic Care Management (AMB) (Signed)
   11/03/2020  Arthur [name] 04-12-1961 [id number]   Received message from hospital liaison regarding: Patient triggered EMMI red for smoking, which he says he smokes a black and mild cigar once a day.  He is aware that smoking will increase his risk for recurrent stroke.  He and his wife remain concerned about finances for insurance and medications as he still does not have insurance.  The wife did say that they have picked up all new medications but unsure how they will pay for it in the future.  He has an appointment with neurology on 8/24, but was told they would have a $200 copay.  He does not have an appointment with his PCP yet but I did notice a CCM scheduled for Monday.  They said they would call the office to schedule hospital follow up.  Email sent to Mardee Postin, at [facility] and [facility] to notify of recent acute admission.   Jodelle Gross, RN, BSN, CCM Care Management Coordinator [facility] Internal Medicine Phone: [phone] / Fax: [phone]

## 2020-11-03 NOTE — Telephone Encounter (Signed)
This encounter was created in error - please disregard.

## 2020-11-03 NOTE — Patient Instructions (Signed)
Visit Information   Goals Addressed   None     Collaboration with hospital liaison s/p acute admission and creation of Stroke care plan.   Telephone follow up appointment with care management team member scheduled for:  Jodelle Gross, RN, BSN, CCM Care Management Coordinator [facility] Internal Medicine Phone: [phone] / Fax: [phone]

## 2020-11-07 ENCOUNTER — Ambulatory Visit: Payer: Self-pay

## 2020-11-07 NOTE — Patient Instructions (Signed)
Visit Information  PATIENT GOALS:   Goals Addressed             This Visit's Progress    Secure long term medication copay assistance       Timeframe:  Long-Range Goal Priority:  High Start Date:      05/24/20                       Expected End Date:     02/24/21                  Follow Up Date 11/07/20   - call for medicine refill 2 or 3 days before it runs out - call if I am sick and can't take my medicine  - notify clinic pharmacist for medication copay assistance at least one week prior to needing refills   Why is this important?   These steps will help you keep on track with your medicines.           Mr. Bingley was given information about Care Management services by the embedded care coordination team including:  Care Management services include personalized support from designated clinical staff supervised by his physician, including individualized plan of care and coordination with other care providers 24/7 contact phone numbers for assistance for urgent and routine care needs. The patient may stop CCM services at any time (effective at the end of the month) by phone call to the office staff.  Patient agreed to services and verbal consent obtained.   The patient verbalized understanding of instructions, educational materials, and care plan provided today and agreed to receive a mailed copy of patient instructions, educational materials, and care plan.   Telephone follow up appointment with care management team member scheduled for: 4 weeks Jodelle Gross, RN, BSN, CCM Care Management Coordinator [facility] Internal Medicine Phone: [phone] / Fax: [phone]

## 2020-11-07 NOTE — Chronic Care Management (AMB) (Signed)
Care Management    RN Visit Note  11/07/2020 Name: MAXIMO SPRATLING MRN: [id number] DOB: 1961-05-18  Subjective: KYSON KUPPER is a 60 y.o. year old male who is a primary care patient of [name], [name], MD. The care management team was consulted for assistance with disease management and care coordination needs.    Engaged with patient by telephone for follow up visit in response to provider referral for case management and/or care coordination services.   Consent to Services:   Mr. Yepes was given information about Care Management services today including:  Care Management services includes personalized support from designated clinical staff supervised by his physician, including individualized plan of care and coordination with other care providers 24/7 contact phone numbers for assistance for urgent and routine care needs. The patient may stop case management services at any time by phone call to the office staff.  Patient agreed to services and consent obtained.    Assessment: Patient is currently experiencing difficulty with finances, affording medications.. See Care Plan below for interventions and patient self-care actives. Follow up Plan: Patient would like continued follow-up.  CCM RNCM will outreach the patient within the next 4 weeks  Patient will call office if needed prior to next encounter  Review of patient past medical history, allergies, medications, health status, including review of consultants reports, laboratory and other test data, was performed as part of comprehensive evaluation and provision of chronic care management services.   SDOH (Social Determinants of Health) assessments and interventions performed:    Care Plan  Allergies  Allergen Reactions   Penicillins Hives    Did it involve swelling of the face/tongue/throat, SOB, or low BP? Y Did it involve sudden or severe rash/hives, skin peeling, or any reaction on the inside of your mouth or nose? N Did you  need to seek medical attention at a hospital or doctor's office? Y When did it last happen?  Over 5 Years Ago     If all above answers are "NO", may proceed with cephalosporin use.     Outpatient Encounter Medications as of 11/07/2020  Medication Sig   amLODipine (NORVASC) 10 MG tablet Take 1 tablet (10 mg total) by mouth daily.   aspirin EC 81 MG tablet Take 81 mg by mouth daily. Swallow whole.   cloNIDine (CATAPRES) 0.1 MG tablet Take 1 tablet (0.1 mg total) by mouth 2 (two) times daily.   feeding supplement (ENSURE ENLIVE / ENSURE PLUS) LIQD Take 237 mLs by mouth 3 (three) times daily between meals.   folic acid (FOLVITE) 1 MG tablet Take 1 tablet (1 mg total) by mouth daily. (Patient not taking: No sig reported)   lacosamide (VIMPAT) 50 MG TABS tablet Take 1 tablet (50 mg total) by mouth 2 (two) times daily.   levETIRAcetam (KEPPRA) 500 MG tablet Take 1 tablet (500 mg total) by mouth 2 (two) times daily.   metFORMIN (GLUCOPHAGE) 500 MG tablet Take 1 tablet (500 mg total) by mouth 2 (two) times daily with a meal.   Multiple Vitamin (MULTIVITAMIN WITH MINERALS) TABS tablet Take 1 tablet by mouth daily.   polyethylene glycol (MIRALAX / GLYCOLAX) 17 g packet Take 17 g by mouth daily as needed for moderate constipation.   thiamine 100 MG tablet Take 1 tablet (100 mg total) by mouth daily. (Patient not taking: No sig reported)   No facility-administered encounter medications on file as of 11/07/2020.    Patient Active Problem List   Diagnosis Date Noted  Pressure injury of skin 10/19/2020   Protein-calorie malnutrition, severe 10/18/2020   Endotracheally intubated    Acute encephalopathy 79/15/0569   Toxic metabolic encephalopathy 79/48/0165   Transaminitis    Essential hypertension 10/08/2019   Cerebrovascular accident (CVA) ([place]) 10/02/2019    Conditions to be addressed/monitored: HTN and Stroke  Care Plan : Hypertension (Adult)  Updates made by [name], RN since  11/07/2020 12:00 AM     Problem: Disease Progression (Hypertension)   Priority: High  Onset Date: 05/24/2020     Goal: Disease Progression Prevented or Minimized   Start Date: 05/24/2020  Expected End Date: 02/22/2021  Recent Progress: On track  Priority: High  Note:   Objective:  Last practice recorded BP readings:  BP Readings from Last 3 Encounters:  08/14/20 (!) 149/91  01/11/20 (!) 145/88  11/19/19 (!) 143/88    Most recent eGFR/CrCl: No results found for: EGFR  No components found for: CRCL Current Barriers:  Difficulty obtaining medications- spoke with patient's significant other to update on patient's HTN since she monitors his blood pressure Unable to afford medication copay- Discussed progress with Medicaid application which is still pending. Per spouse, she spoke with the Medicaid office and it should be completed soon.  She was told 90 days and it has been over 60 days.  Spouse also is waiting to hear from someone in the financial aide office a [facility] about an orange card. Case Manager Clinical Goal(s):  Over the next 30-60 days, patient will verbalize understanding of plan for hypertension management Interventions:  Evaluation of current treatment plan related to hypertension self management and patient's adherence to plan as established by provider. Discussed cause and treatment received during patient's visit in May for new CVA/Seizure- Per spouse, member is no longer drinking ETOH but he is having one cigar per day.    Assessed patient's BP readings taken by wife- BP today 128/90, per wife he has been running in the 120's over 80. Reminded patient's wife to take patient's blood pressure daily and call clinic for consistent readings > 140/90 Reviewed the blood pressure treatment targets and explained why it is important to record pulse if BP readings are low Requested patient's wife keep this CCM RN updated on status of Medicaid decision Advised patient, providing  education and rationale, to monitor blood pressure daily and record, calling PCP for findings outside established parameters.  [place] pharmacy technician at the [facility] clinic to request she call the spouse to discuss his new medications and the cost of each. Reviewed scheduled/upcoming provider appointments including: None at present- Discussed with patient and spouse the importance of being seen in the clinic as the acute hospital dc note advises patient to be seen within one week of dc (10/25/20).  Spouse and patient agreed to be seen at the clinic and will message appointment desk to reach out to patient.  Patient Goals/Self-Care Activities Over the next 30-60 days, patient will:  - Self administers medications as prescribed Attends all scheduled provider appointments Calls provider office for new concerns, questions, or BP outside discussed parameters Checks BP and records as discussed Follows a low sodium diet/DASH diet Follow Up Plan: The care management team will reach out to the patient again over the next 30-60 days.      Care Plan : Stroke (Adult)  Updates made by [name], RN since 11/07/2020 12:00 AM     Problem: Residual Deficits (Stroke)      Long-Range Goal: Residual  Deficits Minimized/Symptom Recognition Improvement   Start Date: 11/03/2020  Expected End Date: 02/03/2021  Recent Progress: On track  Priority: High  Note:   Current Barriers:  Knowledge deficits related to post stroke care.  Received notice from hospital liaison stating:Patient triggered Emmi Stroke risk red for smoking, which he says he smokes a black and mild cigar once a day.  He is aware that smoking will increase his risk for recurrent stroke.  He and his wife remain concerned about finances for insurance and medications as he still does not have insurance.  The wife did say that they have picked up all new medications but unsure how they will pay for it in the  future.  He has an appointment with neurology on 8/24, but was told they would have a $200 copay.  He does not have an appointment with his PCP yet and he was suppose to see PCP within a week or 2 after acute discharge. Smoking cessation - Discussed importance of patient not smoking. He is currently smoking one cigar per day. Clinical Goal(s):  Collaboration with Dewayne Hatch, MD regarding development and update of comprehensive plan of care as evidenced by provider attestation and co-signature Inter-disciplinary care team collaboration (see longitudinal plan of care) patient will work with care management team to address care coordination and chronic disease management needs related to Care Coordination Medication Management and Education  -Unable to afford medication copay- Discussed progress with Medicaid application which is still pending. Per spouse, she spoke with the Medicaid office and it should be completed soon.  She was told 90 days and it has been over 60 days.  Spouse also is waiting to hear from someone in the financial aide office a [facility] about an orange card. Interventions:  Evaluation of current treatment plan related to CAD and HTN, Financial constraints related to lack of health insurance and Medication procurement self-management and patient's adherence to plan as established by provider. Collaboration with Dewayne Hatch, MD regarding development and update of comprehensive plan of care as evidenced by provider attestation       and co-signature Inter-disciplinary care team collaboration (see longitudinal plan of care)Messaged [place] technician at the [facility] and [facility] clinic to request she call the spouse to discuss his new medications and the cost of each Discussed plans with patient for ongoing care management follow up and provided patient with direct contact information for care management team- Provided RNCM contact information Support  adjustment to with focus on maintaining daily life as closely as possible to life before stroke diagnosis.  Refer to financial navigator or Education officer, museum for thorough evaluation and resource assistance. Request SW contact member to assist with resources for patient. Self Care Activities:  Self administers medications as prescribed Attends all scheduled provider appointments Calls provider office for new concerns or questions Patient Goals:  Follow Up Plan: Telephone follow up appointment with care management team member scheduled for: 4 weeks     Plan: Telephone follow up appointment with care management team member scheduled for:  4 weeks  [name], RN, BSN, CCM Care Management Coordinator [facility] Phone: [phone] / Fax: [phone]

## 2020-11-08 ENCOUNTER — Ambulatory Visit: Payer: Self-pay | Admitting: Licensed Clinical Social Worker

## 2020-11-08 NOTE — Chronic Care Management (AMB) (Signed)
  Care Management   Social Work Visit Note  11/08/2020 Name: TALBERT TREMBATH MRN: [id number] DOB: 06-11-1960  NASEAN ZAPF is a 60 y.o. year old male who sees [name], Shawna Orleans, MD for primary care. The care management team was consulted for assistance with care management and care coordination needs related to Community Resources      Interventions:  SW reviewed chart in preparation for initial visit. SW will contact DSS regarding status of Medicaid Application.    Christen Butter, BSW  Social Worker [facility] Care Management  [phone]

## 2020-11-09 ENCOUNTER — Other Ambulatory Visit: Payer: Self-pay | Admitting: *Deleted

## 2020-11-09 ENCOUNTER — Telehealth: Payer: Self-pay | Admitting: Licensed Clinical Social Worker

## 2020-11-09 NOTE — Patient Outreach (Signed)
[facility]) Care Management  11/09/2020  ELIODORO GULLETT [birth year] [id number]   RED ON EMMI ALERT - Stroke Day # 13 Date: 6/14 Red Alert Reason: Not had follow up appointment   Outreach attempt #1 successful to wife, DPR on file.  She report they still have not scheduled follow up with provider at Internal Medicine Clinic but they have had telephone visits with Social Worker and Case Manager.  Will have call again today with the Social worker to review Medicaid application status.  Advised of the need to schedule appointment with provider in the office as follow up with neurology isn't until August.  She verbalizes understanding, state she will call to schedule.   Plan: RN CM will close case at this time.  Active with chronic care management with PCP office.    Kemper Durie, [place], MSN [facility] Care Management  [facility] Manager [phone]

## 2020-11-09 NOTE — Telephone Encounter (Signed)
  Care Management   Follow Up Note   11/09/2020 Name: Arthur [name] MRN: [id number] DOB: Sep 05, 1960   Referred by: Chevis Pretty, MD Reason for referral : No chief complaint on file.   An unsuccessful telephone outreach was attempted today. The patient was referred to the case management team for assistance with care management and care coordination.   Follow Up Plan: Telephone follow up appointment with care management team member scheduled for: Within next 30 days.  Christen Butter, BSW  Social Worker [facility] Care Management  [phone]

## 2020-11-10 ENCOUNTER — Encounter (INDEPENDENT_AMBULATORY_CARE_PROVIDER_SITE_OTHER): Admitting: Family Medicine

## 2020-11-10 ENCOUNTER — Ambulatory Visit: Payer: Self-pay | Admitting: Licensed Clinical Social Worker

## 2020-11-10 NOTE — Patient Instructions (Signed)
Visit Information  Instructions: patient will work with SW to address concerns related to financial constraints.  Patient was given the following information about care management and care coordination services today, agreed to services, and gave verbal consent: 1.care management/care coordination services include personalized support from designated clinical staff supervised by their physician, including individualized plan of care and coordination with other care providers 2. 24/7 contact phone numbers for assistance for urgent and routine care needs. 3. The patient may stop care management/care coordination services at any time by phone call to the office staff.  Patient verbalizes understanding of instructions provided today and agrees to view in MyChart.   Telephone follow up appointment with care management team member scheduled for: Next 30 days  Christen Butter, [place]  Social Worker [facility] Care Management  [phone]

## 2020-11-10 NOTE — Chronic Care Management (AMB) (Signed)
  Care Management   Social Work Visit Note  11/10/2020 Name: Arthur [name] MRN: [id number] DOB: 04/05/1961  Arthur [name] is a 60 y.o. year old male who sees [name], Shawna Orleans, MD for primary care. The care management team was consulted for assistance with care management and care coordination needs related to Financial Difficulties related to medical insurance.     Patient was given the following information about care management and care coordination services today, agreed to services, and gave verbal consent: 1.care management/care coordination services include personalized support from designated clinical staff supervised by their physician, including individualized plan of care and coordination with other care providers 2. 24/7 contact phone numbers for assistance for urgent and routine care needs. 3. The patient may stop care management/care coordination services at any time by phone call to the office staff.  Engaged with patient by telephone for initial visit in response to provider referral for social work chronic care management and care coordination services.  Assessment: Review of patient history, allergies, and health status during evaluation of patient need for care management/care coordination services.    Interventions:  Patient interviewed and appropriate assessments performed Collaborated with clinical team regarding patient needs  Collaborated with care guide team regarding assistance with financial constraints. Patient applied for medicaid and disability. Patient recently received a denial letter for disability.  SW advised patient to appeal denial letter. Patient will also need to appeal the denial for medicaid once received.  SW spoke with patient wife. Patient wife stated she would start the appeal on today and has all information required to do so.    SDOH (Social Determinants of Health) assessments performed: Yes     Plan:  patient will work with BSW to address needs  related to Financial constraints  Patient will appeal disability and medicaid denial. SW will follow up within 30 days.   Arthur [name], BSW  Social Worker [facility] Care Management  [phone]

## 2020-11-16 ENCOUNTER — Other Ambulatory Visit: Payer: Self-pay | Admitting: Internal Medicine

## 2020-11-16 ENCOUNTER — Other Ambulatory Visit: Payer: Self-pay

## 2020-11-16 MED ORDER — CLONIDINE HCL 0.1 MG PO TABS
0.1000 mg | ORAL_TABLET | Freq: Two times a day (BID) | ORAL | 0 refills | Status: DC
  Filled 2020-11-16: qty 60, 30d supply, fill #0

## 2020-11-16 MED ORDER — AMLODIPINE BESYLATE 10 MG PO TABS
10.0000 mg | ORAL_TABLET | Freq: Every day | ORAL | 0 refills | Status: DC
  Filled 2020-11-16: qty 30, 30d supply, fill #0

## 2020-11-16 MED ORDER — ADULT MULTIVITAMIN W/MINERALS CH: 1.0000 | ORAL_TABLET | Freq: Every day | ORAL | Status: DC

## 2020-11-16 MED ORDER — METFORMIN HCL 500 MG PO TABS
500.0000 mg | ORAL_TABLET | Freq: Two times a day (BID) | ORAL | 0 refills | Status: DC
  Filled 2020-11-16: qty 60, 30d supply, fill #0

## 2020-11-16 MED ORDER — LACOSAMIDE 50 MG PO TABS
50.0000 mg | ORAL_TABLET | Freq: Two times a day (BID) | ORAL | 0 refills | Status: DC
Start: 2020-11-16 — End: 2020-12-05
  Filled 2020-11-16 – 2020-11-17 (×2): qty 60, 30d supply, fill #0

## 2020-11-16 NOTE — Telephone Encounter (Signed)
I will provide a 30d supply of the requested meds however he needs follow up within this time.

## 2020-11-16 NOTE — Telephone Encounter (Signed)
Pt is requesting his cloNIDine (CATAPRES) 0.1 MG tablet amLODipine (NORVASC) 10 MG tablet levETIRAcetam (KEPPRA) 500 MG tablet metFORMIN (GLUCOPHAGE) 500 MG tablet  sent to  [facility] Phone:  [phone]  Fax:  [phone]

## 2020-11-17 ENCOUNTER — Telehealth: Payer: Self-pay | Admitting: Internal Medicine

## 2020-11-17 ENCOUNTER — Other Ambulatory Visit: Payer: Self-pay

## 2020-11-17 NOTE — Telephone Encounter (Signed)
Refill Request-  Pt wife states she has called the following pharmacy and they told her they will have to send the following medication somewhere else and she is confused about what to do.  She states they are limited on transportation and needs to know what to do.  Pt was instructed to call the clinic.  Please call the patient back.  lacosamide (VIMPAT) 50 MG TABS tablet  [facility] (Ph: [phone])

## 2020-11-17 NOTE — Telephone Encounter (Signed)
TC to [facility], spoke w/ Pharmacy tech who states "this is now supposed to go through Patient Assistance and they spoke with the doctor and Durward Mallard".  Durward Mallard, Will you please call the patient or his EC?  EC was upset this morning and didn't understand why they couldn't get the RX at [facility]. Thank you, Shermar Friedland

## 2020-11-17 NOTE — Telephone Encounter (Signed)
Thank you ladies. 

## 2020-11-17 NOTE — Telephone Encounter (Signed)
RTC to Arthur [name] [facility]) who states the [facility] told her they might have to get the Vimpat somewhere else and she doesn't understand why.  She states they have been getting the medication at this pharmacy.  TC to [facility] [employer], no answer, will try again later this afternoon. SCha[place], RN,BSN

## 2020-11-17 NOTE — Telephone Encounter (Signed)
Spoke with Patients wife and they will pick up an application and mail it in directly to [facility].

## 2020-11-18 ENCOUNTER — Other Ambulatory Visit: Payer: Self-pay

## 2020-11-21 ENCOUNTER — Telehealth: Payer: Self-pay

## 2020-11-22 ENCOUNTER — Ambulatory Visit (INDEPENDENT_AMBULATORY_CARE_PROVIDER_SITE_OTHER): Payer: Self-pay | Admitting: Internal Medicine

## 2020-11-22 ENCOUNTER — Ambulatory Visit: Payer: Self-pay | Admitting: *Deleted

## 2020-11-22 ENCOUNTER — Other Ambulatory Visit: Payer: Self-pay

## 2020-11-22 ENCOUNTER — Encounter: Payer: Self-pay | Admitting: Internal Medicine

## 2020-11-22 DIAGNOSIS — F1020 Alcohol dependence, uncomplicated: Secondary | ICD-10-CM

## 2020-11-22 DIAGNOSIS — Z8673 Personal history of transient ischemic attack (TIA), and cerebral infarction without residual deficits: Secondary | ICD-10-CM

## 2020-11-22 DIAGNOSIS — R7401 Elevation of levels of liver transaminase levels: Secondary | ICD-10-CM

## 2020-11-22 DIAGNOSIS — I700 Atherosclerosis of aorta: Secondary | ICD-10-CM | POA: Insufficient documentation

## 2020-11-22 DIAGNOSIS — D696 Thrombocytopenia, unspecified: Secondary | ICD-10-CM | POA: Insufficient documentation

## 2020-11-22 DIAGNOSIS — I10 Essential (primary) hypertension: Secondary | ICD-10-CM

## 2020-11-22 DIAGNOSIS — E785 Hyperlipidemia, unspecified: Secondary | ICD-10-CM | POA: Insufficient documentation

## 2020-11-22 DIAGNOSIS — D649 Anemia, unspecified: Secondary | ICD-10-CM

## 2020-11-22 DIAGNOSIS — E1129 Type 2 diabetes mellitus with other diabetic kidney complication: Secondary | ICD-10-CM | POA: Insufficient documentation

## 2020-11-22 DIAGNOSIS — E119 Type 2 diabetes mellitus without complications: Secondary | ICD-10-CM

## 2020-11-22 DIAGNOSIS — Z87898 Personal history of other specified conditions: Secondary | ICD-10-CM

## 2020-11-22 DIAGNOSIS — G9340 Encephalopathy, unspecified: Secondary | ICD-10-CM

## 2020-11-22 NOTE — Assessment & Plan Note (Signed)
Noted on his admission in May. Suspect this was in the setting of critical illness and alcohol use.  I recommended rechecking them today however he declined due to financial barriers and being uninsured.  Plan -strongly encourage repeating labs at time of follow up -would defer restarting a statin until resolution -avoid hepatotoxins

## 2020-11-22 NOTE — Assessment & Plan Note (Addendum)
Reports complete abstinence since discharge. Denies cravings or withdrawal symptoms. I reiterated the importance to abstain from alcohol and to present for admission prior to any future attempts to quit, should he start drinking again, as he is high risk for DTs  Discussed that there are medications that can help with cravings, should he feel he needs it in the future.  Continue to monitor at future visits.

## 2020-11-22 NOTE — Assessment & Plan Note (Signed)
Hemoglobin was 13 in 2021. 12.4 in March of this year. Over the course of his hospitalization this May, hemoglobin trended down to 8.0. Target cells were noted.  TODAY: denies overt blood loss. Discussed the need to obtain follow up labs however he declines at today's visit due to lack of insurance.  Plan -would strongly encourage labs at his next office visit, including CBC w/ diff and iron studies

## 2020-11-22 NOTE — Assessment & Plan Note (Signed)
He presents for hospital follow up today. He is currently on vimpat and keppra. Denies recurrent seizure like activity since discharge. He had requested medication refills a few days ago. I provided a 30d supply but reiterated the importance of him needing to follow with neurology for these medications. Unfortunately, cost is going to be a barrier to ongoing therapy secondary to being uninsured.  His wife brought patient assistance paperwork to today's visit. I again reiterated that these need to come through the neurology clinic as seizure treatment is outside the scope of general internists and he may require lab monitoring. The patient assistance paperwork needs to be signed by neurology. They expressed understanding of this at the time however, upon [place], CM, chatting with them a few minutes later, it seems that they must have misunderstood because they asked her to work on that paperwork. She also reiterated that it needs to be done through neurology.  Plan -Follow up with neurology scheduled for 8/24.  -provided a 30d supply of vimpat on 6/22 -future refills for vimpat and keppra are to go through neurology. Wife is aware to call their office  -continue to refrain from alcohol use

## 2020-11-22 NOTE — Progress Notes (Signed)
Office Visit   Patient ID: Arthur [name], male    DOB: 08/10/1960, 60 y.o.   MRN: [id number]   PCP: Arthur Sacramento, MD   Subjective:  Arthur [name] is a 60 y.o. year old male with hypertension, hyperlipidemia, alcohol use disorder, and history of CVA in 2021 who presents for hospital follow up.   He was admitted from 5/22-5/31/22 for presumed ETOh withdrawal seizures.  He initially presented as a code stroke with dysphasia and questionable left facial droop.  After negative head CT, tPA was initiated, at which time he developed seizure like activity and blood from his mouth from a cheek laceration. Due to declining mental status, intubation was required for airway protection and was subsequently transferred to ICU. Mental status slowly improved, allowing for extubation on 5/26.  Head CT and MRI were not convincing for an acute stroke, although there was an area involving the hippocampus that could have represented a subacute infarct vs seizure effect. EEG showed subclinical seizure activity.  Over the course of admission, he developed a fever. He underwent an LP on 5/23 and HSV PCR was negative. He was placed on vancomycin, rocephin and acyclovir.  Echocardiogram showed mrEF 45-50% with global LV hypokinesis, mild LVH, G1DD.   Discharge medications: keppra 500mg  BID, vimpat 50mg  BID.  Follow up appointment: Neurology; January 18, 2021  TODAY: Since discharge, he denies recurrent seizure like activity. A few days ago, I had received a refill request for keppra and vimpat. I explained to them today that these will ultimately need to be prescribed through the neurologist and will require lab monitoring. His wife brought along patient assistance paperwork to today's visit. I explained that this also needs to go through the neurology office.   Alcohol use disorder Prior to admission, he was drinking two 24oz mixed drinks and 6-8 small shot bottles of hard liquor per day.  TODAY: Since being  discharged, he reports complete abstinence. He denies withdrawal symptoms or cravings.   Type 2 DM A1C was found to be 6.8. Discharge medication: metformin 500mg  BID  TODAY: I reviewed the diagnosis of diabetes with them, what diabetes is, complications of diabetes, monitoring, and dietary measures.  He reports tolerating the metformin without adverse effects. He has been drinking a lot of sugary drinks.   Acute anemia Hemoglobin was 13 in 2021. 12.4 in March of this year. Over the course of his hospitalization, hemoglobin trended down to 8.0. Presumed to be blood loss anemia. Target cells were noted.  TODAY: denies overt blood loss. Discussed the need to obtain follow up labs however he declines at today's visit due to lack of insurance.  Other He was also noted to have transaminitis and thrombocytopenia, suspected to be primarily attibutable to alcohol use and exacerbated by medications (dilantin, keppra, acyclovir). RUQ showed mild hepatic steatosis.   I reviewed the course of the admission with him. Explained that it was thought that he had a seizure most likely from alcohol withdrawal. Explained that the area of the hippocampus that had been abnormal may have been subacute stroke vs 2/2 seizure activity.    Objective:   BP 127/89 (BP Location: Right Arm, Patient Position: Sitting, Cuff Size: Normal)   Pulse 95   Temp 98.6 F (37 C) (Oral)   Ht 5\' 3"  (1.6 m)   Wt 138 lb 4.8 oz (62.7 kg)   SpO2 100% Comment: RA  BMI 24.50 kg/m  BP Readings from Last 3 Encounters:  11/22/20 127/89  10/25/20 (!) 136/97  08/14/20 (!) 149/91   Eyes: no conjunctival pallor Cardiac: RRR, no LE edema Pulm: diminished lung sounds throughout Neuro: alert, oriented. No dysarthria. CN II-XII intact. 5/5 strength in bilateral upper and lower extremities with mild deficit in the right upper compared to the left. No tremor  Assessment & Plan:   Problem List Items Addressed This Visit        Cardiovascular and Mediastinum   Essential hypertension (Chronic)    Blood pressure is at goal in the office today--127/89 -continue amlodipine 10mg  and clonidine 0.1mg  BID       Aortic atherosclerosis (HCC) (Chronic)     Endocrine   Type 2 diabetes mellitus (HCC) (Chronic)    Diagnosed on admission in May at which time A1C was 6.8. Discharged on 500mg  metformin BID. Denies adverse medication effects.  Reviewed diagnosis, monitoring, treatment, and lifestyle changes with him and his wife.  Will need retinopathy screening at some point however uninsured at this time so will need referral upon obtaining insurance. Foot exam deferred at today's visit due to multiple other issues.  Plan -f/u in August for diabetes follow up visit -foot exam will need to be done at that time -refer to ophthalmology upon obtaining insurance -continue metformin 500mg  BID for now         Nervous and Auditory   RESOLVED: Acute encephalopathy    Admitted in May for ETOH withdrawal seizure. Encephalopathy is resolved today.         Other   History of seizures (Chronic)    He presents for hospital follow up today. He is currently on vimpat and keppra. Denies recurrent seizure like activity since discharge. He had requested medication refills a few days ago. I provided a 30d supply but reiterated the importance of him needing to follow with neurology for these medications. Unfortunately, cost is going to be a barrier to ongoing therapy secondary to being uninsured.  His wife brought patient assistance paperwork to today's visit. I again reiterated that these need to come through the neurology clinic as seizure treatment is outside the scope of general internists and he may require lab monitoring. The patient assistance paperwork needs to be signed by neurology. They expressed understanding of this at the time however, upon [place], CM, chatting with them a few minutes later, it seems that they must have  misunderstood because they asked her to work on that paperwork. She also reiterated that it needs to be done through neurology.  Plan -Follow up with neurology scheduled for 8/24.  -provided a 30d supply of vimpat on 6/22 -future refills for vimpat and keppra are to go through neurology. Wife is aware to call their office  -continue to refrain from alcohol use       Alcohol use disorder, severe, dependence (HCC) (Chronic)    Reports complete abstinence since discharge. Denies cravings or withdrawal symptoms. I reiterated the importance to abstain from alcohol and to present for admission prior to any future attempts to quit, should he start drinking again, as he is high risk for DTs  Discussed that there are medications that can help with cravings, should he feel he needs it in the future.  Continue to monitor at future visits.       Hyperlipidemia (Chronic)    On lipitor prior to this last admission in May. Statin was held at discharge, presumably in the setting of liver injury.  -would resume statin therapy as soon as appropriate given history of stroke  and hyperlipidemia.       History of CVA (cerebrovascular accident)    Recently admitted in May for ETOH withdrawal seizure. Head imaging showed possible subacute infarct vs seizure sequale in the hippocampus.  No new neurodeficits at today's visit.  Antiplatelet agent: aspirin 81mg  Previously on lipitor however is not on their med list today. Suspect it was stopped due to elevated transaminases this last admission.  -resume high intensity statin upon resolution of transaminitis Continue to work on blood pressure, lipid and glycemic control.        Transaminitis    Noted on his admission in May. Suspect this was in the setting of critical illness and alcohol use.  I recommended rechecking them today however he declined due to financial barriers and being uninsured.  Plan -strongly encourage repeating labs at time of follow  up -would defer restarting a statin until resolution -avoid hepatotoxins       Anemia    Hemoglobin was 13 in 2021. 12.4 in March of this year. Over the course of his hospitalization this May, hemoglobin trended down to 8.0. Target cells were noted.  TODAY: denies overt blood loss. Discussed the need to obtain follow up labs however he declines at today's visit due to lack of insurance.  Plan -would strongly encourage labs at his next office visit, including CBC w/ diff and iron studies       Thrombocytopenia (HCC)    He developed thrombocytopenia over the admission in May. Slowly improved over the admission but remained low at time of discharge.  I strongly recommended repeating labs today however he declines due to financial barriers and being uninsured. Plan -strongly encourage CBC at time of follow up         Return Follow up in August for diabetes recheck.   Pt discussed with Dr. September, MD Internal Medicine Resident PGY-2 [facility] Pager: [phone] 11/22/2020 6:24 PM

## 2020-11-22 NOTE — Assessment & Plan Note (Signed)
On lipitor prior to this last admission in May. Statin was held at discharge, presumably in the setting of liver injury.  -would resume statin therapy as soon as appropriate given history of stroke and hyperlipidemia.

## 2020-11-22 NOTE — Assessment & Plan Note (Signed)
He developed thrombocytopenia over the admission in May. Slowly improved over the admission but remained low at time of discharge.  I strongly recommended repeating labs today however he declines due to financial barriers and being uninsured. Plan -strongly encourage CBC at time of follow up

## 2020-11-22 NOTE — Chronic Care Management (AMB) (Signed)
Care Management    RN Visit Note  11/23/2020 Name: Arthur [name] MRN: [id number] DOB: [birth year]  Subjective: Arthur [name] is a 60 y.o. year old male who is a primary care patient of Arthur Manes, MD. The care management team was consulted for assistance with disease management and care coordination needs.    Engaged with patient face to face for follow up visit in response to provider referral for case management and/or care coordination services.   Consent to Services:   [name] was given information about Care Management services today including:  Care Management services includes personalized support from designated clinical staff supervised by his physician, including individualized plan of care and coordination with other care providers 24/7 contact phone numbers for assistance for urgent and routine care needs. The patient may stop case management services at any time by phone call to the office staff.  Patient agreed to services and consent obtained.   Assessment: Review of patient past medical history, allergies, medications, health status, including review of consultants reports, laboratory and other test data, was performed as part of comprehensive evaluation and provision of chronic care management services.   SDOH (Social Determinants of Health) assessments and interventions performed:    Care Plan  Allergies  Allergen Reactions   Penicillins Hives    Did it involve swelling of the face/tongue/throat, SOB, or low BP? Y Did it involve sudden or severe rash/hives, skin peeling, or any reaction on the inside of your mouth or nose? N Did you need to seek medical attention at a hospital or doctor's office? Y When did it last happen?  Over 5 Years Ago     If all above answers are "NO", may proceed with cephalosporin use.     Outpatient Encounter Medications as of 11/22/2020  Medication Sig   amLODipine (NORVASC) 10 MG tablet Take 1 tablet (10 mg total) by mouth daily.    aspirin EC 81 MG tablet Take 81 mg by mouth daily. Swallow whole.   cloNIDine (CATAPRES) 0.1 MG tablet Take 1 tablet (0.1 mg total) by mouth 2 (two) times daily.   feeding supplement (ENSURE ENLIVE / ENSURE PLUS) LIQD Take 237 mLs by mouth 3 (three) times daily between meals.   lacosamide (VIMPAT) 50 MG TABS tablet Take 1 tablet (50 mg total) by mouth 2 (two) times daily.   levETIRAcetam (KEPPRA) 500 MG tablet Take 1 tablet (500 mg total) by mouth 2 (two) times daily.   metFORMIN (GLUCOPHAGE) 500 MG tablet Take 1 tablet (500 mg total) by mouth 2 (two) times daily with a meal. IM PROGRAM   Multiple Vitamin (MULTIVITAMIN WITH MINERALS) TABS tablet Take 1 tablet by mouth daily.   [DISCONTINUED] polyethylene glycol (MIRALAX / GLYCOLAX) 17 g packet Take 17 g by mouth daily as needed for moderate constipation.   No facility-administered encounter medications on file as of 11/22/2020.    Patient Active Problem List   Diagnosis Date Noted   Aortic atherosclerosis ([place]) 11/22/2020   History of seizures 11/22/2020   Alcohol use disorder, severe, dependence ([place]) 11/22/2020   Type 2 diabetes mellitus ([place]) 11/22/2020   Anemia 11/22/2020   Thrombocytopenia ([place]) 11/22/2020   Hyperlipidemia 11/22/2020   Transaminitis    Essential hypertension 10/08/2019   History of CVA (cerebrovascular accident) 10/02/2019    Conditions to be addressed/monitored:  HTN,  s/p stroke, seizure disorder   Care Plan : Hypertension (Adult)  Updates made by [name], RN since 11/23/2020 12:00 AM  Problem: Disease Progression (Hypertension)   Priority: High  Onset Date: 05/24/2020     Goal: Disease Progression Prevented or Minimized   Start Date: 05/24/2020  Expected End Date: 02/22/2021  Recent Progress: On track  Priority: High  Note:   Objective:  Last practice recorded BP readings:  BP Readings from Last 3 Encounters:  11/22/20 127/89  10/25/20 (!) 136/97  08/14/20 (!) 149/91   Most recent  eGFR/CrCl: No results found for: EGFR  No components found for: CRCL Current Barriers:  Difficulty obtaining medications-  Unable to afford medication copay- Met with patient and his wife Arthur [name]  and Arthur [name]'s mentally challenged son while in clinic for post hospital discharge ( he was hopitalized 5/22-5/31 with d/c dx of Acute metabolic encephalopathy,Seizure disorder, Acute respiratory failure with hypercarbia requiring intubation, Acute kidney injury, Thrombocytopenia ,Transaminitis and etoh intoxication) for follow up , Arthur [name] states she completed patient assistance program application for one of patient's seizure medications,  Vimpat, as she continues to have difficulty affording to pay medication copays as she states they have little to no income, she says both long term disability and Medicaid were denied and they were advised to appeal the denials which they are doing Case Manager Clinical Goal(s):  Over the next 30-60 days, patient will verbalize understanding of plan for hypertension management Interventions:  Evaluation of current treatment plan related to hypertension self management and patient's adherence to plan as established by provider. Took completed patient assistance medication application for Vimpat and advised patient and wife the form will be faxed to patient's neurologist for provider completion Discussed importance of good medication taking behavior to lower risk of another stroke or seizure Reminded patient's wife to take patient's blood pressure daily and call clinic for consistent readings > 140/90 Reviewed the blood pressure treatment targets and explained why it is important to record pulse if BP readings are low Requested patient's wife keep this CCM RN updated on status of Medicaid and long term disability appeal Advised patient, providing education and rationale, to monitor blood pressure daily and record, calling PCP for findings outside established parameters.   Provided patient with one case of Vanilla Ensure Reviewed scheduled/upcoming provider appointments including: to establish with neurologist on 01/18/21 Advised patient and wife that as of mid July the role of CCM RN CM will transition from this RNCM to [name] and introduced Ms Charissa Bash to patient and his wife Patient Goals/Self-Care Activities Over the next 30-60 days, patient will:  - Self administers medications as prescribed Attends all scheduled provider appointments Calls provider office for new concerns, questions, or BP outside discussed parameters Checks BP and records as discussed Follows a low sodium diet/DASH diet Follow Up Plan: The care management team will reach out to the patient again over the next 30-60 days.      Care Plan : Stroke (Adult)  Updates made by [name], RN since 11/23/2020 12:00 AM     Problem: Residual Deficits (Stroke)      Long-Range Goal: Residual Deficits Minimized/Symptom Recognition Improvement   Start Date: 11/03/2020  Expected End Date: 02/03/2021  Recent Progress: On track  Priority: High  Note:   Current Barriers:  Knowledge deficits related to post stroke care.   Clinical Goal(s):  Collaboration with Dewayne Hatch, MD regarding development and update of comprehensive plan of care as evidenced by provider attestation and co-signature Inter-disciplinary care team collaboration (see longitudinal plan of care) patient will work with care management team to address care coordination and  chronic disease management needs related to Care Coordination Medication Management and Education  -patient's wife continues to voice hardship related to medication copays and frustration that patient was denied both Medicaid and long term disability and they will appeal the denials. Interventions:  Evaluation of current treatment plan related to CAD and HTN, Financial constraints related to lack of health insurance and Medication procurement  self-management and patient's adherence to plan as established by provider. Collaboration with Dewayne Hatch, MD regarding development and update of comprehensive plan of care as evidenced by provider attestation and co-signature Inter-disciplinary care team collaboration (see longitudinal plan of care)- will fax patient assistance application for Vimpat to neurologist office for completion and signature of provider Encouraged patient to stop smoking and drinking Discussed plans with patient for ongoing care management follow up and provided patient with direct contact information for care management team- [place] to patient and spouse and advised she will assume role of CCM RNCM in mid July Clinic BSW is working with patient to address needed resources Self Care Activities:  Self administers medications as prescribed Attends all scheduled provider appointments Calls provider office for new concerns or questions Patient Goals: Take medications as directed Keep all provider appointments Follow low sodium diet Monitor BP daily and report consistent readings of >140/>90 to clinic provider  Stop or limit smoking and etoh use   Follow Up Plan:   Care management team will follow up with patient in 30 days     Plan: The care management team will reach out to the patient again over the next 30 days.  Kelli Churn RN, CCM, [facility] RN Care Manager [phone]

## 2020-11-22 NOTE — Assessment & Plan Note (Signed)
Admitted in May for ETOH withdrawal seizure. Encephalopathy is resolved today.

## 2020-11-22 NOTE — Patient Instructions (Signed)
It was nice meeting you today. We will hold off on rechecking labs today however we will need to check these at some point soon because your platelets and hemoglobin were low and will require further workup.  I commend you for abstaining from alcohol use. Please let us know if you develop cravings, as we may be able to offer medication to help with that.  The most likely cause of your seizure was from alcohol withdrawal. The seizure medication needs to be monitored so this needs to be done through the neurology office. Continue to plan to follow up with them in August.  You were also found to have diabetes in the hospital. You are taking metformin for this. Continue taking this. Return in August so we can recheck your A1C. Try to limit foods high in sugar.

## 2020-11-22 NOTE — Assessment & Plan Note (Signed)
Diagnosed on admission in May at which time A1C was 6.8. Discharged on 500mg  metformin BID. Denies adverse medication effects.  Reviewed diagnosis, monitoring, treatment, and lifestyle changes with him and his wife.  Will need retinopathy screening at some point however uninsured at this time so will need referral upon obtaining insurance. Foot exam deferred at today's visit due to multiple other issues.  Plan -f/u in August for diabetes follow up visit -foot exam will need to be done at that time -refer to ophthalmology upon obtaining insurance -continue metformin 500mg  BID for now

## 2020-11-22 NOTE — Assessment & Plan Note (Signed)
Blood pressure is at goal in the office today--127/89 -continue amlodipine 10mg  and clonidine 0.1mg  BID

## 2020-11-22 NOTE — Assessment & Plan Note (Addendum)
Recently admitted in May for ETOH withdrawal seizure. Head imaging showed possible subacute infarct vs seizure sequale in the hippocampus.  No new neurodeficits at today's visit.  Antiplatelet agent: aspirin 81mg  Previously on lipitor however is not on their med list today. Suspect it was stopped due to elevated transaminases this last admission.  -resume high intensity statin upon resolution of transaminitis Continue to work on blood pressure, lipid and glycemic control.

## 2020-11-23 NOTE — Patient Instructions (Signed)
Visit Information It was nice meeting with you and your family today.   Goals Addressed             This Visit's Progress    Secure long term medication copay assistance       Timeframe:  Long-Range Goal Priority:  High Start Date:      05/24/20                       Expected End Date:     02/24/21                  Follow Up Date 12/24/20   - call for medicine refill 2 or 3 days before it runs out - call if I am sick and can't take my medicine  - notify clinic pharmacist for medication copay assistance at least one week prior to needing refills   Why is this important?   These steps will help you keep on track with your medicines.  Note: 11/22/20- patient has completed patient assistance program application for seizure medication         The patient verbalized understanding of instructions, educational materials, and care plan provided today and declined offer to receive copy of patient instructions, educational materials, and care plan.   The care management team will reach out to the patient again over the next 30 days.   Cranford Mon RN, CCM, [facility] CCM Clinic RN Care Manager [phone]

## 2020-11-24 NOTE — Progress Notes (Signed)
Internal Medicine Clinic Attending  Case discussed with Dr. Christian  At the time of the visit.  We reviewed the resident's history and exam and pertinent patient test results.  I agree with the assessment, diagnosis, and plan of care documented in the resident's note.  

## 2020-11-29 ENCOUNTER — Ambulatory Visit: Payer: Self-pay | Admitting: *Deleted

## 2020-11-29 DIAGNOSIS — F1020 Alcohol dependence, uncomplicated: Secondary | ICD-10-CM

## 2020-11-29 DIAGNOSIS — Z8673 Personal history of transient ischemic attack (TIA), and cerebral infarction without residual deficits: Secondary | ICD-10-CM

## 2020-11-29 DIAGNOSIS — I10 Essential (primary) hypertension: Secondary | ICD-10-CM

## 2020-11-29 DIAGNOSIS — Z87898 Personal history of other specified conditions: Secondary | ICD-10-CM

## 2020-11-29 NOTE — Chronic Care Management (AMB) (Signed)
Care Management    RN Visit Note  11/29/2020 Name: TREYVONE CHELF MRN: [id number] DOB: [birth year]  Subjective: Arthur [name] is a 60 y.o. year old male who is a primary care patient of [name], MD. The care management team was consulted for assistance with disease management and care coordination needs.    Consent to Services:   Mr. Shieh was given information about Care Management services today including:  Care Management services includes personalized support from designated clinical staff supervised by his physician, including individualized plan of care and coordination with other care providers 24/7 contact phone numbers for assistance for urgent and routine care needs. The patient may stop case management services at any time by phone call to the office staff.  Patient agreed to services and consent obtained.   Assessment: Review of patient past medical history, allergies, medications, health status, including review of consultants reports, laboratory and other test data, was performed as part of comprehensive evaluation and provision of chronic care management services.   SDOH (Social Determinants of Health) assessments and interventions performed:    Care Plan  Allergies  Allergen Reactions   Penicillins Hives    Did it involve swelling of the face/tongue/throat, SOB, or low BP? Y Did it involve sudden or severe rash/hives, skin peeling, or any reaction on the inside of your mouth or nose? N Did you need to seek medical attention at a hospital or doctor's office? Y When did it last happen?  Over 5 Years Ago     If all above answers are "NO", may proceed with cephalosporin use.     Outpatient Encounter Medications as of 11/29/2020  Medication Sig   amLODipine (NORVASC) 10 MG tablet Take 1 tablet (10 mg total) by mouth daily.   aspirin EC 81 MG tablet Take 81 mg by mouth daily. Swallow whole.   cloNIDine (CATAPRES) 0.1 MG tablet Take 1 tablet (0.1 mg total) by mouth 2  (two) times daily.   lacosamide (VIMPAT) 50 MG TABS tablet Take 1 tablet (50 mg total) by mouth 2 (two) times daily.   levETIRAcetam (KEPPRA) 500 MG tablet Take 1 tablet (500 mg total) by mouth 2 (two) times daily.   metFORMIN (GLUCOPHAGE) 500 MG tablet Take 1 tablet (500 mg total) by mouth 2 (two) times daily with a meal. IM PROGRAM   Multiple Vitamin (MULTIVITAMIN WITH MINERALS) TABS tablet Take 1 tablet by mouth daily.   No facility-administered encounter medications on file as of 11/29/2020.    Patient Active Problem List   Diagnosis Date Noted   Aortic atherosclerosis (HCC) 11/22/2020   History of seizures 11/22/2020   Alcohol use disorder, severe, dependence (HCC) 11/22/2020   Type 2 diabetes mellitus (HCC) 11/22/2020   Anemia 11/22/2020   Thrombocytopenia (HCC) 11/22/2020   Hyperlipidemia 11/22/2020   Transaminitis    Essential hypertension 10/08/2019   History of CVA (cerebrovascular accident) 10/02/2019    Conditions to be addressed/monitored: HTN,  s/p stroke, seizure disorder dxed 6/22, etoh use, tobacco use  Care Plan : Stroke (Adult)  Updates made by Bary Richard, RN since 11/29/2020 12:00 AM     Problem: Residual Deficits (Stroke)      Long-Range Goal: Residual Deficits Minimized/Symptom Recognition Improvement   Start Date: 11/03/2020  Expected End Date: 02/03/2021  Recent Progress: On track  Priority: High  Note:   Current Barriers:  Knowledge deficits related to post stroke care.   Clinical Goal(s):  Collaboration with Chevis Pretty, MD regarding development  and update of comprehensive plan of care as evidenced by provider attestation and co-signature Inter-disciplinary care team collaboration (see longitudinal plan of care) patient will work with care management team to address care coordination and chronic disease management needs related to Care Coordination Medication Management and Education  -patient's wife continues to voice hardship related to  medication copays and frustration that patient was denied both Medicaid and long term disability and they will appeal the denials. Interventions:  Evaluation of current treatment plan related to CAD and HTN, Financial constraints related to lack of health insurance and Medication procurement self-management and patient's adherence to plan as established by provider. Collaboration with Chevis Pretty, MD regarding development and update of comprehensive plan of care as evidenced by provider attestation and co-signature Inter-disciplinary care team collaboration (see longitudinal plan of care)- will fax patient assistance application for Vimpat to neurologist office for completion and signature of provider 11/29/20- Vimpat patient assistance program application form successfully faxed to [facility] at fax # [phone] for provider completion Encouraged patient to stop smoking and drinking Discussed plans with patient for ongoing care management follow up and provided patient with direct contact information for care management team- Introduced Jodelle Gross CCM RNCM to patient and spouse and advised she will assume role of CCM RNCM in mid July Clinic BSW is working with patient to address needed resources Self Care Activities:  Self administers medications as prescribed Attends all scheduled provider appointments Calls provider office for new concerns or questions Patient Goals: Take medications as directed Keep all provider appointments Follow low sodium diet Monitor BP daily and report consistent readings of >140/>90 to clinic provider  Stop or limit smoking and etoh use   Follow Up Plan:   Care management team will follow up with patient in 30 days     Plan: The care management team will reach out to the patient again over the next 30 days.  Cranford Mon RN, CCM, CDCES CCM Clinic RN Care Manager [phone]

## 2020-12-05 ENCOUNTER — Telehealth: Payer: Self-pay | Admitting: *Deleted

## 2020-12-05 DIAGNOSIS — Z87898 Personal history of other specified conditions: Secondary | ICD-10-CM

## 2020-12-05 MED ORDER — LACOSAMIDE 50 MG PO TABS: 50.0000 mg | ORAL_TABLET | Freq: Two times a day (BID) | ORAL | 11 refills | Status: DC

## 2020-12-05 NOTE — Telephone Encounter (Signed)
Received fax from [facility] re: Vimpat PAP to be signed by neurologist, not PCP. Patient is scheduled to see Dr Harrison Mons 01/18/21 as new patient. Dr Marjory Lies reviewed hospital and FU notes, signed Vimpat PAP form. Vimpat Rx printed for MD review, signature.

## 2020-12-05 NOTE — Telephone Encounter (Signed)
Vimpat Rx and PAP application signed, faxed to [employer], received confirmation. Called patient and informed him vimpat PAP application has been faxed; he will probably hear from [employer] about his eligibility in a few days. He stated he still has medicine to take, verbalized understanding, appreciation.

## 2020-12-06 ENCOUNTER — Telehealth: Payer: Self-pay

## 2020-12-08 NOTE — Telephone Encounter (Signed)
Called [employer] to check status of application, spoke with Lucendia Herrlich who stated patient approved on 12/07/20 until 12/08/2022 unless insurance changes. They have not reached out to him yet but will reach out in 3 business days, patient can call or I can speak with pharmacist. I requested to speak with pharmacist, call transferred. Spoke with Herbert Seta who stated they require a signature, so she'll call him to schedule a shipment. If no answer, she'll LVM and also FU in 48 hours.

## 2020-12-14 ENCOUNTER — Telehealth: Payer: Self-pay | Admitting: Licensed Clinical Social Worker

## 2020-12-14 NOTE — Telephone Encounter (Signed)
  Care Management   Follow Up Note   12/14/2020 Name: Arthur [name] MRN: [id number] DOB: 07-20-1960   Referred by: [name], MD Reason for referral : No chief complaint on file.   An unsuccessful telephone outreach was attempted today. The patient was referred to the case management team for assistance with care management and care coordination.   Follow Up Plan: The care management team will reach out to the patient again over the next 7 days.   Christen Butter, BSW  Social Worker [facility] Care Management  [phone]

## 2020-12-19 ENCOUNTER — Other Ambulatory Visit: Payer: Self-pay | Admitting: Internal Medicine

## 2020-12-19 ENCOUNTER — Other Ambulatory Visit: Payer: Self-pay

## 2020-12-19 MED ORDER — METFORMIN HCL 500 MG PO TABS
500.0000 mg | ORAL_TABLET | Freq: Two times a day (BID) | ORAL | 0 refills | Status: DC
  Filled 2020-12-19: qty 60, 30d supply, fill #0

## 2020-12-19 MED ORDER — CLONIDINE HCL 0.1 MG PO TABS
0.1000 mg | ORAL_TABLET | Freq: Two times a day (BID) | ORAL | 0 refills | Status: DC
  Filled 2020-12-19: qty 60, 30d supply, fill #0

## 2020-12-20 ENCOUNTER — Other Ambulatory Visit: Payer: Self-pay

## 2020-12-21 ENCOUNTER — Other Ambulatory Visit: Payer: Self-pay

## 2020-12-28 ENCOUNTER — Ambulatory Visit: Payer: Self-pay

## 2020-12-28 NOTE — Chronic Care Management (AMB) (Signed)
Care Management    RN Visit Note  12/28/2020 Name: SHEM PLEMMONS MRN: [id number] DOB: 1960/10/25  Subjective: DAMARI SUASTEGUI is a 60 y.o. year old male who is a primary care patient of [name], MD. The care management team was consulted for assistance with disease management and care coordination needs.    Engaged with patient by telephone for follow up visit in response to provider referral for case management and/or care coordination services.   Consent to Services:   Mr. Coomes was given information about Care Management services today including:  Care Management services includes personalized support from designated clinical staff supervised by his physician, including individualized plan of care and coordination with other care providers 24/7 contact phone numbers for assistance for urgent and routine care needs. The patient may stop case management services at any time by phone call to the office staff.  Patient agreed to services and consent obtained.    Assessment: Patient is making progress with obtaining financial assistance for Vimpat . See Care Plan below for interventions and patient self-care actives. Follow up Plan: Patient would like continued follow-up.  CCM RNCM will outreach the patient within the next 30 days.  Patient will call office if needed prior to next encounter : Review of patient past medical history, allergies, medications, health status, including review of consultants reports, laboratory and other test data, was performed as part of comprehensive evaluation and provision of chronic care management services.   SDOH (Social Determinants of Health) assessments and interventions performed:    Care Plan  Allergies  Allergen Reactions   Penicillins Hives    Did it involve swelling of the face/tongue/throat, SOB, or low BP? Y Did it involve sudden or severe rash/hives, skin peeling, or any reaction on the inside of your mouth or nose? N Did you need to seek  medical attention at a hospital or doctor's office? Y When did it last happen?  Over 5 Years Ago     If all above answers are "NO", may proceed with cephalosporin use.     Outpatient Encounter Medications as of 12/28/2020  Medication Sig Note   amLODipine (NORVASC) 10 MG tablet Take 1 tablet (10 mg total) by mouth daily.    aspirin EC 81 MG tablet Take 81 mg by mouth daily. Swallow whole.    cloNIDine (CATAPRES) 0.1 MG tablet Take 1 tablet (0.1 mg total) by mouth 2 (two) times daily.    lacosamide (VIMPAT) 50 MG TABS tablet Take 1 tablet (50 mg total) by mouth 2 (two) times daily. 12/08/2020: 12/08/20 receiving PAP through [employer], approved until 12/08/2022   levETIRAcetam (KEPPRA) 500 MG tablet Take 1 tablet (500 mg total) by mouth 2 (two) times daily.    metFORMIN (GLUCOPHAGE) 500 MG tablet Take 1 tablet (500 mg total) by mouth 2 (two) times daily with a meal. IM PROGRAM    Multiple Vitamin (MULTIVITAMIN WITH MINERALS) TABS tablet Take 1 tablet by mouth daily.    No facility-administered encounter medications on file as of 12/28/2020.    Patient Active Problem List   Diagnosis Date Noted   Aortic atherosclerosis ([place]) 11/22/2020   History of seizures 11/22/2020   Alcohol use disorder, severe, dependence ([place]) 11/22/2020   Type 2 diabetes mellitus ([place]) 11/22/2020   Anemia 11/22/2020   Thrombocytopenia ([place]) 11/22/2020   Hyperlipidemia 11/22/2020   Transaminitis    Essential hypertension 10/08/2019   History of CVA (cerebrovascular accident) 10/02/2019    Conditions to be addressed/monitored:  CAD, HTN, and Seizure disorder  Care Plan : Hypertension (Adult)  Updates made by [name], RN since 12/28/2020 12:00 AM     Problem: Disease Progression (Hypertension)   Priority: High  Onset Date: 05/24/2020     Goal: Disease Progression Prevented or Minimized   Start Date: 05/24/2020  Recent Progress: On track  Priority: High  Note:   Objective:  Last practice recorded BP  readings:  BP Readings from Last 3 Encounters:  11/22/20 127/89  10/25/20 (!) 136/97  08/14/20 (!) 149/91   Most recent eGFR/CrCl: No results found for: EGFR  No components found for: CRCL Current Barriers:  Difficulty obtaining medications-  Unable to afford medication copay-Successful call to patient and his wife [name].  Spoke mainly with  [place] regarding patient status.  Per [name], patient is doing very well physically, she notes he is gaining weight and eating well. Patient continues to have difficulty affording to pay medication copays as she states they have little to no income, she says both long term disability and Medicaid were denied and they have appealed both decisions.  The SSI appeal is suppose to take up to 60 days and they are not sure about the Medicaid appeal.  Discussed whether the family is having any issues with food insecurity and [name] shared they did not, it was mostly having money for copays.  Patient was approved for Vimpat through [facility] assistance and they have received 6 months worth of medicine. Patient is approved for this assistance from 12/07/20-12/08/22 pending any changes with income. Case Manager Clinical Goal(s):  Over the next 30-60 days, patient will verbalize understanding of plan for hypertension management Interventions:  Evaluation of current treatment plan related to hypertension self management and patient's adherence to plan as established by provider. Discussed importance of good medication taking behavior to lower risk of another stroke or seizure- Request to be sent to MD per [name] that patient needs refill for his Metformin and Clonidine as she had them filled recently and both have no refills on the prescriptions.  Per notes, patient had a refill on 12/21/20 for 60 days, will request refills after follow up on 01/26/21. Reviewed the blood pressure treatment targets and explained why it is important to record pulse if BP readings are low Requested  patient's wife keep this CCM RN updated on status of Medicaid and long term disability appeal Advised patient, providing education and rationale, to monitor blood pressure daily and record, calling PCP for findings outside established parameters.  Reviewed scheduled/upcoming provider appointments including: to establish with neurologist on 01/18/21. Patient Goals/Self-Care Activities Over the next 30-60 days, patient will:  - Self administers medications as prescribed Attends all scheduled provider appointments Calls provider office for new concerns, questions, or BP outside discussed parameters Checks BP and records as discussed Follows a low sodium diet/DASH diet Follow Up Plan: The care management team will reach out to the patient again over the next 30-60 days.      Care Plan : Stroke (Adult)  Updates made by [name], RN since 12/28/2020 12:00 AM     Problem: Residual Deficits (Stroke)      Long-Range Goal: Residual Deficits Minimized/Symptom Recognition Improvement   Start Date: 11/03/2020  Recent Progress: On track  Priority: High  Note:   Current Barriers:  Knowledge deficits related to post stroke care.   Clinical Goal(s):  Collaboration with Dewayne Hatch, MD regarding development and update of comprehensive plan of care as evidenced by provider attestation and co-signature  Inter-disciplinary care team collaboration (see longitudinal plan of care) patient will work with care management team to address care coordination and chronic disease management needs related to Care Coordination Medication Management and Education  - Patient has appealed both denials for SSI and Medicaid. Interventions:  Evaluation of current treatment plan related to CAD and HTN, Financial constraints related to lack of health insurance and Medication procurement self-management and patient's adherence to plan as established by provider. Collaboration with Dewayne Hatch, MD  regarding development and update of comprehensive plan of care as evidenced by provider attestation and co-signature Inter-disciplinary care team collaboration (see longitudinal plan of care)- will fax patient assistance application for Vimpat to neurologist office for completion and signature of provider Encouraged patient to stop smoking (1-2 cigars per day), patient is not drinking ETOH. Clinic BSW is working with patient to address needed resources Self Care Activities:  Self administers medications as prescribed Attends all scheduled provider appointments Calls provider office for new concerns or questions Patient Goals: Take medications as directed Keep all provider appointments Follow low sodium diet Monitor BP daily and report consistent readings of >140/>90 to clinic provider  Stop or limit smoking and etoh use   Follow Up Plan:   Care management team will follow up with patient in 30 days     Plan: Telephone follow up appointment with care management team member scheduled for:  44 days  [name], RN, BSN, CCM Care Management Coordinator [facility] Phone: [phone] / Fax: [phone]

## 2020-12-28 NOTE — Patient Instructions (Signed)
Visit Information   Goals Addressed             This Visit's Progress    Secure long term medication copay assistance       Timeframe:  Long-Range Goal Priority:  High Start Date:      05/24/20                       Expected End Date:     02/24/21                  Follow Up Date-02/24/2021   - call for medicine refill 2 or 3 days before it runs out - call if I am sick and can't take my medicine  - notify clinic pharmacist for medication copay assistance at least one week prior to needing refills   Why is this important?   These steps will help you keep on track with your medicines.  Note:        The patient verbalized understanding of instructions, educational materials, and care plan provided today and declined offer to receive copy of patient instructions, educational materials, and care plan.   Telephone follow up appointment with care management team member scheduled for: 01/26/21@11 :30  Jodelle Gross, RN, BSN, CCM Care Management Coordinator [facility] Phone: [phone] / Fax: [phone]

## 2021-01-18 ENCOUNTER — Other Ambulatory Visit: Payer: Self-pay

## 2021-01-18 ENCOUNTER — Encounter: Payer: Self-pay | Admitting: Diagnostic Neuroimaging

## 2021-01-18 ENCOUNTER — Ambulatory Visit: Payer: Self-pay | Admitting: Diagnostic Neuroimaging

## 2021-01-18 DIAGNOSIS — Z8673 Personal history of transient ischemic attack (TIA), and cerebral infarction without residual deficits: Secondary | ICD-10-CM

## 2021-01-18 DIAGNOSIS — Z87898 Personal history of other specified conditions: Secondary | ICD-10-CM

## 2021-01-18 DIAGNOSIS — R569 Unspecified convulsions: Secondary | ICD-10-CM

## 2021-01-18 MED ORDER — LEVETIRACETAM 500 MG PO TABS
500.0000 mg | ORAL_TABLET | Freq: Two times a day (BID) | ORAL | 12 refills | Status: DC
  Filled 2021-01-18 – 2021-02-01 (×3): qty 60, 30d supply, fill #0

## 2021-01-18 NOTE — Patient Instructions (Addendum)
  SEIZURE DISORDER (last seizure 10/16/20; post-stroke 2021; h/o alcohol abuse) - continue levetiracetam 500mg  twice a day   - continue vimpat 50mg  twice a day (through [employer] patient assistance); may reduce to monotherapy levetiracetam in future  - medicaid pending (then follow up labs through PCP)  - avoid alcohol  - According to [place] law, you can not drive unless you are seizure / syncope free for at least 6 months and under physician's care.   - Please maintain precautions. Do not participate in activities where a loss of awareness could harm you or someone else. No swimming alone, no tub bathing, no hot tubs, no driving, no operating motorized vehicles (cars, ATVs, motocycles, etc), lawnmowers, power tools or firearms. No standing at heights, such as rooftops, ladders or stairs. Avoid hot objects such as stoves, heaters, open fires. Wear a helmet when riding a bicycle, scooter, skateboard, etc. and avoid areas of traffic. Set your water heater to 120 degrees or less.

## 2021-01-18 NOTE — Progress Notes (Signed)
[facility]  PATIENT: Arthur [name] DOB: April 16, 1961  REFERRING CLINICIAN: [name], Elhamalsadat, * HISTORY FROM: Patient and wife REASON FOR VISIT: new consult    HISTORICAL  CHIEF COMPLAINT:  Chief Complaint  Patient presents with   New Patient (Initial Visit)    Rm 7 with wife here for evaluation on Seizure/Acute Encephalopathy. Reports he is here to discuss seizure activity. Reports seizures took place after stroke. Report over all he has been feeling well. Reports he is Vimpat and Keppra for seizure management, reports he has tolerated these meds well.     HISTORY OF PRESENT ILLNESS:   60 year old male here for evaluation of seizure/encephalopathy.  History of alcohol abuse and prior left temporoparietal stroke.  Patient presented to the hospital for new onset of facial droop and slurred speech.  Code stroke was activated.  IV tPA was started and patient began to have shivering and blood in his mouth.  Seizure was suspected.  tPA was stopped and patient was given Ativan 4 mg for possible seizure versus alcohol withdrawal.  Continuous EEG was performed.  Electrographic seizures were noted averaging 3 to 4/h lasting 20 seconds.  These seemed to arise from left frontotemporal region.  Patient was started on antiseizure medication.  He was stabilized and discharged on levetiracetam 5 mg twice a day and Vimpat 50 mg twice a day.  Since discharge patient is doing well and no further events.  Tolerating medications.   REVIEW OF SYSTEMS: Full 14 system review of systems performed and negative with exception of: as per HPI.  ALLERGIES: Allergies  Allergen Reactions   Penicillins Hives    Did it involve swelling of the face/tongue/throat, SOB, or low BP? Y Did it involve sudden or severe rash/hives, skin peeling, or any reaction on the inside of your mouth or nose? N Did you need to seek medical attention at a hospital or doctor's office? Y When did it last happen?   Over 5 Years Ago     If all above answers are "NO", may proceed with cephalosporin use.     HOME MEDICATIONS: Outpatient Medications Prior to Visit  Medication Sig Dispense Refill   amLODipine (NORVASC) 10 MG tablet Take 1 tablet (10 mg total) by mouth daily. 30 tablet 0   aspirin EC 81 MG tablet Take 81 mg by mouth daily. Swallow whole.     cloNIDine (CATAPRES) 0.1 MG tablet Take 1 tablet (0.1 mg total) by mouth 2 (two) times daily. 60 tablet 0   lacosamide (VIMPAT) 50 MG TABS tablet Take 1 tablet (50 mg total) by mouth 2 (two) times daily. 60 tablet 11   metFORMIN (GLUCOPHAGE) 500 MG tablet Take 1 tablet (500 mg total) by mouth 2 (two) times daily with a meal. IM PROGRAM 60 tablet 0   Multiple Vitamin (MULTIVITAMIN WITH MINERALS) TABS tablet Take 1 tablet by mouth daily.     levETIRAcetam (KEPPRA) 500 MG tablet Take 1 tablet (500 mg total) by mouth 2 (two) times daily. 60 tablet 2   No facility-administered medications prior to visit.    PAST MEDICAL HISTORY: Past Medical History:  Diagnosis Date   ETOH abuse    Hypertension     PAST SURGICAL HISTORY: Past Surgical History:  Procedure Laterality Date   HEMORRHOID SURGERY      FAMILY HISTORY: Family History  Problem Relation Age of Onset   Diabetes Mother    Hypertension Mother    Diabetes Father    Hypertension Father  Hypertension Sister     SOCIAL HISTORY: Social History   Socioeconomic History   Marital status: Married    Spouse name: Not on file   Number of children: Not on file   Years of education: Not on file   Highest education level: Not on file  Occupational History   Not on file  Tobacco Use   Smoking status: Every Day    Types: Cigars   Smokeless tobacco: Never   Tobacco comments:    Smokes black& mild - 1 a day.  Vaping Use   Vaping Use: Never used  Substance and Sexual Activity   Alcohol use: Not Currently   Drug use: Never   Sexual activity: Not on file  Other Topics Concern   Not  on file  Social History Narrative   Right handed   Lives at home with wife    2-3 cups of cadd per day    Social Determinants of Health   Financial Resource Strain: Medium Risk   Difficulty of Paying Living Expenses: Somewhat hard  Food Insecurity: No Food Insecurity   Worried About Programme researcher, broadcasting/film/video in the Last Year: Never true   Ran Out of Food in the Last Year: Never true  Transportation Needs: No Transportation Needs   Lack of Transportation (Medical): No   Lack of Transportation (Non-Medical): No  Physical Activity: Not on file  Stress: Not on file  Social Connections: Not on file  Intimate Partner Violence: Not on file     PHYSICAL EXAM  GENERAL EXAM/CONSTITUTIONAL: Vitals:  Vitals:   01/18/21 1020  BP: (!) 167/99  Pulse: 91  Weight: 151 lb (68.5 kg)  Height: 5\' 3"  (1.6 m)   Body mass index is 26.75 kg/m. Wt Readings from Last 3 Encounters:  01/18/21 151 lb (68.5 kg)  11/22/20 138 lb 4.8 oz (62.7 kg)  10/25/20 137 lb 9.1 oz (62.4 kg)   Patient is in no distress; well developed, nourished and groomed; neck is supple  CARDIOVASCULAR: Examination of carotid arteries is normal; no carotid bruits Regular rate and rhythm, no murmurs Examination of peripheral vascular system by observation and palpation is normal  EYES: Ophthalmoscopic exam of optic discs and posterior segments is normal; no papilledema or hemorrhages No results found.  MUSCULOSKELETAL: Gait, strength, tone, movements noted in Neurologic exam below  NEUROLOGIC: MENTAL STATUS:  No flowsheet data found. awake, alert, oriented to person, place and time recent and remote memory intact normal attention and concentration DECR FLUENCY; comprehension intact, naming intact fund of knowledge appropriate  CRANIAL NERVE:  2nd - no papilledema on fundoscopic exam 2nd, 3rd, 4th, 6th - pupils equal and reactive to light, visual fields full to confrontation, extraocular muscles intact, no  nystagmus 5th - facial sensation symmetric 7th - facial strength symmetric 8th - hearing intact 9th - palate elevates symmetrically, uvula midline 11th - shoulder shrug symmetric 12th - tongue protrusion midline  MOTOR:  normal bulk and tone, full strength in the BUE, BLE  SENSORY:  normal and symmetric to light touch, temperature, vibration  COORDINATION:  finger-nose-finger, fine finger movements normal  REFLEXES:  deep tendon reflexes present and symmetric  GAIT/STATION:  narrow based gait     DIAGNOSTIC DATA (LABS, IMAGING, TESTING) - I reviewed patient records, labs, notes, testing and imaging myself where available.  Lab Results  Component Value Date   WBC 9.1 10/24/2020   HGB 8.0 (L) 10/24/2020   HCT 24.4 (L) 10/24/2020   MCV 81.9 10/24/2020  PLT 143 (L) 10/24/2020      Component Value Date/Time   NA 137 10/24/2020 0103   NA 138 01/11/2020 0959   K 3.7 10/24/2020 0103   CL 108 10/24/2020 0103   CO2 23 10/24/2020 0103   GLUCOSE 95 10/24/2020 0103   BUN 16 10/24/2020 0103   BUN 14 01/11/2020 0959   CREATININE 1.42 (H) 10/24/2020 0103   CALCIUM 8.2 (L) 10/24/2020 0103   PROT 6.0 (L) 10/24/2020 0103   ALBUMIN 2.3 (L) 10/24/2020 0103   AST 176 (H) 10/24/2020 0103   ALT 105 (H) 10/24/2020 0103   ALKPHOS 419 (H) 10/24/2020 0103   BILITOT 0.8 10/24/2020 0103   GFRNONAA 58 (L) 10/24/2020 0103   GFRAA 95 01/11/2020 0959   Lab Results  Component Value Date   CHOL 217 (H) 10/17/2020   HDL <10 (L) 10/17/2020   LDLCALC UNABLE TO CALCULATE IF TRIGLYCERIDE OVER 400 mg/dL 74/04/8785   LDLDIRECT 124.3 (H) 10/17/2020   TRIG 485 (H) 10/18/2020   CHOLHDL NOT CALCULATED 10/17/2020   Lab Results  Component Value Date   HGBA1C 6.8 (H) 10/16/2020   No results found for: VITAMINB12 No results found for: TSH  10/17/2020 MRI brain with and without contrast - Abnormal diffusion signal involving the left hippocampus. Primary differential considerations are seizure  effect and subacute infarct. - Multiple chronic infarcts.  10/16/2020 EEG - This study is suggestive of severe diffuse encephalopathy, nonspecific etiology but likely related to sedation.  No seizures or epileptiform discharges were seen during the study  10/17/2020 cEEG - This study showed seizures without clinical signs arising from left frontotemporal region, average 3-4/hour, duration about 20 seconds, last seizure at 0908 on 10/17/2020.  Additionally there is evidence of cortical dysfunction and epileptogenicity arising from left frontotemporal region due to underlying stroke.  There is also severe diffuse encephalopathy, nonspecific etiology but likely related to sedation.   10/18/20 cEEG - This study showed evidence of cortical dysfunction and epileptogenicity arising from left frontotemporal region due to underlying stroke.  There is also severe diffuse encephalopathy, nonspecific etiology but likely related to sedation.  No seizures were seen during the study.   ASSESSMENT AND PLAN  60 y.o. year old male here with:  Dx:  1. History of seizures    PLAN:  SEIZURE DISORDER (last seizure 10/16/20; post-stroke 2021; h/o alcohol abuse) - continue levetiracetam 500mg  twice a day   - continue vimpat 50mg  twice a day (through [employer] patient assistance); may reduce to monotherapy levetiracetam in future  - medicaid pending (then follow up labs through PCP)  - avoid alcohol  - According to [place] law, you can not drive unless you are seizure / syncope free for at least 6 months and under physician's care.   - Please maintain precautions. Do not participate in activities where a loss of awareness could harm you or someone else. No swimming alone, no tub bathing, no hot tubs, no driving, no operating motorized vehicles (cars, ATVs, motocycles, etc), lawnmowers, power tools or firearms. No standing at heights, such as rooftops, ladders or stairs. Avoid hot objects such as stoves, heaters, open  fires. Wear a helmet when riding a bicycle, scooter, skateboard, etc. and avoid areas of traffic. Set your water heater to 120 degrees or less.   Meds ordered this encounter  Medications   levETIRAcetam (KEPPRA) 500 MG tablet    Sig: Take 1 tablet (500 mg total) by mouth 2 (two) times daily.    Dispense:  60 tablet  Refill:  12   Return in about 1 year (around 01/18/2022).    Suanne Marker, MD 01/18/2021, 11:05 AM Certified in Neurology, Neurophysiology and Neuroimaging  [facility] [address], [address] [place], [place] [address] [phone]

## 2021-01-19 ENCOUNTER — Ambulatory Visit: Payer: Self-pay | Admitting: Licensed Clinical Social Worker

## 2021-01-19 NOTE — Chronic Care Management (AMB) (Signed)
  Care Management   Social Work Visit Note  01/19/2021 Name: Arthur [name] MRN: [id number] DOB: Feb 21, 1961  Arthur [name] is a 60 y.o. year old male who sees [name], MD for primary care. The care management team was consulted for assistance with care management and care coordination needs related to [facility] Resources    Patient was given the following information about care management and care coordination services today, agreed to services, and gave verbal consent: 1.care management/care coordination services include personalized support from designated clinical staff supervised by their physician, including individualized plan of care and coordination with other care providers 2. 24/7 contact phone numbers for assistance for urgent and routine care needs. 3. The patient may stop care management/care coordination services at any time by phone call to the office staff.  Engaged with patient by telephone for follow up visit in response to provider referral for social work chronic care management and care coordination services.  Assessment: Review of patient history, allergies, and health status during evaluation of patient need for care management/care coordination services.    Interventions:  Patient interviewed and appropriate assessments performed Collaborated with clinical team regarding patient needs  SW spoke with patients spouse. Patient Medicaid is still pending. Patient has also begun the process to appeal his disability application.  Patient had questions regarding his medication and requested to speak with the pharmacist. SW sent in basket message to Cheral Almas, RPH-CPP requesting assistance.  Patient stated they have an appointment regarding Medicaid on 01/24/2021.      Plan:  patient will work with BSW to address needs related to Building surveyor will follow up in 30-days.   Christen Butter, BSW  Social Worker [facility] Care Management  [phone]

## 2021-01-19 NOTE — Patient Instructions (Signed)
Visit Information  Instructions: patient will work with SW to address concerns related to disability and medicaid applications.  Patient was given the following information about care management and care coordination services today, agreed to services, and gave verbal consent: 1.care management/care coordination services include personalized support from designated clinical staff supervised by their physician, including individualized plan of care and coordination with other care providers 2. 24/7 contact phone numbers for assistance for urgent and routine care needs. 3. The patient may stop care management/care coordination services at any time by phone call to the office staff.  Patient verbalizes understanding of instructions provided today and agrees to view in MyChart.    Christen Butter, BSW  Social Worker [facility] Care Management  [phone]

## 2021-01-25 ENCOUNTER — Other Ambulatory Visit: Payer: Self-pay

## 2021-01-26 ENCOUNTER — Other Ambulatory Visit: Payer: Self-pay

## 2021-01-26 ENCOUNTER — Ambulatory Visit: Payer: Self-pay

## 2021-01-26 NOTE — Chronic Care Management (AMB) (Signed)
Care Management    RN Visit Note  01/26/2021 Name: Arthur [name] MRN: [id number] DOB: Sep 29, 1960  Subjective: Arthur [name] is a 60 y.o. year old male who is a primary care patient of [name], MD. The care management team was consulted for assistance with disease management and care coordination needs.    Engaged with patient by telephone for follow up visit in response to provider referral for case management and/or care coordination services.   Consent to Services:   [name] was given information about Care Management services today including:  Care Management services includes personalized support from designated clinical staff supervised by his physician, including individualized plan of care and coordination with other care providers 24/7 contact phone numbers for assistance for urgent and routine care needs. The patient may stop case management services at any time by phone call to the office staff.  Patient agreed to services and consent obtained.   Assessment: Review of patient past medical history, allergies, medications, health status, including review of consultants reports, laboratory and other test data, was performed as part of comprehensive evaluation and provision of chronic care management services.   SDOH (Social Determinants of Health) assessments and interventions performed:    Care Plan  Allergies  Allergen Reactions   Penicillins Hives    Did it involve swelling of the face/tongue/throat, SOB, or low BP? Y Did it involve sudden or severe rash/hives, skin peeling, or any reaction on the inside of your mouth or nose? N Did you need to seek medical attention at a hospital or doctor's office? Y When did it last happen?  Over 5 Years Ago     If all above answers are "NO", may proceed with cephalosporin use.     Outpatient Encounter Medications as of 01/26/2021  Medication Sig Note   amLODipine (NORVASC) 10 MG tablet Take 1 tablet (10 mg total) by mouth daily.     aspirin EC 81 MG tablet Take 81 mg by mouth daily. Swallow whole.    cloNIDine (CATAPRES) 0.1 MG tablet Take 1 tablet (0.1 mg total) by mouth 2 (two) times daily.    lacosamide (VIMPAT) 50 MG TABS tablet Take 1 tablet (50 mg total) by mouth 2 (two) times daily. 12/08/2020: 12/08/20 receiving PAP through [employer], approved until 12/08/2022   levETIRAcetam (KEPPRA) 500 MG tablet Take 1 tablet (500 mg total) by mouth 2 (two) times daily.    metFORMIN (GLUCOPHAGE) 500 MG tablet Take 1 tablet (500 mg total) by mouth 2 (two) times daily with a meal. IM PROGRAM    Multiple Vitamin (MULTIVITAMIN WITH MINERALS) TABS tablet Take 1 tablet by mouth daily.    No facility-administered encounter medications on file as of 01/26/2021.    Patient Active Problem List   Diagnosis Date Noted   Aortic atherosclerosis (HCC) 11/22/2020   History of seizures 11/22/2020   Alcohol use disorder, severe, dependence (HCC) 11/22/2020   Type 2 diabetes mellitus (HCC) 11/22/2020   Anemia 11/22/2020   Thrombocytopenia (HCC) 11/22/2020   Hyperlipidemia 11/22/2020   Transaminitis    Essential hypertension 10/08/2019   History of CVA (cerebrovascular accident) 10/02/2019    Conditions to be addressed/monitored: CAD, HTN, and Seizure Disorder  Care Plan : Hypertension (Adult)  Updates made by Jodelle Gross, RN since 01/26/2021 12:00 AM     Problem: Disease Progression (Hypertension)   Priority: High  Onset Date: 05/24/2020     Goal: Disease Progression Prevented or Minimized   Start Date: 05/24/2020  Recent Progress: On track  Priority: High  Note:   Objective:  Last practice recorded BP readings:  BP Readings from Last 3 Encounters:  01/18/21 (!) 167/99  11/22/20 127/89  10/25/20 (!) 136/97   Most recent eGFR/CrCl: No results found for: EGFR  No components found for: CRCL Current Barriers:  Difficulty obtaining medications-  Unable to afford medication copay-Successful call to patient and his wife  [name].  Spoke mainly with  [name] regarding patient status.  Per [name], patient is doing very well physically, she notes he is gaining weight, his current weight up to 151 lbs.   Case Manager Clinical Goal(s):  Over the next 30-60 days, patient will verbalize understanding of plan for hypertension management Interventions:  Evaluation of current treatment plan related to hypertension self management and patient's adherence to plan as established by provider. Discussed importance of good medication taking behavior to lower risk of another stroke or seizure- Request to be sent to MD per [name] that patient needs refill for his Metformin and Clonidine as she had them filled recently and both have no refills on the prescription Reviewed the blood pressure treatment targets and explained why it is important to record pulse if BP readings are low Requested patient's wife keep this CCM RN updated on status of Medicaid and long term disability appeal Advised patient, providing education and rationale, to monitor blood pressure daily and record, calling PCP for findings outside established parameters.  Reviewed scheduled/upcoming provider appointments including: to establish with neurologist on 01/18/21. Patient Goals/Self-Care Activities Over the next 30-60 days, patient will:  - Self administers medications as prescribed Attends all scheduled provider appointments Calls provider office for new concerns, questions, or BP outside discussed parameters Checks BP and records as discussed Follows a low sodium diet/DASH diet Follow Up Plan: The care management team will reach out to the patient again over the next 30-60 days.      Care Plan : Stroke (Adult)  Updates made by [name], RN since 01/26/2021 12:00 AM     Problem: Residual Deficits (Stroke)      Long-Range Goal: Residual Deficits Minimized/Symptom Recognition Improvement   Start Date: 11/03/2020  Recent Progress: On track  Priority:  High  Note:   Current Barriers: Successful outreach to patient and spouse.  Patient went to neurologist on 01/18/21, Dr. Leta Baptist and he has put him on Keppra $RemoveB'500mg'uvzMPmno$ , 2x day.  Patient had this medication ordered for him but never started taking the medication.  In speaking with Mrs. Isbell, she noted that the neurologist told her husband that he could not drive a car unless he is seizure free for 6 months and cleared by a physician.  Patient is the only one in his family who drives so they are unable to pick up his Keppra.  This RNCM contacted the [employer] and they do have a program to mail prescriptions to patients.  Provided phone number for the pharmacy to spouse and encouraged her to call and set up delivery of medication. Knowledge deficits related to post stroke care.   Clinical Goal(s):  Collaboration with Dewayne Hatch, MD regarding development and update of comprehensive plan of care as evidenced by provider attestation and co-signature Inter-disciplinary care team collaboration (see longitudinal plan of care) patient will work with care management team to address care coordination and chronic disease management needs related to Care Coordination Medication Management and Education  - Patient has appealed both denials for SSI and Medicaid and had meeting about it.  Patient expects to hear by 02/07/21 per spouse. Interventions:  Evaluation of current treatment plan related to CAD and HTN, Financial constraints related to lack of health insurance and Medication procurement self-management and patient's adherence to plan as established by provider. Collaboration with Dewayne Hatch, MD regarding development and update of comprehensive plan of care as evidenced by provider attestation and co-signature Inter-disciplinary care team collaboration (see longitudinal plan of care)- will fax patient assistance application for Vimpat to neurologist office for completion and  signature of provider Encouraged patient to stop smoking (1-2 cigars per day), patient continues to abstain from ETOH. Clinic BSW is working with patient to address needed resources Self Care Activities:  Self administers medications as prescribed Attends all scheduled provider appointments Calls provider office for new concerns or questions Patient Goals: Take medications as directed Keep all provider appointments Follow low sodium diet Monitor BP daily and report consistent readings of >140/>90 to clinic provider  Stop or limit smoking and etoh use   Follow Up Plan:   Care management team will follow up with patient in 30 days     Plan: Telephone follow up appointment with care management team member scheduled for:  30 days.  [name], RN, BSN, CCM Care Management Coordinator [facility] Phone: [phone] / Fax: [phone]

## 2021-01-26 NOTE — Patient Outreach (Signed)
[facility]) Care Management  01/26/2021  Arthur [name] 1961/03/03 [id number]   Telephone outreach to patient to obtain mRS was successfully completed. MRS= 1   Arthur [name]

## 2021-01-26 NOTE — Patient Instructions (Signed)
Visit Information   Goals Addressed   Medication adherance     The patient verbalized understanding of instructions, educational materials, and care plan provided today and declined offer to receive copy of patient instructions, educational materials, and care plan.   Telephone follow up appointment with care management team member scheduled for: 03/02/21@1 :30  Jodelle Gross, RN, BSN, CCM Care Management Coordinator [facility] Phone: [phone] / Fax: [phone]

## 2021-01-27 ENCOUNTER — Other Ambulatory Visit: Payer: Self-pay

## 2021-01-31 ENCOUNTER — Other Ambulatory Visit: Payer: Self-pay

## 2021-02-01 ENCOUNTER — Other Ambulatory Visit: Payer: Self-pay

## 2021-02-02 ENCOUNTER — Other Ambulatory Visit: Payer: Self-pay

## 2021-02-03 ENCOUNTER — Other Ambulatory Visit: Payer: Self-pay

## 2021-02-23 ENCOUNTER — Ambulatory Visit: Payer: Self-pay | Admitting: Licensed Clinical Social Worker

## 2021-02-23 ENCOUNTER — Telehealth: Payer: Self-pay | Admitting: *Deleted

## 2021-02-23 NOTE — Patient Instructions (Signed)
Visit Information  Instructions:   Patient was given the following information about care management and care coordination services today, agreed to services, and gave verbal consent: 1.care management/care coordination services include personalized support from designated clinical staff supervised by their physician, including individualized plan of care and coordination with other care providers 2. 24/7 contact phone numbers for assistance for urgent and routine care needs. 3. The patient may stop care management/care coordination services at any time by phone call to the office staff.  Patient verbalizes understanding of instructions provided today and agrees to view in MyChart.   No further follow up required: Patient is approved for medicaid. No additional SW needs. SW removed from [place]. Patient understands to contact SW if needed.   Christen Butter, BSW  Social Worker [facility] Care Management  [phone]

## 2021-02-23 NOTE — Telephone Encounter (Signed)
[name] called in stating patient just came back from his father's funeral and his behavior is different. He is acting paranoid, calm, but "twitchy." Denies SI/HI. Denies alcohol or illegal substance use. She is advised to bring him to [facility] ED or [facility] on [address]. Explained if he is unwilling to go to please call the non-emergency police line and ask for the Behavioral Health Response Team or [facility]. Attempted to relay number [phone]), however, wife states patient is holding her hand and will not let her write it down. She denies being afraid. She understands she would need to file paper at the courthouse to have assistance bringing him to ED or [facility] against his wishes. She will try to convince him to be evaluated.

## 2021-02-23 NOTE — Chronic Care Management (AMB) (Signed)
  Care Management   Social Work Visit Note  02/23/2021 Name: Arthur [name] MRN: [id number] DOB: 02-02-1961  Arthur [name] is a 60 y.o. year old male who sees [name], MD for primary care. The care management team was consulted for assistance with care management and care coordination needs related to [facility] Resources    Patient was given the following information about care management and care coordination services today, agreed to services, and gave verbal consent: 1.care management/care coordination services include personalized support from designated clinical staff supervised by their physician, including individualized plan of care and coordination with other care providers 2. 24/7 contact phone numbers for assistance for urgent and routine care needs. 3. The patient may stop care management/care coordination services at any time by phone call to the office staff.  Engaged with patient by telephone for follow up visit in response to provider referral for social work chronic care management and care coordination services.  Assessment: Review of patient history, allergies, and health status during evaluation of patient need for care management/care coordination services.    Interventions:  Patient interviewed and appropriate assessments performed Collaborated with clinical team regarding patient needs  Patient is now approved for medicaid. Patient doesn't have any additional SW needs. SW informed patient to contact SW if needed. SW informed patient future follow ups will not occur by SW.  SDOH (Social Determinants of Health) assessments performed: Yes     Plan:  No further follow up required: SW removed from careteam.  Christen Butter, BSW  Social Worker [facility] Care Management  [phone]

## 2021-02-24 ENCOUNTER — Emergency Department (HOSPITAL_COMMUNITY): Payer: Medicaid Other

## 2021-02-24 ENCOUNTER — Inpatient Hospital Stay (HOSPITAL_COMMUNITY): Payer: Medicaid Other

## 2021-02-24 ENCOUNTER — Inpatient Hospital Stay (HOSPITAL_COMMUNITY)
Admission: EM | Admit: 2021-02-24 | Discharge: 2021-03-11 | DRG: 100 | Disposition: A | Discharge status: Skilled Nursing Facility | Payer: Medicaid Other | Attending: Internal Medicine | Admitting: Internal Medicine

## 2021-02-24 ENCOUNTER — Ambulatory Visit: Payer: Self-pay | Admitting: *Deleted

## 2021-02-24 ENCOUNTER — Other Ambulatory Visit: Payer: Self-pay

## 2021-02-24 ENCOUNTER — Encounter (HOSPITAL_COMMUNITY): Payer: Self-pay | Admitting: *Deleted

## 2021-02-24 DIAGNOSIS — F22 Delusional disorders: Secondary | ICD-10-CM | POA: Diagnosis not present

## 2021-02-24 DIAGNOSIS — G9340 Encephalopathy, unspecified: Secondary | ICD-10-CM | POA: Diagnosis present

## 2021-02-24 DIAGNOSIS — G9389 Other specified disorders of brain: Secondary | ICD-10-CM | POA: Diagnosis not present

## 2021-02-24 DIAGNOSIS — R9082 White matter disease, unspecified: Secondary | ICD-10-CM | POA: Diagnosis not present

## 2021-02-24 DIAGNOSIS — F411 Generalized anxiety disorder: Secondary | ICD-10-CM | POA: Diagnosis present

## 2021-02-24 DIAGNOSIS — Z880 Allergy status to penicillin: Secondary | ICD-10-CM

## 2021-02-24 DIAGNOSIS — G9341 Metabolic encephalopathy: Secondary | ICD-10-CM | POA: Diagnosis present

## 2021-02-24 DIAGNOSIS — I10 Essential (primary) hypertension: Secondary | ICD-10-CM | POA: Diagnosis not present

## 2021-02-24 DIAGNOSIS — Z7982 Long term (current) use of aspirin: Secondary | ICD-10-CM

## 2021-02-24 DIAGNOSIS — R569 Unspecified convulsions: Secondary | ICD-10-CM

## 2021-02-24 DIAGNOSIS — Z8249 Family history of ischemic heart disease and other diseases of the circulatory system: Secondary | ICD-10-CM | POA: Diagnosis not present

## 2021-02-24 DIAGNOSIS — Z833 Family history of diabetes mellitus: Secondary | ICD-10-CM | POA: Diagnosis not present

## 2021-02-24 DIAGNOSIS — E871 Hypo-osmolality and hyponatremia: Secondary | ICD-10-CM

## 2021-02-24 DIAGNOSIS — Z79899 Other long term (current) drug therapy: Secondary | ICD-10-CM

## 2021-02-24 DIAGNOSIS — G40101 Localization-related (focal) (partial) symptomatic epilepsy and epileptic syndromes with simple partial seizures, not intractable, with status epilepticus: Principal | ICD-10-CM | POA: Diagnosis present

## 2021-02-24 DIAGNOSIS — R4701 Aphasia: Secondary | ICD-10-CM | POA: Diagnosis present

## 2021-02-24 DIAGNOSIS — Z7984 Long term (current) use of oral hypoglycemic drugs: Secondary | ICD-10-CM

## 2021-02-24 DIAGNOSIS — Z20822 Contact with and (suspected) exposure to covid-19: Secondary | ICD-10-CM | POA: Diagnosis present

## 2021-02-24 DIAGNOSIS — E8809 Other disorders of plasma-protein metabolism, not elsewhere classified: Secondary | ICD-10-CM | POA: Diagnosis present

## 2021-02-24 DIAGNOSIS — G40911 Epilepsy, unspecified, intractable, with status epilepticus: Secondary | ICD-10-CM | POA: Diagnosis not present

## 2021-02-24 DIAGNOSIS — F17290 Nicotine dependence, other tobacco product, uncomplicated: Secondary | ICD-10-CM | POA: Diagnosis present

## 2021-02-24 DIAGNOSIS — Z8673 Personal history of transient ischemic attack (TIA), and cerebral infarction without residual deficits: Secondary | ICD-10-CM

## 2021-02-24 DIAGNOSIS — I700 Atherosclerosis of aorta: Secondary | ICD-10-CM | POA: Diagnosis present

## 2021-02-24 DIAGNOSIS — Z818 Family history of other mental and behavioral disorders: Secondary | ICD-10-CM

## 2021-02-24 DIAGNOSIS — R2689 Other abnormalities of gait and mobility: Secondary | ICD-10-CM | POA: Diagnosis not present

## 2021-02-24 DIAGNOSIS — E119 Type 2 diabetes mellitus without complications: Secondary | ICD-10-CM | POA: Diagnosis not present

## 2021-02-24 DIAGNOSIS — G40901 Epilepsy, unspecified, not intractable, with status epilepticus: Secondary | ICD-10-CM

## 2021-02-24 DIAGNOSIS — J9811 Atelectasis: Secondary | ICD-10-CM | POA: Diagnosis not present

## 2021-02-24 DIAGNOSIS — E785 Hyperlipidemia, unspecified: Secondary | ICD-10-CM | POA: Diagnosis present

## 2021-02-24 DIAGNOSIS — E1129 Type 2 diabetes mellitus with other diabetic kidney complication: Secondary | ICD-10-CM

## 2021-02-24 DIAGNOSIS — G40909 Epilepsy, unspecified, not intractable, without status epilepticus: Secondary | ICD-10-CM | POA: Diagnosis not present

## 2021-02-24 DIAGNOSIS — E1165 Type 2 diabetes mellitus with hyperglycemia: Secondary | ICD-10-CM | POA: Diagnosis not present

## 2021-02-24 DIAGNOSIS — R457 State of emotional shock and stress, unspecified: Secondary | ICD-10-CM | POA: Diagnosis not present

## 2021-02-24 HISTORY — DX: Type 2 diabetes mellitus without complications: E11.9

## 2021-02-24 HISTORY — DX: Unspecified convulsions: R56.9

## 2021-02-24 LAB — CBC WITH DIFFERENTIAL/PLATELET
Abs Immature Granulocytes: 0.01 10*3/uL (ref 0.00–0.07)
Basophils Absolute: 0.1 10*3/uL (ref 0.0–0.1)
Basophils Relative: 1 %
Eosinophils Absolute: 0.1 10*3/uL (ref 0.0–0.5)
Eosinophils Relative: 1 %
HCT: 45.9 % (ref 39.0–52.0)
Hemoglobin: 15.2 g/dL (ref 13.0–17.0)
Immature Granulocytes: 0 %
Lymphocytes Relative: 40 %
Lymphs Abs: 3 10*3/uL (ref 0.7–4.0)
MCH: 25.8 pg — ABNORMAL LOW (ref 26.0–34.0)
MCHC: 33.1 g/dL (ref 30.0–36.0)
MCV: 77.9 fL — ABNORMAL LOW (ref 80.0–100.0)
Monocytes Absolute: 0.5 10*3/uL (ref 0.1–1.0)
Monocytes Relative: 7 %
Neutro Abs: 3.8 10*3/uL (ref 1.7–7.7)
Neutrophils Relative %: 51 %
Platelets: 276 10*3/uL (ref 150–400)
RBC: 5.89 MIL/uL — ABNORMAL HIGH (ref 4.22–5.81)
RDW: 13.8 % (ref 11.5–15.5)
WBC: 7.5 10*3/uL (ref 4.0–10.5)
nRBC: 0 % (ref 0.0–0.2)

## 2021-02-24 LAB — COMPREHENSIVE METABOLIC PANEL
ALT: 14 U/L (ref 0–44)
AST: 23 U/L (ref 15–41)
Albumin: 3.9 g/dL (ref 3.5–5.0)
Alkaline Phosphatase: 70 U/L (ref 38–126)
Anion gap: 9 (ref 5–15)
BUN: 15 mg/dL (ref 6–20)
CO2: 24 mmol/L (ref 22–32)
Calcium: 9.1 mg/dL (ref 8.9–10.3)
Chloride: 105 mmol/L (ref 98–111)
Creatinine, Ser: 1.2 mg/dL (ref 0.61–1.24)
GFR, Estimated: >60 mL/min (ref >60)
Glucose, Bld: 103 mg/dL — ABNORMAL HIGH (ref 70–99)
Potassium: 4 mmol/L (ref 3.5–5.1)
Sodium: 138 mmol/L (ref 135–145)
Total Bilirubin: 0.9 mg/dL (ref 0.3–1.2)
Total Protein: 7.4 g/dL (ref 6.5–8.1)

## 2021-02-24 LAB — RESP PANEL BY RT-PCR (FLU A&B, COVID) ARPGX2
Influenza A by PCR: NEGATIVE
Influenza B by PCR: NEGATIVE
SARS Coronavirus 2 by RT PCR: NEGATIVE

## 2021-02-24 MED ORDER — LORAZEPAM 2 MG/ML IJ SOLN
1.0000 mg | Freq: Once | INTRAMUSCULAR | Status: AC
  Administered 2021-02-24: 1 mg via INTRAVENOUS
  Filled 2021-02-24: qty 1

## 2021-02-24 MED ORDER — LEVETIRACETAM 500 MG PO TABS
500.0000 mg | ORAL_TABLET | Freq: Two times a day (BID) | ORAL | Status: DC
  Administered 2021-02-25: 500 mg via ORAL
  Filled 2021-02-24: qty 1

## 2021-02-24 MED ORDER — LORAZEPAM 2 MG/ML IJ SOLN
2.0000 mg | Freq: Once | INTRAMUSCULAR | Status: AC
  Administered 2021-02-24: 2 mg via INTRAVENOUS

## 2021-02-24 MED ORDER — LORAZEPAM 2 MG/ML IJ SOLN: 1.0000 mg | INTRAMUSCULAR | Status: DC | PRN

## 2021-02-24 MED ORDER — LEVETIRACETAM 500 MG PO TABS: 500.0000 mg | ORAL_TABLET | Freq: Two times a day (BID) | ORAL | Status: DC

## 2021-02-24 MED ORDER — LORAZEPAM 2 MG/ML IJ SOLN: 1.0000 mg | Freq: Once | INTRAMUSCULAR | Status: DC

## 2021-02-24 MED ORDER — LORAZEPAM 2 MG/ML IJ SOLN
1.0000 mg | Freq: Once | INTRAMUSCULAR | Status: DC
  Filled 2021-02-24: qty 1

## 2021-02-24 MED ORDER — ACETAMINOPHEN 650 MG RE SUPP: 650.0000 mg | Freq: Four times a day (QID) | RECTAL | Status: DC | PRN

## 2021-02-24 MED ORDER — ACETAMINOPHEN 325 MG PO TABS
650.0000 mg | ORAL_TABLET | Freq: Four times a day (QID) | ORAL | Status: DC | PRN
  Administered 2021-03-02: 650 mg via ORAL
  Filled 2021-02-24: qty 2

## 2021-02-24 MED ORDER — CLONIDINE HCL 0.1 MG PO TABS
0.1000 mg | ORAL_TABLET | Freq: Two times a day (BID) | ORAL | Status: DC
  Administered 2021-02-24 – 2021-02-25 (×2): 0.1 mg via ORAL
  Filled 2021-02-24 (×2): qty 1

## 2021-02-24 MED ORDER — LACOSAMIDE 50 MG PO TABS
100.0000 mg | ORAL_TABLET | Freq: Two times a day (BID) | ORAL | Status: DC
  Administered 2021-02-25: 100 mg via ORAL
  Filled 2021-02-24: qty 2

## 2021-02-24 MED ORDER — SODIUM CHLORIDE 0.9 % IV SOLN
200.0000 mg | Freq: Once | INTRAVENOUS | Status: AC
  Administered 2021-02-24: 200 mg via INTRAVENOUS
  Filled 2021-02-24: qty 20

## 2021-02-24 MED ORDER — AMLODIPINE BESYLATE 10 MG PO TABS
10.0000 mg | ORAL_TABLET | Freq: Every day | ORAL | Status: DC
  Administered 2021-02-24 – 2021-03-11 (×15): 10 mg via ORAL
  Filled 2021-02-24: qty 1
  Filled 2021-02-24: qty 2
  Filled 2021-02-24 (×6): qty 1
  Filled 2021-02-24: qty 2
  Filled 2021-02-24 (×2): qty 1
  Filled 2021-02-24: qty 2
  Filled 2021-02-24 (×2): qty 1
  Filled 2021-02-24: qty 2
  Filled 2021-02-24: qty 1

## 2021-02-24 MED ORDER — LEVETIRACETAM IN NACL 1500 MG/100ML IV SOLN
1500.0000 mg | Freq: Once | INTRAVENOUS | Status: AC
  Administered 2021-02-24: 1500 mg via INTRAVENOUS
  Filled 2021-02-24: qty 100

## 2021-02-24 NOTE — H&P (Signed)
History and Physical    PLEASE NOTE THAT DRAGON DICTATION SOFTWARE WAS USED IN THE CONSTRUCTION OF THIS NOTE.   Arthur [name]:[id number] DOB: 22-Mar-1961 DOA: 02/24/2021  PCP: [name], MD Patient coming from: home   I have personally briefly reviewed patient's old medical records in [facility]  Chief Complaint: Anxiety  HPI: Arthur [name] is a 60 y.o. male with medical history significant for seizures, essential hypertension, type 2 diabetes mellitus, who is admitted to [facility] on 02/24/2021 with seizure after presenting from home to [facility] ED complaining of anxiety.   In the setting of patient's current altered mental status and associated current inability to contribute to the history, the following history is provided by the patient's wife, via my discussions with the EDP, and via chart review.  The patient was reportedly recently up in [place] for the funeral of his father.  Per patient's wife, patient has taken the passing of his father particularly hard, with the patient previously verbalizing associated significant increase in stress relating to this.  While up in [place], patient reportedly was showing evidence of intermittent unilateral twitching of his face, in the absence of any associated generalized tonic-clonic activity.  These prior episodes of unilateral facial twitching were reportedly not associated with any loss of consciousness or altered mental status, and patient did not as well as family felt at this new finding with [place] as a consequence of the stress relating to the recent passing of the patient's father.  The patient has a history of seizures, that is reportedly never presented with facial twitching, but have typically been associated with generalized tonic-clonic activity involving the bilateral extremities.   In the setting of worsening anxiety, which the patient reportedly felt was as a consequence of his reaction to his father's recent passing,  the patient presented to [facility] emergency department today for further evaluation of this anxiety.  Initially, in the ED, he was noted to exhibit shaking of the bilateral upper extremities that was not associated with any mental status or loss of consciousness reportedly received an loperamide the x1, with ensuing resolution of this finding.  Subsequently, he demonstrated evidence of right-sided facial twitching that was associated with mental status, diminished responsiveness.  The right-sided facial twitching reportedly resolved following a total of 3 additional milligrams of IV Ativan without subsequent recurrence, although the patient was noted to exhibit diminished responsiveness on resolution of this right-sided facial twitching.  No reported associated loss of bowel/bladder function and no attempts of associated tongue biting.   While the patient has a prior history of alcohol abuse, wife conveyed that he has not consumed any alcohol recently, and reports that the patient has no history of recreational drug use, including that of the stimulant class, including no history of cocaine, methamphetamine, or amphetamine use.  No recent use of benzodiazepines.  He reportedly demonstrates good compliance with this outpatient antiepileptic regimen, which is reported to consists of Vimpat 50 mg p.o. twice daily as well as Keppra 500 mg p.o. twice daily, without reported recent modifications thereof.  Additionally, it does not appear that patient is on any recent opioids or antidepressant medications, including no recent use of SSRIs or SNRIs.  He does have a history of type 2 diabetes mellitus, he does not take insulin or sulfonylureas as an outpatient.  Of note, most recent hemoglobin A1c was found to be 6.8% in May 2022.  No reported recent preceding trauma, fall, or injury.  Patient reportedly had not been complaining of any recent preceding subjective fever, chills, rigors, or generalized myalgias.  No  report of recent chest pain, shortness of breath, diaphoresis, dizziness, presyncope, or syncope.  No reported preceding cute focal weakness, acute focal numbness, paresthesias, slurred speech, facial droop, vertigo, nausea, vomiting, or acute change in vision.     Additional [facility] ED Course:  Vital signs in the ED were notable for the following: Temperature max 98.7, heart rate 84-1 06; blood pressure 128/93 -177/107; respiratory rate 16-25, oxygen saturation 98 to 100% on room air.  Labs were notable for the following: CMP was notable for the following: Sodium 138, bicarbonate 24, anion gap 9, creatinine 1.20 relative to baseline creatinine range of 1.20-1.4, glucose 103, and liver enzymes were found to be within normal limits.  CBC notable for white blood cell count 7500.  Serum ethanol level as well as urinary drug screen were ordered, with results currently pending.  Screening COVID-19/influenza PCR checked in the ED this evening, with results currently pending.  Imaging and additional notable ED work-up: EKG, in comparison to most recent prior from 10/16/2020 showed sinus rhythm with heart rate 97, normal intervals, T wave inversion in lead III, unchanged from most recent prior EKG, and no evidence of ST changes, including no evidence of ST elevation.  Noncontrast CT of the head showed no evidence of acute intracranial process.  Chest x-ray shows low lung volumes with mild bibasilar atelectasis, but otherwise no evidence of acute cardiopulmonary process, including no evidence of overt infiltrate, edema, effusion, or pneumothorax.  Neurology was consulted, and EEG initiated, with initial finding of left parietal slowing reportedly consistent with postictal state in the setting of previous right facial twitching, in the absence of any overt evidence of active seizures.  The patient received loading doses of IV Vimpat and IV Keppra, as further quantified below. I further discussed patient's case with the  on-call neurologist, Dr. Derry Lory, who recommended increasing home dose of Vimpat from 50 mg p.o. twice daily to 100 mg p.o. bid, while maintaining current home dose of Keppra.   While in the ED, the following were administered: A total of 4 mg of IV Ativan; Vimpat 200 mg IV x1 dose, Keppra 1500 mg IV x1.     Review of Systems: As per HPI otherwise 10 point review of systems negative.   Past Medical History:  Diagnosis Date   ETOH abuse    Hypertension     Past Surgical History:  Procedure Laterality Date   HEMORRHOID SURGERY      Social History:  reports that he has been smoking cigars. He has never used smokeless tobacco. He reports that he does not currently use alcohol. He reports that he does not use drugs.   Allergies  Allergen Reactions   Penicillins Hives    Did it involve swelling of the face/tongue/throat, SOB, or low BP? Y Did it involve sudden or severe rash/hives, skin peeling, or any reaction on the inside of your mouth or nose? N Did you need to seek medical attention at a hospital or doctor's office? Y When did it last happen?  Over 5 Years Ago     If all above answers are "NO", may proceed with cephalosporin use.     Family History  Problem Relation Age of Onset   Diabetes Mother    Hypertension Mother    Diabetes Father    Hypertension Father    Hypertension Sister     Family history  reviewed and not pertinent    Prior to Admission medications   Medication Sig Start Date End Date Taking? Authorizing Provider  cloNIDine (CATAPRES) 0.1 MG tablet Take 1 tablet (0.1 mg total) by mouth 2 (two) times daily. 12/19/20  Yes Katsadouros, Vasilios, MD  lacosamide (VIMPAT) 50 MG TABS tablet Take 1 tablet (50 mg total) by mouth 2 (two) times daily. 12/05/20  Yes Penumalli, Glenford Bayley, MD  levETIRAcetam (KEPPRA) 500 MG tablet Take 1 tablet (500 mg total) by mouth 2 (two) times daily. 01/18/21  Yes Penumalli, Glenford Bayley, MD  metFORMIN (GLUCOPHAGE) 500 MG tablet Take  1 tablet (500 mg total) by mouth 2 (two) times daily with a meal. IM PROGRAM 12/19/20  Yes Katsadouros, Vasilios, MD  amLODipine (NORVASC) 10 MG tablet Take 1 tablet (10 mg total) by mouth daily. Patient not taking: Reported on 02/24/2021 11/16/20   [name], MD  aspirin EC 81 MG tablet Take 81 mg by mouth daily. Swallow whole. Patient not taking: No sig reported    [provider]  Multiple Vitamin (MULTIVITAMIN WITH MINERALS) TABS tablet Take 1 tablet by mouth daily. Patient not taking: No sig reported 11/16/20   [name], MD     Objective    Physical Exam: Vitals:   02/24/21 1930 02/24/21 1945 02/24/21 2000 02/24/21 2012  BP: (!) 126/104 (!) 155/109 (!) 177/107 (!) 177/107  Pulse: 87   84  Resp: 20 20 (!) 25 (!) 35  Temp:      TempSrc:      SpO2: 100%   100%  Weight:      Height:        General: appears to be stated age; diminished responsiveness; briefly opens eyes to verbal stimuli; non-verbal;   Skin: warm, dry, no rash Head:  AT/[place] Mouth:  Oral mucosa membranes appear moist, normal dentition Neck: supple; trachea midline Heart:  RRR; did not appreciate any M/R/G Lungs: CTAB, did not appreciate any wheezes, rales, or rhonchi Abdomen: + BS; soft, ND,  Vascular: 2+ pedal pulses b/l; 2+ radial pulses b/l Extremities: no peripheral edema, no muscle wasting Neuro: In the setting of the patient's current mental status and associated inability to follow instructions, unable to perform full neurologic exam at this time.  As such, assessment of strength, sensation, and cranial nerves is limited at this time. Patient noted to spontaneously move all 4 extremities. No obvious tremors noted.     Labs on Admission: I have personally reviewed following labs and imaging studies  CBC: Recent Labs  Lab 02/24/21 1559  WBC 7.5  NEUTROABS 3.8  HGB 15.2  HCT 45.9  MCV 77.9*  PLT 276   Basic Metabolic Panel: Recent Labs  Lab 02/24/21 1559  NA 138  K 4.0   CL 105  CO2 24  GLUCOSE 103*  BUN 15  CREATININE 1.20  CALCIUM 9.1   GFR: Estimated Creatinine Clearance: 58.4 mL/min (by C-G formula based on SCr of 1.2 mg/dL). Liver Function Tests: Recent Labs  Lab 02/24/21 1559  AST 23  ALT 14  ALKPHOS 70  BILITOT 0.9  PROT 7.4  ALBUMIN 3.9   No results for input(s): LIPASE, AMYLASE in the last 168 hours. No results for input(s): AMMONIA in the last 168 hours. Coagulation Profile: No results for input(s): INR, PROTIME in the last 168 hours. Cardiac Enzymes: No results for input(s): CKTOTAL, CKMB, CKMBINDEX, TROPONINI in the last 168 hours. BNP (last 3 results) No results for input(s): PROBNP in the last 8760  hours. HbA1C: No results for input(s): HGBA1C in the last 72 hours. CBG: No results for input(s): GLUCAP in the last 168 hours. Lipid Profile: No results for input(s): CHOL, HDL, LDLCALC, TRIG, CHOLHDL, LDLDIRECT in the last 72 hours. Thyroid Function Tests: No results for input(s): TSH, T4TOTAL, FREET4, T3FREE, THYROIDAB in the last 72 hours. Anemia Panel: No results for input(s): VITAMINB12, FOLATE, FERRITIN, TIBC, IRON, RETICCTPCT in the last 72 hours. Urine analysis:    Component Value Date/Time   COLORURINE YELLOW 10/02/2019 2120   APPEARANCEUR CLEAR 10/02/2019 2120   LABSPEC 1.032 (H) 10/02/2019 2120   PHURINE 6.0 10/02/2019 2120   GLUCOSEU NEGATIVE 10/02/2019 2120   HGBUR NEGATIVE 10/02/2019 2120   BILIRUBINUR NEGATIVE 10/02/2019 2120   KETONESUR 5 (A) 10/02/2019 2120   PROTEINUR 100 (A) 10/02/2019 2120   UROBILINOGEN 1.0 10/05/2007 1713   NITRITE NEGATIVE 10/02/2019 2120   LEUKOCYTESUR NEGATIVE 10/02/2019 2120    Radiological Exams on Admission: CT Head Wo Contrast  Result Date: 02/24/2021 CLINICAL DATA:  Generalized anxiety EXAM: CT HEAD WITHOUT CONTRAST TECHNIQUE: Contiguous axial images were obtained from the base of the skull through the vertex without intravenous contrast. COMPARISON:  10/16/2020  FINDINGS: Brain: No evidence of acute infarction, hemorrhage, hydrocephalus, extra-axial collection or mass lesion/mass effect. Periventricular and deep white matter hypodensity. Unchanged encephalomalacia of the left parietal lobe and anterior limb of the right internal capsule. Vascular: No hyperdense vessel or unexpected calcification. Skull: Normal. Negative for fracture or focal lesion. Sinuses/Orbits: No acute finding. Other: None. IMPRESSION: 1.  No acute intracranial pathology. 2. 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 Electronically Signed   By: Jearld Lesch M.D.   On: 02/24/2021 17:39   DG Chest Portable 1 View  Result Date: 02/24/2021 CLINICAL DATA:  Anxiety. EXAM: PORTABLE CHEST 1 VIEW COMPARISON:  Oct 16, 2020 FINDINGS: Decreased lung volumes are seen with mild areas of atelectasis noted within the bilateral lung bases. There is no evidence of a pleural effusion or pneumothorax. The cardiac silhouette is moderately enlarged. This may be technical in origin. The visualized skeletal structures are unremarkable. IMPRESSION: Low lung volumes with mild bibasilar atelectasis. Electronically Signed   By: Aram Candela M.D.   On: 02/24/2021 20:11     EKG: Independently reviewed, with result as described above.    Assessment/Plan   Arthur [name] is a 60 y.o. male with medical history significant for seizures, essential hypertension, type 2 diabetes mellitus, who is admitted to [facility] on 02/24/2021 with seizure after presenting from home to [facility] ED complaining of anxiety.    Principal Problem:   Seizures (HCC) Active Problems:   Essential hypertension   Acute encephalopathy   Type 2 diabetes mellitus (HCC)    #) Seizure: Witnessed episode in [facility] ED today of development of right sided facial twitching associated with diminished responsiveness, with subsequent resolution following  30 mg of IV Ativan and development of ensuing, but gradually improving somnolence and diminished responsiveness, felt to be consistent with postictal state. Dr. Derry Lory of neurology consulted, and conveyed that ensuing EEG demonstrates evidence of a left parietal slowing suggestive of with postictal state but is consistent with previously exhibited distribution of right-sided facial twitching, without EEG evidence of active seizures. Dr. Derry Lory conveyed that the preceding shaking activity in the bilateral upper extremities was unlikely to represent seizure given that this activity was not associate with any altered mental status that  would be expected to be present in the bilateral distribution of seizure-like activity.   This is in the context of a documented history of prior seizures, with reported good compliance on home antiepileptic regimen consisting of Vimpat and Keppra, as quantified above, without any reported recent associated dose adjustments.  Of note, noncontrast CT showed no evidence of acute intracranial process, including no evidence of intracranial hemorrhage or acute ischemic infarct.  Wife conveys that with the patient does have previous history of alcohol abuse, that he is not consumed any alcohol over the last several months at least.  Add on serum ethanol level as well as urinary drug screen.  Of note, patient has history of diabetes, with initial laboratory evidence revealing no findings suggestive of hypoglycemia.  Close monitoring of ensuing blood sugar via routine Accu-Cheks, as outlined below.  No reported history of recreational drug use, including no use of stimulants or opioids.  No presenting evidence of hyponatremia.  Will add on serum magnesium level to evaluate for any contribution from hypomagnesemia.  No evidence of suggest acute underlying infectious process at this time.   Received IV loading doses of both Vimpat and Keppra in the ED, as further quantified above,  with ensuing neurology recommendation to increase home dose of Vimpat from 50 mg p.o. twice daily to 100 mg p.o. twice daily, while maintaining existing home dose of Keppra 500 mg p.o. twice daily.       Plan: NPO until mental status improves sufficiently such that the patient is able to participate in nursing bedside swallow screen. Seizure precautions. Prn IV Ativan for breakthrough seizure.  Continue home Keppra and increase home Vimpat dose per neurology consultation, as quantified above monitor on telemetry and continuous pulse-ox. CMP in the morning. Repeat CBC in the morning. Add-on serum Mg level.  Add on serum ethanol level. Check UDS.   Serial neuro checks been ordered.  Check UA.  Every 4 hours Accu-Cheks x5 occurrences to evaluate for any evidence of contributory hypoglycemia.  Follow-up result of nasopharyngeal COVID-19 screen performed in the ED this evening. Neurology formally consulted, as above.           #) Acute encephalopathy: Somnolence and diminished responsiveness following today's episode of right facial twitching with ensuing diminished responsiveness felt to be considered with postical state and pharmacologic influence of 4 mg of IV Ativan, without evidence of active seizure on eeg monitoring this evening, as further detailed above. No additional contributory source identified at this time.  Does not appear septic, no evidence of underlying infectious process at this time, although screening nasopharyngeal COVID-19 PCR performed in the ED today is currently pending.  will also further evaluate for any underlying infectious process via checking of urinalysis.       Plan: Work-up and management of presenting  seizure, as further described above. add on serum ethanol level. And uds  Follow-up result of urinalysis as well as screening COVID-19 PCR.  Repeat CMP in the morning as well as CBC with differential at that time.  Check TSH, MMA, vbg.  keep n.p.o. for now, until mental  status improves sufficiently such as the patient is able to participate in and pass nursing bedside swallow screen.       #) Essential hypertension: Documented history of such, with the patient and hypertensive regimen reportedly consisting of oral clonidine as well as Norvasc, with report that the patient has recently been off of his Norvasc and running out of this medication.  No evidence  of associated hypotension  Plan: Resume home antihypertensive medication.  Close monitoring ensuing blood pressure via routine vital signs.      #) Type 2 diabetes mellitus: Documented history of such, most recent hemoglobin A1c noted to be 6.8% in May 2022.  Not on any insulin as an outpatient, rather on metformin as a sole oral hypoglycemic agent.  Presenting blood sugar noted to be 103.  In the setting of presenting seizure in the context of history of diabetes, will repeat hemoglobin A1c to eval for contribution hypoglycemia, although this is felt less likely per the above history.  Plan: Hold metformin.  Accu-Cheks every 4 hours x5 occurrences, without associated order for sliding scale insulin at this time in order to reduce risk of ensuing development of hypoglycemia.  Check A1c, as above.     DVT prophylaxis: scd's  Code Status: Full code Disposition Plan: Per Rounding Team Consults called: case discussed with on-call neurologist, Dr. Derry Lory, as further detailed above ;  Admission status: inpatient; pcu     Of note, this patient was added by me to the following Admit List/Treatment Team: mcadmits.    Of note, the Adult Admission Order Set (Multimorbid order set) was used by me in the admission process for this patient.   PLEASE NOTE THAT DRAGON DICTATION SOFTWARE WAS USED IN THE CONSTRUCTION OF THIS NOTE.   Angie Fava DO [facility] Pager [phone] From 6PM - 2AM  Otherwise, please contact night-coverage  [email or web address] Password [facility]   02/24/2021, 8:48 PM

## 2021-02-24 NOTE — ED Notes (Signed)
The pt reports a recent death of his father

## 2021-02-24 NOTE — Progress Notes (Signed)
LTM EEG hooked up and running - no initial skin breakdown - push button tested - neuro notified. [facility] monitoring.  

## 2021-02-24 NOTE — ED Notes (Signed)
Pt was sitting in wheelchair, and then began violently shaking and grunting. This tech and another tech approached the pt and he then jumped out of the wheelchair, and was still shaking and grunting. Triage RN and EDPA notified, and came out into the lobby to see pt. Staff held pt up and got pt onto emergency stretcher. Pt then taken to [facility]. Pt did respond when talking to pt.

## 2021-02-24 NOTE — ED Notes (Signed)
Patient transported to CT 

## 2021-02-24 NOTE — Telephone Encounter (Signed)
Pts wife calling and is requesting to have a nurse give a call back to discuss possible side effects of the pts medications. Please advise.   Wife is concerned about changes in patient demeanor- patient is having anxiety and paranoia. Wife reports episodes since returning home yesterday from trip out of town-2 weeks to fathers funeral- patient had episode this morning.Patient is having trouble sleeping and he is fearful of noises and changing rooms. Patient is not hallucinating and is not suicidal. Advised ED for evaluation. Reason for Disposition  Patient sounds very sick or weak to the triager  Answer Assessment - Initial Assessment Questions 1. CONCERN: "Did anything happen that prompted you to call today?"      Husband returned home from being out of town 2 weeks at [employer]- he has anxiety and paranoia  2. ANXIETY SYMPTOMS: "Can you describe how you (your loved one; patient) have been feeling?" (e.g., tense, restless, panicky, anxious, keyed up, overwhelmed, sense of impending doom).      Grief process 3. ONSET: "How long have you been feeling this way?" (e.g., hours, days, weeks)     Unsure- patient returned home yesterday and has been exhibiting symptoms since he arrived home 4. SEVERITY: "How would you rate the level of anxiety?" (e.g., 0 - 10; or mild, moderate, severe).     Moderate/severe 5. FUNCTIONAL IMPAIRMENT: "How have these feelings affected your ability to do daily activities?" "Have you had more difficulty than usual doing your normal daily activities?" (e.g., getting better, same, worse; self-care, school, work, interactions)     Yes- he has episodes of fear- noises, going into rooms 6. HISTORY: "Have you felt this way before?" "Have you ever been diagnosed with an anxiety problem in the past?" (e.g., generalized anxiety disorder, panic attacks, PTSD). If Yes, ask: "How was this problem treated?" (e.g., medicines, counseling, etc.)     Patient has history of alcohol  abuse and seizures- not sure about emotional diagnosis 7. RISK OF HARM - SUICIDAL IDEATION: "Do you ever have thoughts of hurting or killing yourself?" If Yes, ask:  "Do you have these feelings now?" "Do you have a plan on how you would do this?"     Per wife- no 8. TREATMENT:  "What has been done so far to treat this anxiety?" (e.g., medicines, relaxation strategies). "What has helped?"     Patient has ben having episodes- wife has been able to stay with him and calm him 9. TREATMENT - THERAPIST: "Do you have a counselor or therapist? Name?"     N/a 10. POTENTIAL TRIGGERS: "Do you drink caffeinated beverages (e.g., coffee, colas, teas), and how much daily?" "Do you drink alcohol or use any drugs?" "Have you started any new medicines recently?"     N/a 10. PATIENT SUPPORT: "Who is with you now?" "Who do you live with?" "Do you have family or friends who you can talk to?"        Wife is with patient 32. OTHER SYMPTOMS: "Do you have any other symptoms?" (e.g., feeling depressed, trouble concentrating, trouble sleeping, trouble breathing, palpitations or fast heartbeat, chest pain, sweating, nausea, or diarrhea)       Trouble sleeping 12. PREGNANCY: "Is there any chance you are pregnant?" "When was your last menstrual period?"       N/a  Protocols used: Anxiety and Panic Attack-A-AH

## 2021-02-24 NOTE — ED Notes (Signed)
EEG tech at bedside. 

## 2021-02-24 NOTE — ED Triage Notes (Signed)
The pt arrived by gems from home he is feeling anxious just today he cannot think of anything causing it

## 2021-02-24 NOTE — ED Provider Notes (Addendum)
Emergency Medicine Provider Triage Evaluation Note  Arthur [name] , a 60 y.o. male  was evaluated in triage.  Per EMS, pt called out for anxiety. Pt reports feeling lightheaded. States he lost his father earlier this week and feels anxious. Denies chest pain, sob, abd pain, nvd. Has had decreased po intake.   BP 172/130, HR 90, RR 16.   Review of Systems  Positive: Lightheaded, anxious Negative: Si, hi, chest pain, sob, abd pain, nvd  Physical Exam  BP (!) 137/108 (BP Location: Left Arm)   Pulse 88   Temp 98.7 F (37.1 C) (Oral)   Resp 16   Ht 5\' 3"  (1.6 m)   Wt 68.5 kg   SpO2 100%   BMI 26.75 kg/m  Gen:   Awake, no distress   Resp:  Normal effort  MSK:   Moves extremities without difficulty  Other:  Withdrawn, flat affect  Medical Decision Making  Medically screening exam initiated at 5:32 PM.  Appropriate orders placed.  was informed that the remainder of the evaluation will be completed by another provider, this initial triage assessment does not replace that evaluation, and the importance of remaining in the ED until their evaluation is complete.  4:30 PM called to waiting room. Pt had shaking/twitching activity of the bilat upper extremities, but was standing with assistance and able to respond to questions. He is disoriented.  He was taken to triage and observed. CT head and additional studies ordered. Iv ativan ordered. Current presentation does not appear consistent with tonic clonic seizure. Pt to be prioritized for a room.   Prior to head ct, pt still responding to questions intermittently having body twitching/shaking. After returning to triage from CT pt noted to have responding to questions and is now having right facial twitching. Current symptoms concerning for focal seizure. Additional mg of ativan ordered and charge made aware pt needs room now.    [name], PA-C 02/24/21 1557    02/26/21, PA-C 02/24/21 1634    02/26/21,  PA-C 02/24/21 1635    Adalberto Metzgar S, PA-C 02/24/21 1732    02/26/21, MD 03/15/21 [phone]

## 2021-02-24 NOTE — ED Notes (Signed)
Wife Raywood Wailes [phone] would like an update

## 2021-02-24 NOTE — Procedures (Signed)
Patient Name: Arthur [name]  MRN: [id number]  Epilepsy Attending: Charlsie Quest  Referring Physician/Provider: Dr Ritta Slot Date: 02/24/2021 Duration: 27.41 mins  Patient history: 60yo M with h/o epilepsy, presented with anxiety and had an episode of focal right-sided twitching, right gaze deviation and not following commands. EEG to evaluate for seizure  Level of alertness:  lethargic   AEDs during EEG study: LEV, LCM  Technical aspects: This EEG study was done with scalp electrodes positioned according to the 10-20 International system of electrode placement. Electrical activity was acquired at a sampling rate of 500Hz  and reviewed with a high frequency filter of 70Hz  and a low frequency filter of 1Hz . EEG data were recorded continuously and digitally stored.   Description: EEG showed continuous 2-3hz  rhythmic delta slowing in left hemisphere. There is also 3-5hz  continuous generalized and maximal left posterior quadrant theta-delta slowing admixed with 15-18Hz  generalized beta activity. Hyperventilation and photic stimulation were not performed.     ABNORMALITY - Lateralized rhythmic delta activity, left hemisphere and maximal left posterior quadrant - Continuous slow, generalized and maximal left posterior quadrant  IMPRESSION: This study is suggestive of cortical dysfunction arising from left posterior quadrant likely secondary to underlying structural abnormality /stroke, post-ictal state.Of note, lateralized rhythmic delta activity seen in left hemisphere is also on the ictal-interictal continuum with low potential for seizure recurrence. Additionally, there is also moderate diffuse encephalopathy, nonspecific etiology but likely related to seizure. No seizures or definite epileptiform discharges were seen throughout the recording.  Dr was notified.   Atharv Barriere 

## 2021-02-24 NOTE — ED Provider Notes (Signed)
[facility] EMERGENCY DEPARTMENT Provider Note   CSN: [id number] Arrival date & time: 02/24/21  1544     History Chief Complaint  Patient presents with   Anxiety    Arthur [name] is a 60 y.o. male.  Level 5 caveat due to seizure-like activity upon my initial evaluation.  Patient was triage several hours prior to my evaluation.  Triage staff states that patient came in after calling EMS for anxiety.  He reports feeling lightheaded.  Recently lightheaded lost his father and having some anxiety issues.  He has a history of seizures and alcohol abuse.  He is on Vimpat and Keppra per chart review.  Unsure the last time he had any alcohol.  According to triage staff patient did have an episode of shaking and twitching activity but was talking during the event.  About an hour later patient was found to have some focal right-sided twitching, right gaze deviation and not following commands.  The history is provided by medical records (triage staff).  Seizures Seizure activity on arrival: yes       Past Medical History:  Diagnosis Date   ETOH abuse    Hypertension     Patient Active Problem List   Diagnosis Date Noted   Aortic atherosclerosis (HCC) 11/22/2020   History of seizures 11/22/2020   Alcohol use disorder, severe, dependence (HCC) 11/22/2020   Type 2 diabetes mellitus (HCC) 11/22/2020   Anemia 11/22/2020   Thrombocytopenia (HCC) 11/22/2020   Hyperlipidemia 11/22/2020   Transaminitis    Essential hypertension 10/08/2019   History of CVA (cerebrovascular accident) 10/02/2019    Past Surgical History:  Procedure Laterality Date   HEMORRHOID SURGERY         Family History  Problem Relation Age of Onset   Diabetes Mother    Hypertension Mother    Diabetes Father    Hypertension Father    Hypertension Sister     Social History   Tobacco Use   Smoking status: Every Day    Types: Cigars   Smokeless tobacco: Never   Tobacco comments:    Smokes  black& mild - 1 a day.  Vaping Use   Vaping Use: Never used  Substance Use Topics   Alcohol use: Not Currently   Drug use: Never    Home Medications Prior to Admission medications   Medication Sig Start Date End Date Taking? Authorizing Provider  cloNIDine (CATAPRES) 0.1 MG tablet Take 1 tablet (0.1 mg total) by mouth 2 (two) times daily. 12/19/20  Yes Katsadouros, Vasilios, MD  lacosamide (VIMPAT) 50 MG TABS tablet Take 1 tablet (50 mg total) by mouth 2 (two) times daily. 12/05/20  Yes Penumalli, Glenford Bayley, MD  levETIRAcetam (KEPPRA) 500 MG tablet Take 1 tablet (500 mg total) by mouth 2 (two) times daily. 01/18/21  Yes Penumalli, Glenford Bayley, MD  metFORMIN (GLUCOPHAGE) 500 MG tablet Take 1 tablet (500 mg total) by mouth 2 (two) times daily with a meal. IM PROGRAM 12/19/20  Yes Katsadouros, Vasilios, MD  amLODipine (NORVASC) 10 MG tablet Take 1 tablet (10 mg total) by mouth daily. Patient not taking: Reported on 02/24/2021 11/16/20   [name], MD  aspirin EC 81 MG tablet Take 81 mg by mouth daily. Swallow whole. Patient not taking: No sig reported    [provider]  Multiple Vitamin (MULTIVITAMIN WITH MINERALS) TABS tablet Take 1 tablet by mouth daily. Patient not taking: No sig reported 11/16/20   [name], MD  Allergies    Penicillins  Review of Systems   Review of Systems  Unable to perform ROS: Mental status change  Neurological:  Positive for seizures.   Physical Exam Updated Vital Signs BP (!) 177/107 (BP Location: Right Arm)   Pulse 84   Temp 98.7 F (37.1 C) (Oral)   Resp (!) 35   Ht 5\' 3"  (1.6 m)   Wt 68.5 kg   SpO2 100%   BMI 26.75 kg/m   Physical Exam Vitals and nursing note reviewed.  Constitutional:      Appearance: He is well-developed. He is ill-appearing.  HENT:     Head: Normocephalic and atraumatic.     Nose: Nose normal.     Mouth/Throat:     Mouth: Mucous membranes are moist.  Eyes:     Conjunctiva/sclera: Conjunctivae normal.      Pupils: Pupils are equal, round, and reactive to light.     Comments: Pupils are reactive, right gaze deviation  Cardiovascular:     Rate and Rhythm: Regular rhythm. Tachycardia present.     Pulses: Normal pulses.     Heart sounds: No murmur heard. Pulmonary:     Effort: Pulmonary effort is normal. No respiratory distress.     Breath sounds: Normal breath sounds.  Abdominal:     Palpations: Abdomen is soft.     Tenderness: There is no abdominal tenderness.  Musculoskeletal:     Cervical back: Neck supple.  Skin:    General: Skin is warm and dry.  Neurological:     Comments: Patient with right facial twitching, right gaze deviation, appears to move all extremities to painful stimuli, eyes are open spontaneously, patient not following commands or speaking    ED Results / Procedures / Treatments   Labs (all labs ordered are listed, but only abnormal results are displayed) Labs Reviewed  CBC WITH DIFFERENTIAL/PLATELET - Abnormal; Notable for the following components:      Result Value   RBC 5.89 (*)    MCV 77.9 (*)    MCH 25.8 (*)    All other components within normal limits  COMPREHENSIVE METABOLIC PANEL - Abnormal; Notable for the following components:   Glucose, Bld 103 (*)    All other components within normal limits  ETHANOL  RAPID URINE DRUG SCREEN, HOSP PERFORMED  CBG MONITORING, ED    EKG EKG Interpretation  Date/Time:  Friday February 24 2021 18:23:00 EDT Ventricular Rate:  97 PR Interval:  148 QRS Duration: 92 QT Interval:  372 QTC Calculation: 473 R Axis:   15 Text Interpretation: Sinus rhythm Biatrial enlargement Borderline T wave abnormalities Confirmed by [id number] ([id number]) on 02/24/2021 7:12:51 PM  Radiology CT Head Wo Contrast  Result Date: 02/24/2021 CLINICAL DATA:  Generalized anxiety EXAM: CT HEAD WITHOUT CONTRAST TECHNIQUE: Contiguous axial images were obtained from the base of the skull through the vertex without intravenous contrast.  COMPARISON:  10/16/2020 FINDINGS: Brain: No evidence of acute infarction, hemorrhage, hydrocephalus, extra-axial collection or mass lesion/mass effect. Periventricular and deep white matter hypodensity. Unchanged encephalomalacia of the left parietal lobe and anterior limb of the right internal capsule. Vascular: No hyperdense vessel or unexpected calcification. Skull: Normal. Negative for fracture or focal lesion. Sinuses/Orbits: No acute finding. Other: None. IMPRESSION: 1.  No acute intracranial pathology. 2. 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 Electronically Signed   By: 10/18/2020 M.D.   On: 02/24/2021 17:39  DG Chest Portable 1 View  Result Date: 02/24/2021 CLINICAL DATA:  Anxiety. EXAM: PORTABLE CHEST 1 VIEW COMPARISON:  Oct 16, 2020 FINDINGS: Decreased lung volumes are seen with mild areas of atelectasis noted within the bilateral lung bases. There is no evidence of a pleural effusion or pneumothorax. The cardiac silhouette is moderately enlarged. This may be technical in origin. The visualized skeletal structures are unremarkable. IMPRESSION: Low lung volumes with mild bibasilar atelectasis. Electronically Signed   By: Aram Candela M.D.   On: 02/24/2021 20:11    Procedures .Critical Care Performed by: Virgina Norfolk, DO Authorized by: Virgina Norfolk, DO   Critical care provider statement:    Critical care time (minutes):  45   Critical care was necessary to treat or prevent imminent or life-threatening deterioration of the following conditions:  CNS failure or compromise   Critical care was time spent personally by me on the following activities:  Blood draw for specimens, development of treatment plan with patient or surrogate, discussions with consultants, discussions with primary provider, evaluation of patient's response to treatment, examination of patient, obtaining history  from patient or surrogate, ordering and performing treatments and interventions, ordering and review of laboratory studies, ordering and review of radiographic studies, pulse oximetry, re-evaluation of patient's condition and review of old charts   Care discussed with: admitting provider     Medications Ordered in ED Medications  lacosamide (VIMPAT) 200 mg in sodium chloride 0.9 % 25 mL IVPB (has no administration in time range)  LORazepam (ATIVAN) injection 1 mg (1 mg Intravenous Given 02/24/21 1659)  LORazepam (ATIVAN) injection 1 mg (1 mg Intravenous Given 02/24/21 1722)  levETIRAcetam (KEPPRA) IVPB 1500 mg/ 100 mL premix (0 mg Intravenous Stopped 02/24/21 1818)  LORazepam (ATIVAN) injection 2 mg (2 mg Intravenous Given 02/24/21 1734)    ED Course  I have reviewed the triage vital signs and the nursing notes.  Pertinent labs & imaging results that were available during my care of the patient were reviewed by me and considered in my medical decision making (see chart for details).    MDM Rules/Calculators/A&P                           Arthur [name] is here for anxiety type symptoms per triage note however patient was brought back to the room from waiting room after he developed what appeared to be seizures.  He arrives to his bed with what appears to be right-sided facial twitching and right gaze deviation of his eyes.  He is nonresponsive.  This appears to be seizure-like activity.  This improved following 3 mg Ativan.  Still not back at his baseline.  Patient was tachycardic and not following commands during this event and suspect seizure.  Has a history of seizures and is on Vimpat and Keppra.  Family states that he has been compliant.  Former heavy alcohol user but has not drank in a long time.  Patient appears to no longer be having active seizures but neurology has been consulted and will try to obtain a stat EEG.  IV Keppra load has been given.  Lab work is overall unremarkable.  EKG shows  sinus rhythm.  CT of the head is unremarkable.  We will continue to monitor and await neurology recommendations.  Hemodynamically stable at this time.  Patient with status epilepticus as it has been over an hour and still not back at his baseline.  However he does appear  to be neurologically improving.  8:21 PM patient evaluated by neurology.  EEG is being performed.  He appears to be doing more purposeful things and becoming more alert but still postictal.  Will be loaded with Vimpat.  To be admitted to hospitalist service for further care.  This chart was dictated using voice recognition software.  Despite best efforts to proofread,  errors can occur which can change the documentation meaning.  Final Clinical Impression(s) / ED Diagnoses Final diagnoses:  Status epilepticus [facility])    Rx / DC Orders ED Discharge Orders     None        Virgina Norfolk, DO 02/24/21 2021

## 2021-02-24 NOTE — Progress Notes (Signed)
Stat  EEG complete - results pending.  

## 2021-02-25 DIAGNOSIS — R569 Unspecified convulsions: Secondary | ICD-10-CM | POA: Diagnosis not present

## 2021-02-25 DIAGNOSIS — G40901 Epilepsy, unspecified, not intractable, with status epilepticus: Secondary | ICD-10-CM | POA: Diagnosis not present

## 2021-02-25 LAB — URINALYSIS, ROUTINE W REFLEX MICROSCOPIC
Bacteria, UA: NONE SEEN
Bilirubin Urine: NEGATIVE
Glucose, UA: NEGATIVE mg/dL
Hgb urine dipstick: NEGATIVE
Ketones, ur: 5 mg/dL — AB
Leukocytes,Ua: NEGATIVE
Nitrite: NEGATIVE
Protein, ur: 100 mg/dL — AB
Specific Gravity, Urine: 1.024 (ref 1.005–1.030)
pH: 5 (ref 5.0–8.0)

## 2021-02-25 LAB — COMPREHENSIVE METABOLIC PANEL
ALT: 13 U/L (ref 0–44)
AST: 24 U/L (ref 15–41)
Albumin: 3.7 g/dL (ref 3.5–5.0)
Alkaline Phosphatase: 66 U/L (ref 38–126)
Anion gap: 11 (ref 5–15)
BUN: 18 mg/dL (ref 6–20)
CO2: 21 mmol/L — ABNORMAL LOW (ref 22–32)
Calcium: 9.1 mg/dL (ref 8.9–10.3)
Chloride: 105 mmol/L (ref 98–111)
Creatinine, Ser: 1.18 mg/dL (ref 0.61–1.24)
GFR, Estimated: >60 mL/min (ref >60)
Glucose, Bld: 82 mg/dL (ref 70–99)
Potassium: 4 mmol/L (ref 3.5–5.1)
Sodium: 137 mmol/L (ref 135–145)
Total Bilirubin: 1.2 mg/dL (ref 0.3–1.2)
Total Protein: 7 g/dL (ref 6.5–8.1)

## 2021-02-25 LAB — CBG MONITORING, ED
Glucose-Capillary: 101 mg/dL — ABNORMAL HIGH (ref 70–99)
Glucose-Capillary: 109 mg/dL — ABNORMAL HIGH (ref 70–99)
Glucose-Capillary: 93 mg/dL (ref 70–99)
Glucose-Capillary: 90 mg/dL (ref 70–99)
Glucose-Capillary: 123 mg/dL — ABNORMAL HIGH (ref 70–99)

## 2021-02-25 LAB — PHENYTOIN LEVEL, TOTAL: Phenytoin Lvl: 16 ug/mL (ref 10.0–20.0)

## 2021-02-25 LAB — CBC WITH DIFFERENTIAL/PLATELET
Abs Immature Granulocytes: 0.04 10*3/uL (ref 0.00–0.07)
Basophils Absolute: 0.1 10*3/uL (ref 0.0–0.1)
Basophils Relative: 0 %
Eosinophils Absolute: 0.1 10*3/uL (ref 0.0–0.5)
Eosinophils Relative: 1 %
HCT: 45.4 % (ref 39.0–52.0)
Hemoglobin: 15.3 g/dL (ref 13.0–17.0)
Immature Granulocytes: 0 %
Lymphocytes Relative: 19 %
Lymphs Abs: 2.2 10*3/uL (ref 0.7–4.0)
MCH: 25.9 pg — ABNORMAL LOW (ref 26.0–34.0)
MCHC: 33.7 g/dL (ref 30.0–36.0)
MCV: 76.9 fL — ABNORMAL LOW (ref 80.0–100.0)
Monocytes Absolute: 0.8 10*3/uL (ref 0.1–1.0)
Monocytes Relative: 7 %
Neutro Abs: 8.6 10*3/uL — ABNORMAL HIGH (ref 1.7–7.7)
Neutrophils Relative %: 73 %
Platelets: 274 10*3/uL (ref 150–400)
RBC: 5.9 MIL/uL — ABNORMAL HIGH (ref 4.22–5.81)
RDW: 13.7 % (ref 11.5–15.5)
WBC: 11.8 10*3/uL — ABNORMAL HIGH (ref 4.0–10.5)
nRBC: 0 % (ref 0.0–0.2)

## 2021-02-25 LAB — RAPID URINE DRUG SCREEN, HOSP PERFORMED
Amphetamines: NOT DETECTED
Barbiturates: NOT DETECTED
Benzodiazepines: POSITIVE — AB
Cocaine: NOT DETECTED
Opiates: NOT DETECTED
Tetrahydrocannabinol: NOT DETECTED

## 2021-02-25 LAB — MAGNESIUM: Magnesium: 1.9 mg/dL (ref 1.7–2.4)

## 2021-02-25 MED ORDER — LACOSAMIDE 200 MG PO TABS: 200.0000 mg | ORAL_TABLET | Freq: Two times a day (BID) | ORAL | Status: DC

## 2021-02-25 MED ORDER — LEVETIRACETAM 750 MG PO TABS: 1500.0000 mg | ORAL_TABLET | Freq: Two times a day (BID) | ORAL | Status: DC

## 2021-02-25 MED ORDER — SODIUM CHLORIDE 0.9 % IV SOLN
200.0000 mg | Freq: Two times a day (BID) | INTRAVENOUS | Status: DC
  Administered 2021-02-26 – 2021-02-27 (×4): 200 mg via INTRAVENOUS
  Filled 2021-02-25 (×5): qty 20

## 2021-02-25 MED ORDER — SODIUM CHLORIDE 0.9 % IV SOLN
1250.0000 mg | Freq: Once | INTRAVENOUS | Status: AC
  Administered 2021-02-25: 1250 mg via INTRAVENOUS
  Filled 2021-02-25: qty 25

## 2021-02-25 MED ORDER — CLONIDINE HCL 0.1 MG/24HR TD PTWK
0.1000 mg | MEDICATED_PATCH | TRANSDERMAL | Status: DC
  Administered 2021-02-25: 0.1 mg via TRANSDERMAL
  Filled 2021-02-25: qty 1

## 2021-02-25 MED ORDER — SODIUM CHLORIDE 0.9 % IV SOLN
100.0000 mg | INTRAVENOUS | Status: AC
  Administered 2021-02-25: 100 mg via INTRAVENOUS
  Filled 2021-02-25: qty 10

## 2021-02-25 MED ORDER — LEVETIRACETAM IN NACL 1500 MG/100ML IV SOLN
1500.0000 mg | Freq: Two times a day (BID) | INTRAVENOUS | Status: DC
  Administered 2021-02-25 – 2021-02-27 (×4): 1500 mg via INTRAVENOUS
  Filled 2021-02-25 (×5): qty 100

## 2021-02-25 MED ORDER — LEVETIRACETAM IN NACL 1000 MG/100ML IV SOLN
1000.0000 mg | Freq: Once | INTRAVENOUS | Status: AC
  Administered 2021-02-25: 1000 mg via INTRAVENOUS
  Filled 2021-02-25: qty 100

## 2021-02-25 MED ORDER — LORAZEPAM 2 MG/ML IJ SOLN: 1.0000 mg | Freq: Once | INTRAMUSCULAR | Status: AC

## 2021-02-25 MED ORDER — LORAZEPAM 2 MG/ML IJ SOLN
1.0000 mg | Freq: Once | INTRAMUSCULAR | Status: AC
  Administered 2021-02-25: 2 mg via INTRAVENOUS
  Filled 2021-02-25: qty 1

## 2021-02-25 NOTE — Progress Notes (Signed)
IM doctor notified pt is still lethargic from earlier dose of IV Ativan and unsafe to give his scheduled oral Seizure meds. Doctor to reach out to Neuro to check and see what they want to do

## 2021-02-25 NOTE — ED Notes (Signed)
Made multiple calls to all the numbers available for EEG to move equipment up and rehook pt up to machine, Was never able to contact anyone, left message, but have not received call back.

## 2021-02-25 NOTE — Procedures (Addendum)
Patient Name: Arthur [name]  MRN: [id number]  Epilepsy Attending: Charlsie Quest  Referring Physician/Provider: Dr Ritta Slot Duration: 02/24/2021 2137 to 02/25/2021 2137   Patient history: 60yo M with h/o epilepsy, presented with anxiety and had an episode of focal right-sided twitching, right gaze deviation and not following commands. EEG to evaluate for seizure   Level of alertness:  lethargic    AEDs during EEG study: LEV, LCM, PHT   Technical aspects: This EEG study was done with scalp electrodes positioned according to the 10-20 International system of electrode placement. Electrical activity was acquired at a sampling rate of 500Hz  and reviewed with a high frequency filter of 70Hz  and a low frequency filter of 1Hz . EEG data were recorded continuously and digitally stored.    Description: EEG initially showed continuous 2-3hz  rhythmic delta slowing in left hemisphere admixed with sharp waves in left posterior temporal region, maximal P7. At times there was evolution in frequency consistent with brief-ictal-interictal rhythmic discharges. Gradually the eeg pattern showed evolution in morphology ( appeared more sharply contoured admixed with sharp waves) as well as frequency ( fast 15-18hz  beta activity evolving into 2-3hz  rhythmic delta activity. Per review of notes, patient had trouble answering questions. 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   There is also 3-5hz  continuous generalized and maximal left posterior quadrant theta-delta slowing admixed with 15-18Hz  generalized beta activity. Hyperventilation and photic stimulation were not performed.      ABNORMALITY - Focal non convulsive status epilepticus, left posterior quadrant - Brief-ictal-interictal rhythmic discharges ( BIRD) , left posterior quadrant - Lateralized rhythmic delta activity, left hemisphere and maximal left posterior quadrant - Continuous slow, generalized and maximal left  posterior quadrant   IMPRESSION: This study initially showed evidence of epileptogenicity and cortical dysfunction arising from left posterior quadrant with brief-ictal-interictal rhythmic discharges. This EEG pattern is on the ictal-interictal continuum with high potential for seizures. Gradually eeg showed evolution in morphology and frequency.  Per review of notes, patient had trouble answering questions. 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    Additionally, there is also moderate diffuse encephalopathy, nonspecific etiology but likely related to seizure.    [name] 

## 2021-02-25 NOTE — Progress Notes (Signed)
Arthur [name]  Code Status: FULL Arthur [name] is a 60 y.o. male patient admitted from ED awake, alert - oriented X1 - no acute distress noted. VSS -  no c/o shortness of breath, no c/o chest pain. Cardiac tele in place. Call light within reach, patient able to voice, and demonstrate understanding. Skin, clean-dry- intact without evidence of bruising, or skin tears. Continuous EEG in place. No evidence of skin break down noted on exam.  ?  Will cont to eval and treat per MD orders.  Jon Gills, RN  02/25/2021

## 2021-02-25 NOTE — Progress Notes (Signed)
LTM maintenance   Event button tested w/[facility]  No skin breakdown noted at all skin sites

## 2021-02-25 NOTE — Progress Notes (Addendum)
Subjective:  O/N Events: Admitted  Patient evaluated bedside.  He is only able to answer yes or no questions, but answers are not appropriate.  When asked about orientating questions he responds with "I do not know."  Objective:  Vital signs in last 24 hours: Vitals:   02/25/21 0130 02/25/21 0300 02/25/21 0600 02/25/21 0700  BP: 116/79 (!) 153/101 (!) 140/101 (!) 143/111  Pulse: 89 89 94 86  Resp: 20 18 (!) 21 14  Temp:      TempSrc:      SpO2: 95% 98% 95% 96%  Weight:      Height:       Physical Exam Vitals reviewed.  Constitutional:      Appearance: He is not ill-appearing, toxic-appearing or diaphoretic.     Comments: Patient sitting in bed, appears in no acute distress, unable to answer questions appropriately.  Cardiovascular:     Rate and Rhythm: Normal rate and regular rhythm.     Pulses: Normal pulses.     Heart sounds: Normal heart sounds. No murmur heard.   No friction rub. No gallop.  Pulmonary:     Effort: Pulmonary effort is normal.     Breath sounds: Normal breath sounds. No wheezing, rhonchi or rales.  Abdominal:     General: Abdomen is flat. Bowel sounds are normal.     Palpations: Abdomen is soft.     Tenderness: There is no abdominal tenderness. There is no guarding.  Musculoskeletal:     Right lower leg: No edema.     Left lower leg: No edema.     Comments: Unable to assess strength as patient is unable to follow commands.    Neurological:     Comments: AAO x0, intermittently able to follow commands for neuro examination.  Smile is symmetric.  Does move extremities spontaneously     Assessment/Plan:  Principal Problem:   Seizures (HCC) Active Problems:   Essential hypertension   Acute encephalopathy   Type 2 diabetes mellitus (HCC)  Arthur [name] is a 60 y.o. male with medical history significant for seizures, essential hypertension, type 2 diabetes mellitus, with seizure after presenting from home to [facility] ED complaining of  anxiety.   Seizures Acute Metabolic Encephalopathy  Hx of Seizures:  Presentation likely in the setting of stress/anxiety after the loss of a family member. Does have remote Hx of ETOH use, but per chart review he has not had a drink in months.  UDS positive for benzodiazepines.  His CT negative was negative, EEG showing cortical dysfunction from L posterior quadrant.  Overnight EEG showing epileptogenicity and cortical dysfunction of the left posterior quadrant with brief ictal interictal rhythmic discharges concerning for to seizures. S/P 1500 mg Keppra load and IV Vimpat on admission. Started on fosphenytoin load this a.m.   -Started fosphenytoin load, followed by fosphenytoin 100 3 times daily - Keppra 500 mg BID - Vimpat 100 mg BID - Frequent neuro checks - Seizure precautions - Continue NPO until mentation improves  - Ativan 1 mg Q2H PRN for seizures - Appreciate Neurology's recommendations - F/U Ethanol - F/U O/N EEG results - F/U TSH  HTN:  - Amlodipine 5 mg daily  - Clonidine 0.1 mg BID  Type II Diabetes Mellitis:  On metformin outpatient.  CBGs continue to be controlled off sliding scale insulin.   - Trend CBGs  Leukocytosis:  WBC 11.8, U/A was clean, no fevers O/N. Likely in the setting of seizure. Will continue trending vitals and  CBC.  - Trend CBC  Prior to Admission Living Arrangement: Home Anticipated Discharge Location: Pending Barriers to Discharge:Continued Medical workup Dispo: Anticipated discharge in approximately 4-5 day(s).   Dolan Amen, MD 02/25/2021, 8:15 AM After 5pm on weekdays and 1pm on weekends: On Call pager [phone]

## 2021-02-25 NOTE — Consult Note (Signed)
NEUROLOGY CONSULTATION NOTE   Date of service: February 25, 2021 Patient Name: Arthur [name] MRN:  [id number] DOB:  [birth year] Reason for consult: "Seizure" Requesting Provider: Angie Fava, DO _ _ _   _ __   _ __ _ _  __ __   _ __   __ _  History of Present Illness  Arthur [name] is a 60 y.o. male with PMH significant for diabetes, hypertension, alcohol abuse history several years ago, history of seizures on Keppra and Vimpat at home who presented to the emergency department with anxiety.  He is significantly encephalopathic and somnolence at this time and I think this is likely postictal and he is therefore unable to provide any history.  Most of the history obtained by discussion with the ED team and the hospitalist team.  I attempted to contact his wife but was unfortunately unable to get in touch with her.  Reportedly patient was in [name] a few days ago to attend his father's funeral.  This is particularly been very stressful for him.  He was noted to have intermittent facial twitching while in [name].  He just returned to [name] a couple days ago from [name].  He came into the emergency department with anxiety.  In the ED he initially had an event with bilateral upper extremity shaking with maintained wakefulness and no loss of consciousness or change in mentation.   He then had another event with right facial twitching with poor responsiveness.  He was given a total of 4 mg of Ativan with resolution of the twitching but significant encephalopathy and somnolence.  He continues to be somnolent for a few hours and so we were asked to evaluate him.    ROS   Patient is unable to provide a detailed review of system due to postictal somnolence/significant encephalopathy.  Past History   Past Medical History:  Diagnosis Date   DM2 (diabetes mellitus, type 2) (HCC)    ETOH abuse    Hypertension    Seizures (HCC)    Past Surgical History:  Procedure Laterality Date    HEMORRHOID SURGERY     Family History  Problem Relation Age of Onset   Diabetes Mother    Hypertension Mother    Diabetes Father    Hypertension Father    Hypertension Sister    Social History   Socioeconomic History   Marital status: Married    Spouse name: Not on file   Number of children: Not on file   Years of education: Not on file   Highest education level: Not on file  Occupational History   Not on file  Tobacco Use   Smoking status: Every Day    Types: Cigars   Smokeless tobacco: Never   Tobacco comments:    Smokes black& mild - 1 a day.  Vaping Use   Vaping Use: Never used  Substance and Sexual Activity   Alcohol use: Not Currently   Drug use: Never   Sexual activity: Not on file  Other Topics Concern   Not on file  Social History Narrative   Right handed   Lives at home with wife    2-3 cups of cadd per day    Social Determinants of Health   Financial Resource Strain: Medium Risk   Difficulty of Paying Living Expenses: Somewhat hard  Food Insecurity: No Food Insecurity   Worried About Running Out of Food in the Last Year: Never true   Ran  Out of Food in the Last Year: Never true  Transportation Needs: No Transportation Needs   Lack of Transportation (Medical): No   Lack of Transportation (Non-Medical): No  Physical Activity: Not on file  Stress: Not on file  Social Connections: Not on file   Allergies  Allergen Reactions   Penicillins Hives    Did it involve swelling of the face/tongue/throat, SOB, or low BP? Y Did it involve sudden or severe rash/hives, skin peeling, or any reaction on the inside of your mouth or nose? N Did you need to seek medical attention at a hospital or doctor's office? Y When did it last happen?  Over 5 Years Ago     If all above answers are "NO", may proceed with cephalosporin use.     Medications  (Not in a hospital admission)    Vitals   Vitals:   02/24/21 2100 02/24/21 2300 02/24/21 2330 02/25/21 0000  BP:  (!) 170/123 (!) 155/99 (!) 158/89 (!) 135/91  Pulse:  85 88 89  Resp: (!) 25 20 (!) 21 20  Temp:      TempSrc:      SpO2:  97% 96% 96%  Weight:      Height:         Body mass index is 26.75 kg/m.  Physical Exam   General: Laying comfortably in bed; in no acute distress.  HENT: Normal oropharynx and mucosa. Normal external appearance of ears and nose.  Neck: Supple, no pain or tenderness  CV: No JVD. No peripheral edema.  Pulmonary: Symmetric Chest rise. Normal respiratory effort.  Abdomen: Soft to touch, non-tender.  Ext: No cyanosis, edema, or deformity  Skin: No rash. Normal palpation of skin.   Musculoskeletal: Normal digits and nails by inspection. No clubbing.   Neurologic Examination  Mental status/Cognition: Opens eyes to tactile stimulation and not to loud voice or clap.  He will briefly make eye contact before closing his eyes again.   Speech/language: Just said "what" after multiple attempts at trying to wake him up.  cranial nerves:   CN II Pupils equal and reactive to light, unable to assess for visual field deficit.   CN III,IV,VI EOM intact to dolls eyes, no gaze preference or deviation, no nystagmus    CN V + corneals   CN VII no asymmetry, no nasolabial fold flattening   CN VIII    CN IX & X    CN XI    CN XII    Motor/sensory:  Muscle bulk: normal, tone poor. BL upper extremities will slowly drift down when held up off the bed. Localizes in BL upper extremities to pinch. Withdraws BL lower extremities to babinski's reflex. Withdraws BL lower extremities to pinch.  Reflexes:  Right Left Comments  Pectoralis      Biceps (C5/6) 2 2   Brachioradialis (C5/6) 2 2    Triceps (C6/7) 2 2    Patellar (L3/4) 2 2    Achilles (S1)      Hoffman      Plantar     Jaw jerk    Coordination/Complex Motor:  Unable to assess given somnolence and poor participation with exam. - Gait: Unable to assess given somnolence and poor participation with exam.  Labs    CBC:  Recent Labs  Lab 02/24/21 1559  WBC 7.5  NEUTROABS 3.8  HGB 15.2  HCT 45.9  MCV 77.9*  PLT 276    Basic Metabolic Panel:  Lab Results  Component Value Date  NA 138 02/24/2021   K 4.0 02/24/2021   CO2 24 02/24/2021   GLUCOSE 103 (H) 02/24/2021   BUN 15 02/24/2021   CREATININE 1.20 02/24/2021   CALCIUM 9.1 02/24/2021   GFRNONAA >60 02/24/2021   GFRAA 95 01/11/2020   Lipid Panel:  Lab Results  Component Value Date   LDLCALC UNABLE TO CALCULATE IF TRIGLYCERIDE OVER 400 mg/dL 38/18/2993   ZJIR6V:  Lab Results  Component Value Date   HGBA1C 6.8 (H) 10/16/2020   Urine Drug Screen:     Component Value Date/Time   LABOPIA NONE DETECTED 10/02/2019 2100   COCAINSCRNUR NONE DETECTED 10/02/2019 2100   LABBENZ NONE DETECTED 10/02/2019 2100   AMPHETMU NONE DETECTED 10/02/2019 2100   THCU NONE DETECTED 10/02/2019 2100   LABBARB NONE DETECTED 10/02/2019 2100    Alcohol Level     Component Value Date/Time   ETH <10 10/16/2020 2232    CT Head without contrast: Personally reviewed and CTH was negative for a large hypodensity concerning for a large territory infarct or hyperdensity concerning for an ICH. 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  MRI Brain: pending  cEEG:  This study is suggestive of cortical dysfunction arising from left posterior quadrant likely secondary to underlying structural abnormality /stroke, post-ictal state.Of note, lateralized rhythmic delta activity seen in left hemisphere is also on the ictal-interictal continuum with low potential for seizure recurrence. Additionally, there is also moderate diffuse encephalopathy, nonspecific etiology but likely related to seizure. No seizures or definite epileptiform discharges were seen throughout the recording.  Impression   KANON NOVOSEL is a 59 y.o. male with PMH significant for for diabetes, hypertension, alcohol abuse history several years ago,  history of seizures on Keppra and Vimpat at home who presented to the emergency department with anxiety. Had a seizure in the ED with R face twitching and poor responsiveness. His neurologic examination is notable for significant somnolence and encephalopathy which I suspect is post ictal with no focal deficit.  Recommendations  -Loaded with Keppra 1500 mg IV once and Vimpat 200 mg IV once and continued on home Keppra 500 twice daily and increase Vimpat to 100 mg twice daily. -Seizure precautions. - Continues EEG overnight. - Ativan 1 to 2 mg for seizure lasting more than 3 minutes. ______________________________________________________________________  This patient is critically ill and at significant risk of neurological worsening, death and care requires constant monitoring of vital signs, hemodynamics,respiratory and cardiac monitoring, neurological assessment, discussion with family, other specialists and medical decision making of high complexity. I spent 35 minutes of neurocritical care time  in the care of  this patient. This was time spent independent of any time provided by nurse practitioner or PA.  Erick Blinks [facility] Pager Number [phone] 02/25/2021  12:58 AM   Thank you for the opportunity to take part in the care of this patient. If you have any further questions, please contact the neurology consultation attending.  Signed,  Erick Blinks [facility] Pager Number [phone] _ _ _   _ __   _ __ _ _  __ __   _ __   __ _

## 2021-02-25 NOTE — Progress Notes (Signed)
Patient continues to be densely confused.  He appears aphasic, Thelma Barge will ask him to wiggle his toes he lifts his legs, he does squeeze fingers to command.  His speech is difficult understand, he is answers yes/no questions but not always appropriately.  He has lateralized rhythmic delta activity that I feel has some evolution, and appears more like LPDs at times, I think this is on the ictal-interictal continuum.   Given his continued significant symptoms, I am concerned that he is continuing to have activity contributing to his deificitsand I will advance treatment.  I will add fosphenytoin, load with 20/kg, followed by PHT 100mg  TID.    , MD [facility] [phone]  If 7pm- 7am, please page neurology on call as listed in AMION.

## 2021-02-26 DIAGNOSIS — R569 Unspecified convulsions: Secondary | ICD-10-CM | POA: Diagnosis not present

## 2021-02-26 DIAGNOSIS — G40901 Epilepsy, unspecified, not intractable, with status epilepticus: Secondary | ICD-10-CM | POA: Diagnosis not present

## 2021-02-26 LAB — CBC
HCT: 45.1 % (ref 39.0–52.0)
Hemoglobin: 15.3 g/dL (ref 13.0–17.0)
MCH: 25.8 pg — ABNORMAL LOW (ref 26.0–34.0)
MCHC: 33.9 g/dL (ref 30.0–36.0)
MCV: 76.2 fL — ABNORMAL LOW (ref 80.0–100.0)
Platelets: 251 10*3/uL (ref 150–400)
RBC: 5.92 MIL/uL — ABNORMAL HIGH (ref 4.22–5.81)
RDW: 13.3 % (ref 11.5–15.5)
WBC: 9.4 10*3/uL (ref 4.0–10.5)
nRBC: 0 % (ref 0.0–0.2)

## 2021-02-26 LAB — MAGNESIUM: Magnesium: 2.3 mg/dL (ref 1.7–2.4)

## 2021-02-26 LAB — GLUCOSE, CAPILLARY
Glucose-Capillary: 126 mg/dL — ABNORMAL HIGH (ref 70–99)
Glucose-Capillary: 126 mg/dL — ABNORMAL HIGH (ref 70–99)
Glucose-Capillary: 155 mg/dL — ABNORMAL HIGH (ref 70–99)

## 2021-02-26 LAB — COMPREHENSIVE METABOLIC PANEL WITH GFR
ALT: 13 U/L (ref 0–44)
AST: 16 U/L (ref 15–41)
Albumin: 3.6 g/dL (ref 3.5–5.0)
Alkaline Phosphatase: 66 U/L (ref 38–126)
Anion gap: 11 (ref 5–15)
BUN: 16 mg/dL (ref 6–20)
CO2: 22 mmol/L (ref 22–32)
Calcium: 8.7 mg/dL — ABNORMAL LOW (ref 8.9–10.3)
Chloride: 104 mmol/L (ref 98–111)
Creatinine, Ser: 1.24 mg/dL (ref 0.61–1.24)
GFR, Estimated: >60 mL/min
Glucose, Bld: 111 mg/dL — ABNORMAL HIGH (ref 70–99)
Potassium: 3.7 mmol/L (ref 3.5–5.1)
Sodium: 137 mmol/L (ref 135–145)
Total Bilirubin: 1 mg/dL (ref 0.3–1.2)
Total Protein: 7.2 g/dL (ref 6.5–8.1)

## 2021-02-26 MED ORDER — PHENYTOIN SODIUM 50 MG/ML IJ SOLN
100.0000 mg | Freq: Three times a day (TID) | INTRAMUSCULAR | Status: DC
  Administered 2021-02-26 – 2021-02-27 (×4): 100 mg via INTRAVENOUS
  Filled 2021-02-26 (×3): qty 2

## 2021-02-26 MED ORDER — CLONIDINE HCL 0.1 MG PO TABS
0.1000 mg | ORAL_TABLET | Freq: Once | ORAL | Status: AC
  Administered 2021-02-26: 0.1 mg via ORAL
  Filled 2021-02-26: qty 1

## 2021-02-26 MED ORDER — PHENYTOIN SODIUM 50 MG/ML IJ SOLN
100.0000 mg | Freq: Once | INTRAMUSCULAR | Status: AC
  Administered 2021-02-26: 100 mg via INTRAVENOUS
  Filled 2021-02-26: qty 2

## 2021-02-26 MED ORDER — MAGNESIUM SULFATE 2 GM/50ML IV SOLN
2.0000 g | Freq: Once | INTRAVENOUS | Status: AC
  Administered 2021-02-26: 2 g via INTRAVENOUS
  Filled 2021-02-26: qty 50

## 2021-02-26 MED ORDER — PERAMPANEL 2 MG PO TABS
12.0000 mg | ORAL_TABLET | Freq: Once | ORAL | Status: AC
  Administered 2021-02-26: 12 mg via ORAL
  Filled 2021-02-26: qty 6

## 2021-02-26 MED ORDER — CLONAZEPAM 0.5 MG PO TABS
1.0000 mg | ORAL_TABLET | Freq: Two times a day (BID) | ORAL | Status: DC
  Administered 2021-02-26 – 2021-03-11 (×25): 1 mg via ORAL
  Filled 2021-02-26 (×26): qty 2

## 2021-02-26 MED ORDER — LACTATED RINGERS IV SOLN: INTRAVENOUS | Status: DC

## 2021-02-26 NOTE — Progress Notes (Signed)
LTM maintenance.  PT event button tested with [facility].  FP1 FP2 mild indentations in skin from leads. moved leads outwards 2cm.  F7 F8 no breakdown

## 2021-02-26 NOTE — Progress Notes (Addendum)
Subjective: No acute overnight events. Patient was seen at bedside during rounds today. Pt reports feeling well this AM. Compared to yesterday, pt appears to have made significant progress. He is no longer aphasic and sleepy. He is interactive and follows commands. He is oriented to person only. He is aware that he is in [place], but cannot tell me he is in the hospital.   No other complains or concerns at this time.   Objective:  Vital signs in last 24 hours: Vitals:   02/26/21 0735 02/26/21 1207 02/26/21 1542 02/26/21 1956  BP: (!) 175/121 (!) 176/108 (!) 173/106 (!) 161/109  Pulse: 91 90 96 84  Resp: 17 18 18 16   Temp: 98.6 F (37 C) 98.2 F (36.8 C) 98.1 F (36.7 C) 97.7 F (36.5 C)  TempSrc: Oral Oral Oral Oral  SpO2: 100% 99% 99% 100%  Weight:      Height:       Constitutional: alert, well-appearing, in no acute distress. He is sitting in bed and interacting with the team HENT: normocephalic, atraumatic, mucous membranes moist Eyes: conjunctiva non-erythematous, extraocular movements intact Cardiovascular: regular rate and rhythm, no m/r/g Pulmonary/Chest: normal work of breathing on room air, lungs clear to auscultation bilaterally Abdominal: soft, non-tender to palpation, non-distended MSK: normal bulk and tone Neurological: alert & oriented x 1 and is able to follow commands  Skin: warm and dry Psych: normal behavior, normal affect   Assessment/Plan:  Principal Problem:   Seizures (HCC) Active Problems:   Essential hypertension   Acute encephalopathy   Type 2 diabetes mellitus (HCC)  [name] is a 60 y.o. male with PMH significant for seizures, essential HTN, T2DM admitted for AMS secondary to seizures.   ON- none  Vitals- BP elevated sbp 160s, amlodipine 10 and clonidine 0.1 patch  Labs- CBC and CMP stable, with drop in Na 137 -> 133 decreased po intake, TSH wnl EEG  Non convulsive status Ellipticus Seizures Acute Metabolic Encephalopathy  Hx of  Seizures Presented in the setting of stress/anxiety after the loss of a family member. CT head negative, EEG showing cortical dysfunction from L posterior quadrant.  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 TSH wnl. Mental status significantly improved this morning, no longer aphasic and is able to follow commands, however is unable to describe why he is in the hospital. Anticipate continued improvement with medication adjustment.  - Neurology to load with IV phenobarbital 600 mg once  - Keppra 1500 po bid - Phenytoin 100 mg tid - Vimpat 200 mg bid - started perampanel 4 mg nightly, loaded with perampanel 12 mg yesterday  - Frequent neuro checks - Seizure precautions - Ativan 1 mg Q2H PRN for seizures - Appreciate Neurology's recommendations   HTN BP elevated sbp 160s. Will restart home 0.1 mg Clonidine bid for better BP control, as pt tolerating PO intake.  -Amlodipine 10 mg daily  -Clonidine 0.1 mg bid   T2DM:  On metformin outpatient. CBGs continue to be controlled on sliding scale insulin. A1c 6.0, 6.8 4 mo ago.   -Trend CBGs   Leukocytosis:  Peaked at 11.8 likely in setting of seizure, now resolved.  -Trend CBC  Best Practice: Diet: Carb-Modified IVF: LR,75cc/hr VTE: SCDs Code: Full  Signature: 41, MD  Internal Medicine Resident, PGY-1 [name]  Pager: [phone] After 5pm on weekdays and 1pm on weekends: On Call pager  [phone]  

## 2021-02-26 NOTE — Progress Notes (Signed)
Subjective: Patient subjectively feels that his speech is improving, but I am not so certain.  Exam: Vitals:   02/26/21 0342 02/26/21 0735  BP: (!) 172/110 (!) 175/121  Pulse: 91 91  Resp: 18 17  Temp: 98 F (36.7 C) 98.6 F (37 C)  SpO2:  100%   Gen: In bed, NAD Resp: non-labored breathing, no acute distress Abd: soft, nt  Neuro: MS: He is awake, alert, he has a primarily expressive aphasia as he is able to follow commands readily.  He appears to answer yes/no questions relatively accurately, but has severe difficulty with naming objects.  When shown the "cookie caper" photograph from the NIH stroke scale, he is able to say "I guess he wants a cookie" but does not acknowledge the right side of the photograph. CN: He does not cooperate well with finger counting and does not blink to threat from either side, I suspect he may have a right field cut but I am not sure.  No facial droop Motor: No drift in any extremity Sensory: Reports symmetric sensation to light touch  Pertinent Labs: Magnesium 1.9 from yesterday Sodium 137 Calcium 8.7 Albumin 3.6  Impression: 60 year old male with persistent aphasia after right facial twitching.  He has an ictal-interictal pattern on his EEG, but with some suggestion of evolution make me strongly suspect that this was an ictal pattern.  That makes this consistent with simple partial status epilepticus, but with aphasia as the symptom I favor being slightly more aggressive than sometimes is done with epilepsia partialis continua.  He was noted to be more lethargic overnight, but I suspect this is treatment effect and he is easily awakened and readily interactive on my exam this morning.  Recommendations: 1) continue Keppra 1500 twice daily 2) continue phenytoin 100 3 times daily, will give an additional 100 mg this morning as he did not have a dose last night and his level was 16 at that time, level tomorrow 3) continue Vimpat 200 mg twice daily 4)  add perampanel 12 mg x 1 followed by 2 mg daily 5) we will consider scheduled adding benzodiazepines next if no response by this afternoon.  I have spent in excess of 35 minutes of time with the patient including chart review, face-to-face interaction, assessment of diagnostic studies (including reviewing EEG), and reassessing the patient for treatment effect.  Ritta Slot, MD [facility] [phone]  If 7pm- 7am, please page neurology on call as listed in AMION.

## 2021-02-26 NOTE — Procedures (Addendum)
Patient Name: [name]  MRN: [id number]  Epilepsy Attending: Charlsie Quest  Referring Physician/Provider: Dr Ritta Slot Duration: 02/25/2021 2137 to 02/26/2021 2137   Patient history: 60yo M with h/o epilepsy, presented with anxiety and had an episode of focal right-sided twitching, right gaze deviation and not following commands. EEG to evaluate for seizure   Level of alertness:  lethargic    AEDs during EEG study: LEV, LCM, PHT, perampanel, clonazepam     Technical aspects: This EEG study was done with scalp electrodes positioned according to the 10-20 International system of electrode placement. Electrical activity was acquired at a sampling rate of 500Hz  and reviewed with a high frequency filter of 70Hz  and a low frequency filter of 1Hz . EEG data were recorded continuously and digitally stored.    Description: EEG showed continuous 2-3hz  rhythmic delta slowing in left hemisphere admixed with sharp waves in left posterior temporal region, maximal P7 with evolution in morphology ( appears more sharply contoured) as well as frequency ( fast 15-18hz  beta activity evolving into 2-3hz  rhythmic delta activity).  Per review of notes, patient had trouble answering questions. 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    There is also 3-5hz  continuous generalized 3-5hz  theta-delta slowing admixed with 15-18Hz  generalized beta activity. Hyperventilation and photic stimulation were not performed.     Of note, parts of study between 02/26/2021 1518 to 1749 were not interpretable as patient pulled off his leads.    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 Additionally, there is also moderate diffuse encephalopathy, nonspecific etiology but likely related to seizure.      [name] 

## 2021-02-26 NOTE — Progress Notes (Signed)
Subjective:  O/N Events: Patient revied ativan 2mg  overnight due to continued confusion and concern seizure on EEG. unable to take PO medications due to mental status after getting 2 MG ativan, medications switched to IV Patient sleepy on exam and remains confused. Will say yes and answer questions but remains very confused.   Objective:  Vital signs in last 24 hours: Vitals:   02/25/21 1950 02/25/21 1954 02/25/21 2329 02/26/21 0342  BP: (!) 165/104 (!) 161/116 (!) 168/110 (!) 172/110  Pulse: 93  90 91  Resp:   19 18  Temp: 98.7 F (37.1 C)  97.6 F (36.4 C) 98 F (36.7 C)  TempSrc: Axillary  Oral Oral  SpO2: 99%     Weight:      Height:       Physical Exam Vitals reviewed.  Constitutional:      Appearance: He is not ill-appearing, toxic-appearing or diaphoretic.     Comments: Patient sitting in bed arms crossed over chest, sleeping, alerts to voice  HENT:     Head:     Comments: EEG electrodes in place Cardiovascular:     Rate and Rhythm: Normal rate and regular rhythm.     Pulses: Normal pulses.     Heart sounds: Normal heart sounds. No murmur heard.   No friction rub. No gallop.  Pulmonary:     Effort: Pulmonary effort is normal.     Breath sounds: Normal breath sounds. No wheezing, rhonchi or rales.  Abdominal:     General: Abdomen is flat. Bowel sounds are normal.     Palpations: Abdomen is soft.     Tenderness: There is no abdominal tenderness. There is no guarding.  Musculoskeletal:     Right lower leg: No edema.     Left lower leg: No edema.     Comments: Unable to assess strength as patient is unable to follow commands.    Neurological:     Comments: AAO x0, not following commands. Lethargic, poor attention, moving extremities.    Assessment/Plan:  Principal Problem:   Seizures (HCC) Active Problems:   Essential hypertension   Acute encephalopathy   Type 2 diabetes mellitus (HCC)  Arthur [name] is a 60 y.o. male with medical history significant for  seizures, essential hypertension, type 2 diabetes mellitus, with seizure after presenting from home to [facility] ED complaining of anxiety and admitted for AMS secondary to seizures.   Seizures Acute Metabolic Encephalopathy  Hx of Seizures:  Presentation likely in the setting of stress/anxiety after the loss of a family member. Does have remote Hx of ETOH use, but per chart review he has not had a drink in months. UDS positive for benzodiazepines.  His CT negative was negative, EEG showing cortical dysfunction from L posterior quadrant.  Overnight EEG showing epileptogenicity and cortical dysfunction of the left posterior quadrant with brief ictal interictal rhythmic discharges concerning for simple partial status epilepticus. Mental status has not improved this morning. Remains confused and aphasic. Unable to follow commands or provide reliable history. Given additional phenytoin this morning  due to low level this morning. -Started fosphenytoin load, followed by fosphenytoin 100 3 times daily - Keppra 1500 IV twice daily  - Vimpat 200 mg twice daily - Phenytoin 100 mg three times daily - perampanel 12 mg once, then 2 mg daily - Frequent neuro checks - Seizure precautions - Ativan 1 mg Q2H PRN for seizures - Appreciate Neurology's recommendations - F/U O/N EEG results - F/U TSH  HTN:  BP elevated in 170s/100s, was switched to from clonidine 0.1 BID to o.1 mg patch due to decreased mental status. Tolerating PO will give one dose of clonidine 0.1 mg PO now and switch back to home dose if he is continue to be doing well with PO intake.  - Amlodipine 10 mg daily  - Clonidine 0.1 mg patch  Type II Diabetes Mellitis:  On metformin outpatient.  CBGs continue to be controlled off sliding scale insulin.   - Trend CBGs  Leukocytosis:  Peaked at 11.8 likely in setting of seizure. Now resolved at 9.4. this morning. - Trend CBC  Prior to Admission Living Arrangement: Home Anticipated Discharge  Location: Pending Barriers to Discharge:Continued Medical workup Dispo: Anticipated discharge in approximately 4-5 day(s).   [name], MD 02/26/2021, 6:19 AM After 5pm on weekdays and 1pm on weekends: On Call pager [phone]

## 2021-02-26 NOTE — Hospital Course (Addendum)
Arthur [name] is a 60 y.o. male with medical history significant for seizures, essential hypertension, type 2 diabetes mellitus admitted for AMS secondary to seizures.    Non convulsive status Ellipticus, resolved Acute Metabolic Encephalopathy  Hx of Seizures:  Presentation likely in the setting of stress/anxiety after the loss of a family member. Does have remote Hx of ETOH use, but per chart review he has not had a drink in months. UDS positive for benzodiazepines.CT head negative, EEG showing cortical dysfunction from L posterior quadrant. TSH wnl. Loaded with keppra and fosphenyotion, requiring titration of several AEDs including keppra, vimpat, phenytoin, perampanel and prn ativan by neurology. 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 LTM EEG done for 3 days until event free, followed by brain MRI, which does not show any acute changes that could explain the ongoing expressive aphasia. Ongoing expressive aphasia likely combination of sedative agents and post ictal state, and can take weeks to months to improve and determine new post status epilepticus baseline. Pt remained seizure free for 2-3 days, so neurology started phenytoin and parampanel taper. Will receive speech therapy at [facility]. Pt to follow up with neurology in outpatient setting.   Final neurology recommendations include:  - CONTINUE Keppra 1500 mg twice daily and Vimpat 200 mg twice daily.   FINISH phenytoin and parampanel taper as instructed: - Perampanel 4 mg for 5 days, followed by perampanel 2 mg for 10 days. - Phenytoin 50 mg three times a day for 5 days, THEN Phenytoin 50 mg  two times a day for 5 days, THEN Phenytoin 50 mg one time a day for 5 days UNTIL complete.   PLEASE continue taking all other medications as prescribed.   Pt discharged to [facility].    HTN SBPs stable at 140-150s on home  medications.  - Amlodipine 10 mg daily  - Clonidine 0.1 mg bid - Started lisinopril 10 on day of d/c to better control BP. Please follow up with a BMP in 2 weeks for Cr levels.  T2DM  On metformin outpatient. CBGs continue to be controlled on SSI.   - Trend CBGs   Leukocytosis:  Peaked at 11.8 likely in setting of seizure. Resolved.  - Trended CBC

## 2021-02-26 NOTE — Plan of Care (Signed)
Pt has been lethargic all shift. Has awakened briefly a couple of times and attempted to get bilat hand mitts off. Meds changed last night back to IV r/t lethargy. Pt didn't follow any commands last night and the only thing he said clearly was his name.

## 2021-02-26 NOTE — Progress Notes (Signed)
Patient pulled off mitts and EEG leads twice this shift. Order received by Dr. Amada Jupiter for bilateral soft wrist restraints. Spoke with wife, Arthur [name] and explained purpose of restraints. Patient currently resting, no complaints at this time.

## 2021-02-27 ENCOUNTER — Other Ambulatory Visit (HOSPITAL_BASED_OUTPATIENT_CLINIC_OR_DEPARTMENT_OTHER): Admitting: Internal Medicine

## 2021-02-27 ENCOUNTER — Telehealth (INDEPENDENT_AMBULATORY_CARE_PROVIDER_SITE_OTHER): Admitting: Internal Medicine

## 2021-02-27 DIAGNOSIS — R569 Unspecified convulsions: Secondary | ICD-10-CM

## 2021-02-27 DIAGNOSIS — G40901 Epilepsy, unspecified, not intractable, with status epilepticus: Secondary | ICD-10-CM | POA: Diagnosis not present

## 2021-02-27 LAB — CBC
HCT: 42.7 % (ref 39.0–52.0)
Hemoglobin: 14.4 g/dL (ref 13.0–17.0)
MCH: 25.7 pg — ABNORMAL LOW (ref 26.0–34.0)
MCHC: 33.7 g/dL (ref 30.0–36.0)
MCV: 76.1 fL — ABNORMAL LOW (ref 80.0–100.0)
Platelets: 233 10*3/uL (ref 150–400)
RBC: 5.61 MIL/uL (ref 4.22–5.81)
RDW: 13.3 % (ref 11.5–15.5)
WBC: 7.9 10*3/uL (ref 4.0–10.5)
nRBC: 0 % (ref 0.0–0.2)

## 2021-02-27 LAB — GLUCOSE, CAPILLARY
Glucose-Capillary: 126 mg/dL — ABNORMAL HIGH (ref 70–99)
Glucose-Capillary: 102 mg/dL — ABNORMAL HIGH (ref 70–99)
Glucose-Capillary: 124 mg/dL — ABNORMAL HIGH (ref 70–99)

## 2021-02-27 LAB — COMPREHENSIVE METABOLIC PANEL
ALT: 11 U/L (ref 0–44)
AST: 16 U/L (ref 15–41)
Albumin: 3.2 g/dL — ABNORMAL LOW (ref 3.5–5.0)
Alkaline Phosphatase: 60 U/L (ref 38–126)
Anion gap: 7 (ref 5–15)
BUN: 11 mg/dL (ref 6–20)
CO2: 22 mmol/L (ref 22–32)
Calcium: 8.4 mg/dL — ABNORMAL LOW (ref 8.9–10.3)
Chloride: 104 mmol/L (ref 98–111)
Creatinine, Ser: 1.07 mg/dL (ref 0.61–1.24)
GFR, Estimated: >60 mL/min (ref >60)
Glucose, Bld: 112 mg/dL — ABNORMAL HIGH (ref 70–99)
Potassium: 3.8 mmol/L (ref 3.5–5.1)
Sodium: 133 mmol/L — ABNORMAL LOW (ref 135–145)
Total Bilirubin: 0.7 mg/dL (ref 0.3–1.2)
Total Protein: 6.8 g/dL (ref 6.5–8.1)

## 2021-02-27 LAB — PHENYTOIN LEVEL, TOTAL: Phenytoin Lvl: 11.2 ug/mL (ref 10.0–20.0)

## 2021-02-27 LAB — HEMOGLOBIN A1C
Hgb A1c MFr Bld: 6 % — ABNORMAL HIGH (ref 4.8–5.6)
Mean Plasma Glucose: 125.5 mg/dL

## 2021-02-27 LAB — TSH: TSH: 1.377 u[IU]/mL (ref 0.350–4.500)

## 2021-02-27 MED ORDER — LACOSAMIDE 200 MG PO TABS
200.0000 mg | ORAL_TABLET | Freq: Two times a day (BID) | ORAL | Status: DC
  Administered 2021-02-27 – 2021-03-11 (×24): 200 mg via ORAL
  Filled 2021-02-27 (×24): qty 1

## 2021-02-27 MED ORDER — SODIUM CHLORIDE 0.9 % IV SOLN
700.0000 mg | Freq: Once | INTRAVENOUS | Status: AC
  Administered 2021-02-27: 700 mg via INTRAVENOUS
  Filled 2021-02-27: qty 5.38

## 2021-02-27 MED ORDER — MIDAZOLAM HCL 2 MG/2ML IJ SOLN: 2.0000 mg | INTRAMUSCULAR | Status: DC | PRN

## 2021-02-27 MED ORDER — PHENYTOIN 50 MG PO CHEW
100.0000 mg | CHEWABLE_TABLET | Freq: Three times a day (TID) | ORAL | Status: DC
  Administered 2021-02-27 – 2021-03-05 (×20): 100 mg via ORAL
  Filled 2021-02-27 (×22): qty 2

## 2021-02-27 MED ORDER — LEVETIRACETAM 750 MG PO TABS
1500.0000 mg | ORAL_TABLET | Freq: Two times a day (BID) | ORAL | Status: DC
  Administered 2021-02-27 – 2021-03-11 (×24): 1500 mg via ORAL
  Filled 2021-02-27 (×24): qty 2

## 2021-02-27 MED ORDER — PHENYTOIN SODIUM EXTENDED 100 MG PO CAPS: 100.0000 mg | ORAL_CAPSULE | Freq: Three times a day (TID) | ORAL | Status: DC

## 2021-02-27 MED ORDER — CLONIDINE HCL 0.1 MG PO TABS
0.1000 mg | ORAL_TABLET | Freq: Two times a day (BID) | ORAL | Status: DC
  Administered 2021-02-27 – 2021-03-11 (×24): 0.1 mg via ORAL
  Filled 2021-02-27 (×25): qty 1

## 2021-02-27 MED ORDER — PERAMPANEL 2 MG PO TABS
4.0000 mg | ORAL_TABLET | Freq: Every day | ORAL | Status: DC
  Administered 2021-02-27 – 2021-03-05 (×7): 4 mg via ORAL
  Filled 2021-02-27 (×7): qty 2

## 2021-02-27 NOTE — Plan of Care (Signed)
Pt more alert tonight but remains only oriented to self and once in a while that he is in the hospital. Pt stated this morning he was hungry and couldn't wait for breakfast. Remains in bilat wrist restraints to prevent him from taking the EEG Leads off again.

## 2021-02-27 NOTE — Telephone Encounter (Signed)
 Dr. Chestine Spore please advise.    Procedure: Colonoscopy  Indication: Screen  Date: Mon 05/08/2021  11:30/10:30  Doctor:  Chestine Spore  Location:  [facility]    IS PATIENT COVID VACCINATED?: Y  Advised to bring documentation    NO Pacemaker/AICD   NO Diabetic meds   NO blood thinners   NO OSA/sleep apnea  NO BMI >45  NO immune suppressants/biologics  NO Positive COVID in last 90 days  NO need for transportation    Pharmacy: [facility] [address] [place], [place]

## 2021-02-27 NOTE — Telephone Encounter (Signed)
 Patient calls requesting to schedule routine 85yr colo.     States he believes he was seen here before, but unsure of the provider.

## 2021-02-27 NOTE — Progress Notes (Signed)
   Subjective: No acute overnight events. Patient was seen at bedside during rounds today. Pt reports feeling well this AM, though he is drowsy. He is mildly interactive but is able to follows commands. He is oriented to person only. He is unable to state that he is in the hospital.   No other complains or concerns at this time.   Objective:  Vital signs in last 24 hours: Vitals:   02/27/21 0333 02/27/21 0812 02/27/21 0822 02/27/21 1225  BP: (!) 172/135 (!) 164/129 (!) 162/108 (!) 151/108  Pulse: 78 84  79  Resp: 19 19  19   Temp: 97.6 F (36.4 C) 98.1 F (36.7 C)  97.8 F (36.6 C)  TempSrc: Oral Oral  Oral  SpO2: 100% 100%  99%  Weight:      Height:       Constitutional: alert, well-appearing, in no acute distress. He is sitting in bed and interacting with the team HENT: normocephalic, atraumatic, mucous membranes moist Eyes: conjunctiva non-erythematous, extraocular movements intact Cardiovascular: regular rate and rhythm, no m/r/g Pulmonary/Chest: normal work of breathing on room air, lungs clear to auscultation bilaterally Abdominal: soft, non-tender to palpation, non-distended MSK: normal bulk and tone Neurological: alert & oriented x 1 and is able to follow commands  Skin: warm and dry Psych: normal behavior, normal affect   Assessment/Plan:  Principal Problem:   Seizures (HCC) Active Problems:   Essential hypertension   Acute encephalopathy   Type 2 diabetes mellitus (HCC)  Arthur [name] is a 60 y.o. male with PMH significant for seizures, essential HTN, T2DM admitted for AMS secondary to seizures.    Non convulsive status Ellipticus Seizures Acute Metabolic Encephalopathy  Hx of Seizures EEG consistent with focal non-convulsive status epilepticus arising from left, and moderate diffuse encephalopathy nonspecific etiology but likely related to seizure. Pt is more drowsy compared to yesterday, given adjustments to AEDs yesterday. Mental status remains stable as  he is able to minimally converse and follow commands. He remains oriented to self only. Neurology following to titrate AEDs. Will continue to monitor, and if patient becomes lethargic, will consider intubation.  - Keppra 1500 po bid - Phenytoin 100 mg tid - Vimpat 200 mg bid - perampanel 4 mg nightly - Frequent neuro checks - Seizure precautions - Versed 2 mg Q4H PRN for seizures - Appreciate Neurology's recommendations   HTN Resumed home dose BP medications. Isolated BP 195/119 reading at 9 am, however overall BP improving with most recent BP 116/87. Will continue current regimen, and continue to monitor.   -Amlodipine 10 mg daily  -Clonidine 0.1 mg bid   T2DM:  On metformin outpatient. CBGs continue to be controlled on sliding scale insulin. A1c 6.0, 6.8 4 mo ago.   -Trend CBGs   Leukocytosis:  Peaked at 11.8 likely in setting of seizure, now resolved.  -Trend CBC  Best Practice: Diet: Carb-Modified IVF: LR,75cc/hr VTE: SCDs Code: Full  Signature: 41, MD  Internal Medicine Resident, PGY-1 [name]  Pager: [phone] After 5pm on weekdays and 1pm on weekends: On Call pager [phone]

## 2021-02-27 NOTE — Plan of Care (Signed)
°  Problem: Clinical Measurements: °Goal: Ability to maintain clinical measurements within normal limits will improve °Outcome: Progressing °Goal: Will remain free from infection °Outcome: Progressing °Goal: Diagnostic test results will improve °Outcome: Progressing °Goal: Respiratory complications will improve °Outcome: Progressing °Goal: Cardiovascular complication will be avoided °Outcome: Progressing °  °Problem: Coping: °Goal: Level of anxiety will decrease °Outcome: Progressing °  °

## 2021-02-27 NOTE — Progress Notes (Signed)
Subjective: Has continued to have seizures overnight.  Patient states he is feeling well and is unaware of the seizures.  Denies any concerns.  ROS: negative except above  Examination  Vital signs in last 24 hours: Temp:  [97.6 F (36.4 C)-98.9 F (37.2 C)] 98.1 F (36.7 C) (10/03 1829) Pulse Rate:  [78-96] 84 (10/03 0812) Resp:  [16-19] 19 (10/03 0812) BP: (160-176)/(106-135) 162/108 (10/03 0822) SpO2:  [99 %-100 %] 100 % (10/03 0812)  General: lying in bed, not in apparent distress CVS: pulse-normal rate and rhythm RS: breathing comfortably, CTAB Extremities: Warm, in mittens Neuro: Awake, alert, oriented only to self not to place or person, follows simple one-step commands after repetition, unable to name objects, spontaneously moving all 4 extremities (unable to follow complex commands to test motor strength)   Basic Metabolic Panel: Recent Labs  Lab 02/24/21 1559 02/25/21 0525 02/26/21 0234 02/26/21 1923 02/27/21 0244  NA 138 137 137  --  133*  K 4.0 4.0 3.7  --  3.8  CL 105 105 104  --  104  CO2 24 21* 22  --  22  GLUCOSE 103* 82 111*  --  112*  BUN 15 18 16   --  11  CREATININE 1.20 1.18 1.24  --  1.07  CALCIUM 9.1 9.1 8.7*  --  8.4*  MG  --  1.9  --  2.3  --     CBC: Recent Labs  Lab 02/24/21 1559 02/25/21 0525 02/26/21 0234 02/27/21 0244  WBC 7.5 11.8* 9.4 7.9  NEUTROABS 3.8 8.6*  --   --   HGB 15.2 15.3 15.3 14.4  HCT 45.9 45.4 45.1 42.7  MCV 77.9* 76.9* 76.2* 76.1*  PLT 276 274 251 233     Coagulation Studies: No results for input(s): LABPROT, INR in the last 72 hours.  Imaging No new brain imaging overnight  ASSESSMENT AND PLAN: 60 year old male with history of epilepsy who presented with status epilepticus.   Focal nonconvulsive status epilepticus, refractory Aphasia, due to status epilepticus Hyponatremia Hypoalbuminemia -Patient presented with convulsive focal status epilepticus which has improved but continues to be in nonconvulsive  status epilepticus.  The main symptom of his status epilepticus is expressive aphasia.  -Etiology of breakthrough seizures: No evidence of infection, drug levels were not tested.  Patient did attend his father's funeral prior to the breakthrough seizures and it is possible that stress was the etiology  Recommendations -We will load with IV phenobarbital 600 mg once -We will start perampanel 4 mg nightly for maintenance (was loaded with perampanel 12 mg once yesterday) -Continue Keppra 1500 mg twice daily, Vimpat 200 mg twice daily and Dilantin 100 mg every 8 hours (total phenytoin level 16 on 02/25/2021) -Focal nonconvulsive status epilepticus has been difficult to control.  We are trying to avoid intubation as patient is otherwise awake, alert and following commands and intubating such patients does not always lead to better outcomes.  -Therefore, as long as patient is awake and alert we will continue to titrate AEDs.  However, if patient becomes lethargic, will consider intubation and sedation -Continue seizure precautions -as needed IV Versed 2 mg as needed for clinical seizure lasting more than 5 minutes.  Avoiding Ativan due to national shortage -Management of rest of comorbidities per primary team  CRITICAL CARE Performed by: 04/27/2021   Total critical care time: 35 minutes  Critical care time was exclusive of separately billable procedures and treating other patients.  Critical care was necessary  to treat or prevent imminent or life-threatening deterioration.  Critical care was time spent personally by me on the following activities: development of treatment plan with patient and/or surrogate as well as nursing, discussions with consultants, evaluation of patient's response to treatment, examination of patient, obtaining history from patient or surrogate, ordering and performing treatments and interventions, ordering and review of laboratory studies, ordering and review of  radiographic studies, pulse oximetry and re-evaluation of patient's condition.  Lindie Spruce Epilepsy [facility] For questions after 5pm please refer to AMION to reach the Neurologist on call

## 2021-02-27 NOTE — Procedures (Addendum)
Patient Name: Arthur [name]  MRN: [id number]  Epilepsy Attending: Charlsie Quest  Referring Physician/Provider: Dr Ritta Slot Duration: 02/26/2021 2137 to 02/27/2021 2137   Patient history: 60yo M with h/o epilepsy, presented with anxiety and had an episode of focal right-sided twitching, right gaze deviation and not following commands. EEG to evaluate for seizure   Level of alertness:  lethargic    AEDs during EEG study: LEV, LCM, PHT, perampanel, clonazepam , phenobarb   Technical aspects: This EEG study was done with scalp electrodes positioned according to the 10-20 International system of electrode placement. Electrical activity was acquired at a sampling rate of 500Hz  and reviewed with a high frequency filter of 70Hz  and a low frequency filter of 1Hz . EEG data were recorded continuously and digitally stored.    Description: EEG initially showed continuous 2-3hz  rhythmic delta slowing in left hemisphere admixed with sharp waves in left posterior temporal region, maximal P7 with evolution in morphology ( appears more sharply contoured) as well as frequency ( fast 15-18hz  beta activity evolving into 2-3hz  rhythmic delta activity).  Per review of notes, patient had trouble answering questions. 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 Iv phenobarb was administered at around 1423 on 02/27/2021. After this eeg showed quasi-pled like sharp waves in left posterior quadrant with fluctuating frequency between 1-1.5Hz    There is also 3-5hz  continuous generalized 3-5hz  theta-delta slowing admixed with 15-18Hz  generalized beta activity. Hyperventilation and photic stimulation were not performed.       ABNORMALITY - Focal non convulsive status epilepticus, left posterior quadrant - Continuous slow, generalized    IMPRESSION: This study was initially consistent with focal non convulsive status epilepticus arising from left, seizures. After administration of IV  phenobarb at 1423 on 02/27/2021.  EEGshowed evidence of epileptogenicity arising from left posterior quadrant but no definite seizures were seen. Additionally, there was also moderate diffuse encephalopathy, nonspecific etiology but likely related to seizure.     [name] 

## 2021-02-27 NOTE — Progress Notes (Signed)
EEG LTM maintance done . Impedance good. Marker button tested. No skin breakdown noted.

## 2021-02-28 ENCOUNTER — Encounter (INDEPENDENT_AMBULATORY_CARE_PROVIDER_SITE_OTHER): Admitting: Internal Medicine

## 2021-02-28 DIAGNOSIS — R569 Unspecified convulsions: Secondary | ICD-10-CM | POA: Diagnosis not present

## 2021-02-28 LAB — COMPREHENSIVE METABOLIC PANEL
ALT: 11 U/L (ref 0–44)
AST: 15 U/L (ref 15–41)
Albumin: 3 g/dL — ABNORMAL LOW (ref 3.5–5.0)
Alkaline Phosphatase: 66 U/L (ref 38–126)
Anion gap: 10 (ref 5–15)
BUN: 9 mg/dL (ref 6–20)
CO2: 22 mmol/L (ref 22–32)
Calcium: 8.7 mg/dL — ABNORMAL LOW (ref 8.9–10.3)
Chloride: 104 mmol/L (ref 98–111)
Creatinine, Ser: 0.93 mg/dL (ref 0.61–1.24)
GFR, Estimated: >60 mL/min (ref >60)
Glucose, Bld: 98 mg/dL (ref 70–99)
Potassium: 4.1 mmol/L (ref 3.5–5.1)
Sodium: 136 mmol/L (ref 135–145)
Total Bilirubin: 0.4 mg/dL (ref 0.3–1.2)
Total Protein: 6.4 g/dL — ABNORMAL LOW (ref 6.5–8.1)

## 2021-02-28 LAB — GLUCOSE, CAPILLARY
Glucose-Capillary: 101 mg/dL — ABNORMAL HIGH (ref 70–99)
Glucose-Capillary: 105 mg/dL — ABNORMAL HIGH (ref 70–99)
Glucose-Capillary: 83 mg/dL (ref 70–99)

## 2021-02-28 MED ORDER — PHENOBARBITAL 32.4 MG PO TABS
64.8000 mg | ORAL_TABLET | Freq: Every day | ORAL | Status: DC
  Administered 2021-02-28: 64.8 mg via ORAL
  Filled 2021-02-28: qty 2

## 2021-02-28 NOTE — Progress Notes (Signed)
Pt pulled all leads off during the night. Re hooked patient: LTM EEG hooked up and running - skin irritation on forehead. Moved electrodes FP1 FP2 slightly - push button tested - neuro notified. [facility] monitoring.

## 2021-02-28 NOTE — Progress Notes (Signed)
Subjective: No clinical seizures overnight.  However patient did pull his electrodes.  This morning states he is feeling good.  ROS: negative except above  Examination  Vital signs in last 24 hours: Temp:  [97.6 F (36.4 C)-98.2 F (36.8 C)] 97.8 F (36.6 C) (10/04 1154) Pulse Rate:  [68-74] 68 (10/04 1154) Resp:  [16-18] 18 (10/04 1154) BP: (116-195)/(85-119) 116/87 (10/04 1154) SpO2:  [100 %] 100 % (10/04 1154)  General: lying in bed, not in apparent distress CVS: pulse-normal rate and rhythm RS: breathing comfortably, CTAB Extremities: Warm, in mittens Neuro: Awake, alert, had just finished lunch, oriented only to self not to place or person, able to tell me ambulating up 2 fingers but then perseverated on his name and was unable to follow any commands, unable to name objects, spontaneously moving all 4 extremities (unable to follow complex commands to test motor strength)  Basic Metabolic Panel: Recent Labs  Lab 02/24/21 1559 02/25/21 0525 02/26/21 0234 02/26/21 1923 02/27/21 0244 02/28/21 0233  NA 138 137 137  --  133* 136  K 4.0 4.0 3.7  --  3.8 4.1  CL 105 105 104  --  104 104  CO2 24 21* 22  --  22 22  GLUCOSE 103* 82 111*  --  112* 98  BUN 15 18 16   --  11 9  CREATININE 1.20 1.18 1.24  --  1.07 0.93  CALCIUM 9.1 9.1 8.7*  --  8.4* 8.7*  MG  --  1.9  --  2.3  --   --     CBC: Recent Labs  Lab 02/24/21 1559 02/25/21 0525 02/26/21 0234 02/27/21 0244  WBC 7.5 11.8* 9.4 7.9  NEUTROABS 3.8 8.6*  --   --   HGB 15.2 15.3 15.3 14.4  HCT 45.9 45.4 45.1 42.7  MCV 77.9* 76.9* 76.2* 76.1*  PLT 276 274 251 233     Coagulation Studies: No results for input(s): LABPROT, INR in the last 72 hours.  Imaging No new brain imaging overnight  ASSESSMENT AND PLAN:  60 year old male with history of epilepsy who presented with status epilepticus.    Focal nonconvulsive status epilepticus, refractory ( resolved) Aphasia, due to status epilepticus -Patient presented  with convulsive focal status epilepticus which has improved but continues to be in nonconvulsive status epilepticus.  The main symptom of his status epilepticus is expressive aphasia.  It finally improved on 02/27/2021 afternoon -Etiology of breakthrough seizures: No evidence of infection, drug levels were not tested.  Patient did attend his father's funeral prior to the breakthrough seizures and it is possible that stress was the etiology   Recommendations -Continue Keppra 1500 mg twice daily, Vimpat 200 mg twice daily, Dilantin 100 mg every 8 hours (total phenytoin level 16 on 02/25/2021) and perampanel 4 mg nightly. -Can start phenobarbital 60 mg nightly maintenance if seizures recur. -Continue LTM EEG, will likely discontinue tomorrow if patient remains seizure-free -Continue seizure precautions -as needed IV Versed 2 mg as needed for clinical seizure lasting more than 5 minutes.  Avoiding Ativan due to national shortage -Management of rest of comorbidities per primary team  I have spent a total of 35  minutes with the patient reviewing hospital notes,  test results, labs and examining the patient as well as establishing an assessment and plan that was discussed personally with the patient and Dr 04/27/2021.  > 50% of time was spent in direct patient care.    Allena Katz Epilepsy [facility] For questions after  5pm please refer to AMION to reach the Neurologist on call

## 2021-02-28 NOTE — Plan of Care (Signed)
Stopped responding mid conversation, looking up the ceiling, saying "um," lasted about 1 min Happened at 5:20 PM, back to normal self per nursing  Starting phenobarbital 60 mg nightly per Dr. Candi Leash recommendations today.   Brooke Dare MD-PhD [facility] [phone] Available 7 AM to 7 PM, outside these hours please contact Neurologist on call listed on AMION

## 2021-02-28 NOTE — Progress Notes (Signed)
Patient was noted to have a petit mal seizure episode at about 1720,  that lasted for almost a minute. MD and neuro team notified. Patient is back to baseline at this time. He's now responding to name. No distress or discomfort noted at this time. Will continue to support and treat per MD orders.

## 2021-02-28 NOTE — Progress Notes (Signed)
   Subjective: No acute overnight events. Patient was seen at bedside during rounds today. Pt reports feeling well this AM, though he is drowsy. He is mildly interactive but is able to follows commands. He is oriented to person only. He is unable to state that he is in the hospital.   No other complains or concerns at this time.   Objective:  Vital signs in last 24 hours: Vitals:   02/27/21 2349 02/28/21 0332 02/28/21 0854 02/28/21 1154  BP: (!) 167/98 126/85 (!) 195/119 116/87  Pulse: 72 69  68  Resp: 18 16  18   Temp: 97.6 F (36.4 C) 98.2 F (36.8 C)  97.8 F (36.6 C)  TempSrc: Oral Oral  Oral  SpO2: 100% 100%  100%  Weight:      Height:       Constitutional: drowsy, well-appearing, in no acute distress. He is sitting in bed sleeping.  HENT: normocephalic, atraumatic, mucous membranes moist Eyes: conjunctiva non-erythematous, extraocular movements intact Cardiovascular: regular rate and rhythm, no m/r/g Pulmonary/Chest: normal work of breathing on room air, lungs clear to auscultation bilaterally Abdominal: soft, non-tender to palpation, non-distended MSK: normal bulk and tone Neurological: alert & oriented x 1 and is able to follow commands  Skin: warm and dry Psych: normal behavior, normal affect   Assessment/Plan:  Principal Problem:   Seizures (HCC) Active Problems:   Essential hypertension   Acute encephalopathy   Type 2 diabetes mellitus (HCC)  Arthur [name] is a 60 y.o. male with PMH significant for seizures, essential HTN, T2DM admitted for AMS secondary to seizures.    Non convulsive status Ellipticus, resolved Seizures Acute Metabolic Encephalopathy  Hx of Seizures Admission EEG consistent with focal non-convulsive status epilepticus arising from left (resolved), and moderate diffuse encephalopathy nonspecific etiology but likely related to seizures. LTN EEG showing seizure free, but moderate diffuse encephalopathy nonspecific etiology but likely related  to seizure. Neurology added phenobarbital 60 mg nightly. Pt is lethargic and mental status remains stable as he is able to minimally converse and follow commands. He remains oriented to self only. Neurology following. Will continue to monitor, and if patient becomes lethargic, will consider intubation. - Keppra 1500 po bid - Phenytoin 100 mg tid - Vimpat 200 mg bid - perampanel 4 mg nightly - Phenobarbital 60 mg nightly  - Frequent neuro checks - Seizure precautions - Versed 2 mg Q4H PRN for seizures - Appreciate Neurology's recommendations   HTN Resumed home dose BP medications. BP remains stable with sbp 150-170s. -Amlodipine 10 mg daily  -Clonidine 0.1 mg bid   T2DM:  On metformin outpatient. CBGs continue to be controlled on sliding scale insulin. A1c 6.0, 6.8 4 mo ago.   -Trend CBGs   Leukocytosis:  Peaked at 11.8 likely in setting of seizure, now resolved.  -Trend CBC  Best Practice: Diet: Carb-Modified IVF: none  VTE: SCDs Code: Full  Signature: 41, MD  Internal Medicine Resident, PGY-1 [name]  Pager: [phone] After 5pm on weekdays and 1pm on weekends: On Call pager [phone]

## 2021-02-28 NOTE — Telephone Encounter (Signed)
 Patient's medical records (problem list, medications, etc), recent laboratory tests, pertinent hospital/procedure documents reviewed. Proceed with procedure as scheduled.     Miralax prep

## 2021-02-28 NOTE — Telephone Encounter (Signed)
 Mailed Miralax Prep letter.

## 2021-02-28 NOTE — Plan of Care (Signed)

## 2021-03-01 ENCOUNTER — Telehealth (INDEPENDENT_AMBULATORY_CARE_PROVIDER_SITE_OTHER): Admitting: Family Medicine

## 2021-03-01 ENCOUNTER — Encounter (HOSPITAL_BASED_OUTPATIENT_CLINIC_OR_DEPARTMENT_OTHER)

## 2021-03-01 DIAGNOSIS — R569 Unspecified convulsions: Secondary | ICD-10-CM | POA: Diagnosis not present

## 2021-03-01 LAB — GLUCOSE, CAPILLARY
Glucose-Capillary: 86 mg/dL (ref 70–99)
Glucose-Capillary: 138 mg/dL — ABNORMAL HIGH (ref 70–99)
Glucose-Capillary: 94 mg/dL (ref 70–99)
Glucose-Capillary: 138 mg/dL — ABNORMAL HIGH (ref 70–99)

## 2021-03-01 LAB — BASIC METABOLIC PANEL
Anion gap: 8 (ref 5–15)
BUN: 14 mg/dL (ref 6–20)
CO2: 23 mmol/L (ref 22–32)
Calcium: 8.7 mg/dL — ABNORMAL LOW (ref 8.9–10.3)
Chloride: 103 mmol/L (ref 98–111)
Creatinine, Ser: 1.08 mg/dL (ref 0.61–1.24)
GFR, Estimated: >60 mL/min (ref >60)
Glucose, Bld: 88 mg/dL (ref 70–99)
Potassium: 4.5 mmol/L (ref 3.5–5.1)
Sodium: 134 mmol/L — ABNORMAL LOW (ref 135–145)

## 2021-03-01 NOTE — Progress Notes (Signed)
Subjective: Had two episodes of word finding difficulties without eeg change.   ROS: Unable to obtain due to poor mental status  Examination  Vital signs in last 24 hours: Temp:  [97.3 F (36.3 C)-97.9 F (36.6 C)] 97.5 F (36.4 C) (10/05 1156) Pulse Rate:  [79-107] 79 (10/05 1156) Resp:  [18-20] 18 (10/05 1156) BP: (152-184)/(97-118) 152/103 (10/05 1156) SpO2:  [99 %-100 %] 100 % (10/05 1156) Weight:  [85.1 kg] 85.1 kg (10/05 0500)  General: lying in bed, not in apparent distress CVS: pulse-normal rate and rhythm RS: breathing comfortably, CTAB Extremities: Warm, in mittens Neuro: Awake, alert, eating lunch, mumbling, not following any commands, unable to name objects, spontaneously moving all 4 extremities   Basic Metabolic Panel: Recent Labs  Lab 02/25/21 0525 02/26/21 0234 02/26/21 1923 02/27/21 0244 02/28/21 0233 03/01/21 0229  NA 137 137  --  133* 136 134*  K 4.0 3.7  --  3.8 4.1 4.5  CL 105 104  --  104 104 103  CO2 21* 22  --  22 22 23   GLUCOSE 82 111*  --  112* 98 88  BUN 18 16  --  11 9 14   CREATININE 1.18 1.24  --  1.07 0.93 1.08  CALCIUM 9.1 8.7*  --  8.4* 8.7* 8.7*  MG 1.9  --  2.3  --   --   --     CBC: Recent Labs  Lab 02/24/21 1559 02/25/21 0525 02/26/21 0234 02/27/21 0244  WBC 7.5 11.8* 9.4 7.9  NEUTROABS 3.8 8.6*  --   --   HGB 15.2 15.3 15.3 14.4  HCT 45.9 45.4 45.1 42.7  MCV 77.9* 76.9* 76.2* 76.1*  PLT 276 274 251 233     Coagulation Studies: No results for input(s): LABPROT, INR in the last 72 hours.  Imaging No new brain imaging overnight   ASSESSMENT AND PLAN:  60 year old male with history of epilepsy who presented with status epilepticus.    Focal nonconvulsive status epilepticus, refractory ( resolved) Aphasia, due to status epilepticus -Patient presented with convulsive focal status epilepticus which has improved but continues to be in nonconvulsive status epilepticus.  The main symptom of his status epilepticus was  expressive aphasia.  Status epilepticus resolved on 02/27/2021 afternoon but patient continues to have expressive aphasia  -Etiology of breakthrough seizures: No evidence of infection, drug levels were not tested.  Patient did attend his father's funeral prior to the breakthrough seizures and it is possible that stress was the etiology   Recommendations -Continue Keppra 1500 mg twice daily, Vimpat 200 mg twice daily, Dilantin 100 mg every 8 hours (total phenytoin level 16 on 02/25/2021) and perampanel 4 mg nightly. -Discontinuted phenobarb as I didn't see any EEG correlate with episodes overnight and trying to avoid polypharmacy. -Continue LTM EEG as patient has two events yesterday. If no events overnight, can dc tomorrow - if definite seizures overnight, can add phenobabr 65mg  qhs - Will obtain MRI brain after LTM is discontinued  -Continue seizure precautions -as needed IV Versed 2 mg as needed for clinical seizure lasting more than 5 minutes.  Avoiding Ativan due to national shortage -Management of rest of comorbidities per primary team   I have spent a total of 28  minutes with the patient reviewing hospital notes,  test results, labs and examining the patient as well as establishing an assessment and plan that was discussed personally.  > 50% of time was spent in direct patient care.   04/29/2021  Ep[facility] For questions after 5pm please refer to AMION to reach the Neurologist on call

## 2021-03-01 NOTE — Procedures (Addendum)
Patient Name: Arthur [name]  MRN: [id number]  Epilepsy Attending: Charlsie Quest  Referring Physician/Provider: Dr Ritta Slot Duration: 02/27/2021 2137 to 02/28/2021 2137   Patient history: 60yo M with h/o epilepsy, presented with anxiety and had an episode of focal right-sided twitching, right gaze deviation and not following commands. EEG to evaluate for seizure   Level of alertness:  lethargic    AEDs during EEG study: LEV, LCM, PHT, perampanel, clonazepam , phenobarb   Technical aspects: This EEG study was done with scalp electrodes positioned according to the 10-20 International system of electrode placement. Electrical activity was acquired at a sampling rate of 500Hz  and reviewed with a high frequency filter of 70Hz  and a low frequency filter of 1Hz . EEG data were recorded continuously and digitally stored.    Description: During awake state, no clear posterior dominant rhythm was seen.  Sleep was characterized by sleep spindles (12 to 14 Hz), maximal frontocentral region.  EEG showed quasi-pled like sharp waves in left posterior quadrant with fluctuating frequency between 1-1.5Hz . There is also 3-5hz  continuous generalized 3-5hz  theta-delta slowing admixed with 15-18Hz  generalized beta activity. Hyperventilation and photic stimulation were not performed.     Of note, parts of study were difficult to interpret on 02/28/2021 between midnight to 0845 AM as patient pulled his electrodes.    ABNORMALITY - Lateralized periodic discharges, left posterior quadrant - Continuous slow, generalized    IMPRESSION: This study showed evidence of epileptogenicity arising from left posterior quadrant likely due to underlying structural abnormality. Additionally, there was also moderate diffuse encephalopathy, nonspecific etiology but likely related to seizure.  No definite seizures were seen during the study.   Holmes Hays 

## 2021-03-01 NOTE — Progress Notes (Signed)
   Subjective: No acute overnight events. Patient was seen at bedside this afternoon after receiving PO and IV ativan for MRI brain, and was drowsy, not interactive, and not following commands.   No other complains or concerns at this time.   Objective:  Vital signs in last 24 hours: Vitals:   03/01/21 0500 03/01/21 0803 03/01/21 1156 03/01/21 1554  BP:  (!) 184/118 (!) 152/103 (!) 167/107  Pulse:  87 79 82  Resp:  18 18 18   Temp:  (!) 97.3 F (36.3 C) (!) 97.5 F (36.4 C) 98.1 F (36.7 C)  TempSrc:  Oral Oral Axillary  SpO2:  100% 100% 100%  Weight: 85.1 kg     Height:       Constitutional: drowsy, well-appearing, in no acute distress. He is in bed sleeping.  HENT: normocephalic, atraumatic, mucous membranes moist Eyes: conjunctiva non-erythematous, extraocular movements intact Cardiovascular: regular rate and rhythm, no m/r/g Pulmonary/Chest: normal work of breathing on room air, lungs clear to auscultation bilaterally Abdominal: soft, non-tender to palpation, non-distended MSK: normal bulk and tone Neurological: alert & oriented x 1 and is able to follow commands  Skin: warm and dry Psych: normal behavior, normal affect   Assessment/Plan:  Principal Problem:   Seizures (HCC) Active Problems:   Essential hypertension   Acute encephalopathy   Type 2 diabetes mellitus (HCC)  Arthur [name] is a 60 y.o. male with PMH significant for seizures, essential HTN, T2DM admitted for AMS secondary to seizures.    Non convulsive status Ellipticus, resolved Seizures Acute Metabolic Encephalopathy  Hx of Seizures Admission EEG consistent with focal non-convulsive status epilepticus arising from left (resolved), and moderate diffuse encephalopathy nonspecific etiology but likely related to seizures. Phenobarb discontinued by neuro yesterday to avoid polypharmacy and LTM EEG without episodes. LTM EEG discontinued this AM as EEG was event-free overnight. MRI brain completed to look  for any acute abnormalities.  Pt is drowsy and mental status remains stable as he is able to minimally converse and follow commands. He remains oriented to self only. Neurology following. Will continue to monitor, and if patient becomes lethargic, will consider intubation. - Keppra 1500 po bid - Phenytoin 100 mg tid - Vimpat 200 mg bid - perampanel 4 mg nightly - Frequent neuro checks - Seizure precautions - Versed 2 mg Q4H PRN for seizures - f/u on MRI brain  - Appreciate Neurology's recommendations   HTN Resumed home dose BP medications. BP remains stable with sbp 160s. -Amlodipine 10 mg daily  -Clonidine 0.1 mg bid   T2DM:  On metformin outpatient. CBGs continue to be controlled on sliding scale insulin. A1c 6.0, 6.8 4 mo ago.   -Trend CBGs   Leukocytosis:  Peaked at 11.8 likely in setting of seizure, now resolved.   Best Practice: Diet: Carb-Modified IVF: none  VTE: SCDs Code: Full  Signature: 41, MD  Internal Medicine Resident, PGY-1 [name]  Pager: [phone] After 5pm on weekdays and 1pm on weekends: On Call pager [phone]

## 2021-03-01 NOTE — Progress Notes (Signed)
EEG maintenance performed.  No skin breakdown observed at electrode sites Fp1, Fp2. 

## 2021-03-01 NOTE — Telephone Encounter (Signed)
The Central Referral Team has prepared this In-Network Referral Order for you.  Your patient provided the information used to complete the details.  In order to attach a Referral Order to you we needed to open a Phone Note Encounter which also needs to be signed.  Please:  1. Review the Order  2. Sign the Order  3. Sign off the Phone Note Encounter    Thanks- Central Referral Team

## 2021-03-01 NOTE — Procedures (Addendum)
Patient Name: Arthur [name]  MRN: [id number]  Epilepsy Attending: Charlsie Quest  Referring Physician/Provider: Dr Ritta Slot Duration: 02/28/2021 2137 to 03/01/2021 2137   Patient history: 60yo M with h/o epilepsy, presented with anxiety and had an episode of focal right-sided twitching, right gaze deviation and not following commands. EEG to evaluate for seizure   Level of alertness:  lethargic    AEDs during EEG study: LEV, LCM, PHT, perampanel, clonazepam , phenobarb   Technical aspects: This EEG study was done with scalp electrodes positioned according to the 10-20 International system of electrode placement. Electrical activity was acquired at a sampling rate of 500Hz  and reviewed with a high frequency filter of 70Hz  and a low frequency filter of 1Hz . EEG data were recorded continuously and digitally stored.    Description: During awake state, no clear posterior dominant rhythm was seen.  Sleep was characterized by sleep spindles (12 to 14 Hz), maximal frontocentral region.  EEG showed abundant sharp waves in left posterior quadrant. There is also 3-5hz  continuous generalized 3-5hz  theta-delta slowing admixed with 15-18Hz  generalized beta activity. Hyperventilation and photic stimulation were not performed.     Patient event button was pressed on 03/01/2021 at 0323 during which patient was unable to answer orientation questions initially but did answer in a few seconds.  Concomitant EEG before, during and after the event did not show any EEG to suggest seizure.  ABNORMALITY - Sharp waves, left posterior quadrant - Continuous slow, generalized    IMPRESSION: This study showed evidence of epileptogenicity arising from left posterior quadrant. Additionally, there was also moderate diffuse encephalopathy, nonspecific etiology but likely related to seizure.  No definite seizures were seen during the study.  Patient event button was pressed all 03/01/2021 at 0 323 during which patient  was unable to answer orientation questions for a few seconds without concomitant EEG change.  This was most likely not an epileptic event.   Marilynne Dupuis 

## 2021-03-01 NOTE — Plan of Care (Signed)

## 2021-03-01 NOTE — Plan of Care (Signed)
Event around 0317-0323. See flowsheet. Problem: Education: Goal: Knowledge of General Education information will improve Description: Including pain rating scale, medication(s)/side effects and non-pharmacologic comfort measures Outcome: Progressing   Problem: Health Behavior/Discharge Planning: Goal: Ability to manage health-related needs will improve Outcome: Progressing   Problem: Clinical Measurements: Goal: Ability to maintain clinical measurements within normal limits will improve Outcome: Progressing Goal: Will remain free from infection Outcome: Progressing Goal: Diagnostic test results will improve Outcome: Progressing Goal: Respiratory complications will improve Outcome: Progressing Goal: Cardiovascular complication will be avoided Outcome: Progressing   Problem: Activity: Goal: Risk for activity intolerance will decrease Outcome: Progressing   Problem: Nutrition: Goal: Adequate nutrition will be maintained Outcome: Progressing   Problem: Coping: Goal: Level of anxiety will decrease Outcome: Progressing   Problem: Elimination: Goal: Will not experience complications related to bowel motility Outcome: Progressing Goal: Will not experience complications related to urinary retention Outcome: Progressing   Problem: Pain Managment: Goal: General experience of comfort will improve Outcome: Progressing   Problem: Safety: Goal: Ability to remain free from injury will improve Outcome: Progressing   Problem: Skin Integrity: Goal: Risk for impaired skin integrity will decrease Outcome: Progressing   Problem: Safety: Goal: Non-violent Restraint(s) Outcome: Progressing

## 2021-03-02 ENCOUNTER — Inpatient Hospital Stay (HOSPITAL_COMMUNITY): Payer: Medicaid Other

## 2021-03-02 ENCOUNTER — Telehealth: Payer: Self-pay

## 2021-03-02 DIAGNOSIS — Z0271 Encounter for disability determination: Secondary | ICD-10-CM

## 2021-03-02 DIAGNOSIS — R569 Unspecified convulsions: Secondary | ICD-10-CM | POA: Diagnosis not present

## 2021-03-02 LAB — GLUCOSE, CAPILLARY
Glucose-Capillary: 103 mg/dL — ABNORMAL HIGH (ref 70–99)
Glucose-Capillary: 103 mg/dL — ABNORMAL HIGH (ref 70–99)
Glucose-Capillary: 110 mg/dL — ABNORMAL HIGH (ref 70–99)
Glucose-Capillary: 95 mg/dL (ref 70–99)

## 2021-03-02 LAB — BRAIN NATRIURETIC PEPTIDE: B Natriuretic Peptide: 77.6 pg/mL (ref 0.0–100.0)

## 2021-03-02 MED ORDER — LORAZEPAM 2 MG/ML IJ SOLN
2.0000 mg | Freq: Once | INTRAMUSCULAR | Status: AC
  Administered 2021-03-03: 2 mg via INTRAVENOUS
  Filled 2021-03-02: qty 1

## 2021-03-02 MED ORDER — LORAZEPAM 1 MG PO TABS
2.0000 mg | ORAL_TABLET | Freq: Once | ORAL | Status: AC
  Administered 2021-03-02: 2 mg via ORAL
  Filled 2021-03-02: qty 2

## 2021-03-02 NOTE — Progress Notes (Signed)
Subjective: No acute events overnight.  No new concerns per RN.  ROS: Unable to obtain due to poor mental status  Examination  Vital signs in last 24 hours: Temp:  [97.5 F (36.4 C)-98.4 F (36.9 C)] 97.5 F (36.4 C) (10/06 1132) Pulse Rate:  [70-84] 81 (10/06 1132) Resp:  [17-18] 18 (10/06 1132) BP: (112-167)/(71-107) 112/71 (10/06 1132) SpO2:  [98 %-100 %] 99 % (10/06 1132)  General: lying in bed, not in apparent distress CVS: pulse-normal rate and rhythm RS: breathing comfortably, CTAB Extremities: Warm, in mittens Neuro: Only moaning to noxious stimuli, not following commands, spontaneously moving all 4 extremities.  Of note, patient had received Ativan prior to my exam for MRI.   Basic Metabolic Panel: Recent Labs  Lab 02/25/21 0525 02/26/21 0234 02/26/21 1923 02/27/21 0244 02/28/21 0233 03/01/21 0229  NA 137 137  --  133* 136 134*  K 4.0 3.7  --  3.8 4.1 4.5  CL 105 104  --  104 104 103  CO2 21* 22  --  22 22 23   GLUCOSE 82 111*  --  112* 98 88  BUN 18 16  --  11 9 14   CREATININE 1.18 1.24  --  1.07 0.93 1.08  CALCIUM 9.1 8.7*  --  8.4* 8.7* 8.7*  MG 1.9  --  2.3  --   --   --     CBC: Recent Labs  Lab 02/24/21 1559 02/25/21 0525 02/26/21 0234 02/27/21 0244  WBC 7.5 11.8* 9.4 7.9  NEUTROABS 3.8 8.6*  --   --   HGB 15.2 15.3 15.3 14.4  HCT 45.9 45.4 45.1 42.7  MCV 77.9* 76.9* 76.2* 76.1*  PLT 276 274 251 233     Coagulation Studies: No results for input(s): LABPROT, INR in the last 72 hours.  Imaging No new brain imaging overnight   ASSESSMENT AND PLAN:  60 year old male with history of epilepsy who presented with status epilepticus.    Focal nonconvulsive status epilepticus, refractory ( resolved) Aphasia, due to status epilepticus -Patient presented with convulsive focal status epilepticus which has improved but continues to be in nonconvulsive status epilepticus.  The main symptom of his status epilepticus was expressive aphasia.  Status  epilepticus resolved on 02/27/2021 afternoon but patient continues to have expressive aphasia  -Etiology of breakthrough seizures: No evidence of infection, drug levels were not tested.  Patient did attend his father's funeral prior to the breakthrough seizures and it is possible that stress was the etiology   Recommendations -Continue Keppra 1500 mg twice daily, Vimpat 200 mg twice daily, Dilantin 100 mg every 8 hours (total phenytoin level 16 on 02/25/2021) and perampanel 4 mg nightly. Discontinue LTM EEG as patient has remained seizure-free -We will obtain MRI brain without contrast to look for any acute abnormality.  Patient did not stay still after p.o. Ativan.  Therefore, I ordered IV Ativan for sedation. -Continue seizure precautions -as needed IV Versed 2 mg as needed for clinical seizure lasting more than 5 minutes.  Avoiding Ativan due to national shortage -Management of rest of comorbidities per primary team  I have spent a total of  35 minutes with the patient reviewing hospital notes,  test results, labs and examining the patient as well as establishing an assessment and plan that was discussed personally with the patient's RN and medicine resident.  > 50% of time was spent in direct patient care.   04/29/2021 [facility] For questions after 5pm please refer to  AMION to reach the Neurologist on call

## 2021-03-02 NOTE — Progress Notes (Signed)
LTM EEG discontinued now.  No skin breakdown seen. Results pending.

## 2021-03-02 NOTE — Progress Notes (Addendum)
   Subjective: No acute overnight events. Patient was seen at bedside during rounds today. He is seen after receiving IV ativan for an MRI and is drowsy. He is not interactive and is unable to follows commands. He is oriented to person only.    No other complains or concerns at this time.   Objective:  Vital signs in last 24 hours: Vitals:   03/01/21 2334 03/02/21 0322 03/02/21 0755 03/02/21 1132  BP: 115/87 (!) 147/91 (!) 152/94 112/71  Pulse: 78 70 77 81  Resp: 17 18 18 18   Temp: 97.8 F (36.6 C) 98.4 F (36.9 C) 98.3 F (36.8 C) (!) 97.5 F (36.4 C)  TempSrc: Oral Oral Oral Oral  SpO2: 98% 99% 100% 99%  Weight:      Height:       Constitutional: drowsy, well-appearing, in no acute distress.  HENT: normocephalic, atraumatic, mucous membranes moist Eyes: conjunctiva non-erythematous, extraocular movements intact Cardiovascular: regular rate and rhythm, no m/r/g Pulmonary/Chest: normal work of breathing on room air, lungs clear to auscultation bilaterally Abdominal: soft, non-tender to palpation, non-distended MSK: normal bulk and tone Neurological: alert & oriented x 1 and is able to follow commands  Skin: warm and dry   Assessment/Plan:  Principal Problem:   Seizures (HCC) Active Problems:   Essential hypertension   Acute encephalopathy   Type 2 diabetes mellitus (HCC)  Arthur [name] is a 60 y.o. male with PMH significant for seizures, essential HTN, T2DM admitted for AMS secondary to seizures.   Non convulsive status Ellipticus, resolved Seizures Acute Metabolic Encephalopathy  Hx of Seizures Admission EEG consistent with focal non-convulsive status epilepticus arising from left (resolved), and moderate diffuse encephalopathy nonspecific etiology but likely related to seizures. LTM EEG discontinued 10/6 as pt remained seizure free. MRI brain to look for acute abnormalities negative. Neurology following, with plans to taper phenytoin next week if patient remains  seizure free.  Pt is drowsy and mental status remains stable over several days. Ongoing lethargy and confusion can be for a combination of sedative agents and status post status epilepticus. He remains oriented to self only. Will continue to monitor, and if patient becomes lethargic, will consider intubation. - Keppra 1500 po bid - Phenytoin 100 mg tid - Vimpat 200 mg bid - perampanel 4 mg nightly - Frequent neuro checks - Seizure precautions - Versed 2 mg Q4H PRN for seizures - Appreciate Neurology's recommendations   HTN Resumed home dose BP medications. BP remains stable with sbp 120-150s. Will continue current regimen.  -Amlodipine 10 mg daily  -Clonidine 0.1 mg bid   T2DM:  On metformin outpatient. CBGs continue to be controlled on sliding scale insulin. A1c 6.0, 6.8 4 mo ago.   -Trend CBGs   Leukocytosis:  Peaked at 11.8 likely in setting of seizure, now resolved.   Best Practice: Diet: Carb-Modified IVF: none  VTE: SCDs Code: Full  Signature: 41, MD  Internal Medicine Resident, PGY-1 [name]  Pager: [phone] After 5pm on weekdays and 1pm on weekends: On Call pager [phone]

## 2021-03-02 NOTE — Procedures (Signed)
Patient Name: Arthur [name]  MRN: [id number]  Epilepsy Attending: Charlsie Quest  Referring Physician/Provider: Dr Ritta Slot Duration: 03/01/2021 2137 to 03/02/2021 0914   Patient history: 61yo M with h/o epilepsy, presented with anxiety and had an episode of focal right-sided twitching, right gaze deviation and not following commands. EEG to evaluate for seizure   Level of alertness:  awake, asleep   AEDs during EEG study: LEV, LCM, PHT, perampanel, clonazepam    Technical aspects: This EEG study was done with scalp electrodes positioned according to the 10-20 International system of electrode placement. Electrical activity was acquired at a sampling rate of 500Hz  and reviewed with a high frequency filter of 70Hz  and a low frequency filter of 1Hz . EEG data were recorded continuously and digitally stored.    Description: The posterior dominant rhythm consists of 8 Hz activity of moderate voltage (25-35 uV) seen predominantly in posterior head regions, symmetric and reactive to eye opening and eye closing. Sleep was characterized by sleep spindles (12 to 14 Hz), maximal frontocentral region. EEG showed abundant sharp waves in left posterior quadrant. There is also intermittent 3-5hz  theta-delta slowing in left posterior quadrant admixed with 15-18Hz  generalized beta activity. Hyperventilation and photic stimulation were not performed.      ABNORMALITY - Sharp waves, left posterior quadrant - Intermittent slow, left posterior quadrant - Excessive beta, generalized   IMPRESSION: This study showed evidence of epileptogenicity and cortical dysfunction arising from left posterior quadrant. The excessive beta activity seen in the background is most likely due to the effect of benzodiazepine and is a benign EEG pattern. No definite seizures were seen during the study.   Katha Kuehne 

## 2021-03-02 NOTE — Discharge Instructions (Addendum)
ArthurPatrick you were admitted to the hospital because of altered mental status in the setting of seizures, after the loss of a family member. This could have been a possible trigger. A picture of the brain when you first came to the hospital did not show that you had any sort of physical brain damage. An EEG was done showing that you were having seizures, and that is why you were started on several medications for seizures. The neurology team closely followed you, did a continuous EEG until you stayed free of seizures, and continued to adjust your seizure medications as needed. A brain MRI done after did not show any new changes that could explain the expressive aphasia. Your medications were then tapered as required by the neurologist.   You are being discharged with medications PLEASE TAKE these as prescribed:  CONTINUE Keppra 1500 mg twice daily and Vimpat 200 mg twice daily. FINISH phenytoin and parampanel taper as instructed:  Perampanel 4 mg for 5 days, followed by perampanel 2 mg for 10 days. Phenytoin 50 mg three times a day for 5 days, THEN Phenytoin 50 mg  two times a day for 5 days, THEN Phenytoin 50 mg one time a day for 5 days UNTIL complete.   PLEASE continue taking all other medications as prescribed.    Please schedule an appointment with your primary care doctor to discuss these changes, and to determine you care plan. ALSO, schedule an outpatient appointment with neurology (information in discharge packet) to further discuss your management of this moving forward. PLEASE schedule these appointments.   By [place] law, YOU CANNOT DRIVE until you are free of seizures for 6 months, for your safety as well as the safety of others.   Please return to the ED if you have similar symptoms that come back.

## 2021-03-03 ENCOUNTER — Inpatient Hospital Stay (HOSPITAL_COMMUNITY): Payer: Medicaid Other

## 2021-03-03 DIAGNOSIS — R569 Unspecified convulsions: Secondary | ICD-10-CM | POA: Diagnosis not present

## 2021-03-03 LAB — GLUCOSE, CAPILLARY
Glucose-Capillary: 93 mg/dL (ref 70–99)
Glucose-Capillary: 124 mg/dL — ABNORMAL HIGH (ref 70–99)
Glucose-Capillary: 91 mg/dL (ref 70–99)

## 2021-03-03 NOTE — Evaluation (Signed)
Physical Therapy Evaluation Patient Details Name: Arthur [name] MRN: [id number] DOB: Mar 15, 1961 Today's Date: 03/03/2021  History of Present Illness  60 y.o. male admitted 02/24/2021 with seizure after presenting from home to [facility] ED complaining of anxiety had an episode of focal right-sided twitching, right gaze deviation and not following commands. Pt with multiple seizures and onset of AMS during admittance,, EEG show evidence of epileptogenicity and cortical dysfunction arising from left posterior quadrant. MRI 03/03/21 shows Increased T2 signal, decreased volume and blurring of internal structures of the left hippocampus suggesting mesial temporal sclerosis. PMH:  epileptic seizures, essential hypertension, type 2 diabetes mellitus.  Clinical Impression  Very limited Evaluation due to pt lethargy. Per prior admission 5/22 pt living with wife in single story home with 2 steps, and per prior notes on the admission he just travelled to [place] for his father's funeral so likely independent in mobility. Pt is currently limited in safe mobility by increased lethargy from Ativan given for MRI. Pt found with macaroni and cheese and broccoli all down his front and around his mouth so obviously had eaten some lunch. With increased multimodal stimulation pt able arouse and respond to his name. Did not verbalize at all during session but nod head no to inquiry into pain. Follows very sparse commands, able to move all extremities spontaneously and squeeze bilateral hands on command. PROM WFL. Pt requires total A for bed mobility. Once EoB able to statically sit without assist before falling back to sleep. PT recommending SNF level rehab based on current presentation however PT will continue to follow acutely and reassess during next session.        Recommendations for follow up therapy are one component of a multi-disciplinary discharge planning process, led by the attending physician.  Recommendations may be updated based  on patient status, additional functional criteria and insurance authorization.  Follow Up Recommendations Supervision/Assistance - 24 hour;SNF    Equipment Recommendations  None recommended by PT (has RW from 5/22 admission)    Recommendations for Other Services OT consult     Precautions / Restrictions Precautions Precautions: Fall Restrictions Weight Bearing Restrictions: No      Mobility  Bed Mobility Overal bed mobility: Needs Assistance Bed Mobility: Supine to Sit;Sit to Supine     Supine to sit: Total assist;HOB elevated Sit to supine: Total assist;HOB elevated   General bed mobility comments: with increased cuing and initation of movement pt with slight movement towards EoB, however requires total A for coming to EoB, pt nods head yes to return to bed requires total A to return to supine           Balance Overall balance assessment: Needs assistance Sitting-balance support: Feet unsupported;Bilateral upper extremity supported Sitting balance-Leahy Scale: Poor Sitting balance - Comments: able to maintain balance with min outside support when awake and eyes open, able to maintain seated balance for approx 12 sec before return to increased posterior lean and closed eyes                                     Pertinent Vitals/Pain Pain Assessment: No/denies pain (shakes head no)    Home Living Family/patient expects to be discharged to:: Private residence Living Arrangements: Spouse/significant other Available Help at Discharge: Family Type of Home: House Home Access: Stairs to enter   Secretary/administrator of Steps: 2 Home Layout: One level Home Equipment: Environmental consultant - 2 wheels  Additional Comments: information collected from admittance 09/2020    Prior Function Level of Independence:  (unsure)               Extremity/Trunk Assessment   Upper Extremity Assessment Upper Extremity Assessment: Defer to OT evaluation    Lower Extremity  Assessment Lower Extremity Assessment: Difficult to assess due to impaired cognition (PROM in hips, knees and ankles WFL)       Communication   Communication: Expressive difficulties  Cognition Arousal/Alertness: Lethargic;Suspect due to medications (Ativan for MRI) Behavior During Therapy: Flat affect Overall Cognitive Status: Difficult to assess                                 General Comments: pt mumbles acknowledgement of his name, does not answer any other questions, follows commands very sporatically      General Comments General comments (skin integrity, edema, etc.): VSS on RA, pt with lunch all over his chest, obviously has eaten some food        Assessment/Plan    PT Assessment Patient needs continued PT services  PT Problem List Decreased balance;Decreased activity tolerance;Decreased mobility;Decreased cognition       PT Treatment Interventions DME instruction;Gait training;Stair training;Functional mobility training;Therapeutic activities;Therapeutic exercise;Balance training;Cognitive remediation;Patient/family education    PT Goals (Current goals can be found in the Care Plan section)  Acute Rehab PT Goals Patient Stated Goal: none stated PT Goal Formulation: Patient unable to participate in goal setting Time For Goal Achievement: 03/17/21 Potential to Achieve Goals: Fair    Frequency Min 3X/week    AM-PAC PT "6 Clicks" Mobility  Outcome Measure Help needed turning from your back to your side while in a flat bed without using bedrails?: Total Help needed moving from lying on your back to sitting on the side of a flat bed without using bedrails?: Total Help needed moving to and from a bed to a chair (including a wheelchair)?: Total Help needed standing up from a chair using your arms (e.g., wheelchair or bedside chair)?: Total Help needed to walk in hospital room?: Total Help needed climbing 3-5 steps with a railing? : Total 6 Click Score:  6    End of Session   Activity Tolerance: Patient limited by lethargy Patient left: in bed;with call bell/phone within reach;with bed alarm set;with SCD's reapplied;Other (comment) (wrist restraints present but not applied on entry so did not apply at end of session) Nurse Communication: Mobility status PT Visit Diagnosis: Other abnormalities of gait and mobility (R26.89);Difficulty in walking, not elsewhere classified (R26.2);Other symptoms and signs involving the nervous system (Z36.644)    Time: 0347-4259 PT Time Calculation (min) (ACUTE ONLY): 22 min   Charges:   PT Evaluation $PT Eval Moderate Complexity: 1 Mod          Meggie Laseter B. Beverely Risen PT, DPT Acute Rehabilitation Services Pager [phone] Office [phone]   Elon Alas Fleet 03/03/2021, 1:45 PM

## 2021-03-03 NOTE — Plan of Care (Signed)
MRI completed today.  Problem: Education: Goal: Knowledge of General Education information will improve Description: Including pain rating scale, medication(s)/side effects and non-pharmacologic comfort measures Outcome: Progressing   Problem: Health Behavior/Discharge Planning: Goal: Ability to manage health-related needs will improve Outcome: Progressing   Problem: Clinical Measurements: Goal: Ability to maintain clinical measurements within normal limits will improve Outcome: Progressing Goal: Will remain free from infection Outcome: Progressing Goal: Diagnostic test results will improve Outcome: Progressing Goal: Respiratory complications will improve Outcome: Progressing Goal: Cardiovascular complication will be avoided Outcome: Progressing   Problem: Activity: Goal: Risk for activity intolerance will decrease Outcome: Progressing   Problem: Nutrition: Goal: Adequate nutrition will be maintained Outcome: Progressing   Problem: Coping: Goal: Level of anxiety will decrease Outcome: Progressing   Problem: Elimination: Goal: Will not experience complications related to bowel motility Outcome: Progressing Goal: Will not experience complications related to urinary retention Outcome: Progressing   Problem: Pain Managment: Goal: General experience of comfort will improve Outcome: Progressing   Problem: Safety: Goal: Ability to remain free from injury will improve Outcome: Progressing   Problem: Skin Integrity: Goal: Risk for impaired skin integrity will decrease Outcome: Progressing   Problem: Safety: Goal: Non-violent Restraint(s) Outcome: Progressing

## 2021-03-03 NOTE — Progress Notes (Signed)
Occupational Therapy Evaluation completed full write up to follow Recommend SNF  Eber Jones., OTR/L Acute Rehabilitation Services Pager [phone] Office [phone]

## 2021-03-03 NOTE — Progress Notes (Signed)
   Subjective: No acute overnight events. Patient was seen at bedside during rounds today. He reports feeling well. He is orientated to person and place only. He states that he wants to go home; we explained his events and the current plan. He is agreeable. He is able to follow some commands, such as hand grip and feet movement.   He has no complaints or concerns at this time.    Objective:  Vital signs in last 24 hours: Vitals:   03/02/21 2334 03/03/21 0342 03/03/21 0740 03/03/21 1119  BP: 111/86 (!) 161/106 (!) 158/105 (!) 157/109  Pulse: 86 83 89 85  Resp: 18 17 18 18   Temp: 98 F (36.7 C) (!) 97.5 F (36.4 C) 98.1 F (36.7 C) 98.5 F (36.9 C)  TempSrc: Oral Oral Oral Oral  SpO2: 97% 100% 100% 98%  Weight:      Height:       Constitutional: drowsy, well-appearing, in no acute distress.  HENT: normocephalic, atraumatic, mucous membranes moist Eyes: conjunctiva non-erythematous, extraocular movements intact Cardiovascular: regular rate and rhythm, no m/r/g Pulmonary/Chest: normal work of breathing on room air, lungs clear to auscultation bilaterally Abdominal: soft, non-tender to palpation, non-distended MSK: normal bulk and tone Neurological: alert & oriented x 2 and is able to follow some commands  Skin: warm and dry   Assessment/Plan:  Principal Problem:   Seizures (HCC) Active Problems:   Essential hypertension   Acute encephalopathy   Type 2 diabetes mellitus (HCC)  MOHAMEDAMIN NIFONG is a 60 y.o. male with PMH significant for seizures, essential HTN, T2DM admitted for AMS secondary to seizures.    Non convulsive status Ellipticus, resolved Seizures Acute Metabolic Encephalopathy  Hx of Seizures Admission EEG consistent with focal non-convulsive status epilepticus arising from left (resolved), and nonspecific moderate diffuse encephalopathy likely related to seizures. LTM EEG discontinued, MRI brain without acute abnormalities. Neurology following, plan to taper  phenytoin next week if pt remains seizure free.  Pt is drowsy and mental status improved compared to previous days. Now oriented to person and place. Ongoing expressive aphasia likely combination of sedative agents and post ictal state s/p status epilepticus. - Keppra 1500 po bid - Phenytoin 100 mg tid - Vimpat 200 mg bid - perampanel 4 mg nightly - Frequent neuro checks - Seizure precautions - Versed 2 mg Q4H PRN for seizures - Appreciate Neurology's recommendations - PT/OT following, PT rec SNF and OT rec CIR    HTN Resumed home dose BP medications. BP remains stable with sbp 140-150s. Will continue current regimen.  -Amlodipine 10 mg daily  -Clonidine 0.1 mg bid   T2DM:  On metformin outpatient. CBGs continue to be controlled on SSI. A1c 6.0, 6.8 4 mo ago.   -Trend CBGs   Leukocytosis:  Peaked at 11.8 likely in setting of seizure, now resolved.   Best Practice: Diet: Carb-Modified IVF: none  VTE: SCDs Code: Full  Signature: 41, MD  Internal Medicine Resident, PGY-1 [name]  Pager: [phone] After 5pm on weekdays and 1pm on weekends: On Call pager [phone]

## 2021-03-03 NOTE — Progress Notes (Signed)
Subjective: No clinical seizures overnight.  ROS: Unable to obtain due to poor mental status  Examination  Vital signs in last 24 hours: Temp:  [97.5 F (36.4 C)-98.5 F (36.9 C)] 98.5 F (36.9 C) (10/07 1119) Pulse Rate:  [79-89] 85 (10/07 1119) Resp:  [17-18] 18 (10/07 1119) BP: (111-161)/(86-109) 157/109 (10/07 1119) SpO2:  [97 %-100 %] 98 % (10/07 1119)  General: lying in bed, not in apparent distress CVS: pulse-normal rate and rhythm RS: breathing comfortably, CTAB Extremities: Warm, in mittens Neuro: Opens eyes and able to mumble his name but not answering any other questions, not following commands, spontaneously moving all 4 extremities.  Of note, patient had received IV Ativan prior to my exam for MRI.   Basic Metabolic Panel: Recent Labs  Lab 02/25/21 0525 02/26/21 0234 02/26/21 1923 02/27/21 0244 02/28/21 0233 03/01/21 0229  NA 137 137  --  133* 136 134*  K 4.0 3.7  --  3.8 4.1 4.5  CL 105 104  --  104 104 103  CO2 21* 22  --  22 22 23   GLUCOSE 82 111*  --  112* 98 88  BUN 18 16  --  11 9 14   CREATININE 1.18 1.24  --  1.07 0.93 1.08  CALCIUM 9.1 8.7*  --  8.4* 8.7* 8.7*  MG 1.9  --  2.3  --   --   --     CBC: Recent Labs  Lab 02/24/21 1559 02/25/21 0525 02/26/21 0234 02/27/21 0244  WBC 7.5 11.8* 9.4 7.9  NEUTROABS 3.8 8.6*  --   --   HGB 15.2 15.3 15.3 14.4  HCT 45.9 45.4 45.1 42.7  MCV 77.9* 76.9* 76.2* 76.1*  PLT 276 274 251 233     Coagulation Studies: No results for input(s): LABPROT, INR in the last 72 hours.  Imaging MRI brain without contrast 03/03/2021: Increased T2 signal, decreased volume and blurring of internal structures of the left hippocampus suggesting mesial temporal sclerosis. 2. 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 3. 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  ASSESSMENT AND PLAN:  60 year old male with history of epilepsy who presented with status epilepticus.    Focal nonconvulsive status epilepticus, refractory ( resolved) Aphasia, due to status epilepticus -Patient presented with convulsive focal status epilepticus which has improved but continues to be in nonconvulsive status epilepticus.  The main symptom of his status epilepticus was expressive aphasia.  Status epilepticus resolved on 02/27/2021 afternoon but patient continues to have expressive aphasia  -Etiology of breakthrough seizures: No evidence of infection, drug levels were not tested.  Patient did attend his father's funeral prior to the breakthrough seizures and it is possible that stress was the etiology   Recommendations -Continue Keppra 1500 mg twice daily, Vimpat 200 mg twice daily, Dilantin 100 mg every 8 hours (total phenytoin level 16 on 02/25/2021) and perampanel 4 mg nightly. 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  This finding is most likely due to epilepsy.  Patients with mesial temporal sclerosis can have normal speech.  Therefore I would not attribute his current speech deficits completely to mesial temporal sclerosis.  It is possible that his current aphasia is related to postictal state, antiseizure medications.  We will continue to monitor for improvement -Continue seizure precautions -as needed IV Versed 2 mg  as needed for clinical seizure lasting more than 5 minutes.  Avoiding Ativan due to national shortage -Management of rest of comorbidities per primary team   I have spent a total of  25 minutes with the patient reviewing hospital notes,  test results, labs and examining the patient as well as establishing an assessment and plan that was discussed personally with the medicine resident.  > 50% of time was spent in direct patient care.    Lindie Spruce Epilepsy [facility] For questions after 5pm please refer to AMION to reach the Neurologist on call

## 2021-03-04 DIAGNOSIS — R569 Unspecified convulsions: Secondary | ICD-10-CM | POA: Diagnosis not present

## 2021-03-04 LAB — GLUCOSE, CAPILLARY: Glucose-Capillary: 88 mg/dL (ref 70–99)

## 2021-03-04 MED ORDER — SENNA 8.6 MG PO TABS
2.0000 | ORAL_TABLET | Freq: Every day | ORAL | Status: DC
  Administered 2021-03-04 – 2021-03-11 (×8): 17.2 mg via ORAL
  Filled 2021-03-04 (×8): qty 2

## 2021-03-04 NOTE — Progress Notes (Signed)
Physical Therapy Treatment Patient Details Name: Arthur [name] MRN: [id number] DOB: Oct 26, 1960 Today's Date: 03/04/2021   History of Present Illness 60 y.o. male admitted 02/24/2021 with seizure after presenting from home to [facility] ED complaining of anxiety had an episode of focal right-sided twitching, right gaze deviation and not following commands. Pt with multiple seizures and onset of AMS during admittance,, EEG show evidence of epileptogenicity and cortical dysfunction arising from left posterior quadrant. MRI 03/03/21 shows Increased T2 signal, decreased volume and blurring of internal structures of the left hippocampus suggesting mesial temporal sclerosis. PMH:  epileptic seizures, essential hypertension, type 2 diabetes mellitus.    PT Comments    Pt admitted with above diagnosis. Pt was able to stand pivot to chair but needed mod assist of 2 persons as pt with posterior lean and needing assist to move feet for pivot transfer. Pt is weak and cannot sit up on EOB without support as well. Will need therapy prior to d/c home unless wife can provide this support.  Pt currently with functional limitations due to balance and endurance deficits. Pt will benefit from skilled PT to increase their independence and safety with mobility to allow discharge to the venue listed below.      Recommendations for follow up therapy are one component of a multi-disciplinary discharge planning process, led by the attending physician.  Recommendations may be updated based on patient status, additional functional criteria and insurance authorization.  Follow Up Recommendations  Supervision/Assistance - 24 hour;SNF     Equipment Recommendations  None recommended by PT (has RW from 5/22 admission)    Recommendations for Other Services OT consult     Precautions / Restrictions Precautions Precautions: Fall Restrictions Weight Bearing Restrictions: No     Mobility  Bed Mobility Overal bed mobility: Needs  Assistance Bed Mobility: Rolling;Supine to Sit;Sit to Supine Rolling: Min assist   Supine to sit: Min assist     General bed mobility comments: pt requires min assist and cues.    Transfers Overall transfer level: Needs assistance   Transfers: Sit to/from Stand;Stand Pivot Transfers Sit to Stand: Max assist;+2 physical assistance Stand pivot transfers: Mod assist;+2 physical assistance       General transfer comment: Pt able to stand with max A of 2 to RW.  Pt leans posteriorly. Needed assist to take pivotal steps to chair with pt taking tiny steps around to chair.  Ambulation/Gait                 Stairs             Wheelchair Mobility    Modified Rankin (Stroke Patients Only)       Balance Overall balance assessment: Needs assistance Sitting-balance support: Feet unsupported;Bilateral upper extremity supported Sitting balance-Leahy Scale: Poor Sitting balance - Comments: pt sat EOB with mod A - periods of min guard assist.  Demonstrates Rt lateral lean and posterior lean   Standing balance support: Bilateral upper extremity supported;During functional activity Standing balance-Leahy Scale: Poor Standing balance comment: requires UE support and heavy external assist                            Cognition Arousal/Alertness: Awake/alert;Lethargic;Suspect due to medications Behavior During Therapy: [facility] for tasks assessed/performed Overall Cognitive Status: Impaired/Different from baseline Area of Impairment: Attention;Orientation;Following commands;Problem solving;Awareness                 Orientation Level: Disoriented to;Place;Time;Situation Current Attention  Level: Sustained   Following Commands: Follows one step commands inconsistently;Follows one step commands with increased time     Problem Solving: Slow processing;Decreased initiation;Difficulty sequencing;Requires verbal cues;Requires tactile cues General Comments: Pt  repeating questions that PT asks.  Perseverating on each question.  He followed one step motor commands 70% of the time with a delay and with gestural cues.      Exercises      General Comments General comments (skin integrity, edema, etc.): VSS.  Set pt up for lunch.  Noted that pt was putting his mashed potatoes in his pudding. Questioned if pt had visual issues which pt never responded to the question. Asked nursing to assist pt with eating his meal.      Pertinent Vitals/Pain Pain Assessment: No/denies pain    Home Living                      Prior Function            PT Goals (current goals can now be found in the care plan section) Acute Rehab PT Goals Patient Stated Goal: none stated Progress towards PT goals: Progressing toward goals    Frequency    Min 3X/week      PT Plan Current plan remains appropriate    Co-evaluation              AM-PAC PT "6 Clicks" Mobility   Outcome Measure  Help needed turning from your back to your side while in a flat bed without using bedrails?: Total Help needed moving from lying on your back to sitting on the side of a flat bed without using bedrails?: Total Help needed moving to and from a bed to a chair (including a wheelchair)?: Total Help needed standing up from a chair using your arms (e.g., wheelchair or bedside chair)?: Total Help needed to walk in hospital room?: Total Help needed climbing 3-5 steps with a railing? : Total 6 Click Score: 6    End of Session Equipment Utilized During Treatment: Gait belt Activity Tolerance: Patient limited by fatigue Patient left: with call bell/phone within reach;in chair;with chair alarm set Nurse Communication: Mobility status PT Visit Diagnosis: Other abnormalities of gait and mobility (R26.89);Difficulty in walking, not elsewhere classified (R26.2);Other symptoms and signs involving the nervous system (R29.898)     Time: 8502-7741 PT Time Calculation (min)  (ACUTE ONLY): 22 min  Charges:  $Therapeutic Activity: 8-22 mins                     Sasha Rogel M,PT Acute Rehab Services [phone] (pager)    Bevelyn Buckles 03/04/2021, 2:00 PM

## 2021-03-04 NOTE — Progress Notes (Signed)
Occupational Therapy Evaluation (late entry)  Pt admitted with the below listed diagnosis and demonstrates the below listed deficits.  He currently requires max - total A for ADLs and max A for bed mobility and standing.  He was able to follow one step commands ~70% of the time.  He is unable to provide details re: PLOF.  Anticipate he will require post acute rehab at discharge - recommend CIR.    03/03/21 1800  OT Visit Information  Last OT Received On 03/03/21  Assistance Needed +1  History of Present Illness 60 y.o. male admitted 02/24/2021 with seizure after presenting from home to [facility] ED complaining of anxiety had an episode of focal right-sided twitching, right gaze deviation and not following commands. Pt with multiple seizures and onset of AMS during admittance,, EEG show evidence of epileptogenicity and cortical dysfunction arising from left posterior quadrant. MRI 03/03/21 shows Increased T2 signal, decreased volume and blurring of internal structures of the left hippocampus suggesting mesial temporal sclerosis. PMH:  epileptic seizures, essential hypertension, type 2 diabetes mellitus.  Precautions  Precautions Fall  Home Living  Family/patient expects to be discharged to: Private residence  Living Arrangements Spouse/significant other  Available Help at Discharge Family  Type of Home House  Home Access Stairs to enter  Entrance Stairs-Number of Steps 2  Home Layout One level  Bathroom Shower/Tub Walk-in shower  Bathroom Toilet Handicapped height  Home Equipment [place] - 2 wheels  Additional Comments information collected from admittance 09/2020  Prior Function  Comments pt unable to provide info  Communication  Communication Expressive difficulties  Cognition  Arousal/Alertness Awake/alert;Lethargic;Suspect due to medications  Behavior During Therapy [facility] for tasks assessed/performed  Overall Cognitive Status Impaired/Different from baseline  Area of Impairment  Attention;Orientation;Following commands;Problem solving;Awareness  Orientation Level Disoriented to;Place;Time;Situation  Current Attention Level Sustained  Following Commands Follows one step commands inconsistently;Follows one step commands with increased time  Problem Solving Slow processing;Decreased initiation;Difficulty sequencing;Requires verbal cues;Requires tactile cues  General Comments Pt tells me his name is "Lamond Glantz", with a grin.  He did perseverate on the word Love.  He followed one step motor commands 70% of the time with a delay and with gestural cues.  Upper Extremity Assessment  Upper Extremity Assessment Generalized weakness  Lower Extremity Assessment  Lower Extremity Assessment Defer to PT evaluation  Cervical / Trunk Assessment  Cervical / Trunk Assessment Normal  ADL  Overall ADL's  Needs assistance/impaired  Eating/Feeding Maximal assistance;Sitting;Bed level  Grooming Wash/dry hands;Wash/dry face;Moderate assistance;Sitting  Upper Body Bathing Total assistance;Sitting  Lower Body Bathing Total assistance;Sit to/from stand  Upper Body Dressing  Maximal assistance;Sitting  Upper Body Dressing Details (indicate cue type and reason) Pt did assist with threading arms through sleeves  Lower Body Dressing Total assistance;Bed level  Toilet Transfer Maximal assistance;Stand-pivot;BSC  Toileting- Clothing Manipulation and Hygiene Total assistance  Toileting - Clothing Manipulation Details (indicate cue type and reason) Pt incontinent of urine and was assisted with clean up  Functional mobility during ADLs Maximal assistance  Vision- Assessment  Additional Comments unable to accurately assess this date due to impaired cognition  Perception  Perception Tested? Yes  Praxis  Praxis tested? Deficits  Deficits Initiation;Perseveration  Bed Mobility  Overal bed mobility Needs Assistance  Bed Mobility Rolling;Supine to Sit;Sit to Supine  Rolling Mod assist  Supine to  sit Max assist  Sit to supine Max assist  General bed mobility comments pt requires assist for all aspects  Transfers  Overall transfer level Needs  assistance  Transfers Sit to/from Stand  Sit to Stand Max assist  General transfer comment Pt able to stand with max A with facililtation at hips for extension and knees blocked to prevent buckling  Balance  Overall balance assessment Needs assistance  Sitting-balance support Feet unsupported;Bilateral upper extremity supported  Sitting balance-Leahy Scale Poor  Sitting balance - Comments pt sat EOB with mod A - periods of min guard assist.  Demonstrates Rt lateral lean  OT - End of Session  Activity Tolerance Patient limited by lethargy  Patient left in bed;with call bell/phone within reach;with bed alarm set;with nursing/sitter in room  Nurse Communication Mobility status  OT Assessment  OT Recommendation/Assessment Patient needs continued OT Services  OT Visit Diagnosis Unsteadiness on feet (R26.81);Muscle weakness (generalized) (M62.81);Cognitive communication deficit (R41.841)  OT Problem List Decreased strength;Decreased activity tolerance;Impaired balance (sitting and/or standing);Decreased cognition;Decreased safety awareness;Decreased knowledge of use of DME or AE;Impaired UE functional use  OT Plan  OT Frequency (ACUTE ONLY) Min 2X/week  OT Treatment/Interventions (ACUTE ONLY) Self-care/ADL training;Neuromuscular education;DME and/or AE instruction;Therapeutic activities;Cognitive remediation/compensation;Visual/perceptual remediation/compensation;Patient/family education;Balance training  AM-PAC OT "6 Clicks" Daily Activity Outcome Measure (Version 2)  Help from another person eating meals? 2  Help from another person taking care of personal grooming? 2  Help from another person toileting, which includes using toliet, bedpan, or urinal? 1  Help from another person bathing (including washing, rinsing, drying)? 1  Help from another  person to put on and taking off regular upper body clothing? 2  Help from another person to put on and taking off regular lower body clothing? 1  6 Click Score 9  Progressive Mobility  What is the highest level of mobility based on the progressive mobility assessment? Level 2 (Chairfast) - Balance while sitting on edge of bed and cannot stand  Mobility Sit up in bed/chair position for meals  OT Recommendation  Recommendations for Other Services Rehab consult  Follow Up Recommendations CIR  OT Equipment 3 in 1 bedside commode  Individuals Consulted  Consulted and Agree with Results and Recommendations Patient unable/family or caregiver not available  Acute Rehab OT Goals  Patient Stated Goal none stated  OT Goal Formulation Patient unable to participate in goal setting  Time For Goal Achievement 03/17/21  Potential to Achieve Goals Good  OT Time Calculation  OT Start Time (ACUTE ONLY) 1638  OT Stop Time (ACUTE ONLY) 1658  OT Time Calculation (min) 20 min  OT General Charges  $OT Visit 1 Visit  OT Evaluation  $OT Eval Moderate Complexity 1 Mod  Written Expression  Dominant Hand Right  Eber Jones., OTR/L Acute Rehabilitation Services Pager [phone] Office [phone]

## 2021-03-05 DIAGNOSIS — R569 Unspecified convulsions: Secondary | ICD-10-CM | POA: Diagnosis not present

## 2021-03-05 LAB — GLUCOSE, CAPILLARY: Glucose-Capillary: 120 mg/dL — ABNORMAL HIGH (ref 70–99)

## 2021-03-05 NOTE — TOC Initial Note (Addendum)
Transition of Care [facility]) - Initial/Assessment Note    Patient Details  Name: Arthur [name] MRN: [id number] Date of Birth: 1961-01-23  [facility] [address]) CM/SW Contact:    Delilah Shan, LCSWA Phone Number: 03/05/2021, 12:53 PM  Clinical Narrative:                   CSW received consult for possible SNF placement at time of discharge. Patient currently only oriented to self. CSW spoke with patients spouse Arthur [name] regarding PT recommendation of SNF placement at time of discharge. Patient comes from home with spouse.Patients spouse Arthur [name] expressed understanding of PT recommendation and is agreeable to SNF placement for patient at time of discharge. CSW discussed insurance authorization process.Patients spouse gave CSW permission to fax out initial referral near the [place] area.Patients spouse reports patient has received 1 of the COVID vaccines. No further questions reported at this time. CSW to continue to follow and assist with discharge planning needs.    Expected Discharge Plan: Skilled Nursing Facility Barriers to Discharge: Continued Medical Work up   Patient Goals and CMS Choice Patient states their goals for this hospitalization and ongoing recovery are:: SNF (Only oriented to self spoke with patients spouse Arthur [name]) CMS Medicare.gov Compare Post Acute Care list provided to:: Patient Represenative (must comment) (only oriented to self spoke with patients spouse Arthur [name]) Choice offered to / list presented to : Spouse  Expected Discharge Plan and Services Expected Discharge Plan: Skilled Nursing Facility In-house Referral: Clinical Social Work     Living arrangements for the past 2 months: Single Family Home                                      Prior Living Arrangements/Services Living arrangements for the past 2 months: Single Family Home Lives with:: Self, Spouse Patient language and need for interpreter reviewed:: Yes Do you feel safe going back to the  place where you live?: No   SNF  Need for Family Participation in Patient Care: Yes (Comment) Care giver support system in place?: Yes (comment)   Criminal Activity/Legal Involvement Pertinent to Current Situation/Hospitalization: No - Comment as needed  Activities of Daily Living      Permission Sought/Granted Permission sought to share information with : Case Manager, Family Supports, Magazine features editor Permission granted to share information with : No  Share Information with NAME: Patient only oriented to self spoke with patients spouse Arthur [name]  Permission granted to share info w AGENCY: Patient only oriented to self spoke with patients spouse [name]/SNF  Permission granted to share info w Relationship: Patient only oriented to self spoke with patients spouse Arthur [name]  Permission granted to share info w Contact Information: Patient only oriented to self spoke with patients spouse Arthur [name] [phone]  Emotional Assessment       Orientation: : Oriented to Self Alcohol / Substance Use: Not Applicable Psych Involvement: No (comment)  Admission diagnosis:  Status epilepticus (HCC) [G40.901] Seizures (HCC) [R56.9] Patient Active Problem List   Diagnosis Date Noted   Seizures (HCC) 02/24/2021   Aortic atherosclerosis (HCC) 11/22/2020   History of seizures 11/22/2020   Alcohol use disorder, severe, dependence (HCC) 11/22/2020   Type 2 diabetes mellitus (HCC) 11/22/2020   Anemia 11/22/2020   Thrombocytopenia (HCC) 11/22/2020   Hyperlipidemia 11/22/2020   Acute encephalopathy 10/16/2020   Transaminitis    Essential hypertension 10/08/2019   History of CVA (cerebrovascular accident)  10/02/2019   PCP:  [name], MD Pharmacy:   [facility] [address] [place] [address] Phone: [phone] Fax: [phone]     Social Determinants of Health (SDOH) Interventions    Readmission Risk Interventions No flowsheet  data found.

## 2021-03-05 NOTE — Progress Notes (Signed)
   Subjective: Patient examined at bedside no acute overnight events.  He is sitting up in bed is oriented to person and place but perseverates on certain sentences.  Denies any pain or acute complaints.  Does remain lethargic and slow to follow commands.  Objective:  Vital signs in last 24 hours: Vitals:   03/04/21 2020 03/05/21 0018 03/05/21 0435 03/05/21 0800  BP: (!) 162/102 (!) 148/100 (!) 146/105 (!) 149/93  Pulse: 92 85 88 84  Resp: 20 (!) 22 16 20   Temp: 97.9 F (36.6 C) 97.9 F (36.6 C) 98.3 F (36.8 C) 98.7 F (37.1 C)  TempSrc: Oral Axillary Oral Oral  SpO2: 100% 100% 100% 99%  Weight:      Height:       Constitutional: drowsy, well-appearing, in no acute distress.  HENT: normocephalic, atraumatic, mucous membranes moist Eyes: conjunctiva non-erythematous, extraocular movements intact Cardiovascular: regular rate and rhythm, no m/r/g Pulmonary/Chest: normal work of breathing on room air, lungs clear to auscultation bilaterally Abdominal: soft, non-tender to palpation, non-distended MSK: normal bulk and tone Neurological: alert & oriented x 2 and is able to follow some commands  Skin: warm and dry  Assessment/Plan:  Principal Problem:   Seizures (Arthur [name]) Active Problems:   Essential hypertension   Acute encephalopathy   Type 2 diabetes mellitus (Arthur [name])  Arthur [name] is a 61 y.o. male with PMH significant for seizures, essential HTN, T2DM admitted for AMS secondary to seizures.    Non convulsive status Arthur [name], resolved Seizures Acute Metabolic Encephalopathy  Hx of Seizures Admission EEG consistent with focal non-convulsive status epilepticus arising from left (resolved) has been difficult to control.likely LTM EEG discontinued, MRI with left [facility] sclerosis. Pt is drowsy and slow to follow commands.  Does hemoglobin more confused this morning than yesterday but remains oriented to person and place.   -Neurology consulted, greatly appreciate  recommendations - Keppra 1500 po bid - Phenytoin 100 mg tid - Vimpat 200 mg bid - perampanel 4 mg nightly -Plan to start weaning antiepileptics next week if he remains seizure-free - Frequent neuro checks - Seizure precautions - Versed 2 mg Q4H PRN for seizures - PT/OT following, PT rec SNF and OT rec CIR    HTN BP stable in the 140s this morning -Amlodipine 10 mg daily  -Clonidine 0.1 mg bid   T2DM:  On metformin outpatient. CBGs continue to be controlled on SSI. A1c 6.0, 6.8 4 mo ago.   -Trend CBGs   Best Practice: Diet: Carb-Modified IVF: none  VTE: SCDs Code: Full  Signature: 41, MD  Internal Medicine Resident, PGY-2 Arthur [name] Internal Medicine Residency  Pager: [phone] After 5pm on weekdays and 1pm on weekends: On Call pager [phone]

## 2021-03-05 NOTE — [place] FL2 (Signed)
[place] MEDICAID FL2 LEVEL OF CARE SCREENING TOOL     IDENTIFICATION  Patient Name: LAUREANO HETZER Birthdate: 12-27-1960 Sex: male Admission Date (Current Location): 02/24/2021  [facility] [place] Number:  Producer, television/film/video and Address:  [facility] [place], [address], [place], [place] [address]      Provider Number: [id number]  Attending Physician Name and Address:  Gust Rung, DO  Relative Name and Phone Number:  [name] [phone]    Current Level of Care: Hospital Recommended Level of Care: Skilled Nursing Facility Prior Approval Number:    Date Approved/Denied:   PASRR Number: [id number]  Discharge Plan: SNF    Current Diagnoses: Patient Active Problem List   Diagnosis Date Noted   Seizures (HCC) 02/24/2021   Aortic atherosclerosis (HCC) 11/22/2020   History of seizures 11/22/2020   Alcohol use disorder, severe, dependence (HCC) 11/22/2020   Type 2 diabetes mellitus (HCC) 11/22/2020   Anemia 11/22/2020   Thrombocytopenia (HCC) 11/22/2020   Hyperlipidemia 11/22/2020   Acute encephalopathy 10/16/2020   Transaminitis    Essential hypertension 10/08/2019   History of CVA (cerebrovascular accident) 10/02/2019    Orientation RESPIRATION BLADDER Height & Weight     Self  Normal Incontinent Weight: 187 lb 9.8 oz (85.1 kg) Height:  5\' 3"  (160 cm)  BEHAVIORAL SYMPTOMS/MOOD NEUROLOGICAL BOWEL NUTRITION STATUS      Incontinent Diet (Please see discharge summary)  AMBULATORY STATUS COMMUNICATION OF NEEDS Skin   Limited Assist Verbally Normal (WDL)                       Personal Care Assistance Level of Assistance  Bathing, Feeding, Dressing Bathing Assistance: Limited assistance Feeding assistance: Maximum assistance Dressing Assistance: Limited assistance     Functional Limitations Info  Sight, Hearing, Speech   Hearing Info: Adequate (WDL) Speech Info: Adequate    SPECIAL CARE FACTORS FREQUENCY  PT (By licensed PT),  OT (By licensed OT)     PT Frequency: 5x min weekly OT Frequency: 5x min weekly            Contractures Contractures Info: Not present    Additional Factors Info  Code Status, Allergies, Psychotropic Code Status Info: FULL Allergies Info: Penicillins Psychotropic Info: clonazePAM (KLONOPIN) tablet 1 mg 2 times daily         Current Medications (03/05/2021):  This is the current hospital active medication list Current Facility-Administered Medications  Medication Dose Route Frequency Provider Last Rate Last Admin   acetaminophen (TYLENOL) tablet 650 mg  650 mg Oral Q6H PRN Howerter, Justin B, DO   650 mg at 03/02/21 05/02/21   Or   acetaminophen (TYLENOL) suppository 650 mg  650 mg Rectal Q6H PRN Howerter, Justin B, DO       amLODipine (NORVASC) tablet 10 mg  10 mg Oral Daily Howerter, Justin B, DO   10 mg at 03/05/21 1012   clonazePAM (KLONOPIN) tablet 1 mg  1 mg Oral BID 05/05/21, MD   1 mg at 03/05/21 1012   cloNIDine (CATAPRES) tablet 0.1 mg  0.1 mg Oral BID 05/05/21, MD   0.1 mg at 03/05/21 1012   lacosamide (VIMPAT) tablet 200 mg  200 mg Oral BID 05/05/21, MD   200 mg at 03/05/21 1012   levETIRAcetam (KEPPRA) tablet 1,500 mg  1,500 mg Oral BID 05/05/21, MD   1,500 mg at 03/05/21 1012   midazolam (VERSED) injection 2 mg  2 mg Intravenous Q4H PRN Charlsie Quest, MD       perampanel [facility]) tablet 4 mg  4 mg Oral QHS Charlsie Quest, MD   4 mg at 03/04/21 2107   phenytoin (DILANTIN) chewable tablet 100 mg  100 mg Oral TID [name], MD   100 mg at 03/05/21 1012   senna (SENOKOT) tablet 17.2 mg  2 tablet Oral Daily [name], MD   17.2 mg at 03/05/21 1012     Discharge Medications: Please see discharge summary for a list of discharge medications.  Relevant Imaging Results:  Relevant Lab Results:   Additional Information SSN-[id number], Received 1 Covid Vaccine  Delilah Shan, LCSWA

## 2021-03-05 NOTE — Progress Notes (Signed)
   Subjective: No acute overnight events. Patient examined at bedside. He is sitting up in bed is oriented to person and place but continues to perseverate on certain sentences. Appears to be more alert. Denies any pain or acute complaints.   He has no complaints or concerns at this time.    Objective:  Vital signs in last 24 hours: Vitals:   03/05/21 0800 03/05/21 1141 03/05/21 1600 03/05/21 1941  BP: (!) 149/93 (!) 138/96 (!) 155/100 (!) 176/100  Pulse: 84 81 95 89  Resp: 20  20 20   Temp: 98.7 F (37.1 C) 98.1 F (36.7 C) 98.4 F (36.9 C) (!) 97.4 F (36.3 C)  TempSrc: Oral Axillary Oral Oral  SpO2: 99% 99% 98% 100%  Weight:      Height:       Constitutional: alert, well-appearing, in no acute distress.  HENT: normocephalic, atraumatic, mucous membranes moist Eyes: conjunctiva non-erythematous, extraocular movements intact Cardiovascular: regular rate and rhythm, no m/r/g Pulmonary/Chest: normal work of breathing on room air, lungs LTAB Abdominal: soft, non-tender to palpation, non-distended MSK: normal bulk and tone Neurological: A&O x 2, and is able to follow some commands, but remains unable to identify items such as a straw and pen.  Skin: warm and dry   Assessment/Plan:  Principal Problem:   Seizures (HCC) Active Problems:   Essential hypertension   Acute encephalopathy   Type 2 diabetes mellitus (HCC)  Arthur [name] is a 60 y.o. male with PMH significant for seizures, essential HTN, T2DM admitted for AMS secondary to seizures.    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 LTM EEG discontinued after pt remained seizure, MRI brain w/o acute changes.  Pt more alert and remains oriented to person and place. Ongoing expressive aphasia likely combination of sedative agents  and post ictal state, and can take weeks to months to improve and determine new post status epilepticus baseline. Requires speech rehab. Neuro started phenytoin and parampanel taper, signing off as he require no further IP workup. -Continue Keppra 1500 mg twice daily, Vimpat 200 mg twice daily at d/c - Phenytoin taper ordered  - perampanel taper placed if remains seizure free  - F/u with neurology in 8-12 weeks  - Frequent neuro checks - Seizure precautions - Versed 2 mg Q4H PRN for seizures - PT/OT following, recommending SNF at d/c    HTN BP stable in 140-150s this AM. -Amlodipine 10 mg daily  -Clonidine 0.1 mg bid   T2DM:  On metformin outpatient. CBGs continue to be controlled on SSI -Trend CBGs   Best Practice: Diet: Carb-Modified IVF: none  VTE: SCDs Code: Full  Signature: 10-12, MD  Internal Medicine Resident, PGY-1 [name]  Pager: [phone] After 5pm on weekdays and 1pm on weekends: On Call pager [phone]

## 2021-03-06 DIAGNOSIS — R569 Unspecified convulsions: Secondary | ICD-10-CM | POA: Diagnosis not present

## 2021-03-06 LAB — SARS CORONAVIRUS 2 (TAT 6-24 HRS): SARS Coronavirus 2: NEGATIVE

## 2021-03-06 LAB — GLUCOSE, CAPILLARY
Glucose-Capillary: 133 mg/dL — ABNORMAL HIGH (ref 70–99)
Glucose-Capillary: 124 mg/dL — ABNORMAL HIGH (ref 70–99)

## 2021-03-06 MED ORDER — PHENYTOIN 50 MG PO CHEW
50.0000 mg | CHEWABLE_TABLET | Freq: Three times a day (TID) | ORAL | Status: DC
  Administered 2021-03-11: 50 mg via ORAL
  Filled 2021-03-06: qty 1

## 2021-03-06 MED ORDER — PERAMPANEL 2 MG PO TABS
4.0000 mg | ORAL_TABLET | Freq: Every day | ORAL | Status: DC
  Administered 2021-03-06 – 2021-03-10 (×5): 4 mg via ORAL
  Filled 2021-03-06 (×5): qty 2

## 2021-03-06 MED ORDER — PERAMPANEL 2 MG PO TABS: 2.0000 mg | ORAL_TABLET | Freq: Every day | ORAL | Status: DC

## 2021-03-06 MED ORDER — PHENYTOIN 50 MG PO CHEW: 50.0000 mg | CHEWABLE_TABLET | Freq: Two times a day (BID) | ORAL | Status: DC

## 2021-03-06 MED ORDER — PHENYTOIN 50 MG PO CHEW
75.0000 mg | CHEWABLE_TABLET | Freq: Three times a day (TID) | ORAL | Status: AC
  Administered 2021-03-06 – 2021-03-11 (×15): 75 mg via ORAL
  Filled 2021-03-06 (×16): qty 1.5

## 2021-03-06 MED ORDER — PHENYTOIN 50 MG PO CHEW: 50.0000 mg | CHEWABLE_TABLET | Freq: Every day | ORAL | Status: DC

## 2021-03-06 NOTE — Progress Notes (Signed)
   Subjective: No acute overnight events. Patient examined at bedside. He is sitting up in bed is oriented to person and place but continues to perseverate on certain sentences. Appears to be more alert. Denies any pain or acute complaints.   He has no complaints or concerns at this time.    Objective:  Vital signs in last 24 hours: Vitals:   03/06/21 0356 03/06/21 0400 03/06/21 0757 03/06/21 1148  BP: (!) 142/99  (!) 148/104 (!) 136/94  Pulse: 88  96 92  Resp: (!) 22  18   Temp: (!) 97.5 F (36.4 C)  98.3 F (36.8 C)   TempSrc: Oral  Oral   SpO2: 99%  99% 97%  Weight:  69.4 kg    Height:       Constitutional: more alert, well-appearing, in no acute distress.  HENT: normocephalic, atraumatic, mucous membranes moist Eyes: conjunctiva non-erythematous, extraocular movements intact Cardiovascular: regular rate and rhythm, no m/r/g Pulmonary/Chest: normal work of breathing on room air, lungs LTAB Abdominal: soft, non-tender to palpation, non-distended MSK: normal bulk and tone Neurological: A&O x 2, and is able to follow some commands, but remains unable to identify items such as a straw and pen.  Skin: warm and dry   Assessment/Plan:  Principal Problem:   Seizures (HCC) Active Problems:   Essential hypertension   Acute encephalopathy   Type 2 diabetes mellitus (HCC)  Arthur [name] is a 60 y.o. male with PMH significant for seizures, essential HTN, T2DM admitted for AMS secondary to seizures.   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 LTM EEG discontinued after pt remained seizure, MRI brain w/o acute changes.  Pt more alert and remains oriented to person and place. Ongoing expressive aphasia improving, likely a combination of sedative agents and post ictal state, and can take  weeks to months to improve and determine post status epilepticus baseline. Requires speech rehab. On phenytoin and parampanel taper, neuro signing off as he require no further IP workup. Patient is medically stable for CIR placement.  -Continue Keppra 1500 mg twice daily, Vimpat 200 mg twice daily at d/c - Phenytoin taper ordered  - perampanel taper placed if remains seizure free  - F/u with neurology in 8-12 weeks outpatient  - Frequent neuro checks - Seizure precautions - Versed 2 mg Q4H PRN for seizures - PT/OT following, with updated recommendations for CIR     HTN BP stable in 130-140s this AM. -Amlodipine 10 mg daily  -Clonidine 0.1 mg bid   T2DM:  On metformin outpatient. CBGs continue to be controlled on SSI -Trend CBGs   Best Practice: Diet: Carb-Modified IVF: none  VTE: SCDs Code: Full  Signature: [name], MD  Internal Medicine Resident, PGY-1 [name]  Pager: [phone] After 5pm on weekdays and 1pm on weekends: On Call pager [phone]

## 2021-03-06 NOTE — Progress Notes (Signed)
Subjective: Arthur [name]. No clinical seizures  ROS: Unable to obtain due to aphasia  Examination  Vital signs in last 24 hours: Temp:  [97.4 F (36.3 C)-98.4 F (36.9 C)] 98.3 F (36.8 C) (10/10 0757) Pulse Rate:  [81-96] 96 (10/10 0757) Resp:  [18-22] 18 (10/10 0757) BP: (133-176)/(95-104) 148/104 (10/10 0757) SpO2:  [98 %-100 %] 99 % (10/10 0757) Weight:  [69.4 kg] 69.4 kg (10/10 0400)  General: lying in bed, not in apparent distress CVS: pulse-normal rate and rhythm RS: breathing comfortably, CTAB Extremities: Warm, no apparent ulcers, rash Neuro: awake, alert, oriented to self only, can mimic but not following commands, unable to name objects, perseverating, CN 2-12 grossly intact, spontaneously moving all extremities antigravity  Basic Metabolic Panel: Recent Labs  Lab 02/28/21 0233 03/01/21 0229  NA 136 134*  K 4.1 4.5  CL 104 103  CO2 22 23  GLUCOSE 98 88  BUN 9 14  CREATININE 0.93 1.08  CALCIUM 8.7* 8.7*    CBC: No results for input(s): WBC, NEUTROABS, HGB, HCT, MCV, PLT in the last 168 hours.   Coagulation Studies: No results for input(s): LABPROT, INR in the last 72 hours.  Imaging No new brain imaging overnight  ASSESSMENT AND PLAN:  60 year old male with history of epilepsy who presented with status epilepticus.    Focal nonconvulsive status epilepticus, refractory ( resolved) Aphasia, due to status epilepticus -Patient presented with convulsive focal status epilepticus which has improved but continues to be in nonconvulsive status epilepticus.  The main symptom of his status epilepticus was expressive aphasia.  Status epilepticus resolved on 02/27/2021 afternoon but patient continues to have expressive aphasia  -Etiology of breakthrough seizures: No evidence of infection, drug levels were not tested.  Patient did attend his father's funeral prior to the breakthrough seizures and it is possible that stress was the etiology   Recommendations -Continue Keppra  1500 mg twice daily, Vimpat 200 mg twice daily - Phenytoin taper ordered in epic.  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  Patients with mesial temporal sclerosis can have normal speech.  Therefore I would not attribute his current speech deficits completely to mesial temporal sclerosis.  It is possible that his current aphasia is related to postictal state, antiseizure medications.  Recommend speech rehab - No further inpatient neurologic workup -Continue seizure precautions -Management of rest of comorbidities per primary team - F/u with neuro in 8-12 weeks    I have spent a total of  38 minutes with the patient reviewing hospital notes,  test results, labs and examining the patient as well as establishing an assessment and plan that was discussed personally with the medicine resident.  > 50% of time was spent in direct patient care.   Lindie Spruce Epilepsy [facility] For questions after 5pm please refer to AMION to reach the Neurologist on call

## 2021-03-06 NOTE — Discharge Summary (Addendum)
Name: Arthur [name] MRN: [id number] DOB: 10/09/1960 60 y.o. PCP: [name], MD  Date of Admission: 02/24/2021  3:53 PM Date of Discharge:  03/11/21 Attending Physician: DR. Antony Contras  DISCHARGE DIAGNOSIS:  Primary Problem: Non convulsive status Ellipticus  Hospital Problems: Principal Problem:   Seizures (HCC) Active Problems:   Essential hypertension   Acute encephalopathy   Type 2 diabetes mellitus (HCC)    DISCHARGE MEDICATIONS:   Allergies as of 03/11/2021       Reactions   Penicillins Hives   Did it involve swelling of the face/tongue/throat, SOB, or low BP? Y Did it involve sudden or severe rash/hives, skin peeling, or any reaction on the inside of your mouth or nose? N Did you need to seek medical attention at a hospital or doctor's office? Y When did it last happen?  Over 5 Years Ago     If all above answers are "NO", may proceed with cephalosporin use.        Medication List     TAKE these medications    amLODipine 10 MG tablet Commonly known as: NORVASC Take 1 tablet (10 mg total) by mouth daily.   aspirin EC 81 MG tablet Take 81 mg by mouth daily. Swallow whole.   clonazePAM 1 MG tablet Commonly known as: KLONOPIN Take 1 tablet (1 mg total) by mouth 2 (two) times daily.   cloNIDine 0.1 MG tablet Commonly known as: CATAPRES Take 1 tablet (0.1 mg total) by mouth 2 (two) times daily.   lacosamide 200 MG Tabs tablet Commonly known as: VIMPAT Take 1 tablet (200 mg total) by mouth 2 (two) times daily. What changed:  medication strength how much to take   levETIRAcetam 750 MG tablet Commonly known as: KEPPRA Take 2 tablets (1,500 mg total) by mouth 2 (two) times daily. What changed:  medication strength how much to take   lisinopril 10 MG tablet Commonly known as: ZESTRIL Take 1 tablet (10 mg total) by mouth daily.   metFORMIN 500 MG tablet Commonly known as: Glucophage Take 1 tablet (500 mg total) by mouth 2 (two) times daily with a  meal. IM PROGRAM   multivitamin with minerals Tabs tablet Take 1 tablet by mouth daily.   Perampanel 4 MG Tabs Take 1 tablet (4 mg total) by mouth at bedtime for 5 days.   perampanel 2 MG tablet Commonly known as: FYCOMPA Take 1 tablet (2 mg total) by mouth at bedtime for 10 doses. Start taking on: March 16, 2021   phenytoin 50 MG tablet Commonly known as: DILANTIN Chew 1 tablet (50 mg total) by mouth 3 (three) times daily for 5 days.   phenytoin 50 MG tablet Commonly known as: DILANTIN Chew 1 tablet (50 mg total) by mouth 2 (two) times daily for 5 days. Start taking on: March 16, 2021   phenytoin 50 MG tablet Commonly known as: DILANTIN Chew 1 tablet (50 mg total) by mouth daily for 5 days. Start taking on: March 21, 2021   senna 8.6 MG Tabs tablet Commonly known as: SENOKOT Take 2 tablets (17.2 mg total) by mouth daily.        DISPOSITION AND FOLLOW-UP:  [name] was discharged from [facility] in stable condition. At the hospital follow up visit please address:  Follow-up Recommendations: Consults: Neurology follow up in 8-12 weeks, please call number provided for an appointment.  Labs: BMP in 2 weeks since starting lisinopril 03/11/21 Studies: none Medications:  -Keppra 1500 mg  TWICE DAILY -Vimpat 200 mg TWICE DAILY -Perampanel 4 mg for 5 days, followed by perampanel 2 mg for 10 days. -Phenytoin 50 mg three times a day for 5 days, THEN Phenytoin 50 mg  two times a day for 5 days, THEN Phenytoin 50 mg one time a day for 5 days UNTIL complete.   -Lisinopril 10 mg daily added to better control BP.  -Continue Amlodipine and clonidine  -Continue metformin  Follow-up Appointments:  Follow-up Information     [name], MD. Call in 1 week(s).   Specialty: Internal Medicine Why: to schedule a PCP visit for a post hospitalization visit to better manage your conditions. Contact information: [address] [place]  [address] [phone]         [facility]. Schedule an appointment as soon as possible for a visit in 2 month(s).   Why: Please make a follow up appointment with neurolgist in 2 months for a follow up appointment post this hospitalization. Contact information: [address] [place], [address] [place] [address] [phone]                HOSPITAL COURSE:  Patient Summary: Arthur [name] is a 60 y.o. male with medical history significant for seizures, essential hypertension, type 2 diabetes mellitus admitted for AMS secondary to seizures.    Non convulsive status Ellipticus, resolved Acute Metabolic Encephalopathy  Hx of Seizures:  Presentation likely in the setting of stress/anxiety after the loss of a family member. Does have remote Hx of ETOH use, but per chart review he has not had a drink in months. UDS positive for benzodiazepines.CT head negative, EEG showing cortical dysfunction from L posterior quadrant. TSH wnl. Loaded with keppra and fosphenyotion, requiring titration of several AEDs including keppra, vimpat, phenytoin, perampanel and prn ativan by neurology. 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 LTM EEG done for 3 days until event free, followed by brain MRI, which does not show any acute changes that could explain the ongoing expressive aphasia. Ongoing expressive aphasia likely combination of sedative agents and post ictal state, and can take weeks to months to improve and determine new post status epilepticus baseline. Pt remained seizure free for 2-3 days, so neurology started phenytoin and parampanel taper. Will receive speech therapy at CIR. Pt to follow up with neurology in outpatient setting.   Final neurology recommendations include:  - CONTINUE Keppra 1500 mg twice daily and Vimpat 200 mg twice daily.    FINISH phenytoin and parampanel taper as instructed: - Perampanel 4 mg for 5 days, followed by perampanel 2 mg for 10 days. - Phenytoin 50 mg three times a day for 5 days, THEN Phenytoin 50 mg  two times a day for 5 days, THEN Phenytoin 50 mg one time a day for 5 days UNTIL complete.   PLEASE continue taking all other medications as prescribed.   Pt discharged to CIR.    HTN SBPs stable at 140-150s on home medications.  - Amlodipine 10 mg daily  - Clonidine 0.1 mg bid - Started lisinopril 10 on day of d/c to better control BP. Please follow up with a BMP in 2 weeks for Cr levels.  T2DM  On metformin outpatient. CBGs continue to be controlled on SSI.   - Trend CBGs   Leukocytosis:  Peaked at 11.8 likely in setting of seizure. Resolved.  - Trended CBC   DISCHARGE  INSTRUCTIONS:   Discharge Instructions     Call MD for:  difficulty breathing, headache or visual disturbances   Complete by: As directed    Call MD for:  extreme fatigue   Complete by: As directed    Call MD for:  persistant dizziness or light-headedness   Complete by: As directed    Call MD for:  persistant nausea and vomiting   Complete by: As directed    Call MD for:  redness, tenderness, or signs of infection (pain, swelling, redness, odor or green/yellow discharge around incision site)   Complete by: As directed    Call MD for:  severe uncontrolled pain   Complete by: As directed    Call MD for:  temperature >100.4   Complete by: As directed    Diet - low sodium heart healthy   Complete by: As directed    Increase activity slowly   Complete by: As directed        SUBJECTIVE:  No acute overnight events. Patient examined at bedside. He is sitting up in bed is oriented to person and place, is more interactive and expressive, but continues to perseverate on certain sentences. Expressive aphasia appears to be improving, and expect continued improvement with speech therapy. Denies any pain or acute  complaints. Pt is aware of the plan today and is aware that he is being transferred to CIR today.    He has no complaints or concerns at this time.     Discharge Vitals:   BP 121/83 (BP Location: Left Arm)   Pulse 79   Temp 98.2 F (36.8 C) (Oral)   Resp 19   Ht 5\' 3"  (1.6 m)   Wt 66.4 kg   SpO2 100%   BMI 25.93 kg/m   OBJECTIVE:   Constitutional: more alert, well-appearing, in NAD.  HENT: normocephalic, atraumatic, mucous membranes moist Eyes: conjunctiva non-erythematous, extraocular movements intact Cardiovascular: RRR, no m/r/g Pulmonary/Chest: normal work of breathing on room air, lungs LTAB Abdominal: soft, non-tender to palpation, non-distended MSK: normal bulk and tone Neurological: A&O x 3, is able to follow commands, now able to slowly identify items such as a straw and milk carton.  Skin: warm and dry   Pertinent Labs, Studies, and Procedures:  CBC Latest Ref Rng & Units 02/27/2021 02/26/2021 02/25/2021  WBC 4.0 - 10.5 K/uL 7.9 9.4 11.8(H)  Hemoglobin 13.0 - 17.0 g/dL 69.4 85.4 62.7  Hematocrit 39.0 - 52.0 % 42.7 45.1 45.4  Platelets 150 - 400 K/uL 233 251 274    CMP Latest Ref Rng & Units 03/11/2021 03/10/2021 03/08/2021  Glucose 70 - 99 mg/dL 95 035(K) 093(G)  BUN 6 - 20 mg/dL 17 18(E) 13  Creatinine 0.61 - 1.24 mg/dL 9.93 7.16(R) 6.78  Sodium 135 - 145 mmol/L 131(L) 130(L) 129(L)  Potassium 3.5 - 5.1 mmol/L 4.6 4.3 4.1  Chloride 98 - 111 mmol/L 99 99 98  CO2 22 - 32 mmol/L 24 22 22   Calcium 8.9 - 10.3 mg/dL 8.9 8.9 8.9  Total Protein 6.5 - 8.1 g/dL - - -  Total Bilirubin 0.3 - 1.2 mg/dL - - -  Alkaline Phos 38 - 126 U/L - - -  AST 15 - 41 U/L - - -  ALT 0 - 44 U/L - - -    CT Head Wo Contrast  Result Date: 02/24/2021 CLINICAL DATA:  Generalized anxiety EXAM: CT HEAD WITHOUT CONTRAST TECHNIQUE: Contiguous axial images were obtained from the base of the skull through the vertex  without intravenous contrast. COMPARISON:  10/16/2020 FINDINGS: Brain: No  evidence of acute infarction, hemorrhage, hydrocephalus, extra-axial collection or mass lesion/mass effect. Periventricular and deep white matter hypodensity. Unchanged encephalomalacia of the left parietal lobe and anterior limb of the right internal capsule. Vascular: No hyperdense vessel or unexpected calcification. Skull: Normal. Negative for fracture or focal lesion. Sinuses/Orbits: No acute finding. Other: None. IMPRESSION: 1.  No acute intracranial pathology. 2. 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 Electronically Signed   By: Jearld Lesch M.D.   On: 02/24/2021 17:39   DG Chest Portable 1 View  Result Date: 02/24/2021 CLINICAL DATA:  Anxiety. EXAM: PORTABLE CHEST 1 VIEW COMPARISON:  Oct 16, 2020 FINDINGS: Decreased lung volumes are seen with mild areas of atelectasis noted within the bilateral lung bases. There is no evidence of a pleural effusion or pneumothorax. The cardiac silhouette is moderately enlarged. This may be technical in origin. The visualized skeletal structures are unremarkable. IMPRESSION: Low lung volumes with mild bibasilar atelectasis. Electronically Signed   By: Aram Candela M.D.   On: 02/24/2021 20:11   EEG adult  Result Date: 02/24/2021 Charlsie Quest, MD     02/24/2021  9:16 PM Patient Name: Arthur [name] MRN: [id number] Epilepsy Attending: Charlsie Quest Referring Physician/Provider: Dr Ritta Slot Date: 02/24/2021 Duration: 27.41 mins Patient history: 60yo M with h/o epilepsy, presented with anxiety and had an episode of focal right-sided twitching, right gaze deviation and not following commands. EEG to evaluate for seizure Level of alertness:  lethargic AEDs during EEG study: LEV, LCM Technical aspects: This EEG study was done with scalp electrodes positioned according to the 10-20 International system of electrode placement. Electrical activity was acquired  at a sampling rate of 500Hz  and reviewed with a high frequency filter of 70Hz  and a low frequency filter of 1Hz . EEG data were recorded continuously and digitally stored. Description: EEG showed continuous 2-3hz  rhythmic delta slowing in left hemisphere. There is also 3-5hz  continuous generalized and maximal left posterior quadrant theta-delta slowing admixed with 15-18Hz  generalized beta activity. Hyperventilation and photic stimulation were not performed.   ABNORMALITY - Lateralized rhythmic delta activity, left hemisphere and maximal left posterior quadrant - Continuous slow, generalized and maximal left posterior quadrant IMPRESSION: This study is suggestive of cortical dysfunction arising from left posterior quadrant likely secondary to underlying structural abnormality /stroke, post-ictal state.Of note, lateralized rhythmic delta activity seen in left hemisphere is also on the ictal-interictal continuum with low potential for seizure recurrence. Additionally, there is also moderate diffuse encephalopathy, nonspecific etiology but likely related to seizure. No seizures or definite epileptiform discharges were seen throughout the recording. Dr was notified. Priyanka   Overnight EEG with video  Result Date: 02/25/2021 Derry Lory, MD     02/26/2021  9:07 AM Patient Name: Arthur [name] MRN: Charlsie Quest Epilepsy Attending: 04/28/2021 Referring Physician/Provider: Dr [name] Duration: 02/24/2021 2137 to 02/25/2021 2137  Patient history: 60yo M with h/o epilepsy, presented with anxiety and had an episode of focal right-sided twitching, right gaze deviation and not following commands. EEG to evaluate for seizure  Level of alertness:  lethargic  AEDs during EEG study: LEV, LCM, PHT  Technical aspects: This EEG study was done with scalp electrodes positioned according to the 10-20 International system of electrode placement. Electrical activity was acquired at a sampling rate of  500Hz  and reviewed with a high frequency  filter of 70Hz  and a low frequency filter of 1Hz . EEG data were recorded continuously and digitally stored.  Description: EEG initially showed continuous 2-3hz  rhythmic delta slowing in left hemisphere admixed with sharp waves in left posterior temporal region, maximal P7. At times there was evolution in frequency consistent with brief-ictal-interictal rhythmic discharges. Gradually the eeg pattern showed evolution in morphology ( appeared more sharply contoured admixed with sharp waves) as well as frequency ( fast 15-18hz  beta activity evolving into 2-3hz  rhythmic delta activity. Per review of notes, patient had trouble answering questions. 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 There is also 3-5hz  continuous generalized and maximal left posterior quadrant theta-delta slowing admixed with 15-18Hz  generalized beta activity. Hyperventilation and photic stimulation were not performed.    ABNORMALITY - Focal non convulsive status epilepticus, left posterior quadrant - Brief-ictal-interictal rhythmic discharges ( BIRD) , left posterior quadrant - Lateralized rhythmic delta activity, left hemisphere and maximal left posterior quadrant - Continuous slow, generalized and maximal left posterior quadrant  IMPRESSION: This study initially showed evidence of epileptogenicity and cortical dysfunction arising from left posterior quadrant with brief-ictal-interictal rhythmic discharges. This EEG pattern is on the ictal-interictal continuum with high potential for seizures. Gradually eeg showed evolution in morphology and frequency.  Per review of notes, patient had trouble answering questions. 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  Additionally, there is also moderate diffuse encephalopathy, nonspecific etiology but likely related to seizure.      Signed: , MD Internal Medicine Resident,  PGY-1 [facility]  Pager: [phone] 11:46 AM, 03/11/2021

## 2021-03-07 ENCOUNTER — Telehealth: Payer: Self-pay

## 2021-03-07 DIAGNOSIS — R569 Unspecified convulsions: Secondary | ICD-10-CM | POA: Diagnosis not present

## 2021-03-07 LAB — GLUCOSE, CAPILLARY
Glucose-Capillary: 131 mg/dL — ABNORMAL HIGH (ref 70–99)
Glucose-Capillary: 135 mg/dL — ABNORMAL HIGH (ref 70–99)

## 2021-03-07 MED ORDER — LORAZEPAM 2 MG/ML IJ SOLN
0.5000 mg | Freq: Once | INTRAMUSCULAR | Status: AC
  Administered 2021-03-07: 0.5 mg via INTRAVENOUS
  Filled 2021-03-07: qty 1

## 2021-03-07 NOTE — Progress Notes (Signed)
Pt refused and spit all of his nighttime meds out this shift. Dr. Burnice Logan informed.

## 2021-03-07 NOTE — Progress Notes (Signed)
Pt attempting to get OOB, wanting to go home, confused. [place] ([facility]) informed

## 2021-03-07 NOTE — Progress Notes (Addendum)
Inpatient Rehab Admissions Coordinator:   Per OT recommendations, pt was screened for CIR candidacy by Estill Dooms, PT, DPT.  At this time, we are recommending a CIR consult and I will request an order per our protocol.   Estill Dooms, PT, DPT Admissions Coordinator [phone] 03/07/21  10:24 AM

## 2021-03-07 NOTE — Progress Notes (Addendum)
   Subjective: Paged ON as pt confused and attempting to get out of bed. A&O to self only. 0.5 mg Ativan given by ON team; pt was redirected and lights/TV turned off. Avoided physical restraints.   Patient examined at bedside during rounds this AM. He is sitting up in bed is oriented to person and place but continues to perseverate on certain sentences. Appears to be more alert each day. He is able to ask "when can I go home?" I explained the current plan to him, and he is agreeable. Denies any pain or acute complaints.     Objective:  Vital signs in last 24 hours: Vitals:   03/07/21 0008 03/07/21 0403 03/07/21 0746 03/07/21 1213  BP: 113/64 115/60 (!) 166/102 (!) 144/90  Pulse: 91 85 100 96  Resp: 18 16 18    Temp: 99.1 F (37.3 C) 98.5 F (36.9 C) 97.8 F (36.6 C)   TempSrc: Oral Axillary Oral   SpO2: 97% 98% 94% 99%  Weight:      Height:       Constitutional: more alert, well-appearing, in no acute distress.  HENT: normocephalic, atraumatic, mucous membranes moist Eyes: conjunctiva non-erythematous, extraocular movements intact Cardiovascular: regular rate and rhythm, no m/r/g Pulmonary/Chest: normal work of breathing on room air, lungs LTAB Abdominal: soft, non-tender to palpation, non-distended MSK: normal bulk and tone Neurological: A&O x 2, and is able to follow some commands, but remains unable to identify items such as a straw and pen.  Skin: warm and dry   Assessment/Plan:  Principal Problem:   Seizures (HCC) Active Problems:   Essential hypertension   Acute encephalopathy   Type 2 diabetes mellitus (HCC)  Arthur [name] is a 60 y.o. male with PMH significant for seizures, essential HTN, T2DM admitted for AMS secondary to seizures.   Non convulsive status Ellipticus, resolved Acute Metabolic Encephalopathy  Hx of Seizures Pt more alert and remains oriented to person and place. Expressive aphasia improving as AEDs weaned. Can take weeks to months to improve and  determine new baseline. On phenytoin and parampanel taper, neuro signed off, as he require no further IP workup. Patient is medically stable for CIR placement, pending bed offer.  - Keppra 1500 mg bid and Vimpat 200 mg bid (will continue at d/c) - Phenytoin taper  - perampanel taper if remains seizure free  - F/u with neurology in 8-12 weeks outpatient - Speech rehab referral at d/c  - Frequent neuro checks - Seizure precautions - Versed 2 mg Q4H PRN for seizures - PT/OT following, with updated recommendations for CIR   - CIR consulted    HTN SBP increased 150-160's this AM. -Amlodipine 10 mg daily  -Clonidine 0.1 mg bid   T2DM:  On metformin outpatient. CBGs continue to be controlled on SSI -Trend CBGs   Best Practice: Diet: Carb-Modified IVF: none  VTE: SCDs Code: Full  Signature: 10-12, MD  Internal Medicine Resident, PGY-1 [name]  Pager: [phone] After 5pm on weekdays and 1pm on weekends: On Call pager [phone]

## 2021-03-07 NOTE — Progress Notes (Signed)
Physical Therapy Treatment Patient Details Name: Arthur [name] MRN: [id number] DOB: 10-Feb-1961 Today's Date: 03/07/2021   History of Present Illness 60 y.o. male admitted 02/24/2021 with seizure after presenting from home to [facility] ED complaining of anxiety had an episode of focal right-sided twitching, right gaze deviation and not following commands. Pt with multiple seizures and onset of AMS during admittance,, EEG show evidence of epileptogenicity and cortical dysfunction arising from left posterior quadrant. MRI 03/03/21 shows Increased T2 signal, decreased volume and blurring of internal structures of the left hippocampus suggesting mesial temporal sclerosis. PMH:  epileptic seizures, essential hypertension, type 2 diabetes mellitus.    PT Comments    Pt received in supine, A&O to self only but agreeable to therapy session and with good participation and tolerance for transfer and pre-gait training. Pt benefits from multimodal cues and does better with reciprocal practice for safety with transfers. Pt needing mod to maxA for sit<>stand and heavy +1 maxA for face to face stand pivot to [facility] and then with RW for pivotal steps to chair. Pt with improved alertness this date and eager to mobilize, with good activity tolerance. Of note, pt spouse [name] called asking about pt level of confusion and wanting update from medical team, RN notified. Pt continues to benefit from PT services to progress toward functional mobility goals. Discussed pt progress with pt spouse and supervising PT Beth [facility], updated frequency and DC recommendation per pt progress to higher intensity post-acute rehab.  Recommendations for follow up therapy are one component of a multi-disciplinary discharge planning process, led by the attending physician.  Recommendations may be updated based on patient status, additional functional criteria and insurance authorization.  Follow Up Recommendations  Supervision/Assistance - 24  hour;CIR;Supervision for mobility/OOB     Equipment Recommendations  None recommended by PT (has RW from 5/22 admission, will continue to assess)    Recommendations for Other Services OT consult;Speech consult (cognitive consult)     Precautions / Restrictions Precautions Precautions: Fall Precaution Comments: high fall risk/impulsivity Restrictions Weight Bearing Restrictions: No     Mobility  Bed Mobility Overal bed mobility: Needs Assistance Bed Mobility: Rolling;Supine to Sit;Sit to Supine Rolling: Min assist   Supine to sit: Min assist     General bed mobility comments: pt requires min assist and multimodal cues, increased time.    Transfers Overall transfer level: Needs assistance Equipment used: Rolling walker (2 wheeled);None Transfers: Sit to/from [employer] Sit to Stand: Max assist;Mod assist Stand pivot transfers: Max assist       General transfer comment: Pt able to stand from EOB with +1 maxA initially (attempted with RW but pt leans posteriorly and unable to manage properly). Face to face stepping and pivoting to [facility]. After sitting ~5 mins, pt stood with RW and maxA to pivot to recliner ~69ft from [facility] then x5 STS from recliner<>RW with modA after practice.  Ambulation/Gait Ambulation/Gait assistance: Max assist Gait Distance (Feet): 4 Feet Assistive device: Rolling walker (2 wheeled) Gait Pattern/deviations: Step-to pattern;Shuffle;Decreased step length - right;Decreased dorsiflexion - right;Decreased dorsiflexion - left;Leaning posteriorly     General Gait Details: max multimodal cues for step sequencing, pt needs max to totalA to manage RW, pt needs some manual assist to advance legs when turning/stepping, needs more assist for RLE than for LLE.       Balance Overall balance assessment: Needs assistance Sitting-balance support: Bilateral upper extremity supported;Feet supported Sitting balance-Leahy Scale: Fair Sitting balance -  Comments: pt needing minA to min  guard for safety due to impulsivity/confusion   Standing balance support: Bilateral upper extremity supported;During functional activity Standing balance-Leahy Scale: Zero Standing balance comment: requires UE support and heavy external assist, initially maxA for static standing progressed to modA              Cognition Arousal/Alertness: Awake/alert;Lethargic;Suspect due to medications Behavior During Therapy: [facility] for tasks assessed/performed Overall Cognitive Status: Impaired/Different from baseline Area of Impairment: Attention;Orientation;Following commands;Problem solving;Awareness           Orientation Level: Disoriented to;Place;Time;Situation Current Attention Level: Sustained   Following Commands: Follows one step commands inconsistently;Follows one step commands with increased time     Problem Solving: Slow processing;Decreased initiation;Difficulty sequencing;Requires verbal cues;Requires tactile cues General Comments: Pt followed one step motor commands ~70% of the time with a delay and does better with tactile/gestural cues. Pt frequently making nonsensical responses to simple questions but able to state his favorite football team and answer some simple yes/no questions. Pt unable to state city/location, reason for admission or year/month.      Exercises Other Exercises Other Exercises: STS x5 reciprocal reps Other Exercises: seated BLE AROM: hip flexion, LAQ x10 reps ea Other Exercises: standing BLE AROM: hip flexion x10 reps    General Comments General comments (skin integrity, edema, etc.): VSS per chart review, no acute s/sx distress and not otherwise assessed; pt incontinent and had doffed his condom cath, NT notified. Pt assisted from [facility] to chair and will need new catheter.      Pertinent Vitals/Pain Pain Assessment: Faces Faces Pain Scale: No hurt Pain Intervention(s): Repositioned;Monitored during session     PT  Goals (current goals can now be found in the care plan section) Acute Rehab PT Goals Patient Stated Goal: none stated PT Goal Formulation: Patient unable to participate in goal setting Time For Goal Achievement: 03/17/21 Progress towards PT goals: Progressing toward goals    Frequency    Min 4X/week      PT Plan Discharge plan needs to be updated;Frequency needs to be updated       AM-PAC PT "6 Clicks" Mobility   Outcome Measure  Help needed turning from your back to your side while in a flat bed without using bedrails?: A Little Help needed moving from lying on your back to sitting on the side of a flat bed without using bedrails?: A Little Help needed moving to and from a bed to a chair (including a wheelchair)?: A Lot Help needed standing up from a chair using your arms (e.g., wheelchair or bedside chair)?: A Lot Help needed to walk in hospital room?: Total Help needed climbing 3-5 steps with a railing? : Total 6 Click Score: 12    End of Session Equipment Utilized During Treatment: Gait belt Activity Tolerance: Patient tolerated treatment well Patient left: with call bell/phone within reach;in chair;with chair alarm set Nurse Communication: Mobility status;Precautions;Other (comment) (pt spouse [name] called, wants a call back; pt need a new condom cath) PT Visit Diagnosis: Other abnormalities of gait and mobility (R26.89);Difficulty in walking, not elsewhere classified (R26.2);Other symptoms and signs involving the nervous system (R29.898)     Time: 9485-4627 PT Time Calculation (min) (ACUTE ONLY): 36 min  Charges:  $Therapeutic Exercise: 8-22 mins $Therapeutic Activity: 8-22 mins                     Leigh Kaeding P., PTA Acute Rehabilitation Services Pager: [phone] Office: [phone]    Angus Palms 03/07/2021, 2:41 PM

## 2021-03-07 NOTE — Progress Notes (Signed)
Kit has climbed out of bed x4 in the last hour. Easily redirected to the bed but at times difficult to get him back into the bed.

## 2021-03-07 NOTE — Progress Notes (Addendum)
Paged by RN, patient confused and attempting to get out of bed saying that "I need to go".    Patient is alert and oriented to self but not place or time.  When asked what his date of birth is he replies "that is too much" and when asked where he is, he states "child support". He is mildly agitated but is able to be redirected to sit on bed.  Will give 0.5 mg Ativan and keep lights and TV off.  Would avoid physical restraints for now.

## 2021-03-08 DIAGNOSIS — R569 Unspecified convulsions: Secondary | ICD-10-CM | POA: Diagnosis not present

## 2021-03-08 LAB — BASIC METABOLIC PANEL
Anion gap: 9 (ref 5–15)
BUN: 13 mg/dL (ref 6–20)
CO2: 22 mmol/L (ref 22–32)
Calcium: 8.9 mg/dL (ref 8.9–10.3)
Chloride: 98 mmol/L (ref 98–111)
Creatinine, Ser: 1.11 mg/dL (ref 0.61–1.24)
GFR, Estimated: >60 mL/min (ref >60)
Glucose, Bld: 120 mg/dL — ABNORMAL HIGH (ref 70–99)
Potassium: 4.1 mmol/L (ref 3.5–5.1)
Sodium: 129 mmol/L — ABNORMAL LOW (ref 135–145)

## 2021-03-08 LAB — GLUCOSE, CAPILLARY
Glucose-Capillary: 138 mg/dL — ABNORMAL HIGH (ref 70–99)
Glucose-Capillary: 141 mg/dL — ABNORMAL HIGH (ref 70–99)
Glucose-Capillary: 132 mg/dL — ABNORMAL HIGH (ref 70–99)

## 2021-03-08 MED ORDER — LISINOPRIL 20 MG PO TABS
40.0000 mg | ORAL_TABLET | Freq: Every day | ORAL | Status: DC
  Administered 2021-03-08: 40 mg via ORAL
  Filled 2021-03-08: qty 2

## 2021-03-08 NOTE — Progress Notes (Signed)
Paged MD per pt BP 175/116, manual recheck 166/84, order parameters were SBP<160 and patient had no PRN's, orders received to change parameters to SBP<180

## 2021-03-08 NOTE — PMR Pre-admission (Signed)
PMR Admission Coordinator Pre-Admission Assessment  Patient: Arthur [name] is an 60 y.o., male MRN: [id number] DOB: 11-08-1960 Height: 5' 3" (160 cm) Weight: 71.1 kg  Insurance Information HMO:     PPO:      PCP:      IPA:      80/20:      OTHER:  PRIMARY: [facility]       Policy#: [id number]    COE: MADNN  Subscriber: Pt.  CM Name:      Phone#:      Fax#:  Pre-Cert#:       Employer:  Benefits:  Phone #:      Name:  Eff. Date: effective 03/08/21     Deduct: $0      Out of Pocket Max: $0      Life Max: n/a CIR: $0      SNF: $0 Outpatient: $0     Co-Pay: $0 Home Health: $0      Co-Pay: $0  DME: $0     Co-Pay: $0  Providers:  in network  SECONDARY:       Policy#:      Phone#:   Development worker, community:       Phone#:   [employer]" for patients in Inpatient Rehabilitation Facilities with attached "Privacy Act [place] Records" was provided and verbally reviewed with: Family  Emergency Contact Information Contact Information     Name Relation Home Work [place] [phone]         Current Medical History  Patient Admitting Diagnosis: Seizure, Acute Metabolic Encephalopathy History of Present Illness: Arthur [name] is a 60 y.o. male with medical history significant for seizures, essential hypertension, type 2 diabetes mellitus, who is admitted to [facility] on 02/24/2021 with seizure after presenting from home to [facility] ED complaining of anxiety. EEG show evidence of epileptogenicity and cortical dysfunction arising from left posterior quadrant. MRI 03/03/21 shows Increased T2 signal, decreased volume and blurring of internal structures of the left hippocampus suggesting mesial temporal sclerosis. 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 LTM EEG done for 3 days until event free, followed by  brain MRI, which does not show any acute changes that could explain the ongoing expressive aphasia. Ongoing expressive aphasia likely combination of sedative agents and post ictal state, and can take weeks to months to improve and determine new post status epilepticus baseline. Pt remained seizure free for 2-3 days, so neurology started phenytoin and parampanel taper. Pt. With persistent encephalopathy, mobility deficit,  and expressive aphasia. CIR consulted to assist return to PLOF.     Patient's medical record from [facility] has been reviewed by the rehabilitation admission coordinator and physician.  Past Medical History  Past Medical History:  Diagnosis Date   DM2 (diabetes mellitus, type 2) ([place])    ETOH abuse    Hypertension    Seizures ([place])     Has the patient had major surgery during 100 days prior to admission? No  Family History   family history includes Diabetes in his father and mother; Hypertension in his father, mother, and sister.  Current Medications  Current Facility-Administered Medications:    acetaminophen (TYLENOL) tablet 650 mg, 650 mg, Oral, Q6H PRN, 650 mg at 03/02/21 0953 **OR** acetaminophen (TYLENOL) suppository 650 mg, 650 mg, Rectal, Q6H PRN, [name], Arthur B, DO  amLODipine (NORVASC) tablet 10 mg, 10 mg, Oral, Daily, [name], Arthur B, DO, 10 mg at 03/07/21 5427   clonazePAM (KLONOPIN) tablet 1 mg, 1 mg, Oral, BID, Arthur Doom, MD, 1 mg at 03/07/21 2141   cloNIDine (CATAPRES) tablet 0.1 mg, 0.1 mg, Oral, BID, Arthur Beard, MD, 0.1 mg at 03/07/21 2101   lacosamide (VIMPAT) tablet 200 mg, 200 mg, Oral, BID, Arthur Manes, MD, 200 mg at 03/07/21 2140   levETIRAcetam (KEPPRA) tablet 1,500 mg, 1,500 mg, Oral, BID, Arthur Manes, MD, 1,500 mg at 03/07/21 2141   midazolam (VERSED) injection 2 mg, 2 mg, Intravenous, Q4H PRN, Arthur Havens, MD   perampanel [facility]) tablet 4 mg, 4 mg, Oral, QHS, 4 mg at 03/07/21 2141 **FOLLOWED  BY** [START ON 03/16/2021] perampanel (FYCOMPA) tablet 2 mg, 2 mg, Oral, QHS, Arthur Havens, MD   phenytoin (DILANTIN) chewable tablet 75 mg, 75 mg, Oral, TID, 75 mg at 03/07/21 2140 **FOLLOWED BY** [START ON 03/11/2021] phenytoin (DILANTIN) chewable tablet 50 mg, 50 mg, Oral, TID **FOLLOWED BY** [START ON 03/16/2021] phenytoin (DILANTIN) chewable tablet 50 mg, 50 mg, Oral, BID **FOLLOWED BY** [START ON 03/21/2021] phenytoin (DILANTIN) chewable tablet 50 mg, 50 mg, Oral, Daily, Arthur Havens, MD   senna (SENOKOT) tablet 17.2 mg, 2 tablet, Oral, Daily, Arthur Manes, MD, 17.2 mg at 03/07/21 0623  Patients Current Diet:  Diet Order             Diet Carb Modified Fluid consistency: Thin; Room service appropriate? Yes  Diet effective now                   Precautions / Restrictions Precautions Precautions: Fall Precaution Comments: high fall risk/impulsivity Restrictions Weight Bearing Restrictions: No   Has the patient had 2 or more falls or a fall with injury in the past year? No  Prior Activity Level Community (5-7x/wk): Pt. was active in the community PTA  Prior Functional Level Self Care: Did the patient need help bathing, dressing, using the toilet or eating? Independent  Indoor Mobility: Did the patient need assistance with walking from room to room (with or without device)? Independent  Stairs: Did the patient need assistance with internal or external stairs (with or without device)? Independent  Functional Cognition: Did the patient need help planning regular tasks such as shopping or remembering to take medications? Needed some help  Patient Information Are you of Hispanic, Latino/a,or Spanish origin?: A. No, not of Hispanic, Latino/a, or Spanish origin What is your race?: B. Black or African American Do you need or want an interpreter to communicate with a doctor or health care staff?: 0. No  Patient's Response To:  Health Literacy and Transportation Is the  patient able to respond to health literacy and transportation needs?: No Health Literacy - How often do you need to have someone help you when you read instructions, pamphlets, or other written material from your doctor or pharmacy?: Patient unable to respond  [place] / Equipment Home Equipment: Walker - 2 wheels  Prior Device Use: Indicate devices/aids used by the patient prior to current illness, exacerbation or injury? None of the above  Current Functional Level Cognition  Overall Cognitive Status: Impaired/Different from baseline Difficult to assess due to: Impaired communication Current Attention Level: Sustained Orientation Level: Oriented to person, Oriented to place Following Commands: Follows one step commands inconsistently, Follows one step commands with increased time General Comments: Pt followed one step motor commands ~70% of the time with a  delay and does better with tactile/gestural cues. Pt frequently making nonsensical responses to simple questions but able to state his favorite football team and answer some simple yes/no questions. Pt unable to state city/location, reason for admission or year/month.    Extremity Assessment (includes Sensation/Coordination)  Upper Extremity Assessment: Generalized weakness  Lower Extremity Assessment: Defer to PT evaluation    ADLs  Overall ADL's : Needs assistance/impaired Eating/Feeding: Maximal assistance, Sitting, Bed level Grooming: Wash/dry hands, Wash/dry face, Moderate assistance, Sitting Upper Body Bathing: Total assistance, Sitting Lower Body Bathing: Total assistance, Sit to/from stand Upper Body Dressing : Maximal assistance, Sitting Upper Body Dressing Details (indicate cue type and reason): Pt did assist with threading arms through sleeves Lower Body Dressing: Total assistance, Bed level Toilet Transfer: Maximal assistance, Stand-pivot, BSC Toileting- Clothing Manipulation and Hygiene: Total  assistance Toileting - Clothing Manipulation Details (indicate cue type and reason): Pt incontinent of urine and was assisted with clean up Functional mobility during ADLs: Maximal assistance    Mobility  Overal bed mobility: Needs Assistance Bed Mobility: Rolling, Supine to Sit, Sit to Supine Rolling: Min assist Supine to sit: Min assist Sit to supine: Max assist General bed mobility comments: pt requires min assist and multimodal cues, increased time.    Transfers  Overall transfer level: Needs assistance Equipment used: Rolling walker (2 wheeled), None Transfers: Sit to/from Stand, Stand Pivot Transfers Sit to Stand: Max assist, Mod assist Stand pivot transfers: Max assist General transfer comment: Pt able to stand from EOB with +1 maxA initially (attempted with RW but pt leans posteriorly and unable to manage properly). Face to face stepping and pivoting to [facility]. After sitting ~5 mins, pt stood with RW and maxA to pivot to [facility]iner [facility] from recliner<>RW with modA after practice.    Ambulation / Gait / Stairs / Wheelchair Mobility  Ambulation/Gait Ambulation/Gait assistance: Max aWeb designer(Feet): 4 Feet Assistive device: Rolling walker (2 wheeled) Gait Pattern/deviations: Step-to pattern, Shuffle, Decreased step length - right, Decreased dorsiflexion - right, Decreased dorsiflexion - left, Leaning posteriorly General Gait Details: max multimodal cues for step sequencing, pt needs max to totalA to manage RW, pt needs some manual assist to advance legs when turning/stepping, needs more assist for RLE than for LLE.    Posture / Balance Dynamic Sitting Balance Sitting balance - Comments: pt needing minA to min guard for safety due to impulsivity/confusion Balance Overall balance assessment: Needs assistance Sitting-balance support: Bilateral upper extremity supported, Feet supported Sitting balance-Leahy Scale: Fair Sitting balance - Comments: pt  needing minA to min guard for safety due to impulsivity/confusion Standing balance support: Bilateral upper extremity supported, During functional activity Standing balance-Leahy Scale: Zero Standing balance comment: requires UE support and heavy external assist, initially maxA for static standing progressed to modA    Special needs/care consideration None   Previous Home Environment (from acute therapy documentation) Living Arrangements: Spouse/significant other Available Help at Discharge: Family Type of Home: House Home Layout: One level Home Access: Stairs to enter ECenterPoint Energyof Steps: 2 Bathroom Shower/Tub: WMultimedia programmer Handicapped height Bathroom Accessibility: Yes How Accessible: [place] Additional Comments: information collected from admittance 09/2020  Discharge Living Setting Plans for Discharge Living Setting: Patient's home Type of Home at Discharge: House Discharge Home Layout: One level Discharge Home Access: Stairs to enter Entrance Stairs-Rails: Can reach both Entrance Stairs-Number of Steps: 2 Discharge Bathroom Shower/Tub: Walk-in shower Discharge Bathroom Toilet: Handicapped height  Discharge Bathroom Accessibility: Yes How Accessible: Accessible via walker  Social/Family/Support Systems Patient Roles: Spouse Contact Information: [phone] Anticipated Caregiver: [name] Anticipated Caregiver's Contact Information: [phone] Ability/Limitations of Caregiver: Can provide Mod A Caregiver Availability: 24/7 Discharge Plan Discussed with Primary Caregiver: Yes Is Caregiver In Agreement with Plan?: Yes Does Caregiver/Family have Issues with Lodging/Transportation while Pt is in Rehab?: Yes  Goals Patient/Family Goal for Rehab: PT/OT Mod A, SLP Min A Expected length of stay: 10-12 days Pt/Family Agrees to Admission and willing to participate: Yes Program Orientation Provided &  Reviewed with Pt/Caregiver Including Roles  & Responsibilities: Yes  Decrease burden of Care through IP rehab admission: Specialzed equipment needs, Decrease number of caregivers, Bowel and bladder program, and Patient/family education  Possible need for SNF placement upon discharge: not anticipated   Patient Condition: I have reviewed medical records from [facility], spoken with CM, and patient and spouse. I met with patient at the bedside for inpatient rehabilitation assessment.  Patient will benefit from ongoing PT, OT, and SLP, can actively participate in 3 hours of therapy a day 5 days of the week, and can make measurable gains during the admission.  Patient will also benefit from the coordinated team approach during an Inpatient Acute Rehabilitation admission.  The patient will receive intensive therapy as well as Rehabilitation physician, nursing, social worker, and care management interventions.  Due to safety, skin/wound care, disease management, medication administration, pain management, and patient education the patient requires 24 hour a day rehabilitation nursing.  The patient is currently min A-min guard with mobility and basic ADLs.  Discharge setting and therapy post discharge at home with home health is anticipated.  Patient has agreed to participate in the Acute Inpatient Rehabilitation Program and will admit today.  Preadmission Screen Completed By:  Genella Mech, 03/08/2021 9:39 AM ______________________________________________________________________   Discussed status with Dr. Posey Pronto  on 03/11/21 at 44 and received approval for admission today.  Admission Coordinator:  Genella Mech, CCC-SLP, time 945/Date 03/11/21   Assessment/Plan: Diagnosis: Acute Metabolic Encephalopathy Does the need for close, 24 hr/day Medical supervision in concert with the patient's rehab needs make it unreasonable for this patient to be served in a less intensive setting? Yes Co-Morbidities  requiring supervision/potential complications: seizures, essential hypertension, type 2 diabetes mellitus Due to safety, disease management, and patient education, does the patient require 24 hr/day rehab nursing? Yes Does the patient require coordinated care of a physician, rehab nurse, PT, OT, and SLP to address physical and functional deficits in the context of the above medical diagnosis(es)? Yes Addressing deficits in the following areas: balance, endurance, locomotion, strength, transferring, toileting, cognition, and psychosocial support Can the patient actively participate in an intensive therapy program of at least 3 hrs of therapy 5 days a week? Yes The potential for patient to make measurable gains while on inpatient rehab is excellent and good Anticipated functional outcomes upon discharge from inpatient rehab: supervision PT, supervision OT, supervision SLP Estimated rehab length of stay to reach the above functional goals is: 6-9 days. Anticipated discharge destination: Home 10. Overall Rehab/Functional Prognosis: excellent and good   MD Signature: Delice Lesch, MD, ABPMR

## 2021-03-08 NOTE — Progress Notes (Signed)
Physical Therapy Treatment Patient Details Name: Arthur [name] MRN: [id number] DOB: [birth year] Today's Date: 03/08/2021   History of Present Illness 60 y.o. male admitted 02/24/2021 with seizure after presenting from home to [facility] ED complaining of anxiety had an episode of focal right-sided twitching, right gaze deviation and not following commands. Pt with multiple seizures and onset of AMS during admittance,, EEG show evidence of epileptogenicity and cortical dysfunction arising from left posterior quadrant. MRI 03/03/21 shows Increased T2 signal, decreased volume and blurring of internal structures of the left hippocampus suggesting mesial temporal sclerosis. PMH:  epileptic seizures, essential hypertension, type 2 diabetes mellitus.    PT Comments    Pt received in supine, alert and cooperative, unable to state last name or location/type of facility and perseverating on certain conversation topics. Pt able to state last name when written and placed in front of him. Pt following simple 1-step mobility commands fairly well this date but slow processing and with posterolateral lean to L side with seated/standing tasks. Pt needs +2 mod/maxA for dynamic standing tasks and manual assist for weight shifting to take steps up to 26ft at a time prior to needing seated break.. Pt continues to benefit from PT services to progress toward functional mobility goals. Continue to recommend CIR.  Recommendations for follow up therapy are one component of a multi-disciplinary discharge planning process, led by the attending physician.  Recommendations may be updated based on patient status, additional functional criteria and insurance authorization.  Follow Up Recommendations  Supervision/Assistance - 24 hour;CIR;Supervision for mobility/OOB     Equipment Recommendations  None recommended by PT;Other (comment) (has RW from 5/22 admission)    Recommendations for Other Services Other (comment);Speech consult (cognitive  consult)     Precautions / Restrictions Precautions Precautions: Fall Precaution Comments: high fall risk/impulsivity Restrictions Weight Bearing Restrictions: No     Mobility  Bed Mobility Overal bed mobility: Needs Assistance Bed Mobility: Supine to Sit;Sit to Supine     Supine to sit: Min assist Sit to supine: Min guard   General bed mobility comments: min A to adjust hips to EOB with use of chuck - min guard to get back into bed. supervision for long sit in bed from supine.    Transfers Overall transfer level: Needs assistance Equipment used: 2 person hand held assist Transfers: Sit to/from Stand Sit to Stand: Mod assist;+2 safety/equipment         General transfer comment: mod A +1 for lift assist to stand (+2 for safety); pt with L lean while standing needing support to L side mostly  Ambulation/Gait Ambulation/Gait assistance: Max assist;Mod assist;+2 physical assistance;+2 safety/equipment Gait Distance (Feet): 6 Feet (52ft + 72ft and a few sidesteps) Assistive device: 2 person hand held assist Gait Pattern/deviations: Step-to pattern;Shuffle;Decreased step length - right;Decreased dorsiflexion - right;Decreased dorsiflexion - left;Leaning posteriorly     General Gait Details: max multimodal cues for step sequencing, pt needs some tactile cues/ manual assist to advance legs when stepping, needs more assist for lateral steps due to cognition, does better with forward/backward stepping at bedside (28ft fwd/backward at most 35ft total)       Balance Overall balance assessment: Needs assistance Sitting-balance support: Feet supported;Single extremity supported Sitting balance-Leahy Scale: Fair Sitting balance - Comments: Pt with L lateral lean, required verbal cues to become aware of lean and to find mindline but able to improve posture with cues Postural control: Left lateral lean;Posterior lean Standing balance support: Bilateral upper extremity  supported Standing balance-Leahy Scale:  Poor Standing balance comment: reliant on therapist external support +2 and weight shifting assist with stepping                            Cognition Arousal/Alertness: Awake/alert Behavior During Therapy: WFL for tasks assessed/performed Overall Cognitive Status: Impaired/Different from baseline Area of Impairment: Orientation;Attention;Memory;Following commands;Safety/judgement;Awareness;Problem solving                 Orientation Level: Disoriented to;Place;Time;Situation Current Attention Level: Sustained Memory: Decreased short-term memory Following Commands: Follows one step commands inconsistently Safety/Judgement: Decreased awareness of deficits;Decreased awareness of safety Awareness: Intellectual Problem Solving: Slow processing;Decreased initiation;Difficulty sequencing;Requires verbal cues General Comments: Pt followed simple functional commands wtih increased time and cueing. He was unable to correctly answer simple cognitive questions or give immediate recall. After the Cowboys were brought to conversation he perseverated on "cowboys" as his answer for most questions. Pt unable to state last name but when written on paper in front of him, he could read it back and was able to write his name on paper under where therapist wrote it but some dysgraphia noted.      Exercises Other Exercises Other Exercises: STS from EOB x3 Other Exercises: seated BLE AROM: hip flexion x5 reps, pt confused needed heavy cues to perform properly, posterior lean    General Comments General comments (skin integrity, edema, etc.): BP 156/100 supine -taken manually by RN; BP 170/110 seated via dinamap; BP 174/102 (124) standing with RUE bent so reading may be slightly inaccurate; SpO2 WFL, HR tachy to 125 bpm with standing      Pertinent Vitals/Pain Pain Assessment: Faces Faces Pain Scale: No hurt Pain Intervention(s): Monitored during  session;Repositioned     PT Goals (current goals can now be found in the care plan section) Acute Rehab PT Goals Patient Stated Goal: none stated PT Goal Formulation: Patient unable to participate in goal setting Time For Goal Achievement: 03/17/21 Progress towards PT goals: Progressing toward goals    Frequency    Min 4X/week      PT Plan Current plan remains appropriate    Co-evaluation PT/OT/SLP Co-Evaluation/Treatment: Yes Reason for Co-Treatment: Complexity of the patient's impairments (multi-system involvement);Necessary to address cognition/behavior during functional activity;For patient/therapist safety;To address functional/ADL transfers PT goals addressed during session: Mobility/safety with mobility;Balance;Strengthening/ROM        AM-PAC PT "6 Clicks" Mobility   Outcome Measure  Help needed turning from your back to your side while in a flat bed without using bedrails?: A Little Help needed moving from lying on your back to sitting on the side of a flat bed without using bedrails?: A Little Help needed moving to and from a bed to a chair (including a wheelchair)?: A Lot Help needed standing up from a chair using your arms (e.g., wheelchair or bedside chair)?: A Lot Help needed to walk in hospital room?: Total (+2 maxA) Help needed climbing 3-5 steps with a railing? : Total 6 Click Score: 12    End of Session Equipment Utilized During Treatment: Gait belt Activity Tolerance: Patient tolerated treatment well;Other (comment) (cognition limiting mobility progress, LE strength remains good R>L) Patient left: with call bell/phone within reach;in bed;with bed alarm set;Other (comment) (bed in chair position) Nurse Communication: Mobility status PT Visit Diagnosis: Other abnormalities of gait and mobility (R26.89);Difficulty in walking, not elsewhere classified (R26.2);Other symptoms and signs involving the nervous system (Q59.563)     Time: 8756-4332 PT Time  Calculation (min) (ACUTE  ONLY): 28 min  Charges:  $Gait Training: 8-22 mins                     Madox Corkins P., PTA Acute Rehabilitation Services Pager: [phone] Office: [phone]    Angus Palms 03/08/2021, 5:55 PM

## 2021-03-08 NOTE — Progress Notes (Addendum)
   Subjective: No overnight events.  Patient examined at bedside during rounds this AM. He is sitting up in bed is oriented to person and place but continues to perseverate on certain sentences. Continues to be more alert each day.  I explained the current plan to him, and he is agreeable. Denies any pain or acute complaints.     Objective:  Vital signs in last 24 hours: Vitals:   03/08/21 0351 03/08/21 0750 03/08/21 0806 03/08/21 1138  BP: (!) 167/107 (!) 201/100 136/72 (!) 175/116  Pulse: 87 98  (!) 113  Resp: 18 (!) 22 20 20   Temp: 98.5 F (36.9 C) 97.8 F (36.6 C)  99.7 F (37.6 C)  TempSrc:  Oral  Oral  SpO2: 100% 98%  99%  Weight: 71.1 kg     Height:       Constitutional: more alert, well-appearing, in NAD.  HENT: normocephalic, atraumatic, mucous membranes moist Eyes: conjunctiva non-erythematous, extraocular movements intact Cardiovascular: regular rate and rhythm, no m/r/g Pulmonary/Chest: normal work of breathing on room air, lungs LTAB Abdominal: soft, non-tender to palpation, non-distended MSK: normal bulk and tone Neurological: A&O x 2, and is able to follow some commands, but remains unable to identify items such as a straw and pen.  Skin: warm and dry   Assessment/Plan:  Principal Problem:   Seizures (HCC) Active Problems:   Essential hypertension   Acute encephalopathy   Type 2 diabetes mellitus (HCC)  Arthur [name] is a 60 y.o. male with PMH significant for seizures, essential HTN, T2DM admitted for AMS secondary to seizures.   Non convulsive status Ellipticus, resolved Acute Metabolic Encephalopathy  Hx of Seizures Pt more alert and oriented to person and place. Expressive aphasia improving as AEDs weaned. Can take weeks to months to improve and determine new baseline. On phenytoin and parampanel taper, neuro signed off, as he require no further IP workup. Pt medically stable for CIR placement, pending insurance auth and bed offer.  - Keppra 1500 mg  bid and Vimpat 200 mg bid (will continue at d/c) - Phenytoin taper  - perampanel taper  - F/u with neurology in 8-12 weeks outpatient - Speech rehab referral at d/c  - Frequent neuro checks - Seizure precautions - Versed 2 mg Q4H PRN for seizures - PT/OT following, with updated recommendations for CIR   - CIR consulted    HTN SBP stable 150-160's this AM with a drop to 80/60 after starting lisinopril 40mg  yesterday evening - Discontinue lisinopril  - LR 500 mL bolus -Amlodipine 10 mg daily  -Clonidine 0.1 mg bid -CTM vitals   T2DM:  On metformin outpatient. CBGs continue to be controlled on SSI.  -Trend CBGs   Best Practice: Diet: Carb-Modified IVF: none  VTE: SCDs Code: Full  Signature: 10-12, MD  Internal Medicine Resident, PGY-1 Internal Medicine Residency  Pager: [phone] After 5pm on weekdays and 1pm on weekends: On Call pager [phone]

## 2021-03-08 NOTE — Progress Notes (Signed)
Inpatient Rehab Admissions Coordinator:   I do not have a bed for this Pt. On CIR today. Will continue to follow for potential admit pending insurance auth and bed availability.   Codee Tutson, MS, CCC-SLP Rehab Admissions Coordinator  [phone] (celll) [phone] (office)  

## 2021-03-08 NOTE — Progress Notes (Signed)
Occupational Therapy Treatment Patient Details Name: Arthur [name] MRN: [id number] DOB: Apr 21, 1961 Today's Date: 03/08/2021   History of present illness 60 y.o. male admitted 02/24/2021 with seizure after presenting from home to [facility] ED complaining of anxiety had an episode of focal right-sided twitching, right gaze deviation and not following commands. Pt with multiple seizures and onset of AMS during admittance,, EEG show evidence of epileptogenicity and cortical dysfunction arising from left posterior quadrant. MRI 03/03/21 shows Increased T2 signal, decreased volume and blurring of internal structures of the left hippocampus suggesting mesial temporal sclerosis. PMH:  epileptic seizures, essential hypertension, type 2 diabetes mellitus.   OT comments  [place] is progressing towards his functional goals. Session completed with PT to address cognition/behavior, safety and OOB activity. Pt was min A overall for bed mobility, and mod A +1 for static sit<>Stand; mod/max A +2 for short ambulation with HH assist. Pt demonstrated great ability to reach bilat feet in log sitting at bed level. He was pleasantly confused throughout, following functional commands well, but unable to answer simple questions "what is your last name" or have immediate recall of answers.  Pt continues to benefit from OT acutely. D/c plan remains appropriate.    Recommendations for follow up therapy are one component of a multi-disciplinary discharge planning process, led by the attending physician.  Recommendations may be updated based on patient status, additional functional criteria and insurance authorization.    Follow Up Recommendations  CIR    Equipment Recommendations  3 in 1 bedside commode    Recommendations for Other Services Rehab consult    Precautions / Restrictions Precautions Precautions: Fall Precaution Comments: high fall risk/impulsivity Restrictions Weight Bearing Restrictions: No       Mobility Bed  Mobility Overal bed mobility: Needs Assistance Bed Mobility: Supine to Sit;Sit to Supine     Supine to sit: Min assist Sit to supine: Min guard   General bed mobility comments: min A to adjust hips to EOB with use of chuck - min guard to get back into bed    Transfers Overall transfer level: Needs assistance Equipment used: 2 person hand held assist Transfers: Sit to/from Stand Sit to Stand: Mod assist         General transfer comment: mod A +1 for sit<>stand; mod/max A +2 for short ambualtion - pt noted to have more weakness on L    Balance Overall balance assessment: Needs assistance Sitting-balance support: Feet supported;Single extremity supported Sitting balance-Leahy Scale: Fair Sitting balance - Comments: Pt with L lateral lean, requried verbal cues to become aware of lean and to find mindline Postural control: Left lateral lean Standing balance support: Bilateral upper extremity supported Standing balance-Leahy Scale: Poor Standing balance comment: reliant on therapists                           ADL either performed or assessed with clinical judgement   ADL Overall ADL's : Needs assistance/impaired                     Lower Body Dressing: Minimal assistance;Bed level Lower Body Dressing Details (indicate cue type and reason): pt able to don socks in long sitting while sitting in the bed with simple verbal cues and min A for assistance wtih L sock Toilet Transfer: Moderate assistance;+2 for physical assistance;+2 for safety/equipment Toilet Transfer Details (indicate cue type and reason): simulated - short 10ft ambulation with +2 HH assist  Functional mobility during ADLs: Moderate assistance;+2 for physical assistance;+2 for safety/equipment General ADL Comments: pt limited by confusion, L sided weakness and continues to required +2 assist for all OOB tasks     Vision   Vision Assessment?: No apparent visual deficits           Cognition Arousal/Alertness: Awake/alert Behavior During Therapy: WFL for tasks assessed/performed Overall Cognitive Status: Impaired/Different from baseline Area of Impairment: Orientation;Attention;Memory;Following commands;Safety/judgement;Awareness;Problem solving                 Orientation Level: Disoriented to;Place;Time;Situation Current Attention Level: Sustained Memory: Decreased short-term memory Following Commands: Follows one step commands inconsistently Safety/Judgement: Decreased awareness of deficits;Decreased awareness of safety Awareness: Intellectual Problem Solving: Slow processing;Decreased initiation;Difficulty sequencing;Requires verbal cues General Comments: Pt followed simple functional commands wtih incrsaed time and cueing. He was unable to correctly answer simple cognitive questions or give immediate recall. After the Cowboys were brought to conversation he perseverated on "cowboys" as his answer for most questions.        Exercises     Shoulder Instructions       General Comments VSS on RA - pt left with table top task of writing his name, demonstrated good understanding    Pertinent Vitals/ Pain       Pain Assessment: Faces Faces Pain Scale: No hurt Pain Intervention(s): Monitored during session   Frequency  Min 2X/week        Progress Toward Goals  OT Goals(current goals can now be found in the care plan section)  Progress towards OT goals: Progressing toward goals  Acute Rehab OT Goals Patient Stated Goal: none stated OT Goal Formulation: Patient unable to participate in goal setting Time For Goal Achievement: 03/17/21 Potential to Achieve Goals: Good ADL Goals Pt Will Perform Eating: with modified independence;sitting Pt Will Perform Grooming: with min assist;standing Pt Will Perform Upper Body Bathing: with set-up;with supervision;sitting Pt Will Perform Lower Body Bathing: with min assist;sit to/from stand Pt Will  Perform Upper Body Dressing: with min assist;sitting Pt Will Perform Lower Body Dressing: with min assist;sit to/from stand Pt Will Transfer to Toilet: with min assist;stand pivot transfer;bedside commode Pt Will Perform Toileting - Clothing Manipulation and hygiene: with min assist;sit to/from stand Additional ADL Goal #1: Pt will sustain attention to simple, familiar ADL task x 4 mins with min cues.  Plan Discharge plan remains appropriate       AM-PAC OT "6 Clicks" Daily Activity     Outcome Measure   Help from another person eating meals?: A Lot Help from another person taking care of personal grooming?: A Lot Help from another person toileting, which includes using toliet, bedpan, or urinal?: A Lot Help from another person bathing (including washing, rinsing, drying)?: A Lot Help from another person to put on and taking off regular upper body clothing?: A Lot Help from another person to put on and taking off regular lower body clothing?: A Lot 6 Click Score: 12    End of Session Equipment Utilized During Treatment: Gait belt  OT Visit Diagnosis: Unsteadiness on feet (R26.81);Muscle weakness (generalized) (M62.81);Cognitive communication deficit (R41.841)   Activity Tolerance Patient tolerated treatment well   Patient Left in bed;with call bell/phone within reach;with bed alarm set   Nurse Communication Mobility status        Time: 3818-2993 OT Time Calculation (min): 24 min  Charges: OT General Charges $OT Visit: 1 Visit OT Treatments $Self Care/Home Management : 8-22 mins   Kharma Sampsel A Jatniel Verastegui 03/08/2021,  5:31 PM

## 2021-03-08 NOTE — Progress Notes (Signed)
Paged MD, pt BP 182/116 manual recheck 156/100, MD orders for labs received

## 2021-03-08 NOTE — Plan of Care (Signed)
  Problem: Education: Goal: Expressions of having a comfortable level of knowledge regarding the disease process will increase Outcome: Progressing   Problem: Coping: Goal: Ability to adjust to condition or change in health will improve Outcome: Progressing Goal: Ability to identify appropriate support needs will improve Outcome: Progressing   Problem: Health Behavior/Discharge Planning: Goal: Compliance with prescribed medication regimen will improve Outcome: Progressing   Problem: Medication: Goal: Risk for medication side effects will decrease Outcome: Progressing   Problem: Clinical Measurements: Goal: Complications related to the disease process, condition or treatment will be avoided or minimized Outcome: Progressing   Problem: Safety: Goal: Verbalization of understanding the information provided will improve Outcome: Progressing   Problem: Self-Concept: Goal: Level of anxiety will decrease Outcome: Progressing   

## 2021-03-08 NOTE — Progress Notes (Signed)
Pt's spouse agreed to come sit w/ pt. She arrived on the unit around 2300. Pt still confused, but did not attempt to get OOB during her time visiting. She left around 0000 while pt was resting. Pt noted to have some elevated Bps throughout this shift, asymptomatic. Dr. Estevan Ryder ([facility]) informed and he suggested to conti to monitor d/t pt not being symptomatic.

## 2021-03-09 ENCOUNTER — Telehealth: Payer: Self-pay

## 2021-03-09 DIAGNOSIS — R569 Unspecified convulsions: Secondary | ICD-10-CM | POA: Diagnosis not present

## 2021-03-09 LAB — GLUCOSE, CAPILLARY
Glucose-Capillary: 126 mg/dL — ABNORMAL HIGH (ref 70–99)
Glucose-Capillary: 127 mg/dL — ABNORMAL HIGH (ref 70–99)
Glucose-Capillary: 132 mg/dL — ABNORMAL HIGH (ref 70–99)

## 2021-03-09 MED ORDER — LACTATED RINGERS IV BOLUS
500.0000 mL | Freq: Once | INTRAVENOUS | Status: AC
  Administered 2021-03-09: 500 mL via INTRAVENOUS

## 2021-03-09 NOTE — Progress Notes (Signed)
   Subjective: No overnight events. Patient examined at bedside during rounds this AM. He was laying in bed sleeping. He remains oriented to person and place but continues to perseverate on certain sentences. Continues to be more alert each day.  I explained the current plan to him, and he is agreeable. Denies any pain or acute complaints.     Objective:  Vital signs in last 24 hours: Vitals:   03/09/21 0323 03/09/21 0437 03/09/21 0700 03/09/21 0835  BP: 94/64   (!) 80/60  Pulse: 84  92   Resp: 16  17   Temp: 99.4 F (37.4 C)  98.3 F (36.8 C)   TempSrc: Oral  Oral   SpO2: 100%  97%   Weight:  71.1 kg    Height:       Constitutional: more alert, well-appearing, in NAD.  HENT: normocephalic, atraumatic, mucous membranes moist Eyes: conjunctiva non-erythematous, extraocular movements intact Cardiovascular: RRR, no m/r/g Pulmonary/Chest: normal work of breathing on room air, lungs LTAB Abdominal: soft, non-tender to palpation, non-distended MSK: normal bulk and tone Neurological: A&O x 2, and is able to follow some commands, but remains unable to identify items such as a straw and pen.  Skin: warm and dry   Assessment/Plan:  Principal Problem:   Seizures (HCC) Active Problems:   Essential hypertension   Acute encephalopathy   Type 2 diabetes mellitus (HCC)  Arthur [name] is a 60 y.o. male with PMH significant for seizures, essential HTN, T2DM admitted for AMS secondary to seizures.   Non convulsive status Ellipticus, resolved Acute Metabolic Encephalopathy  Hx of Seizures Pt more alert and oriented to person and place. Expressive aphasia improving as AEDs weaned. Can take weeks to months to improve and determine new baseline. Neurology signed off, as he require no further IP workup.  - Keppra 1500 mg bid and Vimpat 200 mg bid (will continue at d/c) - Phenytoin taper  - perampanel taper  - Seizure precautions - Versed 2 mg Q4H PRN for seizures - F/u with neurology in  8-12 weeks outpatient - Speech rehab   - PT/OT following, with updated recommendations for CIR   - Pt medically stable for CIR placement, pending insurance auth and bed offer. TOC to expand search to surrounding cities.   HTN SBP stable 120-140's this AM  -Amlodipine 10 mg daily  -Clonidine 0.1 mg bid -Will resume lisinopril 10 once Cr returns to baseline    T2DM:  On metformin outpatient. CBGs continue to be controlled on SSI.  -Trend CBGs   Best Practice: Diet: Carb-Modified IVF: none  VTE: SCDs Code: Full   Signature: [name], MD  Internal Medicine Resident, PGY-1 [name] Res[facility]  Pager: [phone] After 5pm on weekdays and 1pm on weekends: On Call pager [phone]

## 2021-03-09 NOTE — Plan of Care (Signed)
°  Problem: Education: °Goal: Expressions of having a comfortable level of knowledge regarding the disease process will increase °Outcome: Progressing °  °Problem: Coping: °Goal: Ability to adjust to condition or change in health will improve °Outcome: Progressing °  °Problem: Health Behavior/Discharge Planning: °Goal: Compliance with prescribed medication regimen will improve °Outcome: Progressing °  °Problem: Medication: °Goal: Risk for medication side effects will decrease °Outcome: Progressing °  °Problem: Clinical Measurements: °Goal: Complications related to the disease process, condition or treatment will be avoided or minimized °Outcome: Progressing °  °Problem: Safety: °Goal: Verbalization of understanding the information provided will improve °Outcome: Progressing °  °Problem: Self-Concept: °Goal: Level of anxiety will decrease °Outcome: Progressing °  °

## 2021-03-09 NOTE — Progress Notes (Signed)
MD paged due to patient BP of 80/60 manual after several rechecks at 0835. Patient asymptomatic and assessment unchanged. Orders received for of LR, BP monitored q2 hrs with 1035 BP of 85/58 and patient still asymptomatic, MD notified. BP rechecked after bolus completion with BP of 106/65. MD updated and no new orders placed at this time.

## 2021-03-09 NOTE — Progress Notes (Signed)
Physical Therapy Treatment Patient Details Name: Arthur [name] MRN: [id number] DOB: 09/20/1960 Today's Date: 03/09/2021   History of Present Illness 60 y.o. male admitted 02/24/2021 with seizure after presenting from home to [facility] ED complaining of anxiety had an episode of focal right-sided twitching, right gaze deviation and not following commands. Pt with multiple seizures and onset of AMS during admittance,, EEG show evidence of epileptogenicity and cortical dysfunction arising from left posterior quadrant. MRI 03/03/21 shows Increased T2 signal, decreased volume and blurring of internal structures of the left hippocampus suggesting mesial temporal sclerosis. PMH:  epileptic seizures, essential hypertension, type 2 diabetes mellitus.    PT Comments    Pt received up in bathroom on toilet, agreeable to gait progression and transfer training. Pt with good progress toward goals this date and improved following of simple mobility commands this date and needing minA for transfers/gait in room. Pt with improved step length/height with HHA and minA for short gait trial (although continues to take very small low steps) in room, pt had difficulty managing RW 2/2 cognition. Pt continues to benefit from PT services to progress toward functional mobility goals.   Recommendations for follow up therapy are one component of a multi-disciplinary discharge planning process, led by the attending physician.  Recommendations may be updated based on patient status, additional functional criteria and insurance authorization.  Follow Up Recommendations  Supervision/Assistance - 24 hour;CIR;Supervision for mobility/OOB     Equipment Recommendations  None recommended by PT;Other (comment) (has RW from 5/22 admission)    Recommendations for Other Services Other (comment);Speech consult (cognitive consult)     Precautions / Restrictions Precautions Precautions: Fall Precaution Comments: high fall  risk/impulsivity Restrictions Weight Bearing Restrictions: No     Mobility  Bed Mobility               General bed mobility comments: pt received on toilet in bathroom and remained up in chair at end of session    Transfers Overall transfer level: Needs assistance Equipment used: Rolling walker (2 wheeled);1 person hand held assist Transfers: Sit to/from Stand Sit to Stand: Min assist         General transfer comment: pt needs hand over hand assist to utilize wall rail when standing from toilet, and needs some manual assist to manage RW and cues not to abandon AD when getting close to chair. Pt did better with transfers/hand placement safety when not using RW.  Ambulation/Gait Ambulation/Gait assistance: Min assist Gait Distance (Feet): 30 Feet (18, 80ft with seated break) Assistive device: 1 person hand held assist;Rolling walker (2 wheeled) Gait Pattern/deviations: Step-to pattern;Shuffle;Decreased step length - right;Decreased dorsiflexion - left;Decreased dorsiflexion - right;Narrow base of support     General Gait Details: pt with very short/low steps, near festinating gait pattern when using RW this date but improved upright posture. Pt did better with single UE hand-held assist on second gait trial but maintains short step-to gait pattern.   Stairs             Wheelchair Mobility    Modified Rankin (Stroke Patients Only)       Balance Overall balance assessment: Needs assistance Sitting-balance support: Feet supported;Single extremity supported Sitting balance-Leahy Scale: Fair     Standing balance support: Bilateral upper extremity supported;Single extremity supported;During functional activity Standing balance-Leahy Scale: Poor Standing balance comment: +1 for static/dynamic standing tasks this date  Cognition Arousal/Alertness: Awake/alert Behavior During Therapy: WFL for tasks assessed/performed Overall  Cognitive Status: Impaired/Different from baseline Area of Impairment: Orientation;Attention;Memory;Following commands;Safety/judgement;Awareness;Problem solving                 Orientation Level: Disoriented to;Place;Time;Situation Current Attention Level: Sustained Memory: Decreased short-term memory Following Commands: Follows one step commands with increased time;Follows one step commands inconsistently Safety/Judgement: Decreased awareness of deficits;Decreased awareness of safety Awareness: Intellectual Problem Solving: Slow processing;Decreased initiation;Difficulty sequencing;Requires verbal cues;Requires tactile cues General Comments: Pt followed simple functional commands wtih increased time and cueing. Initially responding appropriately to questions about his day and activities and preferences, but after second gait trial/when more fatigued, he was unable to correctly answer simple cognitive questions or give immediate recall. Pt smiling and responds well to positive reinforcement on his progress.      Exercises      General Comments General comments (skin integrity, edema, etc.): BP 117/84 (95) seated in chair post-exertion, no dizziness reported; HR 93 bpm      Pertinent Vitals/Pain Pain Assessment: No/denies pain Faces Pain Scale: No hurt Pain Intervention(s): Monitored during session;Repositioned    Home Living                      Prior Function            PT Goals (current goals can now be found in the care plan section) Acute Rehab PT Goals Patient Stated Goal: none stated PT Goal Formulation: Patient unable to participate in goal setting Time For Goal Achievement: 03/17/21 Progress towards PT goals: Progressing toward goals    Frequency    Min 4X/week      PT Plan Current plan remains appropriate    Co-evaluation              AM-PAC PT "6 Clicks" Mobility   Outcome Measure  Help needed turning from your back to your side  while in a flat bed without using bedrails?: A Little Help needed moving from lying on your back to sitting on the side of a flat bed without using bedrails?: A Little Help needed moving to and from a bed to a chair (including a wheelchair)?: A Little Help needed standing up from a chair using your arms (e.g., wheelchair or bedside chair)?: A Little Help needed to walk in hospital room?: A Lot (variable +1-2 assist depending on cognition) Help needed climbing 3-5 steps with a railing? : Total 6 Click Score: 15    End of Session Equipment Utilized During Treatment: Gait belt Activity Tolerance: Patient tolerated treatment well Patient left: with call bell/phone within reach;in chair;with chair alarm set (food tray set up in front of him to eat) Nurse Communication: Mobility status PT Visit Diagnosis: Other abnormalities of gait and mobility (R26.89);Difficulty in walking, not elsewhere classified (R26.2);Other symptoms and signs involving the nervous system (R29.898)     Time: 3716-9678 PT Time Calculation (min) (ACUTE ONLY): 26 min  Charges:  $Gait Training: 8-22 mins $Therapeutic Activity: 8-22 mins                     Aneyah Lortz P., PTA Acute Rehabilitation Services Pager: [phone] Office: [phone]    Angus Palms 03/09/2021, 7:07 PM

## 2021-03-09 NOTE — Progress Notes (Signed)
IP rehab admissions - No beds open on CIR today for this patient, but I will have my partner follow up again tomorrow for possible admit to CIR pending bed availability and medical readiness.  Call for questions.  [phone]

## 2021-03-10 DIAGNOSIS — R569 Unspecified convulsions: Secondary | ICD-10-CM | POA: Diagnosis not present

## 2021-03-10 LAB — BASIC METABOLIC PANEL
Anion gap: 9 (ref 5–15)
BUN: 23 mg/dL — ABNORMAL HIGH (ref 6–20)
CO2: 22 mmol/L (ref 22–32)
Calcium: 8.9 mg/dL (ref 8.9–10.3)
Chloride: 99 mmol/L (ref 98–111)
Creatinine, Ser: 1.3 mg/dL — ABNORMAL HIGH (ref 0.61–1.24)
GFR, Estimated: >60 mL/min (ref >60)
Glucose, Bld: 114 mg/dL — ABNORMAL HIGH (ref 70–99)
Potassium: 4.3 mmol/L (ref 3.5–5.1)
Sodium: 130 mmol/L — ABNORMAL LOW (ref 135–145)

## 2021-03-10 LAB — GLUCOSE, CAPILLARY
Glucose-Capillary: 119 mg/dL — ABNORMAL HIGH (ref 70–99)
Glucose-Capillary: 121 mg/dL — ABNORMAL HIGH (ref 70–99)
Glucose-Capillary: 130 mg/dL — ABNORMAL HIGH (ref 70–99)
Glucose-Capillary: 100 mg/dL — ABNORMAL HIGH (ref 70–99)

## 2021-03-10 NOTE — Progress Notes (Signed)
Inpatient Rehab Admissions Coordinator:   I do not have a CIR bed for this Pt. Today. I will pursue for potential admit pending bed availability. If Pt. Is ready to d/c, may consider d/c home with [facility] or outpatient PT/OT. TOC aware that beds are limited through the weekend and next week.   Megan Salon, MS, CCC-SLP Rehab Admissions Coordinator  [phone] (celll) [phone] (office)

## 2021-03-10 NOTE — Progress Notes (Signed)
Physical Therapy Treatment Patient Details Name: Arthur [name] MRN: [id number] DOB: Nov 13, 1960 Today's Date: 03/10/2021   History of Present Illness 60 y.o. male admitted 02/24/2021 with seizure after presenting from home to [facility] ED complaining of anxiety had an episode of focal right-sided twitching, right gaze deviation and not following commands. Pt with multiple seizures and onset of AMS during admittance,, EEG show evidence of epileptogenicity and cortical dysfunction arising from left posterior quadrant. MRI 03/03/21 shows Increased T2 signal, decreased volume and blurring of internal structures of the left hippocampus suggesting mesial temporal sclerosis. PMH:  epileptic seizures, essential hypertension, type 2 diabetes mellitus.    PT Comments    Pt received in supine, awake and oriented to self but unable to state city/specific location, pt pleasantly cooperative and agreeable to gait progression in hallway. Pt motivated to progress to longer gait trial and able to perform with min guard to minA, asking to try without hand held assist toward end of trial. Pt steadier with better step length with single UE supported, may trial standard cane next session as RW is too cumbersome for him to manage. Pt slightly less perseverative today and able to answer simple questions but at times states "[place]" in response to unrelated questions. Pt continues to benefit from PT services to progress toward functional mobility goals.    Recommendations for follow up therapy are one component of a multi-disciplinary discharge planning process, led by the attending physician.  Recommendations may be updated based on patient status, additional functional criteria and insurance authorization.  Follow Up Recommendations  Supervision/Assistance - 24 hour;CIR;Supervision for mobility/OOB     Equipment Recommendations  None recommended by PT;Other (comment) (has RW from 5/22 admission)    Recommendations for Other  Services Other (comment);Speech consult (cognitive consult)     Precautions / Restrictions Precautions Precautions: Fall Precaution Comments: high fall risk/impulsivity Restrictions Weight Bearing Restrictions: No     Mobility  Bed Mobility Overal bed mobility: Needs Assistance Bed Mobility: Supine to Sit     Supine to sit: Min guard;HOB elevated     General bed mobility comments: good initiation, min cues for technique/bed rail use and increased time to perform    Transfers Overall transfer level: Needs assistance Equipment used: 1 person hand held assist;None Transfers: Sit to/from Stand Sit to Stand: Min assist;Min guard         General transfer comment: minA initially to rise with posterior bias and pt relying on support of bed frame contacting BLE but for stand/sit pt needing min guard at most and cues to reach back for armrest/safety  Ambulation/Gait Ambulation/Gait assistance: Min assist;Min guard Gait Distance (Feet): 75 Feet (9ft, seated break, 28ft with multiple brief standing breaks) Assistive device: None;1 person hand held assist Gait Pattern/deviations: Step-to pattern;Decreased dorsiflexion - left;Decreased dorsiflexion - right;Narrow base of support;Step-through pattern Gait velocity: <0.2 m/s Gait velocity interpretation: <1.31 ft/sec, indicative of household ambulator General Gait Details: pt with very short/low shuffled steps initially but with dense cueing and BIG/LOUD cues ("LEFT foot, RIGHT foot") pt with improved step length/height. Pt mostly minA with HHA but able to progress to min guard with no UE support for final 21ft, but more cautious/low steps when unsupported.   Stairs             Wheelchair Mobility    Modified Rankin (Stroke Patients Only)       Balance Overall balance assessment: Needs assistance Sitting-balance support: Feet supported;Single extremity supported Sitting balance-Leahy Scale: Fair  Standing balance  support: Single extremity supported;No upper extremity supported;During functional activity Standing balance-Leahy Scale: Fair Standing balance comment: able to progress to min guard briefly for unsupported stepping, mostly minA                            Cognition Arousal/Alertness: Awake/alert Behavior During Therapy: WFL for tasks assessed/performed Overall Cognitive Status: Impaired/Different from baseline Area of Impairment: Orientation;Memory;Safety/judgement;Awareness;Problem solving                 Orientation Level: Disoriented to;Place;Time;Situation Current Attention Level: Sustained Memory: Decreased short-term memory Following Commands: Follows one step commands consistently Safety/Judgement: Decreased awareness of deficits;Decreased awareness of safety Awareness: Emergent Problem Solving: Requires verbal cues General Comments: Pt followed simple functional commands this date but unable to name hospital or city, reinforced this with him. His spouse [name] arrived to room at beginning of session and very encouraging. Pt smiling and responds well to positive reinforcement on his progress, recalling to his mother on phone "I might be able to get out of here today" although pt notified it may be a little longer.             Pertinent Vitals/Pain Pain Assessment: No/denies pain Faces Pain Scale: No hurt Pain Intervention(s): Monitored during session     PT Goals (current goals can now be found in the care plan section) Acute Rehab PT Goals Patient Stated Goal: none stated PT Goal Formulation: Patient unable to participate in goal setting Time For Goal Achievement: 03/17/21 Progress towards PT goals: Progressing toward goals    Frequency    Min 4X/week      PT Plan Current plan remains appropriate       AM-PAC PT "6 Clicks" Mobility   Outcome Measure  Help needed turning from your back to your side while in a flat bed without using  bedrails?: A Little Help needed moving from lying on your back to sitting on the side of a flat bed without using bedrails?: A Little Help needed moving to and from a bed to a chair (including a wheelchair)?: A Little Help needed standing up from a chair using your arms (e.g., wheelchair or bedside chair)?: A Little Help needed to walk in hospital room?: A Little Help needed climbing 3-5 steps with a railing? : A Lot 6 Click Score: 17    End of Session Equipment Utilized During Treatment: Gait belt Activity Tolerance: Patient tolerated treatment well Patient left: with call bell/phone within reach;in chair;with chair alarm set;Other (comment);with family/visitor present (wall cord present in room but when bed cord detached to place chair alarm to wall, bed alarm sounds repeatedly. unit secretary/RN notified, pt spouse in room as well and chair alarm functioning just not sounding to Estate agent) Nurse Communication: Mobility status PT Visit Diagnosis: Other abnormalities of gait and mobility (R26.89);Difficulty in walking, not elsewhere classified (R26.2);Other symptoms and signs involving the nervous system (R29.898)     Time: 6283-1517 PT Time Calculation (min) (ACUTE ONLY): 23 min  Charges:  $Gait Training: 8-22 mins $Therapeutic Activity: 8-22 mins                     Evo Aderman P., PTA Acute Rehabilitation Services Pager: [phone] Office: [phone]    Angus Palms 03/10/2021, 2:37 PM

## 2021-03-10 NOTE — TOC Progression Note (Signed)
Transition of Care [facility]) - Progression Note    Patient Details  Name: Arthur [name] MRN: [id number] Date of Birth: [birth year]  Transition of Care [facility]) CM/SW Contact  Baldemar Lenis, [place] Phone Number: 03/10/2021, 1:30 PM  Clinical Narrative:   CSW alerted by CIR that there are no beds available for patient. Patient still has no SNF bed offers. CSW faxed patient out further, and reached out to [facility] to ask about bed availability, as well. No beds available at [facility] until next week, but Smitty Cords is concerned that patient may improve to be able to go home prior to a bed being available. CSW spoke with patient's wife to provide update, and wife is concerned about the patient being moved so far away for rehab, would really like him in [place], if possible. CSW discussed possibility of patient improving to return home, and wife agreeable for patient to return home if he is cleared for home health. CSW to see how patient improves and follow back up on bed availability at [facility] on Monday.    Expected Discharge Plan: Skilled Nursing Facility Barriers to Discharge: Continued Medical Work up  Expected Discharge Plan and Services Expected Discharge Plan: Skilled Nursing Facility In-house Referral: Clinical Social Work     Living arrangements for the past 2 months: Single Family Home                                       Social Determinants of Health (SDOH) Interventions    Readmission Risk Interventions No flowsheet data found.

## 2021-03-11 ENCOUNTER — Inpatient Hospital Stay (HOSPITAL_COMMUNITY)
Admission: RE | Admit: 2021-03-11 | Discharge: 2021-03-24 | DRG: 092 | Disposition: A | Discharge status: Home Health | Payer: Medicaid Other | Source: Intra-hospital | Attending: Physical Medicine & Rehabilitation | Admitting: Physical Medicine & Rehabilitation

## 2021-03-11 ENCOUNTER — Other Ambulatory Visit: Payer: Self-pay

## 2021-03-11 ENCOUNTER — Encounter (HOSPITAL_COMMUNITY): Payer: Self-pay | Admitting: Physical Medicine & Rehabilitation

## 2021-03-11 DIAGNOSIS — E782 Mixed hyperlipidemia: Secondary | ICD-10-CM | POA: Diagnosis present

## 2021-03-11 DIAGNOSIS — E785 Hyperlipidemia, unspecified: Secondary | ICD-10-CM | POA: Diagnosis not present

## 2021-03-11 DIAGNOSIS — R2689 Other abnormalities of gait and mobility: Secondary | ICD-10-CM | POA: Diagnosis present

## 2021-03-11 DIAGNOSIS — F17210 Nicotine dependence, cigarettes, uncomplicated: Secondary | ICD-10-CM | POA: Diagnosis present

## 2021-03-11 DIAGNOSIS — I420 Dilated cardiomyopathy: Secondary | ICD-10-CM | POA: Diagnosis not present

## 2021-03-11 DIAGNOSIS — F419 Anxiety disorder, unspecified: Secondary | ICD-10-CM | POA: Diagnosis present

## 2021-03-11 DIAGNOSIS — Z880 Allergy status to penicillin: Secondary | ICD-10-CM | POA: Diagnosis not present

## 2021-03-11 DIAGNOSIS — E871 Hypo-osmolality and hyponatremia: Secondary | ICD-10-CM

## 2021-03-11 DIAGNOSIS — Z23 Encounter for immunization: Secondary | ICD-10-CM

## 2021-03-11 DIAGNOSIS — G9340 Encephalopathy, unspecified: Secondary | ICD-10-CM

## 2021-03-11 DIAGNOSIS — Z79899 Other long term (current) drug therapy: Secondary | ICD-10-CM | POA: Diagnosis not present

## 2021-03-11 DIAGNOSIS — R569 Unspecified convulsions: Secondary | ICD-10-CM | POA: Diagnosis not present

## 2021-03-11 DIAGNOSIS — K5900 Constipation, unspecified: Secondary | ICD-10-CM | POA: Diagnosis present

## 2021-03-11 DIAGNOSIS — I10 Essential (primary) hypertension: Secondary | ICD-10-CM | POA: Diagnosis not present

## 2021-03-11 DIAGNOSIS — Z8249 Family history of ischemic heart disease and other diseases of the circulatory system: Secondary | ICD-10-CM | POA: Diagnosis not present

## 2021-03-11 DIAGNOSIS — Z833 Family history of diabetes mellitus: Secondary | ICD-10-CM | POA: Diagnosis not present

## 2021-03-11 DIAGNOSIS — E1165 Type 2 diabetes mellitus with hyperglycemia: Secondary | ICD-10-CM | POA: Diagnosis not present

## 2021-03-11 DIAGNOSIS — Z7984 Long term (current) use of oral hypoglycemic drugs: Secondary | ICD-10-CM | POA: Diagnosis not present

## 2021-03-11 DIAGNOSIS — R4701 Aphasia: Secondary | ICD-10-CM | POA: Diagnosis not present

## 2021-03-11 DIAGNOSIS — F17290 Nicotine dependence, other tobacco product, uncomplicated: Secondary | ICD-10-CM | POA: Diagnosis present

## 2021-03-11 DIAGNOSIS — R4182 Altered mental status, unspecified: Secondary | ICD-10-CM | POA: Diagnosis not present

## 2021-03-11 DIAGNOSIS — G40901 Epilepsy, unspecified, not intractable, with status epilepticus: Secondary | ICD-10-CM | POA: Diagnosis present

## 2021-03-11 DIAGNOSIS — M6281 Muscle weakness (generalized): Secondary | ICD-10-CM | POA: Diagnosis present

## 2021-03-11 DIAGNOSIS — G40909 Epilepsy, unspecified, not intractable, without status epilepticus: Secondary | ICD-10-CM | POA: Diagnosis not present

## 2021-03-11 DIAGNOSIS — E119 Type 2 diabetes mellitus without complications: Secondary | ICD-10-CM | POA: Diagnosis present

## 2021-03-11 DIAGNOSIS — I428 Other cardiomyopathies: Secondary | ICD-10-CM | POA: Diagnosis not present

## 2021-03-11 LAB — BASIC METABOLIC PANEL
Anion gap: 8 (ref 5–15)
BUN: 17 mg/dL (ref 6–20)
CO2: 24 mmol/L (ref 22–32)
Calcium: 8.9 mg/dL (ref 8.9–10.3)
Chloride: 99 mmol/L (ref 98–111)
Creatinine, Ser: 1.15 mg/dL (ref 0.61–1.24)
GFR, Estimated: >60 mL/min (ref >60)
Glucose, Bld: 95 mg/dL (ref 70–99)
Potassium: 4.6 mmol/L (ref 3.5–5.1)
Sodium: 131 mmol/L — ABNORMAL LOW (ref 135–145)

## 2021-03-11 LAB — CREATININE, SERUM
Creatinine, Ser: 1.04 mg/dL (ref 0.61–1.24)
GFR, Estimated: >60 mL/min (ref >60)

## 2021-03-11 LAB — GLUCOSE, CAPILLARY
Glucose-Capillary: 110 mg/dL — ABNORMAL HIGH (ref 70–99)
Glucose-Capillary: 114 mg/dL — ABNORMAL HIGH (ref 70–99)
Glucose-Capillary: 132 mg/dL — ABNORMAL HIGH (ref 70–99)
Glucose-Capillary: 138 mg/dL — ABNORMAL HIGH (ref 70–99)

## 2021-03-11 MED ORDER — CLONAZEPAM 1 MG PO TABS: 1.0000 mg | ORAL_TABLET | Freq: Two times a day (BID) | ORAL | 0 refills | Status: DC

## 2021-03-11 MED ORDER — PHENYTOIN 50 MG PO CHEW: 50.0000 mg | CHEWABLE_TABLET | Freq: Every day | ORAL | 0 refills | Status: DC

## 2021-03-11 MED ORDER — PERAMPANEL 2 MG PO TABS
4.0000 mg | ORAL_TABLET | Freq: Every day | ORAL | Status: AC
  Administered 2021-03-11 – 2021-03-15 (×5): 4 mg via ORAL
  Filled 2021-03-11 (×5): qty 2

## 2021-03-11 MED ORDER — METHOCARBAMOL 500 MG PO TABS
500.0000 mg | ORAL_TABLET | Freq: Four times a day (QID) | ORAL | Status: DC | PRN
  Administered 2021-03-16 – 2021-03-23 (×5): 500 mg via ORAL
  Filled 2021-03-11 (×5): qty 1

## 2021-03-11 MED ORDER — PERAMPANEL 2 MG PO TABS: 2.0000 mg | ORAL_TABLET | Freq: Every day | ORAL | 0 refills | Status: DC

## 2021-03-11 MED ORDER — CLONIDINE HCL 0.1 MG PO TABS
0.1000 mg | ORAL_TABLET | Freq: Two times a day (BID) | ORAL | Status: DC
  Administered 2021-03-11 – 2021-03-14 (×6): 0.1 mg via ORAL
  Filled 2021-03-11 (×6): qty 1

## 2021-03-11 MED ORDER — PERAMPANEL 4 MG PO TABS: 4.0000 mg | ORAL_TABLET | Freq: Every day | ORAL | 0 refills | Status: DC

## 2021-03-11 MED ORDER — PHENYTOIN 50 MG PO CHEW
50.0000 mg | CHEWABLE_TABLET | Freq: Three times a day (TID) | ORAL | Status: AC
  Administered 2021-03-11 – 2021-03-15 (×13): 50 mg via ORAL
  Filled 2021-03-11 (×15): qty 1

## 2021-03-11 MED ORDER — HYDROCODONE-ACETAMINOPHEN 5-325 MG PO TABS
1.0000 | ORAL_TABLET | ORAL | Status: DC | PRN
  Administered 2021-03-12: 1 via ORAL
  Filled 2021-03-11: qty 1

## 2021-03-11 MED ORDER — ENOXAPARIN SODIUM 40 MG/0.4ML IJ SOSY
40.0000 mg | PREFILLED_SYRINGE | INTRAMUSCULAR | Status: DC
  Administered 2021-03-11 – 2021-03-23 (×13): 40 mg via SUBCUTANEOUS
  Filled 2021-03-11 (×13): qty 0.4

## 2021-03-11 MED ORDER — MIDAZOLAM HCL 2 MG/2ML IJ SOLN: 2.0000 mg | INTRAMUSCULAR | Status: DC | PRN

## 2021-03-11 MED ORDER — ACETAMINOPHEN 325 MG PO TABS
650.0000 mg | ORAL_TABLET | Freq: Four times a day (QID) | ORAL | Status: DC | PRN
  Administered 2021-03-12: 650 mg via ORAL
  Filled 2021-03-11: qty 2

## 2021-03-11 MED ORDER — LACOSAMIDE 200 MG PO TABS: 200.0000 mg | ORAL_TABLET | Freq: Two times a day (BID) | ORAL | 0 refills | Status: DC

## 2021-03-11 MED ORDER — OXYCODONE HCL 5 MG PO TABS
5.0000 mg | ORAL_TABLET | ORAL | Status: DC | PRN
  Administered 2021-03-20: 5 mg via ORAL
  Administered 2021-03-21 – 2021-03-23 (×3): 10 mg via ORAL
  Filled 2021-03-11 (×3): qty 2
  Filled 2021-03-11: qty 1

## 2021-03-11 MED ORDER — LISINOPRIL 10 MG PO TABS: 10.0000 mg | ORAL_TABLET | Freq: Every day | ORAL | 0 refills | Status: DC

## 2021-03-11 MED ORDER — LACOSAMIDE 50 MG PO TABS
200.0000 mg | ORAL_TABLET | Freq: Two times a day (BID) | ORAL | Status: DC
  Administered 2021-03-11 – 2021-03-24 (×26): 200 mg via ORAL
  Filled 2021-03-11 (×26): qty 4

## 2021-03-11 MED ORDER — SENNA 8.6 MG PO TABS
2.0000 | ORAL_TABLET | Freq: Every day | ORAL | Status: DC
  Administered 2021-03-12 – 2021-03-24 (×13): 17.2 mg via ORAL
  Filled 2021-03-11 (×13): qty 2

## 2021-03-11 MED ORDER — LEVETIRACETAM 750 MG PO TABS
1500.0000 mg | ORAL_TABLET | Freq: Two times a day (BID) | ORAL | Status: DC
  Administered 2021-03-11 – 2021-03-24 (×26): 1500 mg via ORAL
  Filled 2021-03-11 (×26): qty 2

## 2021-03-11 MED ORDER — LISINOPRIL 10 MG PO TABS
10.0000 mg | ORAL_TABLET | Freq: Every day | ORAL | Status: DC
  Administered 2021-03-11: 10 mg via ORAL
  Filled 2021-03-11: qty 1

## 2021-03-11 MED ORDER — AMLODIPINE BESYLATE 10 MG PO TABS
10.0000 mg | ORAL_TABLET | Freq: Every day | ORAL | Status: DC
  Administered 2021-03-12 – 2021-03-24 (×13): 10 mg via ORAL
  Filled 2021-03-11 (×13): qty 1

## 2021-03-11 MED ORDER — CLONAZEPAM 0.5 MG PO TABS
1.0000 mg | ORAL_TABLET | Freq: Two times a day (BID) | ORAL | Status: DC
  Administered 2021-03-11 – 2021-03-24 (×27): 1 mg via ORAL
  Filled 2021-03-11 (×27): qty 2

## 2021-03-11 MED ORDER — PHENYTOIN 50 MG PO CHEW
50.0000 mg | CHEWABLE_TABLET | Freq: Two times a day (BID) | ORAL | Status: AC
  Administered 2021-03-16 – 2021-03-20 (×10): 50 mg via ORAL
  Filled 2021-03-11 (×10): qty 1

## 2021-03-11 MED ORDER — PHENYTOIN 50 MG PO CHEW
50.0000 mg | CHEWABLE_TABLET | Freq: Two times a day (BID) | ORAL | 0 refills | Status: DC
Start: 2021-03-16 — End: 2021-03-24

## 2021-03-11 MED ORDER — ASPIRIN EC 81 MG PO TBEC
81.0000 mg | DELAYED_RELEASE_TABLET | Freq: Every day | ORAL | Status: DC
  Administered 2021-03-12 – 2021-03-24 (×13): 81 mg via ORAL
  Filled 2021-03-11 (×13): qty 1

## 2021-03-11 MED ORDER — PHENYTOIN 50 MG PO CHEW: 50.0000 mg | CHEWABLE_TABLET | Freq: Three times a day (TID) | ORAL | 0 refills | Status: DC

## 2021-03-11 MED ORDER — PERAMPANEL 2 MG PO TABS
2.0000 mg | ORAL_TABLET | Freq: Every day | ORAL | Status: DC
  Administered 2021-03-16 – 2021-03-23 (×8): 2 mg via ORAL
  Filled 2021-03-11 (×8): qty 1

## 2021-03-11 MED ORDER — PHENYTOIN 50 MG PO CHEW
50.0000 mg | CHEWABLE_TABLET | Freq: Every day | ORAL | Status: DC
  Administered 2021-03-21 – 2021-03-24 (×4): 50 mg via ORAL
  Filled 2021-03-11 (×4): qty 1

## 2021-03-11 MED ORDER — ACETAMINOPHEN 650 MG RE SUPP: 650.0000 mg | Freq: Four times a day (QID) | RECTAL | Status: DC | PRN

## 2021-03-11 MED ORDER — SENNA 8.6 MG PO TABS: 2.0000 | ORAL_TABLET | Freq: Every day | ORAL | 0 refills | Status: DC

## 2021-03-11 MED ORDER — LISINOPRIL 10 MG PO TABS
10.0000 mg | ORAL_TABLET | Freq: Every day | ORAL | Status: DC
  Administered 2021-03-12 – 2021-03-16 (×5): 10 mg via ORAL
  Filled 2021-03-11 (×5): qty 1

## 2021-03-11 MED ORDER — LEVETIRACETAM 750 MG PO TABS: 1500.0000 mg | ORAL_TABLET | Freq: Two times a day (BID) | ORAL | 0 refills | Status: DC

## 2021-03-11 NOTE — Progress Notes (Signed)
Report called to [facility] on 4W; patient ready for transport to Rehab [phone].

## 2021-03-11 NOTE — Evaluation (Addendum)
Occupational Therapy Assessment and Plan  Patient Details  Name: Arthur [name] MRN: [id number] Date of Birth: [birth year]  OT Diagnosis: abnormal posture, cognitive deficits, and muscle weakness (generalized) Rehab Potential: Rehab Potential (ACUTE ONLY): Good ELOS: 7-10 days   Today's Date: 03/12/2021 OT Individual Time: 3500-9381 OT Individual Time Calculation (min): 73 min     Hospital Problem: Principal Problem:   Encephalopathy acute Active Problems:   Controlled type 2 diabetes mellitus with hyperglycemia, without long-term current use of insulin (HCC)   Past Medical History:  Past Medical History:  Diagnosis Date   DM2 (diabetes mellitus, type 2) ([place])    ETOH abuse    Hypertension    Seizures ([place])    Past Surgical History:  Past Surgical History:  Procedure Laterality Date   HEMORRHOID SURGERY      Assessment & Plan Clinical Impression: 60 year old male with past medical history of diabetes mellitus type 2, EtOH abuse, hypertension, seizures presented to hospital on 02/24/2021 with seizures.  Patient was at home and presented to the ED with anxiety.  History taken from chart review due to cognition.  Patient's father recently passed away with resulting increase in stress as well as depression.  Patient was noted to have twitching of 1 side of his face, however no other associated symptoms and thought to be stress related by patient and family.  Upon presentation to the ED patient was noted to have tonic-clonic activity of bilateral upper extremities without AMS or loss of consciousness.  He received loperamide with resolution of symptoms.  He later had right facial twitching with AMS and decreasing responsiveness.  He was given IV Ativan with resolution of twitching.  There was no associated bowel/bladder incontinence or tongue biting.  Labs were ordered showing creatinine of 1.20 (around patient's baseline) EKG was unchanged.  Head CT unremarkable for acute intracranial  process.  UDS positive for benzodiazepines.  Neurology was consulted and EEG ordered.  EEG showing left parietal slowing consistent with postictal state in the absence of overt evidence of active seizures.  Long-term EEG x3 days was event free.  He was given loading dose of Keppra and Vimpat.  Patient with resulting functional deficits with mobility, self-care, cognition.  Please see preadmission assessment from earlier today as well.    Patient currently requires min with basic self-care skills secondary to muscle weakness, decreased cardiorespiratoy endurance, decreased initiation, decreased attention, decreased awareness, decreased problem solving, decreased safety awareness, and decreased memory, and decreased sitting balance, decreased standing balance, decreased postural control, and decreased balance strategies.  Prior to hospitalization, patient could complete BADLs with independent  per chart.  Patient will benefit from skilled intervention to increase independence with basic self-care skills prior to discharge  home with family .  Anticipate patient will require 24 hour supervision and follow up home health.  OT - End of Session Endurance Deficit: Yes Endurance Deficit Description: Pt returned to bed at close of session and fell asleep before OT left the room OT Assessment Rehab Potential (ACUTE ONLY): Good OT Barriers to Discharge: Behavior OT Barriers to Discharge Comments: pts impulsivity and impaired awareness of deficits OT Patient demonstrates impairments in the following area(s): Balance;Behavior;Cognition;Vision;Endurance;Motor;Safety;Perception OT Basic ADL's Functional Problem(s): Grooming;Bathing;Dressing;Toileting OT Advanced ADL's Functional Problem(s): Simple Meal Preparation OT Transfers Functional Problem(s): Toilet;Tub/Shower OT Additional Impairment(s): None OT Plan OT Intensity: Minimum of 1-2 x/day, 45 to 90 minutes OT Frequency: 5 out of 7 days OT  Duration/Estimated Length of Stay: 7-10 days OT Treatment/Interventions: Balance/vestibular training;Disease mangement/prevention;Neuromuscular  re-education;Self Care/advanced ADL retraining;Therapeutic Exercise;Wheelchair propulsion/positioning;UE/LE Strength taining/ROM;Pain management;DME/adaptive equipment instruction;Cognitive remediation/compensation;Community reintegration;Patient/family education;UE/LE Coordination activities;Visual/perceptual remediation/compensation;Therapeutic Activities;Psychosocial support;Functional mobility training;Discharge planning OT Self Feeding Anticipated Outcome(s): No goal OT Basic Self-Care Anticipated Outcome(s): Supervision OT Toileting Anticipated Outcome(s): Supervision OT Bathroom Transfers Anticipated Outcome(s): Supervision OT Recommendation Recommendations for Other Services: Speech consult Patient destination: Home Follow Up Recommendations: Home health OT Equipment Recommended: To be determined   OT Evaluation Precautions/Restrictions  Precautions Precautions: Fall Precaution Comments: profound cognitive deficits and impaired communication Home Living/Prior Functioning Home Living Family/patient expects to be discharged to:: Private residence Living Arrangements: Spouse/significant other Available Help at Discharge: Family Type of Home: House Home Access: Stairs to enter Technical brewer of Steps: 2 Home Layout: One level Bathroom Shower/Tub: Multimedia programmer: Handicapped height Bathroom Accessibility: Yes Additional Comments: Home environment information obtained from admission notes due to pts cognitive + communication deficits  Lives With: Spouse IADL History Homemaking Responsibilities:  (unknown) Type of Occupation: unknown Leisure and Hobbies: unknown Prior Function Level of Independence: Independent with basic ADLs (per chart) Driving:  (unknown) Vision Baseline Vision/History: 0 No visual deficits  (per pt, question accuracy) Ability to See in Adequate Light: 2 Moderately impaired (unable to read wall clock or pt information from the board in his room, ?visual deficit vs cognitive vs visual + cognitive deficits combined) Patient Visual Report: No change from baseline Vision Assessment?: Vision impaired- to be further tested in functional context Perception  Perception: Within Functional Limits Praxis Praxis: Impaired Praxis Impairment Details: Perseveration;Motor planning;Initiation Cognition Overall Cognitive Status: No family/caregiver present to determine baseline cognitive functioning Arousal/Alertness: Awake/alert Orientation Level: Person;Place (pt able to state his name and that he was at a hospital, when asked for the name of the hospital pt responded "[facility]") Person: Oriented Place: Oriented Situation: Disoriented Year: Other (Comment) ("blee") Month: January (After OT asked the month, prompting him with "January, February, March...") Day of Week: Incorrect Memory: Impaired Immediate Memory Recall:  (0/3 recall, pt repeating "three" or "blee") Memory Recall Sock: Not able to recall Memory Recall Blue: Not able to recall Memory Recall Bed: Not able to recall Awareness: Impaired Awareness Impairment: Emergent impairment Problem Solving: Impaired Behaviors: Impulsive;Perseveration Safety/Judgment: Impaired Comments: Impaired safety awareness and insight into deficits Sensation Sensation Light Touch: Not tested Coordination Gross Motor Movements are Fluid and Coordinated: No Fine Motor Movements are Fluid and Coordinated: Yes Coordination and Movement Description: Pt with very slow gross motor movements during functional ambulation, tends to furniture walk, posterior bias in sitting + standing during functional activity Finger Nose Finger Test: WNL bilaterally, required a significant amount of time and hand over hand demonstration in order for pt to perform the test  correctly Motor  Motor Motor: Other (comment) Motor - Skilled Clinical Observations: Slow and generally uncoordinated due to debility  Trunk/Postural Assessment  Cervical Assessment Cervical Assessment: Within Functional Limits Thoracic Assessment Thoracic Assessment: Within Functional Limits Lumbar Assessment Lumbar Assessment: Exceptions to [facility] (posterior pelvic tilt) Postural Control Postural Control: Deficits on evaluation (posterior bias in sitting + standing)  Balance Balance Balance Assessed: Yes Dynamic Sitting Balance Dynamic Sitting - Balance Support: During functional activity Dynamic Sitting - Level of Assistance: 4: Min assist (drying feet while sitting at the edge of the TTB post shower) Dynamic Standing Balance Dynamic Standing - Level of Assistance: 4: Min assist Dynamic Standing - Balance Activities: Lateral lean/weight shifting;Forward lean/weight shifting (shower transfer without AD) Extremity/Trunk Assessment RUE Assessment RUE Assessment: Within Functional Limits Active Range of Motion (AROM) Comments: WFL,  mild shoulder restrictions in overhead ranges but did not limit him functionally RUE strength: 46# via dynamometer testing LUE Assessment LUE Assessment: Within Functional Limits (?dislocation of metacarpal bone) Active Range of Motion (AROM) Comments: WFL, mild shoulder restrictions in overhead ranges but did not limit him functionally LUE strength: 44# via dynamometer testing  Care Tool Care Tool Self Care Eating    Not assessed    Oral Care    Oral Care Assist Level: Minimal Assistance - Patient > 75% (standing)    Bathing   Body parts bathed by patient: Right arm;Left arm;Chest;Abdomen;Front perineal area;Buttocks;Right upper leg;Left upper leg;Right lower leg;Left lower leg;Face     Assist Level: Minimal Assistance - Patient > 75%    Upper Body Dressing(including orthotics)   What is the patient wearing?: Pull over shirt   Assist Level:  Supervision/Verbal cueing    Lower Body Dressing (excluding footwear)   What is the patient wearing?: Pants Assist for lower body dressing: Contact Guard/Touching assist    Putting on/Taking off footwear   What is the patient wearing?: Non-skid slipper socks Assist for footwear: Contact Guard/Touching assist       Care Tool Toileting Toileting activity   Assist for toileting: Minimal Assistance - Patient > 75%        Toilet transfer   Assist Level: Minimal Assistance - Patient > 75%     Care Tool Cognition  Expression of Ideas and Wants Expression of Ideas and Wants: 3. Some difficulty - exhibits some difficulty with expressing needs and ideas (e.g, some words or finishing thoughts) or speech is not clear  Understanding Verbal and Non-Verbal Content Understanding Verbal and Non-Verbal Content: 2. Sometimes understands - understands only basic conversations or simple, direct phrases. Frequently requires cues to understand   Memory/Recall Ability Memory/Recall Ability : That he or she is in a hospital/hospital unit   Refer to Care Plan for Long Term Goals  SHORT TERM GOAL WEEK 1 OT Short Term Goal 1 (Week 1): STGs=LTGs due to ELOS  Recommendations for other services: Other: SLP    Skilled Therapeutic Intervention Skilled OT session completed with focus on initial evaluation, education on OT role/POC, and establishment of patient-centered goals.   Pt greeted in bed with no c/o pain, agreeable to shower and attempting to get OOB himself despite multiple cues to wait for therapist to set things up first. He set off the bed alarm. Pt completed toileting (using standard toilet, B+B void), bathing (sit<stand in shower, standing 75% of the time), dressing (sit<stand from chair in bathroom), oral care/grooming tasks (standing at sink) during session. All functional transfers completed at ambulatory level without AD given CGA-Min A. Note that pt has a posterior bias in sitting and  standing with decreased awareness. Pt oriented to self and location only, had trouble following 1 step instruction (needed it to be repeated at least twice) and general communication ?receptive + expressive aphasia with recommendation for SLP consult. Pt with nonsensical speech and made up words when conversing with OT. When asked about PLOF, pt stated "I don't know" and "I think he did." When asked who "he" was in our conversation, pt responded "the mayor." Pt with profound cognitive deficits, impaired safety awareness, and  impaired insight into deficits. Noted increase in bilateral knee flexion with prolonged standing, especially when completing oral care. Pt with perseverative tendencies throughout session and required increased time to complete all tasks. He returned to bed at close of session, all needs within reach and bed alarm  set, 4 bedrails up for seizure precautions.   ADL ADL Eating: Not assessed Grooming: Minimal assistance Where Assessed-Grooming: Standing at sink Upper Body Bathing: Minimal assistance Where Assessed-Upper Body Bathing: Shower Lower Body Bathing: Minimal assistance Where Assessed-Lower Body Bathing: Shower Upper Body Dressing: Supervision/safety Where Assessed-Upper Body Dressing: Chair Lower Body Dressing: Contact guard Where Assessed-Lower Body Dressing: Chair Toileting: Minimal assistance Where Assessed-Toileting: Glass blower/designer: Psychiatric nurse Method: Ambulating (no AD) Science writer: Energy manager: Environmental education officer Method: Ambulating (without AD) Youth worker: Transfer tub bench;Grab bars   Discharge Criteria: Patient will be discharged from OT if patient refuses treatment 3 consecutive times without medical reason, if treatment goals not met, if there is a change in medical status, if patient makes no progress towards goals or if patient is discharged from  hospital.  The above assessment, treatment plan, treatment alternatives and goals were discussed and mutually agreed upon: No family available/patient unable  Skeet Simmer 03/12/2021, 12:37 PM

## 2021-03-11 NOTE — H&P (Signed)
Physical Medicine and Rehabilitation Admission H&P    Chief Complaint  Patient presents with   Anxiety  : HPI: 60 year old male with past medical history of diabetes mellitus type 2, EtOH abuse, hypertension, seizures presented to hospital on 02/24/2021 with seizures.  Patient was at home and presented to the ED with anxiety.  History taken from chart review due to cognition.  Patient's father recently passed away with resulting increase in stress as well as depression.  Patient was noted to have twitching of 1 side of his face, however no other associated symptoms and thought to be stress related by patient and family.  Upon presentation to the ED patient was noted to have tonic-clonic activity of bilateral upper extremities without AMS or loss of consciousness.  He received loperamide with resolution of symptoms.  He later had right facial twitching with AMS and decreasing responsiveness.  He was given IV Ativan with resolution of twitching.  There was no associated bowel/bladder incontinence or tongue biting.  Labs were ordered showing creatinine of 1.20 (around patient's baseline) EKG was unchanged.  Head CT unremarkable for acute intracranial process.  UDS positive for benzodiazepines.  Neurology was consulted and EEG ordered.  EEG showing left parietal slowing consistent with postictal state in the absence of overt evidence of active seizures.  Long-term EEG x3 days was event free.  He was given loading dose of Keppra and Vimpat.  Patient with resulting functional deficits with mobility, self-care, cognition.  Please see preadmission assessment from earlier today as well.  Review of Systems  Unable to perform ROS: Mental acuity  Past Medical History:  Diagnosis Date   DM2 (diabetes mellitus, type 2) (HCC)    ETOH abuse    Hypertension    Seizures (HCC)    Past Surgical History:  Procedure Laterality Date   HEMORRHOID SURGERY     Family History  Problem Relation Age of Onset    Diabetes Mother    Hypertension Mother    Diabetes Father    Hypertension Father    Hypertension Sister    Social History:  reports that he has been smoking cigars. He has never used smokeless tobacco. He reports that he does not currently use alcohol. He reports that he does not use drugs. Allergies:  Allergies  Allergen Reactions   Penicillins Hives    Did it involve swelling of the face/tongue/throat, SOB, or low BP? Y Did it involve sudden or severe rash/hives, skin peeling, or any reaction on the inside of your mouth or nose? N Did you need to seek medical attention at a hospital or doctor's office? Y When did it last happen?  Over 5 Years Ago     If all above answers are "NO", may proceed with cephalosporin use.    Medications Prior to Admission  Medication Sig Dispense Refill   cloNIDine (CATAPRES) 0.1 MG tablet Take 1 tablet (0.1 mg total) by mouth 2 (two) times daily. 60 tablet 0   lacosamide (VIMPAT) 50 MG TABS tablet Take 1 tablet (50 mg total) by mouth 2 (two) times daily. 60 tablet 11   levETIRAcetam (KEPPRA) 500 MG tablet Take 1 tablet (500 mg total) by mouth 2 (two) times daily. 60 tablet 12   metFORMIN (GLUCOPHAGE) 500 MG tablet Take 1 tablet (500 mg total) by mouth 2 (two) times daily with a meal. IM PROGRAM 60 tablet 0   amLODipine (NORVASC) 10 MG tablet Take 1 tablet (10 mg total) by mouth daily. (Patient not taking:  Reported on 02/24/2021) 30 tablet 0   aspirin EC 81 MG tablet Take 81 mg by mouth daily. Swallow whole. (Patient not taking: No sig reported)     Multiple Vitamin (MULTIVITAMIN WITH MINERALS) TABS tablet Take 1 tablet by mouth daily. (Patient not taking: No sig reported)      Drug Regimen Review Drug regimen was reviewed and remains appropriate with no significant issues identified  Home: Home Living Family/patient expects to be discharged to:: Private residence Living Arrangements: Spouse/significant other Available Help at Discharge: Family Type  of Home: House Home Access: Stairs to enter Secretary/administrator of Steps: 2 Home Layout: One level Bathroom Shower/Tub: Health visitor: Handicapped height Bathroom Accessibility: Yes Home Equipment: Environmental consultant - 2 wheels Additional Comments: information collected from admittance 09/2020   Functional History: Prior Function Level of Independence: Independent Comments: pt unable to provide info  Functional Status:  Mobility: Bed Mobility Overal bed mobility: Needs Assistance Bed Mobility: Supine to Sit Rolling: Min assist Supine to sit: Min guard, HOB elevated Sit to supine: Min guard General bed mobility comments: increased time, decreased initiation, cues for sequencing Transfers Overall transfer level: Needs assistance Equipment used: Ambulation equipment used Transfers: Sit to/from Stand Sit to Stand: Min guard, Min assist Stand pivot transfers: Max assist General transfer comment: LOB posteriorly with initial stance requiring min assist to correct Ambulation/Gait Ambulation/Gait assistance: Min assist Gait Distance (Feet): 75 Feet Assistive device: 1 person hand held assist Gait Pattern/deviations: Step-through pattern, Decreased stride length General Gait Details: decreased step height bilat. Slow, guarded gait. HHA on L Gait velocity: decreased Gait velocity interpretation: <1.8 ft/sec, indicate of risk for recurrent falls    ADL: ADL Overall ADL's : Needs assistance/impaired Eating/Feeding: Maximal assistance, Sitting, Bed level Grooming: Wash/dry hands, Wash/dry face, Moderate assistance, Sitting Upper Body Bathing: Total assistance, Sitting Lower Body Bathing: Total assistance, Sit to/from stand Upper Body Dressing : Maximal assistance, Sitting Upper Body Dressing Details (indicate cue type and reason): Pt did assist with threading arms through sleeves Lower Body Dressing: Minimal assistance, Bed level Lower Body Dressing Details (indicate cue  type and reason): pt able to don socks in long sitting while sitting in the bed with simple verbal cues and min A for assistance wtih L sock Toilet Transfer: Moderate assistance, +2 for physical assistance, +2 for safety/equipment Toilet Transfer Details (indicate cue type and reason): simulated - short 48ft ambulation with +2 HH assist Toileting- Clothing Manipulation and Hygiene: Total assistance Toileting - Clothing Manipulation Details (indicate cue type and reason): Pt incontinent of urine and was assisted with clean up Functional mobility during ADLs: Moderate assistance, +2 for physical assistance, +2 for safety/equipment General ADL Comments: pt limited by confusion, L sided weakness and continues to required +2 assist for all OOB tasks  Cognition: Cognition Overall Cognitive Status: Impaired/Different from baseline Orientation Level: Oriented to person Cognition Arousal/Alertness: Awake/alert Behavior During Therapy: WFL for tasks assessed/performed (pleasant and cooperative) Overall Cognitive Status: Impaired/Different from baseline Area of Impairment: Orientation, Memory, Safety/judgement, Awareness, Problem solving, Attention, Following commands Orientation Level: Disoriented to, Place, Time, Situation Current Attention Level: Sustained Memory: Decreased short-term memory Following Commands: Follows one step commands consistently Safety/Judgement: Decreased awareness of deficits, Decreased awareness of safety Awareness: Emergent Problem Solving: Difficulty sequencing, Requires verbal cues General Comments: mildly perseverative, verbal cues for sequencing Difficult to assess due to: Impaired communication  Physical Exam: Blood pressure 121/83, pulse 79, temperature 98.2 F (36.8 C), temperature source Oral, resp. rate 19, height 5\' 3"  (1.6 m),  weight 66.4 kg, SpO2 100 %. Physical Exam Vitals reviewed.  Constitutional:      General: He is not in acute distress.     Appearance: Normal appearance.  HENT:     Head: Normocephalic and atraumatic.     Right Ear: External ear normal.     Left Ear: External ear normal.     Nose: Nose normal.  Eyes:     General:        Right eye: No discharge.        Left eye: No discharge.     Extraocular Movements: Extraocular movements intact.  Cardiovascular:     Rate and Rhythm: Normal rate and regular rhythm.  Pulmonary:     Effort: Pulmonary effort is normal. No respiratory distress.     Breath sounds: No stridor.  Abdominal:     General: Abdomen is flat. Bowel sounds are normal. There is no distension.  Musculoskeletal:     Cervical back: Normal range of motion and neck supple.     Comments: No edema or tenderness in extremities  Skin:    General: Skin is warm and dry.  Neurological:     Mental Status: He is alert.     Comments: Alert and oriented x1 Motor: 4/5 throughout  Psychiatric:        Mood and Affect: Affect is flat.        Speech: Speech is delayed.        Behavior: Behavior is slowed.        Cognition and Memory: Cognition is impaired.    Results for orders placed or performed during the hospital encounter of 02/24/21 (from the past 48 hour(s))  Glucose, capillary     Status: Abnormal   Collection Time: 03/09/21  4:35 PM  Result Value Ref Range   Glucose-Capillary 127 (H) 70 - 99 mg/dL    Comment: Glucose reference range applies only to samples taken after fasting for at least 8 hours.  Glucose, capillary     Status: Abnormal   Collection Time: 03/09/21 10:15 PM  Result Value Ref Range   Glucose-Capillary 132 (H) 70 - 99 mg/dL    Comment: Glucose reference range applies only to samples taken after fasting for at least 8 hours.  Glucose, capillary     Status: Abnormal   Collection Time: 03/10/21  6:17 AM  Result Value Ref Range   Glucose-Capillary 119 (H) 70 - 99 mg/dL    Comment: Glucose reference range applies only to samples taken after fasting for at least 8 hours.  Basic metabolic  panel     Status: Abnormal   Collection Time: 03/10/21  7:24 AM  Result Value Ref Range   Sodium 130 (L) 135 - 145 mmol/L   Potassium 4.3 3.5 - 5.1 mmol/L   Chloride 99 98 - 111 mmol/L   CO2 22 22 - 32 mmol/L   Glucose, Bld 114 (H) 70 - 99 mg/dL    Comment: Glucose reference range applies only to samples taken after fasting for at least 8 hours.   BUN 23 (H) 6 - 20 mg/dL   Creatinine, Ser 3.87 (H) 0.61 - 1.24 mg/dL   Calcium 8.9 8.9 - 56.4 mg/dL   GFR, Estimated >33 >29 mL/min    Comment: (NOTE) Calculated using the CKD-EPI Creatinine Equation (2021)    Anion gap 9 5 - 15    Comment: Performed at [facility], [address], [place], [place] [address]  Glucose, capillary  Status: Abnormal   Collection Time: 03/10/21 12:04 PM  Result Value Ref Range   Glucose-Capillary 121 (H) 70 - 99 mg/dL    Comment: Glucose reference range applies only to samples taken after fasting for at least 8 hours.  Glucose, capillary     Status: Abnormal   Collection Time: 03/10/21  4:01 PM  Result Value Ref Range   Glucose-Capillary 130 (H) 70 - 99 mg/dL    Comment: Glucose reference range applies only to samples taken after fasting for at least 8 hours.  Glucose, capillary     Status: Abnormal   Collection Time: 03/10/21  9:51 PM  Result Value Ref Range   Glucose-Capillary 100 (H) 70 - 99 mg/dL    Comment: Glucose reference range applies only to samples taken after fasting for at least 8 hours.  Basic metabolic panel     Status: Abnormal   Collection Time: 03/11/21  4:56 AM  Result Value Ref Range   Sodium 131 (L) 135 - 145 mmol/L   Potassium 4.6 3.5 - 5.1 mmol/L   Chloride 99 98 - 111 mmol/L   CO2 24 22 - 32 mmol/L   Glucose, Bld 95 70 - 99 mg/dL    Comment: Glucose reference range applies only to samples taken after fasting for at least 8 hours.   BUN 17 6 - 20 mg/dL   Creatinine, Ser 1.51 0.61 - 1.24 mg/dL   Calcium 8.9 8.9 - 76.1 mg/dL   GFR, Estimated >60 >73 mL/min    Comment:  (NOTE) Calculated using the CKD-EPI Creatinine Equation (2021)    Anion gap 8 5 - 15    Comment: Performed at [facility], [address], [place], [place] [address]  Glucose, capillary     Status: Abnormal   Collection Time: 03/11/21  6:30 AM  Result Value Ref Range   Glucose-Capillary 110 (H) 70 - 99 mg/dL    Comment: Glucose reference range applies only to samples taken after fasting for at least 8 hours.  Glucose, capillary     Status: Abnormal   Collection Time: 03/11/21 11:32 AM  Result Value Ref Range   Glucose-Capillary 114 (H) 70 - 99 mg/dL    Comment: Glucose reference range applies only to samples taken after fasting for at least 8 hours.   Comment 1 Notify RN    Comment 2 Document in Chart    No results found.     Medical Problem List and Plan: 1.  Deficits in mobility, self-care, cognition secondary to acute encephalopathy.             -patient may shower             -ELOS/Goals: 6-9 days/supervision  Admit to CIR 2.  DVT prophylaxis/anticoagulation:   -Mechanical: Sequential compression devices, below knee Bilateral lower extremities Pharmaceutical: Lovenox:              -antiplatelet therapy: ASA 81 3. Pain Management: As needed meds  Team support 4.  Mood: Klonopin 1 mg twice daily for anxiety             -antipsychotic agents:N/A 5. Neuropsych: This patient is not capable of making decisions on his own behalf. 6. Skin/Wound Care: Routine skin care 7. Fluids/Electrolytes/Nutrition: Routine I/Os  CMP ordered 8.  Essential hypertension  Norvasc 10, clonidine 0.1 mg twice daily, Lisinopril 10 mg daily Monitor with increased mobility 9.  Seizures Perampanel 4 mg x 5 days, then 2 mg x 10 days Phenytoin  50 mg 3 times daily x5 days, then 50 mg twice daily x5 days, then 50 mg daily x5 days Vimpat 200 mg twice daily  Keppra 1500 mg twice daily Monitor for breakthrough seizures 10.  Diabetes mellitus type 2  Metformin 500 mg twice daily PTA  Monitor  with increased mobility 11.  Hyponatremia  Sodium 131 on 10/15  CMP ordered  03/11/2021

## 2021-03-11 NOTE — Progress Notes (Signed)
Notified his spouse [name] that he moved to [phone].

## 2021-03-11 NOTE — Progress Notes (Signed)
PMR Admission Coordinator Pre-Admission Assessment  Patient: Arthur [name] is an 60 y.o., male MRN: [id number] DOB: 04/17/1961 Height: 5' 3" (160 cm) Weight: 71.1 kg  Insurance Information HMO:     PPO:      PCP:      IPA:      80/20:      OTHER:  PRIMARY: [facility]       Policy#: [id number]    COE: MADNN  Subscriber: Pt.  CM Name:      Phone#:      Fax#:  Pre-Cert#:       Employer:  Benefits:  Phone #:      Name:  Eff. Date: effective 03/08/21     Deduct: $0      Out of Pocket Max: $0      Life Max: n/a CIR: $0      SNF: $0 Outpatient: $0     Co-Pay: $0 Home Health: $0      Co-Pay: $0  DME: $0     Co-Pay: $0  Providers:  in network  SECONDARY:       Policy#:      Phone#:   Financial Counselor:       Phone#:   The "Data Collection Information Summary" for patients in Inpatient Rehabilitation Facilities with attached "Privacy Act Statement-Health Care Records" was provided and verbally reviewed with: Family  Emergency Contact Information Contact Information     Name Relation Home Work Mobile   Arthur [name] Spouse [phone]         Current Medical History  Patient Admitting Diagnosis: Seizure, Acute Metabolic Encephalopathy History of Present Illness: Arthur [name] is a 60 y.o. male with medical history significant for seizures, essential hypertension, type 2 diabetes mellitus, who is admitted to [facility] on 02/24/2021 with seizure after presenting from home to [facility] ED complaining of anxiety. EEG show evidence of epileptogenicity and cortical dysfunction arising from left posterior quadrant. MRI 03/03/21 shows Increased T2 signal, decreased volume and blurring of internal structures of the left hippocampus suggesting mesial temporal sclerosis. 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 LTM EEG done for 3 days until event free, followed by  brain MRI, which does not show any acute changes that could explain the ongoing expressive aphasia. Ongoing expressive aphasia likely combination of sedative agents and post ictal state, and can take weeks to months to improve and determine new post status epilepticus baseline. Pt remained seizure free for 2-3 days, so neurology started phenytoin and parampanel taper. Pt. With persistent encephalopathy, mobility deficit,  and expressive aphasia. CIR consulted to assist return to PLOF.     Patient's medical record from [facility] has been reviewed by the rehabilitation admission coordinator and physician.  Past Medical History  Past Medical History:  Diagnosis Date   DM2 (diabetes mellitus, type 2) (HCC)    ETOH abuse    Hypertension    Seizures (HCC)     Has the patient had major surgery during 100 days prior to admission? No  Family History   family history includes Diabetes in his father and mother; Hypertension in his father, mother, and sister.  Current Medications  Current Facility-Administered Medications:    acetaminophen (TYLENOL) tablet 650 mg, 650 mg, Oral, Q6H PRN, 650 mg at 03/02/21 0953 **OR** acetaminophen (TYLENOL) suppository 650 mg, 650 mg, Rectal, Q6H PRN, Howerter, Justin B, DO     amLODipine (NORVASC) tablet 10 mg, 10 mg, Oral, Daily, Howerter, Justin B, DO, 10 mg at 03/07/21 0842   clonazePAM (KLONOPIN) tablet 1 mg, 1 mg, Oral, BID, Kirkpatrick, McNeill P, MD, 1 mg at 03/07/21 2141   cloNIDine (CATAPRES) tablet 0.1 mg, 0.1 mg, Oral, BID, Liang, Jessica, MD, 0.1 mg at 03/07/21 2101   lacosamide (VIMPAT) tablet 200 mg, 200 mg, Oral, BID, Patel, Monik, MD, 200 mg at 03/07/21 2140   levETIRAcetam (KEPPRA) tablet 1,500 mg, 1,500 mg, Oral, BID, Patel, Monik, MD, 1,500 mg at 03/07/21 2141   midazolam (VERSED) injection 2 mg, 2 mg, Intravenous, Q4H PRN, Yadav, Priyanka O, MD   perampanel (FYCOMPA) tablet 4 mg, 4 mg, Oral, QHS, 4 mg at 03/07/21 2141 **FOLLOWED  BY** [START ON 03/16/2021] perampanel (FYCOMPA) tablet 2 mg, 2 mg, Oral, QHS, Yadav, Priyanka O, MD   phenytoin (DILANTIN) chewable tablet 75 mg, 75 mg, Oral, TID, 75 mg at 03/07/21 2140 **FOLLOWED BY** [START ON 03/11/2021] phenytoin (DILANTIN) chewable tablet 50 mg, 50 mg, Oral, TID **FOLLOWED BY** [START ON 03/16/2021] phenytoin (DILANTIN) chewable tablet 50 mg, 50 mg, Oral, BID **FOLLOWED BY** [START ON 03/21/2021] phenytoin (DILANTIN) chewable tablet 50 mg, 50 mg, Oral, Daily, Yadav, Priyanka O, MD   senna (SENOKOT) tablet 17.2 mg, 2 tablet, Oral, Daily, Patel, Monik, MD, 17.2 mg at 03/07/21 0842  Patients Current Diet:  Diet Order             Diet Carb Modified Fluid consistency: Thin; Room service appropriate? Yes  Diet effective now                   Precautions / Restrictions Precautions Precautions: Fall Precaution Comments: high fall risk/impulsivity Restrictions Weight Bearing Restrictions: No   Has the patient had 2 or more falls or a fall with injury in the past year? No  Prior Activity Level Community (5-7x/wk): Pt. was active in the community PTA  Prior Functional Level Self Care: Did the patient need help bathing, dressing, using the toilet or eating? Independent  Indoor Mobility: Did the patient need assistance with walking from room to room (with or without device)? Independent  Stairs: Did the patient need assistance with internal or external stairs (with or without device)? Independent  Functional Cognition: Did the patient need help planning regular tasks such as shopping or remembering to take medications? Needed some help  Patient Information Are you of Hispanic, Latino/a,or Spanish origin?: A. No, not of Hispanic, Latino/a, or Spanish origin What is your race?: B. Black or African American Do you need or want an interpreter to communicate with a doctor or health care staff?: 0. No  Patient's Response To:  Health Literacy and Transportation Is the  patient able to respond to health literacy and transportation needs?: No Health Literacy - How often do you need to have someone help you when you read instructions, pamphlets, or other written material from your doctor or pharmacy?: Patient unable to respond  Home Assistive Devices / Equipment Home Equipment: Walker - 2 wheels  Prior Device Use: Indicate devices/aids used by the patient prior to current illness, exacerbation or injury? None of the above  Current Functional Level Cognition  Overall Cognitive Status: Impaired/Different from baseline Difficult to assess due to: Impaired communication Current Attention Level: Sustained Orientation Level: Oriented to person, Oriented to place Following Commands: Follows one step commands inconsistently, Follows one step commands with increased time General Comments: Pt followed one step motor commands ~70% of the time with a   delay and does better with tactile/gestural cues. Pt frequently making nonsensical responses to simple questions but able to state his favorite football team and answer some simple yes/no questions. Pt unable to state city/location, reason for admission or year/month.    Extremity Assessment (includes Sensation/Coordination)  Upper Extremity Assessment: Generalized weakness  Lower Extremity Assessment: Defer to PT evaluation    ADLs  Overall ADL's : Needs assistance/impaired Eating/Feeding: Maximal assistance, Sitting, Bed level Grooming: Wash/dry hands, Wash/dry face, Moderate assistance, Sitting Upper Body Bathing: Total assistance, Sitting Lower Body Bathing: Total assistance, Sit to/from stand Upper Body Dressing : Maximal assistance, Sitting Upper Body Dressing Details (indicate cue type and reason): Pt did assist with threading arms through sleeves Lower Body Dressing: Total assistance, Bed level Toilet Transfer: Maximal assistance, Stand-pivot, BSC Toileting- Clothing Manipulation and Hygiene: Total  assistance Toileting - Clothing Manipulation Details (indicate cue type and reason): Pt incontinent of urine and was assisted with clean up Functional mobility during ADLs: Maximal assistance    Mobility  Overal bed mobility: Needs Assistance Bed Mobility: Rolling, Supine to Sit, Sit to Supine Rolling: Min assist Supine to sit: Min assist Sit to supine: Max assist General bed mobility comments: pt requires min assist and multimodal cues, increased time.    Transfers  Overall transfer level: Needs assistance Equipment used: Rolling walker (2 wheeled), None Transfers: Sit to/from Stand, Stand Pivot Transfers Sit to Stand: Max assist, Mod assist Stand pivot transfers: Max assist General transfer comment: Pt able to stand from EOB with +1 maxA initially (attempted with RW but pt leans posteriorly and unable to manage properly). Face to face stepping and pivoting to BSC. After sitting ~5 mins, pt stood with RW and maxA to pivot to recliner ~4ft from BSC then x5 STS from recliner<>RW with modA after practice.    Ambulation / Gait / Stairs / Wheelchair Mobility  Ambulation/Gait Ambulation/Gait assistance: Max assist Gait Distance (Feet): 4 Feet Assistive device: Rolling walker (2 wheeled) Gait Pattern/deviations: Step-to pattern, Shuffle, Decreased step length - right, Decreased dorsiflexion - right, Decreased dorsiflexion - left, Leaning posteriorly General Gait Details: max multimodal cues for step sequencing, pt needs max to totalA to manage RW, pt needs some manual assist to advance legs when turning/stepping, needs more assist for RLE than for LLE.    Posture / Balance Dynamic Sitting Balance Sitting balance - Comments: pt needing minA to min guard for safety due to impulsivity/confusion Balance Overall balance assessment: Needs assistance Sitting-balance support: Bilateral upper extremity supported, Feet supported Sitting balance-Leahy Scale: Fair Sitting balance - Comments: pt  needing minA to min guard for safety due to impulsivity/confusion Standing balance support: Bilateral upper extremity supported, During functional activity Standing balance-Leahy Scale: Zero Standing balance comment: requires UE support and heavy external assist, initially maxA for static standing progressed to modA    Special needs/care consideration None   Previous Home Environment (from acute therapy documentation) Living Arrangements: Spouse/significant other Available Help at Discharge: Family Type of Home: House Home Layout: One level Home Access: Stairs to enter Entrance Stairs-Number of Steps: 2 Bathroom Shower/Tub: Walk-in shower Bathroom Toilet: Handicapped height Bathroom Accessibility: Yes How Accessible: Accessible via walker Home Care Services: Yes Additional Comments: information collected from admittance 09/2020  Discharge Living Setting Plans for Discharge Living Setting: Patient's home Type of Home at Discharge: House Discharge Home Layout: One level Discharge Home Access: Stairs to enter Entrance Stairs-Rails: Can reach both Entrance Stairs-Number of Steps: 2 Discharge Bathroom Shower/Tub: Walk-in shower Discharge Bathroom Toilet: Handicapped height   Discharge Bathroom Accessibility: Yes How Accessible: Accessible via walker  Social/Family/Support Systems Patient Roles: Spouse Contact Information: [phone] Anticipated Caregiver: [name] [name] Anticipated Caregiver's Contact Information: [phone] Ability/Limitations of Caregiver: Can provide Mod A Caregiver Availability: 24/7 Discharge Plan Discussed with Primary Caregiver: Yes Is Caregiver In Agreement with Plan?: Yes Does Caregiver/Family have Issues with Lodging/Transportation while Pt is in Rehab?: Yes  Goals Patient/Family Goal for Rehab: PT/OT Mod A, SLP Min A Expected length of stay: 10-12 days Pt/Family Agrees to Admission and willing to participate: Yes Program Orientation Provided &  Reviewed with Pt/Caregiver Including Roles  & Responsibilities: Yes  Decrease burden of Care through IP rehab admission: Specialzed equipment needs, Decrease number of caregivers, Bowel and bladder program, and Patient/family education  Possible need for SNF placement upon discharge: not anticipated   Patient Condition: I have reviewed medical records from [facility], spoken with CM, and patient and spouse. I met with patient at the bedside for inpatient rehabilitation assessment.  Patient will benefit from ongoing PT, OT, and SLP, can actively participate in 3 hours of therapy a day 5 days of the week, and can make measurable gains during the admission.  Patient will also benefit from the coordinated team approach during an Inpatient Acute Rehabilitation admission.  The patient will receive intensive therapy as well as Rehabilitation physician, nursing, social worker, and care management interventions.  Due to safety, skin/wound care, disease management, medication administration, pain management, and patient education the patient requires 24 hour a day rehabilitation nursing.  The patient is currently min A-min guard with mobility and basic ADLs.  Discharge setting and therapy post discharge at home with home health is anticipated.  Patient has agreed to participate in the Acute Inpatient Rehabilitation Program and will admit today.  Preadmission Screen Completed By:  Trystian Crisanto B Nijel Flink, 03/08/2021 9:39 AM ______________________________________________________________________   Discussed status with Dr. Patel  on 03/11/21 at 830 and received approval for admission today.  Admission Coordinator:  Shaymus Eveleth B Trevaris Pennella, CCC-SLP, time 945/Date 03/11/21   Assessment/Plan: Diagnosis: Acute Metabolic Encephalopathy Does the need for close, 24 hr/day Medical supervision in concert with the patient's rehab needs make it unreasonable for this patient to be served in a less intensive setting? Yes Co-Morbidities  requiring supervision/potential complications: seizures, essential hypertension, type 2 diabetes mellitus Due to safety, disease management, and patient education, does the patient require 24 hr/day rehab nursing? Yes Does the patient require coordinated care of a physician, rehab nurse, PT, OT, and SLP to address physical and functional deficits in the context of the above medical diagnosis(es)? Yes Addressing deficits in the following areas: balance, endurance, locomotion, strength, transferring, toileting, cognition, and psychosocial support Can the patient actively participate in an intensive therapy program of at least 3 hrs of therapy 5 days a week? Yes The potential for patient to make measurable gains while on inpatient rehab is excellent and good Anticipated functional outcomes upon discharge from inpatient rehab: supervision PT, supervision OT, supervision SLP Estimated rehab length of stay to reach the above functional goals is: 6-9 days. Anticipated discharge destination: Home 10. Overall Rehab/Functional Prognosis: excellent and good   MD Signature: Ankit Patel, MD, ABPMR   stay to reach the above functional goals is: 6-9 days. Anticipated discharge destination: Home 10. Overall Rehab/Functional Prognosis: excellent and good     MD Signature: Delice Lesch, MD, ABPMR

## 2021-03-11 NOTE — Progress Notes (Signed)
Inpatient Rehabilitation Admission Medication Review by a Pharmacist  A complete drug regimen review was completed for this patient to identify any potential clinically significant medication issues.  High Risk Drug Classes Is patient taking? Indication by Medication  Antipsychotic No   Anticoagulant Yes Enoxaparin - VTE ppx  Antibiotic No   Opioid Yes Oxycodone prn - severe pain Norco prn - moderate pain  Antiplatelet Yes Aspirin - stroke prevention  Hypoglycemics/insulin No   Vasoactive Medication Yes Amlodipine - BP Clonidine - BP  Chemotherapy No   Other Yes Lacosamide - seizure Levetiracetam - seizure Perampanel - seizure Phenytoin - seizure Midazolam prn - seizure Clonazepam - anxiety     Type of Medication Issue Identified Description of Issue Recommendation(s)  Drug Interaction(s) (clinically significant)     Duplicate Therapy     Allergy     No Medication Administration End Date     Incorrect Dose     Additional Drug Therapy Needed  Inpatient sliding scale insulin not ordered   PTA metformin not ordered   PTA multivitamin not ordered Consider ordering if needing CBG control   Order as needed to control blood sugar  Order as needed  Significant med changes from prior encounter (inform family/care partners about these prior to discharge).    Other       Clinically significant medication issues were identified that warrant physician communication and completion of prescribed/recommended actions by midnight of the next day:  No   Time spent performing this drug regimen review (minutes):  25 minutes  Thank you for involving pharmacy in this patient's care.  Arnette Felts, PharmD PGY1 Ambulatory Care Pharmacy Resident 03/11/2021 2:45 PM  **Pharmacist phone directory can be found on [email or web address] listed under [facility]**

## 2021-03-11 NOTE — Progress Notes (Signed)
Inpatient Rehab Admissions Coordinator:   I have a CIR bed for this Pt. And plan to admit today. RN may call report to [phone] after 12pm  Megan Salon, MS, CCC-SLP Rehab Admissions Coordinator  [phone] (celll) [phone] (office)

## 2021-03-11 NOTE — Progress Notes (Signed)
Physical Therapy Treatment Patient Details Name: Arthur [name] MRN: [id number] DOB: Feb 07, 1961 Today's Date: 03/11/2021   History of Present Illness 60 y.o. male admitted 02/24/2021 with seizure after presenting from home to [facility] ED complaining of anxiety had an episode of focal right-sided twitching, right gaze deviation and not following commands. Pt with multiple seizures and onset of AMS during admittance,, EEG show evidence of epileptogenicity and cortical dysfunction arising from left posterior quadrant. MRI 03/03/21 shows Increased T2 signal, decreased volume and blurring of internal structures of the left hippocampus suggesting mesial temporal sclerosis. PMH:  epileptic seizures, essential hypertension, type 2 diabetes mellitus.    PT Comments    Pt received in bed, pleasant and cooperative. Oriented to self only. He required min guard assist bed mobility, min assist transfers, and min/HHA ambulation 75'. Cues required for initiation and sequencing. Pt in recliner with feet elevated at end of session.    Recommendations for follow up therapy are one component of a multi-disciplinary discharge planning process, led by the attending physician.  Recommendations may be updated based on patient status, additional functional criteria and insurance authorization.  Follow Up Recommendations  Supervision/Assistance - 24 hour;CIR;Supervision for mobility/OOB     Equipment Recommendations  None recommended by PT    Recommendations for Other Services       Precautions / Restrictions Precautions Precautions: Fall Precaution Comments: high fall risk/impulsivity     Mobility  Bed Mobility Overal bed mobility: Needs Assistance Bed Mobility: Supine to Sit     Supine to sit: Min guard;HOB elevated     General bed mobility comments: increased time, decreased initiation, cues for sequencing    Transfers Overall transfer level: Needs assistance Equipment used: Ambulation equipment  used Transfers: Sit to/from Stand Sit to Stand: Min guard;Min assist         General transfer comment: LOB posteriorly with initial stance requiring min assist to correct  Ambulation/Gait Ambulation/Gait assistance: Min assist Gait Distance (Feet): 75 Feet Assistive device: 1 person hand held assist Gait Pattern/deviations: Step-through pattern;Decreased stride length Gait velocity: decreased Gait velocity interpretation: <1.8 ft/sec, indicate of risk for recurrent falls General Gait Details: decreased step height bilat. Slow, guarded gait. HHA on L   Stairs             Wheelchair Mobility    Modified Rankin (Stroke Patients Only)       Balance Overall balance assessment: Needs assistance Sitting-balance support: Feet supported;No upper extremity supported Sitting balance-Leahy Scale: Fair     Standing balance support: No upper extremity supported;Single extremity supported;During functional activity Standing balance-Leahy Scale: Fair Standing balance comment: static stand without UE support                            Cognition Arousal/Alertness: Awake/alert Behavior During Therapy: WFL for tasks assessed/performed (pleasant and cooperative) Overall Cognitive Status: Impaired/Different from baseline Area of Impairment: Orientation;Memory;Safety/judgement;Awareness;Problem solving;Attention;Following commands                 Orientation Level: Disoriented to;Place;Time;Situation Current Attention Level: Sustained Memory: Decreased short-term memory Following Commands: Follows one step commands consistently Safety/Judgement: Decreased awareness of deficits;Decreased awareness of safety Awareness: Emergent Problem Solving: Difficulty sequencing;Requires verbal cues General Comments: mildly perseverative, verbal cues for sequencing      Exercises      General Comments        Pertinent Vitals/Pain Pain Assessment: No/denies pain     Home Living  Prior Function            PT Goals (current goals can now be found in the care plan section) Acute Rehab PT Goals Patient Stated Goal: home Progress towards PT goals: Progressing toward goals    Frequency    Min 4X/week      PT Plan Current plan remains appropriate    Co-evaluation              AM-PAC PT "6 Clicks" Mobility   Outcome Measure  Help needed turning from your back to your side while in a flat bed without using bedrails?: A Little Help needed moving from lying on your back to sitting on the side of a flat bed without using bedrails?: A Little Help needed moving to and from a bed to a chair (including a wheelchair)?: A Little Help needed standing up from a chair using your arms (e.g., wheelchair or bedside chair)?: A Little Help needed to walk in hospital room?: A Little Help needed climbing 3-5 steps with a railing? : A Lot 6 Click Score: 17    End of Session Equipment Utilized During Treatment: Gait belt Activity Tolerance: Patient tolerated treatment well Patient left: in chair;with call bell/phone within reach;with chair alarm set Nurse Communication: Mobility status PT Visit Diagnosis: Other abnormalities of gait and mobility (R26.89);Difficulty in walking, not elsewhere classified (R26.2);Other symptoms and signs involving the nervous system (B15.176)     Time: 1607-3710 PT Time Calculation (min) (ACUTE ONLY): 18 min  Charges:  $Gait Training: 8-22 mins                     Arthur [name], PT  Office # [phone] Pager [phone]    Arthur [name] 03/11/2021, 10:00 AM

## 2021-03-11 NOTE — Progress Notes (Signed)
Patient arrived via W/C at approximately 1445. Patient is A&O 1-2. Patient knows his name and birth date. Spoke to patients wife [name] regarding metal status.   Patients wife states he has confusion at home also. Wife also notified of patients room change. Wife has no questions or concerns. Patient skin is CDI, warm to the touch .Lungs clear bilat, no cough or congestion noted. ABD soft, non tender. Positive pedal pulses, no edema noted, cap refill less than 3 seconds. Call light within reach, safety devices in place, bed in low position.

## 2021-03-12 DIAGNOSIS — G9340 Encephalopathy, unspecified: Secondary | ICD-10-CM | POA: Diagnosis not present

## 2021-03-12 DIAGNOSIS — I10 Essential (primary) hypertension: Secondary | ICD-10-CM | POA: Diagnosis not present

## 2021-03-12 DIAGNOSIS — E1165 Type 2 diabetes mellitus with hyperglycemia: Secondary | ICD-10-CM

## 2021-03-12 DIAGNOSIS — R569 Unspecified convulsions: Secondary | ICD-10-CM | POA: Diagnosis not present

## 2021-03-12 DIAGNOSIS — E871 Hypo-osmolality and hyponatremia: Secondary | ICD-10-CM | POA: Diagnosis not present

## 2021-03-12 LAB — GLUCOSE, CAPILLARY
Glucose-Capillary: 100 mg/dL — ABNORMAL HIGH (ref 70–99)
Glucose-Capillary: 85 mg/dL (ref 70–99)
Glucose-Capillary: 124 mg/dL — ABNORMAL HIGH (ref 70–99)

## 2021-03-12 NOTE — Progress Notes (Signed)
Slept good. A & O to self only. "1998" OOB without assistance, bed alarm alerted staff. Moved patient to the middle setting on bed alarm. Noted patient had voided in water bottle, urinal within reach on bed. Denies pain. Alfredo [name] A

## 2021-03-12 NOTE — H&P (Addendum)
Late entry:  Physical Medicine and Rehabilitation Admission H&P    Chief Complaint  Patient presents with   Anxiety  : HPI: 60 year old male with past medical history of diabetes mellitus type 2, EtOH abuse, hypertension, seizures presented to hospital on 02/24/2021 with seizures.  Patient was at home and presented to the ED with anxiety.  History taken from chart review due to cognition.  Patient's father recently passed away with resulting increase in stress as well as depression.  Patient was noted to have twitching of 1 side of his face, however no other associated symptoms and thought to be stress related by patient and family.  Upon presentation to the ED patient was noted to have tonic-clonic activity of bilateral upper extremities without AMS or loss of consciousness.  He received loperamide with resolution of symptoms.  He later had right facial twitching with AMS and decreasing responsiveness.  He was given IV Ativan with resolution of twitching.  There was no associated bowel/bladder incontinence or tongue biting.  Labs were ordered showing creatinine of 1.20 (around patient's baseline) EKG was unchanged.  Head CT unremarkable for acute intracranial process.  UDS positive for benzodiazepines.  Neurology was consulted and EEG ordered.  EEG showing left parietal slowing consistent with postictal state in the absence of overt evidence of active seizures.  Long-term EEG x3 days was event free.  He was given loading dose of Keppra and Vimpat.  Patient with resulting functional deficits with mobility, self-care, cognition.  Please see preadmission assessment from earlier today as well.  Review of Systems  Unable to perform ROS: Mental acuity  Past Medical History:  Diagnosis Date   DM2 (diabetes mellitus, type 2) (HCC)    ETOH abuse    Hypertension    Seizures (HCC)    Past Surgical History:  Procedure Laterality Date   HEMORRHOID SURGERY     Family History  Problem Relation Age of  Onset   Diabetes Mother    Hypertension Mother    Diabetes Father    Hypertension Father    Hypertension Sister    Social History:  reports that he has been smoking cigars. He has never used smokeless tobacco. He reports that he does not currently use alcohol. He reports that he does not use drugs. Allergies:  Allergies  Allergen Reactions   Penicillins Hives    Did it involve swelling of the face/tongue/throat, SOB, or low BP? Y Did it involve sudden or severe rash/hives, skin peeling, or any reaction on the inside of your mouth or nose? N Did you need to seek medical attention at a hospital or doctor's office? Y When did it last happen?  Over 5 Years Ago     If all above answers are "NO", may proceed with cephalosporin use.    Medications Prior to Admission  Medication Sig Dispense Refill   cloNIDine (CATAPRES) 0.1 MG tablet Take 1 tablet (0.1 mg total) by mouth 2 (two) times daily. 60 tablet 0   lacosamide (VIMPAT) 50 MG TABS tablet Take 1 tablet (50 mg total) by mouth 2 (two) times daily. 60 tablet 11   levETIRAcetam (KEPPRA) 500 MG tablet Take 1 tablet (500 mg total) by mouth 2 (two) times daily. 60 tablet 12   metFORMIN (GLUCOPHAGE) 500 MG tablet Take 1 tablet (500 mg total) by mouth 2 (two) times daily with a meal. IM PROGRAM 60 tablet 0   amLODipine (NORVASC) 10 MG tablet Take 1 tablet (10 mg total) by mouth daily. (Patient not  taking: Reported on 02/24/2021) 30 tablet 0   aspirin EC 81 MG tablet Take 81 mg by mouth daily. Swallow whole. (Patient not taking: No sig reported)     Multiple Vitamin (MULTIVITAMIN WITH MINERALS) TABS tablet Take 1 tablet by mouth daily. (Patient not taking: No sig reported)      Drug Regimen Review Drug regimen was reviewed and remains appropriate with no significant issues identified  Home: Home Living Family/patient expects to be discharged to:: Private residence Living Arrangements: Spouse/significant other Available Help at Discharge:  Family Type of Home: House Home Access: Stairs to enter Secretary/administrator of Steps: 2 Home Layout: One level Bathroom Shower/Tub: Health visitor: Handicapped height Bathroom Accessibility: Yes Home Equipment: Environmental consultant - 2 wheels Additional Comments: information collected from admittance 09/2020   Functional History: Prior Function Level of Independence: Independent Comments: pt unable to provide info  Functional Status:  Mobility: Bed Mobility Overal bed mobility: Needs Assistance Bed Mobility: Supine to Sit Rolling: Min assist Supine to sit: Min guard, HOB elevated Sit to supine: Min guard General bed mobility comments: increased time, decreased initiation, cues for sequencing Transfers Overall transfer level: Needs assistance Equipment used: Ambulation equipment used Transfers: Sit to/from Stand Sit to Stand: Min guard, Min assist Stand pivot transfers: Max assist General transfer comment: LOB posteriorly with initial stance requiring min assist to correct Ambulation/Gait Ambulation/Gait assistance: Min assist Gait Distance (Feet): 75 Feet Assistive device: 1 person hand held assist Gait Pattern/deviations: Step-through pattern, Decreased stride length General Gait Details: decreased step height bilat. Slow, guarded gait. HHA on L Gait velocity: decreased Gait velocity interpretation: <1.8 ft/sec, indicate of risk for recurrent falls    ADL: ADL Overall ADL's : Needs assistance/impaired Eating/Feeding: Maximal assistance, Sitting, Bed level Grooming: Wash/dry hands, Wash/dry face, Moderate assistance, Sitting Upper Body Bathing: Total assistance, Sitting Lower Body Bathing: Total assistance, Sit to/from stand Upper Body Dressing : Maximal assistance, Sitting Upper Body Dressing Details (indicate cue type and reason): Pt did assist with threading arms through sleeves Lower Body Dressing: Minimal assistance, Bed level Lower Body Dressing Details  (indicate cue type and reason): pt able to don socks in long sitting while sitting in the bed with simple verbal cues and min A for assistance wtih L sock Toilet Transfer: Moderate assistance, +2 for physical assistance, +2 for safety/equipment Toilet Transfer Details (indicate cue type and reason): simulated - short 40ft ambulation with +2 HH assist Toileting- Clothing Manipulation and Hygiene: Total assistance Toileting - Clothing Manipulation Details (indicate cue type and reason): Pt incontinent of urine and was assisted with clean up Functional mobility during ADLs: Moderate assistance, +2 for physical assistance, +2 for safety/equipment General ADL Comments: pt limited by confusion, L sided weakness and continues to required +2 assist for all OOB tasks  Cognition: Cognition Overall Cognitive Status: Impaired/Different from baseline Orientation Level: Oriented to person Cognition Arousal/Alertness: Awake/alert Behavior During Therapy: WFL for tasks assessed/performed (pleasant and cooperative) Overall Cognitive Status: Impaired/Different from baseline Area of Impairment: Orientation, Memory, Safety/judgement, Awareness, Problem solving, Attention, Following commands Orientation Level: Disoriented to, Place, Time, Situation Current Attention Level: Sustained Memory: Decreased short-term memory Following Commands: Follows one step commands consistently Safety/Judgement: Decreased awareness of deficits, Decreased awareness of safety Awareness: Emergent Problem Solving: Difficulty sequencing, Requires verbal cues General Comments: mildly perseverative, verbal cues for sequencing Difficult to assess due to: Impaired communication  Physical Exam: Blood pressure 121/83, pulse 79, temperature 98.2 F (36.8 C), temperature source Oral, resp. rate 19, height 5\' 3"  (1.6  m), weight 66.4 kg, SpO2 100 %. Physical Exam Vitals reviewed.  Constitutional:      General: He is not in acute  distress.    Appearance: Normal appearance.  HENT:     Head: Normocephalic and atraumatic.     Right Ear: External ear normal.     Left Ear: External ear normal.     Nose: Nose normal.  Eyes:     General:        Right eye: No discharge.        Left eye: No discharge.     Extraocular Movements: Extraocular movements intact.  Cardiovascular:     Rate and Rhythm: Normal rate and regular rhythm.  Pulmonary:     Effort: Pulmonary effort is normal. No respiratory distress.     Breath sounds: No stridor.  Abdominal:     General: Abdomen is flat. Bowel sounds are normal. There is no distension.  Musculoskeletal:     Cervical back: Normal range of motion and neck supple.     Comments: No edema or tenderness in extremities  Skin:    General: Skin is warm and dry.  Neurological:     Mental Status: He is alert.     Comments: Alert and oriented x1 Motor: 4/5 throughout  Psychiatric:        Mood and Affect: Affect is flat.        Speech: Speech is delayed.        Behavior: Behavior is slowed.        Cognition and Memory: Cognition is impaired.    Results for orders placed or performed during the hospital encounter of 02/24/21 (from the past 48 hour(s))  Glucose, capillary     Status: Abnormal   Collection Time: 03/09/21  4:35 PM  Result Value Ref Range   Glucose-Capillary 127 (H) 70 - 99 mg/dL    Comment: Glucose reference range applies only to samples taken after fasting for at least 8 hours.  Glucose, capillary     Status: Abnormal   Collection Time: 03/09/21 10:15 PM  Result Value Ref Range   Glucose-Capillary 132 (H) 70 - 99 mg/dL    Comment: Glucose reference range applies only to samples taken after fasting for at least 8 hours.  Glucose, capillary     Status: Abnormal   Collection Time: 03/10/21  6:17 AM  Result Value Ref Range   Glucose-Capillary 119 (H) 70 - 99 mg/dL    Comment: Glucose reference range applies only to samples taken after fasting for at least 8 hours.   Basic metabolic panel     Status: Abnormal   Collection Time: 03/10/21  7:24 AM  Result Value Ref Range   Sodium 130 (L) 135 - 145 mmol/L   Potassium 4.3 3.5 - 5.1 mmol/L   Chloride 99 98 - 111 mmol/L   CO2 22 22 - 32 mmol/L   Glucose, Bld 114 (H) 70 - 99 mg/dL    Comment: Glucose reference range applies only to samples taken after fasting for at least 8 hours.   BUN 23 (H) 6 - 20 mg/dL   Creatinine, Ser 2.77 (H) 0.61 - 1.24 mg/dL   Calcium 8.9 8.9 - 82.4 mg/dL   GFR, Estimated >23 >53 mL/min    Comment: (NOTE) Calculated using the CKD-EPI Creatinine Equation (2021)    Anion gap 9 5 - 15    Comment: Performed at [facility], [address], [place], [place] [address]  Glucose, capillary  Status: Abnormal   Collection Time: 03/10/21 12:04 PM  Result Value Ref Range   Glucose-Capillary 121 (H) 70 - 99 mg/dL    Comment: Glucose reference range applies only to samples taken after fasting for at least 8 hours.  Glucose, capillary     Status: Abnormal   Collection Time: 03/10/21  4:01 PM  Result Value Ref Range   Glucose-Capillary 130 (H) 70 - 99 mg/dL    Comment: Glucose reference range applies only to samples taken after fasting for at least 8 hours.  Glucose, capillary     Status: Abnormal   Collection Time: 03/10/21  9:51 PM  Result Value Ref Range   Glucose-Capillary 100 (H) 70 - 99 mg/dL    Comment: Glucose reference range applies only to samples taken after fasting for at least 8 hours.  Basic metabolic panel     Status: Abnormal   Collection Time: 03/11/21  4:56 AM  Result Value Ref Range   Sodium 131 (L) 135 - 145 mmol/L   Potassium 4.6 3.5 - 5.1 mmol/L   Chloride 99 98 - 111 mmol/L   CO2 24 22 - 32 mmol/L   Glucose, Bld 95 70 - 99 mg/dL    Comment: Glucose reference range applies only to samples taken after fasting for at least 8 hours.   BUN 17 6 - 20 mg/dL   Creatinine, Ser 3.32 0.61 - 1.24 mg/dL   Calcium 8.9 8.9 - 95.1 mg/dL   GFR, Estimated >88 >41  mL/min    Comment: (NOTE) Calculated using the CKD-EPI Creatinine Equation (2021)    Anion gap 8 5 - 15    Comment: Performed at [facility], [address], [place], [place] [address]  Glucose, capillary     Status: Abnormal   Collection Time: 03/11/21  6:30 AM  Result Value Ref Range   Glucose-Capillary 110 (H) 70 - 99 mg/dL    Comment: Glucose reference range applies only to samples taken after fasting for at least 8 hours.  Glucose, capillary     Status: Abnormal   Collection Time: 03/11/21 11:32 AM  Result Value Ref Range   Glucose-Capillary 114 (H) 70 - 99 mg/dL    Comment: Glucose reference range applies only to samples taken after fasting for at least 8 hours.   Comment 1 Notify RN    Comment 2 Document in Chart    No results found.     Medical Problem List and Plan: 1.  Deficits in mobility, self-care, cognition secondary to acute encephalopathy.             -patient may shower             -ELOS/Goals: 6-9 days/supervision  Admit to CIR 2.  DVT prophylaxis/anticoagulation:   -Mechanical: Sequential compression devices, below knee Bilateral lower extremities Pharmaceutical: Lovenox:              -antiplatelet therapy: ASA 81 3. Pain Management: As needed meds  Team support 4.  Mood: Klonopin 1 mg twice daily for anxiety             -antipsychotic agents:N/A 5. Neuropsych: This patient is not capable of making decisions on his own behalf. 6. Skin/Wound Care: Routine skin care 7. Fluids/Electrolytes/Nutrition: Routine I/Os  CMP ordered 8.  Essential hypertension  Norvasc 10, clonidine 0.1 mg twice daily, Lisinopril 10 mg daily Monitor with increased mobility 9.  Seizures Perampanel 4 mg x 5 days, then 2 mg x 10 days Phenytoin  50 mg 3 times daily x5 days, then 50 mg twice daily x5 days, then 50 mg daily x5 days Vimpat 200 mg twice daily  Keppra 1500 mg twice daily Monitor for breakthrough seizures 10.  Diabetes mellitus type 2  Metformin 500 mg twice  daily PTA  Monitor with increased mobility 11.  Hyponatremia  Sodium 131 on 10/15  CMP ordered  03/11/2021   The patient's status has not changed. Any changes from the pre-admission screening or documentation from the acute chart are noted above.   Maryla Morrow, MD, ABPMR

## 2021-03-12 NOTE — Plan of Care (Signed)
  Problem: Sit to Stand Goal: LTG:  Patient will perform sit to stand in prep for activites of daily living with assistance level (OT) Description: LTG:  Patient will perform sit to stand in prep for activites of daily living with assistance level (OT) Flowsheets (Taken 03/12/2021 1021) LTG: PT will perform sit to stand in prep for activites of daily living with assistance level: Supervision/Verbal cueing   Problem: RH Grooming Goal: LTG Patient will perform grooming w/assist,cues/equip (OT) Description: LTG: Patient will perform grooming with assist, with/without cues using equipment (OT) Flowsheets (Taken 03/12/2021 1021) LTG: Pt will perform grooming with assistance level of: Supervision/Verbal cueing   Problem: RH Bathing Goal: LTG Patient will bathe all body parts with assist levels (OT) Description: LTG: Patient will bathe all body parts with assist levels (OT) Flowsheets (Taken 03/12/2021 1021) LTG: Pt will perform bathing with assistance level/cueing: Supervision/Verbal cueing   Problem: RH Dressing Goal: LTG Patient will perform lower body dressing w/assist (OT) Description: LTG: Patient will perform lower body dressing with assist, with/without cues in positioning using equipment (OT) Flowsheets (Taken 03/12/2021 1021) LTG: Pt will perform lower body dressing with assistance level of: Supervision/Verbal cueing   Problem: RH Toileting Goal: LTG Patient will perform toileting task (3/3 steps) with assistance level (OT) Description: LTG: Patient will perform toileting task (3/3 steps) with assistance level (OT)  Flowsheets (Taken 03/12/2021 1021) LTG: Pt will perform toileting task (3/3 steps) with assistance level: Supervision/Verbal cueing   Problem: RH Toilet Transfers Goal: LTG Patient will perform toilet transfers w/assist (OT) Description: LTG: Patient will perform toilet transfers with assist, with/without cues using equipment (OT) Flowsheets (Taken 03/12/2021  1021) LTG: Pt will perform toilet transfers with assistance level of: Supervision/Verbal cueing   Problem: RH Tub/Shower Transfers Goal: LTG Patient will perform tub/shower transfers w/assist (OT) Description: LTG: Patient will perform tub/shower transfers with assist, with/without cues using equipment (OT) Flowsheets (Taken 03/12/2021 1021) LTG: Pt will perform tub/shower stall transfers with assistance level of: Supervision/Verbal cueing   Problem: RH Balance Goal: LTG: Patient will maintain dynamic sitting balance (OT) Description: LTG:  Patient will maintain dynamic sitting balance with assistance during activities of daily living (OT) Flowsheets (Taken 03/12/2021 1022) LTG: Pt will maintain dynamic sitting balance during ADLs with: Supervision/Verbal cueing Goal: LTG Patient will maintain dynamic standing with ADLs (OT) Description: LTG:  Patient will maintain dynamic standing balance with assist during activities of daily living (OT)  Flowsheets (Taken 03/12/2021 1022) LTG: Pt will maintain dynamic standing balance during ADLs with: Supervision/Verbal cueing

## 2021-03-12 NOTE — Discharge Instructions (Addendum)
Inpatient Rehab Discharge Instructions  MEHTAB DOLBERRY Discharge date and time: No discharge date for patient encounter.   Activities/Precautions/ Functional Status: Activity: As tolerated Diet: Diabetic diet Wound Care: Routine skin checks Functional status:  ___ No restrictions     ___ Walk up steps independently ___ 24/7 supervision/assistance   ___ Walk up steps with assistance ___ Intermittent supervision/assistance  ___ Bathe/dress independently ___ Walk with walker     _x__ Bathe/dress with assistance ___ Walk Independently    ___ Shower independently ___ Walk with assistance    ___ Shower with assistance ___ No alcohol     ___ Return to work/school ________   COMMUNITY REFERRALS UPON DISCHARGE:    Home Health:   PT     OT     ST      SW                    Agency: Phone:     GENERAL COMMUNITY RESOURCES FOR PATIENT/FAMILY: A referral has been submitted to [facility] [phone] for personal care services (PCS). At this current time, he has been approved for services in which 60hrs are alloted for the month. Hours are pending confirmation until the nurse completes their assessment. Referral will be sent to the following agencies: 1)[facility], 2) [facility] [place], and 3) [facility]. IF you have any questions about the status of the referral, be sure to contact [facility] to discuss further.   Special Instructions: No driving smoking or alcohol   My questions have been answered and I understand these instructions. I will adhere to these goals and the provided educational materials after my discharge from the hospital.  Patient/Caregiver Signature _______________________________ Date __________  Clinician Signature _______________________________________ Date __________  Please bring this form and your medication list with you to all your follow-up doctor's appointments.

## 2021-03-12 NOTE — Progress Notes (Signed)
Occupational Therapy Session Note  Patient Details  Name: Arthur [name] MRN: [id number] Date of Birth: [birth year]  Today's Date: 03/12/2021 OT Individual Time: 8502-7741 OT Individual Time Calculation (min): 25 min    Short Term Goals: Week 1:  OT Short Term Goal 1 (Week 1): STGs=LTGs due to ELOS  Skilled Therapeutic Interventions/Progress Updates:  Pt greeted in bed with no c/o pain. Supine<sit from flat bed without bedrails completed with setup assistance. Pt then completed dynamometer testing with no significant strength difference bilaterally in hands. CGA for ambulatory transfer to the toilet without AD. Pt with +bladder void in standing, required cues for locating the flush lever. Vcs after for using soap to wash hands and how to pull paper towels from dispenser (pt kept pressing the blue part of the dispenser for the paper towels to eject). Asked him to locate the trash can to dispose of paper towel with pt responding "I know" and then returning to EOB where he then placed the paper towel on the dresser. Pt wanted to call his wife, provided OT with several phone numbers that were incorrect. Obtained phone number from admission notes. Pt unable to tell me his wife's name. Max A to call her on the phone as pt was unable to press the correct buttons written on his white board or when OT provided verbal dictation. He remained on the phone with his wife at close of session, all needs within reach and bed alarm set.  Therapy Documentation Precautions:  Precautions Precautions: Fall Precaution Comments: profound cognitive deficits and impaired communication; seizures Restrictions Weight Bearing Restrictions: No  Vital Signs: Therapy Vitals Temp: 97.7 F (36.5 C) Temp Source: Oral Pulse Rate: 85 Resp: 18 BP: 113/76 Patient Position (if appropriate): Lying Oxygen Therapy SpO2: 100 % O2 Device: Room Air ADL: ADL Eating: Not assessed Grooming: Minimal assistance Where  Assessed-Grooming: Standing at sink Upper Body Bathing: Minimal assistance Where Assessed-Upper Body Bathing: Shower Lower Body Bathing: Minimal assistance Where Assessed-Lower Body Bathing: Shower Upper Body Dressing: Supervision/safety Where Assessed-Upper Body Dressing: Chair Lower Body Dressing: Contact guard Where Assessed-Lower Body Dressing: Chair Toileting: Minimal assistance Where Assessed-Toileting: Teacher, adult education: Curator Method: Ambulating (no AD) Acupuncturist: Acupuncturist: Insurance underwriter Method: Ambulating (without AD) Astronomer: Emergency planning/management officer, Grab bars Vision Baseline Vision/History: 0 No visual deficits (per pt, question accuracy) Patient Visual Report: No change from baseline Vision Assessment?: Vision impaired- to be further tested in functional context Perception  Perception: Within Functional Limits Praxis Praxis: Impaired Praxis Impairment Details: Perseveration;Motor planning;Initiation   Therapy/Group: Individual Therapy  Maeryn Mcgath A Jamine Highfill 03/12/2021, 3:53 PM

## 2021-03-12 NOTE — Plan of Care (Signed)
  Problem: RH BOWEL ELIMINATION Goal: RH STG MANAGE BOWEL WITH ASSISTANCE Description: STG Manage Bowel with Assistance. Flowsheets (Taken 03/12/2021 0336) STG: Pt will manage bowels with assistance: 6-Modified independent Goal: RH STG MANAGE BOWEL W/MEDICATION W/ASSISTANCE Description: STG Manage Bowel with Medication with Assistance. Flowsheets (Taken 03/12/2021 0336) STG: Pt will manage bowels with medication with assistance: 6-Modified independent   Problem: RH BLADDER ELIMINATION Goal: RH STG MANAGE BLADDER WITH ASSISTANCE Description: STG Manage Bladder With Assistance Flowsheets (Taken 03/12/2021 0336) STG: Pt will manage bladder with assistance: 4-Minimal assistance   Problem: RH SAFETY Goal: RH STG ADHERE TO SAFETY PRECAUTIONS W/ASSISTANCE/DEVICE Description: STG Adhere to Safety Precautions With Assistance/Device. Flowsheets (Taken 03/12/2021 [id number]) STG:Pt will adhere to safety precautions with assistance/device: 4-Minimal assistance

## 2021-03-12 NOTE — Evaluation (Signed)
Physical Therapy Assessment and Plan  Patient Details  Name: Arthur [name] MRN: [id number] Date of Birth: 1960-09-30  PT Diagnosis: Abnormal posture, Abnormality of gait, Cognitive deficits, Coordination disorder, Difficulty walking, Impaired cognition, and Muscle weakness Rehab Potential: Good ELOS: 7-10 days   Today's Date: 03/12/2021 PT Individual Time: 1300-1409 PT Individual Time Calculation (min): 69 min    Hospital Problem: Principal Problem:   Encephalopathy acute Active Problems:   Controlled type 2 diabetes mellitus with hyperglycemia, without long-term current use of insulin (HCC)   Past Medical History:  Past Medical History:  Diagnosis Date   DM2 (diabetes mellitus, type 2) ([place])    ETOH abuse    Hypertension    Seizures ([place])    Past Surgical History:  Past Surgical History:  Procedure Laterality Date   HEMORRHOID SURGERY      Assessment & Plan Clinical Impression: Patient is a 60 y.o. year old male with past medical history of diabetes mellitus type 2, EtOH abuse, hypertension, seizures presented to hospital on 02/24/2021 with seizures.  Patient was at home and presented to the ED with anxiety.  History taken from chart review due to cognition.  Patient's father recently passed away with resulting increase in stress as well as depression.  Patient was noted to have twitching of 1 side of his face, however no other associated symptoms and thought to be stress related by patient and family.  Upon presentation to the ED patient was noted to have tonic-clonic activity of bilateral upper extremities without AMS or loss of consciousness.  He received loperamide with resolution of symptoms.  He later had right facial twitching with AMS and decreasing responsiveness.  He was given IV Ativan with resolution of twitching.  There was no associated bowel/bladder incontinence or tongue biting.  Labs were ordered showing creatinine of 1.20 (around patient's baseline) EKG was  unchanged.  Head CT unremarkable for acute intracranial process.  UDS positive for benzodiazepines.  Neurology was consulted and EEG ordered.  EEG showing left parietal slowing consistent with postictal state in the absence of overt evidence of active seizures.  Long-term EEG x3 days was event free.  He was given loading dose of Keppra and Vimpat.  Patient with resulting functional deficits with mobility, self-care, cognition.  Please see preadmission assessment from earlier today as well.  Patient currently requires min with mobility secondary to muscle weakness, decreased cardiorespiratoy endurance, impaired timing and sequencing, unbalanced muscle activation, decreased coordination, and decreased motor planning, decreased motor planning, decreased initiation, decreased attention, decreased awareness, decreased problem solving, decreased safety awareness, decreased memory, and delayed processing, and decreased sitting balance, decreased standing balance, decreased postural control, and decreased balance strategies.  Prior to hospitalization, patient was independent  with mobility and lived with Spouse in a House home.  Home access is 6Stairs to enter (pt reports 6 STE but per chart has 2 STE).  Patient will benefit from skilled PT intervention to maximize safe functional mobility, minimize fall risk, and decrease caregiver burden for planned discharge home with 24 hour supervision.  Anticipate patient will benefit from follow up [place] at discharge.  PT - End of Session Activity Tolerance: Tolerates 30+ min activity with multiple rests Endurance Deficit: Yes Endurance Deficit Description: required rest breaks throughout and SOB after navigating stairs PT Assessment Rehab Potential (ACUTE/IP ONLY): Good PT Barriers to Discharge: [place] home environment;Decreased caregiver support;Home environment access/layout;Behavior;Other (comments) PT Barriers to Discharge Comments: cognitive impairements,  difficulty communicating, decreased safety awareness, decreased balance/postural control, unsure how  much assist wife is able to provide. PT Patient demonstrates impairments in the following area(s): Balance;Behavior;Endurance;Motor;Nutrition;Perception;Safety PT Transfers Functional Problem(s): Bed Mobility;Bed to Chair;Car;Furniture PT Locomotion Functional Problem(s): Ambulation;Wheelchair Mobility;Stairs PT Plan PT Intensity: Minimum of 1-2 x/day ,45 to 90 minutes PT Frequency: 5 out of 7 days;Total of 15 hours over 7 days of combined therapies PT Duration Estimated Length of Stay: 7-10 days PT Treatment/Interventions: Ambulation/gait training;Discharge planning;Functional mobility training;Psychosocial support;Therapeutic Activities;Visual/perceptual remediation/compensation;Balance/vestibular training;Disease management/prevention;Neuromuscular re-education;Skin care/wound management;Therapeutic Exercise;Wheelchair propulsion/positioning;Cognitive remediation/compensation;DME/adaptive equipment instruction;Pain management;Splinting/orthotics;UE/LE Strength taining/ROM;Community reintegration;Patient/family education;Stair training;UE/LE Coordination activities PT Transfers Anticipated Outcome(s): supervision with LRAD PT Locomotion Anticipated Outcome(s): supervision with LRAD PT Recommendation Recommendations for Other Services: Speech consult Follow Up Recommendations: Home health PT Patient destination: Home Equipment Recommended: To be determined   PT Evaluation Precautions/Restrictions Precautions Precautions: Fall Precaution Comments: profound cognitive deficits and impaired communication; seizures Restrictions Weight Bearing Restrictions: No Pain Interference Pain Interference Pain Effect on Sleep: 0. Does not apply - I have not had any pain or hurting in the past 5 days Pain Interference with Therapy Activities: 0. Does not apply - I have not received rehabilitationtherapy  in the past 5 days Pain Interference with Day-to-Day Activities: 1. Rarely or not at all Home Living/Prior [place] Available Help at Discharge: Family Type of Home: House Home Access: Stairs to enter (pt reports 6 STE but per chart has 2 STE) Entrance Stairs-Number of Steps: 6 Entrance Stairs-Rails: None Home Layout: One level Bathroom Shower/Tub: Multimedia programmer: Handicapped height Bathroom Accessibility: Yes Additional Comments: Some home environment information obtained from admission notes due to pts cognitive + communication deficits  Lives With: Spouse Prior Function Level of Independence: Independent with basic ADLs;Independent with gait;Independent with homemaking with ambulation;Independent with transfers  Able to Take Stairs?: Yes Driving: Yes Vision/Perception  Vision - History Ability to See in Adequate Light: 2 Moderately impaired (unable to read wall clock or pt information from the board in his room, ?visual deficit vs cognitive vs visual + cognitive deficits combined) Perception Perception: Within Functional Limits Praxis Praxis: Impaired Praxis Impairment Details: Perseveration;Motor planning;Initiation  Cognition Overall Cognitive Status: No family/caregiver present to determine baseline cognitive functioning Arousal/Alertness: Awake/alert Orientation Level: Oriented to person Memory: Impaired Awareness: Impaired Problem Solving: Impaired Behaviors: Impulsive;Perseveration Safety/Judgment: Impaired Comments: Impaired safety awareness and insight into deficits; mild impulsivity Sensation Sensation Light Touch: Appears Intact Proprioception: Impaired by gross assessment Coordination Gross Motor Movements are Fluid and Coordinated: No Fine Motor Movements are Fluid and Coordinated: No Coordination and Movement Description: grossly uncoordinated due to impaired sequencing/motor planning, decreased balance/postural control,  generalized weakness, and decreased awareness. Finger Nose Finger Test: overshooting bilaterally, difficulty understanding what therapist was asking Heel Shin Test: ROM WFL but poor sequencing Motor  Motor Motor: Other (comment);Abnormal postural alignment and control Motor - Skilled Clinical Observations: uncoordinated due to impaired sequencing/motor planning, posterior bias in standing, generalized weakness/deconditioning, and decreased balance.  Trunk/Postural Assessment  Cervical Assessment Cervical Assessment: Within Functional Limits Thoracic Assessment Thoracic Assessment: Within Functional Limits Lumbar Assessment Lumbar Assessment: Exceptions to [facility] (posterior pelvic tilt) Postural Control Postural Control: Deficits on evaluation (posterior bias in sitting and standing)  Balance Balance Balance Assessed: Yes Static Sitting Balance Static Sitting - Balance Support: Feet supported;Bilateral upper extremity supported Static Sitting - Level of Assistance: 5: Stand by assistance (supervision) Dynamic Sitting Balance Dynamic Sitting - Balance Support: Feet supported;No upper extremity supported Dynamic Sitting - Level of Assistance: 5: Stand by assistance (CGA) Static Standing Balance Static Standing - Balance  Support: No upper extremity supported Static Standing - Level of Assistance: 5: Stand by assistance (CGA) Dynamic Standing Balance Dynamic Standing - Balance Support: No upper extremity supported Dynamic Standing - Level of Assistance: 4: Min assist Dynamic Standing - Balance Activities: Lateral lean/weight shifting;Forward lean/weight shifting (shower transfer without AD) Dynamic Standing - Comments: with gait and transfers Extremity Assessment  RLE Assessment RLE Assessment: Exceptions to [facility] General Strength Comments: not formally tested due to cognitive impairments and difficulty communicating; grossly 4-/5 LLE Assessment LLE Assessment: Exceptions to [facility] General  Strength Comments: not formally tested due to cognitive impairments and difficulty communicating; grossly 4-/5  Care Tool Care Tool Bed Mobility Roll left and right activity   Roll left and right assist level: Supervision/Verbal cueing    Sit to lying activity   Sit to lying assist level: Supervision/Verbal cueing    Lying to sitting on side of bed activity   Lying to sitting on side of bed assist level: the ability to move from lying on the back to sitting on the side of the bed with no back support.: Supervision/Verbal cueing     Care Tool Transfers Sit to stand transfer   Sit to stand assist level: Minimal Assistance - Patient > 75%    Chair/bed transfer   Chair/bed transfer assist level: Minimal Assistance - Patient > 75%     Toilet transfer   Assist Level: Minimal Assistance - Patient > 75%    Car transfer   Car transfer assist level: Minimal Assistance - Patient > 75%      Care Tool Locomotion Ambulation   Assist level: Minimal Assistance - Patient > 75% Assistive device: No Device Max distance: 54ft  Walk 10 feet activity   Assist level: Minimal Assistance - Patient > 75% Assistive device: No Device   Walk 50 feet with 2 turns activity   Assist level: Minimal Assistance - Patient > 75% Assistive device: No Device  Walk 150 feet activity Walk 150 feet activity did not occur: Safety/medical concerns (fatigue, weakness, decreased balance)      Walk 10 feet on uneven surfaces activity   Assist level: Minimal Assistance - Patient > 75% Assistive device: Other (comment) (handrail)  Stairs   Assist level: Minimal Assistance - Patient > 75% Stairs assistive device: 2 hand rails Max number of stairs: 12  Walk up/down 1 step activity   Walk up/down 1 step (curb) assist level: Minimal Assistance - Patient > 75% Walk up/down 1 step or curb assistive device: 2 hand rails    Walk up/down 4 steps activity Walk up/down 4 steps assist level: Minimal Assistance - Patient >  75% Walk up/down 4 steps assistive device: 2 hand rails  Walk up/down 12 steps activity   Walk up/down 12 steps assist level: Minimal Assistance - Patient > 75% Walk up/down 12 steps assistive device: 2 hand rails  Pick up small objects from floor   Pick up small object from the floor assist level: Minimal Assistance - Patient > 75% Pick up small object from the floor assistive device: spray bottle without AD  Wheelchair Is the patient using a wheelchair?: Yes Type of Wheelchair: Manual   Wheelchair assist level: Supervision/Verbal cueing Max wheelchair distance: 35ft  Wheel 50 feet with 2 turns activity   Assist Level: Dependent - Patient 0%  Wheel 150 feet activity   Assist Level: Dependent - Patient 0%    Refer to Care Plan for Long Term Goals  SHORT TERM GOAL WEEK 1 PT Short  Term Goal 1 (Week 1): STG=LTG due to LOS  Recommendations for other services: Other: Speech Therapy  Skilled Therapeutic Intervention Evaluation completed (see details above and below) with education on PT POC and goals and individual treatment initiated with focus on functional mobility/transfers, generalized strengthening, dynamic standing balance/coordination, ambulation, stair navigation, simulated car transfers, and improved activity tolerance. Received pt semi-reclined in bed, pt educated on PT evaluation, CIR policies, and therapy schedule and agreeable. Pt denied any pain during session. Provided pt with 16x16 maual WC, cushion, and legrests. Pt transferred semi-reclined<>sitting EOB with supervision and stand<>pivot bed<>WC without AD and min A. Pt performed WC mobility 67ft using BUE and supervision but required max cues for sequencing/propulsion technique as pt going in small circles; overall demonstrated poor carry over of cues for technique. Pt ambulated 56ft without AD and min A. Pt demonstrated decreased cadence, narrow BOS, decreased bilateral foot clearance, and posterior bias and required cues for  technique. Pt able to stand and pick up spray bottle from floor without AD and min A. Pt then navigated 12 steps with 2 rails and min A ascending and descending with a step through pattern - cues to ensure entire foot on step prior to proceeding. In ortho gym, pt performed ambulatory simulated car transfer without AD and min A. Pt scooted over to driver's seat and began playing with wheel and pretending to hit gas pedal and brakes (reporting really enjoying the car activity). Pt ambulated 42ft on uneven surfaces (ramp and mulch) and descended 1 6in curb with 1 UE support and min A - cues for technique at pt taking too big of a step when coming down curb. Pt then performed TUG without AD and min A with average of 49 seconds. Trial 1: 57 seconds Trial 2: 44 seconds Trial 3: 47 seconds  Returned to room and pt ambulated 62ft without AD and min A to bed. 1 posterior LOB backwards onto bed with little awareness. Sit<>supine with supervision and concluded session with pt supine in bed, needs within reach, and bed alarm on. Switched out bariatric RW in room for appropriate height one and RN present at bedside.   Mobility Bed Mobility Bed Mobility: Rolling Right;Rolling Left;Sit to Supine;Supine to Sit Rolling Right: Supervision/verbal cueing Rolling Left: Supervision/Verbal cueing Supine to Sit: Supervision/Verbal cueing Sit to Supine: Supervision/Verbal cueing Transfers Transfers: Sit to Stand;Stand to Sit;Stand Pivot Transfers Sit to Stand: Minimal Assistance - Patient > 75% Stand to Sit: Minimal Assistance - Patient > 75% Stand Pivot Transfers: Minimal Assistance - Patient > 75% Stand Pivot Transfer Details: Verbal cues for sequencing;Verbal cues for technique Transfer (Assistive device): None Locomotion  Gait Ambulation: Yes Gait Assistance: Minimal Assistance - Patient > 75% Gait Distance (Feet): 80 Feet Assistive device: None Gait Assistance Details: Verbal cues for technique;Verbal cues for  gait pattern Gait Assistance Details: verbal cues for direction and to increase step length for safety Gait Gait: Yes Gait Pattern: Impaired Gait Pattern: Step-to pattern;Decreased stride length;Decreased dorsiflexion - right;Decreased dorsiflexion - left;Decreased trunk rotation;Decreased step length - right;Decreased step length - left;Poor foot clearance - left;Poor foot clearance - right;Narrow base of support Gait velocity: decreased Stairs / Additional Locomotion Stairs: Yes Stairs Assistance: Minimal Assistance - Patient > 75% Stair Management Technique: Two rails Number of Stairs: 12 Height of Stairs: 6 Ramp: Minimal Assistance - Patient >75% (1UE support) Curb: Minimal Assistance - Patient >75% (1 UE support) Wheelchair Mobility Wheelchair Mobility: Yes Wheelchair Assistance: Chartered loss adjuster: Both upper extremities Wheelchair Parts  Management: Needs assistance Distance: 29ft  Discharge Criteria: Patient will be discharged from PT if patient refuses treatment 3 consecutive times without medical reason, if treatment goals not met, if there is a change in medical status, if patient makes no progress towards goals or if patient is discharged from hospital.  The above assessment, treatment plan, treatment alternatives and goals were discussed and mutually agreed upon: by patient  Alfonse Alpers PT, DPT  03/12/2021, 2:22 PM

## 2021-03-12 NOTE — Progress Notes (Signed)
[place] PHYSICAL MEDICINE & REHABILITATION PROGRESS NOTE  Subjective/Complaints: Patient seen laying in bed this morning.  He states he slept well overnight.  He is indicates he is ready begin therapies, however, uncertain if actually aware.  ROS: Limited due to cognition  Objective: Vital Signs: Blood pressure 120/82, pulse 78, temperature 98.3 F (36.8 C), resp. rate 16, height 5\' 3"  (1.6 m), weight 67.8 kg, SpO2 100 %. No results found. No results for input(s): WBC, HGB, HCT, PLT in the last 72 hours. Recent Labs    03/10/21 0724 03/11/21 0456 03/11/21 1421  NA 130* 131*  --   K 4.3 4.6  --   CL 99 99  --   CO2 22 24  --   GLUCOSE 114* 95  --   BUN 23* 17  --   CREATININE 1.30* 1.15 1.04  CALCIUM 8.9 8.9  --     Intake/Output Summary (Last 24 hours) at 03/12/2021 0813 Last data filed at 03/12/2021 03/14/2021 Gross per 24 hour  Intake 435 ml  Output 1475 ml  Net -1040 ml     Pressure Injury 10/19/20 Face Left;Upper Stage 2 -  Partial thickness loss of dermis presenting as a shallow open injury with a red, pink wound bed without slough. pink circle chaped breakdown from EEG electrodes (Active)  10/19/20 1453  Location: Face  Location Orientation: Left;Upper  Staging: Stage 2 -  Partial thickness loss of dermis presenting as a shallow open injury with a red, pink wound bed without slough.  Wound Description (Comments): pink circle chaped breakdown from EEG electrodes  Present on Admission: No     Pressure Injury 10/19/20 Face Right;Upper Deep Tissue Pressure Injury - Purple or maroon localized area of discolored intact skin or blood-filled blister due to damage of underlying soft tissue from pressure and/or shear. dark discoloration in the shape  (Active)  10/19/20 1456  Location: Face  Location Orientation: Right;Upper  Staging: Deep Tissue Pressure Injury - Purple or maroon localized area of discolored intact skin or blood-filled blister due to damage of underlying  soft tissue from pressure and/or shear.  Wound Description (Comments): dark discoloration in the shape of the circle EEG electrode  Present on Admission: No    Physical Exam: BP 120/82 (BP Location: Left Arm)   Pulse 78   Temp 98.3 F (36.8 C)   Resp 16   Ht 5\' 3"  (1.6 m)   Wt 67.8 kg   SpO2 100%   BMI 26.48 kg/m  Constitutional: No distress . Vital signs reviewed. HENT: Normocephalic.  Atraumatic. Eyes: EOMI. No discharge. Cardiovascular: No JVD.  RRR. Respiratory: Normal effort.  No stridor.  Bilateral clear to auscultation. GI: Non-distended.  BS +. Skin: Warm and dry.  Intact. Psych: Normal mood.  Slowed.. Musc: No edema in extremities.  No tenderness in extremities. Neuro: Alert and oriented x1 Motor: 4/5 throughout, unchanged  Assessment/Plan: 1. Functional deficits which require 3+ hours per day of interdisciplinary therapy in a comprehensive inpatient rehab setting. Physiatrist is providing close team supervision and 24 hour management of active medical problems listed below. Physiatrist and rehab team continue to assess barriers to discharge/monitor patient progress toward functional and medical goals   Care Tool:  Bathing              Bathing assist       Upper Body Dressing/Undressing Upper body dressing        Upper body assist      Lower Body Dressing/Undressing Lower  body dressing            Lower body assist       Toileting Toileting    Toileting assist Assist for toileting: Moderate Assistance - Patient 50 - 74%     Transfers Chair/bed transfer  Transfers assist           Locomotion Ambulation   Ambulation assist              Walk 10 feet activity   Assist           Walk 50 feet activity   Assist           Walk 150 feet activity   Assist           Walk 10 feet on uneven surface  activity   Assist           Wheelchair     Assist               Wheelchair 50 feet  with 2 turns activity    Assist            Wheelchair 150 feet activity     Assist           Medical Problem List and Plan: 1.  Deficits in mobility, self-care, cognition secondary to acute encephalopathy.  Begin CIR evaluations 2.  DVT prophylaxis/anticoagulation:   Mechanical: Sequential compression devices, below knee Bilateral lower extremities Pharmaceutical: Lovenox  Creatinine within normal limits on 10/15             -antiplatelet therapy: ASA 81 3. Pain Management: As needed meds             Team support 4.  Mood: Klonopin 1 mg twice daily for anxiety             -antipsychotic agents:N/A 5. Neuropsych: This patient is not capable of making decisions on his own behalf. 6. Skin/Wound Care: Routine skin care 7. Fluids/Electrolytes/Nutrition: Routine I/Os             CMP ordered for tomorrow 8.  Essential hypertension             Norvasc 10, clonidine 0.1 mg twice daily, Lisinopril 10 mg daily Monitor with increased mobility 9.  Seizures Perampanel 4 mg x 5 days, then 2 mg x 10 days Phenytoin 50 mg 3 times daily x5 days starting on 10/15, then 50 mg twice daily x5 days, then 50 mg daily x5 days Vimpat 200 mg twice daily             Keppra 1500 mg twice daily Monitor for breakthrough seizures 10.  Diabetes mellitus type 2             Metformin 500 mg twice daily PTA             Monitor with increased activity 11.  Hyponatremia             Sodium 131 on 10/15             CMP ordered for tomorrow  LOS: 1 days A FACE TO FACE EVALUATION WAS PERFORMED  Vandy Fong Karis Juba 03/12/2021, 8:13 AM

## 2021-03-13 LAB — CBC
HCT: 39.1 % (ref 39.0–52.0)
Hemoglobin: 13 g/dL (ref 13.0–17.0)
MCH: 25.1 pg — ABNORMAL LOW (ref 26.0–34.0)
MCHC: 33.2 g/dL (ref 30.0–36.0)
MCV: 75.6 fL — ABNORMAL LOW (ref 80.0–100.0)
Platelets: 349 10*3/uL (ref 150–400)
RBC: 5.17 MIL/uL (ref 4.22–5.81)
RDW: 13.6 % (ref 11.5–15.5)
WBC: 5.4 10*3/uL (ref 4.0–10.5)
nRBC: 0 % (ref 0.0–0.2)

## 2021-03-13 LAB — GLUCOSE, CAPILLARY
Glucose-Capillary: 96 mg/dL (ref 70–99)
Glucose-Capillary: 109 mg/dL — ABNORMAL HIGH (ref 70–99)
Glucose-Capillary: 138 mg/dL — ABNORMAL HIGH (ref 70–99)

## 2021-03-13 NOTE — Progress Notes (Signed)
Occupational Therapy Session Note  Patient Details  Name: Arthur [name] MRN: [id number] Date of Birth: [birth year]  Today's Date: 03/13/2021 OT Individual Time: 1607-3710 OT Individual Time Calculation (min): 24 min    Short Term Goals: Week 1:  OT Short Term Goal 1 (Week 1): STGs=LTGs due to ELOS  Skilled Therapeutic Interventions/Progress Updates:    Pt greeted at time of session sitting up in wheelchair finishing up discussion with SW. Resumed session and pt noted to be soiled with urine on pants. No pain or s/s of pain reported throughout session. Ambulated chair <> bathroom and transfer to standard commode CGA with RW. Clothing management CGA as well, pt standing to doff over feet despite cues for safety. Max verbal and visual cues for pt to complete pericare for thorough cleaning as he cleaned his face and legs prior to washing periarea. Donned socks Set up, new pants with CGA and extended time. Note mutlimodal cues needed throughout and pt with nonsensical verbal communication. Ambulated bathroom > sink > recliner CGA with RW and stood for hand washing Supervision. Up in recliner alarm on call bell in reach.   Therapy Documentation Precautions:  Precautions Precautions: Fall Precaution Comments: profound cognitive deficits and impaired communication; seizures Restrictions Weight Bearing Restrictions: No     Therapy/Group: Individual Therapy  Erasmo Score 03/13/2021, 7:20 AM

## 2021-03-13 NOTE — Progress Notes (Signed)
Physical Therapy Session Note  Patient Details  Name: Arthur [name] MRN: [id number] Date of Birth: [birth year]  Today's Date: 03/13/2021 PT Individual Time: 1330-1355 PT Individual Time Calculation (min): 25 min   Short Term Goals: Week 1:  PT Short Term Goal 1 (Week 1): STG=LTG due to LOS  Skilled Therapeutic Interventions/Progress Updates:    Patient received sitting up in bed, agreeable to PT. He denies pain. Patient often speaking to PT throughout session, but words were unintelligible/mumbled. After having patient repeat himself multiple times, PT able to understand patient better. He was able to come sit edge of bed with supervision. Ambulating to dayroom with RW and CGA. Very slow gait speed maintained and rigid trunk. NuStep x6 mins at >40 steps/min maintained. Patient ambulating additional ~116ft with RW and emphasis on improved gait speed with noted some carryover from nustep task. Once back in bed, patient with multiple clarifying questions regarding his schedule. He became slightly frustrated that next therapy wasn't starting right after this session had concluded. PT re-explaining schedule and when next therapist should arrive. Patient remaining up in bed, bed alarm on, call light within reach.   Therapy Documentation Precautions:  Precautions Precautions: Fall Precaution Comments: profound cognitive deficits and impaired communication; seizures Restrictions Weight Bearing Restrictions: No     Therapy/Group: Individual Therapy  Elizebeth Koller, PT, DPT, CBIS  03/13/2021, 7:41 AM

## 2021-03-13 NOTE — Progress Notes (Signed)
Physical Therapy Session Note  Patient Details  Name: Arthur [name] MRN: [id number] Date of Birth: 07/25/1960  Today's Date: 03/13/2021 PT Individual Time: 1512-1600 PT Individual Time Calculation (min): 48 min   Short Term Goals: Week 1:  PT Short Term Goal 1 (Week 1): STG=LTG due to LOS  Skilled Therapeutic Interventions/Progress Updates:  Patient supine in bed on entrance to room. Patient alert and agreeable to PT session.   Patient with no pain complaint throughout session.  Therapeutic Activity: Bed Mobility: Patient performed supine <> sit with Mod I. VC/ tc only required for  maintaining safety precautions of remaining in bed until alarm is off and waiting for therapist to don gait belt. Transfers: Patient performed sit<>stand and stand pivot transfers throughout session with CGA. Provided verbal cues for safe hand progression.  Gait Training:  Patient ambulated 175 ft using RW with CGA. Demonstrated intermittent changes in step length with intermittent slow progression of RLE while shifting balance forward over LLE. These changes are minimal. Provided vc/ tc for upright posture and level gaze.   Neuromuscular Re-ed: NMR facilitated during session with focus on standing balance, following verbal instructions, motor control/ planning and coordination. Attempted forward lunges into 6" step with verbal instructions and visual demonstration, however pt attempts to climb steps throughout attempts. Adjusted to forward lunge performance onto 8" curb step and without the visual cue of actual steps, pt is able to follow vc and visual demo with side-by-side performance.Does not follow instructions for R/ L 100% of the time and with backward step off of step, posterior LOB noted intermittently with pt unable to self correct requiring MinA to correct. Pt guided in standing kayak rows using 2# weight bar x15 each side. Then progressed into static balance challenge against max perturbations with pt  holding bar still against PT provided max movements in all directions as well as quick releases with good resistance and appropriate proactive/ reactive balance strategies. Progressed to using rebounder for ball return/ catch for with pt performing well but attempting to bounce ball up through theraball holder despite cues and instructions only to bounce back to him along with visual demonstration prior to initiation of performance. Maintains balance throughout. NMR performed for improvements in motor control and coordination, balance, sequencing, judgement, and self confidence/ efficacy in performing all aspects of mobility at highest level of independence.   Patient supine  in bed at end of session with brakes locked, bed alarm set, and all needs within reach.  Therapy Documentation Precautions:  Precautions Precautions: Fall Precaution Comments: profound cognitive deficits and impaired communication; seizures Restrictions Weight Bearing Restrictions: No General:   Pain:  No pain complaint this session.  Therapy/Group: Individual Therapy  Loel Dubonnet PT, DPT 03/13/2021, 12:25 PM

## 2021-03-13 NOTE — Progress Notes (Signed)
Occupational Therapy Session Note  Patient Details  Name: Arthur [name] MRN: [id number] Date of Birth: 08-11-1960  Today's Date: 03/13/2021 OT Individual Time: 1255-1330 OT Individual Time Calculation (min): 35 min    Short Term Goals: Week 1:  OT Short Term Goal 1 (Week 1): STGs=LTGs due to ELOS  Skilled Therapeutic Interventions/Progress Updates:    Patient in bed, alert, unable to state date.  Is able to state name and birthdate but needs cues for which one I am asking for.  He denies pain.  Bed mobility with CS.  Sit to stand and ambulation on unit with and without AD CGA.    Completed standing activities with BITS system - occ fatigue with standing requiring rest break, no LOB - attempted saccades with numbers but unable to sequence, attempted recall of 3 words presents visual and verbal (approx 25 % accuracy) Reaction time with dots - 1st attempt: right hand only = 2.26sec, 2nd attempts with both hands = 1.89sec.   Ambulated back to room with cues for location of room.  Brief rest in bed before next session, hand off to PT.    Therapy Documentation Precautions:  Precautions Precautions: Fall Precaution Comments: profound cognitive deficits and impaired communication; seizures Restrictions Weight Bearing Restrictions: No   Therapy/Group: Individual Therapy  Barrie Lyme 03/13/2021, 7:48 AM

## 2021-03-13 NOTE — Progress Notes (Signed)
A & O to self. One Attempt OOB without assistance tonight. Voided again in water bottle, although urinal is on EOB, within reach. Does not use call light for assistance. Large BM on previous shift. Arthur [name] A

## 2021-03-13 NOTE — Progress Notes (Signed)
Physical Therapy Session Note  Patient Details  Name: Arthur [name] MRN: [id number] Date of Birth: 07-23-1960  Today's Date: 03/13/2021 PT Individual Time: 0900-0953 PT Individual Time Calculation (min): 53 min   Short Term Goals: Week 1:  PT Short Term Goal 1 (Week 1): STG=LTG due to LOS  Skilled Therapeutic Interventions/Progress Updates:    Pt sitting up in bed on arrival, stating he was looking for a phone. Therapist assisted in looking, but could not find a phone in sheets. Sit to stand from various surfaces with CGA-supervision.   Pt performed the following exercises to promote LE strength and endurance: nustep 2 x 6 min at lvl 4, 50 spm. Pt reported RPE=11.   Gait training throughout session, up to 300 ft with CGA. VC for increased step length and speed, min improvement. Side steps 2 x 12 ft with BIL UE support. Pt had difficulty with motor planning and coordinating side stepping and required max multimodal cueing.   NMR: Pt performed step taps x 2 min on 6" step with BUE support. Pt required max multimodal cues to coordinate alternating step taps. After seated rest break, pt directed in double step taps. Pt had less difficulty with this task, but then required mod cueing with fatigue.  Pt returned to room after session and returned to bed in same manner as above. Pt was left with all needs in reach and alarm active.     Therapy Documentation Precautions:  Precautions Precautions: Fall Precaution Comments: profound cognitive deficits and impaired communication; seizures Restrictions Weight Bearing Restrictions: No    Therapy/Group: Individual Therapy  Juluis Rainier 03/13/2021, 9:59 AM

## 2021-03-13 NOTE — Progress Notes (Signed)
Inpatient Rehabilitation  Patient information reviewed and entered into eRehab system by Polly Barner M. Layton Naves, M.A., CCC/SLP, PPS Coordinator.  Information including medical coding, functional ability and quality indicators will be reviewed and updated through discharge.    

## 2021-03-13 NOTE — Progress Notes (Signed)
Nutrition Brief Note  Patient identified on the Malnutrition Screening Tool (MST) Report  Wt Readings from Last 15 Encounters:  03/11/21 67.8 kg  03/11/21 66.4 kg  03/02/21 84.8 kg  01/18/21 68.5 kg  11/22/20 62.7 kg  10/25/20 62.4 kg  01/11/20 63.1 kg  11/19/19 60 kg  10/08/19 67.6 kg  10/02/19 67.6 kg  12/26/12 66.7 kg   Current diet order is heart healthy/carbohydrate modified, patient is consuming approximately 85-100% of meals at this time. Pt reports having a good appetite currently and PTA with no difficulties. Labs and medications reviewed.   No nutrition interventions warranted at this time. If nutrition issues arise, please consult RD.   Roslyn Smiling, MS, RD, LDN RD pager number/after hours weekend pager number on Amion.

## 2021-03-13 NOTE — Care Management (Signed)
Inpatient Rehabilitation Center Individual Statement of Services  Patient Name:  Arthur [name]  Date:  03/13/2021  Welcome to the Inpatient Rehabilitation Center.  Our goal is to provide you with an individualized program based on your diagnosis and situation, designed to meet your specific needs.  With this comprehensive rehabilitation program, you will be expected to participate in at least 3 hours of rehabilitation therapies Monday-Friday, with modified therapy programming on the weekends.  Your rehabilitation program will include the following services:  Physical Therapy (PT), Occupational Therapy (OT), 24 hour per day rehabilitation nursing, Therapeutic Recreaction (TR), Psychology, Care Coordinator, Rehabilitation Medicine, Nutrition Services, Pharmacy Services, and Other  Weekly team conferences will be held on Tuesdays to discuss your progress.  Your Inpatient Rehabilitation Care Coordinator will talk with you frequently to get your input and to update you on team discussions.  Team conferences with you and your family in attendance may also be held.  Expected length of stay: 7-10 days    Overall anticipated outcome: Supervision  Depending on your progress and recovery, your program may change. Your Inpatient Rehabilitation Care Coordinator will coordinate services and will keep you informed of any changes. Your Inpatient Rehabilitation Care Coordinator's name and contact numbers are listed  below.  The following services may also be recommended but are not provided by the Inpatient Rehabilitation Center:  Driving Evaluations Home Health Rehabiltiation Services Outpatient Rehabilitation Services Vocational Rehabilitation   Arrangements will be made to provide these services after discharge if needed.  Arrangements include referral to agencies that provide these services.  Your insurance has been verified to be:  Medicaid of [place]  Your primary doctor is:  [facility]  Pertinent  information will be shared with your doctor and your insurance company.  Inpatient Rehabilitation Care Coordinator:  Susie Cassette [phone] or ([phone]  Information discussed with and copy given to patient by: Gretchen Short, 03/13/2021, 8:44 AM

## 2021-03-14 DIAGNOSIS — G40909 Epilepsy, unspecified, not intractable, without status epilepticus: Secondary | ICD-10-CM

## 2021-03-14 DIAGNOSIS — R4701 Aphasia: Secondary | ICD-10-CM

## 2021-03-14 DIAGNOSIS — I10 Essential (primary) hypertension: Secondary | ICD-10-CM | POA: Diagnosis not present

## 2021-03-14 DIAGNOSIS — G9340 Encephalopathy, unspecified: Secondary | ICD-10-CM | POA: Diagnosis not present

## 2021-03-14 LAB — GLUCOSE, CAPILLARY
Glucose-Capillary: 97 mg/dL (ref 70–99)
Glucose-Capillary: 111 mg/dL — ABNORMAL HIGH (ref 70–99)
Glucose-Capillary: 91 mg/dL (ref 70–99)

## 2021-03-14 MED ORDER — CLONIDINE HCL 0.1 MG PO TABS
0.1000 mg | ORAL_TABLET | Freq: Three times a day (TID) | ORAL | Status: DC
  Administered 2021-03-14 – 2021-03-22 (×25): 0.1 mg via ORAL
  Filled 2021-03-14 (×26): qty 1

## 2021-03-14 NOTE — Progress Notes (Signed)
Inpatient Rehabilitation Care Coordinator Assessment and Plan Patient Details  Name: Arthur [name] MRN: [id number] Date of Birth: Mar 02, 1961  Today's Date: 03/14/2021  Hospital Problems: Principal Problem:   Encephalopathy acute Active Problems:   Controlled type 2 diabetes mellitus with hyperglycemia, without long-term current use of insulin ([place])  Past Medical History:  Past Medical History:  Diagnosis Date   DM2 (diabetes mellitus, type 2) ([place])    ETOH abuse    Hypertension    Seizures ([place])    Past Surgical History:  Past Surgical History:  Procedure Laterality Date   HEMORRHOID SURGERY     Social History:  reports that he has been smoking cigars. He has never used smokeless tobacco. He reports that he does not currently use alcohol. He reports that he does not use drugs.  Family / Support Systems Marital Status: Married How Long?: 7 years Patient Roles: Spouse, Parent Spouse/Significant Other: Radene Ou (wife): [phone] Children: Blended family. Pt has a total of 6 children. Pt has 4 children from a previous relationship. Pt wife's 36 y.o, special needs son lives in the home. Other Supports: None reported Anticipated Caregiver: Wife Ability/Limitations of Caregiver: Pt wife cares for their 58 y.o. special needs son. Caregiver Availability: 24/7 Family Dynamics: Pt lives with wife and 58 y.o. son  Social History Preferred language: English Religion: Non-Denominational Cultural Background: Pt worked in [place] a few months ago. Education: some Medical sales representative - How often do you need to have someone help you when you read instructions, pamphlets, or other written material from your doctor or pharmacy?: Rarely Writes: Yes Employment Status: Disabled Date Retired/Disabled/Unemployed: Wife reports pt has not worked in months. Pt was denied SSDI and currently appeal is pending. Legal History/Current Legal Issues: Denies Guardian/Conservator: N/A    Abuse/Neglect Abuse/Neglect Assessment Can Be Completed: Unable to assess, patient is non-responsive or altered mental status (Pt appeared confused at times in which unable to comprehend some of SWs questions and blurting out random statements)  Patient response to: Social Isolation - How often do you feel lonely or isolated from those around you?: Never  Emotional Status Pt's affect, behavior and adjustment status: Pt appeared confused at times in which unable to comprehend some of SWs questions and blurting out random statements Recent Psychosocial Issues: Denies Psychiatric History: Denies Substance Abuse History: pt admits to smoking cigarettes atleast 1pk lastsa wk.  Patient / Family Perceptions, Expectations & Goals Pt/Family understanding of illness & functional limitations: Pt wife has a general understanding of his care needs Premorbid pt/family roles/activities: Independnet Anticipated changes in roles/activities/participation: Assistance with ADLs/IADls Pt/family expectations/goals: Pt was unable to answer this question. Pt became confused and was blurting out random statements.  Community Resources [employer]: None Premorbid Home Care/DME Agencies: None Transportation available at discharge: Wife Is the patient able to respond to transportation needs?: No In the past 12 months, has lack of transportation kept you from medical appointments or from getting medications?: No In the past 12 months, has lack of transportation kept you from meetings, work, or from getting things needed for daily living?: No Resource referrals recommended: Neuropsychology  Discharge Planning Living Arrangements: Spouse/significant other, Children Support Systems: Spouse/significant other, Children Type of Residence: Private residence Insurance Resources: [employer] (specify county[place] ([place]) Museum/gallery curator Resources: Family Support Financial Screen Referred: No Living Expenses:  Education officer, community Management: Spouse Does the patient have any problems obtaining your medications?: No Home Management: Both managed home care needs Patient/Family Preliminary Plans: Wife to assume role Care Coordinator Barriers  to Discharge: Decreased caregiver support, Lack of/limited family support, Insurance for SNF coverage Care Coordinator Anticipated Follow Up Needs: HH/OP Expected length of stay: 7-10 days  Clinical Impression Sw met with pt in room to introduce self, explain role, and discuss discharge process. Pt is not a English as a second language teacher. No HCPOA. DME: RW.   1128- SW spoke with pt wife [name] to introduce self, explain role, and discuss discharge process. She admits she has observed confusion with patient as well ,and would like to know what is causing this. Sw informed will pass along to medical team. She is aware SW will follow-up after team conference with updates.   Lynnley Doddridge A Bert Ptacek 03/14/2021, 9:54 AM

## 2021-03-14 NOTE — IPOC Note (Signed)
Overall Plan of Care [facility]) Patient Details Name: Arthur [name] MRN: [id number] DOB: [birth year]  Admitting Diagnosis: Encephalopathy acute  Hospital Problems: Principal Problem:   Encephalopathy acute Active Problems:   Controlled type 2 diabetes mellitus with hyperglycemia, without long-term current use of insulin (HCC)     Functional Problem List: Nursing Bladder, Medication Management, Endurance, Safety, Sensory  PT Balance, Behavior, Endurance, Motor, Nutrition, Perception, Safety  OT Balance, Behavior, Cognition, Vision, Endurance, Hospital doctor, Perception  SLP    TR         Basic ADL's: OT Grooming, Bathing, Dressing, Toileting     Advanced  ADL's: OT Simple Meal Preparation     Transfers: PT Bed Mobility, Bed to Chair, Car, Occupational psychologist, Research scientist (life sciences): PT Ambulation, Psychologist, prison and probation services, Stairs     Additional Impairments: OT None  SLP        TR      Anticipated Outcomes Item Anticipated Outcome  Self Feeding No goal  Swallowing      Basic self-care  Marketing executive Transfers Supervision  Bowel/Bladder  supervision  Transfers  supervision with LRAD  Locomotion  supervision with LRAD  Communication     Cognition     Pain  < 2 on faces pain scale  Safety/Judgment  min. assist with reminders, no falls   Therapy Plan: PT Intensity: Minimum of 1-2 x/day ,45 to 90 minutes PT Frequency: 5 out of 7 days, Total of 15 hours over 7 days of combined therapies PT Duration Estimated Length of Stay: 7-10 days OT Intensity: Minimum of 1-2 x/day, 45 to 90 minutes OT Frequency: 5 out of 7 days OT Duration/Estimated Length of Stay: 7-10 days     Due to the current state of emergency, patients may not be receiving their 3-hours of Medicare-mandated therapy.   Team Interventions: Nursing Interventions Patient/Family Education, Bladder Management, Disease Management/Prevention, Pain Management,  Medication Management, Discharge Planning  PT interventions Ambulation/gait training, Discharge planning, Functional mobility training, Psychosocial support, Therapeutic Activities, Visual/perceptual remediation/compensation, Balance/vestibular training, Disease management/prevention, Neuromuscular re-education, Skin care/wound management, Therapeutic Exercise, Wheelchair propulsion/positioning, Cognitive remediation/compensation, DME/adaptive equipment instruction, Pain management, Splinting/orthotics, UE/LE Strength taining/ROM, Community reintegration, Equities trader education, Museum/gallery curator, UE/LE Coordination activities  OT Interventions Warden/ranger, Disease mangement/prevention, Neuromuscular re-education, Self Care/advanced ADL retraining, Therapeutic Exercise, Wheelchair propulsion/positioning, UE/LE Strength taining/ROM, Pain management, DME/adaptive equipment instruction, Cognitive remediation/compensation, Firefighter, Equities trader education, UE/LE Coordination activities, Visual/perceptual remediation/compensation, Therapeutic Activities, Psychosocial support, Functional mobility training, Discharge planning  SLP Interventions    TR Interventions    SW/CM Interventions Discharge Planning, Psychosocial Support, Patient/Family Education   Barriers to Discharge MD  Medical stability  Nursing Incontinence, Decreased caregiver support, Home environment access/layout, Lack of/limited family support, Medication compliance    PT Inaccessible home environment, Decreased caregiver support, Home environment access/layout, Behavior, Other (comments) cognitive impairements, difficulty communicating, decreased safety awareness, decreased balance/postural control, unsure how much assist wife is able to provide.  OT Behavior pts impulsivity and impaired awareness of deficits  SLP      SW Decreased caregiver support, Lack of/limited family support, Community education officer for SNF  coverage     Team Discharge Planning: Destination: PT-Home ,OT- Home , SLP-  Projected Follow-up: PT-Home health PT, OT-  Home health OT, SLP-  Projected Equipment Needs: PT-To be determined, OT- To be determined, SLP-  Equipment Details: PT- , OT-  Patient/family involved in discharge planning: PT- Patient,  OT-Patient unable/family or caregiver not available, SLP-  MD ELOS: 7-10 days Medical Rehab Prognosis:  Excellent Assessment: The patient has been admitted for CIR therapies with the diagnosis of encephalopathy d/t complicated seizure disorder with resulting aphasia and cognitive deficits. The team will be addressing functional mobility, strength, stamina, balance, safety, adaptive techniques and equipment, self-care, bowel and bladder mgt, patient and caregiver education, NMR, cognition, communication, language, behavior. Goals have been set at supervision with self-care and mobility and supervision to min assist with cognition and language.   Due to the current state of emergency, patients may not be receiving their 3 hours per day of Medicare-mandated therapy.    Ranelle Oyster, MD, FAAPMR     See Team Conference Notes for weekly updates to the plan of care

## 2021-03-14 NOTE — Progress Notes (Signed)
A & O to self. Attempts to reorient, unsuccessful. 2 attempts OOB without assistance. Telesitter in place. Rambling conversation. "I'm leaving tomorrow." Denies pain. Incontinent of urine x 1 tonight. Alfredo [name] A

## 2021-03-14 NOTE — Progress Notes (Signed)
[place] PHYSICAL MEDICINE & REHABILITATION PROGRESS NOTE  Subjective/Complaints: Pt in bed this morning. Awoke easily and said he was doing ok.  Pt restless and "rambling" per RN last night  ROS: limited by language  Objective: Vital Signs: Blood pressure (!) 176/95, pulse 86, temperature 97.8 F (36.6 C), resp. rate 20, height 5\' 3"  (1.6 m), weight 67.8 kg, SpO2 100 %. No results found. Recent Labs    03/13/21 0520  WBC 5.4  HGB 13.0  HCT 39.1  PLT 349   Recent Labs    03/11/21 1421  CREATININE 1.04    Intake/Output Summary (Last 24 hours) at 03/14/2021 0944 Last data filed at 03/14/2021 03/16/2021 Gross per 24 hour  Intake 960 ml  Output 900 ml  Net 60 ml     Pressure Injury 10/19/20 Face Left;Upper Stage 2 -  Partial thickness loss of dermis presenting as a shallow open injury with a red, pink wound bed without slough. pink circle chaped breakdown from EEG electrodes (Active)  10/19/20 1453  Location: Face  Location Orientation: Left;Upper  Staging: Stage 2 -  Partial thickness loss of dermis presenting as a shallow open injury with a red, pink wound bed without slough.  Wound Description (Comments): pink circle chaped breakdown from EEG electrodes  Present on Admission: No     Pressure Injury 10/19/20 Face Right;Upper Deep Tissue Pressure Injury - Purple or maroon localized area of discolored intact skin or blood-filled blister due to damage of underlying soft tissue from pressure and/or shear. dark discoloration in the shape  (Active)  10/19/20 1456  Location: Face  Location Orientation: Right;Upper  Staging: Deep Tissue Pressure Injury - Purple or maroon localized area of discolored intact skin or blood-filled blister due to damage of underlying soft tissue from pressure and/or shear.  Wound Description (Comments): dark discoloration in the shape of the circle EEG electrode  Present on Admission: No    Physical Exam: BP (!) 176/95 (BP Location: Right Arm)    Pulse 86   Temp 97.8 F (36.6 C)   Resp 20   Ht 5\' 3"  (1.6 m)   Wt 67.8 kg   SpO2 100%   BMI 26.48 kg/m  Constitutional: No distress . Vital signs reviewed. HENT: Normocephalic.  Atraumatic. Eyes: EOMI. No discharge. Cardiovascular: No JVD.  RRR. Respiratory: Normal effort.  No stridor.  Bilateral clear to auscultation. GI: Non-distended.  BS +. Skin: facial wounds healing. Psych: Normal mood.  Slowed.. Musc: No edema in extremities.  No tenderness in extremities. Neuro: Alert and oriented x1. Word finding deficits. Able to name "watch" but failed in naming a few other items such as the TV. Distracted. Sensory exam normal for light touch and pain in all 4 limbs. No limb ataxia or cerebellar signs. No abnormal tone appreciated.   Motor: 4/5 throughout, unchanged  Assessment/Plan: 1. Functional deficits which require 3+ hours per day of interdisciplinary therapy in a comprehensive inpatient rehab setting. Physiatrist is providing close team supervision and 24 hour management of active medical problems listed below. Physiatrist and rehab team continue to assess barriers to discharge/monitor patient progress toward functional and medical goals   Care Tool:  Bathing    Body parts bathed by patient: Right arm, Left arm, Chest, Abdomen, Front perineal area, Buttocks, Right upper leg, Left upper leg, Right lower leg, Left lower leg, Face         Bathing assist Assist Level: Minimal Assistance - Patient > 75%     Upper Body Dressing/Undressing  Upper body dressing   What is the patient wearing?: Pull over shirt    Upper body assist Assist Level: Supervision/Verbal cueing    Lower Body Dressing/Undressing Lower body dressing      What is the patient wearing?: Pants     Lower body assist Assist for lower body dressing: Contact Guard/Touching assist     Toileting Toileting    Toileting assist Assist for toileting: Contact Guard/Touching assist     Transfers Chair/bed  transfer  Transfers assist     Chair/bed transfer assist level: Minimal Assistance - Patient > 75%     Locomotion Ambulation   Ambulation assist      Assist level: Contact Guard/Touching assist Assistive device: No Device Max distance: 300+   Walk 10 feet activity   Assist     Assist level: Contact Guard/Touching assist Assistive device: No Device   Walk 50 feet activity   Assist    Assist level: Contact Guard/Touching assist Assistive device: No Device    Walk 150 feet activity   Assist Walk 150 feet activity did not occur: Safety/medical concerns (fatigue, weakness, decreased balance)  Assist level: Contact Guard/Touching assist Assistive device: No Device    Walk 10 feet on uneven surface  activity   Assist     Assist level: Minimal Assistance - Patient > 75% Assistive device: Other (comment) (handrail)   Wheelchair     Assist Is the patient using a wheelchair?: Yes Type of Wheelchair: Manual    Wheelchair assist level: Supervision/Verbal cueing Max wheelchair distance: 34ft    Wheelchair 50 feet with 2 turns activity    Assist        Assist Level: Maximal Assistance - Patient 25 - 49% (per PT note)   Wheelchair 150 feet activity     Assist      Assist Level: Total Assistance - Patient < 25% (per PT note)  BP (!) 176/95 (BP Location: Right Arm)   Pulse 86   Temp 97.8 F (36.6 C)   Resp 20   Ht 5\' 3"  (1.6 m)   Wt 67.8 kg   SpO2 100%   BMI 26.48 kg/m    Medical Problem List and Plan: 1.  Deficits in mobility, self-care, cognition secondary to seizures (left parietal region) and resulting encephalopathy.  -Continue CIR therapies including PT, OT, and SLP. Interdisciplinary team conference today to discuss goals, barriers to discharge, and dc planning.   2.  DVT prophylaxis/anticoagulation:   Mechanical: Sequential compression devices, below knee Bilateral lower extremities Pharmaceutical: Lovenox  Creatinine  within normal limits on 10/15             -antiplatelet therapy: ASA 81 3. Pain Management: As needed meds             Team support 4.  Mood: Klonopin 1 mg twice daily for anxiety             -antipsychotic agents:N/A 5. Neuropsych: This patient is not capable of making decisions on his own behalf. 6. Skin/Wound Care: Routine skin care 7. Fluids/Electrolytes/Nutrition: Routine I/Os            encourage PO -check CMET tomorrow 10/19 8.  Essential hypertension             Norvasc 10, clonidine 0.1 mg twice daily, Lisinopril 10 mg daily Inadequate control currently, increase clonidine to TID 9.  Seizures Perampanel 4 mg x 5 days, then 2 mg x 10 days Phenytoin 50 mg 3 times daily x5 days  starting on 10/15, then 50 mg twice daily x5 days, then 50 mg daily x5 days Vimpat 200 mg twice daily             Keppra 1500 mg twice daily no breakthrough seizures since admission to rehab 10.  Diabetes mellitus type 2             Metformin 500 mg twice daily PTA             CBG (last 3)  Recent Labs    03/13/21 1129 03/13/21 1608 03/14/21 0644  GLUCAP 109* 138* 97    -reasonable control 11.  Hyponatremia             Sodium 131 on 10/15             CMP ordered for Wednesday  LOS: 3 days A FACE TO FACE EVALUATION WAS PERFORMED  Ranelle Oyster 03/14/2021, 9:44 AM

## 2021-03-14 NOTE — Progress Notes (Signed)
Patient ID: Arthur [name], male   DOB: 1960-10-27, 60 y.o.   MRN: [id number]  SW met with pt and called pt wife [name] while in the room to provide updates from team conference, and d/c date 10/27. Family edu pending as wife has to get someone to sit with her special needs son.   [name], MSW, [place] Office: [phone] Cell: [phone] Fax: [phone]
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
## 2021-03-14 NOTE — Patient Care Conference (Signed)
Inpatient RehabilitationTeam Conference and Plan of Care Update Date: 03/14/2021   Time: 10:52 AM    Patient Name: Arthur [name]      Medical Record Number: [id number]  Date of Birth: 1961-01-21 Sex: Male         Room/Bed: 4W12C/4W12C-02 Payor Info: Payor: MEDICAID [place] / Plan: MEDICAID OF [place] / Product Type: *No Product type* /    Admit Date/Time:  03/11/2021  1:52 PM  Primary Diagnosis:  Encephalopathy acute  Hospital Problems: Principal Problem:   Encephalopathy acute Active Problems:   Controlled type 2 diabetes mellitus with hyperglycemia, without long-term current use of insulin [facility])    Expected Discharge Date: Expected Discharge Date: 03/23/21  Team Members Present: Physician leading conference: Dr. Faith Rogue Social Worker Present: Cecile Sheerer, LCSWA Nurse Present: Kennyth Arnold, RN PT Present: Malachi Pro, PT OT Present: Kearney Hard, OT PPS Coordinator present : Fae Pippin, SLP     Current Status/Progress Goal Weekly Team Focus  Bowel/Bladder   Continent & incontinent of bladder. Taking 2 scheduled senokot daily. LBM 10/16.  Continent of B&B by discharge.  Time toliet to decrease incontinent episodes.   Swallow/Nutrition/ Hydration             ADL's   CGA/Min A overall  supervision  self-care retraining, activity tolerance, balance, transfers, dc planning   Mobility   supervision bed mobility, CGA transfers, CGA short distance gait, minA dynamic and long distance gait with no AD  supervision  safety awareness, global strengthening and endurance training, standing balance, dynamic gait with LRAD, DC planning   Communication             Safety/Cognition/ Behavioral Observations            Pain   denies pain  remain pain free  assess pain q 4hr and prn   Skin   No skin issues at this time  No skin breakdown  Assess skin Q shift. Encourage patient to reposition in bed and wheelchair.     Discharge Planning:  D/c to home with his wife who will  provide 24/7 care.   Team Discussion: Has seizure disorder, multiple seizure history. Restless last night. Tolerating medications. Continent/incontinent B/B, LBM 10/16. No reported pain. Tele-sitter in place.  Patient on target to meet rehab goals: yes, contact guard to min assist overall. Supervision goals. Walking 300 ft. Will need a SLP eval.  *See Care Plan and progress notes for long and short-term goals.   Revisions to Treatment Plan:  Adjustment of medications, SLP eval.  Teaching Needs: Family education, medication management, skin/wound care, diabetes management, safety awareness.  Current Barriers to Discharge: Decreased caregiver support, Home enviroment access/layout, Incontinence, Lack of/limited family support, and Medication compliance  Possible Resolutions to Barriers: Family education, timed toileting to decrease incontinence.     Medical Summary Current Status: seizure disorder with resulting encephalopathy and aphasia. bp borderline, po intake reasonable, skin care  Barriers to Discharge: Medical stability   Possible Resolutions to Barriers/Weekly Focus: daily assessment of seizure status and meds, pt data   Continued Need for Acute Rehabilitation Level of Care: The patient requires daily medical management by a physician with specialized training in physical medicine and rehabilitation for the following reasons: Direction of a multidisciplinary physical rehabilitation program to maximize functional independence : Yes Medical management of patient stability for increased activity during participation in an intensive rehabilitation regime.: Yes Analysis of laboratory values and/or radiology reports with any subsequent need for medication adjustment and/or medical  intervention. : Yes   I attest that I was present, lead the team conference, and concur with the assessment and plan of the team.   Tennis Must 03/14/2021, 3:28 PM

## 2021-03-14 NOTE — Progress Notes (Signed)
Occupational Therapy Session Note  Patient Details  Name: Arthur [name] MRN: [id number] Date of Birth: Oct 01, 1960  Today's Date: 03/14/2021 OT Individual Time: 1300-1400 OT Individual Time Calculation (min): 60 min    Short Term Goals: Week 1:  OT Short Term Goal 1 (Week 1): STGs=LTGs due to ELOS  Skilled Therapeutic Interventions/Progress Updates:   Pt received in bed with no c/o pain and agreeable to OT session focusing on BADL reeducation and therapeutic activities. Pt with inconsistent responses to orientation questions. Oriented to self and to hospital. Unable to remember OTS's name even with choice of two. Pt reports it is 1978 when asked the year and states that it is June, and 4th of July was the holiday coming up. Pt able to correctly identify his location as [facility] with increased time given for processing. Nonsensical verbal communication throughout tx.   ADL: Skilled interventions include: Pt completes bed mobility MOD I and requests to use bathroom. Requires cuing for safety EOB as pt attempting to doff socks. Pt denies wanting to take a shower, but doffs shirt EOB. Transitions to bathroom with CGA and RW. MAX cuing needed for directions to sit on commode and to pull pants over hips. Pt completes toileting (void of bladder/bowel, charted in flowsheets) with supervision. Difficulty following 1-step commands and OTS repeated instructions multiple times to transition into shower. Pt rambled with nonsensical speech standing in shower, perseverating on turning water on while halfway dressed. Increased time needed to complete all tasks. Pt had difficulty following commands, and attempted to stand to doff pants with MAX multimodal cuing required for redirection. Showering activities completed mostly in standing, despite multiple attempts to cue pt to sit for safety. Posterior bias noted sitting and standing - pt needed increased cuing to scoot back on TTB when sitting to wash hair.  Requests to put on lotion after shower and additional cues for sequencing as pt attempts to use deodorant as lotion. Pt completes dressing with overall setup, standing to pull pants over hips. Pt stood for oral care (small shaking of R LE noted). Sat in wc for rest break.  Pt completes functional mobility with no AD for cognitive retraining around dayroom back to room, requiring MAX multimodal cuing to stand from wc. Pt unaware of where his feet were and could not follow simple cues to stand. Multiple attempts made as pt ambulated to orient to season (pt replied "December" when asked what season it was with the leaves changing") or month. Pt unable to answer with choice of 2. Overall, pt very polite in response to tx. Pt left at end of session in bed with exit alarm on, 4 bedrails up for seizure precautions, call light in reach and all needs met.    Therapy Documentation Precautions:  Precautions Precautions: Fall Precaution Comments: profound cognitive deficits and impaired communication; seizures Restrictions Weight Bearing Restrictions: No   Therapy/Group: Individual Therapy  Jaedan Huttner 03/14/2021, 3:58 PM

## 2021-03-14 NOTE — Progress Notes (Signed)
Physical Therapy Session Note  Patient Details  Name: Arthur [name] MRN: [id number] Date of Birth: [birth year]  Today's Date: 03/14/2021 PT Individual Time: 1000-1055 PT Individual Time Calculation (min): 55 min   Short Term Goals: Week 1:  PT Short Term Goal 1 (Week 1): STG=LTG due to LOS  Skilled Therapeutic Interventions/Progress Updates:     Pt presents supine in bed with LE's hanging off of bed, asleep but awakens to voice and is agreeable to PT session. No reports of pain. Supine<>sit completed at supervision level. Sit<>stand with CGA and no AD. Ambulates several hundred feet during our session with CGA and no AD. Assisted onto Nustep and completed 8 minutes using BLE and BUE, workload set to 5, working to improve lengthening of stride length and global strengthening - some carryover noted into functional gait from [employer] task. In main rehab gym, instructed on cognitive task at high/low table while standing with supervision for safety - instructed to complete colored push pins diagram while matching color by color to work on focused and sustained attention - pt able to complete with min cues with good ability to recognize errors. Noted LE's beginning to shake after ~10 minutes of standing, contributed to weakness. Next, worked on dynamic standing balance while shooting basketball, requiring minA overall for steadying - able to pick the ball up from standing position with minA for balance. Next, worked on dynamic gait, bouncing the basketball while ambulating, requiring minA for balance during this dynamic gait task. He ambulated ~234ft with CGA and no AD from main rehab gym and around rehab hallways. Required directional cues to locate his room and then assisted back to bed at minA level for assisting LE's into the bed. All needs in reach with bed alarm on and end of session.   Therapy Documentation Precautions:  Precautions Precautions: Fall Precaution Comments: profound cognitive deficits  and impaired communication; seizures Restrictions Weight Bearing Restrictions: No General:    Therapy/Group: Individual Therapy  [name] P [name] 03/14/2021, 7:50 AM

## 2021-03-15 DIAGNOSIS — G9340 Encephalopathy, unspecified: Secondary | ICD-10-CM | POA: Diagnosis not present

## 2021-03-15 DIAGNOSIS — R4701 Aphasia: Secondary | ICD-10-CM | POA: Diagnosis not present

## 2021-03-15 DIAGNOSIS — E871 Hypo-osmolality and hyponatremia: Secondary | ICD-10-CM | POA: Diagnosis not present

## 2021-03-15 DIAGNOSIS — G40909 Epilepsy, unspecified, not intractable, without status epilepticus: Secondary | ICD-10-CM | POA: Diagnosis not present

## 2021-03-15 LAB — COMPREHENSIVE METABOLIC PANEL
ALT: 44 U/L (ref 0–44)
AST: 31 U/L (ref 15–41)
Albumin: 3.1 g/dL — ABNORMAL LOW (ref 3.5–5.0)
Alkaline Phosphatase: 116 U/L (ref 38–126)
Anion gap: 7 (ref 5–15)
BUN: 13 mg/dL (ref 6–20)
CO2: 23 mmol/L (ref 22–32)
Calcium: 8.7 mg/dL — ABNORMAL LOW (ref 8.9–10.3)
Chloride: 98 mmol/L (ref 98–111)
Creatinine, Ser: 1.07 mg/dL (ref 0.61–1.24)
GFR, Estimated: >60 mL/min (ref >60)
Glucose, Bld: 101 mg/dL — ABNORMAL HIGH (ref 70–99)
Potassium: 4.1 mmol/L (ref 3.5–5.1)
Sodium: 128 mmol/L — ABNORMAL LOW (ref 135–145)
Total Bilirubin: 0.2 mg/dL — ABNORMAL LOW (ref 0.3–1.2)
Total Protein: 7 g/dL (ref 6.5–8.1)

## 2021-03-15 LAB — GLUCOSE, CAPILLARY
Glucose-Capillary: 103 mg/dL — ABNORMAL HIGH (ref 70–99)
Glucose-Capillary: 106 mg/dL — ABNORMAL HIGH (ref 70–99)
Glucose-Capillary: 94 mg/dL (ref 70–99)

## 2021-03-15 NOTE — Progress Notes (Signed)
Physical Therapy Session Note  Patient Details  Name: Arthur [name] MRN: [id number] Date of Birth: [birth year]  Today's Date: 03/15/2021 PT Individual Time: 0804-0900 PT Individual Time Calculation (min): 56 min   Short Term Goals: Week 1:  PT Short Term Goal 1 (Week 1): STG=LTG due to LOS  Skilled Therapeutic Interventions/Progress Updates:     Pt received supine in bed and eager for therapy. No complaint of pain. Bed mobility independent. Pt performs sit to stand with verbal cues for sequencing and positioning. WC transport to gym for time management. Pt ambulates 2x150' with close supervision. Pt initially ambulates slowly with short stride lengths and decreased trunk rotation. PT cues for increased stride length to facilitate momentum and improved balance, and increasing gait speed and trunk rotation. Pt demos good incorporation of PT cues. Pt then tasked with performing gait training activity in which he is required to walk on "floor ladder", placing one foot in each rung-space of ladder. Pt requires occasional minA to complete due to losing balance to the L. PT provides cues for upright gaze to improve balance and pt then able to complete with CGA. Pt then tasked with stepping in every other rung-space to encourage increased stride lengths. PT provides cues to increase speed to utilize momentum and pt is able to complete with x1 instance of minA due to loss of balance to the L. Following, PT educates pt on performance and rationale of 6-min walk test. Pt performs with close supervision with cues for navigation and achieves a score of 1175'. Following seated rest break, pt ambulates back to room with close supervision and cues for gait speed and upright gaze. Sit to supine with cues on positioning. Left supine with alarm intact and all needs within reach.  Therapy Documentation Precautions:  Precautions Precautions: Fall Precaution Comments: profound cognitive deficits and impaired  communication; seizures Restrictions Weight Bearing Restrictions: No   Therapy/Group: Individual Therapy  Beau Fanny, PT, DPT 03/15/2021, 4:12 PM

## 2021-03-15 NOTE — Progress Notes (Addendum)
[place] PHYSICAL MEDICINE & REHABILITATION PROGRESS NOTE  Subjective/Complaints: No new issues. Seems to have slept last night. In pleasant mood  ROS: limited by language  Objective: Vital Signs: Blood pressure (!) 126/92, pulse 73, temperature 97.9 F (36.6 C), temperature source Oral, resp. rate 18, height 5\' 3"  (1.6 m), weight 67.8 kg, SpO2 100 %. No results found. Recent Labs    03/13/21 0520  WBC 5.4  HGB 13.0  HCT 39.1  PLT 349   Recent Labs    03/15/21 0539  NA 128*  K 4.1  CL 98  CO2 23  GLUCOSE 101*  BUN 13  CREATININE 1.07  CALCIUM 8.7*    Intake/Output Summary (Last 24 hours) at 03/15/2021 0827 Last data filed at 03/15/2021 0730 Gross per 24 hour  Intake 540 ml  Output 1000 ml  Net -460 ml         Physical Exam: BP (!) 126/92 (BP Location: Left Arm)   Pulse 73   Temp 97.9 F (36.6 C) (Oral)   Resp 18   Ht 5\' 3"  (1.6 m)   Wt 67.8 kg   SpO2 100%   BMI 26.48 kg/m  Constitutional: No distress . Vital signs reviewed. HEENT: NCAT, EOMI, oral membranes moist Neck: supple Cardiovascular: RRR without murmur. No JVD    Respiratory/Chest: CTA Bilaterally without wheezes or rales. Normal effort    GI/Abdomen: BS +, non-tender, non-distended Ext: no clubbing, cyanosis, or edema Psych: pleasant and cooperative Musc: No edema in extremities.  No tenderness in extremities. Neuro: Alert and oriented x1. Ongoing Word finding deficits. Still distracted. Sensory exam normal for light touch and pain in all 4 limbs. No limb ataxia or cerebellar signs. No abnormal tone appreciated.   Motor: 4/5 throughout, stable  Assessment/Plan: 1. Functional deficits which require 3+ hours per day of interdisciplinary therapy in a comprehensive inpatient rehab setting. Physiatrist is providing close team supervision and 24 hour management of active medical problems listed below. Physiatrist and rehab team continue to assess barriers to discharge/monitor patient  progress toward functional and medical goals   Care Tool:  Bathing    Body parts bathed by patient: Right arm, Left arm, Chest, Abdomen, Front perineal area, Buttocks, Right upper leg, Left upper leg, Right lower leg, Left lower leg, Face         Bathing assist Assist Level: Minimal Assistance - Patient > 75%     Upper Body Dressing/Undressing Upper body dressing   What is the patient wearing?: Pull over shirt    Upper body assist Assist Level: Set up assist    Lower Body Dressing/Undressing Lower body dressing      What is the patient wearing?: Pants     Lower body assist Assist for lower body dressing: Contact Guard/Touching assist     Toileting Toileting    Toileting assist Assist for toileting: Contact Guard/Touching assist     Transfers Chair/bed transfer  Transfers assist     Chair/bed transfer assist level: Supervision/Verbal cueing     Locomotion Ambulation   Ambulation assist      Assist level: Contact Guard/Touching assist Assistive device: No Device Max distance: 300+   Walk 10 feet activity   Assist     Assist level: Contact Guard/Touching assist Assistive device: No Device   Walk 50 feet activity   Assist    Assist level: Contact Guard/Touching assist Assistive device: No Device    Walk 150 feet activity   Assist Walk 150 feet activity did not  occur: Safety/medical concerns (fatigue, weakness, decreased balance)  Assist level: Contact Guard/Touching assist Assistive device: No Device    Walk 10 feet on uneven surface  activity   Assist     Assist level: Minimal Assistance - Patient > 75% Assistive device: Other (comment) (handrail)   Wheelchair     Assist Is the patient using a wheelchair?: Yes Type of Wheelchair: Manual    Wheelchair assist level: Supervision/Verbal cueing Max wheelchair distance: 33ft    Wheelchair 50 feet with 2 turns activity    Assist        Assist Level: Maximal  Assistance - Patient 25 - 49% (per PT note)   Wheelchair 150 feet activity     Assist      Assist Level: Total Assistance - Patient < 25% (per PT note)  BP (!) 126/92 (BP Location: Left Arm)   Pulse 73   Temp 97.9 F (36.6 C) (Oral)   Resp 18   Ht 5\' 3"  (1.6 m)   Wt 67.8 kg   SpO2 100%   BMI 26.48 kg/m    Medical Problem List and Plan: 1.  Deficits in mobility, self-care, cognition secondary to seizures (left parietal region) and resulting encephalopathy.  -Continue CIR therapies including PT, OT, and SLP. Interdisciplinary team conference today to discuss goals, barriers to discharge, and dc planning.   2.  DVT prophylaxis/anticoagulation:   Mechanical: Sequential compression devices, below knee Bilateral lower extremities Pharmaceutical: Lovenox  Creatinine within normal limits on 10/15             -antiplatelet therapy: ASA 81 3. Pain Management: As needed meds             Team support 4.  Mood: Klonopin 1 mg twice daily for anxiety             -antipsychotic agents:N/A 5. Neuropsych: This patient is not capable of making decisions on his own behalf. 6. Skin/Wound Care: Routine skin care 7. Fluids/Electrolytes/Nutrition: Routine I/Os            encourage PO -low albumin--protein supp 8.  Essential hypertension             Norvasc 10, clonidine 0.1 mg twice daily, Lisinopril 10 mg daily Observe today with increase of clonidine to TID 9.  Seizures Perampanel 4 mg x 5 days, then 2 mg x 10 days Phenytoin 50 mg 3 times daily x5 days starting on 10/15, then 50 mg twice daily x5 days, then 50 mg daily x5 days Vimpat 200 mg twice daily             Keppra 1500 mg twice daily no breakthrough seizures since admission to rehab 10.  Diabetes mellitus type 2             Metformin 500 mg twice daily PTA             CBG (last 3)  Recent Labs    03/14/21 1141 03/14/21 1641 03/15/21 0604  GLUCAP 111* 91 103*    -reasonable control 10/19 11.  Hyponatremia             Sodium  131 on 10/15--> 128 10/19  -begin sl FR., careful as I/O negative sl  -recheck Na+ tomorrow  -related to numerous sz meds?  LOS: 4 days A FACE TO FACE EVALUATION WAS PERFORMED  11/19 03/15/2021, 8:27 AM

## 2021-03-15 NOTE — Progress Notes (Signed)
Occupational Therapy Session Note  Patient Details  Name: Arthur [name] MRN: [id number] Date of Birth: Apr 07, 1961  Today's Date: 03/15/2021 OT Individual Time: 1500-1600 OT Individual Time Calculation (min): 60 min    Short Term Goals: Week 1:  OT Short Term Goal 1 (Week 1): STGs=LTGs due to ELOS  Skilled Therapeutic Interventions/Progress Updates:    Pt received in bed, asleep but easily awoken. Overall, pt pleasant and agreeable to work with. No c/o pain. Able to articulate more appropriately than previous sessions and did not have nonsensical speech during tx. Still with continued difficulty with command following - able to follow one step commands with OTS repeating once. Pt attempted to eat lotion instead of applying it while standing at sink.   ADL: Pt completes BADL at overall close (S) and MIN directional cuing level. Skilled interventions include:  Pt overall more oriented and verbally appropriate. Answered "stroke" when asked why he was in the hospital. Could recall his name, that he was in the hospital and month/season. Pt completed functional mobility to bathroom with no AD and required MIN directional cuing for sequencing to doff clothing with mild perseveration on turning on shower first. Once in shower, pt able to complete all tasks standing 90% of the time. Pt could not continue bathing while answering questions. OTS removed external distractions, and pt was able to shower with no additional cuing for sequencing. Pt dried off standing, with MIN cuing to sit to dry feet. Mild trembling noted in R LE while donning socks. Completed dressing EOB and donned deodorant with supervision. Pt completes oral care standing at sink with perseveration on rinsing mouth. Trembling in BLE noted in standing with increase posterior bias. OTS redirected pt from rinsing mouth by cuing to put on lotion. Pt picked up lotion, and brought to mouth. OTS redirected with min cuing and pt put lotion on hand with  immediately bringing to mouth. Pt unaware - most of lotion was removed with paper towel.   Pt works on [employer] fast streamer & safety awareness with path finding task. Pt instructed to remember room number and walk down hallway. OTS facilitated orientation to room number and location in hospital. Pt completes functional mobility down hallway with no AD and CGA. Pt states "I don't know" when asked what the fire exit door was. OTS provides education on use of emergency exit and pt verbalizes understanding. Pt unable to recall room number, even with choice of two. Worked on scanning down hallway to read off room numbers. Pt provided with visual cues in room to remember room number. Pt returned to bed with close (S).   Pt left at end of session in bed with exit alarm on, call light in reach, 4 bedrails up for seizure precautions, and all needs met   Therapy Documentation Precautions:  Precautions Precautions: Fall Precaution Comments: profound cognitive deficits and impaired communication; seizures Restrictions Weight Bearing Restrictions: No    Therapy/Group: Individual Therapy  Madylyn Insco 03/15/2021, 4:03 PM

## 2021-03-15 NOTE — Progress Notes (Signed)
Physical Therapy Session Note  Patient Details  Name: Arthur [name] MRN: [id number] Date of Birth: 1960-07-31  Today's Date: 03/15/2021 PT Individual Time: 0945-1010 PT Individual Time Calculation (min): 25 min   Short Term Goals: Week 1:  PT Short Term Goal 1 (Week 1): STG=LTG due to LOS  Skilled Therapeutic Interventions/Progress Updates:     Pt presents supine in bed, sleeping soundly, awakens to voice. Requires some mild convincing to participate due to fatigue. Supine<>sit completed with supervision. Sit<>stand with no AD, CGA provided. Ambulated from his room to day room rehab gym with CGA and no AD - assisted onto Kinetron with CGA and completed x5 minutes at workload 20cm/sec resistance, seated on kinetron, focusing on BLE strengthening. Next, completed dynamic standing balance while participating in Wii balance board game - acting as Conservator, museum/gallery for soccer game. He favors his LLE in standing and has significantly difficulty weight shifting towards his R, even with external facilitation. He also had some difficulty understanding instructions for the video game, even though he verbally reports understanding. Pt ambulated back to his room with CGA and no AD, assisted back to bed at supervision level, all needs within reach.  Therapy Documentation Precautions:  Precautions Precautions: Fall Precaution Comments: profound cognitive deficits and impaired communication; seizures Restrictions Weight Bearing Restrictions: No General:    Therapy/Group: Individual Therapy  Orrin Brigham 03/15/2021, 7:42 AM

## 2021-03-15 NOTE — Evaluation (Signed)
Speech Language Pathology Assessment and Plan  Patient Details  Name: Arthur [name] MRN: [id number] Date of Birth: 04/14/1961  SLP Diagnosis: Aphasia;Cognitive Impairments  Rehab Potential: Good ELOS: 03/23/21    Today's Date: 03/15/2021 SLP Individual Time: 1030-1130 SLP Individual Time Calculation (min): 60 min   Hospital Problem: Principal Problem:   Encephalopathy acute Active Problems:   Controlled type 2 diabetes mellitus with hyperglycemia, without long-term current use of insulin ([place])  Past Medical History:  Past Medical History:  Diagnosis Date   DM2 (diabetes mellitus, type 2) ([place])    ETOH abuse    Hypertension    Seizures ([place])    Past Surgical History:  Past Surgical History:  Procedure Laterality Date   HEMORRHOID SURGERY      Assessment / Plan / Recommendation Clinical Impression 60 year old male with past medical history of diabetes mellitus type 2, EtOH abuse, hypertension, seizures presented to hospital on 02/24/2021 with seizures.  Upon presentation to the ED patient was noted to have tonic-clonic activity of bilateral upper extremities without AMS or loss of consciousness.  He received loperamide with resolution of symptoms.  He later had right facial twitching with AMS and decreasing responsiveness.  He was given IV Ativan with resolution of twitching.  There was no associated bowel/bladder incontinence or tongue biting.  Labs were ordered showing creatinine of 1.20 (around patient's baseline) EKG was unchanged.  Head CT unremarkable for acute intracranial process.  UDS positive for benzodiazepines.  Neurology was consulted and EEG ordered.  EEG showing left parietal slowing consistent with postictal state in the absence of overt evidence of active seizures.  Long-term EEG x3 days was event free.  He was given loading dose of Keppra and Vimpat.  Patient with resulting functional deficits with mobility, self-care, cognition. Patient admitted to [facility] 03/11/21. OT/PT  with reports of cognitive-linguistic deficits and SLP consulted for assessment.   Patient demonstrates moderate-severe cognitive-linguistic deficits. Patient's auditory comprehension is characterized by decreased ability to follow 1-step commands. Patient's verbal expression is characterized by perseveration with decreased word-finding resulting in phonemic paraphasias and neologisms with minimal awareness. Patient's overall reading comprehension appeared intact at the word level with deficits noted during written expression of functional information. Patient also demonstrates moderate cognitive impairments impacts orientation, recall, problem solving and overall awareness which impacts his safety with functional and familiar tasks. Patient would benefit from skilled SLP intervention to maximize his cognitive-linguistic functioning and overall functional independence prior to discharge.     Skilled Therapeutic Interventions          Administered a cognitive-linguistic evaluation, please see above for details. Patient requested to use the bathroom and required Min-Mod verbal cues for safety with task due to impulsivity and decreased ability to problem solve how to flush the commode. Patient left upright in bed with alarm on and all needs within reach.    SLP Assessment  Patient will need skilled [place] [facility] during CIR admission    Recommendations  Oral Care Recommendations: Oral care BID Patient destination: Home Follow up Recommendations: 24 hour supervision/assistance;Outpatient SLP;Home Health SLP Equipment Recommended: None recommended by SLP    SLP Frequency 3 to 5 out of 7 days   SLP Duration  SLP Intensity  SLP Treatment/Interventions 03/23/21  Minumum of 1-2 x/day, 30 to 90 minutes  Cognitive remediation/compensation;Internal/external aids;Speech/Language facilitation;Therapeutic Activities;Environmental controls;Cueing hierarchy;Functional tasks;Patient/family  education    Pain No/Denies Pain   SLP Evaluation Cognition Overall Cognitive Status: Impaired/Different from baseline Arousal/Alertness: Awake/alert Orientation Level: Oriented to  person;Oriented to place;Disoriented to time;Disoriented to situation Memory: Impaired Memory Impairment: Decreased short term memory;Decreased recall of new information Awareness: Impaired Awareness Impairment: Intellectual impairment Problem Solving: Impaired Problem Solving Impairment: Verbal basic;Functional complex Behaviors: Impulsive;Perseveration Safety/Judgment: Impaired  Comprehension Auditory Comprehension Overall Auditory Comprehension: Impaired Yes/No Questions: Impaired Basic Biographical Questions: 76-100% accurate Basic Immediate Environment Questions: 75-100% accurate Commands: Impaired One Step Basic Commands: 25-49% accurate Two Step Basic Commands: 0-24% accurate Multistep Basic Commands: 0-24% accurate Conversation: Simple Visual Recognition/Discrimination Discrimination: Within Function Limits Reading Comprehension Reading Status: Impaired Word level: Within functional limits Sentence Level: Impaired Paragraph Level: Impaired Expression Expression Primary Mode of Expression: Verbal Verbal Expression Overall Verbal Expression: Impaired Initiation: No impairment Automatic Speech: Name;Social Response Level of Generative/Spontaneous Verbalization: Phrase Repetition: Impaired Level of Impairment: Phrase level Naming: Impairment Confrontation: Impaired Verbal Errors: Neologisms;Phonemic paraphasias Written Expression Dominant Hand: Right Written Expression: Exceptions to [facility] Oral Motor Oral Motor/Sensory Function Overall Oral Motor/Sensory Function: Within functional limits Motor Speech Overall Motor Speech: Appears within functional limits for tasks assessed  Care Tool Care Tool Cognition Ability to hear (with hearing aid or hearing appliances if normally used  Ability to hear (with hearing aid or hearing appliances if normally used): 0. Adequate - no difficulty in normal conservation, social interaction, listening to TV   Expression of Ideas and Wants Expression of Ideas and Wants: 2. Frequent difficulty - frequently exhibits difficulty with expressing needs and ideas   Understanding Verbal and Non-Verbal Content Understanding Verbal and Non-Verbal Content: 2. Sometimes understands - understands only basic conversations or simple, direct phrases. Frequently requires cues to understand  Memory/Recall Ability Memory/Recall Ability : That he or she is in a hospital/hospital unit    Short Term Goals: Week 1: SLP Short Term Goal 1 (Week 1): STGs=LTGs due to ELOS  Refer to Care Plan for Long Term Goals  Recommendations for other services: None   Discharge Criteria: Patient will be discharged from SLP if patient refuses treatment 3 consecutive times without medical reason, if treatment goals not met, if there is a change in medical status, if patient makes no progress towards goals or if patient is discharged from hospital.  The above assessment, treatment plan, treatment alternatives and goals were discussed and mutually agreed upon: by patient  Shamya Macfadden 03/15/2021, 3:35 PM

## 2021-03-15 NOTE — Clinical Note (Incomplete)
Patient is resting oriented to self, pleasant, knows is name and birthday month, sone delay in remembering his birthday day and years, good eye contact, or recalling what happened in therapy, or what he had done throughout the prior shift.Noted patient pointing and communication with someone that he states was his cousin sitting in the chair, in his room, redirected and some rambling in conversation continue, delay in responding to direct questions when asked, still good eye contact noted.No active seizures or acute distress verbalized or noted.

## 2021-03-16 LAB — BASIC METABOLIC PANEL
Anion gap: 7 (ref 5–15)
BUN: 14 mg/dL (ref 6–20)
CO2: 25 mmol/L (ref 22–32)
Calcium: 9 mg/dL (ref 8.9–10.3)
Chloride: 104 mmol/L (ref 98–111)
Creatinine, Ser: 1.19 mg/dL (ref 0.61–1.24)
GFR, Estimated: >60 mL/min (ref >60)
Glucose, Bld: 84 mg/dL (ref 70–99)
Potassium: 4.4 mmol/L (ref 3.5–5.1)
Sodium: 136 mmol/L (ref 135–145)

## 2021-03-16 LAB — GLUCOSE, CAPILLARY
Glucose-Capillary: 74 mg/dL (ref 70–99)
Glucose-Capillary: 126 mg/dL — ABNORMAL HIGH (ref 70–99)

## 2021-03-16 MED ORDER — LISINOPRIL 20 MG PO TABS
20.0000 mg | ORAL_TABLET | Freq: Every day | ORAL | Status: DC
  Administered 2021-03-17: 20 mg via ORAL
  Filled 2021-03-16: qty 1

## 2021-03-16 MED ORDER — LISINOPRIL 10 MG PO TABS
10.0000 mg | ORAL_TABLET | Freq: Once | ORAL | Status: AC
  Administered 2021-03-16: 10 mg via ORAL
  Filled 2021-03-16: qty 1

## 2021-03-16 MED ORDER — PNEUMOCOCCAL VAC POLYVALENT 25 MCG/0.5ML IJ INJ
0.5000 mL | INJECTION | INTRAMUSCULAR | Status: AC
  Administered 2021-03-16: 0.5 mL via INTRAMUSCULAR
  Filled 2021-03-16: qty 0.5

## 2021-03-16 MED ORDER — INFLUENZA VAC SPLIT QUAD 0.5 ML IM SUSY
0.5000 mL | PREFILLED_SYRINGE | INTRAMUSCULAR | Status: AC
  Administered 2021-03-16: 0.5 mL via INTRAMUSCULAR
  Filled 2021-03-16: qty 0.5

## 2021-03-16 NOTE — Progress Notes (Signed)
Physical Therapy Session Note  Patient Details  Name: Arthur [name] MRN: [id number] Date of Birth: August 21, 1960  Today's Date: 03/16/2021 PT Individual Time: 6568-1275 PT Individual Time Calculation (min): 68 min   Short Term Goals: Week 1:  PT Short Term Goal 1 (Week 1): STG=LTG due to LOS  Skilled Therapeutic Interventions/Progress Updates:     Pt received supine in bed and agrees to therapy. No complaint of pain. Pt performs supine to sit independently. Sit to stand with verbal cues for initiation. Pt ambulates to toilet with CGA. Pt is continent of urine and ambulates back to sink with cue to perform ADL in standing for balance training. Pt ambulates to gym with close supervision and cues for upright gaze to improve posture and balance. Pt performs BERG balance test, as detailed below. Pt requires multiple cues for each task, with difficulty understanding, likely associated with aphasia, and performing better when PT demos tasks rather than explaining them. Following, pt ambulates to dayroom with close supervision. Pt performs Litegait training over treadmill for gait training with body weight support. Pt requires CGA and verbal cues to step up onto treadmill. With bilateral upper extremity support, pt completes following trial on treadmill: 3:00 for 295' at 0.8 to 1.5 mph. Pt has extremely short stride lengths and does not substantially lengthen strides, despite multiple PT cues. During seated rest break, PT demonstrates pt's gait pattern and then demos desired gait pattern with longer strides. Pt performs additional bout and has MUCH improved gait pattern and stride lengths. 5:00 at 1.6 mph for 600'. Following, pt reports that he feels "good but a little dizzy." Pt assesses BP with reading of 166/113. BP then taken in opposite arm with reading of 158/112. Pt taken to room via WC due to hypertension and returns to supine with verbal cues for sequencing. RN notified of BP. Pt left supine with alarm  intact and all needs within reach.  Therapy Documentation Precautions:  Precautions Precautions: Fall Precaution Comments: profound cognitive deficits and impaired communication; seizures Restrictions Weight Bearing Restrictions: No Balance: Balance Balance Assessed: Yes Standardized Balance Assessment Standardized Balance Assessment: Berg Balance Test Berg Balance Test Sit to Stand: Able to stand without using hands and stabilize independently Standing Unsupported: Able to stand safely 2 minutes Sitting with Back Unsupported but Feet Supported on Floor or Stool: Able to sit safely and securely 2 minutes Stand to Sit: Sits safely with minimal use of hands Transfers: Able to transfer safely, minor use of hands Standing Unsupported with Eyes Closed: Able to stand 10 seconds with supervision Standing Ubsupported with Feet Together: Able to place feet together independently and stand for 1 minute with supervision From Standing, Reach Forward with Outstretched Arm: Reaches forward but needs supervision From Standing Position, Pick up Object from Floor: Able to pick up shoe, needs supervision From Standing Position, Turn to Look Behind Over each Shoulder: Turn sideways only but maintains balance Turn 360 Degrees: Needs close supervision or verbal cueing Standing Unsupported, Alternately Place Feet on Step/Stool: Needs assistance to keep from falling or unable to try Standing Unsupported, One Foot in Front: Loses balance while stepping or standing Standing on One Leg: Able to lift leg independently and hold equal to or more than 3 seconds Total Score: 35   Therapy/Group: Individual Therapy  Beau Fanny, PT, DPT 03/16/2021, 4:15 PM

## 2021-03-16 NOTE — Progress Notes (Signed)
Speech Language Pathology Daily Session Note  Patient Details  Name: Arthur [name] MRN: [id number] Date of Birth: 1960/12/15  Today's Date: 03/16/2021 SLP Individual Time: 0915-0955 SLP Individual Time Calculation (min): 40 min  Short Term Goals: Week 1: SLP Short Term Goal 1 (Week 1): STGs=LTGs due to ELOS  Skilled Therapeutic Interventions: Skilled treatment session focused on communication goals. Patient followed one, two and 3 step auditory directions with 66% accuracy. With extra time and Mod A verbal cues, patient able to demonstrate emergent awareness of errors but difficulty self-correcting. Accuracy did not improve when directions were written down. However, accuracy did improve significantly with written cues during complex yes/no questions from 40% accuracy to 100% accuracy. Patient also named functional items with 80% accuracy despite multimodal cues. Severe deficits in repetition are also noted. SLP provided patient with a calendar to maximize orientation to date in which he used independently but then demonstrated difficulty verbalizing the correct year despite seeing the year on the calendar. Accuracy improved when the date was written out. Patient left upright in bed with alarm on and all needs within reach. Continue with current plan of care.       Pain No/Denies Pain   Therapy/Group: Individual Therapy  Oleta Gunnoe 03/16/2021, 10:27 AM

## 2021-03-16 NOTE — Progress Notes (Signed)
Occupational Therapy Session Note  Patient Details  Name: Arthur [name] MRN: [id number] Date of Birth: [birth year]  Today's Date: 03/16/2021 OT Individual Time: 2035-5974 OT Individual Time Calculation (min): 72 min    Short Term Goals: Week 1:  OT Short Term Goal 1 (Week 1): STGs=LTGs due to ELOS  Skilled Therapeutic Interventions/Progress Updates:    Pt greeted semi-reclined in bed and agreeable to OT treatment session. Pt declined need to shower or go to the bathroom. Pt performed functional ambulation in room to wc without AD and CGA.Janae Bridgeman and coordination with wc propulsion to therapy gym. Pt needed max cues and demonstration initially to motor plan bimanual task, but with practice needed more min A and min/mod cues to avoid objects and stay straight in hallway. Pt performed stand-pivot to therapy mat and foam block placed under feet. Worked on standing balance/endurance on foam block with CGA. Incorporated fine motor coordination with focus on in-hand manipulation, translation, and rotation of small pegs. Using BITS, addressed attention and visual scanning with user paced dot activity. Pt sllightly slower with dots in L quadrants, but reaction time improved with repetition. Addressed memory with BITS using words and corresponding images. 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 OT graded task to try just numbers. Pt perseverating on the word "apple" which was in the previous task. Pt needed MAX verbal cues to understand number relationship. Pt returned to room in wc and ambulated to the bathroom with CGA and no AD. Pt stood at commode and voided bladder. Max cues to locate sink on R side of room when exiting the bathroom and verbal cues to locate soap.  Pt ambulated back to bed with CGA. Pt left semi-reclined in bed with bed alarm on, call bell in reach, and needs met.    Therapy Documentation Precautions:   Precautions Precautions: Fall Precaution Comments: profound cognitive deficits and impaired communication; seizures Restrictions Weight Bearing Restrictions: No Pain:  Denies pain  Therapy/Group: Individual Therapy  Valma Cava 03/16/2021, 1:52 PM

## 2021-03-16 NOTE — Progress Notes (Addendum)
[facility] PHYSICAL MEDICINE & REHABILITATION PROGRESS NOTE  Subjective/Complaints: Pt up in bed. No new complaints. Appears comfortable  ROS: Limited due to cognitive/behavioral   Objective: Vital Signs: Blood pressure (!) 157/90, pulse 71, temperature 97.7 F (36.5 C), resp. rate 16, height 5\' 3"  (1.6 m), weight 67.8 kg, SpO2 100 %. No results found. No results for input(s): WBC, HGB, HCT, PLT in the last 72 hours.  Recent Labs    03/15/21 0539 03/16/21 0501  NA 128* 136  K 4.1 4.4  CL 98 104  CO2 23 25  GLUCOSE 101* 84  BUN 13 14  CREATININE 1.07 1.19  CALCIUM 8.7* 9.0    Intake/Output Summary (Last 24 hours) at 03/16/2021 1007 Last data filed at 03/16/2021 0700 Gross per 24 hour  Intake 720 ml  Output 1700 ml  Net -980 ml         Physical Exam: BP (!) 157/90 (BP Location: Right Arm)   Pulse 71   Temp 97.7 F (36.5 C)   Resp 16   Ht 5\' 3"  (1.6 m)   Wt 67.8 kg   SpO2 100%   BMI 26.48 kg/m  Constitutional: No distress . Vital signs reviewed. HEENT: NCAT, EOMI, oral membranes moist Neck: supple Cardiovascular: RRR without murmur. No JVD    Respiratory/Chest: CTA Bilaterally without wheezes or rales. Normal effort    GI/Abdomen: BS +, non-tender, non-distended Ext: no clubbing, cyanosis, or edema Psych: pleasant and cooperative  Musc: No edema in extremities.  No tenderness in extremities. Neuro: Alert and oriented to self, place. Ongoing Word finding deficits. Remains distracted. Sensory exam normal for light touch and pain in all 4 limbs. No limb ataxia or cerebellar signs. No abnormal tone appreciated.   Motor: 4/5 throughout, stable  Assessment/Plan: 1. Functional deficits which require 3+ hours per day of interdisciplinary therapy in a comprehensive inpatient rehab setting. Physiatrist is providing close team supervision and 24 hour management of active medical problems listed below. Physiatrist and rehab team continue to assess barriers to  discharge/monitor patient progress toward functional and medical goals   Care Tool:  Bathing    Body parts bathed by patient: Right arm, Left arm, Chest, Abdomen, Front perineal area, Buttocks, Right upper leg, Left upper leg, Right lower leg, Left lower leg, Face         Bathing assist Assist Level: Supervision/Verbal cueing (min cues occ for sequencing)     Upper Body Dressing/Undressing Upper body dressing   What is the patient wearing?: Pull over shirt    Upper body assist Assist Level: Set up assist    Lower Body Dressing/Undressing Lower body dressing      What is the patient wearing?: Pants     Lower body assist Assist for lower body dressing: Supervision/Verbal cueing     Toileting Toileting Toileting Activity did not occur (Clothing management and hygiene only): N/A (no void or bm)  Toileting assist Assist for toileting: Contact Guard/Touching assist     Transfers Chair/bed transfer  Transfers assist     Chair/bed transfer assist level: Supervision/Verbal cueing     Locomotion Ambulation   Ambulation assist      Assist level: Contact Guard/Touching assist Assistive device: No Device Max distance: 300+   Walk 10 feet activity   Assist     Assist level: Contact Guard/Touching assist Assistive device: No Device   Walk 50 feet activity   Assist    Assist level: Contact Guard/Touching assist Assistive device: No Device  Walk 150 feet activity   Assist Walk 150 feet activity did not occur: Safety/medical concerns (fatigue, weakness, decreased balance)  Assist level: Contact Guard/Touching assist Assistive device: No Device    Walk 10 feet on uneven surface  activity   Assist     Assist level: Minimal Assistance - Patient > 75% Assistive device: Other (comment) (handrail)   Wheelchair     Assist Is the patient using a wheelchair?: Yes Type of Wheelchair: Manual    Wheelchair assist level: Supervision/Verbal  cueing Max wheelchair distance: 45ft    Wheelchair 50 feet with 2 turns activity    Assist        Assist Level: Maximal Assistance - Patient 25 - 49% (per PT note)   Wheelchair 150 feet activity     Assist      Assist Level: Total Assistance - Patient < 25% (per PT note)  BP (!) 157/90 (BP Location: Right Arm)   Pulse 71   Temp 97.7 F (36.5 C)   Resp 16   Ht 5\' 3"  (1.6 m)   Wt 67.8 kg   SpO2 100%   BMI 26.48 kg/m    Medical Problem List and Plan: 1.  Deficits in mobility, self-care, cognition secondary to seizures (left parietal region) and resulting encephalopathy.  -Continue CIR therapies including PT, OT, and SLP  2.  DVT prophylaxis/anticoagulation:   Mechanical: Sequential compression devices, below knee Bilateral lower extremities Pharmaceutical: Lovenox                -antiplatelet therapy: ASA 81 3. Pain Management: As needed meds             Team support 4.  Mood: Klonopin 1 mg twice daily for anxiety             -antipsychotic agents:N/A 5. Neuropsych: This patient is not capable of making decisions on his own behalf. 6. Skin/Wound Care: Routine skin care 7. Fluids/Electrolytes/Nutrition: Routine I/Os            encourage PO -low albumin--protein supp 8.  Essential hypertension             Norvasc 10, clonidine 0.1 mg TID, Lisinopril 10 mg daily -DBP consistently elevated-- increase lisinopril to 20mg  daily 9.  Seizures Perampanel 4 mg x 5 days, then 2 mg x 10 days Phenytoin taper completed Vimpat 200 mg twice daily             Keppra 1500 mg twice daily no breakthrough seizures since admission to rehab 10.  Diabetes mellitus type 2             Metformin 500 mg twice daily PTA             CBG (last 3)  Recent Labs    03/15/21 0604 03/15/21 1113 03/15/21 1610  GLUCAP 103* 106* 94    -reasonable control 10/20 11.  Hyponatremia             Sodium 131 on 10/15--> 128 10/19-->136 10/20  -reduce FR to 1200 cc  -recheck Monday LOS: 5  days A FACE TO FACE EVALUATION WAS PERFORMED  11/20 03/16/2021, 10:07 AM

## 2021-03-17 LAB — GLUCOSE, CAPILLARY
Glucose-Capillary: 111 mg/dL — ABNORMAL HIGH (ref 70–99)
Glucose-Capillary: 100 mg/dL — ABNORMAL HIGH (ref 70–99)
Glucose-Capillary: 110 mg/dL — ABNORMAL HIGH (ref 70–99)
Glucose-Capillary: 101 mg/dL — ABNORMAL HIGH (ref 70–99)

## 2021-03-17 MED ORDER — HYDRALAZINE HCL 10 MG PO TABS
10.0000 mg | ORAL_TABLET | Freq: Three times a day (TID) | ORAL | Status: DC | PRN
  Administered 2021-03-17 – 2021-03-22 (×3): 10 mg via ORAL
  Filled 2021-03-17 (×5): qty 1

## 2021-03-17 MED ORDER — LISINOPRIL 20 MG PO TABS
20.0000 mg | ORAL_TABLET | Freq: Two times a day (BID) | ORAL | Status: DC
  Administered 2021-03-17 – 2021-03-24 (×14): 20 mg via ORAL
  Filled 2021-03-17 (×14): qty 1

## 2021-03-17 NOTE — Progress Notes (Signed)
Occupational Therapy Session Note  Patient Details  Name: Arthur [name] MRN: [id number] Date of Birth: [birth year]  Today's Date: 03/17/2021 OT Individual Time: 5621-3086 OT Individual Time Calculation (min): 59 min   Today's Date: 03/17/2021 OT Missed Time: 58 Minutes Missed Time Reason: Other (comment) (elevated BP)  Short Term Goals: Week 1:  OT Short Term Goal 1 (Week 1): STGs=LTGs due to ELOS  Skilled Therapeutic Interventions/Progress Updates:    Session 1: Pt received in recliner with no pain. RN in room taking vitals 161/107. RN delivers BP meds however mobilization delayed d/t elevated diastolic pressure. Pt participates in cognitive activities till BP checked again. Pt declines ADL till afternoon session. BP at end of session:    Therapeutic activity TV left & door open on to challenge selective attention  Color dot sorting activity: total cuing initially, decreasing to max cuing for problem solving, finally mod cuing as pt often loses track of organization skipping around before finishing a swap/step of the puzzle  Pipe tree activity: pt demo significant L inattention with first activity requiring up to MAX cuing to locate far L colors, therefore this activity set up with pieces on L for far visual scanning. 2 figures completed- 1st with picture for reference (mod complex-cuing to initiate and explain; max visual pointing cues to keep track of where pt is in figure from picture & doing 1 piece at a time & ) 2nd with no picture to work visual memory (simple "t" shape- unable to recall despite calling it a "cross" and unable to recall what he said it looked like)  Pt left at end of session in recliner with exit alarm on, call light in reach and all needs met   Session 2:  Per SLP BP significantly elevated for her session. Rechecked upon entry of room with pt seated in bed and no pain. BP 171/109. Holding tx d/t elevated pressure. Will continue to attempt later once PRN  medication has more time to improve level.   Attempted again later during day and BP still 166/106. Continued to hold tx till BP lower.    Therapy Documentation Precautions:  Precautions Precautions: Fall Precaution Comments: profound cognitive deficits and impaired communication; seizures Restrictions Weight Bearing Restrictions: No General:     Therapy/Group: Individual Therapy  Tonny Branch 03/17/2021, 6:39 AM

## 2021-03-17 NOTE — Progress Notes (Signed)
Patient's BP elevated at 800 - 154/105 . BP medications given. Recheck at 0900 was 151/104. 10:30 recheck, manual, 190/108. PA notified. Orders to continue to monitor as lisinopril already increased dose this morning. Will recheck.

## 2021-03-17 NOTE — Progress Notes (Signed)
[facility] PHYSICAL MEDICINE & REHABILITATION PROGRESS NOTE  Subjective/Complaints: Pt up in bed. No new complaints. In pleasant mood  ROS: limited due to language/communication   Objective: Vital Signs: Blood pressure (!) 154/105, pulse 76, temperature 98.1 F (36.7 C), resp. rate 18, height 5\' 3"  (1.6 m), weight 67.8 kg, SpO2 100 %. No results found. No results for input(s): WBC, HGB, HCT, PLT in the last 72 hours.  Recent Labs    03/15/21 0539 03/16/21 0501  NA 128* 136  K 4.1 4.4  CL 98 104  CO2 23 25  GLUCOSE 101* 84  BUN 13 14  CREATININE 1.07 1.19  CALCIUM 8.7* 9.0    Intake/Output Summary (Last 24 hours) at 03/17/2021 1020 Last data filed at 03/17/2021 0727 Gross per 24 hour  Intake 718 ml  Output 600 ml  Net 118 ml         Physical Exam: BP (!) 154/105   Pulse 76   Temp 98.1 F (36.7 C)   Resp 18   Ht 5\' 3"  (1.6 m)   Wt 67.8 kg   SpO2 100%   BMI 26.48 kg/m  Constitutional: No distress . Vital signs reviewed. HEENT: NCAT, EOMI, oral membranes moist Neck: supple Cardiovascular: RRR without murmur. No JVD    Respiratory/Chest: CTA Bilaterally without wheezes or rales. Normal effort    GI/Abdomen: BS +, non-tender, non-distended Ext: no clubbing, cyanosis, or edema Psych: pleasant and cooperative  Musc: No edema in extremities.  No tenderness in extremities. Neuro: Alert and oriented to self, place. Distracted with persistent word finding deficits. Sensory exam normal for light touch and pain in all 4 limbs. No limb ataxia or cerebellar signs. No abnormal tone appreciated.   Motor: 4/5 throughout, stable  Assessment/Plan: 1. Functional deficits which require 3+ hours per day of interdisciplinary therapy in a comprehensive inpatient rehab setting. Physiatrist is providing close team supervision and 24 hour management of active medical problems listed below. Physiatrist and rehab team continue to assess barriers to discharge/monitor patient  progress toward functional and medical goals   Care Tool:  Bathing    Body parts bathed by patient: Right arm, Left arm, Chest, Abdomen, Front perineal area, Buttocks, Right upper leg, Left upper leg, Right lower leg, Left lower leg, Face         Bathing assist Assist Level: Supervision/Verbal cueing (min cues occ for sequencing)     Upper Body Dressing/Undressing Upper body dressing   What is the patient wearing?: Pull over shirt    Upper body assist Assist Level: Set up assist    Lower Body Dressing/Undressing Lower body dressing      What is the patient wearing?: Pants     Lower body assist Assist for lower body dressing: Supervision/Verbal cueing     Toileting Toileting Toileting Activity did not occur (Clothing management and hygiene only): N/A (no void or bm)  Toileting assist Assist for toileting: Contact Guard/Touching assist     Transfers Chair/bed transfer  Transfers assist     Chair/bed transfer assist level: Supervision/Verbal cueing     Locomotion Ambulation   Ambulation assist      Assist level: Contact Guard/Touching assist Assistive device: No Device Max distance: >200   Walk 10 feet activity   Assist     Assist level: Contact Guard/Touching assist Assistive device: No Device   Walk 50 feet activity   Assist    Assist level: Contact Guard/Touching assist Assistive device: No Device    Walk 150  feet activity   Assist Walk 150 feet activity did not occur: Safety/medical concerns (fatigue, weakness, decreased balance)  Assist level: Contact Guard/Touching assist Assistive device: No Device    Walk 10 feet on uneven surface  activity   Assist     Assist level: Minimal Assistance - Patient > 75% Assistive device: Other (comment) (handrail)   Wheelchair     Assist Is the patient using a wheelchair?: Yes Type of Wheelchair: Manual    Wheelchair assist level: Supervision/Verbal cueing Max wheelchair  distance: 66ft    Wheelchair 50 feet with 2 turns activity    Assist        Assist Level: Maximal Assistance - Patient 25 - 49% (per PT note)   Wheelchair 150 feet activity     Assist      Assist Level: Total Assistance - Patient < 25% (per PT note)  BP (!) 154/105   Pulse 76   Temp 98.1 F (36.7 C)   Resp 18   Ht 5\' 3"  (1.6 m)   Wt 67.8 kg   SpO2 100%   BMI 26.48 kg/m    Medical Problem List and Plan: 1.  Deficits in mobility, self-care, cognition secondary to seizures (left parietal region) and resulting encephalopathy.  -Continue CIR therapies including PT, OT, and SLP  2.  DVT prophylaxis/anticoagulation:   Mechanical: Sequential compression devices, below knee Bilateral lower extremities Pharmaceutical: Lovenox                -antiplatelet therapy: ASA 81 3. Pain Management: As needed meds             Team support 4.  Mood: Klonopin 1 mg twice daily for anxiety             -antipsychotic agents:N/A 5. Neuropsych: This patient is not capable of making decisions on his own behalf. 6. Skin/Wound Care: Routine skin care 7. Fluids/Electrolytes/Nutrition:              encourage PO -low albumin--protein supp 8.  Essential hypertension             Norvasc 10, clonidine 0.1 mg TID, Lisinopril 10 mg daily 10/21-DBP>SBP remains elevated-- increase lisinopril to 20mg  BID  -recheck BMET Monday  -prn hydralazine 9.  Seizures Perampanel 4 mg x 5 days, then 2 mg x 10 days Phenytoin taper completed Vimpat 200 mg twice daily             Keppra 1500 mg twice daily no breakthrough seizures since admission to rehab 10.  Diabetes mellitus type 2             Metformin 500 mg twice daily PTA             CBG (last 3)  Recent Labs    03/16/21 1138 03/16/21 1621 03/17/21 0619  GLUCAP 74 126* 111*    -reasonable control 10/20 11.  Hyponatremia             Sodium 131 on 10/15--> 128 10/19-->136 10/20  -reduce FR to 1200 cc  -recheck Monday LOS: 6 days A FACE TO  FACE EVALUATION WAS PERFORMED  11/20 03/17/2021, 10:20 AM

## 2021-03-17 NOTE — Progress Notes (Signed)
Speech Language Pathology Daily Session Note  Patient Details  Name: Arthur [name] MRN: [id number] Date of Birth: 12/27/1960  Today's Date: 03/17/2021 SLP Individual Time: 1300-1340 SLP Individual Time Calculation (min): 40 min  Short Term Goals: Week 1: SLP Short Term Goal 1 (Week 1): STGs=LTGs due to ELOS  Skilled Therapeutic Interventions: Skilled treatment session focused on cognitive-linguistic goals. Upon arrival, patient was sitting upright in the recliner. Patient appeared confused and mildly paranoid by reporting that "they are trying to kill me" and referring to his wife being pregnant and not wanting the baby. 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 RN made aware and vitals taken. BP:220/120. RN administered PRN medications and patient was transferred back to bed. Once in supine, patient reported he was feeling mildly better and thought he was going to "pass out." Patient's overall verbal expression appeared more appropriate with minimal confusion noted. Patient was able to utilize his calendar for recall of date with extra time and Min verbal cues. Patient also participated in a picture description task with moderate phonemic paraphasias in which patient demonstrated increased awareness of errors with increased ability to self-correct. Patient's BP was retaken at end of session: 180/109. Patient left upright in bed with alarm on and all needs within reach. Continue with current plan of care.      Pain No/Denies Pain   Therapy/Group: Individual Therapy  Armie Moren 03/17/2021, 2:58 PM

## 2021-03-17 NOTE — Progress Notes (Signed)
Physical Therapy Session Note  Patient Details  Name: Arthur [name] MRN: [id number] Date of Birth: 27-Nov-1960  Today's Date: 03/17/2021 PT Individual Time: 0915-1015 PT Individual Time Calculation (min): 60 min   Short Term Goals: Week 1:  PT Short Term Goal 1 (Week 1): STG=LTG due to LOS  Skilled Therapeutic Interventions/Progress Updates: Pt presents sitting in recliner and agreeable to therapy.  Pt transfers sit to stand w/ close supervision.  Pt amb > 200' w/o AD and occasional verbal cues for improved reciprocal gait pattern as well as occasioanl cueing for avoiding objects to R.  Pt performed cone obstacle course as well as tapping 9" cone w/ alternating foot in obstacle course.  Pt requires verbal cues for speed and safety, w/ occasional LOB.  Pt performed standing on chair rail of UBE at level 1 x 3' w/o LOB.  Pt performed standing BITS performance for reaching outside of BOS as well as performing "drawing between the lines".  Pt returned to room and to recliner.  BP checked and elevated to 184/110.  Nurse notified and performed manual BP at same levels, notified PA and will modify.  Chair alarm on and all needs in reach.     Therapy Documentation Precautions:  Precautions Precautions: Fall Precaution Comments: profound cognitive deficits and impaired communication; seizures Restrictions Weight Bearing Restrictions: No General:   Vital Signs: Therapy Vitals BP: (!) 154/105 Pain:0/10 Pain Assessment Pain Scale: 0-10 Pain Score: 0-No pain Mobility:      Therapy/Group: Individual Therapy  [name] 03/17/2021, 10:15 AM

## 2021-03-18 LAB — GLUCOSE, CAPILLARY
Glucose-Capillary: 112 mg/dL — ABNORMAL HIGH (ref 70–99)
Glucose-Capillary: 73 mg/dL (ref 70–99)
Glucose-Capillary: 96 mg/dL (ref 70–99)

## 2021-03-18 MED ORDER — SORBITOL 70 % SOLN
30.0000 mL | Freq: Once | Status: AC
  Administered 2021-03-18: 30 mL via ORAL
  Filled 2021-03-18: qty 30

## 2021-03-18 NOTE — Progress Notes (Signed)
[facility] PHYSICAL MEDICINE & REHABILITATION PROGRESS NOTE  Subjective/Complaints: Eating well Sorbitol ordered for constipation Has no new complaints Denies pain Tolerated SLP well today  ROS: Limited due to language/communication   Objective: Vital Signs: Blood pressure 116/73, pulse 73, temperature 97.8 F (36.6 C), temperature source Oral, resp. rate 18, height 5\' 3"  (1.6 m), weight 67.8 kg, SpO2 100 %. No results found. No results for input(s): WBC, HGB, HCT, PLT in the last 72 hours.  Recent Labs    03/16/21 0501  NA 136  K 4.4  CL 104  CO2 25  GLUCOSE 84  BUN 14  CREATININE 1.19  CALCIUM 9.0    Intake/Output Summary (Last 24 hours) at 03/18/2021 1958 Last data filed at 03/18/2021 1855 Gross per 24 hour  Intake 780 ml  Output 250 ml  Net 530 ml         Physical Exam: BP 116/73   Pulse 73   Temp 97.8 F (36.6 C) (Oral)   Resp 18   Ht 5\' 3"  (1.6 m)   Wt 67.8 kg   SpO2 100%   BMI 26.48 kg/m  Gen: no distress, normal appearing HEENT: oral mucosa pink and moist, NCAT Cardio: Reg rate Chest: normal effort, normal rate of breathing Abd: soft, non-distended Ext: no edema Psych: pleasant, normal affect Skin: intact  Musc: No edema in extremities.  No tenderness in extremities. Neuro: Alert and oriented to self, place. Distracted with persistent word finding deficits. Sensory exam normal for light touch and pain in all 4 limbs. No limb ataxia or cerebellar signs. No abnormal tone appreciated.   Motor: 4/5 throughout, stable  Assessment/Plan: 1. Functional deficits which require 3+ hours per day of interdisciplinary therapy in a comprehensive inpatient rehab setting. Physiatrist is providing close team supervision and 24 hour management of active medical problems listed below. Physiatrist and rehab team continue to assess barriers to discharge/monitor patient progress toward functional and medical goals   Care Tool:  Bathing    Body parts  bathed by patient: Right arm, Left arm, Chest, Abdomen, Front perineal area, Buttocks, Right upper leg, Left upper leg, Right lower leg, Left lower leg, Face         Bathing assist Assist Level: Supervision/Verbal cueing (min cues occ for sequencing)     Upper Body Dressing/Undressing Upper body dressing   What is the patient wearing?: Pull over shirt    Upper body assist Assist Level: Set up assist    Lower Body Dressing/Undressing Lower body dressing      What is the patient wearing?: Pants     Lower body assist Assist for lower body dressing: Supervision/Verbal cueing     Toileting Toileting Toileting Activity did not occur (Clothing management and hygiene only): N/A (no void or bm)  Toileting assist Assist for toileting: Contact Guard/Touching assist     Transfers Chair/bed transfer  Transfers assist     Chair/bed transfer assist level: Supervision/Verbal cueing     Locomotion Ambulation   Ambulation assist      Assist level: Contact Guard/Touching assist Assistive device: No Device Max distance: >200   Walk 10 feet activity   Assist     Assist level: Contact Guard/Touching assist Assistive device: No Device   Walk 50 feet activity   Assist    Assist level: Contact Guard/Touching assist Assistive device: No Device    Walk 150 feet activity   Assist Walk 150 feet activity did not occur: Safety/medical concerns (fatigue, weakness, decreased balance)  Assist level: Contact Guard/Touching assist Assistive device: No Device    Walk 10 feet on uneven surface  activity   Assist     Assist level: Minimal Assistance - Patient > 75% Assistive device: Other (comment) (handrail)   Wheelchair     Assist Is the patient using a wheelchair?: Yes Type of Wheelchair: Manual    Wheelchair assist level: Supervision/Verbal cueing Max wheelchair distance: 68ft    Wheelchair 50 feet with 2 turns activity    Assist         Assist Level: Maximal Assistance - Patient 25 - 49% (per PT note)   Wheelchair 150 feet activity     Assist      Assist Level: Total Assistance - Patient < 25% (per PT note)  BP 116/73   Pulse 73   Temp 97.8 F (36.6 C) (Oral)   Resp 18   Ht 5\' 3"  (1.6 m)   Wt 67.8 kg   SpO2 100%   BMI 26.48 kg/m    Medical Problem List and Plan: 1.  Deficits in mobility, self-care, cognition secondary to seizures (left parietal region) and resulting encephalopathy.  -Continue CIR therapies including PT, OT, and SLP  2.  Impaired mobility   Mechanical: Sequential compression devices, below knee Bilateral lower extremities Pharmaceutical: continue Lovenox                -antiplatelet therapy: ASA 81 3. Pain Management: As needed meds             Team support 4.  Mood: Klonopin 1 mg twice daily for anxiety             -antipsychotic agents:N/A 5. Neuropsych: This patient is not capable of making decisions on his own behalf. 6. Skin/Wound Care: Routine skin care 7. Fluids/Electrolytes/Nutrition:              encourage PO -low albumin--protein supp 8.  Essential hypertension             Norvasc 10, clonidine 0.1 mg TID, Lisinopril 10 mg daily 10/21-DBP>SBP remains elevated-- increase lisinopril to 20mg  BID  -recheck BMET Monday  -prn hydralazine 10/22: well controlled, continue above regimen 9.  Seizures Perampanel 4 mg x 5 days, then 2 mg x 10 days Phenytoin taper completed Vimpat 200 mg twice daily             Keppra 1500 mg twice daily no breakthrough seizures since admission to rehab 10.  Diabetes mellitus type 2             Metformin 500 mg twice daily PTA             CBG (last 3)  Recent Labs    03/18/21 0545 03/18/21 1139 03/18/21 1632  GLUCAP 112* 73 96    -reasonable control 10/20 11.  Hyponatremia             Sodium 131 on 10/15--> 128 10/19-->136 10/20  -reduce FR to 1200 cc  -recheck Monday 12. Constipation: sorbitol ordered LOS: 7 days A FACE TO FACE  EVALUATION WAS PERFORMED  11/20 Raizel Wesolowski 03/18/2021, 7:58 PM

## 2021-03-18 NOTE — Progress Notes (Signed)
Speech Language Pathology Daily Session Note  Patient Details  Name: Arthur [name] MRN: [id number] Date of Birth: 04-09-1961  Today's Date: 03/18/2021 SLP Individual Time: 3428-7681 SLP Individual Time Calculation (min): 44 min  Short Term Goals: Week 1: SLP Short Term Goal 1 (Week 1): STGs=LTGs due to ELOS  Skilled Therapeutic Interventions: Pt seen for skilled ST with focus on cognitive communication goals, pt in bed after AM meal and agreeable to therapeutic tasks. Pt unable to detail reason for hospitalization and rehab admission, notable word finding deficits during rapport building conversation. SLP facilitating simple yes/no questions by providing mod A cues for 60% accuracy. Pt able to produce target word during simple description task (a fruit that is yellow and peeled) on 20% opportunities provided mod-max A multimodal cues. Pt demonstrating significant phonemic and semantic paraphasia during language tasks, able to ID errors approximately 50% of time. Pt observed utilizing room phone to call wife with min A cues to sequence task, telling her he is leaving today which pt was perseverating on throughout tx session despite education and re-direction. Pt left in bed with alarm set and all needs within reach.  Cont ST POC.  Pain Pain Assessment Pain Scale: 0-10 Pain Score: 0-No pain  Therapy/Group: Individual Therapy  Tacey Ruiz 03/18/2021, 8:28 AM

## 2021-03-18 NOTE — Progress Notes (Signed)
Occupational Therapy Session Note  Patient Details  Name: Arthur [name] MRN: [id number] Date of Birth: [birth year]  Today's Date: 03/19/2021 OT Individual Time: 0350-0938 OT Individual Time Calculation (min): 58 min    Short Term Goals: Week 1:  OT Short Term Goal 1 (Week 1): STGs=LTGs due to ELOS  Skilled Therapeutic Interventions/Progress Updates:    Pt greeted in bed with no c/o pain. Agreeable to shower. Ambulatory transfer to TTB completed with close supervision without AD, vcs for decreasing furniture walking tendencies. He completed bathing while standing 95% of the time with supervision for balance, able to sequence putting soap on wash cloth without cues, which is an improvement from session with this therapist last weekend. He sat with cuing to wash his feet to maximize safety. Afterwards pt completed dressing from a chair at sit<stand level, able to realize that he had his shirt inside out and self corrected. Oral care completed while standing at the sink with supervision and increased time due to thoroughness/perseverative tendencies. Note that he threw the toothbrush away after single use. Afterwards pt ambulated to the dayroom, then down both ends of the west unit hallways, pt unable to recall his room number but able to visually locate his room. Supervision assist for ambulation without AD, pt with slow pace. Once in the room, pt initiated removing linen from bed and making bed up with sheet and 2 blankets. He required vcs and assist for basic sequencing of bedmaking tasks. He returned to flat bed without bedrails afterwards, remained in bed with all needs within reach and bed alarm set.   Therapy Documentation Precautions:  Precautions Precautions: Fall Precaution Comments: profound cognitive deficits and impaired communication; seizures Restrictions Weight Bearing Restrictions: No  ADL: ADL Eating: Not assessed Grooming: Minimal assistance Where Assessed-Grooming: Standing  at sink Upper Body Bathing: Minimal assistance Where Assessed-Upper Body Bathing: Shower Lower Body Bathing: Minimal assistance Where Assessed-Lower Body Bathing: Shower Upper Body Dressing: Supervision/safety Where Assessed-Upper Body Dressing: Chair Lower Body Dressing: Contact guard Where Assessed-Lower Body Dressing: Chair Toileting: Minimal assistance Where Assessed-Toileting: Teacher, adult education: Curator Method: Ambulating (no AD) Acupuncturist: Acupuncturist: Insurance underwriter Method: Ambulating (without AD) Astronomer: Emergency planning/management officer, Grab bars  Therapy/Group: Individual Therapy  Tracee Mccreery A [name] 03/19/2021, 12:41 PM

## 2021-03-19 LAB — GLUCOSE, CAPILLARY
Glucose-Capillary: 97 mg/dL (ref 70–99)
Glucose-Capillary: 88 mg/dL (ref 70–99)
Glucose-Capillary: 117 mg/dL — ABNORMAL HIGH (ref 70–99)

## 2021-03-20 LAB — BASIC METABOLIC PANEL
Anion gap: 8 (ref 5–15)
BUN: 11 mg/dL (ref 6–20)
CO2: 26 mmol/L (ref 22–32)
Calcium: 9.2 mg/dL (ref 8.9–10.3)
Chloride: 103 mmol/L (ref 98–111)
Creatinine, Ser: 1.04 mg/dL (ref 0.61–1.24)
GFR, Estimated: >60 mL/min (ref >60)
Glucose, Bld: 100 mg/dL — ABNORMAL HIGH (ref 70–99)
Potassium: 4 mmol/L (ref 3.5–5.1)
Sodium: 137 mmol/L (ref 135–145)

## 2021-03-20 LAB — GLUCOSE, CAPILLARY
Glucose-Capillary: 96 mg/dL (ref 70–99)
Glucose-Capillary: 84 mg/dL (ref 70–99)
Glucose-Capillary: 102 mg/dL — ABNORMAL HIGH (ref 70–99)
Glucose-Capillary: 125 mg/dL — ABNORMAL HIGH (ref 70–99)

## 2021-03-20 LAB — CBC
HCT: 40.9 % (ref 39.0–52.0)
Hemoglobin: 13.2 g/dL (ref 13.0–17.0)
MCH: 25 pg — ABNORMAL LOW (ref 26.0–34.0)
MCHC: 32.3 g/dL (ref 30.0–36.0)
MCV: 77.3 fL — ABNORMAL LOW (ref 80.0–100.0)
Platelets: 380 10*3/uL (ref 150–400)
RBC: 5.29 MIL/uL (ref 4.22–5.81)
RDW: 13.9 % (ref 11.5–15.5)
WBC: 6.3 10*3/uL (ref 4.0–10.5)
nRBC: 0 % (ref 0.0–0.2)

## 2021-03-20 NOTE — Discharge Summary (Signed)
Physician Discharge Summary  Patient ID: Arthur [name] MRN: [id number] DOB/AGE: April 07, 1961 60 y.o.  Admit date: 03/11/2021 Discharge date: 03/24/2021  Discharge Diagnoses:  Principal Problem:   Encephalopathy acute Active Problems:   Controlled type 2 diabetes mellitus with hyperglycemia, without long-term current use of insulin (HCC)   Benign essential HTN   Diabetes mellitus type 2 in nonobese [facility]) DVT prophylaxis Hypertension Mood stabilization Seizures Alcohol use Hyponatremia  Discharged Condition: Stable  Significant Diagnostic Studies: CT Head Wo Contrast  Result Date: 02/24/2021 CLINICAL DATA:  Generalized anxiety EXAM: CT HEAD WITHOUT CONTRAST TECHNIQUE: Contiguous axial images were obtained from the base of the skull through the vertex without intravenous contrast. COMPARISON:  10/16/2020 FINDINGS: Brain: No evidence of acute infarction, hemorrhage, hydrocephalus, extra-axial collection or mass lesion/mass effect. Periventricular and deep white matter hypodensity. Unchanged encephalomalacia of the left parietal lobe and anterior limb of the right internal capsule. Vascular: No hyperdense vessel or unexpected calcification. Skull: Normal. Negative for fracture or focal lesion. Sinuses/Orbits: No acute finding. Other: None. IMPRESSION: 1.  No acute intracranial pathology. 2. 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 Electronically Signed   By: Jearld Lesch M.D.   On: 02/24/2021 17:39   MR BRAIN WO CONTRAST  Result Date: 03/03/2021 CLINICAL DATA:  Seizure, nontraumatic. EXAM: MRI HEAD WITHOUT CONTRAST TECHNIQUE: Multiplanar, multiecho pulse sequences of the brain and surrounding structures were obtained without intravenous contrast. COMPARISON:  Head CT February 24, 2021; MRI of the brain Oct 17, 2020. FINDINGS: Brain: No acute infarction, hemorrhage, hydrocephalus, extra-axial  collection or mass lesion. 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 Scattered and confluent foci of T2 hyperintensity are seen within the white matter of the cerebral hemispheres and within the pons, nonspecific, most likely related to chronic small vessel ischemia. Restricted diffusion within the left hippocampus seen on prior MRI has resolved head. However, there is persistent increased T2 signal, now with decreased volume and blurring of internal structures of the left hippocampus suggesting mesial temporal sclerosis. Vascular: Normal flow voids. Skull and upper cervical spine: Normal marrow signal. Sinuses/Orbits: Mucous retention cysts in the bilateral maxillary sinuses. Mucosal thickening of the bilateral ethmoid cells. The orbits are maintained. Other: None. IMPRESSION: 1. Increased T2 signal, decreased volume and blurring of internal structures of the left hippocampus suggesting mesial temporal sclerosis. 2. 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 3. 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 Electronically Signed   By: Baldemar Lenis M.D.   On: 03/03/2021 10:23   DG Chest Portable 1 View  Result Date: 02/24/2021 CLINICAL DATA:  Anxiety. EXAM: PORTABLE CHEST 1 VIEW COMPARISON:  Oct 16, 2020 FINDINGS: Decreased lung volumes are seen with mild areas of atelectasis noted within the bilateral lung bases. There is no evidence of a pleural effusion or pneumothorax. The cardiac silhouette is moderately enlarged. This may be technical in origin. The visualized skeletal structures are unremarkable. IMPRESSION: Low lung volumes with mild bibasilar atelectasis. Electronically Signed   By: Aram Candela M.D.   On: 02/24/2021 20:11   EEG  adult  Result Date: 02/24/2021 Charlsie Quest, MD     02/24/2021  9:16 PM Patient Name: Arthur [name] MRN: [id number] Epilepsy Attending: Charlsie Quest  Referring Physician/Provider: Dr Ritta Slot Date: 02/24/2021 Duration: 27.41 mins Patient history: 60yo M with h/o epilepsy, presented with anxiety and had an episode of focal right-sided twitching, right gaze deviation and not following commands. EEG to evaluate for seizure Level of alertness:  lethargic AEDs during EEG study: LEV, LCM Technical aspects: This EEG study was done with scalp electrodes positioned according to the 10-20 International system of electrode placement. Electrical activity was acquired at a sampling rate of 500Hz  and reviewed with a high frequency filter of 70Hz  and a low frequency filter of 1Hz . EEG data were recorded continuously and digitally stored. Description: EEG showed continuous 2-3hz  rhythmic delta slowing in left hemisphere. There is also 3-5hz  continuous generalized and maximal left posterior quadrant theta-delta slowing admixed with 15-18Hz  generalized beta activity. Hyperventilation and photic stimulation were not performed.   ABNORMALITY - Lateralized rhythmic delta activity, left hemisphere and maximal left posterior quadrant - Continuous slow, generalized and maximal left posterior quadrant IMPRESSION: This study is suggestive of cortical dysfunction arising from left posterior quadrant likely secondary to underlying structural abnormality /stroke, post-ictal state.Of note, lateralized rhythmic delta activity seen in left hemisphere is also on the ictal-interictal continuum with low potential for seizure recurrence. Additionally, there is also moderate diffuse encephalopathy, nonspecific etiology but likely related to seizure. No seizures or definite epileptiform discharges were seen throughout the recording. Dr was notified. Priyanka   Overnight EEG with video  Result Date:  02/25/2021 Derry Lory, MD     02/26/2021  9:07 AM Patient Name: Arthur [name] MRN: Charlsie Quest Epilepsy Attending: 04/28/2021 Referring Physician/Provider: Dr [name] Duration: 02/24/2021 2137 to 02/25/2021 2137  Patient history: 60yo M with h/o epilepsy, presented with anxiety and had an episode of focal right-sided twitching, right gaze deviation and not following commands. EEG to evaluate for seizure  Level of alertness:  lethargic  AEDs during EEG study: LEV, LCM, PHT  Technical aspects: This EEG study was done with scalp electrodes positioned according to the 10-20 International system of electrode placement. Electrical activity was acquired at a sampling rate of 500Hz  and reviewed with a high frequency filter of 70Hz  and a low frequency filter of 1Hz . EEG data were recorded continuously and digitally stored.  Description: EEG initially showed continuous 2-3hz  rhythmic delta slowing in left hemisphere admixed with sharp waves in left posterior temporal region, maximal P7. At times there was evolution in frequency consistent with brief-ictal-interictal rhythmic discharges. Gradually the eeg pattern showed evolution in morphology ( appeared more sharply contoured admixed with sharp waves) as well as frequency ( fast 15-18hz  beta activity evolving into 2-3hz  rhythmic delta activity. Per review of notes, patient had trouble answering questions. 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 There is also 3-5hz  continuous generalized and maximal left posterior quadrant theta-delta slowing admixed with 15-18Hz  generalized beta activity. Hyperventilation and photic stimulation were not performed.    ABNORMALITY - Focal non convulsive status epilepticus, left posterior quadrant - Brief-ictal-interictal rhythmic discharges ( BIRD) , left posterior quadrant - Lateralized rhythmic delta activity, left hemisphere and maximal left posterior quadrant - Continuous slow, generalized and  maximal left posterior quadrant  IMPRESSION: This study initially showed evidence of epileptogenicity and cortical dysfunction arising from left posterior quadrant with brief-ictal-interictal rhythmic discharges. This EEG pattern is on the ictal-interictal continuum with high potential for seizures. Gradually eeg showed evolution in morphology and frequency.  Per review of notes, patient had trouble answering questions. This EEG is  consistent with focal non convulsive status epilepticus.  Additionally, there is also moderate diffuse encephalopathy, nonspecific etiology but likely related to seizure.  Priyanka Annabelle Harman    Labs:  Basic Metabolic Panel: Recent Labs  Lab 03/16/21 0501 03/20/21 0540  NA 136 137  K 4.4 4.0  CL 104 103  CO2 25 26  GLUCOSE 84 100*  BUN 14 11  CREATININE 1.19 1.04  CALCIUM 9.0 9.2    CBC: Recent Labs  Lab 03/20/21 0540  WBC 6.3  HGB 13.2  HCT 40.9  MCV 77.3*  PLT 380    CBG: Recent Labs  Lab 03/21/21 2122 03/22/21 0614 03/22/21 1144 03/22/21 1659 03/22/21 2110  GLUCAP 95 97 101* 92 105*   Family history.  Mother with diabetes and hypertension.  Father with diabetes and hypertension.  Denies any colon cancer esophageal cancer or rectal cancer  Brief HPI:   Arthur [name] is a 60 y.o. right-handed male with history of diabetes mellitus alcohol use hypertension seizures presented to the hospital 02/24/2021 with seizures.  Patient was at home presented to the ED with anxiety.  History taken from chart review due to cognition.  Patient's father recently passed away with resulting increase in stress as well as depression.  Patient was noted to have twitching of 1 side of his face, however no other associated symptoms thought to be stress related by patient and family.  On presentation to the ED patient was noted to have tonic-clonic activity of bilateral upper extremities without altered mental status or loss of consciousness.  He received loperamide with  resolution of symptoms.  He later had right facial twitching with altered mental status decreased responsiveness.  He was given IV Ativan with resolution of twitching.  There was no associated bowel or bladder incontinence or tongue biting.  Labs were ordered showing creatinine 1.20 which was patient's baseline EKG unchanged.  Head CT scan unremarkable.  Urine drug screen positive benzos.  Neurology consulted EEG showed left parietal slowing consistent with postictal state in the absence of overt evidence of active seizure.  Long-term EEG x3 days was event free.  He was loaded with Keppra as well as Vimpat.  Due to patient decreased functional mobility cognition was admitted for a comprehensive rehab program.   Hospital Course: JAXTON CASALE was admitted to rehab 03/11/2021 for inpatient therapies to consist of PT, ST and OT at least three hours five days a week. Past admission physiatrist, therapy team and rehab RN have worked together to provide customized collaborative inpatient rehab.  Pertaining to patient's deficits in mobility self-care cognition secondary to seizure resulting encephalopathy.  He would follow-up outpatient.  Lovenox for DVT prophylaxis as well as low-dose aspirin.  Mood stabilization with Klonopin.  He did exhibit some mild confusion and perseveration.  Blood pressure with some increased variables maintained on on Norvasc, clonidine as well as lisinopril and titrated as needed as well as hydralazine increased to 25 mg every 8 hours and cardiology services consulted due to elevated blood pressure with the addition of Isordil and Klonopin was adjusted with hydralazine increased to 50 mg every 8 hours.  Follow-up echocardiogram with ejection fraction of 40 to 45% grade 2 diastolic dysfunction..  Seizure disorder with the use of Keppra 1500 mg twice daily as well as Vimpat 200 mg twice daily, Dilantin 50 mg for 5 doses as well as perampanel 4 mg x 5 days and 2 mg x 10 days.  Blood sugars  controlled and patient has  been on Glucophage prior to admission..  Mild hyponatremia responded well to fluid restriction.   Blood pressures were monitored on TID basis and controlled  Diabetes has been monitored with ac/hs CBG checks and SSI was use prn for tighter BS control.    Rehab course: During patient's stay in rehab weekly team conferences were held to monitor patient's progress, set goals and discuss barriers to discharge. At admission, patient required minimal assist sit to stand max assist stand pivot transfers minimal assist 75 feet 1 person hand-held assist  Physical exam.  Blood pressure 121/83 pulse 79 temperature 98.2 respiration 19 oxygen saturations 100% room air Constitutional.  No acute distress HEENT Head.  Normocephalic and atraumatic Eyes.  Pupils round and reactive to light no discharge without nystagmus Neck.  Supple nontender no JVD without thyromegaly Cardiac regular rate rhythm without extra sounds or murmur heard Abdomen.  Soft nontender positive bowel sounds without rebound Respiratory effort normal no respiratory distress without wheeze Musculoskeletal.  Normal range of motion no edema or tenderness Neurologic.  Alert oriented x1. Motor.  4/5 throughout. Mood was a bit flat speech was delayed  He/She  has had improvement in activity tolerance, balance, postural control as well as ability to compensate for deficits. He/She has had improvement in functional use RUE/LUE  and RLE/LLE as well as improvement in awareness.  Sit to stand with supervision for safety.  Ambulates 150 feet to the gym without assistive device.  Vies verbal cues for increased stride length and gait speed to decrease risk for falls.  Transitions patient to supine activities to limit risk of increased blood pressure and monitoring.  Sit to supine with cues for positioning.  Completed bed mobility supervision.  Declined to go to the bathroom or perform basic ADL tasks at times needing  encouragement.  Patient needed some direction explanations for his ADLs.  Speech therapy focused on communication goals providing minimal assist multimodal cues for patient to self monitor and correct phonemic errors at the word level.  Patient scored 10/10 in naming, 10/10 and automatic speech task but 0 out of 10 points in repetition, 18/20 points for yes no responses, 10/10 and object recognition but 2/10 for following instructions and 0/10 for reading instructions.  Patient with 80% accuracy which increased to 100% by end of sessions.  Full family teaching completed plan discharged to home       Disposition: Discharge to home    Diet: Carb modified  Special Instructions: No driving smoking or alcohol  Medications at discharge 1.  Tylenol as needed 2.  Norvasc 10 mg p.o. daily 3.  Aspirin 81 mg p.o. daily 4.  Klonopin 1 mg p.o. twice daily 5.  Clonidine patch 0.2 mg weekly 6.  Hydrocodone 1 tablet every 4 hours as needed moderate pain 7.  Vimpat 200 mg p.o. twice daily 8.  Keppra 1500 mg p.o. twice daily 9.  Lisinopril 20 mg p.o. twice daily 10.  Robaxin 500 mg p.o. every 6 hours as needed muscle spasms 11.Perapanel 2 mg p.o. nightly x3 days and stop 12.  Dilantin 50 mg p.o. daily x3 days and stop 13.  Senokot 2 tabs daily 14.Hydralazine 50 mg every 8 hour 15.Glucophage 500 mg BID 16.  Isordil 30 mg 3 times daily   30-35 minutes were spent completing discharge summary and discharge planning  Discharge Instructions     Ambulatory referral to Neurology   Complete by: As directed    An appointment is requested in approximately: Call for  appointment 2 months acute encephalopathy   Ambulatory referral to Physical Medicine Rehab   Complete by: As directed    Moderate complexity follow-up 1 to 2 weeks seizure/encephalopathy        Follow-up Information     Ranelle Oyster, MD Follow up.   Specialty: Physical Medicine and Rehabilitation Why: No formal follow-up  needed Contact information: [address] [place] [address] [phone]                 Signed: [name] 03/23/2021, 4:56 AM

## 2021-03-20 NOTE — Progress Notes (Signed)
[place] PHYSICAL MEDICINE & REHABILITATION PROGRESS NOTE  Subjective/Complaints:  No issues overnite , still confused as to day and date  ROS: Limited due to language/communication   Objective: Vital Signs: Blood pressure (!) 146/90, pulse 78, temperature (!) 97.5 F (36.4 C), temperature source Oral, resp. rate 17, height 5\' 3"  (1.6 m), weight 67.8 kg, SpO2 99 %. No results found. Recent Labs    03/20/21 0540  WBC 6.3  HGB 13.2  HCT 40.9  PLT 380    Recent Labs    03/20/21 0540  NA 137  K 4.0  CL 103  CO2 26  GLUCOSE 100*  BUN 11  CREATININE 1.04  CALCIUM 9.2     Intake/Output Summary (Last 24 hours) at 03/20/2021 1336 Last data filed at 03/20/2021 1100 Gross per 24 hour  Intake 480 ml  Output 1400 ml  Net -920 ml          Physical Exam: BP (!) 146/90   Pulse 78   Temp (!) 97.5 F (36.4 C) (Oral)   Resp 17   Ht 5\' 3"  (1.6 m)   Wt 67.8 kg   SpO2 99%   BMI 26.48 kg/m   General: No acute distress Mood and affect are appropriate Heart: Regular rate and rhythm no rubs murmurs or extra sounds Lungs: Clear to auscultation, breathing unlabored, no rales or wheezes Abdomen: Positive bowel sounds, soft nontender to palpation, nondistended Extremities: No clubbing, cyanosis, or edema Skin: No evidence of breakdown, no evidence of rash  Musc: No edema in extremities.  No tenderness in extremities. Neuro: Alert and oriented to self, place. Distracted with persistent word finding deficits. Sensory exam normal for light touch and pain in all 4 limbs. No limb ataxia or cerebellar signs. No abnormal tone appreciated.   Motor: 4/5 throughout, stable  Assessment/Plan: 1. Functional deficits which require 3+ hours per day of interdisciplinary therapy in a comprehensive inpatient rehab setting. Physiatrist is providing close team supervision and 24 hour management of active medical problems listed below. Physiatrist and rehab team continue to assess barriers  to discharge/monitor patient progress toward functional and medical goals   Care Tool:  Bathing    Body parts bathed by patient: Right arm, Left arm, Chest, Abdomen, Front perineal area, Buttocks, Right upper leg, Left upper leg, Right lower leg, Left lower leg, Face         Bathing assist Assist Level: Supervision/Verbal cueing     Upper Body Dressing/Undressing Upper body dressing   What is the patient wearing?: Pull over shirt    Upper body assist Assist Level: Set up assist    Lower Body Dressing/Undressing Lower body dressing      What is the patient wearing?: Pants     Lower body assist Assist for lower body dressing: Supervision/Verbal cueing     Toileting Toileting Toileting Activity did not occur (Clothing management and hygiene only): N/A (no void or bm)  Toileting assist Assist for toileting: Contact Guard/Touching assist     Transfers Chair/bed transfer  Transfers assist     Chair/bed transfer assist level: Supervision/Verbal cueing     Locomotion Ambulation   Ambulation assist      Assist level: Contact Guard/Touching assist Assistive device: No Device Max distance: >200   Walk 10 feet activity   Assist     Assist level: Contact Guard/Touching assist Assistive device: No Device   Walk 50 feet activity   Assist    Assist level: Contact Guard/Touching assist Assistive device:  No Device    Walk 150 feet activity   Assist Walk 150 feet activity did not occur: Safety/medical concerns (fatigue, weakness, decreased balance)  Assist level: Contact Guard/Touching assist Assistive device: No Device    Walk 10 feet on uneven surface  activity   Assist     Assist level: Minimal Assistance - Patient > 75% Assistive device: Other (comment) (handrail)   Wheelchair     Assist Is the patient using a wheelchair?: Yes Type of Wheelchair: Manual    Wheelchair assist level: Supervision/Verbal cueing Max wheelchair  distance: 66ft    Wheelchair 50 feet with 2 turns activity    Assist        Assist Level: Maximal Assistance - Patient 25 - 49% (per PT note)   Wheelchair 150 feet activity     Assist      Assist Level: Total Assistance - Patient < 25% (per PT note)  BP (!) 146/90   Pulse 78   Temp (!) 97.5 F (36.4 C) (Oral)   Resp 17   Ht 5\' 3"  (1.6 m)   Wt 67.8 kg   SpO2 99%   BMI 26.48 kg/m    Medical Problem List and Plan: 1.  Deficits in mobility, self-care, cognition secondary to seizures (+ benzos at admit, ? withdrawal, ETOH not checked) (left parietal region) and resulting encephalopathy.  -Continue CIR therapies including PT, OT, and SLP , team conf in am  Dr to cover 10/25-27 2.  Impaired mobility   Mechanical: Sequential compression devices, below knee Bilateral lower extremities Pharmaceutical: continue Lovenox                -antiplatelet therapy: ASA 81 3. Pain Management: As needed meds             Team support 4.  Mood: Klonopin 1 mg twice daily for anxiety             -antipsychotic agents:N/A 5. Neuropsych: This patient is not capable of making decisions on his own behalf. 6. Skin/Wound Care: Routine skin care 7. Fluids/Electrolytes/Nutrition:              encourage PO -low albumin--protein supp 8.  Essential hypertension             Norvasc 10, clonidine 0.1 mg TID, Lisinopril 10 mg daily 10/21-DBP>SBP remains elevated-- increase lisinopril to 20mg  BID  -recheck BMET Monday  -prn hydralazine Vitals:   03/20/21 1122 03/20/21 1307  BP: (!) 157/108 (!) 146/90  Pulse:  78  Resp:    Temp:    SpO2:     BMET nl , monitor on current meds may need to add diuretic now that BMET normalized  Fluid intake improving  9.  Seizures Perampanel 4 mg x 5 days, then 2 mg x 10 days Phenytoin taper completed Vimpat 200 mg twice daily             Keppra 1500 mg twice daily no breakthrough seizures since admission to rehab 10.  Diabetes mellitus type 2              Metformin 500 mg twice daily PTA             CBG (last 3)  Recent Labs    03/19/21 1618 03/20/21 0606 03/20/21 1147  GLUCAP 117* 96 84     -reasonable control 10/20 11.  Hyponatremia             resolved   -reduce FR to 1200  cc BMP Latest Ref Rng & Units 03/20/2021 03/16/2021 03/15/2021  Glucose 70 - 99 mg/dL 563(J) 84 497(W)  BUN 6 - 20 mg/dL 11 14 13   Creatinine 0.61 - 1.24 mg/dL 2.63 7.85  BUN/Creat Ratio 9 - 20 - - -  Sodium 135 - 145 mmol/L 137 136 128(L)  Potassium 3.5 - 5.1 mmol/L 4.0 4.4 4.1  Chloride 98 - 111 mmol/L 103 104 98  CO2 22 - 32 mmol/L 26 25 23   Calcium 8.9 - 10.3 mg/dL 9.2 9.0 8.85)    12. Constipation: sorbitol ordered LOS: 9 days A FACE TO FACE EVALUATION WAS PERFORMED  03/20/2021, 1:36 PM

## 2021-03-20 NOTE — Progress Notes (Signed)
Physical Therapy Session Note  Patient Details  Name: Arthur [name] MRN: [id number] Date of Birth: 01/20/1961  Today's Date: 03/20/2021 PT Individual Time: 0905-1000 PT Individual Time Calculation (min): 55 min   Short Term Goals: Week 1:  PT Short Term Goal 1 (Week 1): STG=LTG due to LOS  Skilled Therapeutic Interventions/Progress Updates:     Pt received seated in recliner and agrees to therapy. No complaint of pain. Sit to stand with supervision for safety. Pt ambulates x150' to gym without AD. PT provides verbal cues for increased stride length and gait speed to decrease risk for falls. BP taken in sitting once pt arrived to gym. 168/109. PT transitions pt to supine activities to limit risks of increased HTN. Sit to supine with cues for positioning. PT explains bridge exercise to pt but pt has difficulty following instructions. PT then demonstrates exercise and pt is able to recreate movement. 3x10 with 10 count hold on final rep. Pt then performs supine reciprocal hip and knee flexion with contralateral arm extension. PT demonstrates movement to pt several times before pt is able to perform correctly. Pt completes 3x10 with rest breaks in between. Supine to sit with cues for positioning and allowing body to acclimate to upright positioning prior to additional mobility for safety. Pt ambulates x130' to dayroom with cues to increase gait speed and bilateral step height and length to decrease risk for falls. Pt performs Nustep at workload of 5 for 10:00, with focus on strength and endurance training as well as maintaining maximal ROM during reciprocal movement. Pt ambulates back to room, x50', with CGA. Left seated in recliner with alarm intact and all needs within reach.   Therapy Documentation Precautions:  Precautions Precautions: Fall Precaution Comments: profound cognitive deficits and impaired communication; seizures Restrictions Weight Bearing Restrictions: No   Therapy/Group:  Individual Therapy  Beau Fanny, PT, DPT 03/20/2021, 3:55 PM

## 2021-03-20 NOTE — Progress Notes (Signed)
Speech Language Pathology Daily Session Note  Patient Details  Name: Arthur [name] MRN: [id number] Date of Birth: [birth year]  Today's Date: 03/20/2021 SLP Individual Time: 0820-0900 SLP Individual Time Calculation (min): 40 min  Short Term Goals: Week 1: SLP Short Term Goal 1 (Week 1): STGs=LTGs due to ELOS  Skilled Therapeutic Interventions: Skilled treatment session focused on communication goals. SLP facilitated session by providing overall Min A multimodal cues for patient to self-monitor and correct phonemic errors at the word level. Patient with 80% accuracy which increased to 100% by end of session. Patient also demonstrated improved emergent awareness of errors with increased ability to self-correct. Patient requested to use the bathroom and was continent of bladder. Patient handed off to PT. Continue with current plan of care.       Pain No/Denies   Therapy/Group: Individual Therapy  Jeidi Gilles 03/20/2021, 12:35 PM

## 2021-03-20 NOTE — Progress Notes (Signed)
Patient ID: ADONUS USELMAN, male   DOB: [birth year], 60 y.o.   MRN: [id number]  SW spoke with pt wife [name] to f/u about family education. Reports she still has challenges with finding support for someone to watch her adult autistic son, and no transportation. SW encouraged her to bring her son for family edu if he is able to tolerate being in the room for periods of time. SW also encouraged he to speak with nursing staff in the evening to get his therapy schedule for the next day if not able to put a time in place, and come to his scheduled sessions. She will f/u with SW if she is able to schedule. Reports transportation home will either be his dtr, or she will use uber transportation and accompany him.  Cecile Sheerer, MSW, LCSWA Office: [phone] Cell: [phone] Fax: [phone]

## 2021-03-20 NOTE — Progress Notes (Signed)
Occupational Therapy Session Note  Patient Details  Name: Arthur [name] MRN: [id number] Date of Birth: [birth year]  Today's Date: 03/20/2021 OT Individual Time: 1100-1130 OT Individual Time Calculation (min): 30 min    Short Term Goals: Week 1:  OT Short Term Goal 1 (Week 1): STGs=LTGs due to ELOS  Skilled Therapeutic Interventions/Progress Updates:    Pt received sleeping in bed, easily awoken and agreeable to OT session. BP supine 135/90. Ambulatory transfer into the bathroom with CGA, standing level toileting with CGA, continent void of urine. Pt returned to EOB and BP reassessed d/t complaint of dizziness. BP assessed 3x d/t high and inconsistent readings. Final reading was 153/114. LPN Caryl Pina alerted, who entered room and administered medication. Pt reporting no further dizziness. He completed pathfinding task with mod cueing, with functional mobility component, 250 ft at (S)- CGA level overall with no AD. Pt with STM deficits, unable to recall room number despite visual and auditory. Pt returned to supine in bed and was left with all needs met, bed alarm set.   Therapy Documentation Precautions:  Precautions Precautions: Fall Precaution Comments: profound cognitive deficits and impaired communication; seizures Restrictions Weight Bearing Restrictions: No   Therapy/Group: Individual Therapy  CAMRAN KEADY 03/20/2021, 11:13 AM

## 2021-03-20 NOTE — Progress Notes (Signed)
Occupational Therapy Session Note  Patient Details  Name: Arthur [name] MRN: [id number] Date of Birth: 1960/09/23  Today's Date: 03/20/2021 OT Individual Time: 7741-4239 OT Individual Time Calculation (min): 73 min   Short Term Goals: Week 1: LTG=STG 2/2 ELOS  Skilled Therapeutic Interventions/Progress Updates:    Pt greeted semi-reclined in bed and agreeable to OT treatment session. Pt completed bed mobility with supervision. He declined need to go to the bathroom or perform BADL tasks. Pt ambulated in hallway without AD and CGA. Wc used to take patient outside due to fatigue. Fine motor activity using medium soft theraputty and beads. Pt needed directions explained 2x but then was able to locate beads in putty with min cues. Focus on pincer grasp to collect beads. Functional ambulation on uneven surfaces outside of hospital. Also worked on following directions given to patient. Problem solving and fine motor coordination with card game of "Crazy 8's" Verbal cues initially to match corresponding cards, but with practice pt only needed min cues to find matching suits. Pt returned to room and ambulated back to bed with CGA. Pt left semi-reclined in bed with bed alarm on, call bell in reach, and needs met.   Therapy Documentation Precautions:  Precautions Precautions: Fall Precaution Comments: profound cognitive deficits and impaired communication; seizures Restrictions Weight Bearing Restrictions: No Pain:  Denies pain  Therapy/Group: Individual Therapy  Valma Cava 03/20/2021, 2:17 PM

## 2021-03-21 ENCOUNTER — Other Ambulatory Visit (HOSPITAL_COMMUNITY): Payer: Self-pay

## 2021-03-21 DIAGNOSIS — I10 Essential (primary) hypertension: Secondary | ICD-10-CM

## 2021-03-21 LAB — GLUCOSE, CAPILLARY
Glucose-Capillary: 91 mg/dL (ref 70–99)
Glucose-Capillary: 112 mg/dL — ABNORMAL HIGH (ref 70–99)
Glucose-Capillary: 105 mg/dL — ABNORMAL HIGH (ref 70–99)
Glucose-Capillary: 95 mg/dL (ref 70–99)

## 2021-03-21 MED ORDER — HYDROCODONE-ACETAMINOPHEN 5-325 MG PO TABS
1.0000 | ORAL_TABLET | ORAL | 0 refills | Status: DC | PRN
  Filled 2021-03-21: qty 30, 7d supply, fill #0

## 2021-03-21 MED ORDER — CLONIDINE HCL 0.1 MG PO TABS
0.1000 mg | ORAL_TABLET | Freq: Three times a day (TID) | ORAL | 0 refills | Status: DC
  Filled 2021-03-21: qty 90, 30d supply, fill #0

## 2021-03-21 MED ORDER — AMLODIPINE BESYLATE 10 MG PO TABS
10.0000 mg | ORAL_TABLET | Freq: Every day | ORAL | 0 refills | Status: DC
  Filled 2021-03-21: qty 30, 30d supply, fill #0

## 2021-03-21 MED ORDER — METHOCARBAMOL 500 MG PO TABS
500.0000 mg | ORAL_TABLET | Freq: Four times a day (QID) | ORAL | 0 refills | Status: DC | PRN
  Filled 2021-03-21: qty 60, 15d supply, fill #0

## 2021-03-21 MED ORDER — METFORMIN HCL 500 MG PO TABS
500.0000 mg | ORAL_TABLET | Freq: Two times a day (BID) | ORAL | 0 refills | Status: DC
  Filled 2021-03-21: qty 60, 30d supply, fill #0

## 2021-03-21 MED ORDER — LACOSAMIDE 200 MG PO TABS
200.0000 mg | ORAL_TABLET | Freq: Two times a day (BID) | ORAL | 0 refills | Status: DC
  Filled 2021-03-21: qty 60, 30d supply, fill #0

## 2021-03-21 MED ORDER — LISINOPRIL 20 MG PO TABS
20.0000 mg | ORAL_TABLET | Freq: Two times a day (BID) | ORAL | 0 refills | Status: DC
  Filled 2021-03-21: qty 60, 30d supply, fill #0

## 2021-03-21 MED ORDER — CLONAZEPAM 1 MG PO TABS
1.0000 mg | ORAL_TABLET | Freq: Two times a day (BID) | ORAL | 0 refills | Status: DC
  Filled 2021-03-21: qty 60, 30d supply, fill #0

## 2021-03-21 MED ORDER — HYDRALAZINE HCL 10 MG PO TABS
10.0000 mg | ORAL_TABLET | Freq: Three times a day (TID) | ORAL | 0 refills | Status: DC
  Filled 2021-03-21: qty 90, 30d supply, fill #0

## 2021-03-21 MED ORDER — HYDRALAZINE HCL 10 MG PO TABS
10.0000 mg | ORAL_TABLET | Freq: Three times a day (TID) | ORAL | Status: DC
  Administered 2021-03-21 – 2021-03-22 (×3): 10 mg via ORAL
  Filled 2021-03-21 (×3): qty 1

## 2021-03-21 MED ORDER — ACETAMINOPHEN 325 MG PO TABS
650.0000 mg | ORAL_TABLET | Freq: Four times a day (QID) | ORAL | Status: DC | PRN
Start: 2021-03-21 — End: 2021-08-28

## 2021-03-21 MED ORDER — PERAMPANEL 2 MG PO TABS
2.0000 mg | ORAL_TABLET | Freq: Every day | ORAL | 0 refills | Status: DC
  Filled 2021-03-21: qty 3, 3d supply, fill #0

## 2021-03-21 MED ORDER — LEVETIRACETAM 750 MG PO TABS
1500.0000 mg | ORAL_TABLET | Freq: Two times a day (BID) | ORAL | 0 refills | Status: DC
  Filled 2021-03-21: qty 120, 30d supply, fill #0

## 2021-03-21 MED ORDER — PHENYTOIN 50 MG PO CHEW
50.0000 mg | CHEWABLE_TABLET | Freq: Every day | ORAL | 0 refills | Status: DC
  Filled 2021-03-21: qty 3, 3d supply, fill #0

## 2021-03-21 NOTE — Progress Notes (Signed)
Patient ID: Arthur [name], male   DOB: 1960/12/18, 60 y.o.   MRN: [id number]  SW spoke with pt wife to provide updates from team conference, and d/c date remains 10/27. SW discussed [facility] preference. NO preference reported. SW shared challenges with obtaining HH due to insurance. She reports she is making efforts to be here tomorrow for family education. SW reminded her to call nurses station to get his schedule. SW informed will submit PCS referral; no home care agency preference.   *SW faxed PCS referral to [employer] [phone]).  Cecile Sheerer, MSW, LCSWA Office: [phone] Cell: [phone] Fax: [phone]

## 2021-03-21 NOTE — Patient Care Conference (Signed)
Inpatient RehabilitationTeam Conference and Plan of Care Update Date: 03/21/2021   Time: 10:44 AM    Patient Name: Arthur [name]      Medical Record Number: [id number]  Date of Birth: 1961-04-26 Sex: Male         Room/Bed: 4W12C/4W12C-02 Payor Info: Payor: MEDICAID [id number] / Plan: MEDICAID OF [place] / Product Type: *No Product type* /    Admit Date/Time:  03/11/2021  1:52 PM  Primary Diagnosis:  Encephalopathy acute  Hospital Problems: Principal Problem:   Encephalopathy acute Active Problems:   Controlled type 2 diabetes mellitus with hyperglycemia, without long-term current use of insulin (HCC)   Benign essential HTN    Expected Discharge Date: Expected Discharge Date: 03/23/21  Team Members Present: Physician leading conference: Dr. Maryla Morrow Social Worker Present: Cecile Sheerer, LCSWA Nurse Present: Kennyth Arnold, RN PT Present: Malachi Pro, PT OT Present: Kearney Hard, OT SLP Present: Eilene Ghazi, SLP PPS Coordinator present : Edson Snowball, PT     Current Status/Progress Goal Weekly Team Focus  Bowel/Bladder   Continent of B/B. Urinary urgency. LBM 10/24  Remain Continent      Swallow/Nutrition/ Hydration             ADL's   Supervision/CGA  Supervision  general strengthening, selfocare retraining, cognitive retraining   Mobility   independent bed mobility, supervision transfes, CGA gait x300'  supervision  family ed, DC prep   Communication   Mod A  Min A  word-finding, self-monitoring and correcting errors   Safety/Cognition/ Behavioral Observations  Mod A  Mod A  emergent awareness   Pain   Denies pain  Remain pain free.      Skin   Intact.  Maintain skin integrity.        Discharge Planning:  D/c to home with his wife who will provide 24/7 care. Fam edu pending if wife able to find support to watch her adult autistic son and transportation issues since she does not drive. SW to submit PCS referral for additional support at home. Waiting on d/c recs.    Team Discussion: Still having issues with confusion. Blood pressure medications adjusted. Continent B/B, needs assist with urinal to prevent spillage. No pain or sleep issues reported. Can be impulsive, tele-sitter provided for safety. Recommended that spouse bring in son so she can participate in family education. Referral for [facility] services. Outpatient at discharge would be great due to Medicaid. Therapy recommending HH due to Autistic child and lack of transportation.  Patient on target to meet rehab goals: yes, contact guard/supervision overall. Problem solving is not good. Has cognition and balance deficits. Mod assist for communication. Goals are min assist and mod assist for awareness.  *See Care Plan and progress notes for long and short-term goals.   Revisions to Treatment Plan:  MD adjusting medications.  Teaching Needs: Family education, medication management, safety awareness.  Current Barriers to Discharge: Decreased caregiver support, Home enviroment access/layout, Medication compliance, and Behavior  Possible Resolutions to Barriers: Family education, behavior management, continue current medications, monitor for constipation.      Medical Summary Current Status: Deficits in mobility, self-care, cognition secondary to seizures (+ benzos at admit, ? withdrawal, ETOH not checked) (left parietal region) and resulting encephalopathy  Barriers to Discharge: Medical stability;Decreased family/caregiver support;Behavior  Barriers to Discharge Comments: Telesitter Possible Resolutions to [employer] Focus: Therapies, pt and family edu, optimize BP meds, monitor for breakthrough seizures, optimize bowel meds   Continued Need for Acute Rehabilitation Level of Care:  The patient requires daily medical management by a physician with specialized training in physical medicine and rehabilitation for the following reasons: Direction of a multidisciplinary physical rehabilitation program  to maximize functional independence : Yes Medical management of patient stability for increased activity during participation in an intensive rehabilitation regime.: Yes Analysis of laboratory values and/or radiology reports with any subsequent need for medication adjustment and/or medical intervention. : Yes   I attest that I was present, lead the team conference, and concur with the assessment and plan of the team.   Tennis Must 03/21/2021, 1:53 PM

## 2021-03-21 NOTE — Progress Notes (Signed)
Physical Therapy Session Note  Patient Details  Name: Arthur [name] MRN: [id number] Date of Birth: [birth year]  Today's Date: 03/21/2021 PT Individual Time: 4132-4401 and 1315-1355 PT Individual Time Calculation (min): 75 min and 40 min  Short Term Goals: Week 1:  PT Short Term Goal 1 (Week 1): STG=LTG due to LOS  Skilled Therapeutic Interventions/Progress Updates:     Session 1: Patient in bed asleep upon PT arrival. Patient easily aroused and agreeable to PT session. Patient denied pain during session.  Resting BP at beginning of session 138/90. BP elevated with activity throughout session, required frequent sitting rest breaks to maintain BP <160 SPB and <110 DBP, BP elevated to 173/109 following stair training, recovered to parameters above in sitting before continuing with activity. RN made aware and assessing med management for hypertension at end of session.  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 Required multimodal cues to locate a chair x2 due to poor comprehension vs visual-spatial deficits. Able to attend in a busy gym environment >40 min.   Therapeutic Activity: Bed Mobility: Patient performed supine to sit with mod I for increased time in a flat bed without use of bed rails. Transfers: Patient performed sit to/from stand x12 with supervision-mod I without AD. Provided verbal cues for safety and spatial awareness when sitting x2.  Gait Training:  Patient ambulated >50 feet x2 and >150 feet x2 without AD with close supervision for safety. Ambulated with decreased gait speed, decreased step length, posterior bias with weight shifted into his heels, rigid head and trunk with decreased visual scanning, and downward head gaze. Provided verbal cues for looking ahead, increased gait  speed for increased step length, increased B arm swing and trunk rotation for improved balance with gait. Utilized a [employer] x50 feet on last gait trial with improved gait mechanics.  Patient ascended/descended 12x6" steps using B rails with close supervision. Performed reciprocal gait pattern. Provided cues for technique and sequencing.   Neuromuscular Re-ed: Patient performed the following balance assessments: -Patient demonstrates increased fall risk as noted by score of  39/56 on Berg Balance Scale.  (<36= high risk for falls, close to 100%; 37-45 significant >80%; 46-51 moderate >50%; 52-55 lower >25%). Score improved from 35/56 on 10/20. -Patient demonstrates increased fall risk as noted by score of 19 sec on Timed Up and Go. >12 sec indicates increased fall risk. Patient score improved from 49 sec on 10/16. -Patient demonstrates increased fall risk as noted by score of 13/30 on  Functional Gait Assessment.  (<19=increased fall risk with dynamic gait)  Educated patient on results and interpretation, fall prevention, 24/7 supervision, and energy conservation strategies. Patient attentive and stated understanding.  Patient in recliner set up for lunch at end of session with breaks locked, seat belt alarm set, and all needs within reach.   Session 2: Patient in bed with NT taking vitals upon PT arrival. Patient alert and agreeable to PT session. Patient denied pain during session.  Patient hypertensive throughout session, BP 155/102 at beginning of session. Maintained 150's/100's with basic transfers and standing. Ambulated 160 feet x1 min and reported dizziness and returned to sitting. BP 178/110. Performed squat pivot transfer to w/c and transported back to the room to perform squat pivot back to bed and return to supine. BP 154/101 in supine at end of session.  HR 70's-80's throughout. All mobility performed with close supervision without AD, cues and technique performed as  above. RN made aware of hypertension, reported meds were provided prior to session.   Patient in bed at end of session with breaks locked, bed alarm set, and all needs within reach.   Therapy Documentation Precautions:  Precautions Precautions: Fall Precaution Comments: profound cognitive deficits and impaired communication; seizures Restrictions Weight Bearing Restrictions: No  Balance: Balance Balance Assessed: Yes Standardized Balance Assessment Standardized Balance Assessment: Berg Balance Test;Timed Up and Go Test;Functional Gait Assessment Berg Balance Test Sit to Stand: Able to stand without using hands and stabilize independently Standing Unsupported: Able to stand safely 2 minutes Sitting with Back Unsupported but Feet Supported on Floor or Stool: Able to sit safely and securely 2 minutes Stand to Sit: Sits safely with minimal use of hands Transfers: Able to transfer safely, minor use of hands Standing Unsupported with Eyes Closed: Able to stand 10 seconds with supervision Standing Ubsupported with Feet Together: Able to place feet together independently and stand for 1 minute with supervision From Standing, Reach Forward with Outstretched Arm: Reaches forward but needs supervision From Standing Position, Pick up Object from Floor: Able to pick up shoe, needs supervision From Standing Position, Turn to Look Behind Over each Shoulder: Turn sideways only but maintains balance Turn 360 Degrees: Needs close supervision or verbal cueing Standing Unsupported, Alternately Place Feet on Step/Stool: Able to complete 4 steps without aid or supervision Standing Unsupported, One Foot in Front: Able to plae foot ahead of the other independently and hold 30 seconds Standing on One Leg: Tries to lift leg/unable to hold 3 seconds but remains standing independently Total Score: 39 Timed Up and Go Test TUG: Normal TUG Normal TUG (seconds): 19 Functional Gait  Assessment Gait Level  Surface: Walks 20 ft, slow speed, abnormal gait pattern, evidence for imbalance or deviates 10-15 in outside of the 12 in walkway width. Requires more than 7 sec to ambulate 20 ft. Change in Gait Speed: Able to change speed, demonstrates mild gait deviations, deviates 6-10 in outside of the 12 in walkway width, or no gait deviations, unable to achieve a major change in velocity, or uses a change in velocity, or uses an assistive device. Gait with Horizontal Head Turns: Performs head turns smoothly with slight change in gait velocity (eg, minor disruption to smooth gait path), deviates 6-10 in outside 12 in walkway width, or uses an assistive device. Gait with Vertical Head Turns: Performs task with moderate change in gait velocity, slows down, deviates 10-15 in outside 12 in walkway width but recovers, can continue to walk. Gait and Pivot Turn: Pivot turns safely in greater than 3 sec and stops with no loss of balance, or pivot turns safely within 3 sec and stops with mild imbalance, requires small steps to catch balance. Step Over Obstacle: Is able to step over one shoe box (4.5 in total height) without changing gait speed. No evidence of imbalance. Gait with Narrow Base of Support: Ambulates less than 4 steps heel to toe or cannot perform without assistance. Gait with Eyes Closed: Cannot walk 20 ft without assistance, severe gait deviations or imbalance, deviates greater than 15 in outside 12 in walkway width or will not attempt task. Ambulating Backwards: Walks 20 ft, slow speed, abnormal gait pattern, evidence for imbalance, deviates 10-15 in outside 12 in walkway width. Steps: Alternating feet, must use rail. Total Score: 13 FGA comment:: required multiple rest breaks between items to manage  hypertension    Therapy/Group: Individual Therapy  Chassie Pennix L Marily Konczal PT, DPT  03/21/2021, 3:56 PM

## 2021-03-21 NOTE — Progress Notes (Signed)
Arthur [name] PHYSICAL MEDICINE & REHABILITATION PROGRESS NOTE  Subjective/Complaints: Patient seen sitting up in bed this morning.  No reported issues overnight.  He remains confused.  ROS: Limited due to cognition  Objective: Vital Signs: Blood pressure (!) 152/99, pulse 81, temperature 98.1 F (36.7 C), temperature source Oral, resp. rate 17, height 5\' 3"  (1.6 m), weight 67.8 kg, SpO2 99 %. No results found. Recent Labs    03/20/21 0540  WBC 6.3  HGB 13.2  HCT 40.9  PLT 380     Recent Labs    03/20/21 0540  NA 137  K 4.0  CL 103  CO2 26  GLUCOSE 100*  BUN 11  CREATININE 1.04  CALCIUM 9.2     Intake/Output Summary (Last 24 hours) at 03/21/2021 0920 Last data filed at 03/21/2021 0825 Gross per 24 hour  Intake 480 ml  Output 1000 ml  Net -520 ml          Physical Exam: BP (!) 152/99 (BP Location: Right Arm)   Pulse 81   Temp 98.1 F (36.7 C) (Oral)   Resp 17   Ht 5\' 3"  (1.6 m)   Wt 67.8 kg   SpO2 99%   BMI 26.48 kg/m  Constitutional: No distress . Vital signs reviewed. HENT: Normocephalic.  Atraumatic. Eyes: EOMI. No discharge. Cardiovascular: No JVD.  RRR. Respiratory: Normal effort.  No stridor.  Bilateral clear to auscultation. GI: Non-distended.  BS +. Skin: Warm and dry.  Intact. Psych: Slowed. Confused.  Musc: No edema in extremities.  No tenderness in extremities. Neuro: Alert and oriented x1 Motor: 4/5 throughout, appears unchanged  Assessment/Plan: 1. Functional deficits which require 3+ hours per day of interdisciplinary therapy in a comprehensive inpatient rehab setting. Physiatrist is providing close team supervision and 24 hour management of active medical problems listed below. Physiatrist and rehab team continue to assess barriers to discharge/monitor patient progress toward functional and medical goals   Care Tool:  Bathing    Body parts bathed by patient: Right arm, Left arm, Chest, Abdomen, Front perineal area, Buttocks,  Right upper leg, Left upper leg, Right lower leg, Left lower leg, Face         Bathing assist Assist Level: Supervision/Verbal cueing     Upper Body Dressing/Undressing Upper body dressing   What is the patient wearing?: Pull over shirt    Upper body assist Assist Level: Set up assist    Lower Body Dressing/Undressing Lower body dressing      What is the patient wearing?: Pants     Lower body assist Assist for lower body dressing: Supervision/Verbal cueing     Toileting Toileting Toileting Activity did not occur (Clothing management and hygiene only): N/A (no void or bm)  Toileting assist Assist for toileting: Contact Guard/Touching assist     Transfers Chair/bed transfer  Transfers assist     Chair/bed transfer assist level: Supervision/Verbal cueing     Locomotion Ambulation   Ambulation assist      Assist level: Contact Guard/Touching assist Assistive device: No Device Max distance: >200   Walk 10 feet activity   Assist     Assist level: Contact Guard/Touching assist Assistive device: No Device   Walk 50 feet activity   Assist    Assist level: Contact Guard/Touching assist Assistive device: No Device    Walk 150 feet activity   Assist Walk 150 feet activity did not occur: Safety/medical concerns (fatigue, weakness, decreased balance)  Assist level: Contact Guard/Touching assist Assistive device: No  Device    Walk 10 feet on uneven surface  activity   Assist     Assist level: Minimal Assistance - Patient > 75% Assistive device: Other (comment) (handrail)   Wheelchair     Assist Is the patient using a wheelchair?: Yes Type of Wheelchair: Manual    Wheelchair assist level: Supervision/Verbal cueing Max wheelchair distance: 61ft    Wheelchair 50 feet with 2 turns activity    Assist        Assist Level: Maximal Assistance - Patient 25 - 49% (per PT note)   Wheelchair 150 feet activity     Assist       Assist Level: Total Assistance - Patient < 25% (per PT note)  BP (!) 152/99 (BP Location: Right Arm)   Pulse 81   Temp 98.1 F (36.7 C) (Oral)   Resp 17   Ht 5\' 3"  (1.6 m)   Wt 67.8 kg   SpO2 99%   BMI 26.48 kg/m    Medical Problem List and Plan: 1.  Deficits in mobility, self-care, cognition secondary to seizures (+ benzos at admit, ? withdrawal, ETOH not checked) (left parietal region) and resulting encephalopathy.  Cont to monitor  Team conference today to discuss current and goals and coordination of care, home and environmental barriers, and discharge planning with nursing, case manager, and therapies. Please see conference note from today as well.  2.  Impaired mobility   Mechanical: Sequential compression devices, below knee Bilateral lower extremities Pharmaceutical: continue Lovenox              -antiplatelet therapy: ASA 81 3. Pain Management: As needed meds             Team support 4.  Mood: Klonopin 1 mg twice daily for anxiety             -antipsychotic agents:N/A 5. Neuropsych: This patient is not capable of making decisions on his own behalf.  Telesitter for safety 6. Skin/Wound Care: Routine skin care 7. Fluids/Electrolytes/Nutrition:              encourage PO -low albumin--protein supp 8.  Essential hypertension             Norvasc 10, clonidine 0.1 mg TID, Lisinopril increased to 20mg  BID  Hydralazine scheduled started on 10/25 Vitals:   03/21/21 0552 03/21/21 0824  BP: (!) 161/107 (!) 152/99  Pulse: 74 81  Resp: 16 17  Temp: 98.2 F (36.8 C) 98.1 F (36.7 C)  SpO2: 100% 99%   9.  Seizures Perampanel 4 mg x 5 days, then 2 mg x 10 days Phenytoin taper completed Vimpat 200 mg twice daily             Keppra 1500 mg twice daily no breakthrough seizures from admission to rehab - 10/25 10.  Diabetes mellitus type 2 with hyperglycemia              Metformin 500 mg twice daily PTA             CBG (last 3)  Recent Labs    03/20/21 1654  03/20/21 2158 03/21/21 0623  GLUCAP 102* 125* 91     Relatively controlled on 10/25 11.  Hyponatremia             resolved   -reduce FR to 1200 cc BMP Latest Ref Rng & Units 03/20/2021 03/16/2021 03/15/2021  Glucose 70 - 99 mg/dL 03/18/2021) 84 03/17/2021)  BUN 6 - 20 mg/dL 11 14  13  Creatinine 0.61 - 1.24 mg/dL 6.62 9.47 6.54  BUN/Creat Ratio 9 - 20 - - -  Sodium 135 - 145 mmol/L 137 136 128(L)  Potassium 3.5 - 5.1 mmol/L 4.0 4.4 4.1  Chloride 98 - 111 mmol/L 103 104 98  CO2 22 - 32 mmol/L 26 25 23   Calcium 8.9 - 10.3 mg/dL 9.2 9.0 )    12. Constipation:   Improving  LOS: 10 days A FACE TO FACE EVALUATION WAS PERFORMED  Davit Vassar 6.5(K 03/21/2021, 9:20 AM

## 2021-03-21 NOTE — Progress Notes (Signed)
Occupational Therapy Session Note  Patient Details  Name: Arthur [name] MRN: [id number] Date of Birth: 05/23/1961  Today's Date: 03/21/2021 OT Individual Time: 4287-6811 OT Individual Time Calculation (min): 58 min    Short Term Goals: Week 1:  OT Short Term Goal 1 (Week 1): STGs=LTGs due to ELOS  Skilled Therapeutic Interventions/Progress Updates:    Pt greeted semi-reclined in bed and agreeable to OT treatment session. Pt agreeable to shower today but stated he did not need to use the bathroom. Ambulation in room to bathroom with close supervision overall. Verbal cues for safety awareness to sit down to doff pants. Bathing completed sit<>stand from tub bench with overall supervision and min cues for safety. Pt then ambulated out of bathroom in similar fashion and performed dressing tasks at EOB. Pt donned deodorant appropriately, then when applying lotion, perseverating on placing lotion on arms and needed moderate cues to move on to next body part. Supervision for dressing. Standing balance/endurance with standing grooming tasks and min cues to locate items. Pt then ambulated to therapy gym with moderate cues for pathfinding, no AD and close supervision. Addressed problem solving and fine motor coordination with 2 color peg board task. Pt was able to place 3 pegs in alternating color pattern, then got stuck and perseverating on only pulling blue pegs. OT graded task by handing pt the correct color, but pt still had difficulty processing and planning 2 color pattern in horizontal line. Pt ambulated back to room with close supervision and left semir-reclined in bed with needs met.   Therapy Documentation Precautions:  Precautions Precautions: Fall Precaution Comments: profound cognitive deficits and impaired communication; seizures Restrictions Weight Bearing Restrictions: No Pain: Pain Assessment Pain Scale: 0-10 Pain Score: 0-No pain   Therapy/Group: Individual Therapy  Valma Cava 03/21/2021, 9:33 AM

## 2021-03-21 NOTE — Progress Notes (Signed)
Speech Language Pathology Daily Session Note  Patient Details  Name: JONAVIN SEDER MRN: [id number] Date of Birth: 1961-04-16  Today's Date: 03/21/2021 SLP Individual Time: 1005-1030 SLP Individual Time Calculation (min): 25 min  Short Term Goals: Week 1: SLP Short Term Goal 1 (Week 1): STGs=LTGs due to ELOS  Skilled Therapeutic Interventions: Pt seen for skilled ST with focus on cognitive communication skills. SLP facilitating simple naming task by providing mod A cues for 50% accuracy.  Pt appears more confused than previous tx session, requiring repetition of prompts and more than reasonable amount of time to comprehend and respond throughout. Pt oriented to time and biographical concepts with min A verbal and visual cues. Simple yes/no questions with 70% accuracy provided min A. Continue to recommend 24/7 supervision at d/c due to cognitive and language impairments. Pt left in bed with alarm set and all needs within reach. Cont ST POC.   Pain Pain Assessment Pain Scale: 0-10 Pain Score: 0-No pain  Therapy/Group: Individual Therapy  Tacey Ruiz 03/21/2021, 11:12 AM

## 2021-03-22 ENCOUNTER — Other Ambulatory Visit (HOSPITAL_COMMUNITY): Payer: Self-pay

## 2021-03-22 DIAGNOSIS — E119 Type 2 diabetes mellitus without complications: Secondary | ICD-10-CM

## 2021-03-22 LAB — GLUCOSE, CAPILLARY
Glucose-Capillary: 97 mg/dL (ref 70–99)
Glucose-Capillary: 101 mg/dL — ABNORMAL HIGH (ref 70–99)
Glucose-Capillary: 92 mg/dL (ref 70–99)
Glucose-Capillary: 105 mg/dL — ABNORMAL HIGH (ref 70–99)

## 2021-03-22 MED ORDER — ISOSORBIDE DINITRATE 30 MG PO TABS
30.0000 mg | ORAL_TABLET | Freq: Three times a day (TID) | ORAL | Status: DC
  Administered 2021-03-22 – 2021-03-24 (×5): 30 mg via ORAL
  Filled 2021-03-22 (×6): qty 1

## 2021-03-22 MED ORDER — HYDRALAZINE HCL 25 MG PO TABS
25.0000 mg | ORAL_TABLET | Freq: Three times a day (TID) | ORAL | Status: DC
  Administered 2021-03-22 – 2021-03-23 (×3): 25 mg via ORAL
  Filled 2021-03-22 (×3): qty 1

## 2021-03-22 MED ORDER — CHLORTHALIDONE 25 MG PO TABS
25.0000 mg | ORAL_TABLET | ORAL | Status: DC
  Administered 2021-03-22 – 2021-03-23 (×2): 25 mg via ORAL
  Filled 2021-03-22 (×2): qty 1

## 2021-03-22 MED ORDER — HYDRALAZINE HCL 25 MG PO TABS
25.0000 mg | ORAL_TABLET | Freq: Three times a day (TID) | ORAL | 0 refills | Status: DC
  Filled 2021-03-22: qty 90, 30d supply, fill #0

## 2021-03-22 MED ORDER — CLONIDINE HCL 0.2 MG/24HR TD PTWK
0.2000 mg | MEDICATED_PATCH | TRANSDERMAL | Status: DC
  Administered 2021-03-22: 0.2 mg via TRANSDERMAL
  Filled 2021-03-22: qty 1

## 2021-03-22 MED ORDER — HYDRALAZINE HCL 25 MG PO TABS: 25.0000 mg | ORAL_TABLET | Freq: Three times a day (TID) | ORAL | Status: DC

## 2021-03-22 NOTE — Consult Note (Addendum)
CARDIOLOGY CONSULT NOTE  Patient ID: Arthur [name] MRN: [id number] DOB/AGE: [birth year] 60 y.o.  Admit date: 03/11/2021 Referring Physician Maryla Morrow, MD Primary Physician:  [name], MD Reason for Consultation management of hypertension  Patient ID: Arthur [name], male    DOB: 03/29/1961, 60 y.o.   MRN: [id number]  No chief complaint on file.  HPI:    Arthur [name]  is a 60 y.o. African-American male patient with history of seizure disorder, primary hypertension, pre-pre-diabetes mellitus, admitted to the hospital with altered mental status secondary to seizure disorder and also history of excessive alcohol use, does not appear that he was drinking when he presented.  He was admitted initially on 02/24/2021 and discharged to rehab on 03/11/2021 as he had difficulty in doing and maintaining his activities of daily living.  He was being managed with multiple medications while in the hospital, is presently in rehab, plan is for him to be discharged home tomorrow, because of elevated blood pressure was consulted to recommend medications.  Patient appeared to be alert and oriented and was able to give me history, states that he is doing well and denies any chest pain or shortness of breath.  States that he had been drinking heavily at home.  He was also smoking cigarettes.  States that he does not want to do this again.  Past Medical History:  Diagnosis Date   DM2 (diabetes mellitus, type 2) (HCC)    ETOH abuse    Hypertension    Seizures (HCC)    Past Surgical History:  Procedure Laterality Date   HEMORRHOID SURGERY     Social History   Tobacco Use   Smoking status: Every Day    Types: Cigars   Smokeless tobacco: Never   Tobacco comments:    Smokes black& mild - 1 a day.  Substance Use Topics   Alcohol use: Not Currently    Family History  Problem Relation Age of Onset   Diabetes Mother    Hypertension Mother    Diabetes Father    Hypertension Father    Hypertension Sister      Marital Status: Married  ROS  Review of Systems  Cardiovascular:  Negative for chest pain, dyspnea on exertion and leg swelling.  Gastrointestinal:  Negative for melena.  Neurological:  Positive for disturbances in coordination, dizziness and loss of balance.  Psychiatric/Behavioral:  Negative for altered mental status.   All other systems reviewed and are negative. Objective   Vitals with BMI 03/22/2021 03/22/2021 03/22/2021  Height - - -  Weight - - -  BMI - - -  Systolic 171 175 034  Diastolic 105 105 742  Pulse - - 74    Blood pressure (!) 171/105, pulse 74, temperature 97.7 F (36.5 C), temperature source Oral, resp. rate 18, height 5\' 3"  (1.6 m), weight 67.8 kg, SpO2 100 %.    Physical Exam Constitutional:      Appearance: Normal appearance.  Eyes:     Extraocular Movements: Extraocular movements intact.  Neck:     Vascular: No carotid bruit or JVD.  Cardiovascular:     Rate and Rhythm: Normal rate and regular rhythm.     Pulses: Intact distal pulses.     Heart sounds: Normal heart sounds. No murmur heard.   No gallop.  Pulmonary:     Effort: Pulmonary effort is normal.     Breath sounds: Normal breath sounds.  Abdominal:     General: Bowel sounds are normal.  Palpations: Abdomen is soft.  Musculoskeletal:        General: No swelling.  Skin:    General: Skin is warm.     Capillary Refill: Capillary refill takes less than 2 seconds.  Neurological:     General: No focal deficit present.  Psychiatric:        Mood and Affect: Mood normal.   Laboratory examination:   Recent Labs    03/15/21 0539 03/16/21 0501 03/20/21 0540  NA 128* 136 137  K 4.1 4.4 4.0  CL 98 104 103  CO2 23 25 26   GLUCOSE 101* 84 100*  BUN 13 14 11   CREATININE 1.07 1.19 1.04  CALCIUM 8.7* 9.0 9.2  GFRNONAA >60 >60 >60   estimated creatinine clearance is 62.3 mL/min (by C-G formula based on SCr of 1.04 mg/dL).  CMP Latest Ref Rng & Units 03/20/2021 03/16/2021 03/15/2021   Glucose 70 - 99 mg/dL 03/18/2021) 84 03/17/2021)  BUN 6 - 20 mg/dL 11 14 13   Creatinine 0.61 - 1.24 mg/dL 443(X 540(G  Sodium 135 - 145 mmol/L 137 136 128(L)  Potassium 3.5 - 5.1 mmol/L 4.0 4.4 4.1  Chloride 98 - 111 mmol/L 103 104 98  CO2 22 - 32 mmol/L 26 25 23   Calcium 8.9 - 10.3 mg/dL 9.2 9.0 8.67)  Total Protein 6.5 - 8.1 g/dL - - 7.0  Total Bilirubin 0.3 - 1.2 mg/dL - - 6.19)  Alkaline Phos 38 - 126 U/L - - 116  AST 15 - 41 U/L - - 31  ALT 0 - 44 U/L - - 44   CBC Latest Ref Rng & Units 03/20/2021 03/13/2021 02/27/2021  WBC 4.0 - 10.5 K/uL 6.3 5.4 7.9  Hemoglobin 13.0 - 17.0 g/dL 7.1(I 03/22/2021 03/15/2021  Hematocrit 39.0 - 52.0 % 40.9 39.1 42.7  Platelets 150 - 400 K/uL 380 349 233   Lipid Panel Recent Labs    10/17/20 0151 10/18/20 0735  CHOL 217*  --   TRIG 480* 485*  LDLCALC UNABLE TO CALCULATE IF TRIGLYCERIDE OVER 400 mg/dL  --   VLDL UNABLE TO CALCULATE IF TRIGLYCERIDE OVER 400 mg/dL  --   HDL 09.9*  --   CHOLHDL NOT CALCULATED  --   LDLDIRECT 124.3*  --     HEMOGLOBIN A1C Lab Results  Component Value Date   HGBA1C 6.0 (H) 02/27/2021   MPG 125.5 02/27/2021   TSH Recent Labs    02/27/21 0244  TSH 1.377   BNP (last 3 results) Recent Labs    03/02/21 0312  BNP 77.6    Cardiac Panel (last 3 results) No results for input(s): CKTOTAL, CKMB, TROPONINIHS, RELINDX in the last 72 hours.   Medications and allergies   Allergies  Allergen Reactions   Penicillins Hives    Did it involve swelling of the face/tongue/throat, SOB, or low BP? Y Did it involve sudden or severe rash/hives, skin peeling, or any reaction on the inside of your mouth or nose? N Did you need to seek medical attention at a hospital or doctor's office? Y When did it last happen?  Over 5 Years Ago     If all above answers are "NO", may proceed with cephalosporin use.      No outpatient medications have been marked as taking for the 03/11/21 encounter [facility] Encounter).    Scheduled Meds:   amLODipine  10 mg Oral Daily   aspirin EC  81 mg Oral Daily   chlorthalidone  25 mg Oral BH-q7a  clonazePAM  1 mg Oral BID   cloNIDine  0.2 mg Transdermal Weekly   enoxaparin (LOVENOX) injection  40 mg Subcutaneous Q24H   hydrALAZINE  25 mg Oral Q8H   isosorbide dinitrate  30 mg Oral TID   lacosamide  200 mg Oral BID   levETIRAcetam  1,500 mg Oral BID   lisinopril  20 mg Oral BID   perampanel  2 mg Oral QHS   phenytoin  50 mg Oral Daily   senna  2 tablet Oral Daily   Continuous Infusions: PRN Meds:.acetaminophen **OR** acetaminophen, hydrALAZINE, HYDROcodone-acetaminophen, methocarbamol, midazolam, oxyCODONE   I/O last 3 completed shifts: In: 780 [P.O.:780] Out: 300 [Urine:300] Total I/O In: 330 [P.O.:330] Out: -     Radiology:   Chest x-ray single view 02/24/2021: Decreased lung volumes are seen with mild areas of atelectasis noted within the bilateral lung bases. There is no evidence of a pleural effusion or pneumothorax. The cardiac silhouette is moderately enlarged. This may be technical in origin. The visualized skeletal structures are unremarkable.   IMPRESSION: Low lung volumes with mild bibasilar atelectasis.  Cardiac Studies:   Echocardiogram 10/17/2020: 1. Left ventricular ejection fraction, by estimation, is 45 to 50%. The left ventricle has mildly decreased function. The left ventricle demonstrates global hypokinesis. There is moderate left ventricular hypertrophy. Left ventricular diastolic  parameters are consistent with Grade I diastolic dysfunction (impaired relaxation).  2. Right ventricular systolic function is mildly reduced. The right ventricular size is normal. Tricuspid regurgitation signal is inadequate for assessing PA pressure.  3. The mitral valve is normal in structure. No evidence of mitral valve regurgitation. No evidence of mitral stenosis.  4. The aortic valve is tricuspid. Aortic valve regurgitation is not visualized. Mild aortic valve  sclerosis is present, with no evidence of aortic valve stenosis.  5. The inferior vena cava is normal in size with greater than 50% respiratory variability, suggesting right atrial pressure of 3 mmHg.  6. Technically difficult study with poor acoustic windows.  Compared to 10/03/2019, EF was reduced from normal 55 to 60%.  EKG 02/24/2021: Normal sinus rhythm at rate of 97 bpm, biatrial enlargement, baseline artifact, nonspecific T abnormality.  Assessment   THAYDEN LEMIRE is a 60 y.o. African-American male patient with history of seizure disorder, primary hypertension, pre-pre-diabetes mellitus, admitted to the hospital with altered mental status secondary to seizure disorder and also history of excessive alcohol use, does not appear that he was drinking when he presented.  He was admitted initially on 02/24/2021 and discharged to rehab on 03/11/2021 as he had difficulty in doing and maintaining his activities of daily living.  1.  Difficult to control hypertension 2.  Mixed hyperlipidemia 3.  Encephalopathy secondary to seizure disorder 4.  Prediabetes mellitus 5.  Cardiomyopathy, he does not have any recent echo but this can be performed in the outpatient basis.  Recommendations:   Discontinue clonidine p.o., switch him to transdermal TTS 0.2 mg patch q. weekly.  Add chlorthalidone 25 mg in the morning, will administer 1 dose tonight.  Check BMP tomorrow and again in 2 weeks.  We will add isosorbide dinitrate 25 mg p.o. 3 times daily and continue hydralazine 50 mg 3 times daily.  This should get his blood pressure under control.  Underlying cardiomyopathy without heart failure could be related to difficult to control hypertension versus if alcoholism is truly a factor, may be contributing to this as well.  He will need repeat echocardiogram.  His triglycerides are markedly elevated, will  repeat fasting lipids in the morning, test lipid profile in the morning as well.   There is no clinical evidence  of heart failure, today when I spoke to him he appeared to be appropriate.   Yates Decamp, MD, [facility] 03/22/2021, 5:26 PM Office: [phone]

## 2021-03-22 NOTE — Progress Notes (Signed)
MD Allena Katz notified of pt blood pressure after hydralazine. No new orders at this time. Mylo Red, LPN

## 2021-03-22 NOTE — Progress Notes (Signed)
Speech Language Pathology Daily Session Note  Patient Details  Name: Arthur [name] MRN: [id number] Date of Birth: 03-Aug-1960  Today's Date: 03/22/2021 SLP Individual Time: 0725-0825 SLP Individual Time Calculation (min): 60 min  Short Term Goals: Week 1: SLP Short Term Goal 1 (Week 1): STGs=LTGs due to ELOS  Skilled Therapeutic Interventions: Skilled treatment session focused on communication goals. SLP facilitated session by administering the [place] Aphasia Screening Test (MAST). Patient scored 10/10 points in naming, 10/10 points in automatic speech tasks, 0/10 points in repetition, 18/20 points for yes/no responses, 10/10 points in object recognition, 2/10 points for following instructions , 0/10 points for reading instructions, verbal fluency and writing. Combined scores are 20/50 points for expressio and 30/50 points for comprehension. Patient appeared to demonstrate increased difficulty today, BP taken :184/118. RN made aware and administered medications. Patient requested to use the bathroom and was continent of urine. Patient transferred back to bed from the recliner at end of session per his request. Patient left upright in bed with alarm on and all needs within reach. Continue with current plan of care.      Pain Pain Assessment Pain Scale: 0-10 Pain Score: 0-No pain  Therapy/Group: Individual Therapy  Ilija Maxim 03/22/2021, 9:34 AM

## 2021-03-22 NOTE — Progress Notes (Addendum)
Inpatient Rehabilitation Discharge Medication Review by a Pharmacist  A complete drug regimen review was completed for this patient to identify any potential clinically significant medication issues.  High Risk Drug Classes Is patient taking? Indication by Medication  Antipsychotic No   Anticoagulant No   Antibiotic No   Opioid Yes Norco prn moderate pain  Antiplatelet Yes Aspirin 81 mg: antiplatelet  Hypoglycemics/insulin Yes Metformin: glucose control  Vasoactive Medication Yes Amlodipine, clonidine, hydralazine, and lisinopril for blood pressure  Chemotherapy No   Other Yes Keppra, Vimpat and Clonazepam for seizure control. Tapering off Dilantin and Fycompa per Neuro plan. MVI: supplement Senokot: laxative Prn methocarbamol for muscle spasms, Tylenol prn mild pain     Type of Medication Issue Identified Description of Issue Recommendation(s)  Drug Interaction(s) (clinically significant)     Duplicate Therapy     Allergy     No Medication Administration End Date     Incorrect Dose     Additional Drug Therapy Needed     Significant med changes from prior encounter (inform family/care partners about these prior to discharge).    Other       Clinically significant medication issues were identified that warrant physician communication and completion of prescribed/recommended actions by midnight of the next day:  No  Pharmacist comments:  Dilantin and Fycompa have been tapering as inpatient, to finish taper after 3 more daily doses.  Time spent performing this drug regimen review (minutes):  20   Dennie Fetters, [place] 03/22/2021 3:43 PM  Addendum:  Discharge delayed.    Med changes since 10/26:  Clonidine tablets to patch and Isosorbide dinitrate added on 10/26 pm, Hydralazine dose increased again on 10/27.  Final discharge orders reflect changes.   Dennie Fetters, RPh 03/24/2021 10:14 AM

## 2021-03-22 NOTE — Progress Notes (Addendum)
Patient ID: BARUCH LEWERS, male   DOB: 25-Jul-1960, 60 y.o.   MRN: [id number]  SW spoke with French Ana with [employer] [phone]) to confirm referral received, and will have nursing follow-up to complete phone assessment.   SW returned phone call/left message for [employer] [phone]) to complete phone assessment and waiting on follow-up.  *referral completed with [facility]  SW sent out HHPT/OT/SLP/SW referral to [facility] care and waiting on follow-up.  *referral declined due to staffing.   SW sent referral to Royanne Foots [facility] and referral was declined.   SW sent referral to Stacie[facility] and waiting on follow-up. *referral declined due to staffing.  SW sent referral to Cory[facility] and waiting on follow-up.   Declined HHAs [facility]  Cecile Sheerer, MSW, [place] Office: [phone] Cell: [phone] Fax: [phone]

## 2021-03-22 NOTE — Progress Notes (Addendum)
Occupational Therapy Discharge Summary  Patient Details  Name: Arthur [name] MRN: [id number] Date of Birth: [birth year]   Patient has met 10 of 10 long term goals due to improved activity tolerance, improved balance, postural control, improved attention, improved awareness, and improved coordination.  Patient to discharge at overall Supervision level.  Patient's care partner is independent to provide the necessary cognitive assistance at discharge.  Pt continues to be limited by HTN and OT educated spouse on alternative strategies for management, monitoring and energy conservation via phone and handout.  Reasons goals not met: n/a  Recommendation:  Patient will benefit from ongoing skilled OT services in home health setting to continue to advance functional skills in the area of BADL, iADL, and Reduce care partner burden.  Equipment: No equipment provided  Reasons for discharge: treatment goals met and discharge from hospital  Patient/family agrees with progress made and goals achieved: Yes  OT Discharge Precautions/Restrictions    General   Vital Signs Therapy Vitals BP: (!) 131/96 Pain Pain Assessment Pain Scale: 0-10 Pain Score: 0-No pain ADL ADL Eating: Not assessed Grooming: Supervision/safety Where Assessed-Grooming: Standing at sink Upper Body Bathing: Supervision/safety Where Assessed-Upper Body Bathing: Sitting at sink Lower Body Bathing: Supervision/safety Where Assessed-Lower Body Bathing: Sitting at sink Upper Body Dressing: Supervision/safety Where Assessed-Upper Body Dressing: Chair Lower Body Dressing: Supervision/safety Where Assessed-Lower Body Dressing: Chair Toileting: Supervision/safety Where Assessed-Toileting: Glass blower/designer: Close supervision Toilet Transfer Method: Counselling psychologist: Energy manager: Close supervison Clinical cytogeneticist Method: Magazine features editor: Pension scheme manager Method: Ambulating (without AD) Youth worker: Radio broadcast assistant, Grab bars Vision Baseline Vision/History: 0 No visual deficits Additional Comments: impaired cognition still limiting formal assessment- pt able to functionally scan for needed items wiht increased time Perception  Perception: Within Functional Limits Praxis Praxis: Impaired Praxis Impairment Details: Perseveration Praxis-Other Comments: 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 Day of the week incorrect Immediate Memory Recall: Sock;Blue;Bed Memory Recall Sock: Not able to recall Memory Recall Blue: Not able to recall Memory Recall Bed: Not able to recall Awareness: Impaired Awareness Impairment: Intellectual impairment Problem Solving: Impaired Safety/Judgment: Impaired Comments: Impaired safety awareness and insight into deficits; mild impulsivity Sensation Sensation Light Touch: Appears Intact Coordination Gross Motor Movements are Fluid and Coordinated: No Fine Motor Movements are Fluid and Coordinated: No Coordination and Movement Description: grossly uncoordinated due to impaired sequencing/motor planning, pt with small movements overall unless in familiar functional task Motor  Motor Motor: Abnormal postural alignment and control Motor - Skilled Clinical Observations: post bias resolved Mobility  Bed Mobility Rolling Left: Independent with assistive device Supine to Sit: Independent with assistive device Sit to Supine: Independent with assistive device Transfers Sit to Stand: Supervision/Verbal cueing Stand to Sit: Supervision/Verbal cueing  Trunk/Postural Assessment  Cervical Assessment Cervical  Assessment: Within Functional Limits Thoracic Assessment Thoracic Assessment: Within Functional Limits Lumbar Assessment Lumbar Assessment: Exceptions to [facility] (post pelvic tilt) Postural Control Postural Control: Deficits on evaluation  Balance Balance Balance Assessed: Yes Static Sitting Balance Static Sitting - Level of Assistance: 6: Modified independent (Device/Increase time) Dynamic Sitting Balance Dynamic Sitting - Level of Assistance: 5: Stand by assistance Static Standing Balance Static Standing - Level of Assistance: 5: Stand by assistance Dynamic Standing Balance Dynamic Standing - Level of Assistance:  5: Stand by assistance Extremity/Trunk Assessment RUE Assessment RUE Assessment: Within Functional Limits Active Range of Motion (AROM) Comments: WFL, mild shoulder restrictions in overhead ranges but did not limit him functionally LUE Assessment LUE Assessment: Within Functional Limits Active Range of Motion (AROM) Comments: WFL, mild shoulder restrictions in overhead ranges but did not limit him functionally   Arthur [name] 03/22/2021, 10:52 AM

## 2021-03-22 NOTE — Progress Notes (Signed)
Physical Therapy Discharge Summary  Patient Details  Name: Arthur [name] MRN: [id number] Date of Birth: August 24, 1960  Today's Date: 03/22/2021 PT Individual Time: 1430-1500 PT Individual Time Calculation (min): 30 min  and Today's Date: 03/22/2021 PT Missed Time: 30 Minutes Missed Time Reason: Other (Comment) (Symptomatic HTN)   Patient has met 7 of 8 long term goals due to improved activity tolerance, improved balance, and improved postural control.  Patient to discharge at an ambulatory level Supervision.   Patient's care partner  spoke to therapy team over phone regarding care following discharge and is able  to provide the necessary cognitive assistance at discharge.  Reasons goals not met: Pt utilizes handrails for stair training, but is safe for stair navigation with HHA upon discharge.  Recommendation:  Patient will benefit from ongoing skilled PT services in home health setting to continue to advance safe functional mobility, address ongoing impairments in balance, ambulation, safety awareness, and minimize fall risk.  Equipment: No equipment provided  Reasons for discharge: discharge from hospital  Patient/family agrees with progress made and goals achieved: Yes  Skilled Therapeutic Interventions: Pt received supine in bed and agrees to therapy. No complaint of pain. PT takes pt's blood pressure in supine at 180/108. Pt reports feeling slightly "woozy" but attributes this to BP medication he had recently taken. PT educates on monitoring signs of hypertension and pt agreeable to light activity. Pt performs supine<>sit independently. Sit to stand with supervision and verbal cues for initiation. Pt ambulates x300' with cues to maintain upright gaze to improve posture and balance, increasing bilateral stride length and gait speed to decrease risk for falls. Pt completes x12 6" steps with close supervision and cues for use of handrails, and pt performs with reciprocal stepping pattern. Pt  sits and has BP taken again at 168/118, and pt again verbalizes feeling woozy. Pt ambulates back to room and returns to supine due to rising diastolic BP. Pt left supine with alarm intact and all needs within reach.  PT Discharge Precautions/Restrictions Precautions Precautions: Fall Precaution Comments: profound cognitive deficits and impaired communication; seizures Restrictions Weight Bearing Restrictions: No Pain Pain Assessment Pain Scale: 0-10 Pain Score: 0-No pain Pain Interference Pain Interference Pain Effect on Sleep: 1. Rarely or not at all Pain Interference with Therapy Activities: 1. Rarely or not at all Pain Interference with Day-to-Day Activities: 1. Rarely or not at all Vision/Perception  Perception Perception: Within Functional Limits Praxis Praxis: Impaired Praxis Impairment Details: Perseveration Praxis-Other Comments: improved from eval but with perseverative tendencies  Cognition Overall Cognitive Status: Impaired/Different from baseline Arousal/Alertness: Awake/alert Orientation Level: Oriented to person Memory: Impaired Memory Impairment: Decreased short term memory;Decreased recall of new information Safety/Judgment: Impaired Comments: Impaired safety awareness and insight into deficits; mild impulsivity Sensation Sensation Light Touch: Appears Intact Proprioception: Impaired by gross assessment Coordination Gross Motor Movements are Fluid and Coordinated: No Fine Motor Movements are Fluid and Coordinated: No Coordination and Movement Description: grossly uncoordinated due to impaired sequencing/motor planning, pt with small movements overall unless in familiar functional task Motor  Motor Motor: Abnormal postural alignment and control Motor - Skilled Clinical Observations: posterior bias resolved  Mobility Bed Mobility Bed Mobility: Supine to Sit;Sit to Supine Supine to Sit: Independent Sit to Supine: Independent Transfers Transfers: Sit to  Stand;Stand to Sit;Stand Pivot Transfers Sit to Stand: Supervision/Verbal cueing Stand to Sit: Supervision/Verbal cueing Stand Pivot Transfers: Supervision/Verbal cueing Transfer (Assistive device): None Locomotion  Gait Ambulation: Yes Gait Assistance: Supervision/Verbal cueing Gait Distance (Feet): 300 Feet Assistive  device: None Gait Assistance Details: Verbal cues for technique;Verbal cues for gait pattern Gait Gait: Yes Gait Pattern: Impaired Gait Pattern: Decreased trunk rotation;Decreased step length - right;Decreased step length - left Gait velocity: decreased Stairs / Additional Locomotion Stairs: Yes Stairs Assistance: Supervision/Verbal cueing Stair Management Technique: Two rails Number of Stairs: 12 Height of Stairs: 6 Ramp: Contact Guard/touching assist Curb: Supervision/Verbal cueing Wheelchair Mobility Wheelchair Mobility: No  Trunk/Postural Assessment  Cervical Assessment Cervical Assessment: Within Functional Limits Thoracic Assessment Thoracic Assessment: Within Functional Limits Lumbar Assessment Lumbar Assessment:  (posterior pelvic tilt) Postural Control Postural Control: Deficits on evaluation  Balance Balance Balance Assessed: Yes Static Sitting Balance Static Sitting - Balance Support: Feet supported Static Sitting - Level of Assistance: 6: Modified independent (Device/Increase time) Dynamic Sitting Balance Dynamic Sitting - Balance Support: Feet supported Dynamic Sitting - Level of Assistance: 5: Stand by assistance Static Standing Balance Static Standing - Balance Support: No upper extremity supported Static Standing - Level of Assistance: 5: Stand by assistance Dynamic Standing Balance Dynamic Standing - Balance Support: No upper extremity supported Dynamic Standing - Level of Assistance: 5: Stand by assistance Extremity Assessment  RLE Assessment General Strength Comments: grossly 4/5 LLE Assessment General Strength Comments: grossly  4/5    Breck Coons, PT, DPT 03/22/2021, 3:47 PM

## 2021-03-22 NOTE — Progress Notes (Signed)
Brief Note:  Consulted Cards as patient is planned to d/c tomorrow. Will await further recs. Appreciate input.

## 2021-03-22 NOTE — Progress Notes (Signed)
Occupational Therapy Session Note  Patient Details  Name: Arthur [name] MRN: [id number] Date of Birth: Sep 18, 1960  Today's Date: 03/22/2021 OT Individual Time: 1000-1056 OT Individual Time Calculation (min): 56 min   Today's Date: 03/22/2021 OT Individual Time: 1330-1400 OT Individual Time Calculation (min): 30 min   Short Term Goals: Week 1:  OT Short Term Goal 1 (Week 1): STGs=LTGs due to ELOS  Skilled Therapeutic Interventions/Progress Updates:    Session 1: Pt received in bed asleep and generally lethargic   In bed BP 131/96 Seated BP after bathing/ UB dressing. 177/112 pt reporting mild dizziness Seated rest break 3 min: 167/112 Attempted to redirect pt able to bed, however pt impulsivly stands and ambulates to bethroom for second urine void.  BP back in bed 188/114 LPN aware  ADL:  When gathering ADL items Pt gets out of bed without A and telesitter alarming. Pt standing at EOB when OT enters room with pt wanting to walk to bathroom. Pt completes ADL at overall supervision Level. Skilled interventions include: VC for posture throughout session, seated rest breaks to monitor BP, standing urine void, VC for seated BADL performance for safety and BP management. Pt completes bathing at sink with supervision sit to stand with cuing for seated washing of BLE. Pt perseverative on bathing UB 3x and requires redirection for OT to wash back to break cycle. Pt dons shirt with set up and pants with increased time and supervision. Pt demo significantly impaired continuation skill as pt frequently inappropriately stopping tasks and switching to another unfinished ADL task. Pt demo poor carry over of cues requiring direct VC once pressure is measured a second time to sit and rest . Retured to bed after second bladder void in bathroom despite encouragement to use urinal to decrease mobility d/t high BP.  Pt left at end of session in bed with exit alarm on, call light in reach and all needs  met  Session 2: Pt received in bed with no pain reported and no dizziness.   Pt completes mobility to/from bathroom as pt attempting to get OOB when OT enters room. Pt completes all mobility with S  to/from bathroom. Family education with wife after SLP finished on the phone orienting wife to handout which has written instructions as stated below as well as education on BP checking. Provided handouts on foods that are good for HTN and DM.  General mobility- When getting up, they should push up from the surface they are getting up from and reach back when sitting to a new surface, no plopping.  You are their "shadow." Especially in the beginning. You, as the helper should be in reach of the patient when mobilizing. You should be either beside or behind them so if they lose their balance you can assist by helping correct at the hips. This is likely closer than you are used to being- be in their personal space. Use a gait belt if that makes you feel more comfortable If you are attempting to get up/transfer and it is not going well, reset. Have them sit back down. Make sure they are close to the edge of the seat, feet are underneath them at hips distance, and they are leaning forward to stand up. If he is complaining of dizziness he should sit as BP likely high Check BP at least once a day  Take prolonged rest breaks if BP is high  Bathing- they should sit to bathe on a shower chair, especially for washing legs/feet.  Sitting will save energy and increase safety. May need to direct patient to move on from bathing same body parts over and over Dressing- all should be done from a SEATED level, especially to put underwear and pants over feet. Standing to put his feet in his pants would make him a high fall risk Toileting- someone should be around him at all times as pt can be impulsive to get up without help. Use a gait belt as needed to assist with any loss of balance. if incontinence/bathroom accidents are  an issue attempt to toilet every 2-3 hours to improve success with toileting and decrease accidents.  Energy conservation principles- Prioritize what needs to be done and what can be moved to another day Plan out their days, weeks, months to spread out taxing (physical or cognitively tiring) activities to not put too much at one time Pace activities- rest before feeling tired and have designated places to rest if they feel tired and need to take a brake Position for success: sit when able to conserve 25% more energy than standing  Exited session with pt seated in bed, exit alarm on and call light in reach   Therapy Documentation Precautions:     Therapy/Group:   Tonny Branch 03/22/2021, 6:46 AM

## 2021-03-22 NOTE — Progress Notes (Signed)
[place] PHYSICAL MEDICINE & REHABILITATION PROGRESS NOTE  Subjective/Complaints: Patient seen sitting up in bed this morning.  No reported issues overnight.  He remains confused, perseverative on [place].  Discussed confusion with therapies, fluid seems to be exacerbated by elevated blood pressures.  ROS: Limited due to cognition.  Objective: Vital Signs: Blood pressure (!) 131/96, pulse 71, temperature 98.2 F (36.8 C), temperature source Oral, resp. rate 16, height 5\' 3"  (1.6 m), weight 67.8 kg, SpO2 100 %. No results found. Recent Labs    03/20/21 0540  WBC 6.3  HGB 13.2  HCT 40.9  PLT 380     Recent Labs    03/20/21 0540  NA 137  K 4.0  CL 103  CO2 26  GLUCOSE 100*  BUN 11  CREATININE 1.04  CALCIUM 9.2     Intake/Output Summary (Last 24 hours) at 03/22/2021 1052 Last data filed at 03/22/2021 0730 Gross per 24 hour  Intake 660 ml  Output 200 ml  Net 460 ml          Physical Exam: BP (!) 131/96   Pulse 71   Temp 98.2 F (36.8 C) (Oral)   Resp 16   Ht 5\' 3"  (1.6 m)   Wt 67.8 kg   SpO2 100%   BMI 26.48 kg/m  Constitutional: No distress . Vital signs reviewed. HENT: Normocephalic.  Atraumatic. Eyes: EOMI. No discharge. Cardiovascular: No JVD.  RRR. Respiratory: Normal effort.  No stridor.  Bilateral clear to auscultation. GI: Non-distended.  BS +. Skin: Warm and dry.  Intact. Psych: Hyperactive.  Confused. Musc: No edema in extremities.  No tenderness in extremities. Neuro: Alert and oriented x1, unchanged Motor: 4/5 throughout, appears stable  Assessment/Plan: 1. Functional deficits which require 3+ hours per day of interdisciplinary therapy in a comprehensive inpatient rehab setting. Physiatrist is providing close team supervision and 24 hour management of active medical problems listed below. Physiatrist and rehab team continue to assess barriers to discharge/monitor patient progress toward functional and medical goals   Care  Tool:  Bathing    Body parts bathed by patient: Right arm, Left arm, Chest, Abdomen, Front perineal area, Buttocks, Right upper leg, Left upper leg, Right lower leg, Left lower leg, Face         Bathing assist Assist Level: Supervision/Verbal cueing     Upper Body Dressing/Undressing Upper body dressing   What is the patient wearing?: Pull over shirt    Upper body assist Assist Level: Set up assist    Lower Body Dressing/Undressing Lower body dressing      What is the patient wearing?: Pants     Lower body assist Assist for lower body dressing: Supervision/Verbal cueing     Toileting Toileting Toileting Activity did not occur (Clothing management and hygiene only): N/A (no void or bm)  Toileting assist Assist for toileting: Contact Guard/Touching assist     Transfers Chair/bed transfer  Transfers assist     Chair/bed transfer assist level: Supervision/Verbal cueing     Locomotion Ambulation   Ambulation assist      Assist level: Contact Guard/Touching assist Assistive device: No Device Max distance: >200   Walk 10 feet activity   Assist     Assist level: Contact Guard/Touching assist Assistive device: No Device   Walk 50 feet activity   Assist    Assist level: Contact Guard/Touching assist Assistive device: No Device    Walk 150 feet activity   Assist Walk 150 feet activity did not occur:  Safety/medical concerns (fatigue, weakness, decreased balance)  Assist level: Contact Guard/Touching assist Assistive device: No Device    Walk 10 feet on uneven surface  activity   Assist     Assist level: Minimal Assistance - Patient > 75% Assistive device: Other (comment) (handrail)   Wheelchair     Assist Is the patient using a wheelchair?: Yes Type of Wheelchair: Manual    Wheelchair assist level: Supervision/Verbal cueing Max wheelchair distance: 67ft    Wheelchair 50 feet with 2 turns activity    Assist         Assist Level: Maximal Assistance - Patient 25 - 49% (per PT note)   Wheelchair 150 feet activity     Assist      Assist Level: Total Assistance - Patient < 25% (per PT note)  BP (!) 131/96   Pulse 71   Temp 98.2 F (36.8 C) (Oral)   Resp 16   Ht 5\' 3"  (1.6 m)   Wt 67.8 kg   SpO2 100%   BMI 26.48 kg/m    Medical Problem List and Plan: 1.  Deficits in mobility, self-care, cognition secondary to seizures (+ benzos at admit, ? withdrawal, ETOH not checked) (left parietal region) and resulting encephalopathy.  Continue to monitor 2.  Impaired mobility   Mechanical: Sequential compression devices, below knee Bilateral lower extremities Pharmaceutical: continue Lovenox              -antiplatelet therapy: ASA 81 3. Pain Management: As needed meds             Team support 4.  Mood: Klonopin 1 mg twice daily for anxiety             -antipsychotic agents:N/A 5. Neuropsych: This patient is not capable of making decisions on his own behalf.  for safety 6. Skin/Wound Care: Routine skin care 7. Fluids/Electrolytes/Nutrition:              encourage PO -low albumin--protein supp 8.  Essential hypertension             Norvasc 10, clonidine 0.1 mg TID, Lisinopril increased to 20mg  BID  Hydralazine scheduled started on 10/25, increased on 10/26-elevated blood pressures with correlation to cognition) Vitals:   03/22/21 0937 03/22/21 1014  BP: (!) 153/109 (!) 131/96  Pulse:    Resp:    Temp:    SpO2:     9.  Seizures Perampanel 4 mg x 5 days, then 2 mg x 10 days Phenytoin taper completed Vimpat 200 mg twice daily             Keppra 1500 mg twice daily No breakthrough seizures from admission to rehab - 10/26 10.  Diabetes mellitus type 2 with hyperglycemia              Metformin 500 mg twice daily PTA             CBG (last 3)  Recent Labs    03/21/21 1651 03/21/21 2122 03/22/21 0614  GLUCAP 105* 95 97     Controlled on 10/26 11.  Hyponatremia              resolved   -reduce FR to 1200 cc BMP Latest Ref Rng & Units 03/20/2021 03/16/2021 03/15/2021  Glucose 70 - 99 mg/dL 03/18/2021) 84 03/17/2021)  BUN 6 - 20 mg/dL 11 14 13   Creatinine 0.61 - 1.24 mg/dL 700(F 749(S  BUN/Creat Ratio 9 - 20 - - -  Sodium  135 - 145 mmol/L 137 136 128(L)  Potassium 3.5 - 5.1 mmol/L 4.0 4.4 4.1  Chloride 98 - 111 mmol/L 103 104 98  CO2 22 - 32 mmol/L 26 25 23   Calcium 8.9 - 10.3 mg/dL 9.2 9.0 )    12. Constipation:   Improving  LOS: 11 days A FACE TO FACE EVALUATION WAS PERFORMED  Rynell Ciotti 3.3(P 03/22/2021, 10:52 AM

## 2021-03-23 ENCOUNTER — Other Ambulatory Visit (HOSPITAL_COMMUNITY): Payer: Medicaid Other | Attending: Cardiology

## 2021-03-23 DIAGNOSIS — I420 Dilated cardiomyopathy: Secondary | ICD-10-CM | POA: Insufficient documentation

## 2021-03-23 LAB — BASIC METABOLIC PANEL
Anion gap: 6 (ref 5–15)
BUN: 19 mg/dL (ref 6–20)
CO2: 25 mmol/L (ref 22–32)
Calcium: 8.9 mg/dL (ref 8.9–10.3)
Chloride: 106 mmol/L (ref 98–111)
Creatinine, Ser: 1.49 mg/dL — ABNORMAL HIGH (ref 0.61–1.24)
GFR, Estimated: 54 mL/min — ABNORMAL LOW (ref >60)
Glucose, Bld: 99 mg/dL (ref 70–99)
Potassium: 4 mmol/L (ref 3.5–5.1)
Sodium: 137 mmol/L (ref 135–145)

## 2021-03-23 LAB — LIPID PANEL
Cholesterol: 147 mg/dL (ref 0–200)
HDL: 36 mg/dL — ABNORMAL LOW (ref >40)
LDL Cholesterol: 86 mg/dL (ref 0–99)
Total CHOL/HDL Ratio: 4.1 RATIO
Triglycerides: 127 mg/dL (ref <150)
VLDL: 25 mg/dL (ref 0–40)

## 2021-03-23 LAB — ECHOCARDIOGRAM LIMITED
AR max vel: 1.77 cm2
AV Peak grad: 6.2 mmHg
Ao pk vel: 1.25 m/s
Area-P 1/2: 5.06 cm2
Calc EF: 43.7 %
Height: 63 in
S' Lateral: 3.5 cm
Single Plane A2C EF: 41.7 %
Single Plane A4C EF: 43.1 %
Weight: 2391.55 oz

## 2021-03-23 LAB — GLUCOSE, CAPILLARY
Glucose-Capillary: 99 mg/dL (ref 70–99)
Glucose-Capillary: 87 mg/dL (ref 70–99)
Glucose-Capillary: 128 mg/dL — ABNORMAL HIGH (ref 70–99)
Glucose-Capillary: 103 mg/dL — ABNORMAL HIGH (ref 70–99)

## 2021-03-23 MED ORDER — HYDRALAZINE HCL 50 MG PO TABS
50.0000 mg | ORAL_TABLET | Freq: Three times a day (TID) | ORAL | Status: DC
  Administered 2021-03-23 – 2021-03-24 (×3): 50 mg via ORAL
  Filled 2021-03-23 (×3): qty 1

## 2021-03-23 MED ORDER — SORBITOL 70 % SOLN
30.0000 mL | Freq: Once | Status: AC
  Administered 2021-03-23: 30 mL via ORAL
  Filled 2021-03-23: qty 30

## 2021-03-23 NOTE — Progress Notes (Signed)
Subjective:  Patient seen and examined at bedside at approximately 8:00 AM Patient sitting up resting comfortably in bed eating breakfast Patient states he is feeling well overall and is ready to go home. Denies issues with initiation of new antihypertensive medications yesterday. Denies chest pain, dyspnea, palpitations, dizziness, syncope, near syncope.  Blood pressure well controlled this morning.   Cardiology was consulted prior to discharge for management of hypertension.  Yesterday switched patient to transdermal clonidine, Added chlorthalidone and isosorbide dinitrate.  This morning BMP noted that creatinine has increased from 1.0 yesterday to 1.4 this morning.  Intake/Output from previous day:  I/O last 3 completed shifts: In: 16 [P.O.:570] Out: -  Total I/O In: 240 [P.O.:240] Out: -   Blood pressure 132/89, pulse 85, temperature 97.8 F (36.6 C), resp. rate 16, height $RemoveBe'5\' 3"'[id number]$  (1.6 m), weight 67.8 kg, SpO2 100 %. Physical Exam Vitals reviewed.  HENT:     Head: Normocephalic and atraumatic.  Cardiovascular:     Rate and Rhythm: Normal rate and regular rhythm.     Pulses: Intact distal pulses.     Heart sounds: S1 normal and S2 normal. No murmur heard.   No gallop.  Pulmonary:     Effort: Pulmonary effort is normal. No respiratory distress.     Breath sounds: No wheezing, rhonchi or rales.  Musculoskeletal:     Right lower leg: No edema.     Left lower leg: No edema.  Neurological:     Mental Status: He is alert.    Lab Results: BMP BNP (last 3 results) Recent Labs    03/02/21 0312  BNP 77.6    ProBNP (last 3 results) No results for input(s): PROBNP in the last 8760 hours. BMP Latest Ref Rng & Units 03/23/2021 03/20/2021 03/16/2021  Glucose 70 - 99 mg/dL 99 100(H) 84  BUN 6 - 20 mg/dL $Remove'19 11 14  'olqpCGJ$ Creatinine 0.61 - 1.24 mg/dL 1.49(H) 1.04 1.19  BUN/Creat Ratio 9 - 20 - - -  Sodium 135 - 145 mmol/L 137 137 136  Potassium 3.5 - 5.1 mmol/L 4.0 4.0 4.4   Chloride 98 - 111 mmol/L 106 103 104  CO2 22 - 32 mmol/L $RemoveB'25 26 25  'EYbECjAP$ Calcium 8.9 - 10.3 mg/dL 8.9 9.2 9.0   Hepatic Function Latest Ref Rng & Units 03/15/2021 02/28/2021 02/27/2021  Total Protein 6.5 - 8.1 g/dL 7.0 6.4(L) 6.8  Albumin 3.5 - 5.0 g/dL 3.1(L) 3.0(L) 3.2(L)  AST 15 - 41 U/L $Remo'31 15 16  'rAWof$ ALT 0 - 44 U/L 44 11 11  Alk Phosphatase 38 - 126 U/L 116 66 60  Total Bilirubin 0.3 - 1.2 mg/dL 0.2(L) 0.4 0.7   CBC Latest Ref Rng & Units 03/20/2021 03/13/2021 02/27/2021  WBC 4.0 - 10.5 K/uL 6.3 5.4 7.9  Hemoglobin 13.0 - 17.0 g/dL 13.2 13.0 14.4  Hematocrit 39.0 - 52.0 % 40.9 39.1 42.7  Platelets 150 - 400 K/uL 380 349 233   Lipid Panel     Component Value Date/Time   CHOL 147 03/23/2021 0521   TRIG 127 03/23/2021 0521   HDL 36 (L) 03/23/2021 0521   CHOLHDL 4.1 03/23/2021 0521   VLDL 25 03/23/2021 0521   LDLCALC 86 03/23/2021 0521   LDLDIRECT 124.3 (H) 10/17/2020 0151   Cardiac Panel (last 3 results) No results for input(s): CKTOTAL, CKMB, TROPONINI, RELINDX in the last 72 hours.  HEMOGLOBIN A1C Lab Results  Component Value Date   HGBA1C 6.0 (H) 02/27/2021   MPG 125.5 02/27/2021  TSH Recent Labs    02/27/21 0244  TSH 1.377   Imaging: Chest x-ray single view 02/24/2021: Decreased lung volumes are seen with mild areas of atelectasis noted within the bilateral lung bases. There is no evidence of a pleural effusion or pneumothorax. The cardiac silhouette is moderately enlarged. This may be technical in origin. The visualized skeletal structures are unremarkable. IMPRESSION: Low lung volumes with mild bibasilar atelectasis.  Cardiac Studies: Echocardiogram 10/17/2020: 1. Left ventricular ejection fraction, by estimation, is 45 to 50%. The left ventricle has mildly decreased function. The left ventricle demonstrates global hypokinesis. There is moderate left ventricular hypertrophy. Left ventricular diastolic  parameters are consistent with Grade I diastolic dysfunction  (impaired relaxation).  2. Right ventricular systolic function is mildly reduced. The right ventricular size is normal. Tricuspid regurgitation signal is inadequate for assessing PA pressure.  3. The mitral valve is normal in structure. No evidence of mitral valve regurgitation. No evidence of mitral stenosis.  4. The aortic valve is tricuspid. Aortic valve regurgitation is not visualized. Mild aortic valve sclerosis is present, with no evidence of aortic valve stenosis.  5. The inferior vena cava is normal in size with greater than 50% respiratory variability, suggesting right atrial pressure of 3 mmHg.  6. Technically difficult study with poor acoustic windows.  Compared to 10/03/2019, EF was reduced from normal 55 to 60%.  EKG:  02/24/2021: Normal sinus rhythm at rate of 97 bpm, biatrial enlargement, baseline artifact, nonspecific T abnormality..  Scheduled Meds:  amLODipine  10 mg Oral Daily   aspirin EC  81 mg Oral Daily   clonazePAM  1 mg Oral BID   cloNIDine  0.2 mg Transdermal Weekly   enoxaparin (LOVENOX) injection  40 mg Subcutaneous Q24H   hydrALAZINE  50 mg Oral Q8H   isosorbide dinitrate  30 mg Oral TID   lacosamide  200 mg Oral BID   levETIRAcetam  1,500 mg Oral BID   lisinopril  20 mg Oral BID   perampanel  2 mg Oral QHS   phenytoin  50 mg Oral Daily   senna  2 tablet Oral Daily   Continuous Infusions: PRN Meds:.acetaminophen **OR** acetaminophen, hydrALAZINE, HYDROcodone-acetaminophen, methocarbamol, midazolam, oxyCODONE  Assessment/Plan:  Given patient's increased creatinine will discontinue chlorthalidone. Will repeat BMP in the morning.  Will increase hydralazine from 25 mg to 50 mg three times daily.  Continue to monitor blood pressure closely, it was well controlled this morning.   Lipids are well controlled on repeat fasting lipid panel.   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  Repeat echocardiogram is pending, scheduled for later today.   If blood pressure remains well controlled with medication changes and renal functions improves patient would likely be ready for discharge tomorrow from a cardiology standpoint, unless significant abnormalities noted on echocardiogram.    Patient was seen in collaboration with Dr. Einar Gip. He also reviewed patient's chart and examined the patient. Dr. Einar Gip is in agreement of the plan.    Alethia Berthold, PA-C 03/23/2021, 9:55 AM Office: [phone]

## 2021-03-23 NOTE — Progress Notes (Signed)
[place] PHYSICAL MEDICINE & REHABILITATION PROGRESS NOTE  Subjective/Complaints: Patient seen sitting up in bed this morning.  No reported issues overnight.  Patient was planned to be discharged today, however due to recent medication changes will hold today.  He was seen by cardiology this morning, notes reviewed-medications changed again.  ROS: Limited due to cognition.  Objective: Vital Signs: Blood pressure 132/89, pulse 85, temperature 97.8 F (36.6 C), resp. rate 16, height 5\' 3"  (1.6 m), weight 67.8 kg, SpO2 100 %. No results found. No results for input(s): WBC, HGB, HCT, PLT in the last 72 hours.   Recent Labs    03/23/21 0521  NA 137  K 4.0  CL 106  CO2 25  GLUCOSE 99  BUN 19  CREATININE 1.49*  CALCIUM 8.9     Intake/Output Summary (Last 24 hours) at 03/23/2021 1046 Last data filed at 03/23/2021 0845 Gross per 24 hour  Intake 570 ml  Output --  Net 570 ml          Physical Exam: BP 132/89 (BP Location: Right Arm)   Pulse 85   Temp 97.8 F (36.6 C)   Resp 16   Ht 5\' 3"  (1.6 m)   Wt 67.8 kg   SpO2 100%   BMI 26.48 kg/m  Constitutional: No distress . Vital signs reviewed. HENT: Normocephalic.  Atraumatic. Eyes: EOMI. No discharge. Cardiovascular: No JVD.  RRR. Respiratory: Normal effort.  No stridor.  Bilateral clear to auscultation. GI: Non-distended.  BS +. Skin: Warm and dry.  Intact. Psych: Flat.  Normal behavior. Musc: No edema in extremities.  No tenderness in extremities. Neuro: Alert and oriented x1, stable Motor: 4/5 throughout, appears stable  Assessment/Plan: 1. Functional deficits which require 3+ hours per day of interdisciplinary therapy in a comprehensive inpatient rehab setting. Physiatrist is providing close team supervision and 24 hour management of active medical problems listed below. Physiatrist and rehab team continue to assess barriers to discharge/monitor patient progress toward functional and medical goals   Care  Tool:  Bathing    Body parts bathed by patient: Right arm, Left arm, Chest, Abdomen, Front perineal area, Buttocks, Right upper leg, Left upper leg, Right lower leg, Left lower leg, Face         Bathing assist Assist Level: Supervision/Verbal cueing     Upper Body Dressing/Undressing Upper body dressing   What is the patient wearing?: Pull over shirt    Upper body assist Assist Level: Set up assist    Lower Body Dressing/Undressing Lower body dressing      What is the patient wearing?: Pants     Lower body assist Assist for lower body dressing: Supervision/Verbal cueing     Toileting Toileting Toileting Activity did not occur (Clothing management and hygiene only): N/A (no void or bm)  Toileting assist Assist for toileting: Supervision/Verbal cueing     Transfers Chair/bed transfer  Transfers assist     Chair/bed transfer assist level: Supervision/Verbal cueing     Locomotion Ambulation   Ambulation assist      Assist level: Supervision/Verbal cueing Assistive device: No Device Max distance: 300'   Walk 10 feet activity   Assist     Assist level: Supervision/Verbal cueing Assistive device: No Device   Walk 50 feet activity   Assist    Assist level: Supervision/Verbal cueing Assistive device: No Device    Walk 150 feet activity   Assist Walk 150 feet activity did not occur: Safety/medical concerns (fatigue, weakness, decreased balance)  Assist level: Supervision/Verbal cueing Assistive device: No Device    Walk 10 feet on uneven surface  activity   Assist     Assist level: Supervision/Verbal cueing Assistive device: Other (comment) (handrail)   Wheelchair     Assist Is the patient using a wheelchair?: No Type of Wheelchair: Manual    Wheelchair assist level: Supervision/Verbal cueing Max wheelchair distance: 43ft    Wheelchair 50 feet with 2 turns activity    Assist        Assist Level: Maximal  Assistance - Patient 25 - 49% (per PT note)   Wheelchair 150 feet activity     Assist      Assist Level: Total Assistance - Patient < 25% (per PT note)  BP 132/89 (BP Location: Right Arm)   Pulse 85   Temp 97.8 F (36.6 C)   Resp 16   Ht 5\' 3"  (1.6 m)   Wt 67.8 kg   SpO2 100%   BMI 26.48 kg/m    Medical Problem List and Plan: 1.  Deficits in mobility, self-care, cognition secondary to seizures (+ benzos at admit, ? withdrawal, ETOH not checked) (left parietal region) and resulting encephalopathy.  Patient was planned to be DC today, however on hold due to recent medication changes and further work-up.  We will plan for discharge tomorrow. 2.  Impaired mobility   Mechanical: Sequential compression devices, below knee Bilateral lower extremities Pharmaceutical: continue Lovenox              -antiplatelet therapy: ASA 81 3. Pain Management: As needed meds             Team support 4.  Mood: Klonopin 1 mg twice daily for anxiety             -antipsychotic agents:N/A 5. Neuropsych: This patient is not capable of making decisions on his own behalf.  for safety 6. Skin/Wound Care: Routine skin care 7. Fluids/Electrolytes/Nutrition:              encourage PO -low albumin--protein supp 8.  Essential hypertension             Norvasc 10, Lisinopril increased to 20mg  BID  Hydralazine scheduled started on 10/25, increased on 10/26-elevated blood pressures with correlation to cognition) Vitals:   03/23/21 0523 03/23/21 0824  BP: 124/73 132/89  Pulse: 66 85  Resp: 18 16  Temp: 97.8 F (36.6 C)   SpO2: 100%    Medications being adjusted by cardiology-appreciate recommendations  Plan for echo today 9.  Seizures Perampanel 4 mg x 5 days, then 2 mg x 10 days Phenytoin taper completed Vimpat 200 mg twice daily             Keppra 1500 mg twice daily No breakthrough seizures from admission to rehab - 10/27 10.  Diabetes mellitus type 2 with hyperglycemia               Metformin 500 mg twice daily PTA             CBG (last 3)  Recent Labs    03/22/21 1659 03/22/21 2110 03/23/21 0538  GLUCAP 92 105* 99     Controlled on 10/27 11.  Hyponatremia             resolved   -reduce FR to 1200 cc BMP Latest Ref Rng & Units 03/23/2021 03/20/2021 03/16/2021  Glucose 70 - 99 mg/dL 99 03/22/2021) 84  BUN 6 - 20 mg/dL 19 11 14  Creatinine 0.61 - 1.24 mg/dL 7.00(F) 7.49 4.49  BUN/Creat Ratio 9 - 20 - - -  Sodium 135 - 145 mmol/L 137 137 136  Potassium 3.5 - 5.1 mmol/L 4.0 4.0 4.4  Chloride 98 - 111 mmol/L 106 103 104  CO2 22 - 32 mmol/L 25 26 25   Calcium 8.9 - 10.3 mg/dL 8.9 9.2 9.0    12. Constipation:   Improving 13.  AKI  Creatinine 1.49 on 10/27, labs ordered for tomorrow  Likely secondary to medications-meds ajusted again per Cards  LOS: 12 days A FACE TO FACE EVALUATION WAS PERFORMED  Gracieann Stannard 11/27 03/23/2021, 10:46 AM

## 2021-03-23 NOTE — Progress Notes (Signed)
Speech Language Pathology Discharge Summary  Patient Details  Name: Arthur [name] MRN: [id number] Date of Birth: 10-Jun-1960  Patient has met 4 of 5 long term goals.  Patient to discharge at [facility] level.   Reasons goals not met: Patient continues to require Mod multimodal cues for basic auditory comprehension at times   Clinical Impression/Discharge Summary: Patient has made slow and inconsistent gains but has met 4 of 5 LTGs this admission. Currently, patient requires overall Min-Mod A verbal cues for auditory comprehension of basic information and verbal expression of wants/needs. Patient demonstrates decreased word-finding with phonemic paraphrasias with intermittent awareness and ability to self-correct. Patient also requires overall Min-Mod A multimodal cues to perform functional and familiar tasks safely in regards to problem solving and awareness. Of note, patient's overall functional communication and cognitive functioning can fluctuate depending on patient's blood pressure.  Family education was completed via phone with the patient's wife regarding ways to maximize cognitive-linguistic functioning and overall safety at home. Handouts were also provided to the patient. Patient will discharge home with 24 hour supervision from family and would benefit from f/u SLP services to maximize his cognitive-linguistic functioning in order to reduce caregiver burden.   Care Partner:  Caregiver Able to Provide Assistance: Yes  Type of Caregiver Assistance: Physical;Cognitive  Recommendation:  Home Health SLP;24 hour supervision/assistance  Rationale for SLP Follow Up: Reduce caregiver burden;Maximize cognitive function and independence;Maximize functional communication   Equipment: N/A   Reasons for discharge: Discharged from hospital;Treatment goals met   Patient/Family Agrees with Progress Made and Goals Achieved: Yes    [name], [name] 03/23/2021, 6:52 AM

## 2021-03-23 NOTE — Progress Notes (Signed)
Patient ID: Arthur [name], male   DOB: Oct 15, 1960, 60 y.o.   MRN: [id number]  SW received updates from medical team pt will not d/c today due to medication changes. Possible d/c tomorrow.  SW called pt wife Arthur [name] to inform on changes, and informed attending will follow-up to discuss further.   Cecile Sheerer, MSW, LCSWA Office: [phone] Cell: [phone] Fax: [phone]

## 2021-03-24 ENCOUNTER — Other Ambulatory Visit (HOSPITAL_COMMUNITY): Payer: Self-pay

## 2021-03-24 LAB — BASIC METABOLIC PANEL
Anion gap: 7 (ref 5–15)
BUN: 20 mg/dL (ref 6–20)
CO2: 24 mmol/L (ref 22–32)
Calcium: 8.8 mg/dL — ABNORMAL LOW (ref 8.9–10.3)
Chloride: 101 mmol/L (ref 98–111)
Creatinine, Ser: 1.24 mg/dL (ref 0.61–1.24)
GFR, Estimated: >60 mL/min (ref >60)
Glucose, Bld: 106 mg/dL — ABNORMAL HIGH (ref 70–99)
Potassium: 3.7 mmol/L (ref 3.5–5.1)
Sodium: 132 mmol/L — ABNORMAL LOW (ref 135–145)

## 2021-03-24 LAB — GLUCOSE, CAPILLARY: Glucose-Capillary: 99 mg/dL (ref 70–99)

## 2021-03-24 MED ORDER — HYDRALAZINE HCL 50 MG PO TABS
50.0000 mg | ORAL_TABLET | Freq: Three times a day (TID) | ORAL | 0 refills | Status: DC
  Filled 2021-03-24: qty 90, 30d supply, fill #0

## 2021-03-24 MED ORDER — ISOSORBIDE DINITRATE 30 MG PO TABS
30.0000 mg | ORAL_TABLET | Freq: Three times a day (TID) | ORAL | 0 refills | Status: DC
  Filled 2021-03-24: qty 90, 30d supply, fill #0

## 2021-03-24 MED ORDER — CLONIDINE 0.2 MG/24HR TD PTWK
0.2000 mg | MEDICATED_PATCH | TRANSDERMAL | 12 refills | Status: DC
  Filled 2021-03-24: qty 4, 28d supply, fill #0

## 2021-03-24 NOTE — Progress Notes (Signed)
INPATIENT REHABILITATION DISCHARGE NOTE   Discharge instructions by: [name] PA  Verbalized understanding: done   Skin care/Wound care healing? No wounds present  Pain: no c/o pain   IV's: removed   Tubes/Drains: Removed   O2: room air   Safety instructions: given by [name] PA  Patient belongings: given to wife [name]   Discharged to: home   Discharged via:Wheelchair   Notes:

## 2021-03-24 NOTE — Progress Notes (Signed)
Inpatient Rehabilitation Care Coordinator Discharge Note   Patient Details  Name: MARQUAVION VENHUIZEN MRN: [id number] Date of Birth: 05/28/1961   Discharge location: D/c to home with fiance  Length of Stay: 12 days  Discharge activity level: Supervision  Home/community participation: Limited  Patient response ID:[id number] Literacy - How often do you need to have someone help you when you read instructions, pamphlets, or other written material from your doctor or pharmacy?: Rarely  Patient response NT:[id number] Isolation - How often do you feel lonely or isolated from those around you?: Patient unable to respond  Services provided included: MD, RD, OT, PT, SLP, RN, CM, TR, Pharmacy, SW, Neuropsych  Financial Services:  Financial Services Utilized: Medicaid    Choices offered to/list presented to: Yes  Follow-up services arranged:  Home Health Home Health Agency: Home Health pending finding an agency. Not able to secure an agency prior to discharge due to insurance.      Patient response to transportation need: Is the patient able to respond to transportation needs?: Yes In the past 12 months, has lack of transportation kept you from medical appointments or from getting medications?: No In the past 12 months, has lack of transportation kept you from meetings, work, or from getting things needed for daily living?: No   Comments (or additional information):  Patient/Family verbalized understanding of follow-up arrangements:  Yes  Individual responsible for coordination of the follow-up plan: contact pt wife [name]#[phone]  Confirmed correct DME delivered: Gretchen Short 03/24/2021    Gretchen Short

## 2021-03-24 NOTE — Progress Notes (Signed)
[place] PHYSICAL MEDICINE & REHABILITATION PROGRESS NOTE  Subjective/Complaints: Arthur [name] is stable for d/c today His wife is getting him dressed now [name] has called pharmacy to help get his meds here as quickly as possible.   ROS: Limited due to cognition.  Objective: Vital Signs: Blood pressure (!) 141/98, pulse 79, temperature 97.6 F (36.4 C), temperature source Oral, resp. rate 18, height 5\' 3"  (1.6 m), weight 67.8 kg, SpO2 100 %. ECHOCARDIOGRAM LIMITED  Result Date: 03/23/2021    ECHOCARDIOGRAM LIMITED REPORT   Patient Name:   Arthur [name] Date of Exam: 03/23/2021 Medical Rec #:  03/25/2021    Height:       63.0 in Accession #:    [id number]   Weight:       149.5 lb Date of Birth:  1960/12/22     BSA:          1.709 m Patient Age:    60 years     BP:           132/89 mmHg Patient Gender: M            HR:           85 bpm. Exam Location:  Inpatient Procedure: 2D Echo, Color Doppler and Cardiac Doppler Indications:    Dilated cmp  History:        Patient has prior history of Echocardiogram examinations, most                 recent 10/19/2020.  Sonographer:    10/21/2020 Referring Phys: [id number] IMPRESSIONS  1. There is global hypokinesis and inferior and inferoseptal hypokinesis. . Left ventricular ejection fraction, by estimation, is 40 to 45%. Left ventricular ejection fraction by 2D MOD biplane is 43.7 %. The left ventricle has mildly decreased function. The left ventricle demonstrates regional wall motion abnormalities (see scoring diagram/findings for description). There is moderate concentric left ventricular hypertrophy. Left ventricular diastolic parameters are consistent with Grade II diastolic dysfunction (pseudonormalization). Elevated left ventricular end-diastolic pressure. There is mild hypokinesis of the left ventricular, entire inferoseptal wall and inferior segment.  2. Right ventricular systolic function is normal. The right ventricular size is normal.  3. The mitral  valve is normal in structure. No evidence of mitral valve regurgitation. No evidence of mitral stenosis.  4. The aortic valve is normal in structure. Aortic valve regurgitation is not visualized. No aortic stenosis is present.  5. The inferior vena cava is normal in size with greater than 50% respiratory variability, suggesting right atrial pressure of 3 mmHg. Comparison(s): No significant change from prior study. Prior study from 10/17/2020 personally compared. I personally feel there is no significant difference in LVEF or global hypokinesis and mild inferior and inferoseptal l hypokinesis. FINDINGS  Left Ventricle: There is global hypokinesis and inferior and inferoseptal hypokinesis. Left ventricular ejection fraction, by estimation, is 40 to 45%. Left ventricular ejection fraction by 2D MOD biplane is 43.7 %. The left ventricle has mildly decreased function. The left ventricle demonstrates regional wall motion abnormalities. Mild hypokinesis of the left ventricular, entire inferoseptal wall and inferior segment. There is moderate concentric left ventricular hypertrophy. Left ventricular diastolic parameters are consistent with Grade II diastolic dysfunction (pseudonormalization). Elevated left ventricular end-diastolic pressure. Right Ventricle: The right ventricular size is normal. No increase in right ventricular wall thickness. Right ventricular systolic function is normal. Pericardium: Trivial pericardial effusion is present. Mitral Valve: The mitral valve is normal in structure. No evidence of  mitral valve stenosis. Tricuspid Valve: The tricuspid valve is normal in structure. Tricuspid valve regurgitation is not demonstrated. No evidence of tricuspid stenosis. Aortic Valve: The aortic valve is normal in structure. Aortic valve regurgitation is not visualized. No aortic stenosis is present. Aortic valve peak gradient measures 6.2 mmHg. Pulmonic Valve: The pulmonic valve was normal in structure. Pulmonic  valve regurgitation is trivial. No evidence of pulmonic stenosis. Aorta: The aortic root is normal in size and structure. Venous: The inferior vena cava is normal in size with greater than 50% respiratory variability, suggesting right atrial pressure of 3 mmHg. LEFT VENTRICLE PLAX 2D                        Biplane EF (MOD) LVIDd:         4.20 cm         LV Biplane EF:   Left LVIDs:         3.50 cm                          ventricular LV PW:         1.60 cm                          ejection LV IVS:        1.50 cm                          fraction by LVOT diam:     2.00 cm                          2D MOD LV SV:         30                               biplane is LV SV Index:   18                               43.7 %. LVOT Area:     3.14 cm                                Diastology                                LV e' medial:    2.61 cm/s LV Volumes (MOD)               LV E/e' medial:  19.5 LV vol d, MOD    63.6 ml       LV e' lateral:   4.90 cm/s A2C:                           LV E/e' lateral: 10.4 LV vol d, MOD    73.1 ml A4C: LV vol s, MOD    37.1 ml A2C: LV vol s, MOD    41.6 ml A4C: LV SV MOD A2C:   26.5 ml LV SV MOD A4C:   73.1 ml LV SV MOD BP:    30.6 ml RIGHT VENTRICLE  IVC RV S prime:     7.51 cm/s  IVC diam: 0.80 cm TAPSE (M-mode): 1.1 cm LEFT ATRIUM         Index LA diam:    3.30 cm 1.93 cm/m  AORTIC VALVE AV Area (Vmax): 1.77 cm AV Vmax:        124.50 cm/s AV Peak Grad:   6.2 mmHg LVOT Vmax:      70.15 cm/s LVOT Vmean:     48.850 cm/s LVOT VTI:       0.097 m  AORTA Ao Root diam: 3.50 cm Ao Asc diam:  3.00 cm MITRAL VALVE MV Area (PHT): 5.06 cm    SHUNTS MV Decel Time: 150 msec    Systemic VTI:  0.10 m MV E velocity: 50.90 cm/s  Systemic Diam: 2.00 cm MV A velocity: 64.00 cm/s MV E/A ratio:  0.80 Yates Decamp MD Electronically signed by Yates Decamp MD Signature Date/Time: 03/23/2021/11:47:59 AM    Final    No results for input(s): WBC, HGB, HCT, PLT in the last 72 hours.   Recent Labs     03/23/21 0521 03/24/21 0505  NA 137 132*  K 4.0 3.7  CL 106 101  CO2 25 24  GLUCOSE 99 106*  BUN 19 20  CREATININE 1.49* 1.24  CALCIUM 8.9 8.8*    Intake/Output Summary (Last 24 hours) at 03/24/2021 0909 Last data filed at 03/24/2021 0758 Gross per 24 hour  Intake 660 ml  Output 200 ml  Net 460 ml         Physical Exam: BP (!) 141/98 (BP Location: Right Arm)   Pulse 79   Temp 97.6 F (36.4 C) (Oral)   Resp 18   Ht 5\' 3"  (1.6 m)   Wt 67.8 kg   SpO2 100%   BMI 26.48 kg/m  Gen: no distress, normal appearing HEENT: oral mucosa pink and moist, NCAT Cardio: Reg rate Chest: normal effort, normal rate of breathing Abd: soft, non-distended Ext: no edema  Psych: Flat.  Normal behavior. Musc: No edema in extremities.  No tenderness in extremities. Neuro: Alert and oriented x1, stable Motor: 4/5 throughout, appears stable  Assessment/Plan: 1. Functional deficits which require 3+ hours per day of interdisciplinary therapy in a comprehensive inpatient rehab setting. Physiatrist is providing close team supervision and 24 hour management of active medical problems listed below. Physiatrist and rehab team continue to assess barriers to discharge/monitor patient progress toward functional and medical goals   Care Tool:  Bathing    Body parts bathed by patient: Right arm, Left arm, Chest, Abdomen, Front perineal area, Buttocks, Right upper leg, Left upper leg, Right lower leg, Left lower leg, Face         Bathing assist Assist Level: Supervision/Verbal cueing     Upper Body Dressing/Undressing Upper body dressing   What is the patient wearing?: Pull over shirt    Upper body assist Assist Level: Set up assist    Lower Body Dressing/Undressing Lower body dressing      What is the patient wearing?: Pants     Lower body assist Assist for lower body dressing: Supervision/Verbal cueing     Toileting Toileting Toileting Activity did not occur (Clothing  management and hygiene only): N/A (no void or bm)  Toileting assist Assist for toileting: Supervision/Verbal cueing     Transfers Chair/bed transfer  Transfers assist     Chair/bed transfer assist level: Supervision/Verbal cueing     Locomotion Ambulation   Ambulation assist  Assist level: Supervision/Verbal cueing Assistive device: No Device Max distance: 300'   Walk 10 feet activity   Assist     Assist level: Supervision/Verbal cueing Assistive device: No Device   Walk 50 feet activity   Assist    Assist level: Supervision/Verbal cueing Assistive device: No Device    Walk 150 feet activity   Assist Walk 150 feet activity did not occur: Safety/medical concerns (fatigue, weakness, decreased balance)  Assist level: Supervision/Verbal cueing Assistive device: No Device    Walk 10 feet on uneven surface  activity   Assist     Assist level: Supervision/Verbal cueing Assistive device: Other (comment) (handrail)   Wheelchair     Assist Is the patient using a wheelchair?: No Type of Wheelchair: Manual    Wheelchair assist level: Supervision/Verbal cueing Max wheelchair distance: 39ft    Wheelchair 50 feet with 2 turns activity    Assist        Assist Level: Maximal Assistance - Patient 25 - 49% (per PT note)   Wheelchair 150 feet activity     Assist      Assist Level: Total Assistance - Patient < 25% (per PT note)  BP (!) 141/98 (BP Location: Right Arm)   Pulse 79   Temp 97.6 F (36.4 C) (Oral)   Resp 18   Ht 5\' 3"  (1.6 m)   Wt 67.8 kg   SpO2 100%   BMI 26.48 kg/m    Medical Problem List and Plan: 1.  Deficits in mobility, self-care, cognition secondary to seizures (+ benzos at admit, ? withdrawal, ETOH not checked) (left parietal region) and resulting encephalopathy.  D/c today 2.  Impaired mobility   Mechanical: Sequential compression devices, below knee Bilateral lower extremities Pharmaceutical: d/c  Lovenox              -antiplatelet therapy: ASA 81 3. Pain Management: As needed meds             Team support 4.  Anxiety: continue Klonopin 1 mg twice daily for anxiety             -antipsychotic agents:N/A 5. Neuropsych: This patient is not capable of making decisions on his own behalf.  Geographical information systems officer for safety 6. Skin/Wound Care: Routine skin care 7. Fluids/Electrolytes/Nutrition:              encourage PO -low albumin--protein supp 8.  Essential hypertension             Norvasc 10, Lisinopril increased to 20mg  BID  Hydralazine scheduled started on 10/25, increased on 10/26-elevated blood pressures with correlation to cognition) Vitals:   03/24/21 0021 03/24/21 0424  BP: 101/70 (!) 141/98  Pulse: 78 79  Resp: 14 18  Temp: 97.7 F (36.5 C) 97.6 F (36.4 C)  SpO2: 100% 100%   Medications being adjusted by cardiology-appreciate recommendations  Outpatient cardiology follow-up has been scheduled 9.  Seizures Perampanel 4 mg x 5 days, then 2 mg x 10 days Phenytoin taper completed Vimpat 200 mg twice daily             Keppra 1500 mg twice daily No breakthrough seizures from admission to rehab - 10/27 10.  Diabetes mellitus type 2 with hyperglycemia              Metformin 500 mg twice daily PTA             CBG (last 3)  Recent Labs    03/23/21 1702 03/23/21 2105 03/24/21 0559  GLUCAP 128* 103* 99    Controlled on 10/27 11.  Hyponatremia             resolved   -reduce FR to 1200 cc BMP Latest Ref Rng & Units 03/24/2021 03/23/2021 03/20/2021  Glucose 70 - 99 mg/dL 428(J) 99 681(L)  BUN 6 - 20 mg/dL 20 19 11   Creatinine 0.61 - 1.24 mg/dL 5.72) 6.20(B  BUN/Creat Ratio 9 - 20 - - -  Sodium 135 - 145 mmol/L 132(L) 137 137  Potassium 3.5 - 5.1 mmol/L 3.7 4.0 4.0  Chloride 98 - 111 mmol/L 101 106 103  CO2 22 - 32 mmol/L 24 25 26   Calcium 8.9 - 10.3 mg/dL 5.59) 8.9 9.2    12. Constipation:   Improving 13.  AKI  Resolved w/ med adjustment   >30 minutes spent  in discharge of patient including review of medications and follow-up appointments, physical examination, and in answering all patient's questions   LOS: 13 days A FACE TO FACE EVALUATION WAS PERFORMED  Arthur [name] 03/24/2021, 9:09 AM

## 2021-03-24 NOTE — Progress Notes (Signed)
Patient ID: Arthur [name], male   DOB: [birth year], 60 y.o.   MRN: [id number]  SW called pt wife [name] to inform  continuing to explore [facility] options and waiting on updates. She is aware SW will follow-up once there are more updates.   SW sent referral to Amy/[facility], Angela/[facility], Carolyn/[facility], and Britney/[facility], and waiting on follow-up.   Declined HHAs [facility]  Cecile Sheerer, MSW, [place] Office: [phone] Cell: [phone] Fax: [phone]

## 2021-03-24 NOTE — Progress Notes (Signed)
Subjective:  Patient seen and examined at bedside at approximately 8:15 AM Patient sitting up in chair Denies chest pain, dyspnea, palpitations, dizziness, syncope, near syncope.  He is tolerating antihypertensive medications without issue.  Blood pressure is well controlled and renal function has improved with stopping chlorthalidone.   Echocardiogram done yesterday does reveal mildly reduced LVEF at 40-45% and global hypokinesis with mild inferior and inferoseptal hypokinesis, which is unchanged compared to prior study in 09/2020.  Intake/Output from previous day:  I/O last 3 completed shifts: In: 23 [P.O.:660] Out: 200 [Urine:200] Total I/O In: 240 [P.O.:240] Out: -   Blood pressure (!) 141/98, pulse 79, temperature 97.6 F (36.4 C), temperature source Oral, resp. rate 18, height $RemoveBe'5\' 3"'txQCSibwB$  (1.6 m), weight 67.8 kg, SpO2 100 %. Physical Exam Vitals reviewed.  Cardiovascular:     Rate and Rhythm: Normal rate and regular rhythm.     Pulses: Intact distal pulses.     Heart sounds: S1 normal and S2 normal. No murmur heard.   No gallop.  Pulmonary:     Effort: Pulmonary effort is normal. No respiratory distress.     Breath sounds: No wheezing, rhonchi or rales.  Musculoskeletal:     Right lower leg: No edema.     Left lower leg: No edema.  Neurological:     Mental Status: He is alert.    Lab Results: BMP BNP (last 3 results) Recent Labs    03/02/21 0312  BNP 77.6    ProBNP (last 3 results) No results for input(s): PROBNP in the last 8760 hours. BMP Latest Ref Rng & Units 03/24/2021 03/23/2021 03/20/2021  Glucose 70 - 99 mg/dL 106(H) 99 100(H)  BUN 6 - 20 mg/dL $Remove'20 19 11  'wXJdeqC$ Creatinine 0.61 - 1.24 mg/dL 1.24 1.49(H) 1.04  BUN/Creat Ratio 9 - 20 - - -  Sodium 135 - 145 mmol/L 132(L) 137 137  Potassium 3.5 - 5.1 mmol/L 3.7 4.0 4.0  Chloride 98 - 111 mmol/L 101 106 103  CO2 22 - 32 mmol/L $RemoveB'24 25 26  'fcwekEQh$ Calcium 8.9 - 10.3 mg/dL 8.8(L) 8.9 9.2   Hepatic Function Latest Ref Rng &  Units 03/15/2021 02/28/2021 02/27/2021  Total Protein 6.5 - 8.1 g/dL 7.0 6.4(L) 6.8  Albumin 3.5 - 5.0 g/dL 3.1(L) 3.0(L) 3.2(L)  AST 15 - 41 U/L $Remo'31 15 16  'WnUDI$ ALT 0 - 44 U/L 44 11 11  Alk Phosphatase 38 - 126 U/L 116 66 60  Total Bilirubin 0.3 - 1.2 mg/dL 0.2(L) 0.4 0.7   CBC Latest Ref Rng & Units 03/20/2021 03/13/2021 02/27/2021  WBC 4.0 - 10.5 K/uL 6.3 5.4 7.9  Hemoglobin 13.0 - 17.0 g/dL 13.2 13.0 14.4  Hematocrit 39.0 - 52.0 % 40.9 39.1 42.7  Platelets 150 - 400 K/uL 380 349 233   Lipid Panel     Component Value Date/Time   CHOL 147 03/23/2021 0521   TRIG 127 03/23/2021 0521   HDL 36 (L) 03/23/2021 0521   CHOLHDL 4.1 03/23/2021 0521   VLDL 25 03/23/2021 0521   LDLCALC 86 03/23/2021 0521   LDLDIRECT 124.3 (H) 10/17/2020 0151   Cardiac Panel (last 3 results) No results for input(s): CKTOTAL, CKMB, TROPONINI, RELINDX in the last 72 hours.  HEMOGLOBIN A1C Lab Results  Component Value Date   HGBA1C 6.0 (H) 02/27/2021   MPG 125.5 02/27/2021   TSH Recent Labs    02/27/21 0244  TSH 1.377   Imaging: Chest x-ray single view 02/24/2021: Decreased lung volumes are seen with mild areas  of atelectasis noted within the bilateral lung bases. There is no evidence of a pleural effusion or pneumothorax. The cardiac silhouette is moderately enlarged. This may be technical in origin. The visualized skeletal structures are unremarkable. IMPRESSION: Low lung volumes with mild bibasilar atelectasis.  Cardiac Studies: Echocardiogram 03/23/2021:   1. There is global hypokinesis and inferior and inferoseptal hypokinesis. . Left ventricular ejection fraction, by estimation, is 40 to 45%. Left ventricular ejection fraction by 2D MOD biplane is 43.7 %. The left ventricle has mildly decreased  function. The left ventricle demonstrates regional wall motion abnormalities (see scoring diagram/findings for description). There is moderate concentric left ventricular hypertrophy. Left ventricular  diastolic parameters are consistent with Grade II  diastolic dysfunction (pseudonormalization). Elevated left ventricular end-diastolic pressure. There is mild hypokinesis of the left ventricular, entire inferoseptal wall and inferior segment.  2. Right ventricular systolic function is normal. The right ventricular size is normal.  3. The mitral valve is normal in structure. No evidence of mitral valve regurgitation. No evidence of mitral stenosis.  4. The aortic valve is normal in structure. Aortic valve regurgitation is not visualized. No aortic stenosis is present.  5. The inferior vena cava is normal in size with greater than 50% respiratory variability, suggesting right atrial pressure of 3 mmHg.   Comparison(s): No significant change from prior study. Prior study from 10/17/2020 personally compared. I personally feel there is no significant difference in LVEF or global hypokinesis and mild inferior and inferoseptal l hypokinesis.  EKG:  02/24/2021: Normal sinus rhythm at rate of 97 bpm, biatrial enlargement, baseline artifact, nonspecific T abnormality..  Scheduled Meds:  amLODipine  10 mg Oral Daily   aspirin EC  81 mg Oral Daily   clonazePAM  1 mg Oral BID   cloNIDine  0.2 mg Transdermal Weekly   enoxaparin (LOVENOX) injection  40 mg Subcutaneous Q24H   hydrALAZINE  50 mg Oral Q8H   isosorbide dinitrate  30 mg Oral TID   lacosamide  200 mg Oral BID   levETIRAcetam  1,500 mg Oral BID   lisinopril  20 mg Oral BID   perampanel  2 mg Oral QHS   phenytoin  50 mg Oral Daily   senna  2 tablet Oral Daily   Continuous Infusions: PRN Meds:.acetaminophen **OR** acetaminophen, hydrALAZINE, HYDROcodone-acetaminophen, methocarbamol, midazolam, oxyCODONE  Assessment/Plan:  Patient's creatinine improved from 1.49 to 1.24 with stopping chlorthalidone. Recommend repeat BMP in 1 week post-discharge.  Blood pressure is now well controlled. Continue amlodipine 10 mg p.o. daily, clonidine patch,  hydralazine 50 mg p.o. 3 times daily, Isordil 30 mg p.o. 3 times daily, lisinopril 20 mg p.o. twice daily  Suspect patient's underlying hypertensive cardiomyopathy /nonischemic cardiomyopathy.  Echocardiogram during this admission.  Global hypokinesis with mild inferior wall abnormality and LVEF 40-45%, this is unchanged compared to prior study in 09/2020.  Patient will likely need outpatient stress testing to evaluate for underlying CAD.  There is no clinical evidence of heart failure.  Patient is stable for discharge from a cardiology standpoint.  Absent for outpatient follow-up with our office.  Personally spoke to his wife who is aware of need to follow-up with cardiology outpatient.  Patient was seen in collaboration with Dr. Einar Gip. He also reviewed patient's chart and examined the patient. Dr. Einar Gip is in agreement of the plan.    Alethia Berthold, PA-C 03/24/2021, 9:03 AM Office: [phone]

## 2021-03-28 ENCOUNTER — Telehealth: Payer: Self-pay

## 2021-03-28 ENCOUNTER — Other Ambulatory Visit (HOSPITAL_COMMUNITY): Payer: Self-pay

## 2021-03-28 NOTE — Telephone Encounter (Signed)
SW received updates from [facility] able to accept patient for HHPT/OT/SLP/SW.  SW called pt wife [name] to inform on above, and provided contact information for branch. SW encouraged follow-up if no updates by Friday.     Case closed for SW.   Cecile Sheerer, MSW, LCSWA Office: [phone] Cell: [phone] Fax: [phone]

## 2021-04-03 DIAGNOSIS — G834 Cauda equina syndrome: Secondary | ICD-10-CM | POA: Diagnosis not present

## 2021-04-04 DIAGNOSIS — G834 Cauda equina syndrome: Secondary | ICD-10-CM | POA: Diagnosis not present

## 2021-04-05 ENCOUNTER — Telehealth (INDEPENDENT_AMBULATORY_CARE_PROVIDER_SITE_OTHER): Admitting: Family Medicine

## 2021-04-05 ENCOUNTER — Encounter (HOSPITAL_BASED_OUTPATIENT_CLINIC_OR_DEPARTMENT_OTHER)

## 2021-04-05 DIAGNOSIS — G834 Cauda equina syndrome: Secondary | ICD-10-CM | POA: Diagnosis not present

## 2021-04-05 NOTE — Telephone Encounter (Signed)
The Central Referral Team has prepared this In-Network Referral Order for you.  Your patient provided the information used to complete the details.  In order to attach a Referral Order to you we needed to open a Phone Note Encounter which also needs to be signed.  Please:  1. Review the Order  2. Sign the Order  3. Sign off the Phone Note Encounter    Thanks- Central Referral Team

## 2021-04-06 ENCOUNTER — Ambulatory Visit: Payer: Self-pay

## 2021-04-06 DIAGNOSIS — R569 Unspecified convulsions: Secondary | ICD-10-CM

## 2021-04-06 DIAGNOSIS — G834 Cauda equina syndrome: Secondary | ICD-10-CM | POA: Diagnosis not present

## 2021-04-06 DIAGNOSIS — I10 Essential (primary) hypertension: Secondary | ICD-10-CM

## 2021-04-06 NOTE — Patient Instructions (Signed)
Visit Information  The patient verbalized understanding of instructions, educational materials, and care plan provided today and declined offer to receive copy of patient instructions, educational materials, and care plan.   Telephone follow up appointment with care management team member scheduled for:05/04/21@11 :30  Jodelle Gross, RN, BSN, [facility] Care Management Coordinator [facility] Phone: [phone]/Fax: [phone]

## 2021-04-06 NOTE — Chronic Care Management (AMB) (Signed)
Care Management    RN Visit Note  04/06/2021 Name: Arthur [name] MRN: [id number] DOB: 01-06-1961  Subjective: Arthur [name] is a 60 y.o. year old male who is a primary care patient of Arthur Manes, MD. The care management team was consulted for assistance with disease management and care coordination needs.    Engaged with patient by telephone for follow up visit in response to provider referral for case management and/or care coordination services.   Consent to Services:   [name] was given information about Care Management services today including:  Care Management services includes personalized support from designated clinical staff supervised by his physician, including individualized plan of care and coordination with other care providers 24/7 contact phone numbers for assistance for urgent and routine care needs. The patient may stop case management services at any time by phone call to the office staff.  Patient agreed to services and consent obtained.   Assessment: Review of patient past medical history, allergies, medications, health status, including review of consultants reports, laboratory and other test data, was performed as part of comprehensive evaluation and provision of chronic care management services.   SDOH (Social Determinants of Health) assessments and interventions performed:    Care Plan  Allergies  Allergen Reactions   Penicillins Hives    Did it involve swelling of the face/tongue/throat, SOB, or low BP? Y Did it involve sudden or severe rash/hives, skin peeling, or any reaction on the inside of your mouth or nose? N Did you need to seek medical attention at a hospital or doctor's office? Y When did it last happen?  Over 5 Years Ago     If all above answers are "NO", may proceed with cephalosporin use.     Outpatient Encounter Medications as of 04/06/2021  Medication Sig   acetaminophen (TYLENOL) 325 MG tablet Take 2 tablets (650 mg total) by mouth  every 6 (six) hours as needed for mild pain (or Fever >/= 101).   amLODipine (NORVASC) 10 MG tablet Take 1 tablet (10 mg total) by mouth daily.   aspirin EC 81 MG tablet Take 81 mg by mouth daily. Swallow whole. (Patient not taking: No sig reported)   clonazePAM (KLONOPIN) 1 MG tablet Take 1 tablet (1 mg total) by mouth 2 (two) times daily.   cloNIDine (CATAPRES - DOSED IN MG/24 HR) 0.2 mg/24hr patch Place 1 patch (0.2 mg total) onto the skin once a week.   hydrALAZINE (APRESOLINE) 50 MG tablet Take 1 tablet (50 mg total) by mouth every 8 (eight) hours.   HYDROcodone-acetaminophen (NORCO/VICODIN) 5-325 MG tablet Take 1 tablet by mouth every 4 (four) hours as needed for moderate pain.   isosorbide dinitrate (ISORDIL) 30 MG tablet Take 1 tablet (30 mg total) by mouth 3 (three) times daily.   lacosamide (VIMPAT) 200 MG TABS tablet Take 1 tablet (200 mg total) by mouth 2 (two) times daily.   levETIRAcetam (KEPPRA) 750 MG tablet Take 2 tablets (1,500 mg total) by mouth 2 (two) times daily.   lisinopril (ZESTRIL) 20 MG tablet Take 1 tablet (20 mg total) by mouth 2 (two) times daily.   metFORMIN (GLUCOPHAGE) 500 MG tablet Take 1 tablet (500 mg total) by mouth 2 (two) times daily with a meal. IM PROGRAM   methocarbamol (ROBAXIN) 500 MG tablet Take 1 tablet (500 mg total) by mouth every 6 (six) hours as needed for muscle spasms.   Multiple Vitamin (MULTIVITAMIN WITH MINERALS) TABS tablet Take 1 tablet by mouth  daily. (Patient not taking: No sig reported)   perampanel (FYCOMPA) 2 MG tablet Take 1 tablet (2 mg total) by mouth at bedtime.   phenytoin (DILANTIN) 50 MG tablet Chew 1 tablet (50 mg total) by mouth daily.   senna (SENOKOT) 8.6 MG TABS tablet Take 2 tablets (17.2 mg total) by mouth daily.   No facility-administered encounter medications on file as of 04/06/2021.    Patient Active Problem List   Diagnosis Date Noted   Diabetes mellitus type 2 in nonobese [facility])    Benign essential HTN     Controlled type 2 diabetes mellitus with hyperglycemia, without long-term current use of insulin ([place])    Encephalopathy acute 03/11/2021   Hyponatremia    Seizures ([place]) 02/24/2021   Aortic atherosclerosis ([place]) 11/22/2020   History of seizures 11/22/2020   Alcohol use disorder, severe, dependence ([place]) 11/22/2020   Type 2 diabetes mellitus ([place]) 11/22/2020   Anemia 11/22/2020   Thrombocytopenia ([place]) 11/22/2020   Hyperlipidemia 11/22/2020   Acute encephalopathy 10/16/2020   Transaminitis    Essential hypertension 10/08/2019   History of CVA (cerebrovascular accident) 10/02/2019    Conditions to be addressed/monitored: HTN and Seizure Disorder  Care Plan : Hypertension (Adult)  Updates made by [name], RN since 04/06/2021 12:00 AM     Problem: Disease Progression (Hypertension)   Priority: High  Onset Date: 05/24/2020     Goal: Disease Progression Prevented or Minimized   Start Date: 05/24/2020  Recent Progress: On track  Priority: High  Note:   Objective: Successful outreach to patient and his wife.  Patient was finally approved for Medicaid and he is making progress since his admission to acute hospital and acute rehab last month.   Patients spouse noted patient has not had any seizure activity since discharge.  Patient has a home health aide that has been coming in daily and assists him with ambulation and bathing.  He will be starting PT/OT/ST in the near future.  Patient will have a follow up with cardiologist and he will call to make a follow up at the clinic.  Last practice recorded BP readings:  BP Readings from Last 3 Encounters:  03/24/21 (!) 141/98  03/11/21 121/83  01/18/21 (!) 167/99   Most recent eGFR/CrCl: No results found for: EGFR  No components found for: CRCL Current Barriers:  Difficulty obtaining medications- Patient was approved for Medicaid so he should have no further difficulties.  He is waiting on appeal of his disability.  Case Manager  Clinical Goal(s):  Over the next 30-60 days, patient will verbalize understanding of plan for hypertension management Interventions:  Evaluation of current treatment plan related to hypertension self management and patient's adherence to plan as established by provider. Discussed importance of good medication taking behavior to lower risk of another stroke or seizure-  Reviewed the blood pressure treatment targets and explained why it is important to record pulse if BP readings are low Requested patient's wife keep this CCM RN updated on status of Medicaid and long term disability appeal Advised patient, providing education and rationale, to monitor blood pressure daily and record, calling PCP for findings outside established parameters.  Reviewed scheduled/upcoming provider appointments including: to establish with neurologist on 01/18/21. Patient Goals/Self-Care Activities Over the next 30-60 days, patient will:  - Self administers medications as prescribed Attends all scheduled provider appointments Calls provider office for new concerns, questions, or BP outside discussed parameters Checks BP and records as discussed Follows a low sodium diet/DASH diet Follow  Up Plan: The care management team will reach out to the patient again over the next 30-60 days.       Plan: Telephone follow up appointment with care management team member scheduled for:  78 days  [name], RN, BSN, CCM Care Management Coordinator [facility] Phone: [phone]: [phone]

## 2021-04-07 DIAGNOSIS — G834 Cauda equina syndrome: Secondary | ICD-10-CM | POA: Diagnosis not present

## 2021-04-10 DIAGNOSIS — G834 Cauda equina syndrome: Secondary | ICD-10-CM | POA: Diagnosis not present

## 2021-04-11 ENCOUNTER — Ambulatory Visit: Payer: Medicaid Other | Admitting: Student

## 2021-04-11 DIAGNOSIS — G834 Cauda equina syndrome: Secondary | ICD-10-CM | POA: Diagnosis not present

## 2021-04-12 DIAGNOSIS — G834 Cauda equina syndrome: Secondary | ICD-10-CM | POA: Diagnosis not present

## 2021-04-13 ENCOUNTER — Ambulatory Visit: Payer: Medicaid Other | Admitting: Student

## 2021-04-13 DIAGNOSIS — G834 Cauda equina syndrome: Secondary | ICD-10-CM | POA: Diagnosis not present

## 2021-04-17 ENCOUNTER — Telehealth (INDEPENDENT_AMBULATORY_CARE_PROVIDER_SITE_OTHER)

## 2021-04-17 DIAGNOSIS — G834 Cauda equina syndrome: Secondary | ICD-10-CM | POA: Diagnosis not present

## 2021-04-18 DIAGNOSIS — G834 Cauda equina syndrome: Secondary | ICD-10-CM | POA: Diagnosis not present

## 2021-04-19 DIAGNOSIS — G834 Cauda equina syndrome: Secondary | ICD-10-CM | POA: Diagnosis not present

## 2021-04-24 ENCOUNTER — Telehealth (INDEPENDENT_AMBULATORY_CARE_PROVIDER_SITE_OTHER): Admitting: Family Medicine

## 2021-04-24 NOTE — Telephone Encounter (Signed)
 swp- informed him nothing earlier on that day currently - will keep him in mind is morning times open up

## 2021-04-24 NOTE — Telephone Encounter (Signed)
 Patient states that he has a colonoscopy scheduled for 05/08/21 at 11:30am but would like to know if theres is an earlier time frame.    Please advise    Call back [phone]

## 2021-04-26 ENCOUNTER — Telehealth (INDEPENDENT_AMBULATORY_CARE_PROVIDER_SITE_OTHER): Admitting: Internal Medicine

## 2021-04-26 DIAGNOSIS — G834 Cauda equina syndrome: Secondary | ICD-10-CM | POA: Diagnosis not present

## 2021-04-26 NOTE — Telephone Encounter (Signed)
 SWP, Confirmed 05/08/2021 [facility].  Arrival-Y, Ride-Y, Prep-Y, Vaccinated-Y (hasn't Tested + in 90days

## 2021-04-27 NOTE — Progress Notes (Signed)
Primary Physician/Referring:  [name], MD  Patient ID: Arthur [name], male    DOB: May 10, 1961, 60 y.o.   MRN: [id number]  Chief Complaint  Patient presents with   Hypertension   Follow-up   Hospitalization Follow-up   HPI:    Arthur [name]  is a 60 y.o. AA male with history of seizure disorder, hypertension, prediabetes, alcohol abuse, tobacco use.    Patient was admitted 03/12/2019 - 03/16/2021 with acute encephalopathy.  Cardiology was consulted for management of hypertension at that time.  Echocardiogram during hospitalization suggested underlying cardiomyopathy without heart failure may be related to alcohol abuse versus uncontrolled hypertension.  Noted global hypokinesis with mild inferior wall motion abnormality, recommended outpatient stress testing.  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  Patient was discharged on amlodipine, hydralazine, Isordil, lisinopril, and clonidine patch. He now presents for follow up.   Patient now has home health nurse comes to the house 5 days/week.  Reports home blood pressure readings averaging 130/90 mmHg with heart rate averaging 90 bpm.  Patient has refrain from alcohol use.  Notably patient took antihypertensive medications just prior to today's office visit, which may be contributing to elevated blood pressure at this time.  Patient is otherwise feeling well.  Denies chest pain, palpitations, syncope, near syncope, dyspnea, leg edema, orthopnea.  Patient's home health nurse is present at bedside and his wife is on the phone, both contribute to history as well.  Past Medical History:  Diagnosis Date   DM2 (diabetes mellitus, type 2) ([place])    ETOH abuse    Hypertension    Seizures ([place])    Past Surgical History:  Procedure Laterality Date   HEMORRHOID SURGERY     Family History  Problem Relation Age of Onset   Diabetes Mother    Hypertension Mother    Diabetes Father    Hypertension Father     Hypertension Sister     Social History   Tobacco Use   Smoking status: Every Day    Types: Cigars   Smokeless tobacco: Never   Tobacco comments:    Smokes black& mild - 1 a day.  Substance Use Topics   Alcohol use: Not Currently   Marital Status: Married   ROS  Review of Systems  Constitutional: Negative for malaise/fatigue and weight gain.  Cardiovascular:  Negative for chest pain, claudication, leg swelling, near-syncope, orthopnea, palpitations, paroxysmal nocturnal dyspnea and syncope.  Respiratory:  Negative for shortness of breath.   Neurological:  Negative for dizziness.   Objective  Blood pressure (!) 140/91, pulse (!) 103, temperature 97.8 F (36.6 C), height 5\' 3"  (1.6 m), weight 155 lb (70.3 kg), SpO2 96 %.  Vitals with BMI 04/28/2021 04/28/2021 03/24/2021  Height - 5\' 3"  -  Weight - 155 lbs -  BMI - 0000000 -  Systolic [id number]  Diastolic 91 94 98  Pulse [id number] 100 79      Physical Exam Vitals reviewed.  HENT:     Head: Normocephalic and atraumatic.  Cardiovascular:     Rate and Rhythm: Normal rate and regular rhythm.     Pulses: Intact distal pulses.     Heart sounds: S1 normal and S2 normal. No murmur heard.   No gallop.  Pulmonary:     Effort: Pulmonary effort is normal. No respiratory distress.     Breath sounds: No wheezing, rhonchi or rales.  Musculoskeletal:     Right lower leg: No edema.     Left  lower leg: No edema.  Neurological:     Mental Status: He is alert.    Laboratory examination:   Recent Labs    03/20/21 0540 03/23/21 0521 03/24/21 0505  NA 137 137 132*  K 4.0 4.0 3.7  CL 103 106 101  CO2 26 25 24   GLUCOSE 100* 99 106*  BUN 11 19 20   CREATININE 1.04 1.49* 1.24  CALCIUM 9.2 8.9 8.8*  GFRNONAA >60 54* >60   CrCl cannot be calculated (Patient's most recent lab result is older than the maximum 21 days allowed.).  CMP Latest Ref Rng & Units 03/24/2021 03/23/2021 03/20/2021  Glucose 70 - 99 mg/dL 106(H) 99 100(H)  BUN 6 -  20 mg/dL 20 19 11   Creatinine 0.61 - 1.24 mg/dL 1.24 1.49(H) 1.04  Sodium 135 - 145 mmol/L 132(L) 137 137  Potassium 3.5 - 5.1 mmol/L 3.7 4.0 4.0  Chloride 98 - 111 mmol/L 101 106 103  CO2 22 - 32 mmol/L 24 25 26   Calcium 8.9 - 10.3 mg/dL 8.8(L) 8.9 9.2  Total Protein 6.5 - 8.1 g/dL - - -  Total Bilirubin 0.3 - 1.2 mg/dL - - -  Alkaline Phos 38 - 126 U/L - - -  AST 15 - 41 U/L - - -  ALT 0 - 44 U/L - - -   CBC Latest Ref Rng & Units 03/20/2021 03/13/2021 02/27/2021  WBC 4.0 - 10.5 K/uL 6.3 5.4 7.9  Hemoglobin 13.0 - 17.0 g/dL 13.2 13.0 14.4  Hematocrit 39.0 - 52.0 % 40.9 39.1 42.7  Platelets 150 - 400 K/uL 380 349 233    Lipid Panel Recent Labs    10/17/20 0151 10/18/20 0735 03/23/21 0521  CHOL 217*  --  147  TRIG 480* 485* 127  LDLCALC UNABLE TO CALCULATE IF TRIGLYCERIDE OVER 400 mg/dL  --  86  VLDL UNABLE TO CALCULATE IF TRIGLYCERIDE OVER 400 mg/dL  --  25  HDL <10*  --  36*  CHOLHDL NOT CALCULATED  --  4.1  LDLDIRECT 124.3*  --   --     HEMOGLOBIN A1C Lab Results  Component Value Date   HGBA1C 6.0 (H) 02/27/2021   MPG 125.5 02/27/2021   TSH Recent Labs    02/27/21 0244  TSH 1.377    External labs:  None   Allergies   Allergies  Allergen Reactions   Penicillins Hives    Did it involve swelling of the face/tongue/throat, SOB, or low BP? Y Did it involve sudden or severe rash/hives, skin peeling, or any reaction on the inside of your mouth or nose? N Did you need to seek medical attention at a hospital or doctor's office? Y When did it last happen?  Over 5 Years Ago     If all above answers are "NO", may proceed with cephalosporin use.     Medications Prior to Visit:   Outpatient Medications Prior to Visit  Medication Sig Dispense Refill   acetaminophen (TYLENOL) 325 MG tablet Take 2 tablets (650 mg total) by mouth every 6 (six) hours as needed for mild pain (or Fever >/= 101).     clonazePAM (KLONOPIN) 1 MG tablet Take 1 tablet (1 mg total) by mouth  2 (two) times daily. 60 tablet 0   cloNIDine (CATAPRES - DOSED IN MG/24 HR) 0.2 mg/24hr patch Place 1 patch (0.2 mg total) onto the skin once a week. 4 patch 12   lacosamide (VIMPAT) 200 MG TABS tablet Take 1 tablet (200 mg total) by  mouth 2 (two) times daily. 60 tablet 0   levETIRAcetam (KEPPRA) 750 MG tablet Take 2 tablets (1,500 mg total) by mouth 2 (two) times daily. 120 tablet 0   metFORMIN (GLUCOPHAGE) 500 MG tablet Take 1 tablet (500 mg total) by mouth 2 (two) times daily with a meal. IM PROGRAM 60 tablet 0   amLODipine (NORVASC) 10 MG tablet Take 1 tablet (10 mg total) by mouth daily. 30 tablet 0   hydrALAZINE (APRESOLINE) 50 MG tablet Take 1 tablet (50 mg total) by mouth every 8 (eight) hours. 90 tablet 0   isosorbide dinitrate (ISORDIL) 30 MG tablet Take 1 tablet (30 mg total) by mouth 3 (three) times daily. 90 tablet 0   lisinopril (ZESTRIL) 20 MG tablet Take 1 tablet (20 mg total) by mouth 2 (two) times daily. 60 tablet 0   aspirin EC 81 MG tablet Take 81 mg by mouth daily. Swallow whole. (Patient not taking: Reported on 02/24/2021)     HYDROcodone-acetaminophen (NORCO/VICODIN) 5-325 MG tablet Take 1 tablet by mouth every 4 (four) hours as needed for moderate pain. (Patient not taking: Reported on 04/28/2021) 30 tablet 0   methocarbamol (ROBAXIN) 500 MG tablet Take 1 tablet (500 mg total) by mouth every 6 (six) hours as needed for muscle spasms. (Patient not taking: Reported on 04/28/2021) 60 tablet 0   Multiple Vitamin (MULTIVITAMIN WITH MINERALS) TABS tablet Take 1 tablet by mouth daily. (Patient not taking: No sig reported)     perampanel (FYCOMPA) 2 MG tablet Take 1 tablet (2 mg total) by mouth at bedtime. (Patient not taking: Reported on 04/28/2021) 3 tablet 0   phenytoin (DILANTIN) 50 MG tablet Chew 1 tablet (50 mg total) by mouth daily. (Patient not taking: Reported on 04/28/2021) 3 tablet 0   senna (SENOKOT) 8.6 MG TABS tablet Take 2 tablets (17.2 mg total) by mouth daily. (Patient not  taking: Reported on 04/28/2021) 120 tablet 0   No facility-administered medications prior to visit.   Final Medications at End of Visit    Current Meds  Medication Sig   acetaminophen (TYLENOL) 325 MG tablet Take 2 tablets (650 mg total) by mouth every 6 (six) hours as needed for mild pain (or Fever >/= 101).   clonazePAM (KLONOPIN) 1 MG tablet Take 1 tablet (1 mg total) by mouth 2 (two) times daily.   cloNIDine (CATAPRES - DOSED IN MG/24 HR) 0.2 mg/24hr patch Place 1 patch (0.2 mg total) onto the skin once a week.   lacosamide (VIMPAT) 200 MG TABS tablet Take 1 tablet (200 mg total) by mouth 2 (two) times daily.   levETIRAcetam (KEPPRA) 750 MG tablet Take 2 tablets (1,500 mg total) by mouth 2 (two) times daily.   metFORMIN (GLUCOPHAGE) 500 MG tablet Take 1 tablet (500 mg total) by mouth 2 (two) times daily with a meal. IM PROGRAM   metoprolol tartrate (LOPRESSOR) 50 MG tablet Take 1 tablet (50 mg total) by mouth 2 (two) times daily.   [DISCONTINUED] amLODipine (NORVASC) 10 MG tablet Take 1 tablet (10 mg total) by mouth daily.   [DISCONTINUED] hydrALAZINE (APRESOLINE) 50 MG tablet Take 1 tablet (50 mg total) by mouth every 8 (eight) hours.   [DISCONTINUED] isosorbide dinitrate (ISORDIL) 30 MG tablet Take 1 tablet (30 mg total) by mouth 3 (three) times daily.   [DISCONTINUED] lisinopril (ZESTRIL) 20 MG tablet Take 1 tablet (20 mg total) by mouth 2 (two) times daily.   Radiology:   No results found.  Cardiac Studies:   Echocardiogram  03/22/2021: 1. There is global hypokinesis and inferior and inferoseptal hypokinesis.  . Left ventricular ejection fraction, by estimation, is 40 to 45%. Left  ventricular ejection fraction by 2D MOD biplane is 43.7 %. The left  ventricle has mildly decreased  function. The left ventricle demonstrates regional wall motion  abnormalities (see scoring diagram/findings for description). There is  moderate concentric left ventricular hypertrophy. Left  ventricular  diastolic parameters are consistent with Grade II  diastolic dysfunction (pseudonormalization). Elevated left ventricular  end-diastolic pressure. There is mild hypokinesis of the left ventricular,  entire inferoseptal wall and inferior segment.   2. Right ventricular systolic function is normal. The right ventricular  size is normal.   3. The mitral valve is normal in structure. No evidence of mitral valve  regurgitation. No evidence of mitral stenosis.   4. The aortic valve is normal in structure. Aortic valve regurgitation is  not visualized. No aortic stenosis is present.   5. The inferior vena cava is normal in size with greater than 50%  respiratory variability, suggesting right atrial pressure of 3 mmHg.   EKG:   02/24/2021: Normal sinus rhythm at rate of 97 bpm, biatrial enlargement, baseline artifact, nonspecific T abnormality..  Assessment     ICD-10-CM   1. Essential hypertension  I10 PCV MYOCARDIAL PERFUSION WITH LEXISCAN    2. Left ventricular dysfunction  I51.9 PCV MYOCARDIAL PERFUSION WITH LEXISCAN    3. Hypertensive heart disease without heart failure  I11.9 PCV MYOCARDIAL PERFUSION WITH LEXISCAN       Medications Discontinued During This Encounter  Medication Reason   amLODipine (NORVASC) 10 MG tablet Reorder   lisinopril (ZESTRIL) 20 MG tablet Reorder   hydrALAZINE (APRESOLINE) 50 MG tablet Reorder   isosorbide dinitrate (ISORDIL) 30 MG tablet Reorder    Meds ordered this encounter  Medications   metoprolol tartrate (LOPRESSOR) 50 MG tablet    Sig: Take 1 tablet (50 mg total) by mouth 2 (two) times daily.    Dispense:  180 tablet    Refill:  3   amLODipine (NORVASC) 10 MG tablet    Sig: Take 1 tablet (10 mg total) by mouth daily.    Dispense:  90 tablet    Refill:  3    IM PROGRAM   hydrALAZINE (APRESOLINE) 50 MG tablet    Sig: Take 1 tablet (50 mg total) by mouth every 8 (eight) hours.    Dispense:  270 tablet    Refill:  3    isosorbide dinitrate (ISORDIL) 30 MG tablet    Sig: Take 1 tablet (30 mg total) by mouth 3 (three) times daily.    Dispense:  270 tablet    Refill:  3   lisinopril (ZESTRIL) 20 MG tablet    Sig: Take 1 tablet (20 mg total) by mouth 2 (two) times daily.    Dispense:  180 tablet    Refill:  3    Recommendations:   VIGO TANSKI is a 60 y.o. AA male with history of seizure disorder, hypertension, prediabetes, alcohol abuse, tobacco use.    Patient was admitted 03/12/2019 - 03/16/2021 with acute encephalopathy.  Cardiology was consulted for management of hypertension at that time.  Echocardiogram during hospitalization suggested underlying cardiomyopathy without heart failure may be related to alcohol abuse versus uncontrolled hypertension.  Noted global hypokinesis with mild inferior wall motion abnormality, recommended outpatient stress testing.  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  Patient was discharged on amlodipine, hydralazine, Isordil, lisinopril, and clonidine patch.  He now presents for follow up.   Is feeling well since discharge from the hospital.  There is no clinical evidence of acute decompensated heart failure.  He remains relatively asymptomatic.  Patient's blood pressure is mildly elevated at today's office visit, however suspect this is due to the patient has only just taken his antihypertensive medications before today's visit.  Home blood pressure readings are relatively well controlled.  Given LV dysfunction and elevated heart rate and blood pressure will initiate Lopressor 50 mg p.o. twice daily.  Patient's home health agency will continue to monitor heart rate and blood pressure closely.  Counseled patient and his wife and home health nurse regarding signs and symptoms that would warrant urgent or emergent evaluation.  Due to new onset LV dysfunction recommend nuclear stress test.  Patient is not a candidate for the treadmill given gait  imbalance.  Suspect patient's cardiomyopathy is related to history of alcohol abuse or uncontrolled hypertension, however ischemic heart disease cannot be ruled out will therefore proceed with stress test.  In regards to LV dysfunction continue hydralazine, Isordil, lisinopril, and add metoprolol.  Follow-up in 6 weeks, sooner if needed, for results of stress test and follow-up of LV dysfunction and hypertension.   Alethia Berthold, PA-C 04/28/2021, 2:01 PM Office: [phone]

## 2021-04-28 ENCOUNTER — Other Ambulatory Visit: Payer: Self-pay

## 2021-04-28 ENCOUNTER — Encounter: Payer: Self-pay | Admitting: Student

## 2021-04-28 ENCOUNTER — Ambulatory Visit: Payer: Medicaid Other | Admitting: Student

## 2021-04-28 VITALS — BP 140/91 | HR 103 | Temp 97.8°F | Ht 63.0 in | Wt 155.0 lb

## 2021-04-28 DIAGNOSIS — I10 Essential (primary) hypertension: Secondary | ICD-10-CM

## 2021-04-28 DIAGNOSIS — I519 Heart disease, unspecified: Secondary | ICD-10-CM

## 2021-04-28 DIAGNOSIS — I119 Hypertensive heart disease without heart failure: Secondary | ICD-10-CM

## 2021-04-28 MED ORDER — METOPROLOL TARTRATE 50 MG PO TABS
50.0000 mg | ORAL_TABLET | Freq: Two times a day (BID) | ORAL | 3 refills | Status: DC
  Filled 2021-04-28 – 2021-08-22 (×2): qty 180, 90d supply, fill #0

## 2021-04-28 MED ORDER — LISINOPRIL 20 MG PO TABS
20.0000 mg | ORAL_TABLET | Freq: Two times a day (BID) | ORAL | 3 refills | Status: DC
  Filled 2021-04-28: qty 180, 90d supply, fill #0

## 2021-04-28 MED ORDER — AMLODIPINE BESYLATE 10 MG PO TABS
10.0000 mg | ORAL_TABLET | Freq: Every day | ORAL | 3 refills | Status: DC
  Filled 2021-04-28 – 2021-11-27 (×3): qty 90, 90d supply, fill #0
  Filled 2021-11-27: qty 90, 90d supply, fill #1
  Filled 2021-11-27: qty 90, 90d supply, fill #0

## 2021-04-28 MED ORDER — ISOSORBIDE DINITRATE 30 MG PO TABS
30.0000 mg | ORAL_TABLET | Freq: Three times a day (TID) | ORAL | 3 refills | Status: DC
  Filled 2021-04-28 – 2021-08-22 (×2): qty 270, 90d supply, fill #0

## 2021-04-28 MED ORDER — HYDRALAZINE HCL 50 MG PO TABS
50.0000 mg | ORAL_TABLET | Freq: Three times a day (TID) | ORAL | 3 refills | Status: DC
  Filled 2021-04-28 – 2021-08-22 (×2): qty 270, 90d supply, fill #0

## 2021-05-01 ENCOUNTER — Encounter (INDEPENDENT_AMBULATORY_CARE_PROVIDER_SITE_OTHER): Admitting: Family Medicine

## 2021-05-01 ENCOUNTER — Other Ambulatory Visit

## 2021-05-01 ENCOUNTER — Other Ambulatory Visit: Payer: Self-pay

## 2021-05-04 ENCOUNTER — Ambulatory Visit: Payer: Medicaid Other

## 2021-05-04 NOTE — Chronic Care Management (AMB) (Signed)
Care Management    RN Visit Note  05/04/2021 Name: Arthur [name] MRN: [id number] DOB: Oct 12, 1960  Subjective: Arthur [name] is a 60 y.o. year old male who is a primary care patient of Arthur Manes, MD. The care management team was consulted for assistance with disease management and care coordination needs.    Engaged with patient by telephone for follow up visit in response to provider referral for case management and/or care coordination services.   Consent to Services:   Arthur [name] was given information about Care Management services today including:  Care Management services includes personalized support from designated clinical staff supervised by his physician, including individualized plan of care and coordination with other care providers 24/7 contact phone numbers for assistance for urgent and routine care needs. The patient may stop case management services at any time by phone call to the office staff.  Patient agreed to services and consent obtained.   Assessment: Review of patient past medical history, allergies, medications, health status, including review of consultants reports, laboratory and other test data, was performed as part of comprehensive evaluation and provision of chronic care management services.   SDOH (Social Determinants of Health) assessments and interventions performed:    Care Plan  Allergies  Allergen Reactions   Penicillins Hives    Did it involve swelling of the face/tongue/throat, SOB, or low BP? Y Did it involve sudden or severe rash/hives, skin peeling, or any reaction on the inside of your mouth or nose? N Did you need to seek medical attention at a [name] or doctor's office? Y When did it last happen?  Over 5 Years Ago     If all above answers are "NO", may proceed with cephalosporin use.     Outpatient Encounter Medications as of 05/04/2021  Medication Sig   acetaminophen (TYLENOL) 325 MG tablet Take 2 tablets (650 mg total) by mouth  every 6 (six) hours as needed for mild pain (or Fever >/= 101).   amLODipine (NORVASC) 10 MG tablet Take 1 tablet (10 mg total) by mouth daily.   aspirin EC 81 MG tablet Take 81 mg by mouth daily. Swallow whole. (Patient not taking: Reported on 02/24/2021)   clonazePAM (KLONOPIN) 1 MG tablet Take 1 tablet (1 mg total) by mouth 2 (two) times daily.   cloNIDine (CATAPRES - DOSED IN MG/24 HR) 0.2 mg/24hr patch Place 1 patch (0.2 mg total) onto the skin once a week.   hydrALAZINE (APRESOLINE) 50 MG tablet Take 1 tablet (50 mg total) by mouth every 8 (eight) hours.   HYDROcodone-acetaminophen (NORCO/VICODIN) 5-325 MG tablet Take 1 tablet by mouth every 4 (four) hours as needed for moderate pain. (Patient not taking: Reported on 04/28/2021)   isosorbide dinitrate (ISORDIL) 30 MG tablet Take 1 tablet (30 mg total) by mouth 3 (three) times daily.   lacosamide (VIMPAT) 200 MG TABS tablet Take 1 tablet (200 mg total) by mouth 2 (two) times daily.   levETIRAcetam (KEPPRA) 750 MG tablet Take 2 tablets (1,500 mg total) by mouth 2 (two) times daily.   lisinopril (ZESTRIL) 20 MG tablet Take 1 tablet (20 mg total) by mouth 2 (two) times daily.   metFORMIN (GLUCOPHAGE) 500 MG tablet Take 1 tablet (500 mg total) by mouth 2 (two) times daily with a meal. IM PROGRAM   methocarbamol (ROBAXIN) 500 MG tablet Take 1 tablet (500 mg total) by mouth every 6 (six) hours as needed for muscle spasms. (Patient not taking: Reported on 04/28/2021)  metoprolol tartrate (LOPRESSOR) 50 MG tablet Take 1 tablet (50 mg total) by mouth 2 (two) times daily.   Multiple Vitamin (MULTIVITAMIN WITH MINERALS) TABS tablet Take 1 tablet by mouth daily. (Patient not taking: No sig reported)   perampanel (FYCOMPA) 2 MG tablet Take 1 tablet (2 mg total) by mouth at bedtime. (Patient not taking: Reported on 04/28/2021)   phenytoin (DILANTIN) 50 MG tablet Chew 1 tablet (50 mg total) by mouth daily. (Patient not taking: Reported on 04/28/2021)   senna  (SENOKOT) 8.6 MG TABS tablet Take 2 tablets (17.2 mg total) by mouth daily. (Patient not taking: Reported on 04/28/2021)   No facility-administered encounter medications on file as of 05/04/2021.    Patient Active Problem List   Diagnosis Date Noted   Diabetes mellitus type 2 in nonobese [facility])    Benign essential HTN    Controlled type 2 diabetes mellitus with hyperglycemia, without long-term current use of insulin ([place])    Encephalopathy acute 03/11/2021   Hyponatremia    Seizures ([place]) 02/24/2021   Aortic atherosclerosis ([place]) 11/22/2020   History of seizures 11/22/2020   Alcohol use disorder, severe, dependence ([place]) 11/22/2020   Type 2 diabetes mellitus ([place]) 11/22/2020   Anemia 11/22/2020   Thrombocytopenia ([place]) 11/22/2020   Hyperlipidemia 11/22/2020   Acute encephalopathy 10/16/2020   Transaminitis    Essential hypertension 10/08/2019   History of CVA (cerebrovascular accident) 10/02/2019    Conditions to be addressed/monitored: HTN, DMII, and Seizure disorder  Care Plan : Hypertension (Adult)  Updates made by Arthur Killian, RN since 05/04/2021 12:00 AM     Problem: Disease Progression (Hypertension)   Priority: High  Onset Date: 05/24/2020     Goal: Disease Progression Prevented or Minimized   Start Date: 05/24/2020  Recent Progress: On track  Priority: High  Note:   Current Barriers: Successful outreach to patient and his spouse, Arthur [name].  Patient has not received any home health services since his discharge from acute rehab in early November.  Per chart review, patient was accepted by Arthur [name] and was suppose to have OT/PT/ST upon discharge.  Patients spouse notes that they never received any calls from Arthur [name]  and his spouse notes that he would really benefit from the therapy.  Phone call  outreach to Arthur [name] at Arthur [name] who stated that they agency attempted to call the patient for one week and then notified the ordering provider that they could not reach the patient  and canceled the order.  Per Arthur [name], they would need new orders to see if they have the staff to accept the patient for services.  Message to [facility] front desk staff to contact patient/spouse to schedule a visit to obtain new HH OT, PT, ST orders.   Patient had a recent visit at cardiologist, BP 140/91, patients spouse shared that she takes patients blood pressure frequently and the reading at the cardiologist was high for him.  Patient will be having a stress test and follow up with the cardiologist.   *Patient needs refill on Vimpat (Lacosamide)241m BID and Keppra (Levetiracetam) 1500 mg BID.  Message sent to [facility] red team to request refills to be sent to [facility]  RNCM to follow up with patient after MD review of medication request.  Chronic Disease Management support and education needs related to HTN, DMII, and Seizure disorder [employer].  Difficulty obtaining medications  RNCM Clinical Goal(s):  Patient will verbalize understanding of plan for management of HTN, DMII, and Seizure disorder  as evidenced by interactive conversation with RNCM. continue to work with Consulting civil engineer and/or Social Worker to address care management and care coordination needs related to HTN, DMII, and Seizure disorder as evidenced by adherence to CM Team Scheduled appointments     experience decrease in ED visits as evidenced by EMR review; ED visits in last 6 months = 2  through collaboration with RN Care manager, provider, and care team.   Interventions: 1:1 collaboration with primary care provider regarding development and update of comprehensive plan of care as evidenced by provider attestation and co-signature Inter-disciplinary care team collaboration (see longitudinal plan of care) Evaluation of current treatment plan related to  self management and patient's adherence to plan as established by provider  Patient Goals/Self-Care Activities: Take medications as prescribed    Attend all scheduled provider appointments Call pharmacy for medication refills 3-7 days in advance of running out of medications Perform IADL's (shopping, preparing meals, housekeeping, managing finances) independently Call provider office for new concerns or questions  Work with the social worker to address care coordination needs and will continue to work with the clinical team to address health care and disease management related needs check feet daily for cuts, sores or redness take the blood sugar meter to all doctor visits drink 6 to 8 glasses of water each day read food labels for fat, fiber, carbohydrates and portion size check blood pressure 3 times per week write blood pressure results in a log or diary keep a blood pressure log take blood pressure log to all doctor appointments keep all doctor appointments take medications for blood pressure exactly as prescribed  Last practice recorded BP readings:  BP Readings from Last 3 Encounters:  04/28/21 (!) 140/91  03/24/21 (!) 141/98  03/11/21 121/83   Most recent eGFR/CrCl: No results found for: EGFR  No components found for: CRCL Follow Up Plan: The care management team will reach out to the patient again over the next 30-60 days.      Plan: The patient has been provided with contact information for the care management team and has been advised to call with any health related questions or concerns.   Arthur Killian, RN, BSN, CCM Care Management Coordinator [name] Phone: [phone]: [phone]

## 2021-05-04 NOTE — Patient Instructions (Signed)
Visit Information  Thank you for taking time to visit with me today. Please don't hesitate to contact me if I can be of assistance to you before our next scheduled telephone appointment.  Please call the care guide team at [phone] if you need to cancel or reschedule your appointment.   If you are experiencing a Mental Health or Behavioral Health Crisis or need someone to talk to, please call the [place] National Suicide Prevention Lifeline: 808-199-6864 or TTY: (819)430-2442 TTY 865-718-4164) to talk to a trained counselor   The patient verbalized understanding of instructions, educational materials, and care plan provided today and declined offer to receive copy of patient instructions, educational materials, and care plan.   Telephone follow up appointment with care management team member scheduled for: To be scheduled after MD appointment.  Jodelle Gross, RN, BSN, CCM Care Management Coordinator [facility] Internal Medicine Phone: [phone]/Fax: [phone]

## 2021-05-05 ENCOUNTER — Other Ambulatory Visit: Payer: Self-pay | Admitting: Student

## 2021-05-05 ENCOUNTER — Other Ambulatory Visit: Payer: Self-pay

## 2021-05-05 DIAGNOSIS — R569 Unspecified convulsions: Secondary | ICD-10-CM

## 2021-05-05 MED ORDER — LACOSAMIDE 200 MG PO TABS
200.0000 mg | ORAL_TABLET | Freq: Two times a day (BID) | ORAL | 2 refills | Status: DC
  Filled 2021-05-05: qty 60, 30d supply, fill #0
  Filled 2021-05-05: qty 120, 60d supply, fill #0

## 2021-05-05 MED ORDER — LEVETIRACETAM 750 MG PO TABS
1500.0000 mg | ORAL_TABLET | Freq: Two times a day (BID) | ORAL | 3 refills | Status: DC
  Filled 2021-05-05 (×2): qty 120, 30d supply, fill #0

## 2021-05-05 MED ORDER — LACOSAMIDE 200 MG PO TABS
200.0000 mg | ORAL_TABLET | Freq: Two times a day (BID) | ORAL | 2 refills | Status: DC
  Filled 2021-05-05 – 2021-07-20 (×2): qty 60, 30d supply, fill #0

## 2021-05-05 MED ORDER — LEVETIRACETAM 750 MG PO TABS: 1500.0000 mg | ORAL_TABLET | Freq: Two times a day (BID) | ORAL | 3 refills | Status: DC

## 2021-05-08 ENCOUNTER — Ambulatory Visit (HOSPITAL_BASED_OUTPATIENT_CLINIC_OR_DEPARTMENT_OTHER): Admitting: Anesthesiology

## 2021-05-08 ENCOUNTER — Encounter

## 2021-05-08 ENCOUNTER — Ambulatory Visit
Admission: RE | Admit: 2021-05-08 | Discharge: 2021-05-08 | Disposition: A | Discharge status: Home / Self Care | Source: Ambulatory Visit | Attending: Internal Medicine | Admitting: Internal Medicine

## 2021-05-08 ENCOUNTER — Other Ambulatory Visit

## 2021-05-08 ENCOUNTER — Encounter (HOSPITAL_BASED_OUTPATIENT_CLINIC_OR_DEPARTMENT_OTHER)

## 2021-05-08 ENCOUNTER — Other Ambulatory Visit: Payer: Self-pay

## 2021-05-08 DIAGNOSIS — Z1211 Encounter for screening for malignant neoplasm of colon: Secondary | ICD-10-CM

## 2021-05-08 MED ORDER — sodium chloride 0.9 % infusion
0.9 | INTRAVENOUS | Status: DC | PRN
Start: 2021-05-08 — End: 2021-05-08
  Administered 2021-05-08: 17:00:00 via INTRAVENOUS

## 2021-05-08 MED ORDER — propofol (Diprivan) injection
10 | INTRAVENOUS | Status: DC | PRN
Start: 2021-05-08 — End: 2021-05-08
  Administered 2021-05-08: 17:00:00 200 via INTRAVENOUS
  Administered 2021-05-08 (×2): 100 via INTRAVENOUS

## 2021-05-08 NOTE — H&P (Signed)
 Endoscopy Physician Short Form      Scheduled Procedure: Colonoscopy    Diagnosis:   Encounter for screening for malignant neoplasm of colon    ASA Class: See anesthesia record  Mallampati: See anesthesia record    Allergy:  No Known Allergies     Home Medications: Reviewed  Patient's Medications   New Prescriptions    No medications on file   Previous Medications    CHOLECALCIFEROL, VITAMIN D3, (VITAMIN D-3) 25 MCG (1000 UT) TABLET    Take one tab by mouth daily for bones/low vitamin D    MULTIVITAMIN CAPSULE    one by mouth daily for health   Modified Medications    No medications on file   Discontinued Medications    No medications on file        Medical History:   Past Medical History:   Diagnosis Date   . Diverticulosis    . Murmur    . Pyloric stenosis     childhood   . Skin cancer    . Testicular cancer (CMS/HCC)         Physical Exam:   GEN: In no acute distress, alert and oriented     EYES: No conjunctival pallor, no scleral icterus     CV: No abnormalities noted. No LE edema     PULM: No abnormalities noted; No respiratory distress     ABD: No abnormalities noted. Soft, nontender and non-distended.   No guarding, rebound or rigidity.     PSYCH: Mood and affect are appropriate     Plan:   Colonoscopy      Mendel Corning, MD

## 2021-05-08 NOTE — Anesthesia Pre-Procedure Evaluation (Signed)
 Patient: Bobby Beltran    Procedure Information     Date/Time: 05/08/21 1130    Scheduled providers: Josiah Lobo, MD; Rico Junker, MD    Procedure: COLONOSCOPY    Location: [facility]          Relevant Problems   Anesthesia   (+) Hx of testicular cancer       Clinical information reviewed:    Allergies  Meds                Physical Exam    Airway  Mallampati: I  TM distance: >3 FB  Mouth opening: >3 FB  Neck ROM: full  Able to protrude mandible  No external dysmorphic features noted       Cardiovascular   Rhythm: regular  Rate: normal  Functional capacity: greater than or equal to 4 METS without symptoms   Dental - normal exam     Pulmonary - normal exam     Abdominal - normal exam     General   Alert                 Anesthesia Plan    ASA 2   NPO status verified    MAC     (Anesthetic plan is discussed with the patient. Nature of MAC anesthesia, including possible awareness during the procedure, post op nausea and vomiting, explained. Patient undestands and wishes to proceed.)Airway: natural airway  Monitoring: standard monitors    Essential imaging and labs available and reviewed    Anesthetic plan and risks discussed with patient.  Preprocedure Evaluation: No items outstanding.    Complex patient: No

## 2021-05-08 NOTE — Anesthesia Post-Procedure Evaluation (Signed)
 Patient: Bobby Beltran    Procedure Summary     Date: 05/08/21 Room / Location: [facility]    Anesthesia Start: 1137 Anesthesia Stop: 1202    Procedure: COLONOSCOPY Diagnosis:       Encounter for screening for malignant neoplasm of colon      (Screening for colorectal malignant neoplasm)    Scheduled Providers: Josiah Lobo, MD; Rico Junker, MD Responsible Provider: Rico Junker, MD    Anesthesia Type: MAC ASA Status: 2          Anesthesia Type: MAC    Vitals Value Taken Time   BP 117/72 05/08/21 1201   Temp  05/08/21 1202   Pulse 71 05/08/21 1201   Resp 12 05/08/21 1201   SpO2 99 % 05/08/21 1201       Anesthesia Post Evaluation Note:    Patient location during evaluation: Endo Recovery  Patient participation: able to participate  Level of consciousness: awake  Cardiovascular and Hydration status: vital signs within acceptable range  Respiratory Status Stable and Airway Patent: yes  Nausea and Vomiting Control Satisfactory: yes  Pain score: 0  Pain management: adequate     Aldrete score reviewed: yes  Vitals reviewed: yes  Unplanned ICU Admission: noPatient has recovered from anesthesia and has returned to baseline mental status, cardiovascular and respiratory function. Pain, nausea, and vomiting are adequately controlled and the patient is adequately hydrated and appropriate for discharge from PACU?: yes      There were no known notable events for this encounter.

## 2021-05-09 ENCOUNTER — Other Ambulatory Visit: Payer: Self-pay

## 2021-05-09 LAB — TISSUE PATHOLOGY

## 2021-05-10 ENCOUNTER — Ambulatory Visit (INDEPENDENT_AMBULATORY_CARE_PROVIDER_SITE_OTHER): Payer: Medicaid Other | Admitting: Internal Medicine

## 2021-05-10 DIAGNOSIS — I5020 Unspecified systolic (congestive) heart failure: Secondary | ICD-10-CM | POA: Diagnosis not present

## 2021-05-10 DIAGNOSIS — R569 Unspecified convulsions: Secondary | ICD-10-CM | POA: Diagnosis present

## 2021-05-10 DIAGNOSIS — I110 Hypertensive heart disease with heart failure: Secondary | ICD-10-CM

## 2021-05-10 DIAGNOSIS — I10 Essential (primary) hypertension: Secondary | ICD-10-CM

## 2021-05-10 LAB — TISSUE PATHOLOGY

## 2021-05-10 NOTE — Patient Instructions (Signed)
Continue to take your medications as prescribed   Please make an appointment with neurology so that they can take over and continue prescribing you the seizure medications and monitor his levels  Please call our office to schedule an in person visit for labs and vitals   It was so nice talking to you today, we look forward to seeing you soon!

## 2021-05-10 NOTE — Assessment & Plan Note (Addendum)
Pt last seen in office 6 mo ago. Pt has a hx of seizures, and was admitted to the IMTS service from 02/25/21 to 03/11/21 for non-convulsive status epilepticus and acute metabolic encephalopathy. Presentation in s/o stress/anxiety after the loss of a family member. Does have remote Hx of ETOH use, but per chart review he has not had a drink in months at that time. CT head negative, EEG showing cortical dysfunction from L posterior quadrant. He was loaded with keppra and fosphenyotion, requiring titration of several AEDs including keppra, vimpat, phenytoin, perampanel and prn ativan by neurology. Overnight EEG consistent with focal non-convulsive status epilepticusarising from left,andmoderate diffuse encephalopathy nonspecific etiology but likely related toseizure. LTM EEG done for 3 days until event free, followed by brain MRI, which did not show any acute changes that could explain his ongoing expressive aphasia during time of discharge. Ongoing expressive aphasia was attributed to combination of sedative agents and post ictal state. Pt remained seizure free for 2-3 days, so neurology startedphenytoinand parampanel taper, with plan to receive speech therapy at [facility]. Pt was advised to follow up with neurology in outpatient setting. Final neurology recommendations at that time were to continue Keppra 1500 mg bid and Vimpat 200 mg bid. At [facility] d/c, pt had improvement in activity tolerance, balance, postural control as well as ability to compensate for deficits, including speech on 10/28.  Pt reports doing well since d/c; denies seizure-like activity, headaches, and confusion. Wife reports that pt is doing well overall.  He was requesting AED refills and was advised to f/u in clinic since he has not been seen in clinic for 6 mo or since hospitalization. He has not followed with neurology in the outpatient setting. Considering very challenging case to manage by neurology while in hospital, this pt needs close  neurology f/u; this was emphasized and the pt expresses understanding.   Vimpat and Keppra refilled until pt able to see neurology  Follow up with Neurology outpatient/ Dr Marjory Lies for continued AED refills  F/u for in-person visit in clinic

## 2021-05-10 NOTE — Assessment & Plan Note (Addendum)
02/2021 Echo 40-45% with grade 2 DD. Euvolemic on exam during Cards visit on 12/2 with stable vitals. Due to new onset LV dysfunction, pt to get a nuclear stress test on 12/21 and to continue following up with cardiology.   Per cardiology, continue hydralazine, Isordil, lisinopril, and metoprolol for LV dysfunction.

## 2021-05-10 NOTE — Assessment & Plan Note (Addendum)
Reports compliance to amlodipine 10, clonidine 0.1 mg bid, lopressor 50 bid, lisinopril 20 mg. 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 Pt advised to f/u in clinic soon for check up. Denies headaches, SHOB, and CP.

## 2021-05-10 NOTE — Progress Notes (Signed)
° °  CC: medication refill for seizures, HLD, HTN, and DM  This is a telephone encounter between [employer] and [employer] on 05/10/2021 for stated above. The visit was conducted with the patient located at home and [facility] at [facility]. The patient's identity was confirmed using their DOB and current address. The patient has consented to being evaluated through a telephone encounter and understands the associated risks (an examination cannot be done and the patient may need to come in for an appointment) / benefits (allows the patient to remain at home, decreasing exposure to coronavirus). I personally spent 20 minutes on medical discussion.   HPI:  [name] is a 60 y.o. with PMH as below.   Please see A&P for assessment of the patient's acute and chronic medical conditions.   Past Medical History:  Diagnosis Date   DM2 (diabetes mellitus, type 2) (HCC)    ETOH abuse    Hypertension    Seizures (HCC)    Review of Systems:  ROS negative except for what is noted on the assessment and plan    Assessment & Plan:   See Encounters Tab for problem based charting.  Patient discussed with Dr. Delrae Alfred, MD Internal Medicine Resident, PGY-1 Internal Medicine Clinic

## 2021-05-11 ENCOUNTER — Encounter (INDEPENDENT_AMBULATORY_CARE_PROVIDER_SITE_OTHER): Admitting: Internal Medicine

## 2021-05-11 NOTE — Progress Notes (Signed)
Internal Medicine Clinic Attending  Case discussed with Dr. Patel  At the time of the visit.  We reviewed the resident's history and exam and pertinent patient test results.  I agree with the assessment, diagnosis, and plan of care documented in the resident's note.  

## 2021-05-11 NOTE — Progress Notes (Signed)
Mailed

## 2021-05-16 ENCOUNTER — Encounter (INDEPENDENT_AMBULATORY_CARE_PROVIDER_SITE_OTHER): Admitting: Family Medicine

## 2021-05-17 ENCOUNTER — Ambulatory Visit: Payer: Medicaid Other

## 2021-05-17 ENCOUNTER — Other Ambulatory Visit: Payer: Self-pay

## 2021-05-17 ENCOUNTER — Ambulatory Visit: Payer: Medicaid Other | Admitting: Licensed Clinical Social Worker

## 2021-05-17 DIAGNOSIS — I119 Hypertensive heart disease without heart failure: Secondary | ICD-10-CM

## 2021-05-17 DIAGNOSIS — I519 Heart disease, unspecified: Secondary | ICD-10-CM | POA: Diagnosis not present

## 2021-05-17 DIAGNOSIS — I10 Essential (primary) hypertension: Secondary | ICD-10-CM | POA: Diagnosis not present

## 2021-05-17 NOTE — Patient Instructions (Signed)
Visit Information  Instructions: patient will work with SW to address concerns related to level of care concerns.  Patient was given the following information about care management and care coordination services today, agreed to services, and gave verbal consent: 1.care management/care coordination services include personalized support from designated clinical staff supervised by their physician, including individualized plan of care and coordination with other care providers 2. 24/7 contact phone numbers for assistance for urgent and routine care needs. 3. The patient may stop care management/care coordination services at any time by phone call to the office staff.  Patient verbalizes understanding of instructions provided today and agrees to view in MyChart.   The care management team will reach out to the patient again over the next 30 days.   Arthur [name], BSW  Social Worker [facility] Care Management  [phone]      

## 2021-05-17 NOTE — Chronic Care Management (AMB) (Signed)
°  Care Management   Social Work Visit Note  05/17/2021 Name: Arthur [name] MRN: [id number] DOB: August 15, 1960  Arthur [name] is a 60 y.o. year old male who sees [name], MD for primary care. The care management team was consulted for assistance with care management and care coordination needs related to [facility] Resources    Patient was given the following information about care management and care coordination services today, agreed to services, and gave verbal consent: 1.care management/care coordination services include personalized support from designated clinical staff supervised by their physician, including individualized plan of care and coordination with other care providers 2. 24/7 contact phone numbers for assistance for urgent and routine care needs. 3. The patient may stop care management/care coordination services at any time by phone call to the office staff.  Engaged with patient by telephone for follow up visit in response to provider referral for social work chronic care management and care coordination services.  Assessment: Review of patient history, allergies, and health status during evaluation of patient need for care management/care coordination services.    Interventions:  Patient interviewed and appropriate assessments performed Collaborated with clinical team regarding patient needs  SW spoke with patients spouse-[name],Arthur on today. Mrs. Arthur [name] reported Arthur [name] is doing well. They are awaiting his speech therapy to start. Arthur [name] stated medicaid is waiting on medical records. Mrs. Arthur [name] will request documents to be sent to DSS.  No other concerns or questions were discussed.     Plan:  patient will work with BSW to address needs related to Level of care concerns Social Worker will follow up within 30 days.   Christen Butter, BSW  Social Worker [facility] Care Management  [phone]

## 2021-05-23 ENCOUNTER — Telehealth: Payer: Self-pay | Admitting: Student

## 2021-05-23 NOTE — Telephone Encounter (Signed)
Wife paged on-call provider for concern for elevated blood pressure.  States patient was sitting in a chair felt dizzy for a few seconds which then improved.  She then took his blood pressure and noted his blood pressure was 160/100.  She is not sure how long he had been sitting since she took the blood pressure.  Does note that patient has been taking all of his blood pressure medications and has not missed any doses.  Patient reports he has some tooth pain but otherwise feels back to his baseline.  Wife is concerned given his recent stress test and recent seizure.  Wife notes he has not had any seizure-like activity with staring or blinking or difficulty with maintaining attention/confusion.  Is been taking his seizure medicines has not no any acute stressors.  Patient denies any weakness loss of sensation, fevers, headaches, or visual changes.  Within his dizziness rapidly resolved do not think this is related to his hypertension.  Discussed that we would not advise he take more of his home blood pressure medicines at this time.  Advised that she can monitor patient's blood pressure periodically after he has been sitting in a quiet place with legs uncrossed and on the ground for 5 minutes.  Otherwise if his dizziness returns and is bothersome or blood pressure is consistently elevated would recommend clinic follow-up for this.  Discussed that patient will need to be taken to the ED if he does develop symptoms of seizures similar to his prior admission in October.  Wife understands and is agreeable to plan.[name]

## 2021-05-24 ENCOUNTER — Other Ambulatory Visit: Payer: Medicaid Other

## 2021-05-30 NOTE — Progress Notes (Deleted)
Primary Physician/Referring:  [name], MD  Patient ID: Arthur [name], male    DOB: Jan 24, 1961, 61 y.o.   MRN: [id number]  No chief complaint on file.  HPI:    Arthur [name]  is a 61 y.o. AA male with history of seizure disorder, hypertension, prediabetes, alcohol abuse, tobacco use.    Patient was admitted 03/12/2019 - 03/16/2021 with acute encephalopathy.  Cardiology was consulted for management of hypertension at that time.  Echocardiogram during hospitalization suggested underlying cardiomyopathy without heart failure may be related to alcohol abuse versus uncontrolled hypertension.  Noted global hypokinesis with mild inferior wall motion abnormality, recommended outpatient stress testing.  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  Patient was discharged on amlodipine, hydralazine, Isordil, lisinopril, and clonidine patch. He now presents for follow up.   Patient now has home health nurse comes to the house 5 days/week.  Reports home blood pressure readings averaging 130/90 mmHg with heart rate averaging 90 bpm.  Patient has refrain from alcohol use.  Notably patient took antihypertensive medications just prior to today's office visit, which may be contributing to elevated blood pressure at this time.  Patient is otherwise feeling well.  Denies chest pain, palpitations, syncope, near syncope, dyspnea, leg edema, orthopnea.  Patient's home health nurse is present at bedside and his wife is on the phone, both contribute to history as well.  Past Medical History:  Diagnosis Date   DM2 (diabetes mellitus, type 2) ([place])    ETOH abuse    Hypertension    Seizures ([place])    Past Surgical History:  Procedure Laterality Date   HEMORRHOID SURGERY     Family History  Problem Relation Age of Onset   Diabetes Mother    Hypertension Mother    Diabetes Father    Hypertension Father    Hypertension Sister     Social History   Tobacco Use   Smoking  status: Every Day    Types: Cigars   Smokeless tobacco: Never   Tobacco comments:    Smokes black& mild - 1 a day.  Substance Use Topics   Alcohol use: Not Currently   Marital Status: Married   ROS  Review of Systems  Constitutional: Negative for malaise/fatigue and weight gain.  Cardiovascular:  Negative for chest pain, claudication, leg swelling, near-syncope, orthopnea, palpitations, paroxysmal nocturnal dyspnea and syncope.  Respiratory:  Negative for shortness of breath.   Neurological:  Negative for dizziness.   Objective  There were no vitals taken for this visit.  Vitals with BMI 04/28/2021 04/28/2021 03/24/2021  Height - 5\' 3"  -  Weight - 155 lbs -  BMI - 0000000 -  Systolic [id number]  Diastolic 91 94 98  Pulse [id number] 100 79      Physical Exam Vitals reviewed.  HENT:     Head: Normocephalic and atraumatic.  Cardiovascular:     Rate and Rhythm: Normal rate and regular rhythm.     Pulses: Intact distal pulses.     Heart sounds: S1 normal and S2 normal. No murmur heard.   No gallop.  Pulmonary:     Effort: Pulmonary effort is normal. No respiratory distress.     Breath sounds: No wheezing, rhonchi or rales.  Musculoskeletal:     Right lower leg: No edema.     Left lower leg: No edema.  Neurological:     Mental Status: He is alert.    Laboratory examination:   Recent Labs    03/20/21 0540  03/23/21 0521 03/24/21 0505  NA 137 137 132*  K 4.0 4.0 3.7  CL 103 106 101  CO2 26 25 24   GLUCOSE 100* 99 106*  BUN 11 19 20   CREATININE 1.04 1.49* 1.24  CALCIUM 9.2 8.9 8.8*  GFRNONAA >60 54* >60    CrCl cannot be calculated (Patient's most recent lab result is older than the maximum 21 days allowed.).  CMP Latest Ref Rng & Units 03/24/2021 03/23/2021 03/20/2021  Glucose 70 - 99 mg/dL 106(H) 99 100(H)  BUN 6 - 20 mg/dL 20 19 11   Creatinine 0.61 - 1.24 mg/dL 1.24 1.49(H) 1.04  Sodium 135 - 145 mmol/L 132(L) 137 137  Potassium 3.5 - 5.1 mmol/L 3.7 4.0 4.0   Chloride 98 - 111 mmol/L 101 106 103  CO2 22 - 32 mmol/L 24 25 26   Calcium 8.9 - 10.3 mg/dL 8.8(L) 8.9 9.2  Total Protein 6.5 - 8.1 g/dL - - -  Total Bilirubin 0.3 - 1.2 mg/dL - - -  Alkaline Phos 38 - 126 U/L - - -  AST 15 - 41 U/L - - -  ALT 0 - 44 U/L - - -   CBC Latest Ref Rng & Units 03/20/2021 03/13/2021 02/27/2021  WBC 4.0 - 10.5 K/uL 6.3 5.4 7.9  Hemoglobin 13.0 - 17.0 g/dL 13.2 13.0 14.4  Hematocrit 39.0 - 52.0 % 40.9 39.1 42.7  Platelets 150 - 400 K/uL 380 349 233    Lipid Panel Recent Labs    10/17/20 0151 10/18/20 0735 03/23/21 0521  CHOL 217*  --  147  TRIG 480* 485* 127  LDLCALC UNABLE TO CALCULATE IF TRIGLYCERIDE OVER 400 mg/dL  --  86  VLDL UNABLE TO CALCULATE IF TRIGLYCERIDE OVER 400 mg/dL  --  25  HDL <10*  --  36*  CHOLHDL NOT CALCULATED  --  4.1  LDLDIRECT 124.3*  --   --      HEMOGLOBIN A1C Lab Results  Component Value Date   HGBA1C 6.0 (H) 02/27/2021   MPG 125.5 02/27/2021   TSH Recent Labs    02/27/21 0244  TSH 1.377     External labs:  None   Allergies   Allergies  Allergen Reactions   Penicillins Hives    Did it involve swelling of the face/tongue/throat, SOB, or low BP? Y Did it involve sudden or severe rash/hives, skin peeling, or any reaction on the inside of your mouth or nose? N Did you need to seek medical attention at a hospital or doctor's office? Y When did it last happen?  Over 5 Years Ago     If all above answers are NO, may proceed with cephalosporin use.     Medications Prior to Visit:   Outpatient Medications Prior to Visit  Medication Sig Dispense Refill   acetaminophen (TYLENOL) 325 MG tablet Take 2 tablets (650 mg total) by mouth every 6 (six) hours as needed for mild pain (or Fever >/= 101).     amLODipine (NORVASC) 10 MG tablet Take 1 tablet (10 mg total) by mouth daily. 90 tablet 3   aspirin EC 81 MG tablet Take 81 mg by mouth daily. Swallow whole. (Patient not taking: Reported on 02/24/2021)      clonazePAM (KLONOPIN) 1 MG tablet Take 1 tablet (1 mg total) by mouth 2 (two) times daily. 60 tablet 0   cloNIDine (CATAPRES - DOSED IN MG/24 HR) 0.2 mg/24hr patch Place 1 patch (0.2 mg total) onto the skin once a week. 4 patch  12   hydrALAZINE (APRESOLINE) 50 MG tablet Take 1 tablet (50 mg total) by mouth every 8 (eight) hours. 270 tablet 3   HYDROcodone-acetaminophen (NORCO/VICODIN) 5-325 MG tablet Take 1 tablet by mouth every 4 (four) hours as needed for moderate pain. (Patient not taking: Reported on 04/28/2021) 30 tablet 0   isosorbide dinitrate (ISORDIL) 30 MG tablet Take 1 tablet (30 mg total) by mouth 3 (three) times daily. 270 tablet 3   lacosamide (VIMPAT) 200 MG TABS tablet Take 1 tablet (200 mg total) by mouth 2 (two) times daily. 120 tablet 2   levETIRAcetam (KEPPRA) 750 MG tablet Take 2 tablets (1,500 mg total) by mouth 2 (two) times daily. 120 tablet 3   lisinopril (ZESTRIL) 20 MG tablet Take 1 tablet (20 mg total) by mouth 2 (two) times daily. 180 tablet 3   metFORMIN (GLUCOPHAGE) 500 MG tablet Take 1 tablet (500 mg total) by mouth 2 (two) times daily with a meal. IM PROGRAM 60 tablet 0   methocarbamol (ROBAXIN) 500 MG tablet Take 1 tablet (500 mg total) by mouth every 6 (six) hours as needed for muscle spasms. (Patient not taking: Reported on 04/28/2021) 60 tablet 0   metoprolol tartrate (LOPRESSOR) 50 MG tablet Take 1 tablet (50 mg total) by mouth 2 (two) times daily. 180 tablet 3   Multiple Vitamin (MULTIVITAMIN WITH MINERALS) TABS tablet Take 1 tablet by mouth daily. (Patient not taking: No sig reported)     perampanel (FYCOMPA) 2 MG tablet Take 1 tablet (2 mg total) by mouth at bedtime. (Patient not taking: Reported on 04/28/2021) 3 tablet 0   phenytoin (DILANTIN) 50 MG tablet Chew 1 tablet (50 mg total) by mouth daily. (Patient not taking: Reported on 04/28/2021) 3 tablet 0   senna (SENOKOT) 8.6 MG TABS tablet Take 2 tablets (17.2 mg total) by mouth daily. (Patient not taking:  Reported on 04/28/2021) 120 tablet 0   No facility-administered medications prior to visit.   Final Medications at End of Visit    No outpatient medications have been marked as taking for the 05/31/21 encounter (Appointment) with Rayetta Pigg, Myanna Ziesmer C, PA-C.   Radiology:   No results found.  Cardiac Studies:   Echocardiogram 03/22/2021: 1. There is global hypokinesis and inferior and inferoseptal hypokinesis.  . Left ventricular ejection fraction, by estimation, is 40 to 45%. Left  ventricular ejection fraction by 2D MOD biplane is 43.7 %. The left  ventricle has mildly decreased  function. The left ventricle demonstrates regional wall motion  abnormalities (see scoring diagram/findings for description). There is  moderate concentric left ventricular hypertrophy. Left ventricular  diastolic parameters are consistent with Grade II  diastolic dysfunction (pseudonormalization). Elevated left ventricular  end-diastolic pressure. There is mild hypokinesis of the left ventricular,  entire inferoseptal wall and inferior segment.   2. Right ventricular systolic function is normal. The right ventricular  size is normal.   3. The mitral valve is normal in structure. No evidence of mitral valve  regurgitation. No evidence of mitral stenosis.   4. The aortic valve is normal in structure. Aortic valve regurgitation is  not visualized. No aortic stenosis is present.   5. The inferior vena cava is normal in size with greater than 50%  respiratory variability, suggesting right atrial pressure of 3 mmHg.   EKG:   02/24/2021: Normal sinus rhythm at rate of 97 bpm, biatrial enlargement, baseline artifact, nonspecific T abnormality..  Assessment   No diagnosis found.    There are no discontinued medications.  No orders of the defined types were placed in this encounter.   Recommendations:   Arthur [name] is a 61 y.o. AA male with history of seizure disorder, hypertension, prediabetes,  alcohol abuse, tobacco use.    Patient was admitted 03/12/2019 - 03/16/2021 with acute encephalopathy.  Cardiology was consulted for management of hypertension at that time.  Echocardiogram during hospitalization suggested underlying cardiomyopathy without heart failure may be related to alcohol abuse versus uncontrolled hypertension.  Noted global hypokinesis with mild inferior wall motion abnormality, recommended outpatient stress testing.  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  Patient was discharged on amlodipine, hydralazine, Isordil, lisinopril, and clonidine patch. He now presents for follow up.   Is feeling well since discharge from the hospital.  There is no clinical evidence of acute decompensated heart failure.  He remains relatively asymptomatic.  Patient's blood pressure is mildly elevated at today's office visit, however suspect this is due to the patient has only just taken his antihypertensive medications before today's visit.  Home blood pressure readings are relatively well controlled.  Given LV dysfunction and elevated heart rate and blood pressure will initiate Lopressor 50 mg p.o. twice daily.  Patient's home health agency will continue to monitor heart rate and blood pressure closely.  Counseled patient and his wife and home health nurse regarding signs and symptoms that would warrant urgent or emergent evaluation.  Due to new onset LV dysfunction recommend nuclear stress test.  Patient is not a candidate for the treadmill given gait imbalance.  Suspect patient's cardiomyopathy is related to history of alcohol abuse or uncontrolled hypertension, however ischemic heart disease cannot be ruled out will therefore proceed with stress test.  In regards to LV dysfunction continue hydralazine, Isordil, lisinopril, and add metoprolol.  Follow-up in 6 weeks, sooner if needed, for results of stress test and follow-up of LV dysfunction and  hypertension.   Alethia Berthold, PA-C 05/30/2021, 3:35 PM Office: [phone]

## 2021-05-31 ENCOUNTER — Ambulatory Visit: Payer: Medicaid Other | Admitting: Student

## 2021-05-31 ENCOUNTER — Encounter: Payer: Self-pay | Admitting: Student

## 2021-05-31 ENCOUNTER — Other Ambulatory Visit: Payer: Self-pay

## 2021-05-31 VITALS — BP 120/73 | HR 84 | Temp 98.5°F | Ht 63.0 in | Wt 157.6 lb

## 2021-05-31 DIAGNOSIS — R9439 Abnormal result of other cardiovascular function study: Secondary | ICD-10-CM

## 2021-05-31 DIAGNOSIS — I119 Hypertensive heart disease without heart failure: Secondary | ICD-10-CM

## 2021-05-31 DIAGNOSIS — I519 Heart disease, unspecified: Secondary | ICD-10-CM

## 2021-05-31 MED ORDER — ATORVASTATIN CALCIUM 40 MG PO TABS
40.0000 mg | ORAL_TABLET | Freq: Every day | ORAL | 3 refills | Status: DC
  Filled 2021-05-31 – 2021-06-05 (×2): qty 90, 90d supply, fill #0
  Filled 2021-08-22: qty 90, 90d supply, fill #1
  Filled 2021-11-27: qty 90, 90d supply, fill #2
  Filled 2021-11-27 (×2): qty 90, 90d supply, fill #0

## 2021-05-31 NOTE — Progress Notes (Signed)
Primary Physician/Referring:  [name], MD  Patient ID: [name], male    DOB: 20-Apr-1961, 61 y.o.   MRN: [id number]  Chief Complaint  Patient presents with   Hypertension   Follow-up   HPI:    LADAMIEN RAMMEL  is a 61 y.o. AA male with history of seizure disorder, hypertension, prediabetes, alcohol abuse, tobacco use.    Patient was admitted 03/12/2019 - 03/16/2021 with acute encephalopathy.  Cardiology was consulted for management of hypertension at that time.  Echocardiogram during hospitalization suggested underlying cardiomyopathy without heart failure may be related to alcohol abuse versus uncontrolled hypertension.  Noted global hypokinesis with mild inferior wall motion abnormality, recommended outpatient stress testing.  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  Patient was discharged on amlodipine, hydralazine, Isordil, lisinopril, and clonidine  patch.   Patient followed up in our office 04/28/2021 at which time started Lopressor 50 mg p.o. twice daily.  Since last office visit patient underwent nuclear stress test which showed small moderate intensity fixed perfusion defect suggestive of prior inferior infarct, patient also had hypertensive response at rest. He now presents for 1 month follow up accompanied by a home health aide.  Patient reports he is feeling well overall without specific complaints. Denies chest pain, palpitations, syncope, near syncope, dyspnea, leg edema, orthopnea.  Patient's home health nurse is present at bedside and his wife is on the phone, both contribute to history as well.   Past Medical History:  Diagnosis Date   DM2 (diabetes mellitus, type 2) ([place])    ETOH abuse    Hypertension    Seizures ([place])    Past Surgical History:  Procedure Laterality Date   HEMORRHOID SURGERY     Family History  Problem Relation Age of Onset   Diabetes Mother    Hypertension Mother    Diabetes Father    Hypertension  Father    Hypertension Sister     Social History   Tobacco Use   Smoking status: Every Day    Types: Cigars   Smokeless tobacco: Never   Tobacco comments:    Smokes black& mild - 1 a day.  Substance Use Topics   Alcohol use: Not Currently   Marital Status: Married   ROS  Review of Systems  Constitutional: Negative for malaise/fatigue and weight gain.  Cardiovascular:  Negative for chest pain, claudication, leg swelling, near-syncope, orthopnea, palpitations, paroxysmal nocturnal dyspnea and syncope.  Respiratory:  Negative for shortness of breath.   Neurological:  Negative for dizziness.   Objective  Blood pressure 120/73, pulse 84, temperature 98.5 F (36.9 C), temperature source Temporal, height $RemoveBeforeDE'5\' 3"'zGAxpqtnWXKLsnC$  (1.6 m), weight 157 lb 9.6 oz (71.5 kg), SpO2 96 %.  Vitals with BMI 05/31/2021 04/28/2021 04/28/2021  Height $Remov'5\' 3"'PXSRKq$  - $'5\' 3"'R$   Weight 157 lbs 10 oz - 155 lbs  BMI 14.97 - 02.63  Systolic 785 885 027  Diastolic 73 91 94  Pulse 84 103 100      Physical Exam Vitals reviewed.  Cardiovascular:     Rate and Rhythm: Normal rate and regular rhythm.     Pulses: Intact distal pulses.     Heart sounds: S1 normal and S2 normal. No murmur heard.   No gallop.  Pulmonary:     Effort: Pulmonary effort is normal. No respiratory distress.     Breath sounds: No wheezing, rhonchi or rales.  Musculoskeletal:     Right lower leg: No edema.     Left lower leg: No  edema.  Neurological:     Mental Status: He is alert.    Laboratory examination:   Recent Labs    03/20/21 0540 03/23/21 0521 03/24/21 0505  NA 137 137 132*  K 4.0 4.0 3.7  CL 103 106 101  CO2 $Re'26 25 24  'FVI$ GLUCOSE 100* 99 106*  BUN $Re'11 19 20  'Syg$ CREATININE 1.04 1.49* 1.24  CALCIUM 9.2 8.9 8.8*  GFRNONAA >60 54* >60   CrCl cannot be calculated (Patient's most recent lab result is older than the maximum 21 days allowed.).  CMP Latest Ref Rng & Units 03/24/2021 03/23/2021 03/20/2021  Glucose 70 - 99 mg/dL 106(H) 99 100(H)   BUN 6 - 20 mg/dL $Remove'20 19 11  'FQAsADn$ Creatinine 0.61 - 1.24 mg/dL 1.24 1.49(H) 1.04  Sodium 135 - 145 mmol/L 132(L) 137 137  Potassium 3.5 - 5.1 mmol/L 3.7 4.0 4.0  Chloride 98 - 111 mmol/L 101 106 103  CO2 22 - 32 mmol/L $RemoveB'24 25 26  'iphTVEmr$ Calcium 8.9 - 10.3 mg/dL 8.8(L) 8.9 9.2  Total Protein 6.5 - 8.1 g/dL - - -  Total Bilirubin 0.3 - 1.2 mg/dL - - -  Alkaline Phos 38 - 126 U/L - - -  AST 15 - 41 U/L - - -  ALT 0 - 44 U/L - - -   CBC Latest Ref Rng & Units 03/20/2021 03/13/2021 02/27/2021  WBC 4.0 - 10.5 K/uL 6.3 5.4 7.9  Hemoglobin 13.0 - 17.0 g/dL 13.2 13.0 14.4  Hematocrit 39.0 - 52.0 % 40.9 39.1 42.7  Platelets 150 - 400 K/uL 380 349 233    Lipid Panel Recent Labs    10/17/20 0151 10/18/20 0735 03/23/21 0521  CHOL 217*  --  147  TRIG 480* 485* 127  LDLCALC UNABLE TO CALCULATE IF TRIGLYCERIDE OVER 400 mg/dL  --  86  VLDL UNABLE TO CALCULATE IF TRIGLYCERIDE OVER 400 mg/dL  --  25  HDL <10*  --  36*  CHOLHDL NOT CALCULATED  --  4.1  LDLDIRECT 124.3*  --   --     HEMOGLOBIN A1C Lab Results  Component Value Date   HGBA1C 6.0 (H) 02/27/2021   MPG 125.5 02/27/2021   TSH Recent Labs    02/27/21 0244  TSH 1.377    External labs:  None   Allergies   Allergies  Allergen Reactions   Penicillins Hives    Did it involve swelling of the face/tongue/throat, SOB, or low BP? Y Did it involve sudden or severe rash/hives, skin peeling, or any reaction on the inside of your mouth or nose? N Did you need to seek medical attention at a hospital or doctor's office? Y When did it last happen?  Over 5 Years Ago     If all above answers are NO, may proceed with cephalosporin use.     Medications Prior to Visit:   Outpatient Medications Prior to Visit  Medication Sig Dispense Refill   acetaminophen (TYLENOL) 325 MG tablet Take 2 tablets (650 mg total) by mouth every 6 (six) hours as needed for mild pain (or Fever >/= 101).     amLODipine (NORVASC) 10 MG tablet Take 1 tablet (10 mg  total) by mouth daily. 90 tablet 3   aspirin EC 81 MG tablet Take 81 mg by mouth daily. Swallow whole.     clonazePAM (KLONOPIN) 1 MG tablet Take 1 tablet (1 mg total) by mouth 2 (two) times daily. 60 tablet 0   cloNIDine (CATAPRES - DOSED IN MG/24 HR)  0.2 mg/24hr patch Place 1 patch (0.2 mg total) onto the skin once a week. 4 patch 12   hydrALAZINE (APRESOLINE) 50 MG tablet Take 1 tablet (50 mg total) by mouth every 8 (eight) hours. 270 tablet 3   HYDROcodone-acetaminophen (NORCO/VICODIN) 5-325 MG tablet Take 1 tablet by mouth every 4 (four) hours as needed for moderate pain. 30 tablet 0   isosorbide dinitrate (ISORDIL) 30 MG tablet Take 1 tablet (30 mg total) by mouth 3 (three) times daily. 270 tablet 3   lacosamide (VIMPAT) 200 MG TABS tablet Take 1 tablet (200 mg total) by mouth 2 (two) times daily. 120 tablet 2   levETIRAcetam (KEPPRA) 750 MG tablet Take 2 tablets (1,500 mg total) by mouth 2 (two) times daily. 120 tablet 3   lisinopril (ZESTRIL) 20 MG tablet Take 1 tablet (20 mg total) by mouth 2 (two) times daily. 180 tablet 3   metFORMIN (GLUCOPHAGE) 500 MG tablet Take 1 tablet (500 mg total) by mouth 2 (two) times daily with a meal. IM PROGRAM 60 tablet 0   methocarbamol (ROBAXIN) 500 MG tablet Take 1 tablet (500 mg total) by mouth every 6 (six) hours as needed for muscle spasms. 60 tablet 0   metoprolol tartrate (LOPRESSOR) 50 MG tablet Take 1 tablet (50 mg total) by mouth 2 (two) times daily. 180 tablet 3   Multiple Vitamin (MULTIVITAMIN WITH MINERALS) TABS tablet Take 1 tablet by mouth daily.     perampanel (FYCOMPA) 2 MG tablet Take 1 tablet (2 mg total) by mouth at bedtime. 3 tablet 0   phenytoin (DILANTIN) 50 MG tablet Chew 1 tablet (50 mg total) by mouth daily. 3 tablet 0   senna (SENOKOT) 8.6 MG TABS tablet Take 2 tablets (17.2 mg total) by mouth daily. 120 tablet 0   No facility-administered medications prior to visit.   Final Medications at End of Visit    Current Meds   Medication Sig   acetaminophen (TYLENOL) 325 MG tablet Take 2 tablets (650 mg total) by mouth every 6 (six) hours as needed for mild pain (or Fever >/= 101).   amLODipine (NORVASC) 10 MG tablet Take 1 tablet (10 mg total) by mouth daily.   aspirin EC 81 MG tablet Take 81 mg by mouth daily. Swallow whole.   atorvastatin (LIPITOR) 40 MG tablet Take 1 tablet (40 mg total) by mouth daily.   clonazePAM (KLONOPIN) 1 MG tablet Take 1 tablet (1 mg total) by mouth 2 (two) times daily.   cloNIDine (CATAPRES - DOSED IN MG/24 HR) 0.2 mg/24hr patch Place 1 patch (0.2 mg total) onto the skin once a week.   hydrALAZINE (APRESOLINE) 50 MG tablet Take 1 tablet (50 mg total) by mouth every 8 (eight) hours.   HYDROcodone-acetaminophen (NORCO/VICODIN) 5-325 MG tablet Take 1 tablet by mouth every 4 (four) hours as needed for moderate pain.   isosorbide dinitrate (ISORDIL) 30 MG tablet Take 1 tablet (30 mg total) by mouth 3 (three) times daily.   lacosamide (VIMPAT) 200 MG TABS tablet Take 1 tablet (200 mg total) by mouth 2 (two) times daily.   levETIRAcetam (KEPPRA) 750 MG tablet Take 2 tablets (1,500 mg total) by mouth 2 (two) times daily.   lisinopril (ZESTRIL) 20 MG tablet Take 1 tablet (20 mg total) by mouth 2 (two) times daily.   metFORMIN (GLUCOPHAGE) 500 MG tablet Take 1 tablet (500 mg total) by mouth 2 (two) times daily with a meal. IM PROGRAM   methocarbamol (ROBAXIN) 500 MG tablet Take  1 tablet (500 mg total) by mouth every 6 (six) hours as needed for muscle spasms.   metoprolol tartrate (LOPRESSOR) 50 MG tablet Take 1 tablet (50 mg total) by mouth 2 (two) times daily.   Multiple Vitamin (MULTIVITAMIN WITH MINERALS) TABS tablet Take 1 tablet by mouth daily.   perampanel (FYCOMPA) 2 MG tablet Take 1 tablet (2 mg total) by mouth at bedtime.   phenytoin (DILANTIN) 50 MG tablet Chew 1 tablet (50 mg total) by mouth daily.   senna (SENOKOT) 8.6 MG TABS tablet Take 2 tablets (17.2 mg total) by mouth daily.    Radiology:   No results found.  Cardiac Studies:   Echocardiogram 03/22/2021: 1. There is global hypokinesis and inferior and inferoseptal hypokinesis.  . Left ventricular ejection fraction, by estimation, is 40 to 45%. Left  ventricular ejection fraction by 2D MOD biplane is 43.7 %. The left  ventricle has mildly decreased  function. The left ventricle demonstrates regional wall motion  abnormalities (see scoring diagram/findings for description). There is  moderate concentric left ventricular hypertrophy. Left ventricular  diastolic parameters are consistent with Grade II  diastolic dysfunction (pseudonormalization). Elevated left ventricular  end-diastolic pressure. There is mild hypokinesis of the left ventricular,  entire inferoseptal wall and inferior segment.   2. Right ventricular systolic function is normal. The right ventricular  size is normal.   3. The mitral valve is normal in structure. No evidence of mitral valve  regurgitation. No evidence of mitral stenosis.   4. The aortic valve is normal in structure. Aortic valve regurgitation is  not visualized. No aortic stenosis is present.   5. The inferior vena cava is normal in size with greater than 50%  respiratory variability, suggesting right atrial pressure of 3 mmHg.   [facility] MYOCARDIAL PERFUSION WITH LEXISCAN 05/17/2021 1 Day Rest/Stress Protocol. Stress EKG is non-diagnostic, as this is pharmacological stress test using Lexiscan. Small size, moderate intensity, fixed perfusion defect involving the basal to mid inferior segments suggestive of prior infarct with peri-infarct ischemia cannot be entirely ruled out.  No apparent reversible myocardial ischemia. Hypertensive response at rest. Left ventricular size dilated. Calculated LVEF 32%, visually appears to be mildly reduced with inferior wall hypokinesis. Clinical correlation is strongly encouraged. No prior studies for comparison. Intermediate Risk  Study.  EKG:   02/24/2021: Normal sinus rhythm at rate of 97 bpm, biatrial enlargement, baseline artifact, nonspecific T abnormality..  Assessment     ICD-10-CM   1. Hypertensive heart disease without heart failure  I11.9 CBC    CMP14+EGFR    2. Abnormal stress test  R94.39 CBC    CMP14+EGFR    3. Left ventricular dysfunction  I51.9 CBC    CMP14+EGFR       There are no discontinued medications.   Meds ordered this encounter  Medications   atorvastatin (LIPITOR) 40 MG tablet    Sig: Take 1 tablet (40 mg total) by mouth daily.    Dispense:  90 tablet    Refill:  3    Recommendations:   KRITHIK MAPEL is a 61 y.o. AA male with history of seizure disorder, hypertension, prediabetes, alcohol abuse, tobacco use.    Patient was admitted 03/12/2019 - 03/16/2021 with acute encephalopathy.  Cardiology was consulted for management of hypertension at that time.  Echocardiogram during hospitalization suggested underlying cardiomyopathy without heart failure may be related to alcohol abuse versus uncontrolled hypertension.  Noted global hypokinesis with mild inferior wall motion abnormality, recommended outpatient stress testing.  Findings  suggestive of hypertensive cardiomyopathy/nonischemic cardiomyopathy.  Patient was discharged on amlodipine, hydralazine, Isordil, lisinopril, and clonidine patch.   Patient followed up in our office 04/28/2021 at which time started Lopressor 50 mg p.o. twice daily.  Since last office visit patient underwent nuclear stress test which showed small moderate intensity fixed perfusion defect suggestive of prior inferior infarct, patient also had hypertensive response at rest. He now presents for 1 month follow up.  Patient is feeling well overall without specific complaints and no clinical evidence of heart failure.  Reviewed and discussed results of nuclear stress test, details above.  Given left ventricular dysfunction and abnormal stress test discussed with  patient regarding proceeding with left heart catheterization for further ischemic evaluation.  The left heart catheterization procedure was explained to the patient in detail. The indication, alternatives, risks and benefits were reviewed. Complications including but not limited to bleeding, infection, acute kidney injury, blood transfusion, heart rhythm disturbances, contrast (dye) reaction, damage to the arteries or nerves in the legs or hands, cerebrovascular accident, myocardial infarction, need for emergent bypass surgery, blood clots in the legs, possible need for emergent blood transfusion, and rarely death were reviewed and discussed with the patient. The patient and his wife voiced understanding and wish to proceed.  Will continue aspirin, lisinopril, metoprolol, and add atorvastatin 40 mg p.o. daily.  Patient's blood pressure is now well controlled, will continue amlodipine, clonidine, hydralazine, and isosorbide dinitrate.  Follow-up after cardiac catheterization.   Alethia Berthold, PA-C 05/31/2021, 2:17 PM Office: [phone]

## 2021-06-05 ENCOUNTER — Other Ambulatory Visit: Payer: Self-pay

## 2021-06-06 ENCOUNTER — Other Ambulatory Visit: Payer: Medicaid Other

## 2021-06-06 DIAGNOSIS — I119 Hypertensive heart disease without heart failure: Secondary | ICD-10-CM

## 2021-06-06 DIAGNOSIS — R9439 Abnormal result of other cardiovascular function study: Secondary | ICD-10-CM

## 2021-06-06 DIAGNOSIS — I519 Heart disease, unspecified: Secondary | ICD-10-CM | POA: Diagnosis not present

## 2021-06-07 LAB — CBC
Hematocrit: 43.7 % (ref 37.5–51.0)
Hemoglobin: 14.4 g/dL (ref 13.0–17.7)
MCH: 26 pg — ABNORMAL LOW (ref 26.6–33.0)
MCHC: 33 g/dL (ref 31.5–35.7)
MCV: 79 fL (ref 79–97)
Platelets: 292 10*3/uL (ref 150–450)
RBC: 5.54 x10E6/uL (ref 4.14–5.80)
RDW: 15 % (ref 11.6–15.4)
WBC: 6.7 10*3/uL (ref 3.4–10.8)

## 2021-06-07 LAB — CMP14 + ANION GAP
ALT: 14 IU/L (ref 0–44)
AST: 20 IU/L (ref 0–40)
Albumin/Globulin Ratio: 1.4 (ref 1.2–2.2)
Albumin: 4.4 g/dL (ref 3.8–4.9)
Alkaline Phosphatase: 96 IU/L (ref 44–121)
Anion Gap: 15 mmol/L (ref 10.0–18.0)
BUN/Creatinine Ratio: 16 (ref 9–20)
BUN: 15 mg/dL (ref 6–24)
Bilirubin Total: 0.4 mg/dL (ref 0.0–1.2)
CO2: 20 mmol/L (ref 20–29)
Calcium: 9.3 mg/dL (ref 8.7–10.2)
Chloride: 104 mmol/L (ref 96–106)
Creatinine, Ser: 0.96 mg/dL (ref 0.76–1.27)
Globulin, Total: 3.1 g/dL (ref 1.5–4.5)
Glucose: 91 mg/dL (ref 70–99)
Potassium: 4.6 mmol/L (ref 3.5–5.2)
Sodium: 139 mmol/L (ref 134–144)
Total Protein: 7.5 g/dL (ref 6.0–8.5)
eGFR: 92 mL/min/{1.73_m2} (ref >59)

## 2021-06-08 ENCOUNTER — Other Ambulatory Visit: Payer: Self-pay

## 2021-06-27 ENCOUNTER — Encounter (HOSPITAL_COMMUNITY)
Admission: RE | Disposition: A | Discharge status: Home / Self Care | Payer: Self-pay | Source: Home / Self Care | Attending: Cardiology

## 2021-06-27 ENCOUNTER — Ambulatory Visit (HOSPITAL_COMMUNITY)
Admission: RE | Admit: 2021-06-27 | Discharge: 2021-06-27 | Disposition: A | Discharge status: Home / Self Care | Payer: Medicaid Other | Attending: Cardiology | Admitting: Cardiology

## 2021-06-27 ENCOUNTER — Other Ambulatory Visit: Payer: Self-pay

## 2021-06-27 ENCOUNTER — Other Ambulatory Visit: Payer: Self-pay | Admitting: Student

## 2021-06-27 DIAGNOSIS — I428 Other cardiomyopathies: Secondary | ICD-10-CM | POA: Diagnosis not present

## 2021-06-27 DIAGNOSIS — G40909 Epilepsy, unspecified, not intractable, without status epilepticus: Secondary | ICD-10-CM | POA: Diagnosis not present

## 2021-06-27 DIAGNOSIS — Z79899 Other long term (current) drug therapy: Secondary | ICD-10-CM | POA: Insufficient documentation

## 2021-06-27 DIAGNOSIS — I2510 Atherosclerotic heart disease of native coronary artery without angina pectoris: Secondary | ICD-10-CM

## 2021-06-27 DIAGNOSIS — F17290 Nicotine dependence, other tobacco product, uncomplicated: Secondary | ICD-10-CM | POA: Insufficient documentation

## 2021-06-27 DIAGNOSIS — I5020 Unspecified systolic (congestive) heart failure: Secondary | ICD-10-CM | POA: Insufficient documentation

## 2021-06-27 DIAGNOSIS — Z7984 Long term (current) use of oral hypoglycemic drugs: Secondary | ICD-10-CM | POA: Insufficient documentation

## 2021-06-27 DIAGNOSIS — I110 Hypertensive heart disease with heart failure: Secondary | ICD-10-CM | POA: Diagnosis not present

## 2021-06-27 DIAGNOSIS — E119 Type 2 diabetes mellitus without complications: Secondary | ICD-10-CM | POA: Diagnosis not present

## 2021-06-27 DIAGNOSIS — I119 Hypertensive heart disease without heart failure: Secondary | ICD-10-CM

## 2021-06-27 DIAGNOSIS — I25118 Atherosclerotic heart disease of native coronary artery with other forms of angina pectoris: Secondary | ICD-10-CM | POA: Diagnosis not present

## 2021-06-27 HISTORY — PX: CORONARY PRESSURE/FFR STUDY: CATH118243

## 2021-06-27 HISTORY — PX: LEFT HEART CATH AND CORONARY ANGIOGRAPHY: CATH118249

## 2021-06-27 LAB — GLUCOSE, CAPILLARY
Glucose-Capillary: 96 mg/dL (ref 70–99)
Glucose-Capillary: 92 mg/dL (ref 70–99)

## 2021-06-27 LAB — POCT ACTIVATED CLOTTING TIME
Activated Clotting Time: 275 seconds
Activated Clotting Time: 275 seconds
Activated Clotting Time: 311 seconds

## 2021-06-27 SURGERY — LEFT HEART CATH AND CORONARY ANGIOGRAPHY
Anesthesia: LOCAL

## 2021-06-27 MED ORDER — SODIUM CHLORIDE 0.9% FLUSH: 3.0000 mL | Freq: Two times a day (BID) | INTRAVENOUS | Status: DC

## 2021-06-27 MED ORDER — SODIUM CHLORIDE 0.9% FLUSH: 3.0000 mL | INTRAVENOUS | Status: DC | PRN

## 2021-06-27 MED ORDER — FENTANYL CITRATE (PF) 100 MCG/2ML IJ SOLN
INTRAMUSCULAR | Status: AC
  Filled 2021-06-27: qty 2

## 2021-06-27 MED ORDER — SODIUM CHLORIDE 0.9 % IV SOLN: 250.0000 mL | INTRAVENOUS | Status: DC | PRN

## 2021-06-27 MED ORDER — HEPARIN SODIUM (PORCINE) 1000 UNIT/ML IJ SOLN
INTRAMUSCULAR | Status: AC
  Filled 2021-06-27: qty 10

## 2021-06-27 MED ORDER — ONDANSETRON HCL 4 MG/2ML IJ SOLN: 4.0000 mg | Freq: Four times a day (QID) | INTRAMUSCULAR | Status: DC | PRN

## 2021-06-27 MED ORDER — HEPARIN (PORCINE) IN NACL 1000-0.9 UT/500ML-% IV SOLN
INTRAVENOUS | Status: AC
  Filled 2021-06-27: qty 1000

## 2021-06-27 MED ORDER — LABETALOL HCL 5 MG/ML IV SOLN
10.0000 mg | INTRAVENOUS | Status: DC | PRN
  Administered 2021-06-27: 10 mg via INTRAVENOUS
  Filled 2021-06-27: qty 4

## 2021-06-27 MED ORDER — VERAPAMIL HCL 2.5 MG/ML IV SOLN
INTRAVENOUS | Status: DC | PRN
  Administered 2021-06-27: 10 mL via INTRA_ARTERIAL

## 2021-06-27 MED ORDER — MIDAZOLAM HCL 2 MG/2ML IJ SOLN
INTRAMUSCULAR | Status: AC
  Filled 2021-06-27: qty 2

## 2021-06-27 MED ORDER — HEPARIN SODIUM (PORCINE) 1000 UNIT/ML IJ SOLN
INTRAMUSCULAR | Status: DC | PRN
  Administered 2021-06-27: 3000 [IU] via INTRAVENOUS
  Administered 2021-06-27: 3500 [IU] via INTRAVENOUS
  Administered 2021-06-27: 4000 [IU] via INTRAVENOUS

## 2021-06-27 MED ORDER — HEPARIN (PORCINE) IN NACL 1000-0.9 UT/500ML-% IV SOLN
INTRAVENOUS | Status: DC | PRN
  Administered 2021-06-27 (×2): 500 mL

## 2021-06-27 MED ORDER — ENTRESTO 49-51 MG PO TABS
1.0000 | ORAL_TABLET | Freq: Two times a day (BID) | ORAL | 2 refills | Status: DC
  Filled 2021-06-27 – 2021-09-07 (×3): qty 60, 30d supply, fill #0

## 2021-06-27 MED ORDER — ACETAMINOPHEN 325 MG PO TABS: 650.0000 mg | ORAL_TABLET | ORAL | Status: DC | PRN

## 2021-06-27 MED ORDER — FENTANYL CITRATE (PF) 100 MCG/2ML IJ SOLN
INTRAMUSCULAR | Status: DC | PRN
  Administered 2021-06-27: 50 ug via INTRAVENOUS

## 2021-06-27 MED ORDER — SODIUM CHLORIDE 0.9 % IV SOLN: INTRAVENOUS | Status: AC

## 2021-06-27 MED ORDER — MIDAZOLAM HCL 2 MG/2ML IJ SOLN
INTRAMUSCULAR | Status: DC | PRN
  Administered 2021-06-27: 1 mg via INTRAVENOUS

## 2021-06-27 MED ORDER — SODIUM CHLORIDE 0.9 % WEIGHT BASED INFUSION
3.0000 mL/kg/h | INTRAVENOUS | Status: AC
  Administered 2021-06-27: 3 mL/kg/h via INTRAVENOUS

## 2021-06-27 MED ORDER — NITROGLYCERIN 1 MG/10 ML FOR IR/CATH LAB
INTRA_ARTERIAL | Status: AC
  Filled 2021-06-27: qty 10

## 2021-06-27 MED ORDER — LIDOCAINE HCL (PF) 1 % IJ SOLN
INTRAMUSCULAR | Status: AC
  Filled 2021-06-27: qty 30

## 2021-06-27 MED ORDER — NITROGLYCERIN 1 MG/10 ML FOR IR/CATH LAB
INTRA_ARTERIAL | Status: DC | PRN
  Administered 2021-06-27 (×3): 200 ug via INTRACORONARY

## 2021-06-27 MED ORDER — HYDRALAZINE HCL 20 MG/ML IJ SOLN: 10.0000 mg | INTRAMUSCULAR | Status: DC | PRN

## 2021-06-27 MED ORDER — ASPIRIN 81 MG PO CHEW: 81.0000 mg | CHEWABLE_TABLET | ORAL | Status: DC

## 2021-06-27 MED ORDER — VERAPAMIL HCL 2.5 MG/ML IV SOLN
INTRAVENOUS | Status: AC
  Filled 2021-06-27: qty 2

## 2021-06-27 MED ORDER — IOHEXOL 350 MG/ML SOLN
INTRAVENOUS | Status: DC | PRN
  Administered 2021-06-27: 130 mL

## 2021-06-27 MED ORDER — SODIUM CHLORIDE 0.9 % WEIGHT BASED INFUSION: 1.0000 mL/kg/h | INTRAVENOUS | Status: DC

## 2021-06-27 MED ORDER — LIDOCAINE HCL (PF) 1 % IJ SOLN
INTRAMUSCULAR | Status: DC | PRN
  Administered 2021-06-27: 2 mL

## 2021-06-27 SURGICAL SUPPLY — 17 items
CATH INFINITI 5 FR JL3.5 (CATHETERS) ×1 IMPLANT
CATH LAUNCHER 6FR EBU3.5 (CATHETERS) ×1 IMPLANT
CATH OPTITORQUE TIG 4.0 5F (CATHETERS) ×1 IMPLANT
CATH VISTA GUIDE 6FR JR4 (CATHETERS) ×1 IMPLANT
CATH VISTA GUIDE 6FR XB3 (CATHETERS) ×1 IMPLANT
DEVICE RAD COMP TR BAND LRG (VASCULAR PRODUCTS) ×1 IMPLANT
GLIDESHEATH SLEND A-KIT 6F 22G (SHEATH) ×1 IMPLANT
GUIDEWIRE INQWIRE 1.5J.035X260 (WIRE) IMPLANT
GUIDEWIRE PRESSURE X 175 (WIRE) ×1 IMPLANT
INQWIRE 1.5J .035X260CM (WIRE) ×2
KIT ESSENTIALS PG (KITS) ×1 IMPLANT
KIT HEART LEFT (KITS) ×3 IMPLANT
PACK CARDIAC CATHETERIZATION (CUSTOM PROCEDURE TRAY) ×3 IMPLANT
SHEATH PROBE COVER 6X72 (BAG) ×1 IMPLANT
TRANSDUCER W/STOPCOCK (MISCELLANEOUS) ×3 IMPLANT
TUBING CIL FLEX 10 FLL-RA (TUBING) ×3 IMPLANT
VALVE COPILOT STAT (MISCELLANEOUS) ×1 IMPLANT

## 2021-06-27 NOTE — Interval H&P Note (Signed)
History and Physical Interval Note:  06/27/2021 11:57 AM  Arthur [name]  has presented today for surgery, with the diagnosis of positive stress test - LV disfunction.  The various methods of treatment have been discussed with the patient and family. After consideration of risks, benefits and other options for treatment, the patient has consented to  Procedure(s): LEFT HEART CATH AND CORONARY ANGIOGRAPHY (N/A) as a surgical intervention.  The patient's history has been reviewed, patient examined, no change in status, stable for surgery.  I have reviewed the patient's chart and labs.  Questions were answered to the patient's satisfaction.    2016/2017 Appropriate Use Criteria for Coronary Revascularization Symptom Status: Ischemic Symptoms  Non-invasive Testing: High risk  If no or indeterminate stress test, FFR/iFR results in all diseased vessels: N/A  Diabetes Mellitus: No  S/P CABG: No  Antianginal therapy (number of long-acting drugs): >=2  Patient undergoing renal transplant: No  Patient undergoing percutaneous valve procedure: No  1 Vessel Disease PCI CABG  No proximal LAD involvement, No proximal left dominant LCX involvement A (8); Indication 2 M (6); Indication 2  Proximal left dominant LCX involvement A (8); Indication 5 A (8); Indication 5  Proximal LAD involvement A (8); Indication 5 A (8); Indication 5  2 Vessel Disease  No proximal LAD involvement A (8); Indication 8 A (7); Indication 8  Proximal LAD involvement A (8); Indication 11 A (8); Indication 11  3 Vessel Disease  Low disease complexity (e.g., focal stenoses, SYNTAX <=22) A (8); Indication 17 A (8); Indication 17  Intermediate or high disease complexity (e.g., SYNTAX >=23) M (6); Indication 21 A (9); Indication 21  Left Main Disease  Isolated LMCA disease: ostial or midshaft A (7); Indication 24 A (9); Indication 24  Isolated LMCA disease: bifurcation involvement M (6); Indication 25 A (9); Indication 25  LMCA ostial  or midshaft, concurrent low disease burden multivessel disease (e.g., 1-2 additional focal stenoses, SYNTAX <=22) A (7); Indication 26 A (9); Indication 26  LMCA ostial or midshaft, concurrent intermediate or high disease burden multivessel disease (e.g., 1-2 additional bifurcation stenoses, long stenoses, SYNTAX >=23) M (4); Indication 27 A (9); Indication 27  LMCA bifurcation involvement, concurrent low disease burden multivessel disease (e.g., 1-2 additional focal stenoses, SYNTAX <=22) M (6); Indication 28 A (9); Indication 28  LMCA bifurcation involvement, concurrent intermediate or high disease burden multivessel disease (e.g., 1-2 additional bifurcation stenoses, long stenoses, SYNTAX >=23) R (3); Indication 29 A (9); Indication 29       Anish Vana J Braxtin Bamba

## 2021-06-27 NOTE — H&P (Signed)
OV 05/30/2021 copied for documentation    Primary Physician/Referring:  [name], MD  Patient ID: Arthur [name], male    DOB: 1961-02-16, 61 y.o.   MRN: [id number]  Chief Complaint  Patient presents with   Hypertension   Follow-up   HPI:    Arthur [name]  is a 61 y.o. AA male with history of seizure disorder, hypertension, prediabetes, alcohol abuse, tobacco use.    Patient was admitted 03/12/2019 - 03/16/2021 with acute encephalopathy.  Cardiology was consulted for management of hypertension at that time.  Echocardiogram during hospitalization suggested underlying cardiomyopathy without heart failure may be related to alcohol abuse versus uncontrolled hypertension.  Noted global hypokinesis with mild inferior wall motion abnormality, recommended outpatient stress testing.  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  Patient was discharged on amlodipine, hydralazine, Isordil, lisinopril, and clonidine  patch.   Patient followed up in our office 04/28/2021 at which time started Lopressor 50 mg p.o. twice daily.  Since last office visit patient underwent nuclear stress test which showed small moderate intensity fixed perfusion defect suggestive of prior inferior infarct, patient also had hypertensive response at rest. He now presents for 1 month follow up accompanied by a home health aide.  Patient reports he is feeling well overall without specific complaints. Denies chest pain, palpitations, syncope, near syncope, dyspnea, leg edema, orthopnea.  Patient's home health nurse is present at bedside and his wife is on the phone, both contribute to history as well.   Past Medical History:  Diagnosis Date   DM2 (diabetes mellitus, type 2) ([place])    ETOH abuse    Hypertension    Seizures ([place])    Past Surgical History:  Procedure Laterality Date   HEMORRHOID SURGERY     Family History  Problem Relation Age of Onset   Diabetes Mother    Hypertension Mother     Diabetes Father    Hypertension Father    Hypertension Sister     Social History   Tobacco Use   Smoking status: Every Day    Types: Cigars   Smokeless tobacco: Never   Tobacco comments:    Smokes black& mild - 1 a day.  Substance Use Topics   Alcohol use: Not Currently   Marital Status: Married   ROS  Review of Systems  Constitutional: Negative for malaise/fatigue and weight gain.  Cardiovascular:  Negative for chest pain, claudication, leg swelling, near-syncope, orthopnea, palpitations, paroxysmal nocturnal dyspnea and syncope.  Respiratory:  Negative for shortness of breath.   Neurological:  Negative for dizziness.   Objective  Blood pressure 120/73, pulse 84, temperature 98.5 F (36.9 C), temperature source Temporal, height _0  (1.6 m), weight 157 lb 9.6 oz (71.5 kg), SpO2 96 %.  Vitals with BMI 05/31/2021 04/28/2021 04/28/2021  Height _1  - _2   Weight 157 lbs 10 oz - 155 lbs  BMI 93.81 - 82.99  Systolic 371 696 789  Diastolic 73 91 94  Pulse 84 103 100      Physical Exam Vitals reviewed.  Cardiovascular:     Rate and Rhythm: Normal rate and regular rhythm.     Pulses: Intact distal pulses.     Heart sounds: S1 normal and S2 normal. No murmur heard.   No gallop.  Pulmonary:     Effort: Pulmonary effort is normal. No respiratory distress.     Breath sounds: No wheezing, rhonchi or rales.  Musculoskeletal:     Right lower leg: No edema.  Left lower leg: No edema.  Neurological:     Mental Status: He is alert.    Laboratory examination:   Recent Labs    03/20/21 0540 03/23/21 0521 03/24/21 0505  NA 137 137 132*  K 4.0 4.0 3.7  CL 103 106 101  CO2 _0 GLUCOSE 100* 99 106*  BUN _1 CREATININE 1.04 1.49* 1.24  CALCIUM 9.2 8.9 8.8*  GFRNONAA >60 54* >60   CrCl cannot be calculated (Patient's most recent lab result is older than the maximum 21 days allowed.).  CMP Latest Ref Rng & Units 03/24/2021 03/23/2021 03/20/2021  Glucose  70 - 99 mg/dL 106(H) 99 100(H)  BUN 6 - 20 mg/dL _2 Creatinine 0.61 - 1.24 mg/dL 1.24 1.49(H) 1.04  Sodium 135 - 145 mmol/L 132(L) 137 137  Potassium 3.5 - 5.1 mmol/L 3.7 4.0 4.0  Chloride 98 - 111 mmol/L 101 106 103  CO2 22 - 32 mmol/L _3 Calcium 8.9 - 10.3 mg/dL 8.8(L) 8.9 9.2  Total Protein 6.5 - 8.1 g/dL - - -  Total Bilirubin 0.3 - 1.2 mg/dL - - -  Alkaline Phos 38 - 126 U/L - - -  AST 15 - 41 U/L - - -  ALT 0 - 44 U/L - - -   CBC Latest Ref Rng & Units 03/20/2021 03/13/2021 02/27/2021  WBC 4.0 - 10.5 K/uL 6.3 5.4 7.9  Hemoglobin 13.0 - 17.0 g/dL 13.2 13.0 14.4  Hematocrit 39.0 - 52.0 % 40.9 39.1 42.7  Platelets 150 - 400 K/uL 380 349 233    Lipid Panel Recent Labs    10/17/20 0151 10/18/20 0735 03/23/21 0521  CHOL 217*  --  147  TRIG 480* 485* 127  LDLCALC UNABLE TO CALCULATE IF TRIGLYCERIDE OVER 400 mg/dL  --  86  VLDL UNABLE TO CALCULATE IF TRIGLYCERIDE OVER 400 mg/dL  --  25  HDL <10*  --  36*  CHOLHDL NOT CALCULATED  --  4.1  LDLDIRECT 124.3*  --   --     HEMOGLOBIN A1C Lab Results  Component Value Date   HGBA1C 6.0 (H) 02/27/2021   MPG 125.5 02/27/2021   TSH Recent Labs    02/27/21 0244  TSH 1.377    External labs:  None   Allergies   Allergies  Allergen Reactions   Penicillins Hives    Did it involve swelling of the face/tongue/throat, SOB, or low BP? Y Did it involve sudden or severe rash/hives, skin peeling, or any reaction on the inside of your mouth or nose? N Did you need to seek medical attention at a hospital or doctor's office? Y When did it last happen?  Over 5 Years Ago     If all above answers are NO, may proceed with cephalosporin use.     Medications Prior to Visit:   Outpatient Medications Prior to Visit  Medication Sig Dispense Refill   acetaminophen (TYLENOL) 325 MG tablet Take 2 tablets (650 mg total) by mouth every 6 (six) hours as needed for mild pain (or Fever >/= 101).     amLODipine (NORVASC) 10 MG  tablet Take 1 tablet (10 mg total) by mouth daily. 90 tablet 3   aspirin EC 81 MG tablet Take 81 mg by mouth daily. Swallow whole.     clonazePAM (KLONOPIN) 1 MG tablet Take 1 tablet (1 mg total) by mouth 2 (two) times daily. 60 tablet 0   cloNIDine (CATAPRES -  DOSED IN MG/24 HR) 0.2 mg/24hr patch Place 1 patch (0.2 mg total) onto the skin once a week. 4 patch 12   hydrALAZINE (APRESOLINE) 50 MG tablet Take 1 tablet (50 mg total) by mouth every 8 (eight) hours. 270 tablet 3   HYDROcodone-acetaminophen (NORCO/VICODIN) 5-325 MG tablet Take 1 tablet by mouth every 4 (four) hours as needed for moderate pain. 30 tablet 0   isosorbide dinitrate (ISORDIL) 30 MG tablet Take 1 tablet (30 mg total) by mouth 3 (three) times daily. 270 tablet 3   lacosamide (VIMPAT) 200 MG TABS tablet Take 1 tablet (200 mg total) by mouth 2 (two) times daily. 120 tablet 2   levETIRAcetam (KEPPRA) 750 MG tablet Take 2 tablets (1,500 mg total) by mouth 2 (two) times daily. 120 tablet 3   lisinopril (ZESTRIL) 20 MG tablet Take 1 tablet (20 mg total) by mouth 2 (two) times daily. 180 tablet 3   metFORMIN (GLUCOPHAGE) 500 MG tablet Take 1 tablet (500 mg total) by mouth 2 (two) times daily with a meal. IM PROGRAM 60 tablet 0   methocarbamol (ROBAXIN) 500 MG tablet Take 1 tablet (500 mg total) by mouth every 6 (six) hours as needed for muscle spasms. 60 tablet 0   metoprolol tartrate (LOPRESSOR) 50 MG tablet Take 1 tablet (50 mg total) by mouth 2 (two) times daily. 180 tablet 3   Multiple Vitamin (MULTIVITAMIN WITH MINERALS) TABS tablet Take 1 tablet by mouth daily.     perampanel (FYCOMPA) 2 MG tablet Take 1 tablet (2 mg total) by mouth at bedtime. 3 tablet 0   phenytoin (DILANTIN) 50 MG tablet Chew 1 tablet (50 mg total) by mouth daily. 3 tablet 0   senna (SENOKOT) 8.6 MG TABS tablet Take 2 tablets (17.2 mg total) by mouth daily. 120 tablet 0   No facility-administered medications prior to visit.   Final Medications at End of  Visit    Current Meds  Medication Sig   acetaminophen (TYLENOL) 325 MG tablet Take 2 tablets (650 mg total) by mouth every 6 (six) hours as needed for mild pain (or Fever >/= 101).   amLODipine (NORVASC) 10 MG tablet Take 1 tablet (10 mg total) by mouth daily.   aspirin EC 81 MG tablet Take 81 mg by mouth daily. Swallow whole.   atorvastatin (LIPITOR) 40 MG tablet Take 1 tablet (40 mg total) by mouth daily.   clonazePAM (KLONOPIN) 1 MG tablet Take 1 tablet (1 mg total) by mouth 2 (two) times daily.   cloNIDine (CATAPRES - DOSED IN MG/24 HR) 0.2 mg/24hr patch Place 1 patch (0.2 mg total) onto the skin once a week.   hydrALAZINE (APRESOLINE) 50 MG tablet Take 1 tablet (50 mg total) by mouth every 8 (eight) hours.   HYDROcodone-acetaminophen (NORCO/VICODIN) 5-325 MG tablet Take 1 tablet by mouth every 4 (four) hours as needed for moderate pain.   isosorbide dinitrate (ISORDIL) 30 MG tablet Take 1 tablet (30 mg total) by mouth 3 (three) times daily.   lacosamide (VIMPAT) 200 MG TABS tablet Take 1 tablet (200 mg total) by mouth 2 (two) times daily.   levETIRAcetam (KEPPRA) 750 MG tablet Take 2 tablets (1,500 mg total) by mouth 2 (two) times daily.   lisinopril (ZESTRIL) 20 MG tablet Take 1 tablet (20 mg total) by mouth 2 (two) times daily.   metFORMIN (GLUCOPHAGE) 500 MG tablet Take 1 tablet (500 mg total) by mouth 2 (two) times daily with a meal. IM PROGRAM   methocarbamol (ROBAXIN)  500 MG tablet Take 1 tablet (500 mg total) by mouth every 6 (six) hours as needed for muscle spasms.   metoprolol tartrate (LOPRESSOR) 50 MG tablet Take 1 tablet (50 mg total) by mouth 2 (two) times daily.   Multiple Vitamin (MULTIVITAMIN WITH MINERALS) TABS tablet Take 1 tablet by mouth daily.   perampanel (FYCOMPA) 2 MG tablet Take 1 tablet (2 mg total) by mouth at bedtime.   phenytoin (DILANTIN) 50 MG tablet Chew 1 tablet (50 mg total) by mouth daily.   senna (SENOKOT) 8.6 MG TABS tablet Take 2 tablets (17.2 mg  total) by mouth daily.   Radiology:   No results found.  Cardiac Studies:   Echocardiogram 03/22/2021: 1. There is global hypokinesis and inferior and inferoseptal hypokinesis.  . Left ventricular ejection fraction, by estimation, is 40 to 45%. Left  ventricular ejection fraction by 2D MOD biplane is 43.7 %. The left  ventricle has mildly decreased  function. The left ventricle demonstrates regional wall motion  abnormalities (see scoring diagram/findings for description). There is  moderate concentric left ventricular hypertrophy. Left ventricular  diastolic parameters are consistent with Grade II  diastolic dysfunction (pseudonormalization). Elevated left ventricular  end-diastolic pressure. There is mild hypokinesis of the left ventricular,  entire inferoseptal wall and inferior segment.   2. Right ventricular systolic function is normal. The right ventricular  size is normal.   3. The mitral valve is normal in structure. No evidence of mitral valve  regurgitation. No evidence of mitral stenosis.   4. The aortic valve is normal in structure. Aortic valve regurgitation is  not visualized. No aortic stenosis is present.   5. The inferior vena cava is normal in size with greater than 50%  respiratory variability, suggesting right atrial pressure of 3 mmHg.   PCV MYOCARDIAL PERFUSION WITH LEXISCAN 05/17/2021 1 Day Rest/Stress Protocol. Stress EKG is non-diagnostic, as this is pharmacological stress test using Lexiscan. Small size, moderate intensity, fixed perfusion defect involving the basal to mid inferior segments suggestive of prior infarct with peri-infarct ischemia cannot be entirely ruled out.  No apparent reversible myocardial ischemia. Hypertensive response at rest. Left ventricular size dilated. Calculated LVEF 32%, visually appears to be mildly reduced with inferior wall hypokinesis. Clinical correlation is strongly encouraged. No prior studies for  comparison. Intermediate Risk Study.  EKG:   02/24/2021: Normal sinus rhythm at rate of 97 bpm, biatrial enlargement, baseline artifact, nonspecific T abnormality..  Assessment     ICD-10-CM   1. Hypertensive heart disease without heart failure  I11.9 CBC    CMP14+EGFR    2. Abnormal stress test  R94.39 CBC    CMP14+EGFR    3. Left ventricular dysfunction  I51.9 CBC    CMP14+EGFR       There are no discontinued medications.   Meds ordered this encounter  Medications   atorvastatin (LIPITOR) 40 MG tablet    Sig: Take 1 tablet (40 mg total) by mouth daily.    Dispense:  90 tablet    Refill:  3    Recommendations:   Arthur [name] is a 61 y.o. AA male with history of seizure disorder, hypertension, prediabetes, alcohol abuse, tobacco use.    Patient was admitted 03/12/2019 - 03/16/2021 with acute encephalopathy.  Cardiology was consulted for management of hypertension at that time.  Echocardiogram during hospitalization suggested underlying cardiomyopathy without heart failure may be related to alcohol abuse versus uncontrolled hypertension.  Noted global hypokinesis with mild inferior wall motion abnormality, recommended outpatient  stress testing.  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  Patient was discharged on amlodipine, hydralazine, Isordil, lisinopril, and clonidine patch.   Patient followed up in our office 04/28/2021 at which time started Lopressor 50 mg p.o. twice daily.  Since last office visit patient underwent nuclear stress test which showed small moderate intensity fixed perfusion defect suggestive of prior inferior infarct, patient also had hypertensive response at rest. He now presents for 1 month follow up.  Patient is feeling well overall without specific complaints and no clinical evidence of heart failure.  Reviewed and discussed results of nuclear stress test, details above.  Given left ventricular dysfunction and abnormal  stress test discussed with patient regarding proceeding with left heart catheterization for further ischemic evaluation.  The left heart catheterization procedure was explained to the patient in detail. The indication, alternatives, risks and benefits were reviewed. Complications including but not limited to bleeding, infection, acute kidney injury, blood transfusion, heart rhythm disturbances, contrast (dye) reaction, damage to the arteries or nerves in the legs or hands, cerebrovascular accident, myocardial infarction, need for emergent bypass surgery, blood clots in the legs, possible need for emergent blood transfusion, and rarely death were reviewed and discussed with the patient. The patient and his wife voiced understanding and wish to proceed.  Will continue aspirin, lisinopril, metoprolol, and add atorvastatin 40 mg p.o. daily.  Patient's blood pressure is now well controlled, will continue amlodipine, clonidine, hydralazine, and isosorbide dinitrate.  Follow-up after cardiac catheterization.   Alethia Berthold, PA-C 05/31/2021, 2:17 PM Office: [phone]

## 2021-06-28 ENCOUNTER — Encounter (HOSPITAL_COMMUNITY): Payer: Self-pay | Admitting: Cardiology

## 2021-07-06 ENCOUNTER — Ambulatory Visit: Payer: Medicaid Other | Admitting: Student

## 2021-07-11 ENCOUNTER — Encounter: Payer: Self-pay | Admitting: Student

## 2021-07-11 ENCOUNTER — Other Ambulatory Visit: Payer: Self-pay

## 2021-07-11 ENCOUNTER — Ambulatory Visit: Payer: Medicaid Other | Admitting: Student

## 2021-07-11 VITALS — BP 128/74 | HR 93 | Temp 98.0°F | Resp 17 | Ht 63.0 in | Wt 158.0 lb

## 2021-07-11 DIAGNOSIS — I10 Essential (primary) hypertension: Secondary | ICD-10-CM

## 2021-07-11 DIAGNOSIS — I119 Hypertensive heart disease without heart failure: Secondary | ICD-10-CM | POA: Diagnosis not present

## 2021-07-11 MED ORDER — SPIRONOLACTONE 25 MG PO TABS
12.5000 mg | ORAL_TABLET | Freq: Every day | ORAL | 3 refills | Status: DC
  Filled 2021-07-11: qty 45, 90d supply, fill #0

## 2021-07-11 MED ORDER — ASPIRIN EC 81 MG PO TBEC
81.0000 mg | DELAYED_RELEASE_TABLET | Freq: Every day | ORAL | 3 refills | Status: DC
  Filled 2021-07-11: qty 90, 90d supply, fill #0

## 2021-07-11 NOTE — Progress Notes (Signed)
Primary Physician/Referring:  [name], MD  Patient ID: Arthur [name], male    DOB: 14-Dec-1960, 61 y.o.   MRN: [id number]  Chief Complaint  Patient presents with   post cath    2 weeks   HPI:    Arthur [name]  is a 61 y.o. AA male with history of seizure disorder, hypertension, prediabetes, alcohol abuse, tobacco use.    Patient was admitted 03/12/2019 - 03/16/2021 with acute encephalopathy at which time echo suggested cardiomyopathy and mild inferior wall motion abnormality. Patient subsequently underwent nuclear stress test which showed small moderate intensity fixed perfusion defect suggestive of prior inferior infarct.  Given abnormal echocardiogram and stress test patient subsequently underwent left heart catheterization.   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  Given findings on coronary angiography Dr. Randa Lynn felt most likely explanation for patient's cardiomyopathy was alcoholic or hypertensive, therefore transitioned patient from lisinopril to [facility] and recommended guideline directed medical therapy.  Notably could consider diagonal revascularization for possible anginal equivalent dyspnea if low LVEF persists despite guideline directed medical therapy.  Patient now presents for follow up after cardiac cath.  Patient states he is feeling well overall without specific complaints.  He is tolerating Entresto without issue.  He continues to have dyspnea, however this remains stable.  He continues to refrain from alcohol use over the last 1 year.  Notably on medication reconciliation patient has only been wearing clonidine patch intermittently for the last several weeks.  Past Medical History:  Diagnosis Date   DM2 (diabetes mellitus, type 2) ([place])    ETOH abuse    Hypertension    Seizures ([place])    Past Surgical History:  Procedure  Laterality Date   HEMORRHOID SURGERY     INTRAVASCULAR PRESSURE WIRE/FFR STUDY N/A 06/27/2021   Procedure: INTRAVASCULAR PRESSURE WIRE/FFR STUDY;  Surgeon: Nigel Mormon, MD;  Location: [facility];  Service: Cardiovascular;  Laterality: N/A;   LEFT HEART CATH AND CORONARY ANGIOGRAPHY N/A 06/27/2021   Procedure: LEFT HEART CATH AND CORONARY ANGIOGRAPHY;  Surgeon: Nigel Mormon, MD;  Location: [facility];  Service: Cardiovascular;  Laterality: N/A;   Family History  Problem Relation Age of Onset   Diabetes Mother    Hypertension Mother    Diabetes Father    Hypertension Father    Hypertension Sister     Social History   Tobacco Use   Smoking status: Every Day    Types: Cigars   Smokeless tobacco: Never   Tobacco comments:    Smokes black& mild - 1 a day.  Substance Use Topics   Alcohol use: Not Currently   Marital Status: Married   ROS  Review of Systems  Cardiovascular:  Positive for dyspnea on exertion (stable, mild). Negative for chest pain, claudication, leg swelling, near-syncope, orthopnea, palpitations, paroxysmal nocturnal dyspnea and syncope.  Respiratory:  Negative for shortness of breath.   Neurological:  Negative for dizziness.   Objective  Blood pressure 128/74, pulse 93, temperature 98 F (36.7 C), temperature source Temporal, resp. rate 17, height 5\' 3"  (1.6 m), weight 158 lb (71.7 kg), SpO2 97 %.  Vitals with BMI 07/11/2021 06/27/2021 06/27/2021  Height 5\' 3"  - -  Weight 158 lbs - -  BMI 28 - -  Systolic 0000000 [id number] Q000111Q  Diastolic 74 [id number] 91  Pulse 93 69 71  Physical Exam Vitals reviewed.  HENT:     Head: Normocephalic and atraumatic.  Cardiovascular:     Rate and Rhythm: Normal rate and regular rhythm.     Pulses: Intact distal pulses.     Heart sounds: S1 normal and S2 normal. No murmur heard.   No gallop.  Pulmonary:     Effort: Pulmonary effort is normal. No respiratory distress.     Breath sounds: No  wheezing, rhonchi or rales.  Musculoskeletal:     Right lower leg: No edema.     Left lower leg: No edema.  Neurological:     Mental Status: He is alert.    Laboratory examination:   CrCl cannot be calculated (Patient's most recent lab result is older than the maximum 21 days allowed.).  CMP Latest Ref Rng & Units 06/06/2021 03/24/2021 03/23/2021  Glucose 70 - 99 mg/dL 91 106(H) 99  BUN 6 - 24 mg/dL 15 20 19   Creatinine 0.76 - 1.27 mg/dL 0.96 1.24 1.49(H)  Sodium 134 - 144 mmol/L 139 132(L) 137  Potassium 3.5 - 5.2 mmol/L 4.6 3.7 4.0  Chloride 96 - 106 mmol/L 104 101 106  CO2 20 - 29 mmol/L 20 24 25   Calcium 8.7 - 10.2 mg/dL 9.3 8.8(L) 8.9  Total Protein 6.0 - 8.5 g/dL 7.5 - -  Total Bilirubin 0.0 - 1.2 mg/dL 0.4 - -  Alkaline Phos 44 - 121 IU/L 96 - -  AST 0 - 40 IU/L 20 - -  ALT 0 - 44 IU/L 14 - -   CBC Latest Ref Rng & Units 06/06/2021 03/20/2021 03/13/2021  WBC 3.4 - 10.8 x10E3/uL 6.7 6.3 5.4  Hemoglobin 13.0 - 17.7 g/dL 14.4 13.2 13.0  Hematocrit 37.5 - 51.0 % 43.7 40.9 39.1  Platelets 150 - 450 x10E3/uL 292 380 349    Lipid Panel Recent Labs    10/17/20 0151 10/18/20 0735 03/23/21 0521  CHOL 217*  --  147  TRIG 480* 485* 127  LDLCALC UNABLE TO CALCULATE IF TRIGLYCERIDE OVER 400 mg/dL  --  86  VLDL UNABLE TO CALCULATE IF TRIGLYCERIDE OVER 400 mg/dL  --  25  HDL <10*  --  36*  CHOLHDL NOT CALCULATED  --  4.1  LDLDIRECT 124.3*  --   --     HEMOGLOBIN A1C Lab Results  Component Value Date   HGBA1C 6.0 (H) 02/27/2021   MPG 125.5 02/27/2021   TSH Recent Labs    02/27/21 0244  TSH 1.377   External labs:  None   Allergies   Allergies  Allergen Reactions   Penicillins Hives    Did it involve swelling of the face/tongue/throat, SOB, or low BP? Y Did it involve sudden or severe rash/hives, skin peeling, or any reaction on the inside of your mouth or nose? N Did you need to seek medical attention at a hospital or doctor's office? Y When did it last  happen?  Over 5 Years Ago     If all above answers are NO, may proceed with cephalosporin use.     Medications Prior to Visit:   Outpatient Medications Prior to Visit  Medication Sig Dispense Refill   acetaminophen (TYLENOL) 325 MG tablet Take 2 tablets (650 mg total) by mouth every 6 (six) hours as needed for mild pain (or Fever >/= 101).     amLODipine (NORVASC) 10 MG tablet Take 1 tablet (10 mg total) by mouth daily. 90 tablet 3   atorvastatin (LIPITOR) 40 MG tablet Take 1  tablet (40 mg total) by mouth daily. 90 tablet 3   clonazePAM (KLONOPIN) 1 MG tablet Take 1 mg by mouth 2 (two) times daily.     hydrALAZINE (APRESOLINE) 50 MG tablet Take 1 tablet (50 mg total) by mouth every 8 (eight) hours. 270 tablet 3   HYDROcodone-acetaminophen (NORCO/VICODIN) 5-325 MG tablet Take 1 tablet by mouth every 4 (four) hours as needed for moderate pain. 30 tablet 0   isosorbide dinitrate (ISORDIL) 30 MG tablet Take 1 tablet (30 mg total) by mouth 3 (three) times daily. 270 tablet 3   lacosamide (VIMPAT) 200 MG TABS tablet Take 1 tablet (200 mg total) by mouth 2 (two) times daily. 120 tablet 2   levETIRAcetam (KEPPRA) 750 MG tablet Take 1,500 mg by mouth 2 (two) times daily.     metFORMIN (GLUCOPHAGE) 500 MG tablet Take 1 tablet (500 mg total) by mouth 2 (two) times daily with a meal. IM PROGRAM 60 tablet 0   methocarbamol (ROBAXIN) 500 MG tablet Take 1 tablet (500 mg total) by mouth every 6 (six) hours as needed for muscle spasms. 60 tablet 0   metoprolol tartrate (LOPRESSOR) 50 MG tablet Take 1 tablet (50 mg total) by mouth 2 (two) times daily. 180 tablet 3   Multiple Vitamin (MULTIVITAMIN WITH MINERALS) TABS tablet Take 1 tablet by mouth daily.     perampanel (FYCOMPA) 2 MG tablet Take 1 tablet (2 mg total) by mouth at bedtime. 3 tablet 0   phenytoin (DILANTIN) 50 MG tablet Chew 1 tablet (50 mg total) by mouth daily. 3 tablet 0   sacubitril-valsartan (ENTRESTO) 49-51 MG Take 1 tablet  by mouth 2 (two) times daily. Start on 06/29/2021 60 tablet 2   senna (SENOKOT) 8.6 MG TABS tablet Take 2 tablets (17.2 mg total) by mouth daily. 120 tablet 0   cloNIDine (CATAPRES - DOSED IN MG/24 HR) 0.2 mg/24hr patch Place 1 patch (0.2 mg total) onto the skin once a week. 4 patch 12   No facility-administered medications prior to visit.   Final Medications at End of Visit    Current Meds  Medication Sig   acetaminophen (TYLENOL) 325 MG tablet Take 2 tablets (650 mg total) by mouth every 6 (six) hours as needed for mild pain (or Fever >/= 101).   amLODipine (NORVASC) 10 MG tablet Take 1 tablet (10 mg total) by mouth daily.   aspirin EC 81 MG tablet Take 1 tablet (81 mg total) by mouth daily. Swallow whole.   atorvastatin (LIPITOR) 40 MG tablet Take 1 tablet (40 mg total) by mouth daily.   clonazePAM (KLONOPIN) 1 MG tablet Take 1 mg by mouth 2 (two) times daily.   hydrALAZINE (APRESOLINE) 50 MG tablet Take 1 tablet (50 mg total) by mouth every 8 (eight) hours.   HYDROcodone-acetaminophen (NORCO/VICODIN) 5-325 MG tablet Take 1 tablet by mouth every 4 (four) hours as needed for moderate pain.   isosorbide dinitrate (ISORDIL) 30 MG tablet Take 1 tablet (30 mg total) by mouth 3 (three) times daily.   lacosamide (VIMPAT) 200 MG TABS tablet Take 1 tablet (200 mg total) by mouth 2 (two) times daily.   levETIRAcetam (KEPPRA) 750 MG tablet Take 1,500 mg by mouth 2 (two) times daily.   metFORMIN (GLUCOPHAGE) 500 MG tablet Take 1 tablet (500 mg total) by mouth 2 (two) times daily with a meal. IM PROGRAM   methocarbamol (ROBAXIN) 500 MG tablet Take 1 tablet (500 mg total) by mouth every 6 (six) hours as needed for  muscle spasms.   metoprolol tartrate (LOPRESSOR) 50 MG tablet Take 1 tablet (50 mg total) by mouth 2 (two) times daily.   Multiple Vitamin (MULTIVITAMIN WITH MINERALS) TABS tablet Take 1 tablet by mouth daily.   perampanel (FYCOMPA) 2 MG tablet Take 1 tablet (2 mg total) by  mouth at bedtime.   phenytoin (DILANTIN) 50 MG tablet Chew 1 tablet (50 mg total) by mouth daily.   sacubitril-valsartan (ENTRESTO) 49-51 MG Take 1 tablet by mouth 2 (two) times daily. Start on 06/29/2021   senna (SENOKOT) 8.6 MG TABS tablet Take 2 tablets (17.2 mg total) by mouth daily.   spironolactone (ALDACTONE) 25 MG tablet Take 0.5 tablets (12.5 mg total) by mouth daily.   [DISCONTINUED] cloNIDine (CATAPRES - DOSED IN MG/24 HR) 0.2 mg/24hr patch Place 1 patch (0.2 mg total) onto the skin once a week.   Radiology:   No results found.  Cardiac Studies:   Echocardiogram 03/22/2021: 1. There is global hypokinesis and inferior and inferoseptal hypokinesis.  . Left ventricular ejection fraction, by estimation, is 40 to 45%. Left  ventricular ejection fraction by 2D MOD biplane is 43.7 %. The left  ventricle has mildly decreased  function. The left ventricle demonstrates regional wall motion  abnormalities (see scoring diagram/findings for description). There is  moderate concentric left ventricular hypertrophy. Left ventricular  diastolic parameters are consistent with Grade II  diastolic dysfunction (pseudonormalization). Elevated left ventricular  end-diastolic pressure. There is mild hypokinesis of the left ventricular,  entire inferoseptal wall and inferior segment.   2. Right ventricular systolic function is normal. The right ventricular  size is normal.   3. The mitral valve is normal in structure. No evidence of mitral valve  regurgitation. No evidence of mitral stenosis.   4. The aortic valve is normal in structure. Aortic valve regurgitation is  not visualized. No aortic stenosis is present.   5. The inferior vena cava is normal in size with greater than 50%  respiratory variability, suggesting right atrial pressure of 3 mmHg.   PCV MYOCARDIAL PERFUSION WITH LEXISCAN 05/17/2021 1 Day Rest/Stress Protocol. Stress EKG is non-diagnostic, as this is pharmacological stress  test using Lexiscan. Small size, moderate intensity, fixed perfusion defect involving the basal to mid inferior segments suggestive of prior infarct with peri-infarct ischemia cannot be entirely ruled out.  No apparent reversible myocardial ischemia. Hypertensive response at rest. Left ventricular size dilated. Calculated LVEF 32%, visually appears to be mildly reduced with inferior wall hypokinesis. Clinical correlation is strongly encouraged. No prior studies for comparison. Intermediate Risk Study.  Left heart cath and coronary angiography 06/27/2021:  LM: Normal LAD: Large vessel         Mid LAD focal 40% stenosis (RFR 0.94)         Diag 1 ostial 70% stenosis         Diag 2 ostial-mid diffuse 90% stenosis Lcx: Small OM1 vessel, focal 90% stenosis (RFR 0.96)         Mid Lcx focal 50% stenoses RCA: Focal mid 50% stenosis LVEDP normal   Nonobstructive CAD in LAD and RCA Unable to perform OM RFR evaluation due to severe angulated Lcx takeoff Severe disease in medium sized diag 2. This alone does not explain his cardiomyopathy.  There is no obstructive disease in basal inferior wall, which is usually supplied by RCA.   He doe snot have any angina symptoms at this time. Dyspnea is mild. 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 I do not  think this will change with diag revascularization at this time.    Recommend continued management of HFrEF. Change lisinopril 20 mg to Entresto 49-51 mg bid. If EF remains low in spite of optimal GDMT for heart failure, and if dyspnea persists, could then consider diag revascularization for possible angina equivalent dyspnea.   EKG:   02/24/2021: Normal sinus rhythm at rate of 97 bpm, biatrial enlargement, baseline artifact, nonspecific T abnormality..  Assessment     ICD-10-CM   1. Hypertensive heart disease without heart failure  99991111 Basic metabolic panel    2. Essential hypertension  I10       Medications  Discontinued During This Encounter  Medication Reason   cloNIDine (CATAPRES - DOSED IN MG/24 HR) 0.2 mg/24hr patch Discontinued by provider     Meds ordered this encounter  Medications   aspirin EC 81 MG tablet    Sig: Take 1 tablet (81 mg total) by mouth daily. Swallow whole.    Dispense:  90 tablet    Refill:  3   spironolactone (ALDACTONE) 25 MG tablet    Sig: Take 0.5 tablets (12.5 mg total) by mouth daily.    Dispense:  45 tablet    Refill:  3   Recommendations:   DAEVION STEFFAN is a 61 y.o. AA male with history of seizure disorder, hypertension, prediabetes, alcohol abuse, tobacco use.    Patient was admitted 03/12/2019 - 03/16/2021 with acute encephalopathy at which time echo suggested cardiomyopathy and mild inferior wall motion abnormality. Patient subsequently underwent nuclear stress test which showed small moderate intensity fixed perfusion defect suggestive of prior inferior infarct.  Given abnormal echocardiogram and stress test patient subsequently underwent left heart catheterization. He now presents for follow up.  Reviewed and discussed results of left heart catheterization, details above.  We will start patient on aspirin 81 mg daily.  Patient's blood pressure is well controlled although he has not been consistently using clonidine patch.  Given underlying cardiomyopathy we will stop clonidine and switch patient to spironolactone 12.5 mg p.o. daily with repeat BMP in 1 week.  Continue guideline directed medical therapy including hydralazine, isosorbide dinitrate, metoprolol, Entresto.  We will also continue amlodipine as well as statin therapy.   Will plan to uptitrate GDMT as hemodynamics and renal function allow.   Follow up in 6 weeks, sooner if needed.    Alethia Berthold, PA-C 07/11/2021, 11:55 AM Office: [phone]

## 2021-07-17 DIAGNOSIS — Z87898 Personal history of other specified conditions: Secondary | ICD-10-CM | POA: Diagnosis not present

## 2021-07-18 ENCOUNTER — Other Ambulatory Visit: Payer: Self-pay

## 2021-07-20 ENCOUNTER — Other Ambulatory Visit: Payer: Self-pay

## 2021-07-21 ENCOUNTER — Other Ambulatory Visit: Payer: Self-pay

## 2021-07-21 ENCOUNTER — Ambulatory Visit: Payer: Medicaid Other

## 2021-07-21 ENCOUNTER — Other Ambulatory Visit: Payer: Self-pay | Admitting: Internal Medicine

## 2021-07-21 DIAGNOSIS — R569 Unspecified convulsions: Secondary | ICD-10-CM

## 2021-07-21 DIAGNOSIS — Z8673 Personal history of transient ischemic attack (TIA), and cerebral infarction without residual deficits: Secondary | ICD-10-CM

## 2021-07-21 MED ORDER — LACOSAMIDE 200 MG PO TABS
200.0000 mg | ORAL_TABLET | Freq: Two times a day (BID) | ORAL | 2 refills | Status: DC
  Filled 2021-08-22: qty 120, 60d supply, fill #0

## 2021-07-21 NOTE — Chronic Care Management (AMB) (Addendum)
Care Management    RN Visit Note  07/24/2021 Name: Arthur [name] MRN: [id number] DOB: [birth year]  Subjective: Arthur [name] is a 61 y.o. year old male who is a primary care patient of Arthur Manes, MD. The care management team was consulted for assistance with disease management and care coordination needs.    Engaged with patient by telephone for follow up visit in response to provider referral for case management and/or care coordination services.   Consent to Services:   Arthur [name] was given information about Care Management services today including:  Care Management services includes personalized support from designated clinical staff supervised by his physician, including individualized plan of care and coordination with other care providers 24/7 contact phone numbers for assistance for urgent and routine care needs. The patient may stop case management services at any time by phone call to the office staff.  Patient agreed to services and consent obtained.   Assessment: Review of patient past medical history, allergies, medications, health status, including review of consultants reports, laboratory and other test data, was performed as part of comprehensive evaluation and provision of chronic care management services.   SDOH (Social Determinants of Health) assessments and interventions performed:    Care Plan  Allergies  Allergen Reactions   Penicillins Hives    Did it involve swelling of the face/tongue/throat, SOB, or low BP? Y Did it involve sudden or severe rash/hives, skin peeling, or any reaction on the inside of your mouth or nose? N Did you need to seek medical attention at a hospital or doctor's office? Y When did it last happen?  Over 5 Years Ago     If all above answers are NO, may proceed with cephalosporin use.     Outpatient Encounter Medications as of 07/21/2021  Medication Sig   acetaminophen (TYLENOL) 325 MG tablet Take 2 tablets (650 mg total) by mouth  every 6 (six) hours as needed for mild pain (or Fever >/= 101).   amLODipine (NORVASC) 10 MG tablet Take 1 tablet (10 mg total) by mouth daily.   aspirin EC 81 MG tablet Take 1 tablet (81 mg total) by mouth daily. Swallow whole.   atorvastatin (LIPITOR) 40 MG tablet Take 1 tablet (40 mg total) by mouth daily.   clonazePAM (KLONOPIN) 1 MG tablet Take 1 mg by mouth 2 (two) times daily.   hydrALAZINE (APRESOLINE) 50 MG tablet Take 1 tablet (50 mg total) by mouth every 8 (eight) hours.   HYDROcodone-acetaminophen (NORCO/VICODIN) 5-325 MG tablet Take 1 tablet by mouth every 4 (four) hours as needed for moderate pain.   isosorbide dinitrate (ISORDIL) 30 MG tablet Take 1 tablet (30 mg total) by mouth 3 (three) times daily.   levETIRAcetam (KEPPRA) 750 MG tablet Take 1,500 mg by mouth 2 (two) times daily.   metFORMIN (GLUCOPHAGE) 500 MG tablet Take 1 tablet (500 mg total) by mouth 2 (two) times daily with a meal. IM PROGRAM   methocarbamol (ROBAXIN) 500 MG tablet Take 1 tablet (500 mg total) by mouth every 6 (six) hours as needed for muscle spasms.   metoprolol tartrate (LOPRESSOR) 50 MG tablet Take 1 tablet (50 mg total) by mouth 2 (two) times daily.   Multiple Vitamin (MULTIVITAMIN WITH MINERALS) TABS tablet Take 1 tablet by mouth daily.   perampanel (FYCOMPA) 2 MG tablet Take 1 tablet (2 mg total) by mouth at bedtime.   phenytoin (DILANTIN) 50 MG tablet Chew 1 tablet (50 mg total) by mouth daily.  sacubitril-valsartan (ENTRESTO) 49-51 MG Take 1 tablet by mouth 2 (two) times daily. Start on 06/29/2021   senna (SENOKOT) 8.6 MG TABS tablet Take 2 tablets (17.2 mg total) by mouth daily.   spironolactone (ALDACTONE) 25 MG tablet Take 0.5 tablets (12.5 mg total) by mouth daily.   [DISCONTINUED] lacosamide (VIMPAT) 200 MG TABS tablet Take 1 tablet (200 mg total) by mouth 2 (two) times daily.   No facility-administered encounter medications on file as of 07/21/2021.    Patient Active Problem List    Diagnosis Date Noted   Coronary artery disease    HFrEF (heart failure with reduced ejection fraction) ([place]) 05/10/2021   Controlled type 2 diabetes mellitus with hyperglycemia, without long-term current use of insulin ([place])    Encephalopathy acute 03/11/2021   Seizures ([place]) 02/24/2021   Aortic atherosclerosis ([place]) 11/22/2020   History of seizures 11/22/2020   Alcohol use disorder, severe, dependence ([place]) 11/22/2020   Type 2 diabetes mellitus ([place]) 11/22/2020   Anemia 11/22/2020   Hyperlipidemia 11/22/2020   Acute encephalopathy 10/16/2020   Transaminitis    Essential hypertension 10/08/2019   History of CVA (cerebrovascular accident) 10/02/2019    Conditions to be addressed/monitored: HTN and Seizure disorder  There are no care plans that you recently modified to display for this patient.   Plan: The patient has been provided with contact information for the care management team and has been advised to call with any health related questions or concerns.  Arthur Killian, RN, BSN, CCM Care Management Coordinator [facility] Phone: [phone]: [phone]

## 2021-07-21 NOTE — Chronic Care Management (AMB) (Incomplete Revision)
° Care Management °  ° RN Visit Note ° °07/24/2021 °Name: Reyn A Tolbert MRN: [id number] DOB: 09/04/1960 ° °Subjective: °Yaron A Fierro is a 61 y.o. year old male who is a primary care patient of Patel, Monik, MD. The care management team was consulted for assistance with disease management and care coordination needs.   ° °Engaged with patient by telephone for follow up visit in response to provider referral for case management and/or care coordination services.  ° °Consent to Services:  ° Mr. Kennebrew was given information about Care Management services today including:  °Care Management services includes personalized support from designated clinical staff supervised by his physician, including individualized plan of care and coordination with other care providers °24/7 contact phone numbers for assistance for urgent and routine care needs. °The patient may stop case management services at any time by phone call to the office staff. ° °Patient agreed to services and consent obtained.  ° °Assessment: Review of patient past medical history, allergies, medications, health status, including review of consultants reports, laboratory and other test data, was performed as part of comprehensive evaluation and provision of chronic care management services.  ° °SDOH (Social Determinants of Health) assessments and interventions performed:   ° °Care Plan ° °Allergies  °Allergen Reactions  ° Penicillins Hives  °  Did it involve swelling of the face/tongue/throat, SOB, or low BP? Y °Did it involve sudden or severe rash/hives, skin peeling, or any reaction on the inside of your mouth or nose? N °Did you need to seek medical attention at a hospital or doctor's office? Y °When did it last happen?  Over 5 Years Ago     °If all above answers are “NO”, may proceed with cephalosporin use. °  ° ° °Outpatient Encounter Medications as of 07/21/2021  °Medication Sig  ° acetaminophen (TYLENOL) 325 MG tablet Take 2 tablets (650 mg total) by mouth  every 6 (six) hours as needed for mild pain (or Fever >/= 101).  ° amLODipine (NORVASC) 10 MG tablet Take 1 tablet (10 mg total) by mouth daily.  ° aspirin EC 81 MG tablet Take 1 tablet (81 mg total) by mouth daily. Swallow whole.  ° atorvastatin (LIPITOR) 40 MG tablet Take 1 tablet (40 mg total) by mouth daily.  ° clonazePAM (KLONOPIN) 1 MG tablet Take 1 mg by mouth 2 (two) times daily.  ° hydrALAZINE (APRESOLINE) 50 MG tablet Take 1 tablet (50 mg total) by mouth every 8 (eight) hours.  ° HYDROcodone-acetaminophen (NORCO/VICODIN) 5-325 MG tablet Take 1 tablet by mouth every 4 (four) hours as needed for moderate pain.  ° isosorbide dinitrate (ISORDIL) 30 MG tablet Take 1 tablet (30 mg total) by mouth 3 (three) times daily.  ° levETIRAcetam (KEPPRA) 750 MG tablet Take 1,500 mg by mouth 2 (two) times daily.  ° metFORMIN (GLUCOPHAGE) 500 MG tablet Take 1 tablet (500 mg total) by mouth 2 (two) times daily with a meal. IM PROGRAM  ° methocarbamol (ROBAXIN) 500 MG tablet Take 1 tablet (500 mg total) by mouth every 6 (six) hours as needed for muscle spasms.  ° metoprolol tartrate (LOPRESSOR) 50 MG tablet Take 1 tablet (50 mg total) by mouth 2 (two) times daily.  ° Multiple Vitamin (MULTIVITAMIN WITH MINERALS) TABS tablet Take 1 tablet by mouth daily.  ° perampanel (FYCOMPA) 2 MG tablet Take 1 tablet (2 mg total) by mouth at bedtime.  ° phenytoin (DILANTIN) 50 MG tablet Chew 1 tablet (50 mg total) by mouth daily.  °   sacubitril-valsartan (ENTRESTO) 49-51 MG Take 1 tablet by mouth 2 (two) times daily. Start on 06/29/2021  ° senna (SENOKOT) 8.6 MG TABS tablet Take 2 tablets (17.2 mg total) by mouth daily.  ° spironolactone (ALDACTONE) 25 MG tablet Take 0.5 tablets (12.5 mg total) by mouth daily.  ° [DISCONTINUED] lacosamide (VIMPAT) 200 MG TABS tablet Take 1 tablet (200 mg total) by mouth 2 (two) times daily.  ° °No facility-administered encounter medications on file as of 07/21/2021.  ° ° °Patient Active Problem List  °  Diagnosis Date Noted  ° Coronary artery disease   ° HFrEF (heart failure with reduced ejection fraction) (HCC) 05/10/2021  ° Controlled type 2 diabetes mellitus with hyperglycemia, without long-term current use of insulin (HCC)   ° Encephalopathy acute 03/11/2021  ° Seizures (HCC) 02/24/2021  ° Aortic atherosclerosis (HCC) 11/22/2020  ° History of seizures 11/22/2020  ° Alcohol use disorder, severe, dependence (HCC) 11/22/2020  ° Type 2 diabetes mellitus (HCC) 11/22/2020  ° Anemia 11/22/2020  ° Hyperlipidemia 11/22/2020  ° Acute encephalopathy 10/16/2020  ° Transaminitis   ° Essential hypertension 10/08/2019  ° History of CVA (cerebrovascular accident) 10/02/2019  ° ° °Conditions to be addressed/monitored: HTN and Seizure disorder ° °There are no care plans that you recently modified to display for this patient. ° ° °Plan: The patient has been provided with contact information for the care management team and has been advised to call with any health related questions or concerns.  °Deyon Chizek, RN, BSN, CCM °Care Management Coordinator °[facility] °Phone: [phone]/Fax: [phone]  ° ° ° ° ° ° ° ° ° °

## 2021-07-21 NOTE — Patient Instructions (Signed)
Visit Information  Thank you for taking time to visit with me today. Please don't hesitate to contact me if I can be of assistance to you before our next scheduled telephone appointment.  The care guide with Managed Medicaid team will be in touch to schedule appointment with Managed Medicaid Team.   If you are experiencing a Mental Health or Behavioral Health Crisis or need someone to talk to, please call the [place] National Suicide Prevention Lifeline: (671)459-5266 or TTY: 224-865-7516 TTY (517) 715-5332) to talk to a trained counselor   The patient verbalized understanding of instructions, educational materials, and care plan provided today and declined offer to receive copy of patient instructions, educational materials, and care plan.   The patient has been provided with contact information for the care management team and has been advised to call with any health related questions or concerns.   Jodelle Gross, RN, BSN, CCM Care Management Coordinator [facility] Phone: [phone]/Fax: [phone]

## 2021-07-24 ENCOUNTER — Other Ambulatory Visit: Payer: Self-pay

## 2021-08-08 ENCOUNTER — Other Ambulatory Visit: Payer: Self-pay

## 2021-08-08 NOTE — Patient Outreach (Cosign Needed)
?Medicaid Managed Care   ?Nurse Care Manager Note ? ?08/08/2021 ?Name:  Arthur [name] MRN:  [id number] DOB:  1961-05-25 ? ?Arthur [name] is an 61 y.o. year old male who is [name] primary patient of Arthur Manes, [name].  The [facility] Managed Care Coordination team was consulted for assistance with:    ?CAD ?HTN ?DMII ?Seizures ? ?Arthur [name] was given information about Medicaid Managed Care Coordination team services today. Arthur [name] Patient agreed to services and verbal consent obtained. ? ?Engaged with patient by telephone for initial visit in response to provider referral for case management and/or care coordination services.  ? ?Assessments/Interventions:  Review of past medical history, allergies, medications, health status, including review of consultants reports, laboratory and other test data, was performed as part of comprehensive evaluation and provision of chronic care management services. ? ?SDOH (Social Determinants of Health) assessments and interventions performed: ?SDOH Interventions   ? ?Flowsheet Row Most Recent Value  ?SDOH Interventions   ?Food Insecurity Interventions Other (Comment)  [[place]  ?Financial Strain Interventions Other (Comment)  [Referral to [facility] BSW and Pharmacist]  ?Housing Interventions Other (Comment)  [Referral to BSW for housing resources]  ?Physical Activity Interventions Other (Comments)  [Cannot exercise at this time.]  ?Stress Interventions Intervention Not Indicated  ?Social Connections Interventions Intervention Not Indicated  ?Transportation Interventions Intervention Not Indicated  ? ?  ? ? ?Care Plan ? ?Allergies  ?Allergen Reactions  ? Penicillins Hives  ?  Did it involve swelling of the face/tongue/throat, SOB, or low BP? Y ?Did it involve sudden or severe rash/hives, skin peeling, or any reaction on the inside of your mouth or nose? N ?Did you need to seek medical attention at [name] hospital or doctor's office? Y ?When did it last happen?  Over 5 Years Ago     ?If  all above answers are ?NO?, may proceed with cephalosporin use. ?  ? ? ?Medications Reviewed Today   ? ? Reviewed by Arthur Rise, RN (Case Manager) on 08/08/21 at 1207  Med List Status: <None>  ? ?Medication Order Taking? Sig Documenting Provider Last Dose Status Informant  ?acetaminophen (TYLENOL) 325 MG tablet [id number] No Take 2 tablets (650 mg total) by mouth every 6 (six) hours as needed for mild pain (or Fever >/= 101).  ?Patient not taking: Reported on 08/08/2021  ? Arthur Parsons, PA-C Not Taking Active Spouse/Significant Other  ?amLODipine (NORVASC) 10 MG tablet [id number] Yes Take 1 tablet (10 mg total) by mouth daily. [name], Arthur C, PA-C Taking Active Spouse/Significant Other  ?aspirin EC 81 MG tablet [id number] No Take 1 tablet (81 mg total) by mouth daily. Swallow whole.  ?Patient not taking: Reported on 08/08/2021  ? Alethia Berthold, PA-C Not Taking Active   ?atorvastatin (LIPITOR) 40 MG tablet [id number] Yes Take 1 tablet (40 mg total) by mouth daily. [name], Arthur C, PA-C Taking Active Spouse/Significant Other  ?clonazePAM (KLONOPIN) 1 MG tablet [id number] No Take 1 mg by mouth 2 (two) times daily.  ?Patient not taking: Reported on 08/08/2021  ? Arthur [name] Not Taking Active Spouse/Significant Other  ?hydrALAZINE (APRESOLINE) 50 MG tablet [id number] Yes Take 1 tablet (50 mg total) by mouth every 8 (eight) hours. [name], Arthur Legacy, PA-C Taking Active Spouse/Significant Other  ?HYDROcodone-acetaminophen (NORCO/VICODIN) 5-325 MG tablet [id number] No Take 1 tablet by mouth every 4 (four) hours as needed for moderate pain.  ?Patient not taking: Reported on 08/08/2021  ? Arthur Parsons, PA-C Not Taking  Active Spouse/Significant Other  ?isosorbide dinitrate (ISORDIL) 30 MG tablet [id number] Yes Take 1 tablet (30 mg total) by mouth 3 (three) times daily. [name], Arthur C, PA-C Taking Active Spouse/Significant Other  ?lacosamide (VIMPAT) 200 MG TABS tablet [id number] Yes  Take 1 tablet (200 mg total) by mouth 2 (two) times daily. Arthur Clan, DO Taking Active   ?levETIRAcetam (KEPPRA) 750 MG tablet [id number] Yes Take 1,500 mg by mouth 2 (two) times daily. Arthur [name] Taking Active Spouse/Significant Other  ?metFORMIN (GLUCOPHAGE) 500 MG tablet [id number] Yes Take 1 tablet (500 mg total) by mouth 2 (two) times daily with [name] meal. Arthur PROGRAM Angiulli, Lavon Paganini, PA-C Taking Active Spouse/Significant Other  ?         ?Med Note [facility], Arthur [name]   Tue Aug 08, 2021 12:00 PM) Not taking currently.  Needs refill  ?methocarbamol (ROBAXIN) 500 MG tablet [id number] No Take 1 tablet (500 mg total) by mouth every 6 (six) hours as needed for muscle spasms.  ?Patient not taking: Reported on 08/08/2021  ? Arthur Parsons, PA-C Not Taking Active Spouse/Significant Other  ?metoprolol tartrate (LOPRESSOR) 50 MG tablet [id number] Yes Take 1 tablet (50 mg total) by mouth 2 (two) times daily. [name], Arthur Legacy, PA-C Taking Active Spouse/Significant Other  ?Arthur [name] No Take 1 tablet by mouth daily.  ?Patient not taking: Reported on 08/08/2021  ? Arthur Hansen, [name] Not Taking Active Spouse/Significant Other  ?perampanel (FYCOMPA) 2 MG tablet [id number] No Take 1 tablet (2 mg total) by mouth at bedtime.  ?Patient not taking: Reported on 08/08/2021  ? Arthur Parsons, PA-C Not Taking Active Spouse/Significant Other  ?phenytoin (DILANTIN) 50 MG tablet [id number] No Chew 1 tablet (50 mg total) by mouth daily.  ?Patient not taking: Reported on 08/08/2021  ? Arthur Parsons, PA-C Not Taking Active Spouse/Significant Other  ?sacubitril-valsartan (ENTRESTO) 49-51 MG [id number]  Take 1 tablet by mouth 2 (two) times daily. Start on 06/29/2021 Arthur Mormon, [name]  Active   ?         ?Med Note [facility], Arthur [name]   Tue Aug 08, 2021 12:03 PM) Does not have medication currently  ?senna (SENOKOT) 8.6 MG TABS tablet [id number] No Take 2  tablets (17.2 mg total) by mouth daily.  ?Patient not taking: Reported on 08/08/2021  ? Arthur Manes, [name] Not Taking Active Spouse/Significant Other  ?spironolactone (ALDACTONE) 25 MG tablet [id number] No Take 0.5 tablets (12.5 mg total) by mouth daily.  ?Patient not taking: Reported on 08/08/2021  ? Alethia Berthold, PA-C Not Taking Active   ?Med List Note [name], CPhT 02/24/21 1756): Vimpat: "12/08/20 receiving PAP through [employer], approved until 12/08/2022"  ? ?  ?  ? ?  ? ? ?Patient Active Problem List  ? Diagnosis Date Noted  ? Coronary artery disease   ? HFrEF (heart failure with reduced ejection fraction) ([place]) 05/10/2021  ? Controlled type 2 diabetes mellitus with hyperglycemia, without long-term current use of insulin ([place])   ? Encephalopathy acute 03/11/2021  ? Seizures ([place]) 02/24/2021  ? Aortic atherosclerosis ([place]) 11/22/2020  ? History of seizures 11/22/2020  ? Alcohol use disorder, severe, dependence ([place]) 11/22/2020  ? Type 2 diabetes mellitus ([place]) 11/22/2020  ? Anemia 11/22/2020  ? Hyperlipidemia 11/22/2020  ? Acute encephalopathy 10/16/2020  ? Transaminitis   ? Essential hypertension 10/08/2019  ? History of CVA (cerebrovascular accident) 10/02/2019  ? ? ?Conditions to be addressed/monitored per PCP order:  CAD, HTN, DMII, and Seizures ? ?Care Plan : Hypertension (Adult)  ?Updates made by Arthur Rise, RN since 08/08/2021 12:00 AM  ?  ? ?Problem: Disease Progression (Hypertension)   ?Priority: High  ?Onset Date: 05/24/2020  ?  ? ?Goal: Disease Progression Prevented or Minimized   ?Start Date: 05/24/2020  ?Recent Progress: On track  ?Priority: High  ?Note:   ?Current Barriers: Successful outreach to patient and his spouse, [name].  Discussed family having to move because their apartment needed repairs and did not pass Section 8 inspection.  They are currently living in [name] hotel while Section 8 is inspecting [name]  new apartment.  Patient had [name] LEFT HEART CATH AND CORONARY ANGIOGRAPHY on  January 31,2021 and he has not been back to his neurologist to date as recommended by Dr. Posey Pronto to manage his seizure medications.  Patient is in need of refill for Vimpat.  Message sent to IMP blue team

## 2021-08-08 NOTE — Patient Instructions (Signed)
Visit Information ? ?Arthur [name] was given information about Medicaid Managed Care team care coordination services as a part of their [place] Medicaid benefit. Arthur [name] verbally consented to engagement with the [facility] Managed Care team.  ? ?If you are experiencing a medical emergency, please call 911 or report to your local emergency department or urgent care.  ? ?If you have a non-emergency medical problem during routine business hours, please contact your provider's office and ask to speak with a nurse.  ? ?For questions related to your [facility], please call: [phone] or visit the homepage here: [email or web address] ? ?If you would like to schedule transportation through your [facility], please call the following number at least 2 days in advance of your appointment: [phone]. ? Rides for urgent appointments can also be made after hours by calling Member Services. ? ?Call the [place] at [phone], at any time, 24 hours a day, 7 days a week. If you are in danger or need immediate medical attention call 911. ? ?If you would like help to quit smoking, call 1-800-QUIT-NOW (380)587-0462) OR Espa?ol: 1-855-D?jelo-Ya 564-767-9468) o para m?s informaci?n haga clic aqu? or Text READY to 200-400 to register via text ? ?Arthur [name] - following are the goals we discussed in your visit today: Please see Patient Goals in the [place] of Care below. ? ?Please see education materials related to today's visit provided as print materials.  ? ?The patient verbalized understanding of instructions, educational materials, and care plan provided today and agreed to receive a mailed copy of patient instructions, educational materials, and care plan.  ? ?The Managed Medicaid care management team will reach out to the patient again over the next 30 days.   ? ?Arthur Marvel RN, BSN ?Community Care Coordinator ?[facility] ?Mobile: [phone]  ? ?Following is a copy of your plan of care:  ?Care Plan : Hypertension (Adult)  ?Updates made by [name], RN since 08/08/2021 12:00 AM  ?  ? ?Problem: Disease Progression (Hypertension)   ?Priority: High  ?Onset Date: 05/24/2020  ?  ? ?Goal: Disease Progression Prevented or Minimized   ?Start Date: 05/24/2020  ?Recent Progress: On track  ?Priority: High  ?Note:   ?Current Barriers: Successful outreach to patient and his spouse, Arthur [name].  Discussed family having to move because their apartment needed repairs and did not pass Section 8 inspection.  They are currently living in a hotel while Section 8 is inspecting a  new apartment.  Patient had a LEFT HEART CATH AND CORONARY ANGIOGRAPHY on January 31,2021 and he has not been back to his neurologist to date as recommended by Dr. Posey Pronto to manage his seizure medications.  Patient is in need of refill for Vimpat.  Message sent to IMP blue team.   ?Patient spouse also requested referral for outpatient speech therapy for patient.  We had discussed in December and patient was scheduled to come into the clinic and they would discuss with physician.  Since request not completed, included request with refill request. ?Discussed with patient and his spouse that he now has Managed Medicaid and he would transition over to the [facility] Team and they will receive a call from Reita Chard to schedule appointment with new [facility] Care Coordinator.  This RNCM will continue following patient until the medication request/ST request responded to. ?Addendum; Referral for outpatient therapy sent to [facility],  IMP referral coordinator by Dr. Jimmye Norman and Vimpat was refilled. ?Chronic Disease Management support and education needs related to HTN, DMII, and Seizure disorder ?Film/video editor.  ?Difficulty obtaining medications ?RNCM  Clinical Goal(s):  ?Patient will verbalize understanding of plan for management of HTN, DMII, and Seizure disorder as evidenced by interactive conversation with RNCM. ?continue to work with Consulting civil engineer and/or Social Worker to address care management and care coordination needs related to HTN, DMII, and Seizure disorder as evidenced by adherence to CM Team Scheduled appointments     ?experience decrease in ED visits as evidenced by EMR review; ED visits in last 6 months = 2  through collaboration with RN Care manager, provider, and care team.  ?Interventions: ?1:1 collaboration with primary care provider regarding development and update of comprehensive plan of care as evidenced by provider attestation and co-signature ?Inter-disciplinary care team collaboration (see longitudinal plan of care) ?Evaluation of current treatment plan related to  self management and patient's adherence to plan as established by provider ?Patient Goals/Self-Care Activities: ?Take medications as prescribed   ?Attend all scheduled provider appointments ?Call pharmacy for medication refills 3-7 days in advance of running out of medications ?Perform IADL's (shopping, preparing meals, housekeeping, managing finances) independently ?Call provider office for new concerns or questions  ?Work with the Education officer, museum to address care coordination needs and will continue to work with the clinical team to address health care and disease management related needs ?check feet daily for cuts, sores or redness ?take the blood sugar meter to all doctor visits ?drink 6 to 8 glasses of water each day ?read food labels for fat, fiber, carbohydrates and portion size ?check blood pressure 3 times per week ?write blood pressure results in a log or diary ?keep a blood pressure log ?take blood pressure log to all doctor appointments ?keep all doctor appointments ?take medications for blood pressure exactly as prescribed ? ?Last practice recorded BP readings:   ?BP Readings from Last 3 Encounters:  ?07/11/21 128/74  ?06/27/21 (!) 147/103  ?05/31/21 120/73  ? Most recent eGFR/CrCl:  ?Lab Results  ?Component Value Date  ? EGFR 92 06/06/2021  ?  No components found for: CRCL ?Follow Up Plan: The care management team will reach out to the patient again over the next 30-60 days.   ?  ? ?Care Plan : RN Care Manager Plan of Care  ?Updates made by [name], RN since 08/08/2021 12:00 AM  ?  ? ?Problem: Chronic Disease Management and Care Coordiantion Needs for DMII, HTN, CAD, Seizures   ?Priority: High  ?  ? ?Long-Range Goal: Development of Plan of Care for Chronic Disease Management and Care Coordination Needs (CAD, HTN, DMII, Seizures)   ?Start Date: 08/08/2021  ?Expected End Date: 12/06/2021  ?Priority: High  ?Note:   ?Current Barriers:  ?Knowledge Deficits related to plan of care for management of CAD, HTN, DMII, and Seizures  ?Care Coordination needs related to Financial constraints related to affordable safe housing and utility payment, Housing barriers, Medication procurement, ADL IADL limitations, Literacy concerns, Cognitive Deficits, Memory Deficits, Inability to perform IADL's independently, and Lacks knowledge of community resource: safe & affordable Section 8 Housing, utility payment assistance and dental care providers covered by [name].  ?Chronic Disease Management support and education needs related to CAD, HTN, DMII, and Seizures ?Film/video editor.  ?Non-adherence to prescribed medication regimen ?Difficulty obtaining medications ?Cognitive Deficits ?No Advanced Directives in place ?Falls - Increased Potential for Falls ? ?RNCM Clinical Goal(s):  ?  Patient will verbalize understanding of plan for management of CAD, HTN, DMII, and Seizures as evidenced by improved management of these chronic diseases. ?verbalize basic understanding of CAD, HTN, DMII, and Seizures disease process and self health management plan as evidenced by noted  improvement of management of these chronic diseases. ?take all medications exactly as prescribed and will call provider for medication related questions as evidenced by being compliant with all medications    ?attend

## 2021-08-11 ENCOUNTER — Ambulatory Visit: Payer: Medicaid Other | Admitting: Speech Pathology

## 2021-08-16 ENCOUNTER — Other Ambulatory Visit: Payer: Self-pay

## 2021-08-16 NOTE — Patient Outreach (Signed)
?Medicaid Managed Care ?Social Work Note ? ?08/16/2021 ?Name:  Arthur [name]:  [id number] DOB:  June 28, 1960 ? ?Arthur [name] is an 61 y.o. year old male who is a primary Arthur [name] of [name], [place].  The Medicaid Managed Care Coordination team was consulted for assistance with:  Community Resources  ? ?Arthur [name] was given information about Medicaid Managed Care Coordination team services today. [name] Arthur [name] agreed to services and verbal consent obtained. ? ?Engaged with Arthur [name]  for by telephone forinitial visit in response to referral for case management and/or care coordination services.  ? ?Assessments/Interventions:  Review of past medical history, allergies, medications, health status, including review of consultants reports, laboratory and other test data, was performed as part of comprehensive evaluation and provision of chronic care management services. ? ?SDOH: (Social Determinant of Health) assessments and interventions performed: ?BSW completed telephone outreach with Arthur [name] and his wife. They stated they are currently in a hotel but are waiting on paperwork and inspections to be completed on the new house. They will be in the hotel until 08/24/21, but do not know when the new house will be ready to move in. Arthur [name] does not have any income right. They are needing assistance with paying the last utility bill from their previous home. BSW informed they can go to DSS and apply for the utility program, however since the services are disconnected due to moving, they may not assist. They provided an email address for the dental resources to be emailed to [email or web address]. No other resources are needed at this time.  ? ? ?Advanced Directives Status:  Not addressed in this encounter. ? ?Care Plan ?                ?Allergies  ?Allergen Reactions  ? Penicillins Hives  ?  Did it involve swelling of the face/tongue/throat, SOB, or low BP? Y ?Did it involve sudden or severe rash/hives, skin peeling, or any  reaction on the inside of your mouth or nose? N ?Did you need to seek medical attention at a hospital or doctor's office? Y ?When did it last happen?  Over 5 Years Ago     ?If all above answers are ?NO?, may proceed with cephalosporin use. ?  ? ? ?Medications Reviewed Today   ? ? Reviewed by Leane Call, RN (Case Manager) on 08/08/21 at 1207  Med List Status: <None>  ? ?Medication Order Taking? Sig Documenting Provider Last Dose Status Informant  ?acetaminophen (TYLENOL) 325 MG tablet [id number] No Take 2 tablets (650 mg total) by mouth every 6 (six) hours as needed for mild pain (or Fever >/= 101).  ?Arthur [name] not taking: Reported on 08/08/2021  ? [name], PA-C Not Taking Active Spouse/Significant Other  ?amLODipine (NORVASC) 10 MG tablet [id number] Yes Take 1 tablet (10 mg total) by mouth daily. [name], [name], PA-C Taking Active Spouse/Significant Other  ?aspirin EC 81 MG tablet [id number] No Take 1 tablet (81 mg total) by mouth daily. Swallow whole.  ?Arthur [name] not taking: Reported on 08/08/2021  ? [name], PA-C Not Taking Active   ?atorvastatin (LIPITOR) 40 MG tablet [id number] Yes Take 1 tablet (40 mg total) by mouth daily. [name], [name], PA-C Taking Active Spouse/Significant Other  ?clonazePAM (KLONOPIN) 1 MG tablet [id number] No Take 1 mg by mouth 2 (two) times daily.  ?Arthur [name] not taking: Reported on 08/08/2021  ? [provider] Not Taking Active Spouse/Significant Other  ?hydrALAZINE (APRESOLINE) 50 MG tablet [id number] Yes  Take 1 tablet (50 mg total) by mouth every 8 (eight) hours. [name], [name], PA-C Taking Active Spouse/Significant Other  ?HYDROcodone-acetaminophen (NORCO/VICODIN) 5-325 MG tablet [id number] No Take 1 tablet by mouth every 4 (four) hours as needed for moderate pain.  ?Arthur [name] not taking: Reported on 08/08/2021  ? [name], PA-C Not Taking Active Spouse/Significant Other  ?isosorbide dinitrate (ISORDIL) 30 MG tablet [id number] Yes Take  1 tablet (30 mg total) by mouth 3 (three) times daily. [name], [name], PA-C Taking Active Spouse/Significant Other  ?lacosamide (VIMPAT) 200 MG TABS tablet [id number] Yes Take 1 tablet (200 mg total) by mouth 2 (two) times daily. [name], DO Taking Active   ?levETIRAcetam (KEPPRA) 750 MG tablet [id number] Yes Take 1,500 mg by mouth 2 (two) times daily. [provider] Taking Active Spouse/Significant Other  ?metFORMIN (GLUCOPHAGE) 500 MG tablet [id number] Yes Take 1 tablet (500 mg total) by mouth 2 (two) times daily with a meal. IM PROGRAM Angiulli, [name], PA-C Taking Active Spouse/Significant Other  ?         ?Med Note [facility], MAUREEN A   Tue Aug 08, 2021 12:00 PM) Not taking currently.  Needs refill  ?methocarbamol (ROBAXIN) 500 MG tablet [id number] No Take 1 tablet (500 mg total) by mouth every 6 (six) hours as needed for muscle spasms.  ?Arthur [name] not taking: Reported on 08/08/2021  ? [name], PA-C Not Taking Active Spouse/Significant Other  ?metoprolol tartrate (LOPRESSOR) 50 MG tablet [id number] Yes Take 1 tablet (50 mg total) by mouth 2 (two) times daily. [name], [name], PA-C Taking Active Spouse/Significant Other  ?Multiple Vitamin (MULTIVITAMIN WITH MINERALS) TABS tablet [id number] No Take 1 tablet by mouth daily.  ?Arthur [name] not taking: Reported on 08/08/2021  ? [name], MD Not Taking Active Spouse/Significant Other  ?perampanel (FYCOMPA) 2 MG tablet [id number] No Take 1 tablet (2 mg total) by mouth at bedtime.  ?Arthur [name] not taking: Reported on 08/08/2021  ? [name], PA-C Not Taking Active Spouse/Significant Other  ?phenytoin (DILANTIN) 50 MG tablet [id number] No Chew 1 tablet (50 mg total) by mouth daily.  ?Arthur [name] not taking: Reported on 08/08/2021  ? [name], PA-C Not Taking Active Spouse/Significant Other  ?sacubitril-valsartan (ENTRESTO) 49-51 MG [id number]  Take 1 tablet by mouth 2 (two) times daily. Start on 06/29/2021 Elder [name], MD   Active   ?         ?Med Note [facility], MAUREEN A   Tue Aug 08, 2021 12:03 PM) Does not have medication currently  ?senna (SENOKOT) 8.6 MG TABS tablet [id number] No Take 2 tablets (17.2 mg total) by mouth daily.  ?Arthur [name] not taking: Reported on 08/08/2021  ? [name], MD Not Taking Active Spouse/Significant Other  ?spironolactone (ALDACTONE) 25 MG tablet [id number] No Take 0.5 tablets (12.5 mg total) by mouth daily.  ?Arthur [name] not taking: Reported on 08/08/2021  ? [name], PA-C Not Taking Active   ?Med List Note [name], CPhT 02/24/21 1756): [place]: "12/08/20 receiving PAP through [employer], approved until 12/08/2022"  ? ?  ?  ? ?  ? ? ?Arthur [name] Active Problem List  ? Diagnosis Date Noted  ? Coronary artery disease   ? HFrEF (heart failure with reduced ejection fraction) (HCC) 05/10/2021  ? Controlled type 2 diabetes mellitus with hyperglycemia, without long-term current use of insulin (HCC)   ? Encephalopathy acute 03/11/2021  ? Seizures (HCC) 02/24/2021  ? Aortic atherosclerosis (HCC) 11/22/2020  ? History of seizures 11/22/2020  ?  Alcohol use disorder, severe, dependence (HCC) 11/22/2020  ? Type 2 diabetes mellitus (HCC) 11/22/2020  ? Anemia 11/22/2020  ? Hyperlipidemia 11/22/2020  ? Acute encephalopathy 10/16/2020  ? Transaminitis   ? Essential hypertension 10/08/2019  ? History of CVA (cerebrovascular accident) 10/02/2019  ? ? ?Conditions to be addressed/monitored per PCP order:   dental, utilities, and housing ? ?Care Plan : RN Care Manager Plan of Care  ?Updates made by Shaune Leeks since 08/16/2021 12:00 AM  ?  ? ?Problem: Chronic Disease Management and Care Coordiantion Needs for DMII, HTN, CAD, Seizures   ?Priority: High  ?  ? ?Long-Range Goal: Development of Plan of Care for Chronic Disease Management and Care Coordination Needs (CAD, HTN, DMII, Seizures)   ?Start Date: 08/08/2021  ?Expected End Date: 12/06/2021  ?Priority: High  ?Note:   ?Current Barriers:  ?Knowledge Deficits related  to plan of care for management of CAD, HTN, DMII, and Seizures  ?Care Coordination needs related to Financial constraints related to affordable safe housing and utility payment, Housing barriers, Me

## 2021-08-16 NOTE — Patient Instructions (Signed)
Visit Information ? ?Mr. Shong was given information about Medicaid Managed Care team care coordination services as a part of their [facility] Community Plan Medicaid benefit. [name] verbally consented to engagement with the [facility] Managed Care team.  ? ?If you are experiencing a medical emergency, please call 911 or report to your local emergency department or urgent care.  ? ?If you have a non-emergency medical problem during routine business hours, please contact your provider's office and ask to speak with a nurse.  ? ?For questions related to your [facility], please call: [phone] or visit the homepage here: [email or web address] ? ?If you would like to schedule transportation through your [facility], please call the following number at least 2 days in advance of your appointment: [phone]. ? Rides for urgent appointments can also be made after hours by calling Member Services. ? ?Call the Behavioral Health Crisis Line at [phone], at any time, 24 hours a day, 7 days a week. If you are in danger or need immediate medical attention call 911. ? ?If you would like help to quit smoking, call 1-800-QUIT-NOW ((469)468-4775) OR Espa?ol: 1-855-D?jelo-Ya (269)638-0929) o para m?s informaci?n haga clic aqu? or Text READY to 200-400 to register via text ? ?Mr. Egleston - following are the goals we discussed in your visit today:  ? Goals Addressed   ?None ?  ? ? ?Social Worker will follow up in 14 days .  ? ?Gus Puma, BSW, [place] ?[employer] consultant Health  ?High Risk Managed Medicaid Team  ?[phone]  ? ?Following is a copy of your plan of care:  ?Care Plan : RN Care Manager Plan of Care  ?Updates made by Shaune Leeks since 08/16/2021 12:00 AM  ?  ? ?Problem: Chronic Disease Management and Care Coordiantion Needs for DMII, HTN, CAD, Seizures   ?Priority: High  ?   ? ?Long-Range Goal: Development of Plan of Care for Chronic Disease Management and Care Coordination Needs (CAD, HTN, DMII, Seizures)   ?Start Date: 08/08/2021  ?Expected End Date: 12/06/2021  ?Priority: High  ?Note:   ?Current Barriers:  ?Knowledge Deficits related to plan of care for management of CAD, HTN, DMII, and Seizures  ?Care Coordination needs related to Financial constraints related to affordable safe housing and utility payment, Housing barriers, Medication procurement, ADL IADL limitations, Literacy concerns, Cognitive Deficits, Memory Deficits, Inability to perform IADL's independently, and Lacks knowledge of community resource: safe & affordable Section 8 Housing, utility payment assistance and dental care providers covered by [employer]  ?Chronic Disease Management support and education needs related to CAD, HTN, DMII, and Seizures ?Corporate treasurer.  ?Non-adherence to prescribed medication regimen ?Difficulty obtaining medications ?Cognitive Deficits ?No Advanced Directives in place ?Falls - Increased Potential for Falls ? ?RNCM Clinical Goal(s):  ?Patient will verbalize understanding of plan for management of CAD, HTN, DMII, and Seizures as evidenced by improved management of these chronic diseases. ?verbalize basic understanding of CAD, HTN, DMII, and Seizures disease process and self health management plan as evidenced by noted improvement of management of these chronic diseases. ?take all medications exactly as prescribed and will call provider for medication related questions as evidenced by being compliant with all medications    ?attend all scheduled medical appointments: 08/11/2021 Neuro ST Evaluation; 08/22/2021 Cardiologist Visit as evidenced by attending all scheduled appointments        ?demonstrate improved adherence to prescribed treatment plan for CAD, HTN, DMII, and  Seizures as evidenced by overall improved management of these chronic diseases ?continue to work  with Medical illustrator and/or Social Worker to address care management and care coordination needs related to CAD, HTN, DMII, and Seizures as evidenced by adherence to CM Team Scheduled appointments     ?work with pharmacist to address Financial constraints related to obtaining medications and Medication procurement related to CAD, HTN, DMII, and Seizures as evidenced by review of EMR and patient or pharmacist report    ?work with Child psychotherapist to address Financial constraints related to safe affordable Housing and utility payment , Housing barriers, ADL IADL limitations, Cognitive Deficits, Memory Deficits, Inability to perform IADL's independently, and Lacks knowledge of community resource: safe and affordable Section 8 Housing, utility payment assistance and dental care providers covered by [employer] insurance related to the management of CAD, HTN, DMII, and Seizures as evidenced by review of EMR and patient or Child psychotherapist report     through collaboration with Medical illustrator, provider, and care team.  ? ?Interventions: ?Inter-disciplinary care team collaboration (see longitudinal plan of care) ?Evaluation of current treatment plan related to  self management and patient's adherence to plan as established by provider ?BSW completed telephone outreach with patient and his wife. They stated they are currently in a hotel but are waiting on paperwork and inspections to be completed on the new house. They will be in the hotel until 08/24/21, but do not know when the new house will be ready to move in. Patient does not have any income right. They are needing assistance with paying the last utility bill from their previous home. BSW informed they can go to DSS and apply for the utility program, however since the services are disconnected due to moving, they may not assist. They provided an email address for the dental resources to be emailed to [email or web address]. No other resources are needed at this time.   ? ? ?CAD  (Status: New goal.) Long Term Goal  ?Assessed understanding of CAD diagnosis ?Medications reviewed including medications utilized in CAD treatment plan ?Provided education on importance of blood pressure control in management of CAD; ?Reviewed Importance of taking all medications as prescribed ?Reviewed Importance of attending all scheduled provider appointments ?Screening for signs and symptoms of depression related to chronic disease state;  ?Assessed social determinant of health barriers;  ?Patient denies any Chest Pain, edema, shortness of breath.  Patient reports occasional dizziness and lightheadedness when ambulating. ? ?Diabetes:  (Status: New goal.) Long Term Goal  ? ?Lab Results  ?Component Value Date  ? HGBA1C 6.0 (H) 02/27/2021  ?  ?Assessed patient's understanding of A1c goal: <7% ?Provided education to patient about basic DM disease process; ?Reviewed medications with patient and discussed importance of medication adherence;        ?Discussed plans with patient for ongoing care management follow up and provided patient with direct contact information for care management team;      ?Reviewed scheduled/upcoming provider appointments including: 08/11/2021 Neuro ST Evaluation; 08/22/2021 Cardiologist visit;         ?Referral made to pharmacy team for assistance with complex medication regimen;       ?Referral made to social work team for assistance with housing and utility payment assistance, dental care providers covered by Managed Medicaid;      ?Screening for signs and symptoms of depression related to chronic disease state;        ?Assessed social determinant of health barriers;        ? ?  Seizures  (Status: New goal.) Long Term Goal  ?Evaluation of current treatment plan related to  Seizures ,  and any seizure activity,  self-management and patient's adherence to plan as established by provider. ?Discussed plans with patient for ongoing care management follow up and provided patient with  direct contact information for care management team ?Advised patient to report any seizure activity to PCP; ?Reviewed medications with patient and discussed importance of medication compliance; ?Reviewed scheduled/upcomi

## 2021-08-17 DIAGNOSIS — Z87898 Personal history of other specified conditions: Secondary | ICD-10-CM | POA: Diagnosis not present

## 2021-08-18 DIAGNOSIS — Z87898 Personal history of other specified conditions: Secondary | ICD-10-CM | POA: Diagnosis not present

## 2021-08-22 ENCOUNTER — Telehealth: Payer: Self-pay | Admitting: *Deleted

## 2021-08-22 ENCOUNTER — Telehealth: Payer: Self-pay | Admitting: Pharmacist

## 2021-08-22 ENCOUNTER — Ambulatory Visit: Payer: Medicaid Other | Admitting: Student

## 2021-08-22 ENCOUNTER — Other Ambulatory Visit: Payer: Self-pay

## 2021-08-22 NOTE — Patient Instructions (Signed)
It was great to speak with you today! ? ?Visit Information ? ?Arthur [name] was given information about Medicaid Managed Care team care coordination services as a part of their [facility] Community Plan Medicaid benefit. [name] verbally consented to engagement with the [facility] Managed Care team.  ? ?If you are experiencing a medical emergency, please call 911 or report to your local emergency department or urgent care.  ? ?If you have a non-emergency medical problem during routine business hours, please contact your provider's office and ask to speak with a nurse.  ? ?For questions related to your [facility], please call: [phone] or visit the homepage here: [email or web address] ? ?If you would like to schedule transportation through your [facility], please call the following number at least 2 days in advance of your appointment: [phone]. ? Rides for urgent appointments can also be made after hours by calling Member Services. ? ? ? ?

## 2021-08-22 NOTE — Telephone Encounter (Signed)
Patient PCS form in blue team box. Patient is needing to come in for office visit. Patient's aide is to call to make his appointment due to transportation per his wife, [name]. ?

## 2021-08-22 NOTE — Patient Outreach (Signed)
? ?   ? ?Chief Complaint  ?Patient presents with  ? High Risk Managed Medicaid  ? ? ?Arthur [name] is a 61 y.o. year old male who was referred for medication management by their primary care provider, Arthur Sacramento, MD. They presented for a telephone visit in the context of the COVID-19 pandemic. ?  ?They were referred to the pharmacist by their High Risk Managed Medicaid Care Team  for assistance in managing complex medication management. Spoke with his wife, Arthur [name] (on [place]) today ? ?Subjective: ? ?Upon review of medications, Arthur [name] notes that they are currently staying at [employer] extended stay hotel, currently through 3/30, with plans to eventually move into a house when it is ready, but they are unsure when. They do not currently have transportation to the pharmacy. Mail order will not deliver to a hotel. ? ?They note the patient is out of amlodipine, and getting ready to be out of atorvastatin, isosorbide dinitrate, metoprolol succinate, hydralazine, levetiracetam, and Vimpat. No supply of aspirin.  ? ?Has not filled Entresto or spironolactone yet.  ? ?Heart Failure: ? ?Current medications:  ?- ACE/ARB/ARNI: Entresto 24/26 mg twice daily - has not started ?- Beta blocker: metoprolol succinate 50 mg twice daily ?- SGLT2: none  ?- MRA: spironolactone 12.5 mg - has not started ? ?Additional antihypertensive: amlodipine 10 mg  ? ?Seizures: ?Current medications: levetiracetam 1500 mg twice daily per patient report, no active script on file with the pharmacy; Vimpat 200 mg twice daily ? ?Patient has not established with neurology yet, as instructed by PCP at last visit ? ?Diabetes: ?Current medications: metformin 500 mg twice daily - he is out of this prescription and needs refills.  ? ?Objective: ?Lab Results  ?Component Value Date  ? HGBA1C 6.0 (H) 02/27/2021  ? ? ?Lab Results  ?Component Value Date  ? CREATININE 0.96 06/06/2021  ? BUN 15 06/06/2021  ? NA 139 06/06/2021  ? K 4.6 06/06/2021  ? CL 104  06/06/2021  ? CO2 20 06/06/2021  ? ? ?Lab Results  ?Component Value Date  ? CHOL 147 03/23/2021  ? HDL 36 (L) 03/23/2021  ? LDLCALC 86 03/23/2021  ? LDLDIRECT 124.3 (H) 10/17/2020  ? TRIG 127 03/23/2021  ? CHOLHDL 4.1 03/23/2021  ? ? ?Medications Reviewed Today   ? ? Reviewed by [name], RPH-CPP (Pharmacist) on 08/22/21 at 1659  Med List Status: <None>  ? ?Medication Order Taking? Sig Documenting Provider Last Dose Status Informant  ?acetaminophen (TYLENOL) 325 MG tablet [id number]  Take 2 tablets (650 mg total) by mouth every 6 (six) hours as needed for mild pain (or Fever >/= 101).  ?Patient not taking: Reported on 08/08/2021  ? [name], PA-C  Active Spouse/Significant Other  ?amLODipine (NORVASC) 10 MG tablet [id number] No Take 1 tablet (10 mg total) by mouth daily.  ?Patient not taking: Reported on 08/22/2021  ? [name], PA-C Not Taking Active Spouse/Significant Other  ?aspirin EC 81 MG tablet [id number] No Take 1 tablet (81 mg total) by mouth daily. Swallow whole.  ?Patient not taking: Reported on 08/08/2021  ? [name], PA-C Not Taking Active   ?atorvastatin (LIPITOR) 40 MG tablet [id number] Yes Take 1 tablet (40 mg total) by mouth daily. [name], [name], PA-C Taking Active Spouse/Significant Other  ?Patient not taking:  Discontinued 08/22/21 1659 (Completed Course) hydrALAZINE (APRESOLINE) 50 MG tablet [id number] Yes Take 1 tablet (50 mg total) by mouth every 8 (eight) hours. [name], [name], PA-C  Taking Active Spouse/Significant Other  ? Patient not taking:   Discontinued 08/22/21 1659 (Completed Course) ibuprofen (ADVIL) 200 MG tablet [id number]  Take 200 mg by mouth every 8 (eight) hours as needed for moderate pain (Takes once or twice a day PRN). [provider]  Active Spouse/Significant Other  ?isosorbide dinitrate (ISORDIL) 30 MG tablet [id number] Yes Take 1 tablet (30 mg total) by mouth 3 (three) times daily. [name], [name], PA-C Taking Active  Spouse/Significant Other  ?lacosamide (VIMPAT) 200 MG TABS tablet [id number] Yes Take 1 tablet (200 mg total) by mouth 2 (two) times daily. [name], DO Taking Active   ?levETIRAcetam (KEPPRA) 750 MG tablet [id number] Yes Take 1,500 mg by mouth 2 (two) times daily. [provider] Taking Active Spouse/Significant Other  ?metFORMIN (GLUCOPHAGE) 500 MG tablet [id number] No Take 1 tablet (500 mg total) by mouth 2 (two) times daily with a meal. IM PROGRAM  ?Patient not taking: Reported on 08/22/2021  ? [name], PA-C Not Taking Active Spouse/Significant Other  ?         ?Med Note [name]   Tue Aug 22, 2021  3:53 PM)    ? Patient not taking:   Discontinued 08/22/21 1659 (Completed Course) metoprolol tartrate (LOPRESSOR) 50 MG tablet [id number] Yes Take 1 tablet (50 mg total) by mouth 2 (two) times daily. [name], [name], PA-C Taking Active Spouse/Significant Other  ? Patient not taking:   Discontinued 08/22/21 1659 (Completed Course) sacubitril-valsartan (ENTRESTO) 49-51 MG [id number] No Take 1 tablet by mouth 2 (two) times daily. Start on 06/29/2021  ?Patient not taking: Reported on 08/22/2021  ? Elder [name], MD Not Taking Active   ?         ?Med Note [name]   Tue Aug 22, 2021  3:45 PM)    ? Patient not taking:   Discontinued 08/22/21 1659 (Completed Course) spironolactone (ALDACTONE) 25 MG tablet [id number] No Take 0.5 tablets (12.5 mg total) by mouth daily.  ?Patient not taking: Reported on 08/08/2021  ? [name], PA-C Not Taking Active   ?Med List Note [name], CPhT 02/24/21 1756): Vimpat: "12/08/20 receiving PAP through [employer], approved until 12/08/2022"  ? ?  ?  ? ?  ? ? ?Assessment/Plan:  ?Care Plan : Medication Management  ?Updates made by [name], RPH-CPP since 08/22/2021 12:00 AM  ?  ? ?Problem: Medication Access   ?  ? ?Long-Range Goal: Maintain Provider Appointments and Stay Adherent to Medications   ?Note:   ?Current  Barriers:  ?Unable to independently afford treatment regimen ?Does not contact provider office for questions/concerns ?No transportation to pharmacy ? ?Patient Needs: ?Scheduled follow up with primary care provider ?Improved collaboration with dispensing pharmacy ?Transportation to pharmacy and appointments ? ?Patient Activities: ?Patient will:  ?- take medications as prescribed as evidenced by patient report and record review ?collaborate with provider on medication access solutions ? ?  ? ?Medication Access: ?- Collaborated with Probation officer at [facility]. They will fill the medications that they can for the patient and place on a charge account.  ?- Contacted patient's [facility] Medicaid transportation benefit. They do not authorize non-urgent transportation to the pharmacy, and require 2 business days to set up transportation. They would be able to pick up patient from a temporary address. Provided the phone number for transportation to patient's wife, they will call and set up the transportation benefit to the pharmacy in 2 business days.  ? ? ?  Heart Failure: ?- Currently inappropriately managed/opportunity for optimization ?- Per pharmacy, PA required for Entresto. PA sent to cardiology clinic. Will route note to [facility] to notify. Likely hold on spironolactone initiation until patient is stable on Entresto and BMP has been checked.  ? ?Seizures: ?- Currently risk of relapse due to medication access ?- Per pharmacy, PCP is not authorized to prescribe Vimpat and new script for levetiracetam is required. Will collaborate w/ [facility] to address and collaborate with team to get patient scheduled with neurology.  ? ? ?Follow Up Plan: call in 2 days ? ?Catie Feliz Beam, PharmD, BCACP ?[facility] ?[phone] ? ? ? ?

## 2021-08-22 NOTE — Telephone Encounter (Signed)
Called and spoke with patient and his wife regarding PCS forms / patient will need to come in for office visit (tele visit 05-10-21). Wife states he has an aid that comes a couple days a week. Wife will have to check with aide as to when she can bring Mr.Kerolos for office visit due to transportation. Mr.Mansel and wife [name]) per wife are living in an hotel at the moment. [name] will have aide to contact us with agency name for number. ?

## 2021-08-23 ENCOUNTER — Telehealth: Payer: Self-pay | Admitting: Pharmacist

## 2021-08-23 ENCOUNTER — Other Ambulatory Visit: Payer: Self-pay

## 2021-08-23 ENCOUNTER — Ambulatory Visit: Payer: Medicaid Other | Attending: Internal Medicine

## 2021-08-23 ENCOUNTER — Other Ambulatory Visit: Payer: Self-pay | Admitting: Internal Medicine

## 2021-08-23 DIAGNOSIS — R569 Unspecified convulsions: Secondary | ICD-10-CM

## 2021-08-23 MED ORDER — LACOSAMIDE 200 MG PO TABS
200.0000 mg | ORAL_TABLET | Freq: Two times a day (BID) | ORAL | 2 refills | Status: DC
  Filled 2021-08-23: qty 60, 30d supply, fill #0

## 2021-08-23 MED ORDER — LEVETIRACETAM 750 MG PO TABS
1500.0000 mg | ORAL_TABLET | Freq: Two times a day (BID) | ORAL | 3 refills | Status: DC
  Filled 2021-08-23 (×3): qty 90, 23d supply, fill #0

## 2021-08-23 NOTE — Patient Outreach (Signed)
Scripts for levetiracetam and lacosamide sent to pharmacy by Dr. Allena Katz. Called pharmacy. PA needed for lacosamide. Communicated with Dr. Allena Katz to determine who to collaborate with on this.  ? ?Confirmed with Elvin So, PA that she would like patient to start on Entresto and have BMP checked next week prior to adding spironolactone. Will communicate this to patient. ?

## 2021-08-24 ENCOUNTER — Other Ambulatory Visit: Payer: Self-pay

## 2021-08-24 ENCOUNTER — Other Ambulatory Visit (HOSPITAL_COMMUNITY): Payer: Self-pay

## 2021-08-24 NOTE — Patient Outreach (Signed)
Called patient's significant other, Carmen. She did not call to set up transportation to the pharmacy today as we previously discussed. We reviewed Averi's upcoming appointment with Cardiology on Monday at 10:30. I advised [name] to call [facility] transportation as soon as we got off the phone to set up transportation to that appointment Monday, and arrange for a trip to the pharmacy immediately after. [name] agreed she would do that. I provided her with the addressed of [facility] at [facility] so she could provide those to transportation.  ? ?She also notes that their current payment the hotel ends today, and their house is not yet ready. She is trying to come up with money to pay for more time at the hotel. Will collaborate w/ BSW to see if any financial support options.  ? ?Will collaborate with Annye Asa, CMA from [facility] to follow up on status of Vimpat PA. Will follow up with cardiology and retail pharmacy staff to follow up on status of Entresto PA. Communicated to [facility] that patient will not have been on Entresto (or spironolactone) prior to their visit Monday.  ? ?Catie Feliz Beam, PharmD, BCACP ?[facility] ? ?

## 2021-08-24 NOTE — Patient Outreach (Signed)
Collaborated with Probation officer at [facility] [employer]. They will be able to deliver all medications (besides Vimpat and Entresto - still waiting on PA to be approved) - today to patient at hotel.  ? ?Will continue to follow for any support needed for access to [facility] and Vimpat ?

## 2021-08-24 NOTE — Patient Outreach (Signed)
Remembered that [facility] does same day delivery, and since they deliver via driver, may be able to deliver to hotel. They may also allow patient to set up a charge account.  ? ?Patent examiner. They do agree to deliver to a hotel. Contacted Carmen to discuss, left voicemail for her to return my call at her convenience.  ? ? ?

## 2021-08-25 ENCOUNTER — Other Ambulatory Visit: Payer: Self-pay

## 2021-08-25 NOTE — Progress Notes (Signed)
? ?Primary Physician/Referring:  [name], MD ? ?Patient ID: Arthur [name], male    DOB: [birth year], 61 y.o.   MRN: [id number] ? ?Chief Complaint  ?Patient presents with  ? Hypertensive heart disease without heart failure  ?  6 weeks  ? ?HPI:   ? ?Arthur [name]  is a 61 y.o. AA male with history of seizure disorder, hypertension, prediabetes, alcohol abuse, tobacco use.   ? ?Patient was admitted 03/12/2019 - 03/16/2021 with acute encephalopathy at which time echo suggested cardiomyopathy and mild inferior wall motion abnormality. Patient subsequently underwent nuclear stress test which showed small moderate intensity fixed perfusion defect suggestive of prior inferior infarct.  Given abnormal echocardiogram and stress test patient subsequently underwent left heart catheterization. 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  Given findings on coronary angiography Dr. Priscella Mann felt most likely explanation for patient's cardiomyopathy was alcoholic or hypertensive.  ? ?Patient presents for 6-week follow-up.  Last office visit stopped clonidine and switched him to spironolactone 12.5 mg p.o. daily unfortunately repeat BMP has not been done.  He is overall feeling well without specific complaints today.  However medication compliance has been difficult.  He is working closely with clinical pharmacist Vanice Sarah.  Patient likely has run out of Entresto and has not been taking this since last office visit. ? ?Patient admits to dietary noncompliance.  He also unfortunately continues to smoke 2 to 3 cigars/day. ? ?Past Medical History:  ?Diagnosis Date  ? DM2 (diabetes mellitus, type 2) (HCC)   ? ETOH abuse   ? Hypertension   ? Seizures (HCC)   ? ?Past Surgical History:  ?Procedure Laterality Date  ? HEMORRHOID SURGERY    ? INTRAVASCULAR PRESSURE WIRE/FFR STUDY N/A 06/27/2021  ?  Procedure: INTRAVASCULAR PRESSURE WIRE/FFR STUDY;  Surgeon: Elder [name], MD;  Location: [facility] INVASIVE [facility];  Service: Cardiovascular;  Laterality: N/A;  ? LEFT HEART CATH AND CORONARY ANGIOGRAPHY N/A 06/27/2021  ? Procedure: LEFT HEART CATH AND CORONARY ANGIOGRAPHY;  Surgeon: Elder [name], MD;  Location: [facility] INVASIVE CV LAB;  Service: Cardiovascular;  Laterality: N/A;  ? ?Family History  ?Problem Relation Age of Onset  ? Diabetes Mother   ? Hypertension Mother   ? Diabetes Father   ? Hypertension Father   ? Hypertension Sister   ?  ?Social History  ? ?Tobacco Use  ? Smoking status: Every Day  ?  Types: Cigars  ? Smokeless tobacco: Never  ? Tobacco comments:  ?  Smokes black & mild - 3 cigars a day.  ?Substance Use Topics  ? Alcohol use: Not Currently  ? ?Marital Status: Married  ? ?ROS  ?Review of Systems  ?Cardiovascular:  Positive for dyspnea on exertion (stable, mild). Negative for chest pain, claudication, leg swelling, near-syncope, orthopnea, palpitations, paroxysmal nocturnal dyspnea and syncope.  ?Respiratory:  Negative for shortness of breath.   ?Neurological:  Negative for dizziness.  ? ?Objective  ?Blood pressure 132/78, pulse 74, temperature 98 ?F (36.7 ?C), temperature source Temporal, resp. rate 17, height 5\' 3"  (1.6 m), weight 166 lb 12.8 oz (75.7 kg), SpO2 98 %.  ? ?  08/28/2021  ? 10:35 AM 07/11/2021  ? 10:19 AM 06/27/2021  ?  6:01 PM  ?Vitals with BMI  ?Height 5\' 3"  5\' 3"    ?Weight 166 lbs 13 oz 158 lbs   ?BMI 29.55 28   ?Systolic  132 128 147  ?Diastolic 78 74 103  ?Pulse 74 93 69  ?  ? ? Physical Exam ?Vitals reviewed.  ?HENT:  ?   Head: Normocephalic and atraumatic.  ?Cardiovascular:  ?   Rate and Rhythm: Normal rate and regular rhythm.  ?   Pulses: Intact distal pulses.  ?   Heart sounds: S1 normal and S2 normal. No murmur heard. ?  No gallop.  ?Pulmonary:  ?   Effort: Pulmonary effort is normal. No respiratory distress.  ?   Breath sounds: No wheezing, rhonchi or rales.   ?Musculoskeletal:  ?   Right lower leg: No edema.  ?   Left lower leg: No edema.  ?Neurological:  ?   Mental Status: He is alert.  ?Physical exam unchanged compared to previous office visit. ? ?Laboratory examination:  ? ?CrCl cannot be calculated (Patient's most recent lab result is older than the maximum 21 days allowed.).  ? ?  Latest Ref Rng & Units 06/06/2021  ?  3:58 PM 03/24/2021  ?  5:05 AM 03/23/2021  ?  5:21 AM  ?CMP  ?Glucose 70 - 99 mg/dL 91   132   99    ?BUN 6 - 24 mg/dL 15   20   19     ?Creatinine 0.76 - 1.27 mg/dL 4.40   1.02   7.25    ?Sodium 134 - 144 mmol/L 139   132   137    ?Potassium 3.5 - 5.2 mmol/L 4.6   3.7   4.0    ?Chloride 96 - 106 mmol/L 104   101   106    ?CO2 20 - 29 mmol/L 20   24   25     ?Calcium 8.7 - 10.2 mg/dL 9.3   8.8   8.9    ?Total Protein 6.0 - 8.5 g/dL 7.5      ?Total Bilirubin 0.0 - 1.2 mg/dL 0.4      ?Alkaline Phos 44 - 121 IU/L 96      ?AST 0 - 40 IU/L 20      ?ALT 0 - 44 IU/L 14      ? ? ?  Latest Ref Rng & Units 06/06/2021  ?  3:58 PM 03/20/2021  ?  5:40 AM 03/13/2021  ?  5:20 AM  ?CBC  ?WBC 3.4 - 10.8 x10E3/uL 6.7   6.3   5.4    ?Hemoglobin 13.0 - 17.7 g/dL 36.6   44.0   34.7    ?Hematocrit 37.5 - 51.0 % 43.7   40.9   39.1    ?Platelets 150 - 450 x10E3/uL 292   380   349    ? ? ?Lipid Panel ?Recent Labs  ?  10/17/20 ?0151 10/18/20 ?0735 03/23/21 ?4259  ?CHOL 217*  --  147  ?TRIG 480* 485* 127  ?LDLCALC UNABLE TO CALCULATE IF TRIGLYCERIDE OVER 400 mg/dL  --  86  ?VLDL UNABLE TO CALCULATE IF TRIGLYCERIDE OVER 400 mg/dL  --  25  ?HDL <10*  --  36*  ?CHOLHDL NOT CALCULATED  --  4.1  ?LDLDIRECT 124.3*  --   --   ? ? ?HEMOGLOBIN A1C ?Lab Results  ?Component Value Date  ? HGBA1C 6.0 (H) 02/27/2021  ? MPG 125.5 02/27/2021  ? ?TSH ?Recent Labs  ?  02/27/21 ?0244  ?TSH 1.377  ? ?External labs:  ?None  ? ?Allergies  ? ?Allergies  ?Allergen Reactions  ? Penicillins Hives  ?  Did it involve swelling of the  face/tongue/throat, SOB, or low BP? Y ?Did it involve sudden or severe  rash/hives, skin peeling, or any reaction on the inside of your mouth or nose? N ?Did you need to seek medical attention at a hospital or doctor's office? Y ?When did it last happen?  Over 5 Years Ago     ?If all above answers are ?NO?, may proceed with cephalosporin use. ?  ?  ?Medications Prior to Visit:  ? ?Outpatient Medications Prior to Visit  ?Medication Sig Dispense Refill  ? amLODipine (NORVASC) 10 MG tablet Take 1 tablet (10 mg total) by mouth daily. 90 tablet 3  ? aspirin EC 81 MG tablet Take 1 tablet (81 mg total) by mouth daily. Swallow whole. 90 tablet 3  ? atorvastatin (LIPITOR) 40 MG tablet Take 1 tablet (40 mg total) by mouth daily. 90 tablet 3  ? hydrALAZINE (APRESOLINE) 50 MG tablet Take 1 tablet (50 mg total) by mouth every 8 (eight) hours. 270 tablet 3  ? ibuprofen (ADVIL) 200 MG tablet Take 200 mg by mouth every 8 (eight) hours as needed for moderate pain (Takes once or twice a day PRN).    ? isosorbide dinitrate (ISORDIL) 30 MG tablet Take 1 tablet (30 mg total) by mouth 3 (three) times daily. 270 tablet 3  ? lacosamide (VIMPAT) 200 MG TABS tablet Take 1 tablet (200 mg total) by mouth 2 (two) times daily. 180 tablet 1  ? levETIRAcetam (KEPPRA) 750 MG tablet Take 2 tablets (1,500 mg total) by mouth 2 (two) times daily. 90 tablet 3  ? metoprolol tartrate (LOPRESSOR) 50 MG tablet Take 1 tablet (50 mg total) by mouth 2 (two) times daily. 180 tablet 3  ? sacubitril-valsartan (ENTRESTO) 49-51 MG Take 1 tablet by mouth 2 (two) times daily. Start on 06/29/2021 60 tablet 2  ? spironolactone (ALDACTONE) 25 MG tablet Take 0.5 tablets (12.5 mg total) by mouth daily. 45 tablet 3  ? metFORMIN (GLUCOPHAGE) 500 MG tablet Take 1 tablet (500 mg total) by mouth 2 (two) times daily with a meal. IM PROGRAM (Patient not taking: Reported on 08/28/2021) 60 tablet 0  ? acetaminophen (TYLENOL) 325 MG tablet Take 2 tablets (650 mg total) by mouth every 6 (six) hours as needed for mild pain (or Fever >/= 101).    ? ?No  facility-administered medications prior to visit.  ? ?Final Medications at End of Visit   ? ?Current Meds  ?Medication Sig  ? amLODipine (NORVASC) 10 MG tablet Take 1 tablet (10 mg total) by mouth daily.  ? aspirin EC 81 MG tablet Ta

## 2021-08-28 ENCOUNTER — Other Ambulatory Visit: Payer: Self-pay

## 2021-08-28 ENCOUNTER — Ambulatory Visit: Payer: Medicaid Other | Admitting: Student

## 2021-08-28 ENCOUNTER — Encounter: Payer: Self-pay | Admitting: Student

## 2021-08-28 ENCOUNTER — Other Ambulatory Visit: Payer: Self-pay | Admitting: *Deleted

## 2021-08-28 VITALS — BP 132/78 | HR 74 | Temp 98.0°F | Resp 17 | Ht 63.0 in | Wt 166.8 lb

## 2021-08-28 DIAGNOSIS — R4189 Other symptoms and signs involving cognitive functions and awareness: Secondary | ICD-10-CM | POA: Diagnosis not present

## 2021-08-28 DIAGNOSIS — I119 Hypertensive heart disease without heart failure: Secondary | ICD-10-CM | POA: Diagnosis not present

## 2021-08-28 DIAGNOSIS — R569 Unspecified convulsions: Secondary | ICD-10-CM

## 2021-08-28 DIAGNOSIS — I519 Heart disease, unspecified: Secondary | ICD-10-CM

## 2021-08-28 DIAGNOSIS — I10 Essential (primary) hypertension: Secondary | ICD-10-CM

## 2021-08-28 MED ORDER — LACOSAMIDE 200 MG PO TABS
200.0000 mg | ORAL_TABLET | Freq: Two times a day (BID) | ORAL | 1 refills | Status: DC
  Filled 2021-08-28: qty 180, 90d supply, fill #0

## 2021-08-28 NOTE — Telephone Encounter (Signed)
Received following message from Vanice Sarah, RPH-CPP: ? ?Yes, I need rx for generic Vimpat to be sent by attending to [facility] at [facility]. As of today, a PA should no longer required for this medication (Medicaid formulary updated on Saturday) ?

## 2021-08-29 ENCOUNTER — Telehealth: Payer: Self-pay | Admitting: Pharmacist

## 2021-08-29 ENCOUNTER — Other Ambulatory Visit: Payer: Self-pay

## 2021-08-29 NOTE — Patient Outreach (Signed)
Received message from Lorenza Burton, PharmD with [facility] that patient was requesting delivery of lacosamide script.  ? ?[employer]. She believes patient has enough lacosamide at home to last until PCP appointment next week. Medicaid transportation will take them to the pharmacy immediately after a PCP visit. She confirms they have transportation set up for that appointment. She will call me back if she gets back to the hotel and realizes patient does not have enough of a lacosamide supply to last a week.  ? ?Will follow up with cardiology regarding Entresto PA. Ideally, patient will be able to pick up [facility] script from the pharmacy next Tuesday at the same time. Will collaborate with cardiology to see about process of scheduling f/u BMP and lab appointment (so Medicaid transportation will take him) ~ 7-10 days after starting Entresto.  ? ?Catie Eppie Gibson, PharmD, BCACP ? Medical [facility] ? ?

## 2021-08-31 ENCOUNTER — Other Ambulatory Visit: Payer: Self-pay

## 2021-09-05 ENCOUNTER — Other Ambulatory Visit: Payer: Self-pay

## 2021-09-05 ENCOUNTER — Encounter: Payer: Self-pay | Admitting: Internal Medicine

## 2021-09-05 ENCOUNTER — Ambulatory Visit (INDEPENDENT_AMBULATORY_CARE_PROVIDER_SITE_OTHER): Payer: Medicaid Other | Admitting: Internal Medicine

## 2021-09-05 VITALS — BP 128/78 | HR 103 | Temp 98.0°F | Ht 62.0 in | Wt 167.4 lb

## 2021-09-05 DIAGNOSIS — R413 Other amnesia: Secondary | ICD-10-CM

## 2021-09-05 DIAGNOSIS — R569 Unspecified convulsions: Secondary | ICD-10-CM

## 2021-09-05 DIAGNOSIS — E119 Type 2 diabetes mellitus without complications: Secondary | ICD-10-CM

## 2021-09-05 DIAGNOSIS — E559 Vitamin D deficiency, unspecified: Secondary | ICD-10-CM | POA: Diagnosis not present

## 2021-09-05 DIAGNOSIS — R4189 Other symptoms and signs involving cognitive functions and awareness: Secondary | ICD-10-CM

## 2021-09-05 DIAGNOSIS — G3184 Mild cognitive impairment, so stated: Secondary | ICD-10-CM | POA: Diagnosis not present

## 2021-09-05 MED ORDER — LEVETIRACETAM 750 MG PO TABS
1500.0000 mg | ORAL_TABLET | Freq: Two times a day (BID) | ORAL | 3 refills | Status: DC
  Filled 2021-09-05: qty 180, 45d supply, fill #0

## 2021-09-05 MED ORDER — METFORMIN HCL 500 MG PO TABS
500.0000 mg | ORAL_TABLET | Freq: Two times a day (BID) | ORAL | 1 refills | Status: DC
  Filled 2021-09-05: qty 180, 90d supply, fill #0

## 2021-09-05 MED ORDER — LACOSAMIDE 200 MG PO TABS
200.0000 mg | ORAL_TABLET | Freq: Two times a day (BID) | ORAL | 3 refills | Status: DC
  Filled 2021-09-05: qty 60, 30d supply, fill #0
  Filled 2021-09-07: qty 180, 90d supply, fill #0

## 2021-09-05 NOTE — Patient Instructions (Addendum)
Mr Arthur [name], ? ?It was a pleasure seeing you in clinic. Today we discussed:  ? ?Memory impairment:  ?I am checking on some lab work and will call you with any abnormal results. ?Medication refills have been sent to your pharmacy for : Vimpat, Keppra, Metformin  ?I will send a referral back to rehab to help with speech therapy and overall functioning ?Follow up with neurology at end of month ? ?If you have any questions or concerns, please call our clinic at [phone] between 9am-5pm and after hours call [phone] and ask for the internal medicine resident on call. If you feel you are having a medical emergency please call 911.  ? ?Thank you, we look forward to helping you remain healthy! ? ? ?

## 2021-09-05 NOTE — Progress Notes (Signed)
? ?  CC: pcs, memory issues and gait imbalance  ? ?HPI: ? ?[name] is a 61 y.o. male with PMHx as stated below presenting for evaluation of ongoing memory issues and gait imbalance. Patient is completely dependent in ADLs and has had some mood changes.  Also has persistent expressive aphasia since hospital discharge. Please see problem based charting for complete assessment and plan.  ? ?Past Medical History:  ?Diagnosis Date  ? DM2 (diabetes mellitus, type 2) (HCC)   ? ETOH abuse   ? Hypertension   ? Seizures (HCC)   ? ?Review of Systems:  Negative except as stated in HPI. ? ?Physical Exam: ? ?Vitals:  ? 09/05/21 1036  ?BP: 128/78  ?Pulse: (!) 103  ?Temp: 98 ?F (36.7 ?C)  ?TempSrc: Oral  ?SpO2: 100%  ?Weight: 167 lb 6.4 oz (75.9 kg)  ?Height: 5\' 2"  (1.575 m)  ? ?Physical Exam  ?Constitutional: Appears well-developed and well-nourished. No distress.  ?HENT: Normocephalic and atraumatic, EOMI, conjunctiva normal, moist mucous membranes ?Cardiovascular: Normal rate, regular rhythm, S1 and S2 present, no murmurs, rubs, gallops.  Distal pulses intact ?Respiratory: Lungs are clear to auscultation bilaterally. ?Musculoskeletal: Normal bulk and tone.  No peripheral edema noted. ?Neurological: is alert and oriented x4, expressive aphasia; mini-cog 0. No gait abnormality noted.  ?Skin: Warm and dry.  No rash, erythema, lesions noted. ?Psychiatric: Normal mood and affect. ? ?Assessment & Plan:  ? ?See Encounters Tab for problem based charting. ? ?Patient discussed with Dr. ? ?

## 2021-09-05 NOTE — Patient Instructions (Signed)
Visit Information ? ?Arthur [name] was given information about Medicaid Managed Care team care coordination services as a part of their [facility] Community Plan Medicaid benefit. Arthur [name], primary caregiver, wife Arthur [name], verbally consented to engagement with the [facility] Managed Care team.  ? ?If you are experiencing a medical emergency, please call 911 or report to your local emergency department or urgent care.  ? ?If you have a non-emergency medical problem during routine business hours, please contact your provider's office and ask to speak with a nurse.  ? ?For questions related to your [facility], please call: [phone] or visit the homepage here: [email or web address] ? ?If you would like to schedule transportation through your [facility], please call the following number at least 2 days in advance of your appointment: [phone]. ? Rides for urgent appointments can also be made after hours by calling Member Services. ? ?Call the Behavioral Health Crisis Line at [phone], at any time, 24 hours a day, 7 days a week. If you are in danger or need immediate medical attention call 911. ? ?If you would like help to quit smoking, call 1-800-QUIT-NOW (819 269 5366) OR Espa?ol: 1-855-D?jelo-Ya 640 780 4121) o para m?s informaci?n haga clic aqu? or Text READY to 200-400 to register via text ? ?Arthur [name] - following are the goals we discussed in your visit today:  Please see Patient Goals in the RN Care Manager Plan of Care below.  ? ? ?Please see education materials related to today's visit provided as print materials.  ? ?The patient verbalized understanding of instructions, educational materials, and care plan provided today and agreed to receive a mailed copy of patient instructions, educational materials, and care plan.  ? ?The Managed Medicaid care management team will reach out to  the patient again over the next 30 days.  ? ?Arthur Norfolk RN, BSN ?Community Care Coordinator ?[place]  [facility] ?Mobile: [phone]  ? ?Following is a copy of your plan of care:  ?Care Plan : RN Care Manager Plan of Care  ?Updates made by Leane Call, RN since 09/05/2021 12:00 AM  ?  ? ?Problem: Chronic Disease Management and Care Coordination Needs for DMII, HTN, CAD, Seizures   ?Priority: High  ?  ? ?Long-Range Goal: Development of Plan of Care for Chronic Disease Management and Care Coordination Needs (CAD, HTN, DMII, Seizures)   ?Start Date: 08/08/2021  ?Expected End Date: 12/06/2021  ?Priority: High  ?Note:   ?Current Barriers:  ?Knowledge Deficits related to plan of care for management of CAD, HTN, DMII, and Seizures  ?Care Coordination needs related to Financial constraints related to affordable safe housing and utility payment, Housing barriers, Medication procurement, ADL IADL limitations, Literacy concerns, Cognitive Deficits, Memory Deficits, Inability to perform IADL's independently, and Lacks knowledge of community resource: safe & affordable Section 8 Housing, utility payment assistance and dental care providers covered by [employer]  ?Chronic Disease Management support and education needs related to CAD, HTN, DMII, and Seizures ?Corporate treasurer.  ?Non-adherence to prescribed medication regimen ?Difficulty obtaining medications ?Cognitive Deficits ?No Advanced Directives in place ?Falls - Increased Potential for Falls ? ?RNCM Clinical Goal(s):  ?Patient will verbalize understanding of plan for management of CAD, HTN, DMII, and Seizures as evidenced by improved management of these chronic diseases. ?verbalize basic understanding of CAD, HTN, DMII, and Seizures disease process and self health management plan as evidenced by noted improvement of management of these chronic diseases. ?take all  medications exactly as prescribed and will call provider  for medication related questions as evidenced by being compliant with all medications    ?attend all scheduled medical appointments: 09/18/2021 Echocardiogram  as evidenced by attending all scheduled appointments        ?demonstrate improved adherence to prescribed treatment plan for CAD, HTN, DMII, and Seizures as evidenced by overall improved management of these chronic diseases ?continue to work with Medical illustrator and/or Social Worker to address care management and care coordination needs related to CAD, HTN, DMII, and Seizures as evidenced by adherence to CM Team Scheduled appointments     ?work with pharmacist to address Financial constraints related to obtaining medications and Medication procurement related to CAD, HTN, DMII, and Seizures as evidenced by review of EMR and patient or pharmacist report    ?work with Child psychotherapist to address Financial constraints related to safe affordable Housing and utility payment , Housing barriers, ADL IADL limitations, Cognitive Deficits, Memory Deficits, Inability to perform IADL's independently, and Lacks knowledge of community resource: safe and affordable Section 8 Housing, utility payment assistance and dental care providers covered by [employer] insurance related to the management of CAD, HTN, DMII, and Seizures as evidenced by review of EMR and patient or Child psychotherapist report     through collaboration with Medical illustrator, provider, and care team.  ? ?Interventions: ?Inter-disciplinary care team collaboration (see longitudinal plan of care) ?Evaluation of current treatment plan related to  self management and patient's adherence to plan as established by provider ?BSW completed telephone outreach with patient and his wife. They stated they are currently in a hotel but are waiting on paperwork and inspections to be completed on the new house. They will be in the hotel until 08/24/21, but do not know when the new house will be ready to move in. Patient does not  have any income right. They are needing assistance with paying the last utility bill from their previous home. BSW informed they can go to DSS and apply for the utility program, however since the services are disconnected due to moving, they may not assist. They provided an email address for the dental resources to be emailed to [email or web address]. No other resources are needed at this time.  ?09/05/21: BSW completed follow up with patient's wife while patient was at an appointment. She stated they are still in the hotel and patient still does not have any income. They have applied for disability and are waiting for an appeal letter. [name] states they have been in contact with the housing caseworker and she did state that it can take some time.  ? ? ?CAD  (Status: Goal on Track (progressing): YES.) Long Term Goal  ?Assessed understanding of CAD diagnosis ?Medications reviewed including medications utilized in CAD treatment plan ?Provided education on importance of blood pressure control in management of CAD; ?Reviewed Importance of taking all medications as prescribed ?Reviewed Importance of attending all scheduled provider appointments ?Assessed social determinant of health barriers;  ?[name] denies patient having any chest pain, edema or shortness of breath.  Patient continues to have occasional dizziness and lightheadedness when ambulating per wife. Wife also reports patient weight is stable with current weight: 167 lbs. ? ?Diabetes:  (Status: Goal on Track (progressing): YES.) Long Term Goal  ? ?Lab Results  ?Component Value Date  ? HGBA1C 6.0 (H) 02/27/2021  ?  ?Assessed patient's understanding of A1c goal: <7% ?Provided education to patient about basic DM disease process; ?Reviewed  medications with patient and discussed importance of medication adherence;        ?Discussed plans with patient for ongoing care management follow up and provided patient with direct contact information for care  management team;      ?Reviewed scheduled/upcoming provider appointments including: 09/18/2021 Echocardiogram;         ?Referral made to pharmacy team for assistance with complex medication regimen; [facility]

## 2021-09-05 NOTE — Patient Outreach (Signed)
?Medicaid Managed Care ?Social Work Note ? ?09/05/2021 ?Name:  Arthur [name] MRN:  [id number] DOB:  [birth year] ? ?Arthur [name] is an 61 y.o. year old male who is a primary patient of [name], MD.  The Medicaid Managed Care Coordination team was consulted for assistance with:  Community Resources  ? ?[name] was given information about Medicaid Managed Care Coordination team services today. Arthur [name] Primary Caregiver agreed to services and verbal consent obtained. ? ?Engaged with patient  for by telephone forfollow up visit in response to referral for case management and/or care coordination services.  ? ?Assessments/Interventions:  Review of past medical history, allergies, medications, health status, including review of consultants reports, laboratory and other test data, was performed as part of comprehensive evaluation and provision of chronic care management services. ? ?SDOH: (Social Determinant of Health) assessments and interventions performed: ?BSW completed follow up with patient's wife while patient was at an appointment. She stated they are still in the hotel and patient still does not have any income. They have applied for disability and are waiting for an appeal letter. [name] states they have been in contact with the housing caseworker and she did state that it can take some time.  ? ?Advanced Directives Status:  Not addressed in this encounter. ? ?Care Plan ?                ?Allergies  ?Allergen Reactions  ? Penicillins Hives  ?  Did it involve swelling of the face/tongue/throat, SOB, or low BP? Y ?Did it involve sudden or severe rash/hives, skin peeling, or any reaction on the inside of your mouth or nose? N ?Did you need to seek medical attention at a hospital or doctor's office? Y ?When did it last happen?  Over 5 Years Ago     ?If all above answers are ?NO?, may proceed with cephalosporin use. ?  ? ? ?Medications Reviewed Today   ? ? Reviewed by Charyl Bigger (Medical Assistant) on  08/28/21 at 1044  Med List Status: <None>  ? ?Medication Order Taking? Sig Documenting Provider Last Dose Status Informant  ?amLODipine (NORVASC) 10 MG tablet [id number] Yes Take 1 tablet (10 mg total) by mouth daily. [name], [name], PA-C Taking Active Spouse/Significant Other  ?aspirin EC 81 MG tablet [id number] Yes Take 1 tablet (81 mg total) by mouth daily. Swallow whole. [name], [name], PA-C Taking Active   ?atorvastatin (LIPITOR) 40 MG tablet [id number] Yes Take 1 tablet (40 mg total) by mouth daily. [name], [name], PA-C Taking Active Spouse/Significant Other  ?hydrALAZINE (APRESOLINE) 50 MG tablet [id number] Yes Take 1 tablet (50 mg total) by mouth every 8 (eight) hours. [name], [name], PA-C Taking Active Spouse/Significant Other  ?ibuprofen (ADVIL) 200 MG tablet [id number] Yes Take 200 mg by mouth every 8 (eight) hours as needed for moderate pain (Takes once or twice a day PRN). [provider] Taking Active Spouse/Significant Other  ?isosorbide dinitrate (ISORDIL) 30 MG tablet [id number] Yes Take 1 tablet (30 mg total) by mouth 3 (three) times daily. [name], [name], PA-C Taking Active Spouse/Significant Other  ?lacosamide (VIMPAT) 200 MG TABS tablet [id number] Yes Take 1 tablet (200 mg total) by mouth 2 (two) times daily. [name], MD Taking Active   ?levETIRAcetam (KEPPRA) 750 MG tablet [id number] Yes Take 2 tablets (1,500 mg total) by mouth 2 (two) times daily. [name], MD Taking Active   ?metFORMIN (GLUCOPHAGE) 500 MG tablet [id number] No Take 1 tablet (500  mg total) by mouth 2 (two) times daily with a meal. IM PROGRAM  ?Patient not taking: Reported on 08/28/2021  ? [name], PA-C Not Taking Active Spouse/Significant Other  ?         ?Med Note [name]   Tue Aug 22, 2021  3:53 PM)    ?metoprolol tartrate (LOPRESSOR) 50 MG tablet [id number] Yes Take 1 tablet (50 mg total) by mouth 2 (two) times daily. [name], [name], PA-C Taking Active  Spouse/Significant Other  ?sacubitril-valsartan (ENTRESTO) 49-51 MG [id number] Yes Take 1 tablet by mouth 2 (two) times daily. Start on 06/29/2021 Elder [name], MD Taking Active   ?         ?Med Note [name]   Tue Aug 22, 2021  3:45 PM)    ?spironolactone (ALDACTONE) 25 MG tablet [id number] Yes Take 0.5 tablets (12.5 mg total) by mouth daily. [name], [name], PA-C Taking Active   ?Med List Note Arthur [name], CPhT 02/24/21 1756): [place]: "12/08/20 receiving PAP through [employer], approved until 12/08/2022"  ? ?  ?  ? ?  ? ? ?Patient Active Problem List  ? Diagnosis Date Noted  ? Coronary artery disease   ? HFrEF (heart failure with reduced ejection fraction) (HCC) 05/10/2021  ? Controlled type 2 diabetes mellitus with hyperglycemia, without long-term current use of insulin (HCC)   ? Encephalopathy acute 03/11/2021  ? Seizures (HCC) 02/24/2021  ? Aortic atherosclerosis (HCC) 11/22/2020  ? History of seizures 11/22/2020  ? Alcohol use disorder, severe, dependence (HCC) 11/22/2020  ? Type 2 diabetes mellitus (HCC) 11/22/2020  ? Anemia 11/22/2020  ? Hyperlipidemia 11/22/2020  ? Acute encephalopathy 10/16/2020  ? Transaminitis   ? Essential hypertension 10/08/2019  ? History of CVA (cerebrovascular accident) 10/02/2019  ? ? ?Conditions to be addressed/monitored per PCP order:   housing ? ?Care Plan : RN Care Manager Plan of Care  ?Updates made by Shaune Leeks since 09/05/2021 12:00 AM  ?  ? ?Problem: Chronic Disease Management and Care Coordiantion Needs for DMII, HTN, CAD, Seizures   ?Priority: High  ?  ? ?Long-Range Goal: Development of Plan of Care for Chronic Disease Management and Care Coordination Needs (CAD, HTN, DMII, Seizures)   ?Start Date: 08/08/2021  ?Expected End Date: 12/06/2021  ?Priority: High  ?Note:   ?Current Barriers:  ?Knowledge Deficits related to plan of care for management of CAD, HTN, DMII, and Seizures  ?Care Coordination needs related to Financial constraints related to  affordable safe housing and utility payment, Housing barriers, Medication procurement, ADL IADL limitations, Literacy concerns, Cognitive Deficits, Memory Deficits, Inability to perform IADL's independently, and Lacks knowledge of community resource: safe & affordable Section 8 Housing, utility payment assistance and dental care providers covered by [employer]  ?Chronic Disease Management support and education needs related to CAD, HTN, DMII, and Seizures ?Corporate treasurer.  ?Non-adherence to prescribed medication regimen ?Difficulty obtaining medications ?Cognitive Deficits ?No Advanced Directives in place ?Falls - Increased Potential for Falls ? ?RNCM Clinical Goal(s):  ?Patient will verbalize understanding of plan for management of CAD, HTN, DMII, and Seizures as evidenced by improved management of these chronic diseases. ?verbalize basic understanding of CAD, HTN, DMII, and Seizures disease process and self health management plan as evidenced by noted improvement of management of these chronic diseases. ?take all medications exactly as prescribed and will call provider for medication related questions as evidenced by being compliant with all medications    ?attend all scheduled  medical appointments: 08/11/2021 Neuro ST Evaluation; 08/22/2021 Cardiologist Visit as evidenced by attending all scheduled appointments        ?demonstrate improved adherence to prescribed treatment plan for CAD, HTN, DMII, and Seizures as evidenced by overall improved management of these chronic diseases ?continue to work with Medical illustrator and/or Social Worker to address care management and care coordination needs related to CAD, HTN, DMII, and Seizures as evidenced by adherence to CM Team Scheduled appointments     ?work with pharmacist to address Financial constraints related to obtaining medications and Medication procurement related to CAD, HTN, DMII, and Seizures as evidenced by review of EMR and patient or  pharmacist report    ?work with Child psychotherapist to address Financial constraints related to safe affordable Housing and utility payment , Housing barriers, ADL IADL limitations, Cognitive Deficits, Memory Deficits

## 2021-09-05 NOTE — Patient Instructions (Signed)
Visit Information ? ?Arthur [name] was given information about Medicaid Managed Care team care coordination services as a part of their [place] [employer] Medicaid benefit. Arthur [name] verbally consented to engagement with the [facility] Managed Care team.  ? ?If you are experiencing a medical emergency, please call 911 or report to your local emergency department or urgent care.  ? ?If you have a non-emergency medical problem during routine business hours, please contact your provider's office and ask to speak with a nurse.  ? ?For questions related to your [facility], please call: [phone] or visit the homepage here: [email or web address] ? ?If you would like to schedule transportation through your [employer], please call the following number at least 2 days in advance of your appointment: [phone]. ? Rides for urgent appointments can also be made after hours by calling Member Services. ? ?Call the Behavioral Health Crisis Line at [phone], at any time, 24 hours a day, 7 days a week. If you are in danger or need immediate medical attention call 911. ? ?If you would like help to quit smoking, call 1-800-QUIT-NOW (734-292-9201) OR Espa?ol: 1-855-D?jelo-Ya 574 324 1446) o para m?s informaci?n haga clic aqu? or Text READY to 200-400 to register via text ? ?Arthur [name]:  ? Goals Addressed   ?None ?  ? ? ? ? ?Social Worker will follow up in 30 days .  ? ?Arthur [name] ?[facility]  ?High Risk Managed Medicaid Team  ?[phone]  ? ?Following is a copy of your plan of care:  ?Care Plan : RN Care Manager Plan of Care  ?Updates made by Shaune Leeks since 09/05/2021 12:00 AM  ?  ? ?Problem: Chronic Disease Management and Care Coordiantion Needs for DMII, HTN, CAD, Seizures   ?Priority: High  ?   ? ?Long-Range Goal: Development of Plan of Care for Chronic Disease Management and Care Coordination Needs (CAD, HTN, DMII, Seizures)   ?Start Date: 08/08/2021  ?Expected End Date: 12/06/2021  ?Priority: High  ?Note:   ?Current Barriers:  ?Knowledge Deficits related to plan of care for management of CAD, HTN, DMII, and Seizures  ?Care Coordination needs related to Financial constraints related to affordable safe housing and utility payment, Housing barriers, Medication procurement, ADL IADL limitations, Literacy concerns, Cognitive Deficits, Memory Deficits, Inability to perform IADL's independently, and Lacks knowledge of community resource: safe & affordable Section 8 Housing, utility payment assistance and dental care providers covered by [employer]  ?Chronic Disease Management support and education needs related to CAD, HTN, DMII, and Seizures ?Corporate treasurer.  ?Non-adherence to prescribed medication regimen ?Difficulty obtaining medications ?Cognitive Deficits ?No Advanced Directives in place ?Falls - Increased Potential for Falls ? ?RNCM Clinical Goal(s):  ?Patient will verbalize understanding of plan for management of CAD, HTN, DMII, and Seizures as evidenced by improved management of these chronic diseases. ?verbalize basic understanding of CAD, HTN, DMII, and Seizures disease process and self health management plan as evidenced by noted improvement of management of these chronic diseases. ?take all medications exactly as prescribed and will call provider for medication related questions as evidenced by being compliant with all medications    ?attend all scheduled medical appointments: 08/11/2021 Neuro ST Evaluation; 08/22/2021 Cardiologist Visit as evidenced by attending all scheduled appointments        ?demonstrate improved adherence to prescribed treatment plan for CAD, HTN,  DMII, and Seizures as evidenced by overall improved management of these chronic diseases ?continue to work  with Medical illustrator and/or Social Worker to address care management and care coordination needs related to CAD, HTN, DMII, and Seizures as evidenced by adherence to CM Team Scheduled appointments     ?work with pharmacist to address Financial constraints related to obtaining medications and Medication procurement related to CAD, HTN, DMII, and Seizures as evidenced by review of EMR and patient or pharmacist report    ?work with Child psychotherapist to address Financial constraints related to safe affordable Housing and utility payment , Housing barriers, ADL IADL limitations, Cognitive Deficits, Memory Deficits, Inability to perform IADL's independently, and Lacks knowledge of community resource: safe and affordable Section 8 Housing, utility payment assistance and dental care providers covered by [employer] insurance related to the management of CAD, HTN, DMII, and Seizures as evidenced by review of EMR and patient or Child psychotherapist report     through collaboration with Medical illustrator, provider, and care team.  ? ?Interventions: ?Inter-disciplinary care team collaboration (see longitudinal plan of care) ?Evaluation of current treatment plan related to  self management and patient's adherence to plan as established by provider ?BSW completed telephone outreach with patient and his wife. They stated they are currently in a hotel but are waiting on paperwork and inspections to be completed on the new house. They will be in the hotel until 08/24/21, but do not know when the new house will be ready to move in. Patient does not have any income right. They are needing assistance with paying the last utility bill from their previous home. BSW informed they can go to DSS and apply for the utility program, however since the services are disconnected due to moving, they may not assist. They provided an email address for the dental resources to be emailed to [email or web address]. No other resources are needed at this time.   ?09/05/21: BSW completed follow up with patient's wife while patient was at an appointment. She stated they are still in the hotel and patient still does not have any income. They have applied for disability and are waiting for an appeal letter. [name] states they have been in contact with the housing caseworker and she did state that it can take some time.  ? ? ?CAD  (Status: New goal.) Long Term Goal  ?Assessed understanding of CAD diagnosis ?Medications reviewed including medications utilized in CAD treatment plan ?Provided education on importance of blood pressure control in management of CAD; ?Reviewed Importance of taking all medications as prescribed ?Reviewed Importance of attending all scheduled provider appointments ?Screening for signs and symptoms of depression related to chronic disease state;  ?Assessed social determinant of health barriers;  ?Patient denies any Chest Pain, edema, shortness of breath.  Patient reports occasional dizziness and lightheadedness when ambulating. ? ?Diabetes:  (Status: New goal.) Long Term Goal  ? ?Lab Results  ?Component Value Date  ? HGBA1C 6.0 (H) 02/27/2021  ?  ?Assessed patient's understanding of A1c goal: <7% ?Provided education to patient about basic DM disease process; ?Reviewed medications with patient and discussed importance of medication adherence;        ?Discussed plans with patient for ongoing care management follow up and provided patient with direct contact information for care management team;      ?Reviewed scheduled/upcoming provider appointments including: 08/11/2021 Neuro ST Evaluation; 08/22/2021 Cardiologist visit;         ?Referral made to pharmacy team for  assistance with complex medication regimen;       ?Referral made to social work team for assistance with housing and utility payment assistance, dental care providers covered by Managed Medicaid;      ?Screening for signs and symptoms of depression related to chronic disease state;         ?Assessed social determinant of health barriers;        ? ?Seizures  (Status: New goal.) Long Term Goal  ?Evaluation of current treatment plan related to  Seizures ,  and any seizure activity,  self-management an

## 2021-09-05 NOTE — Patient Outreach (Cosign Needed)
Medicaid Managed Care   Nurse Care Manager Note  09/05/2021 Name:  Arthur [name] MRN:  [id number] DOB:  05-23-1961  Arthur [name] is an 61 y.o. year old male who is a primary patient of [name], MD.  The [facility] Managed Care Coordination team was consulted for assistance with:    CAD HTN DMII Seizures  Arthur [name] was given information about Medicaid Managed Care Coordination team services today. [name] Primary Caregiver, wife Ella Jubilee, agreed to services and verbal consent obtained.  Engaged with patient by telephone for follow up visit in response to provider referral for case management and/or care coordination services.   Assessments/Interventions:  Review of past medical history, allergies, medications, health status, including review of consultants reports, laboratory and other test data, was performed as part of comprehensive evaluation and provision of chronic care management services.  SDOH (Social Determinants of Health) assessments and interventions performed:   Care Plan  Allergies  Allergen Reactions   Penicillins Hives    Did it involve swelling of the face/tongue/throat, SOB, or low BP? Y Did it involve sudden or severe rash/hives, skin peeling, or any reaction on the inside of your mouth or nose? N Did you need to seek medical attention at a hospital or doctor's office? Y When did it last happen?  Over 5 Years Ago     If all above answers are "NO", may proceed with cephalosporin use.     Medications Reviewed Today     Reviewed by Leane Call, RN (Case Manager) on 09/05/21 at 1442  Med List Status: <None>   Medication Order Taking? Sig Documenting Provider Last Dose Status Informant  amLODipine (NORVASC) 10 MG tablet [id number] No Take 1 tablet (10 mg total) by mouth daily. [name], [name], PA-C Taking Active Spouse/Significant Other  aspirin EC 81 MG tablet [id number] No Take 1 tablet (81 mg total) by mouth daily. Swallow whole.  [name], [name], PA-C Taking Active   atorvastatin (LIPITOR) 40 MG tablet [id number] No Take 1 tablet (40 mg total) by mouth daily. [name], [name], PA-C Taking Active Spouse/Significant Other  hydrALAZINE (APRESOLINE) 50 MG tablet [id number] No Take 1 tablet (50 mg total) by mouth every 8 (eight) hours. [name], [name], PA-C Taking Active Spouse/Significant Other  ibuprofen (ADVIL) 200 MG tablet [id number] No Take 200 mg by mouth every 8 (eight) hours as needed for moderate pain (Takes once or twice a day PRN). [provider] Taking Active Spouse/Significant Other  isosorbide dinitrate (ISORDIL) 30 MG tablet [id number] No Take 1 tablet (30 mg total) by mouth 3 (three) times daily. [name], [name], PA-C Taking Active Spouse/Significant Other  lacosamide (VIMPAT) 200 MG TABS tablet [id number]  Take 1 tablet (200 mg total) by mouth 2 (two) times daily. Eliezer Bottom, MD  Active   levETIRAcetam (KEPPRA) 750 MG tablet [id number]  Take 2 tablets (1,500 mg total) by mouth 2 (two) times daily. Eliezer Bottom, MD  Active   metFORMIN (GLUCOPHAGE) 500 MG tablet [id number]  Take 1 tablet (500 mg total) by mouth 2 (two) times daily with a meal. IM PROGRAM Aslam, Sadia, MD  Active   metoprolol tartrate (LOPRESSOR) 50 MG tablet [id number] No Take 1 tablet (50 mg total) by mouth 2 (two) times daily. [name], [name], PA-C Taking Active Spouse/Significant Other  sacubitril-valsartan (ENTRESTO) 49-51 MG [id number] No Take 1 tablet by mouth 2 (two) times daily. Start on 06/29/2021 Elder [name], MD Taking Active  Med Note Feliz Beam, CATHERINE E   Tue Aug 22, 2021  3:45 PM)    spironolactone (ALDACTONE) 25 MG tablet [id number] No Take 0.5 tablets (12.5 mg total) by mouth daily. [name], PA-C Taking Active   Med List Note [name], CPhT 02/24/21 1756): [facility]: "12/08/20 receiving PAP through [employer], approved until 12/08/2022"            Patient Active Problem List    Diagnosis Date Noted   Coronary artery disease    HFrEF (heart failure with reduced ejection fraction) (HCC) 05/10/2021   Controlled type 2 diabetes mellitus with hyperglycemia, without long-term current use of insulin (HCC)    Encephalopathy acute 03/11/2021   Seizures (HCC) 02/24/2021   Aortic atherosclerosis (HCC) 11/22/2020   History of seizures 11/22/2020   Alcohol use disorder, severe, dependence (HCC) 11/22/2020   Type 2 diabetes mellitus (HCC) 11/22/2020   Anemia 11/22/2020   Hyperlipidemia 11/22/2020   Acute encephalopathy 10/16/2020   Transaminitis    Essential hypertension 10/08/2019   History of CVA (cerebrovascular accident) 10/02/2019    Conditions to be addressed/monitored per PCP order:  CAD, HTN, DMII, and Seizures  Care Plan : RN Care Manager Plan of Care  Updates made by Leane Call, RN since 09/05/2021 12:00 AM     Problem: Chronic Disease Management and Care Coordination Needs for DMII, HTN, CAD, Seizures   Priority: High     Long-Range Goal: Development of Plan of Care for Chronic Disease Management and Care Coordination Needs (CAD, HTN, DMII, Seizures)   Start Date: 08/08/2021  Expected End Date: 12/06/2021  Priority: High  Note:   Current Barriers:  Knowledge Deficits related to plan of care for management of CAD, HTN, DMII, and Seizures  Care Coordination needs related to Financial constraints related to affordable safe housing and utility payment, Housing barriers, Medication procurement, ADL IADL limitations, Literacy concerns, Cognitive Deficits, Memory Deficits, Inability to perform IADL's independently, and Lacks knowledge of community resource: safe & affordable Section 8 Housing, utility payment assistance and dental care providers covered by [employer]  Chronic Disease Management support and education needs related to CAD, HTN, DMII, and Seizures Financial Constraints.  Non-adherence to prescribed medication  regimen Difficulty obtaining medications Cognitive Deficits No Advanced Directives in place Falls - Increased Potential for Falls  RNCM Clinical Goal(s):  Patient will verbalize understanding of plan for management of CAD, HTN, DMII, and Seizures as evidenced by improved management of these chronic diseases. verbalize basic understanding of CAD, HTN, DMII, and Seizures disease process and self health management plan as evidenced by noted improvement of management of these chronic diseases. take all medications exactly as prescribed and will call provider for medication related questions as evidenced by being compliant with all medications    attend all scheduled medical appointments: 09/18/2021 Echocardiogram  as evidenced by attending all scheduled appointments        demonstrate improved adherence to prescribed treatment plan for CAD, HTN, DMII, and Seizures as evidenced by overall improved management of these chronic diseases continue to work with Medical illustrator and/or Social Worker to address care management and care coordination needs related to CAD, HTN, DMII, and Seizures as evidenced by adherence to CM Team Scheduled appointments     work with pharmacist to address Financial constraints related to obtaining medications and Medication procurement related to CAD, HTN, DMII, and Seizures as evidenced by review of EMR and patient or pharmacist report    work with social  worker to address Financial constraints related to safe affordable Housing and utility payment , Housing barriers, ADL IADL limitations, Cognitive Deficits, Memory Deficits, Inability to perform IADL's independently, and Lacks knowledge of community resource: safe and affordable Section 8 Housing, utility payment assistance and dental care providers covered by Managed Medicaid insurance related to the management of CAD, HTN, DMII, and Seizures as evidenced by review of EMR and patient or Child psychotherapist report     through  collaboration with Medical illustrator, provider, and care team.   Interventions: Inter-disciplinary care team collaboration (see longitudinal plan of care) Evaluation of current treatment plan related to  self management and patient's adherence to plan as established by provider BSW completed telephone outreach with patient and his wife. They stated they are currently in a hotel but are waiting on paperwork and inspections to be completed on the new house. They will be in the hotel until 08/24/21, but do not know when the new house will be ready to move in. Patient does not have any income right. They are needing assistance with paying the last utility bill from their previous home. BSW informed they can go to DSS and apply for the utility program, however since the services are disconnected due to moving, they may not assist. They provided an email address for the dental resources to be emailed to [email or web address]. No other resources are needed at this time.  09/05/21: BSW completed follow up with patient's wife while patient was at an appointment. She stated they are still in the hotel and patient still does not have any income. They have applied for disability and are waiting for an appeal letter. [name] states they have been in contact with the housing caseworker and she did state that it can take some time.    CAD  (Status: Goal on Track (progressing): YES.) Long Term Goal  Assessed understanding of CAD diagnosis Medications reviewed including medications utilized in CAD treatment plan Provided education on importance of blood pressure control in management of CAD; Reviewed Importance of taking all medications as prescribed Reviewed Importance of attending all scheduled provider appointments Assessed social determinant of health barriers;  [name] denies patient having any chest pain, edema or shortness of breath.  Patient continues to have occasional dizziness and lightheadedness  when ambulating per wife. Wife also reports patient weight is stable with current weight: 167 lbs.  Diabetes:  (Status: Goal on Track (progressing): YES.) Long Term Goal   Lab Results  Component Value Date   HGBA1C 6.0 (H) 02/27/2021    Assessed patient's understanding of A1c goal: <7% Provided education to patient about basic DM disease process; Reviewed medications with patient and discussed importance of medication adherence;        Discussed plans with patient for ongoing care management follow up and provided patient with direct contact information for care management team;      Reviewed scheduled/upcoming provider appointments including: 09/18/2021 Echocardiogram;         Referral made to pharmacy team for assistance with complex medication regimen; [facility] Pharmacist involved in care;       Referral made to social work team for assistance with housing and utility payment assistance, dental care providers covered by Managed Medicaid; [facility] BSW involved in care;      Assessed social determinant of health barriers;         Seizures  (Status: Goal on Track (progressing): YES.) Long Term Goal  Evaluation of current treatment plan related to  Seizures ,  and any seizure activity,  self-management and patient's adherence to plan as established by provider. Discussed plans with patient for ongoing care management follow up and provided patient with direct contact information for care management team Advised patient to report any seizure activity to PCP; Reviewed medications with patient and discussed importance of medication compliance; Reviewed scheduled/upcoming provider appointments including 09/18/2021 Echocardiogram; Social Work referral for Housing, utility payment and dental care providers; Pharmacy referral for complex medication regimen; Assessed social determinant of health barriers;  [name] reports no recent seizures.  Patient currently has all medications especially anti-seizure  medications   Hypertension: (Status: Goal on Track (progressing): YES.) Long Term Goal  Last practice recorded BP readings:  BP Readings from Last 3 Encounters:  09/05/21 128/78  08/28/21 132/78  07/11/21 128/74     Most recent eGFR/CrCl:  Lab Results  Component Value Date   EGFR 92 06/06/2021    No components found for: CRCL  Evaluation of current treatment plan related to hypertension self management and patient's adherence to plan as established by provider;   Reviewed prescribed diet Low Salt Heart Healthy Reviewed medications with patient and discussed importance of compliance;  Discussed plans with patient for ongoing care management follow up and provided patient with direct contact information for care management team; Reviewed scheduled/upcoming provider appointments including:  Assessed social determinant of health barriers;   Patient Goals/Self-Care Activities: Take medications as prescribed   Attend all scheduled provider appointments Call pharmacy for medication refills 3-7 days in advance of running out of medications Call provider office for new concerns or questions  Work with the social worker to address care coordination needs and will continue to work with the clinical team to address health care and disease management related needs       Follow Up:  Patient agrees to Care Plan and Follow-up.  Plan: The Managed Medicaid care management team will reach out to the patient again over the next 30 days.  Date/time of next scheduled RN care management/care coordination outreach: Oct 03, 2021 at 2:00 pm  Virgina Norfolk RN, BSN [facility] Coordinator [facility]  [facility] Mobile: [phone]

## 2021-09-06 DIAGNOSIS — R4189 Other symptoms and signs involving cognitive functions and awareness: Secondary | ICD-10-CM | POA: Insufficient documentation

## 2021-09-06 LAB — CBC
Hematocrit: 43.7 % (ref 37.5–51.0)
Hemoglobin: 13.9 g/dL (ref 13.0–17.7)
MCH: 24.9 pg — ABNORMAL LOW (ref 26.6–33.0)
MCHC: 31.8 g/dL (ref 31.5–35.7)
MCV: 78 fL — ABNORMAL LOW (ref 79–97)
Platelets: 268 10*3/uL (ref 150–450)
RBC: 5.59 x10E6/uL (ref 4.14–5.80)
RDW: 14.8 % (ref 11.6–15.4)
WBC: 7.7 10*3/uL (ref 3.4–10.8)

## 2021-09-06 LAB — CMP14 + ANION GAP
ALT: 25 IU/L (ref 0–44)
AST: 21 IU/L (ref 0–40)
Albumin/Globulin Ratio: 1.5 (ref 1.2–2.2)
Albumin: 4.7 g/dL (ref 3.8–4.9)
Alkaline Phosphatase: 114 IU/L (ref 44–121)
Anion Gap: 14 mmol/L (ref 10.0–18.0)
BUN/Creatinine Ratio: 16 (ref 9–20)
BUN: 20 mg/dL (ref 6–24)
Bilirubin Total: 0.4 mg/dL (ref 0.0–1.2)
CO2: 24 mmol/L (ref 20–29)
Calcium: 9.7 mg/dL (ref 8.7–10.2)
Chloride: 101 mmol/L (ref 96–106)
Creatinine, Ser: 1.29 mg/dL — ABNORMAL HIGH (ref 0.76–1.27)
Globulin, Total: 3.1 g/dL (ref 1.5–4.5)
Glucose: 94 mg/dL (ref 70–99)
Potassium: 4.4 mmol/L (ref 3.5–5.2)
Sodium: 139 mmol/L (ref 134–144)
Total Protein: 7.8 g/dL (ref 6.0–8.5)
eGFR: 64 mL/min/{1.73_m2} (ref >59)

## 2021-09-06 LAB — HEMOGLOBIN A1C
Est. average glucose Bld gHb Est-mCnc: 157 mg/dL
Hgb A1c MFr Bld: 7.1 % — ABNORMAL HIGH (ref 4.8–5.6)

## 2021-09-06 LAB — VITAMIN D 25 HYDROXY (VIT D DEFICIENCY, FRACTURES): Vit D, 25-Hydroxy: 13.6 ng/mL — ABNORMAL LOW (ref 30.0–100.0)

## 2021-09-06 LAB — TSH: TSH: 1.08 u[IU]/mL (ref 0.450–4.500)

## 2021-09-06 LAB — VITAMIN B12: Vitamin B-12: 269 pg/mL (ref 232–1245)

## 2021-09-06 NOTE — Assessment & Plan Note (Addendum)
Patient is presenting for evaluation of ongoing cognitive impairment. He is accompanied by his home health aid and wife is on the phone during this encounter. 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 Patient was loaded with keppra and fosphenytoin initially and required titration of several antiepileptics throughout the hospitalization including keppra, vimpat, phenytoin, perampanel and ativan prn by neurology. 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 His expressive aphasia was thought to be a combination of sedative agents and post-ictal state which could take weeks to months to recover. Patient was tapered off of phenytoin and perampanel and discharged to CIR. He was discharged from CIR on 10/28; however, was noted to have ongoing expressive aphasia and was recommended for follow up with speech therapy; however, has not been able to do so.  ?Today, patient is accompanied by his aid with wife on the phone. Wife expresses ongoing cognitive impairment with complete dependence in ADLs and iADLs. Patient requires assistance with bathing, dressing, feeding and medication administration. He continues to have expressive aphasia with memory impairment and has been noted to have personality changes. There have also been concerns about gait instability for which he uses a walker. No reported falls.  ?He has not been able to follow up with speech therapy following discharge. Mini-cognitive eval score 0 during this encounter with considerable expressive aphasia.  ?Patient's lab work is significant for vitamin D deficiency at 13.6; slightly worsening A1c to 7.1 (from 6.0 6 months prior), CMP with slightly elevated sCr to 1.29 (baseline ~0.96) without other  electrolyte abnormalities, and CBC with microcytosis without anemia. TSH and B12 wnl.  ? ?Plan: ?Start Vitamin D supplement ?Recommended increasing metformin to 1000mg  bid (see diabetes) ?Will repeat BMP and CBC at next visit, can consider iron studies if microcytosis is persistent  ?Recommended for follow up with neuro rehab - speech therapy ?Patient to continue Keppra and Vimpat - to follow up with neurology  ? ? ?ADDENDUM: ?Results and medication adjustments discussed with wife. Discussed recommendation to follow up with neuro-rehab and speech therapy. She reports that previously had to cancel appointments due to transportation issues and currently living in hotel. It has been difficult to get to the pharmacy to pick up prescriptions as well.  ?Will discuss with CCM if we have any further resources to help with transportation.  ?

## 2021-09-07 ENCOUNTER — Other Ambulatory Visit: Payer: Self-pay

## 2021-09-07 ENCOUNTER — Other Ambulatory Visit: Payer: Self-pay | Admitting: Psychiatry

## 2021-09-07 ENCOUNTER — Encounter: Payer: Self-pay | Admitting: Internal Medicine

## 2021-09-07 ENCOUNTER — Other Ambulatory Visit (HOSPITAL_COMMUNITY): Payer: Self-pay

## 2021-09-07 ENCOUNTER — Telehealth: Payer: Self-pay | Admitting: *Deleted

## 2021-09-07 ENCOUNTER — Other Ambulatory Visit: Payer: Self-pay | Admitting: Pharmacist

## 2021-09-07 DIAGNOSIS — R569 Unspecified convulsions: Secondary | ICD-10-CM

## 2021-09-07 DIAGNOSIS — I5020 Unspecified systolic (congestive) heart failure: Secondary | ICD-10-CM

## 2021-09-07 DIAGNOSIS — E559 Vitamin D deficiency, unspecified: Secondary | ICD-10-CM | POA: Insufficient documentation

## 2021-09-07 MED ORDER — LACOSAMIDE 200 MG PO TABS
200.0000 mg | ORAL_TABLET | Freq: Two times a day (BID) | ORAL | 3 refills | Status: DC
  Filled 2021-09-07: qty 60, 30d supply, fill #0
  Filled 2021-11-27 (×2): qty 60, 30d supply, fill #1

## 2021-09-07 MED ORDER — METFORMIN HCL 500 MG PO TABS
1000.0000 mg | ORAL_TABLET | Freq: Two times a day (BID) | ORAL | 1 refills | Status: DC
  Filled 2021-09-07 (×2): qty 360, 90d supply, fill #0
  Filled 2022-03-05: qty 360, 90d supply, fill #1

## 2021-09-07 MED ORDER — VITAMIN D3 25 MCG (1000 UNIT) PO TABS
ORAL_TABLET | ORAL | 3 refills | Status: AC
  Filled 2021-09-07: qty 100, 100d supply, fill #0

## 2021-09-07 MED ORDER — VITAMIN D (CHOLECALCIFEROL) 25 MCG (1000 UT) PO CAPS
1.0000 | ORAL_CAPSULE | Freq: Every day | ORAL | 3 refills | Status: AC
  Filled 2021-09-07: qty 90, fill #0

## 2021-09-07 NOTE — Telephone Encounter (Signed)
I sent a refill for the Vimpat to [facility], thanks

## 2021-09-07 NOTE — Telephone Encounter (Signed)
Received message from phone staff:  I have an Ambulatory pharmacist Katie Orie Fisherman)  seeing if Work in for Dr Marjory Lies (which I show is Dr Delena Bali) is willing to fill pt's lacosamide (VIMPAT) 200 MG TABS tablet .  She is unable to get the presciber to do so, Florentina Addison said [facility] is able to mail the Rx to the hotel pt is staying in if Work in will comply. I advised the medication was refilled on 09/05/21 by Dr Jennye Moccasin stated that provider is not liscened to write an Rx for that medication.  She is asking Dr Delena Bali to refill Vimpat and send Rx to [facility].  ?

## 2021-09-07 NOTE — Patient Instructions (Signed)
It was great to speak with you today! ? ?Transportation will pick you up to take you to [facility] on 4/27 at 9:10 am. You will get a lab called a "Basic Metabolic Panel" to follow up after starting the Entresto. They will pick you back up at 11 am. [place]ation number is [id number].  ? ?Visit Information ? ?Arthur [name] was given information about Medicaid Managed Care team care coordination services as a part of their [facility] Community Plan Medicaid benefit. [name] verbally consented to engagement with the [facility] Managed Care team.  ? ?If you are experiencing a medical emergency, please call 911 or report to your local emergency department or urgent care.  ? ?If you have a non-emergency medical problem during routine business hours, please contact your provider's office and ask to speak with a nurse.  ? ?For questions related to your [facility], please call: [phone] or visit the homepage here: [email or web address] ? ?If you would like to schedule transportation through your [facility], please call the following number at least 2 days in advance of your appointment: [phone]. ? Rides for urgent appointments can also be made after hours by calling Member Services. ? ? ? ?Catie Eppie Gibson, PharmD, BCACP ? Medical Group ?[phone] ? ?

## 2021-09-07 NOTE — Assessment & Plan Note (Signed)
Patient with worsening A1c to 7.0 (previously 6.0, 6 months ago). He has been on metformin 500mg  twice daily and has been tolerating well. At this time, will increase to max dosing as tolerated. ? ?Plan: ?Increase metformin to 1000mg  twice daily ?Referral to ophthalmology placed for diabetic eye exam ?Follow up in 3 months for repeat A1c  ?Annual foot exam at next visit  ?

## 2021-09-07 NOTE — Patient Outreach (Addendum)
? ?   ? ?Chief Complaint  ?Patient presents with  ? High Risk Managed Medicaid  ? ? ?Arthur [name] is a 61 y.o. year old male who was referred for medication management by their primary care provider, [name], MD. They presented for a telephone visit in the context of the COVID-19 pandemic. ?  ?They were referred to the pharmacist by their High Risk Managed Medicaid Care Team  for assistance in managing medication access.  ? ?Subjective: ? ?Care Team: ?Primary Care Provider: [name], MD ; Next Scheduled Visit: none, needs to be scheduled ?Cardiologist: Elvin So; Next Scheduled Visit: 11/27/21 ?Neurologist: Dr. Marjory Lies; Next Scheduled Visit: 01/23/22 ? ?Medication Access/Adherence ? ?Current Pharmacy:  ?[facility] ?[address], [address] ?[place] [address] ?Phone: [phone] Fax: [phone] ? ?[name] ?[address] ?[place] [address] ?Phone: [phone] Fax: [phone] ? ? ?Patient reports affordability concerns with their medications: Yes  ?Patient reports access/transportation concerns to their pharmacy: Yes  ?Patient reports adherence concerns with their medications:  Yes  does not have Vimpat, metformin, or Entresto right now. Awaiting update on whether PA for Sherryll Burger has been processed.  ? ?They are staying at an extended stay hotel right now. Do not have transportation to the pharmacy.  ? ? ?Diabetes: ? ?Current medications: prescribed metformin 1000 mg twice daily, but is out of this right now.  ? ? ?Heart Failure: ? ?Current medications:  ?ACEi/ARB/ARNI: Entresto 49/51 mg twice daily prescribed, patient does not have  ?SGLT2i: none ?Beta blocker: metoprolol tartrate 50 mg twice daily ?Mineralocorticoid Receptor Antagonist: spironolactone 25 mg daily - never started, plans to optimize Entresto first ?Diuretic regimen: none ?Additional therapy: hydralazine 50 mg three times daily and  isosorbide dinitrate 30 mg three times daily ? ?Seizures: ? ? ?Objective: ?Lab Results  ?Component Value Date  ? HGBA1C 7.1 (H) 09/05/2021  ? ? ?Lab Results  ?Component Value Date  ? CREATININE 1.29 (H) 09/05/2021  ? BUN 20 09/05/2021  ? NA 139 09/05/2021  ? K 4.4 09/05/2021  ? CL 101 09/05/2021  ? CO2 24 09/05/2021  ? ? ?Lab Results  ?Component Value Date  ? CHOL 147 03/23/2021  ? HDL 36 (L) 03/23/2021  ? LDLCALC 86 03/23/2021  ? LDLDIRECT 124.3 (H) 10/17/2020  ? TRIG 127 03/23/2021  ? CHOLHDL 4.1 03/23/2021  ? ? ?Medications Reviewed Today   ? ? Reviewed by Eliezer Bottom, MD (Resident) on 09/07/21 at [phone]  Med List Status: <None>  ? ?Medication Order Taking? Sig Documenting Provider Last Dose Status Informant  ?amLODipine (NORVASC) 10 MG tablet [id number] No Take 1 tablet (10 mg total) by mouth daily. [name], [name], PA-C Taking Active Spouse/Significant Other  ?aspirin EC 81 MG tablet [id number] No Take 1 tablet (81 mg total) by mouth daily. Swallow whole. [name], [name], PA-C Taking Active   ?atorvastatin (LIPITOR) 40 MG tablet [id number] No Take 1 tablet (40 mg total) by mouth daily. [name], [name], PA-C Taking Active Spouse/Significant Other  ?hydrALAZINE (APRESOLINE) 50 MG tablet [id number] No Take 1 tablet (50 mg total) by mouth every 8 (eight) hours. [name], [name], PA-C Taking Active Spouse/Significant Other  ?ibuprofen (ADVIL) 200 MG tablet [id number] No Take 200 mg by mouth every 8 (eight) hours as needed for moderate pain (Takes once or twice a day PRN). [provider] Taking Active Spouse/Significant Other  ?isosorbide dinitrate (ISORDIL) 30 MG tablet [id number] No Take  1 tablet (30 mg total) by mouth 3 (three) times daily. [name], [name], PA-C Taking Active Spouse/Significant Other  ?lacosamide (VIMPAT) 200 MG TABS tablet [id number]  Take 1 tablet (200 mg total) by mouth 2 (two) times daily. Eliezer Bottom, MD  Active   ?levETIRAcetam (KEPPRA) 750 MG tablet [id number]  Take 2  tablets (1,500 mg total) by mouth 2 (two) times daily. Eliezer Bottom, MD  Active   ?metFORMIN (GLUCOPHAGE) 500 MG tablet [id number]  Take 1 tablet (500 mg total) by mouth 2 (two) times daily with a meal. IM PROGRAM Aslam, Sadia, MD  Active   ?metoprolol tartrate (LOPRESSOR) 50 MG tablet [id number] No Take 1 tablet (50 mg total) by mouth 2 (two) times daily. [name], [name], PA-C Taking Active Spouse/Significant Other  ?sacubitril-valsartan (ENTRESTO) 49-51 MG [id number] No Take 1 tablet by mouth 2 (two) times daily. Start on 06/29/2021 Elder [name], MD Taking Active   ?         ?Med Note [name]   Tue Aug 22, 2021  3:45 PM)    ?spironolactone (ALDACTONE) 25 MG tablet [id number] No Take 0.5 tablets (12.5 mg total) by mouth daily. [name], [name], PA-C Taking Active   ?Vitamin D, Cholecalciferol, 25 MCG (1000 UT) CAPS [id number] Yes Take 1 tablet by mouth daily. Eliezer Bottom, MD  Active   ?Med List Note [name], CPhT 02/24/21 1756): Vimpat: "12/08/20 receiving PAP through [employer], approved until 12/08/2022"  ? ?  ?  ? ?  ? ? ?Assessment/Plan:  ?Care Plan : Medication Management  ?Updates made by Alden Hipp, RPH-CPP since 09/07/2021 12:00 AM  ?  ? ?Problem: Medication Access   ?  ? ?Long-Range Goal: Maintain Provider Appointments and Stay Adherent to Medications   ?Note:   ?Current Barriers:  ?Unable to independently afford treatment regimen ?Does not contact provider office for questions/concerns ?No transportation to pharmacy ? ?Patient Needs: ?Scheduled follow up with primary care provider ?Improved collaboration with dispensing pharmacy ?Transportation to pharmacy and appointments ? ?Patient Activities: ?Patient will:  ?- take medications as prescribed as evidenced by patient report and record review ?collaborate with provider on medication access solutions ? ? ? ?  ? ? ?Diabetes: ?- Currently uncontrolled due to lack of therapy access ?- Recommend SGLT2 moving forward  ?-  Collaborated with [facility] - they will be able to fill metformin and deliver tomorrow.  ? ?Heart Failure: ?- Currently with opportunity for optimization ?- Contacted pharmacy to follow up. Sherryll Burger ran through [employer]. They will fill and deliver.  ?- Reviewed with Candace that patient will need to have BMP completed ~ 7-10 days after initiation of Entresto. [facility] has patients go to [facility] for labs. Called [facility] on behalf of his provider, set up transportation on 4/27 at 9 am to transport patient to [facility] in [place] (closest to patient). They will pick up at 9:10 and be back to take home at 11 am, confirmation [id number].  BMP was previously placed by Elvin So ?- Called patient's wife. She notes they have an outstanding balance at [facility] so [facility] may not be able to draw labs. Will collaborate with cardiology and/or [facility]. ?- Recommend SGLT2 moving forward ? ?Seizure: ?- Currently does not have therapy  ?- Collaborated with retail pharmacy collegues. [name] will be able to deliver his medications, but they will need a new script for Vimpat given controlled status. Contacted [facility] as Dr. Marjory Lies appears  to be out of the office this week, they will ask covering neurologist to send the script. Script sent to [facility] and they will deliver tomorrow. ? ?Follow Up Plan: 2 weeks ? ?Catie Eppie Gibson, PharmD, BCACP ? Medical Group ?[phone] ? ? ? ?

## 2021-09-07 NOTE — Assessment & Plan Note (Signed)
Started on Vitamin D supplements 1000U daily.  ? ?Plan: ?Repeat vitamin D levels in 3 months ?

## 2021-09-07 NOTE — Addendum Note (Signed)
Addended by: Eliezer Bottom on: 09/07/2021 09:50 AM ? ? Modules accepted: Orders ? ?

## 2021-09-08 ENCOUNTER — Telehealth: Payer: Self-pay | Admitting: *Deleted

## 2021-09-08 ENCOUNTER — Other Ambulatory Visit: Payer: Self-pay | Admitting: Internal Medicine

## 2021-09-08 DIAGNOSIS — I5020 Unspecified systolic (congestive) heart failure: Secondary | ICD-10-CM

## 2021-09-08 NOTE — Telephone Encounter (Signed)
Called and spoke with [name] (wife) to let them know that his PCS forms had been faxed for renewal to [facility], [place]. Patient is to call me back with Mr. Brosius' nurse assessment appointment.  [facility] [phone] / [phone]. ?

## 2021-09-11 ENCOUNTER — Ambulatory Visit: Payer: Self-pay

## 2021-09-11 ENCOUNTER — Telehealth: Payer: Self-pay | Admitting: Pharmacist

## 2021-09-11 NOTE — Patient Outreach (Signed)
Received call back from [place].  ? ?Patient received the delivery from the pharmacy of metformin, Entresto, Vimpat, and Vitamin D. They started these medications on Saturday.  ? ?Reviewed that Dr. Dicky Doe ordered BMP to be checked ~ 7 days after starting Entresto. [name] will talk with the patient's aide and schedule a lab visit at [facility] on Friday or Monday, and either work with her to take patient or schedule transportation through [place].  ? ?Reviewed side effects of medications. Encouraged to monitor for symptoms of hypotension with Entresto. [name] notes the patient does have a BP machine, but it is packed up in storage right now.  ? ?She denies any other questions or concerns today. ? ?Will follow up in ~ 2 weeks as scheduled. ? ?Catie Eppie Gibson, PharmD, BCACP ?[facility] ?[phone] ? ?

## 2021-09-11 NOTE — Patient Outreach (Signed)
Care Coordination ? ?09/11/2021 ? ?[name] ?06/22/1960 ?[id number] ? ?Contacted patient's wife, [name], to review that Dr. [name] placed order for [employer] for patient to have checked at [facility] lab in ~ a week. Results will be sent to cardiology.  ? ?Left voicemail for patient's wife to return my call at her convenience.  ? ?Catie Eppie Gibson, PharmD, BCACP ?[facility] ?[phone] ? ?

## 2021-09-12 ENCOUNTER — Other Ambulatory Visit: Payer: Medicaid Other

## 2021-09-14 ENCOUNTER — Ambulatory Visit: Payer: Medicaid Other | Attending: Internal Medicine | Admitting: Speech Pathology

## 2021-09-14 DIAGNOSIS — R41841 Cognitive communication deficit: Secondary | ICD-10-CM | POA: Diagnosis present

## 2021-09-14 DIAGNOSIS — R4701 Aphasia: Secondary | ICD-10-CM | POA: Diagnosis present

## 2021-09-14 NOTE — Therapy (Signed)
?OUTPATIENT SPEECH LANGUAGE PATHOLOGY EVALUATION ? ? ?Patient Name: Arthur [name] ?MRN: [id number] ?DOB:01-02-1961, 61 y.o., male ?Today's Date: 09/15/2021 ? ?PCP: [name], MD ?REFERRING PROVIDER: Angelica Pou, MD  ? ? End of Session - 09/14/21 0955   ? ? Visit Number 1   ? Number of Visits 17   ? Date for SLP Re-Evaluation 12/07/21   ? Authorization Type Managed Medicaid   ? Authorization - Visit Number 3   will request initial 3  ? SLP Start Time 0930   ? SLP Stop Time  1013   ? SLP Time Calculation (min) 43 min   ? Activity Tolerance Patient tolerated treatment well   ? ?  ?  ? ?  ? ? ?Past Medical History:  ?Diagnosis Date  ? DM2 (diabetes mellitus, type 2) ([place])   ? ETOH abuse   ? Hypertension   ? Seizures ([place])   ? Transaminitis   ? ?Past Surgical History:  ?Procedure Laterality Date  ? HEMORRHOID SURGERY    ? INTRAVASCULAR PRESSURE WIRE/FFR STUDY N/A 06/27/2021  ? Procedure: INTRAVASCULAR PRESSURE WIRE/FFR STUDY;  Surgeon: Nigel Mormon, MD;  Location: [place] [facility];  Service: Cardiovascular;  Laterality: N/A;  ? LEFT HEART CATH AND CORONARY ANGIOGRAPHY N/A 06/27/2021  ? Procedure: LEFT HEART CATH AND CORONARY ANGIOGRAPHY;  Surgeon: Nigel Mormon, MD;  Location: [place] [facility];  Service: Cardiovascular;  Laterality: N/A;  ? ?Patient Active Problem List  ? Diagnosis Date Noted  ? Vitamin D deficiency 09/07/2021  ? Cognitive impairment 09/06/2021  ? Coronary artery disease   ? HFrEF (heart failure with reduced ejection fraction) ([place]) 05/10/2021  ? Encephalopathy acute 03/11/2021  ? Seizures ([place]) 02/24/2021  ? Aortic atherosclerosis ([place]) 11/22/2020  ? History of seizures 11/22/2020  ? Alcohol use disorder, severe, dependence ([place]) 11/22/2020  ? Type 2 diabetes mellitus ([place]) 11/22/2020  ? Anemia 11/22/2020  ? Hyperlipidemia 11/22/2020  ? Acute encephalopathy 10/16/2020  ? Essential hypertension 10/08/2019  ? History of CVA (cerebrovascular accident) 10/02/2019  ? ? ?ONSET DATE:  February 24, 2021  ? ?REFERRING DIAG: R56.9 (ICD-10-CM) - Seizures ([place]) Z86.73 (ICD-10-CM) - History of CVA (cerebrovascular accident)  ? ?THERAPY DIAG:  ?Cognitive communication deficit ? ?Aphasia ? ?SUBJECTIVE:  ? ?SUBJECTIVE STATEMENT: ?"I'm doing good" ?Pt accompanied by: significant other, Carmen ? ?PERTINENT HISTORY: Patient was admitted to hospital on 9/302022 with acute encephalopathy in setting of nonconvulsive status epilepticus. Pt admitted to CIR on 03/11/2021 and discharged on 10/28; however, was noted to have ongoing expressive aphasia. Wife expresses ongoing cognitive impairment with complete dependence in ADLs and iADLs. Patient requires assistance with bathing, dressing, feeding and medication administration. He continues to have expressive aphasia with memory impairment and has been noted to have personality changes. ? ?PAIN:  ?Are you having pain? No ? ? ?FALLS: Has patient fallen in last 6 months?  No ? ?LIVING ENVIRONMENT: ?Lives with: lives with their family ?Lives in: House/apartment ? ?PLOF:  ?Level of assistance: Independent with IADLs ?Employment: Other: unemployed ? ? ?PATIENT GOALS "do more things" ? ?OBJECTIVE:  ? ?DIAGNOSTIC FINDINGS: MR BRAIN WO CONTRAST 03/03/2021 FINDINGS: ?Brain: No acute infarction, hemorrhage, hydrocephalus, extra-axial ?collection or mass lesion. Remote lacunar infarcts in the bilateral ?corona radiata, thalami, basal ganglia region, left side of the pons ?and right cerebellar hemisphere. Scattered and confluent foci of T2 ?hyperintensity are seen within the white matter of the cerebral ?hemispheres and within the pons, nonspecific, most likely related to ?chronic small  vessel ischemia. Restricted diffusion within the left ?hippocampus seen on prior MRI has resolved head. However, there is ?persistent increased T2 signal, now with decreased volume and ?blurring of internal structures of the left hippocampus suggesting ?mesial temporal  sclerosis. ? ?COGNITION: ?Overall cognitive status: Impaired ?Attention: Impaired: Focused, Sustained, Selective, Alternating, Divided ?Memory: Impaired: Working ?Short term ?Long term ?Prospective ?Auditory ?Awareness: Impaired: Anticipatory ?Executive function: Impaired: Problem solving, Organization, Planning, Self-correction, and Slow processing ?Behavior: Within functional limits ?Functional deficits: inability to work, significantly impacted participation in preferred activities, requires max-A in managing IADLs ? ?COGNITIVE COMMUNICATION ?Following directions: Follows one step commands consistently  ?Auditory comprehension: Impaired: frequently asking for repetitions, even with simple instructions or sentences, appears to have cognitive component ?Verbal expression: Impaired: expressive aphasia, to be further assessed later session ?Functional communication: Impaired: naming, answering questions, orientation, describing, providing details ? ?STANDARDIZED ASSESSMENTS: ?CLQT initiated this date. Scores as follows: ?Personal Facts: 6/8 - not oriented to age or city ?Symbol Cancellation: 0/12 - refers back to example, however unable to recall instructions and only cancels one symbol incorrectly ?Confrontation Naming: 6/10 - paraphasias present, pt with inconsistent awareness of errors  ?Generative Naming: 2/9 - repeats instructions, limited ability to generate. More success on animals (6) vs. "m" (1) ?Clock Drawing: 7/13 - draws 12 numbers in circular pattern, not correctly spaced, unable to complete hands, emergent awareness, asking "is that right?"  ? ? PATIENT REPORTED OUTCOME MEASURES (PROM): ?To be administered 1st session ? ? ?TODAY'S TREATMENT:  ?Education on evaluation results, ST observations and recommendations, POC. Initiated training on external compensations pt and wife can implement for memory (pill box, calendar)  ? ? ?PATIENT EDUCATION: ?Education details: see above ?Person educated: Patient and  Spouse ?Education method: Explanation, Demonstration, and Handouts ?Education comprehension: verbalized understanding, returned demonstration, and needs further education ? ? ? ? ?GOALS: ?Goals reviewed with patient? Yes ? ?SHORT TERM GOALS: Target date: 10/13/2021 ? ?Pt will complete standardized assessment for cognition, language and PROM within first two therapy sessions ?Baseline: initiated CLQT ?Goal status: INITIAL ? ?2.  Pt will complete structured language tasks (e.g. VNeST, SFA, RET) with 80% accuracy, with mod-A over 2 sessions ?Baseline: language score on CLQT (15) indicates significant impairment  ?Goal status: INITIAL ? ?3.  Pt's spouse/caregiver/care partner will provide appropriate cues to assist pt with verbal output given occasional min A over 2 sessions ?Baseline: caregiver has not received training on types of cues or cueing hierarchy  ?Goal status: INITIAL ? ?4.  Pt will verbalize and implement 2 attention and memory compensations to aid daily functioning given occasional min A over 2 sessions ?Baseline: none being used ?Goal status: INITIAL ? ? ?LONG TERM GOALS: Target date: 12/08/2021 ? ?Pt will report subjective improvement in communication abilities via PROM by d/c  ?Baseline: PROM to be administered first therapy session ?Goal status: INITIAL ? ?2.  Pt and spouse will report compliance with HEP targeting expressive language with mod-I over 1 week period  ?Baseline: HEP to be established ?Goal status: INITIAL ? ?3.  Pt and spouse will report improved participation in x2 preferred activities as home with use of trained compensations over 1 week period ?Baseline: report pt is not participating in preferred activities or household chores, limited to sitting on porch and watching TV ?Goal status: INITIAL ? ?4.  Pt will carryover 2 stratregies for memory to recall conversations and pertinent events with family over 1 week ?Baseline:  ?Goal status: INITIAL ? ? ?ASSESSMENT: ? ?CLINICAL  IMPRESSION: ?Patient  is a 61 y.o. M who was seen today for cognitive communication deficits s/p CVA + seizure. Pt attends session with wife, [name], who assists pt in providing history.  Pt and wife report significant decline in participation o

## 2021-09-18 ENCOUNTER — Other Ambulatory Visit: Payer: Medicaid Other

## 2021-09-19 ENCOUNTER — Other Ambulatory Visit: Payer: Medicaid Other

## 2021-09-20 ENCOUNTER — Ambulatory Visit: Payer: Medicaid Other

## 2021-09-20 NOTE — Progress Notes (Signed)
Internal Medicine Clinic Attending ° °Case discussed with Dr. Aslam  At the time of the visit.  We reviewed the resident’s history and exam and pertinent patient test results.  I agree with the assessment, diagnosis, and plan of care documented in the resident’s note.  °

## 2021-09-21 ENCOUNTER — Other Ambulatory Visit: Payer: Self-pay | Admitting: Pharmacist

## 2021-09-21 NOTE — Patient Instructions (Signed)
It was great to speak with you today! ? ?1) Call [facility] to reschedule the "echocardiogram", and then schedule transportation ?2) Call Internal Medicine Clinic to reschedule lab appointment, and then schedule transportation ?3) Call about speech therapy and upcoming appointments.  ? ?Let me know if you have any questions or concerns! ? ?Catie Eppie Gibson, PharmD, BCACP ?[facility] ?[phone] ? ? ?Visit Information ? ?Mr. Stoutenburg was given information about Medicaid Managed Care team care coordination services as a part of their [facility] Community Plan Medicaid benefit. [name] verbally consented to engagement with the [facility] Managed Care team.  ? ?If you are experiencing a medical emergency, please call 911 or report to your local emergency department or urgent care.  ? ?If you have a non-emergency medical problem during routine business hours, please contact your provider's office and ask to speak with a nurse.  ? ?For questions related to your [facility], please call: [phone] or visit the homepage here: [email or web address] ? ?If you would like to schedule transportation through your [facility], please call the following number at least 2 days in advance of your appointment: [phone]. ? Rides for urgent appointments can also be made after hours by calling Member Services. ? ? ? ? ?

## 2021-09-21 NOTE — Patient Outreach (Signed)
? ?   ? ?Chief Complaint  ?Patient presents with  ? High Risk Managed Medicaid  ? ? ?Arthur [name] is a 61 y.o. year old male who was referred for medication management by their primary care provider, [name], MD. They presented for a telephone visit. ?  ?They were referred to the pharmacist by their High Risk Managed Medicaid Care Team  for assistance in managing medication access and complex medication management.  ? ?Subjective: ? ?Care Team: ?Primary Care Provider: [name], MD ; Next Scheduled Visit: needs to be scheduled ?Cardiologist: Elvin So; Next Scheduled Visit: 11/27/21 ?Neurology: Penumalli; Next Scheduled Visit: 01/23/22 ? ?Medication Access/Adherence ? ?Current Pharmacy:  ?[facility] ?[address], [address] ?[place] [address] ?Phone: [phone] Fax: [phone] ? ?[name] ?[address] ?[place] [address] ?Phone: [phone] Fax: [phone] ? ?[name] ?[address] ?[place] [address] ?Phone: [phone] Fax: [phone] ? ? ?Patient reports affordability concerns with their medications: No  ?Patient reports access/transportation concerns to their pharmacy: No  ?Patient reports adherence concerns with their medications:  No   ? ? ?Heart Failure: ? ?Current medications:  ?ACEi/ARB/ARNI: Entresto 49/51 mg twice daily ?SGLT2i: None, consider moving forward ?Beta blocker: metoprolol tartrate 50 mg twice daily ?Mineralocorticoid Receptor Antagonist: consider moving forward ?Diuretic regimen: none ?Additionally on hydralazine 50 mg and isosorbide dintrate 30 mg three times daily, amlodipine 10 mg daily ? ?Current home blood pressure readings: none, needs BP machien ? ?Seizure ?Current medications: locosamide 200 mg twice daily, levetiracetam 1500 mg twice daily ? ?Hyperlipidemia/ASCVD Risk Reduction ? ?Current lipid lowering medications: atorvastatin 40 mg  daily ? ? ?Antiplatelet regimen: aspirin 81 mg daily  ? ?Diabetes: ? ?Current medications: metformin 1000 mg twice daily ? ? ?Health Maintenance ? ?Health Maintenance Due  ?Topic Date Due  ? FOOT EXAM  Never done  ? OPHTHALMOLOGY EXAM  Never done  ? TETANUS/TDAP  Never done  ? COLONOSCOPY (Pts 45-33yrs Insurance coverage will need to be confirmed)  Never done  ? Zoster Vaccines- Shingrix (1 of 2) Never done  ? COVID-19 Vaccine (2 - Pfizer series) 03/01/2020  ?  ? ?Objective: ?Lab Results  ?Component Value Date  ? HGBA1C 7.1 (H) 09/05/2021  ? ? ?Lab Results  ?Component Value Date  ? CREATININE 1.29 (H) 09/05/2021  ? BUN 20 09/05/2021  ? NA 139 09/05/2021  ? K 4.4 09/05/2021  ? CL 101 09/05/2021  ? CO2 24 09/05/2021  ? ? ?Lab Results  ?Component Value Date  ? CHOL 147 03/23/2021  ? HDL 36 (L) 03/23/2021  ? LDLCALC 86 03/23/2021  ? LDLDIRECT 124.3 (H) 10/17/2020  ? TRIG 127 03/23/2021  ? CHOLHDL 4.1 03/23/2021  ? ? ?Medications Reviewed Today   ? ? Reviewed by Alden Hipp, RPH-CPP (Pharmacist) on 09/21/21 at [phone]  Med List Status: <None>  ? ?Medication Order Taking? Sig Documenting Provider Last Dose Status Informant  ?amLODipine (NORVASC) 10 MG tablet [id number] Yes Take 1 tablet (10 mg total) by mouth daily. [name], [name], PA-C Taking Active Spouse/Significant Other  ?aspirin EC 81 MG tablet [id number] Yes Take 1 tablet (81 mg total) by mouth daily. Swallow whole. [name], [name], PA-C Taking Active   ?atorvastatin (LIPITOR) 40 MG tablet [id number] Yes Take 1 tablet (40 mg total) by mouth daily. [name], [name], PA-C Taking Active Spouse/Significant Other  ?cholecalciferol (VITAMIN D) 25 MCG (1000 UNIT) tablet  [id number] Yes Take by mouth.  Taking Active   ?hydrALAZINE (APRESOLINE) 50 MG tablet [id number] Yes Take 1 tablet (50 mg total) by mouth every 8 (eight) hours. [name], [name], PA-C Taking Active Spouse/Significant Other  ?isosorbide dinitrate (ISORDIL) 30 MG tablet [id number] Yes Take 1  tablet (30 mg total) by mouth 3 (three) times daily. [name], [name], PA-C Taking Active Spouse/Significant Other  ?lacosamide (VIMPAT) 200 MG TABS tablet [id number] Yes Take 1 tablet (200 mg total) by mouth 2 (two) times daily. Ocie Doyne, MD Taking Active   ?levETIRAcetam (KEPPRA) 750 MG tablet [id number] Yes Take 2 tablets (1,500 mg total) by mouth 2 (two) times daily. Eliezer Bottom, MD Taking Active   ?metFORMIN (GLUCOPHAGE) 500 MG tablet [id number] Yes Take 2 tablets (1,000 mg total) by mouth 2 (two) times daily with a meal. Aslam, Sadia, MD Taking Active   ?metoprolol tartrate (LOPRESSOR) 50 MG tablet [id number] Yes Take 1 tablet (50 mg total) by mouth 2 (two) times daily. [name], [name], PA-C Taking Active Spouse/Significant Other  ?sacubitril-valsartan (ENTRESTO) 49-51 MG [id number] Yes Take 1 tablet by mouth 2 (two) times daily. Start on 06/29/2021 Elder [name], MD Taking Active   ?         ?Med Note [name]   Tue Aug 22, 2021  3:45 PM)    ?Vitamin D, Cholecalciferol, 25 MCG (1000 UT) CAPS [id number]  Take 1 tablet by mouth daily. Eliezer Bottom, MD  Active   ?Med List Note [name], CPhT 02/24/21 1756): [place]: "12/08/20 receiving PAP through [employer], approved until 12/08/2022"  ? ?  ?  ? ?  ? ? ?Assessment/Plan:  ?Care Plan : Medication Management  ?Updates made by Alden Hipp, RPH-CPP since 09/21/2021 12:00 AM  ?  ? ?Problem: Medication Access   ?  ? ?Long-Range Goal: Maintain Provider Appointments and Stay Adherent to Medications   ?Note:   ?Current Barriers:  ?Unable to independently afford treatment regimen ?Does not contact provider office for questions/concerns ?No transportation to pharmacy ? ?Patient Needs: ?Scheduled follow up with primary care provider ?Improved collaboration with dispensing pharmacy ?Transportation to pharmacy and appointments ? ?Patient Activities: ?Patient will:  ?- Call [facility] to reschedule ECHO ?- Call Internal Medicine  Clinic to reschedule lab appointment ?- Call SLP to reschedule tomorrow's appointment ?- Set up transportation ?- Download and activate MyChart ? ? ?  ? ? ?Diabetes: ?- Currently controlled ?- Recommend to consider addition of SGLT2 moving forward due to HF. See below.  ? ? ?Hyperlipidemia/ASCVD Risk Reduction: ?- Currently uncontrolled.  ?- Recommend recheck lipids with next lab work now that medication access has been addressed ? ?Heart Failure: ?- Currently with opportunity for optimization ?- Patient will reschedule lab appointment and schedule transportation for f/u BMP.  ?- Recommend continued work towards therapy optimization - reduction/elimination of amlodipine, hydral/isosorbide in favor of optimization of ARNI, addition of spironolactone, change to guideline directed beta blocker such as carvedilol or metoprolol succinate, and addition of SGLT2.  Will continue to collaborate with cardiology.  ? ?Seizure Disorder: ?- Currently controlled ?- Recommended to continue current regimen at this time ? ? ?Follow Up Plan: telephone call in 4 weeks ? ?Catie Eppie Gibson, PharmD, BCACP ?[facility] ?[phone] ? ? ? ?

## 2021-09-22 ENCOUNTER — Ambulatory Visit: Payer: Medicaid Other | Admitting: Speech Pathology

## 2021-09-22 DIAGNOSIS — R4189 Other symptoms and signs involving cognitive functions and awareness: Secondary | ICD-10-CM | POA: Diagnosis not present

## 2021-09-27 ENCOUNTER — Ambulatory Visit: Payer: Medicaid Other | Attending: Internal Medicine | Admitting: Speech Pathology

## 2021-09-27 ENCOUNTER — Telehealth: Payer: Self-pay | Admitting: Speech Pathology

## 2021-09-27 DIAGNOSIS — R4701 Aphasia: Secondary | ICD-10-CM | POA: Insufficient documentation

## 2021-09-27 DIAGNOSIS — R4189 Other symptoms and signs involving cognitive functions and awareness: Secondary | ICD-10-CM | POA: Diagnosis not present

## 2021-09-27 DIAGNOSIS — R41841 Cognitive communication deficit: Secondary | ICD-10-CM | POA: Insufficient documentation

## 2021-09-27 NOTE — Therapy (Deleted)
OUTPATIENT SPEECH LANGUAGE PATHOLOGY TREATMENT NOTE   Patient Name: Arthur [name] MRN: [id number] DOB:1960-08-27, 61 y.o., male Today's Date: 09/27/2021  PCP: [name], MD REFERRING PROVIDER: Angelica Pou, MD   END OF SESSION:    Past Medical History:  Diagnosis Date   DM2 (diabetes mellitus, type 2) ([place])    ETOH abuse    Hypertension    Seizures ([place])    Transaminitis    Past Surgical History:  Procedure Laterality Date   HEMORRHOID SURGERY     INTRAVASCULAR PRESSURE WIRE/FFR STUDY N/A 06/27/2021   Procedure: INTRAVASCULAR PRESSURE WIRE/FFR STUDY;  Surgeon: Nigel Mormon, MD;  Location: [facility];  Service: Cardiovascular;  Laterality: N/A;   LEFT HEART CATH AND CORONARY ANGIOGRAPHY N/A 06/27/2021   Procedure: LEFT HEART CATH AND CORONARY ANGIOGRAPHY;  Surgeon: Nigel Mormon, MD;  Location: [facility];  Service: Cardiovascular;  Laterality: N/A;   Patient Active Problem List   Diagnosis Date Noted   Vitamin D deficiency 09/07/2021   Cognitive impairment 09/06/2021   Coronary artery disease    HFrEF (heart failure with reduced ejection fraction) ([place]) 05/10/2021   Encephalopathy acute 03/11/2021   Seizures ([place]) 02/24/2021   Aortic atherosclerosis ([place]) 11/22/2020   History of seizures 11/22/2020   Alcohol use disorder, severe, dependence ([place]) 11/22/2020   Type 2 diabetes mellitus ([place]) 11/22/2020   Anemia 11/22/2020   Hyperlipidemia 11/22/2020   Acute encephalopathy 10/16/2020   Essential hypertension 10/08/2019   History of CVA (cerebrovascular accident) 10/02/2019    ONSET DATE: February 24, 2021    REFERRING DIAG: R56.9 (ICD-10-CM) - Seizures ([place]) Z86.73 (ICD-10-CM) - History of CVA (cerebrovascular accident)   THERAPY DIAG:  No diagnosis found.  SUBJECTIVE: ***  PAIN:  Are you having pain? {yes/no:20286} NPRS scale: ***/10 Pain location: *** Pain orientation: {Pain Orientation:25161}  PAIN TYPE:  {type:313116} Pain description: {PAIN DESCRIPTION:21022940}  Aggravating factors: *** Relieving factors: ***    OBJECTIVE:    TODAY'S TREATMENT:  Education on evaluation results, ST observations and recommendations, POC. Initiated training on external compensations pt and wife can implement for memory (pill box, calendar)      PATIENT EDUCATION: Education details: see above Person educated: Patient and Spouse Education method: Explanation, Demonstration, and Handouts Education comprehension: verbalized understanding, returned demonstration, and needs further education         GOALS: Goals reviewed with patient? Yes   SHORT TERM GOALS: Target date: 10/13/2021   Pt will complete standardized assessment for cognition, language and PROM within first two therapy sessions Baseline: initiated CLQT Goal status: INITIAL   2.  Pt will complete structured language tasks (e.g. VNeST, SFA, RET) with 80% accuracy, with mod-A over 2 sessions Baseline: language score on CLQT (15) indicates significant impairment  Goal status: INITIAL   3.  Pt's spouse/caregiver/care partner will provide appropriate cues to assist pt with verbal output given occasional min A over 2 sessions Baseline: caregiver has not received training on types of cues or cueing hierarchy  Goal status: INITIAL   4.  Pt will verbalize and implement 2 attention and memory compensations to aid daily functioning given occasional min A over 2 sessions Baseline: none being used Goal status: INITIAL     LONG TERM GOALS: Target date: 12/08/2021   Pt will report subjective improvement in communication abilities via PROM by d/c  Baseline: PROM to be administered first therapy session Goal status: INITIAL   2.  Pt and spouse will report compliance with  HEP targeting expressive language with mod-I over 1 week period  Baseline: HEP to be established Goal status: INITIAL   3.  Pt and spouse will report improved participation in  x2 preferred activities as home with use of trained compensations over 1 week period Baseline: report pt is not participating in preferred activities or household chores, limited to sitting on porch and watching TV Goal status: INITIAL   4.  Pt will carryover 2 stratregies for memory to recall conversations and pertinent events with family over 1 week Baseline:  Goal status: INITIAL     ASSESSMENT:   CLINICAL IMPRESSION: Patient is a 61 y.o. M who was seen today for cognitive communication deficits s/p CVA + seizure. Pt attends session with wife, Arthur [name], who assists pt in providing history.  Pt and wife report significant decline in participation of preferred activities. Primary concerns are pt's inability to work/cook/manage schedule, finances, or medications, his impaired memory, and lack of participation in household chores or other important activities. Currently, pt enjoys going outside and watching the news but is unable to recall any of what he watches or relay pertinent information in conversations with ST or family member. Wife reports pt asking same questions repeatedly, frequent occurences of word finding difficulties. Observed numerous times over evaluation. Reliant on wife for operating cell phone to make calls. Reportedly is forgetting names of familiar people. Further expressive language assessment is indicated. ST initates CLQT assessment this date to appreciate pt's cognitive strengths and needs. Pt with impaired attention, memory, executive function skills. Language domain sub-tests completed with score of 15, indicates severe impairment. ST notes that pt has difficulty attending to verbally presented information, decreased recall of instructions even with visual model left during task (e.g. symbol cancellation). Executive function skills of problem solving, organization, and planning impairment observed in clock drawing task. I recommend skilled ST to address expressive language and  cognitive deficits.    OBJECTIVE IMPAIRMENTS include attention, memory, awareness, executive functioning, expressive language, and aphasia. These impairments are limiting patient from return to work, managing medications, managing appointments, managing finances, household responsibilities, ADLs/IADLs, and effectively communicating at home and in community. Factors affecting potential to achieve goals and functional outcome are ability to learn/carryover information and severity of impairments.. Patient will benefit from skilled SLP services to address above impairments and improve overall function.   REHAB POTENTIAL: Fair dependent on participation in [place], frequency at which pt can attend therapy   PLAN: SLP FREQUENCY: 2x/week   SLP DURATION: 12 weeks   PLANNED INTERVENTIONS: Language facilitation, Environmental controls, Cueing hierachy, Cognitive reorganization, Internal/external aids, Functional tasks, Multimodal communication approach, SLP instruction and feedback, Compensatory strategies, and Patient/family education  Su Monks, [place] 09/27/2021, 7:57 AM

## 2021-09-27 NOTE — Telephone Encounter (Signed)
Attempt to contact patient or care partner, Arthur [name], via telephone d/t 2nd no show visit since initial ST evaluation. ST left VM relaying time of missed visit, clinics no-show policy, and reminding pt of next appointment scheduled for 09/29/21 at 9:30. Advised that all future visits will be cancelled should pt have subsequent no-show.  ?

## 2021-09-28 DIAGNOSIS — R4189 Other symptoms and signs involving cognitive functions and awareness: Secondary | ICD-10-CM | POA: Diagnosis not present

## 2021-09-29 ENCOUNTER — Ambulatory Visit: Payer: Medicaid Other | Admitting: Speech Pathology

## 2021-09-29 ENCOUNTER — Other Ambulatory Visit (HOSPITAL_COMMUNITY): Payer: Self-pay

## 2021-09-29 DIAGNOSIS — R41841 Cognitive communication deficit: Secondary | ICD-10-CM

## 2021-09-29 DIAGNOSIS — R4701 Aphasia: Secondary | ICD-10-CM

## 2021-09-29 NOTE — Patient Instructions (Addendum)
Orientation to current day: ? ?Every morning, reference home calendar. Practice finding the current day and saying the date. ? ?Date + Month + Year ? ?Choose a time that works to cross of days past: In morning you can cross of previous day or at night cross of that day.  ? ? ?Arthur [name], please help Arthur [name] in establishing this daily routine.  ? ?Write in appointments from sheet I gave you so when you practice this you can also see what is going on that day.  ? ? ?

## 2021-09-29 NOTE — Therapy (Signed)
?OUTPATIENT SPEECH LANGUAGE PATHOLOGY TREATMENT NOTE ? ? ?Patient Name: Arthur [name] ?MRN: [id number] ?DOB:17-May-1961, 61 y.o., male ?Today's Date: 09/29/2021 ? ?PCP: [name], MD ?REFERRING PROVIDER: Angelica Pou, MD  ? ?END OF SESSION:  ? End of Session - 09/29/21 0951   ? ? Visit Number 2   ? Number of Visits 17   ? Date for SLP Re-Evaluation 12/07/21   ? Authorization Type Managed Medicaid   ? Authorization - Visit Number 3   will request initial 3  ? SLP Start Time [phone]   arrived late d/t transportation  ? SLP Stop Time  1015   ? SLP Time Calculation (min) 24 min   ? Activity Tolerance Patient tolerated treatment well   ? ?  ?  ? ?  ? ? ?Past Medical History:  ?Diagnosis Date  ? DM2 (diabetes mellitus, type 2) ([place])   ? ETOH abuse   ? Hypertension   ? Seizures ([place])   ? Transaminitis   ? ?Past Surgical History:  ?Procedure Laterality Date  ? HEMORRHOID SURGERY    ? INTRAVASCULAR PRESSURE WIRE/FFR STUDY N/A 06/27/2021  ? Procedure: INTRAVASCULAR PRESSURE WIRE/FFR STUDY;  Surgeon: Nigel Mormon, MD;  Location: [facility];  Service: Cardiovascular;  Laterality: N/A;  ? LEFT HEART CATH AND CORONARY ANGIOGRAPHY N/A 06/27/2021  ? Procedure: LEFT HEART CATH AND CORONARY ANGIOGRAPHY;  Surgeon: Nigel Mormon, MD;  Location: [facility];  Service: Cardiovascular;  Laterality: N/A;  ? ?Patient Active Problem List  ? Diagnosis Date Noted  ? Vitamin D deficiency 09/07/2021  ? Cognitive impairment 09/06/2021  ? Coronary artery disease   ? HFrEF (heart failure with reduced ejection fraction) ([place]) 05/10/2021  ? Encephalopathy acute 03/11/2021  ? Seizures ([place]) 02/24/2021  ? Aortic atherosclerosis ([place]) 11/22/2020  ? History of seizures 11/22/2020  ? Alcohol use disorder, severe, dependence ([place]) 11/22/2020  ? Type 2 diabetes mellitus ([place]) 11/22/2020  ? Anemia 11/22/2020  ? Hyperlipidemia 11/22/2020  ? Acute encephalopathy 10/16/2020  ? Essential hypertension 10/08/2019  ? History of CVA  (cerebrovascular accident) 10/02/2019  ? ? ?ONSET DATE: February 24, 2021  ?  ?REFERRING DIAG: R56.9 (ICD-10-CM) - Seizures ([place]) Z86.73 (ICD-10-CM) - History of CVA (cerebrovascular accident) ? ?THERAPY DIAG:  ?Aphasia ? ?Cognitive communication deficit ? ?SUBJECTIVE: "we got a new house" ? ?PAIN:  ?Are you having pain? No ?OBJECTIVE:  ?TODAY'S TREATMENT:  ?09-29-2021: Pt arrives late to session, reports d/t transportation. Advised pt of need to attend therapy appointments regularly to make progress towards therapy goals. Target orientation through use of calendar with days marked off. Requires max-A to relay date despite visual cues. Errorless learning and spaced retrieval up to 3 minute delay to accurately ID today's date. Provided written instructions on implementing orientation routine for home. Target verbal expression in simple naming task. Able to ID x4 verbs then list x3 related items for each with usual max-A. Usual auditory comprehension impairment observed. Usual apraxia errors during verbal expression. Benefits from segmentation and direct model.  ?  ?  ?PATIENT EDUCATION: ?Education details: see above ?Person educated: Patient and Spouse ?Education method: Explanation, Demonstration, and Handouts ?Education comprehension: verbalized understanding, returned demonstration, and needs further education ?  ?  ?  ?  ?GOALS: ?Goals reviewed with patient? Yes ?  ?SHORT TERM GOALS: Target date: 10/13/2021 ?  ?Pt will complete standardized assessment for cognition, language and PROM within first two therapy sessions ?Baseline: initiated CLQT ?Goal status: ongoing ?  ?  2.  Pt will complete structured language tasks (e.g. VNeST, SFA, RET) with 80% accuracy, with mod-A over 2 sessions ?Baseline: language score on CLQT (15) indicates significant impairment  ?Goal status: ongoing ?  ?3.  Pt's spouse/caregiver/care partner will provide appropriate cues to assist pt with verbal output given occasional min A over 2  sessions ?Baseline: caregiver has not received training on types of cues or cueing hierarchy  ?Goal status: ongoing ?  ?4.  Pt will verbalize and implement 2 attention and memory compensations to aid daily functioning given occasional min A over 2 sessions ?Baseline: none being used ?Goal status: ongoing ?  ?  ?LONG TERM GOALS: Target date: 12/08/2021 ?  ?Pt will report subjective improvement in communication abilities via PROM by d/c  ?Baseline: PROM to be administered first therapy session ?Goal status: ongoing ?  ?2.  Pt and spouse will report compliance with HEP targeting expressive language with mod-I over 1 week period  ?Baseline: HEP to be established ?Goal status: ongoing ?  ?3.  Pt and spouse will report improved participation in x2 preferred activities as home with use of trained compensations over 1 week period ?Baseline: report pt is not participating in preferred activities or household chores, limited to sitting on porch and watching TV ?Goal status: ongoing ?  ?4.  Pt will carryover 2 stratregies for memory to recall conversations and pertinent events with family over 1 week ?Baseline:  ?Goal status: ongoing ?  ?  ?ASSESSMENT: ?  ?CLINICAL IMPRESSION: ?Patient is a 61 y.o. M who was seen today for cognitive communication deficits s/p CVA + seizure. Pt attends session with wife, Arthur [name], who assists pt in providing history.  Pt and wife report significant decline in participation of preferred activities. Primary concerns are pt's inability to work/cook/manage schedule, finances, or medications, his impaired memory, and lack of participation in household chores or other important activities. Currently, pt enjoys going outside and watching the news but is unable to recall any of what he watches or relay pertinent information in conversations with ST or family member. Wife reports pt asking same questions repeatedly, frequent occurences of word finding difficulties. Observed numerous times over evaluation.  Reliant on wife for operating cell phone to make calls. Reportedly is forgetting names of familiar people. Further expressive language assessment is indicated. ST initates CLQT assessment this date to appreciate pt's cognitive strengths and needs. Pt with impaired attention, memory, executive function skills. Language domain sub-tests completed with score of 15, indicates severe impairment. ST notes that pt has difficulty attending to verbally presented information, decreased recall of instructions even with visual model left during task (e.g. symbol cancellation). Executive function skills of problem solving, organization, and planning impairment observed in clock drawing task. I recommend skilled ST to address expressive language and cognitive deficits.  ?  ?OBJECTIVE IMPAIRMENTS include attention, memory, awareness, executive functioning, expressive language, and aphasia. These impairments are limiting patient from return to work, managing medications, managing appointments, managing finances, household responsibilities, ADLs/IADLs, and effectively communicating at home and in community. ?Factors affecting potential to achieve goals and functional outcome are ability to learn/carryover information and severity of impairments.. Patient will benefit from skilled SLP services to address above impairments and improve overall function. ?  ?REHAB POTENTIAL: Fair dependent on participation in HEP, frequency at which pt can attend therapy ?  ?PLAN: ?SLP FREQUENCY: 2x/week ?  ?SLP DURATION: 12 weeks ?  ?PLANNED INTERVENTIONS: Language facilitation, Environmental controls, Cueing hierachy, Cognitive reorganization, Internal/external aids, Functional tasks, Multimodal  communication approach, SLP instruction and feedback, Compensatory strategies, and Patient/family education ? ?Su Monks, CCC-SLP ?09/29/2021, 11:23 AM ?  ?

## 2021-10-02 DIAGNOSIS — R4189 Other symptoms and signs involving cognitive functions and awareness: Secondary | ICD-10-CM | POA: Diagnosis not present

## 2021-10-03 ENCOUNTER — Ambulatory Visit: Payer: Medicaid Other | Admitting: Speech Pathology

## 2021-10-03 ENCOUNTER — Other Ambulatory Visit: Payer: Self-pay

## 2021-10-03 NOTE — Patient Instructions (Signed)
Visit Information ? ?Arthur [name] was given information about Medicaid Managed Care team care coordination services as a part of their [facility] Community Plan Medicaid benefit. Arthur [name]; wife/primary caregiver, Arthur [name], verbally consented to engagement with the [facility] Managed Care team.  ? ?If you are experiencing a medical emergency, please call 911 or report to your local emergency department or urgent care.  ? ?If you have a non-emergency medical problem during routine business hours, please contact your provider's office and ask to speak with a nurse.  ? ?For questions related to your [facility], please call: [phone] or visit the homepage here: [email or web address] ? ?If you would like to schedule transportation through your [facility], please call the following number at least 2 days in advance of your appointment: [phone]. ? Rides for urgent appointments can also be made after hours by calling Member Services. ? ?Call the Behavioral Health Crisis Line at [phone], at any time, 24 hours a day, 7 days a week. If you are in danger or need immediate medical attention call 911. ? ?If you would like help to quit smoking, call 1-800-QUIT-NOW (442-365-6255) OR Espa?ol: 1-855-D?jelo-Ya 816-607-2824) o para m?s informaci?n haga clic aqu? or Text READY to 200-400 to register via text ? ?Arthur [name] - following are the goals we discussed in your visit today: Please see Patient Goals in the RN Care Manager Plan of Care below. ? ?Please see education materials related to today's visit provided by MyChart link. ? ?Patient verbalizes understanding of instructions and care plan provided today and agrees to view in MyChart. Active MyChart status confirmed with patient.   ? ?The Managed Medicaid care management team will reach out to the patient again over the next 30 days.  ? ?Arthur Norfolk RN, BSN ?Community Care Coordinator ?  [facility] ?Mobile: [phone]  ? ?Following is a copy of your plan of care:  ?Care Plan : RN Care Manager Plan of Care  ?Updates made by Leane Call, RN since 10/03/2021 12:00 AM  ?  ? ?Problem: Chronic Disease Management and Care Coordination Needs for DMII, HTN, CAD, Seizures   ?Priority: High  ?  ? ?Long-Range Goal: Development of Plan of Care for Chronic Disease Management and Care Coordination Needs (CAD, HTN, DMII, Seizures)   ?Start Date: 08/08/2021  ?Expected End Date: 12/06/2021  ?Priority: High  ?Note:   ?Current Barriers:  ?Knowledge Deficits related to plan of care for management of CAD, HTN, DMII, and Seizures  ?Care Coordination needs related to Financial constraints related to affordable safe housing and utility payment, Housing barriers, Medication procurement, ADL IADL limitations, Literacy concerns, Cognitive Deficits, Memory Deficits, Inability to perform IADL's independently, and Lacks knowledge of community resource: safe & affordable Section 8 Housing, utility payment assistance and dental care providers covered by [employer]  ?Chronic Disease Management support and education needs related to CAD, HTN, DMII, and Seizures ?Corporate treasurer.  ?Non-adherence to prescribed medication regimen ?Difficulty obtaining medications ?Cognitive Deficits ?No Advanced Directives in place ?Falls - Increased Potential for Falls ? ?RNCM Clinical Goal(s):  ?Patient will verbalize understanding of plan for management of CAD, HTN, DMII, and Seizures as evidenced by improved management of these chronic diseases. ?verbalize basic understanding of CAD, HTN, DMII, and Seizures disease process and self health management plan as evidenced by noted improvement of management of these chronic diseases. ?take all medications exactly as prescribed and will call provider for medication  related questions as evidenced by  being compliant with all medications    ?attend all scheduled medical appointments: 10/05/21 [facility] BSW; 10/17/2021 Echocardiogram; 10/19/21 [facility] Pharmacist  as evidenced by attending all scheduled appointments        ?demonstrate improved adherence to prescribed treatment plan for CAD, HTN, DMII, and Seizures as evidenced by overall improved management of these chronic diseases ?continue to work with Medical illustrator and/or Social Worker to address care management and care coordination needs related to CAD, HTN, DMII, and Seizures as evidenced by adherence to CM Team Scheduled appointments     ?work with pharmacist to address Financial constraints related to obtaining medications and Medication procurement related to CAD, HTN, DMII, and Seizures as evidenced by review of EMR and patient or pharmacist report    ?work with Child psychotherapist to address Financial constraints related to safe affordable Housing and utility payment , Housing barriers, ADL IADL limitations, Cognitive Deficits, Memory Deficits, Inability to perform IADL's independently, and Lacks knowledge of community resource: safe and affordable Section 8 Housing, utility payment assistance and dental care providers covered by [employer] insurance related to the management of CAD, HTN, DMII, and Seizures as evidenced by review of EMR and patient or Child psychotherapist report     through collaboration with Medical illustrator, provider, and care team.  ? ?Interventions: ?Inter-disciplinary care team collaboration (see longitudinal plan of care) ?Evaluation of current treatment plan related to  self management and patient's adherence to plan as established by provider ?BSW completed telephone outreach with patient and his wife. They stated they are currently in a hotel but are waiting on paperwork and inspections to be completed on the new house. They will be in the hotel until 08/24/21, but do not know when the new house will be ready to move in. Patient does not have any  income right. They are needing assistance with paying the last utility bill from their previous home. BSW informed they can go to DSS and apply for the utility program, however since the services are disconnected due to moving, they may not assist. They provided an email address for the dental resources to be emailed to [email or web address]. No other resources are needed at this time.  ?09/05/21: BSW completed follow up with patient's wife while patient was at an appointment. She stated they are still in the hotel and patient still does not have any income. They have applied for disability and are waiting for an appeal letter. [name] states they have been in contact with the housing caseworker and she did state that it can take some time.  ?10/03/2021:  [name] informed this RN Care Manager that patient's disability has been denied.  Patient and wife recently moved from a hotel to a new home in [place].  New Address updated in patient's medical record. ? ? ?CAD  (Status: Goal on Track (progressing): YES.) Long Term Goal  ?Assessed understanding of CAD diagnosis ?Medications reviewed including medications utilized in CAD treatment plan ?Provided education on importance of blood pressure control in management of CAD; ?Counseled on the importance of exercise goals with target of 150 minutes per week ?Reviewed Importance of taking all medications as prescribed ?Reviewed Importance of attending all scheduled provider appointments ?Assessed social determinant of health barriers;  ?[name] denies patient having any chest pain, edema or shortness of breath. Wife also reports patient weight is stable at 167 lbs.  Discussed with patient's wife the importance of increased exercise and activity for  patient.  [name] states patient is not as active as he previously was but wife encourages him to do things for himself so he will be self efficient with ADLs and IADLs.  Discussed need for a cane to help  with ambulation in order to participate in ADLs and IADLs safely.  Wife thinks a cane would be very helpful for patient's ambulation. ?Patient currently has all medications per patient's wife. ? ?Diabetes:

## 2021-10-03 NOTE — Patient Outreach (Cosign Needed)
?Medicaid Managed Care   ?Nurse Care Manager Note ? ?10/03/2021 ?Name:  Arthur [name] MRN:  [id number] DOB:  05-16-1961 ? ?SAITH OWSIANY is an 61 y.o. year old male who is a primary patient of [name], MD.  The [facility] Managed Care Coordination team was consulted for assistance with:    ?CAD ?HTN ?DMII ?Seizures ? ?Arthur [name]'s wife, Arthur [name], was given information about Medicaid Managed Care Coordination team services today. Arthur [name] Primary Caregiver agreed to services and verbal consent obtained. ? ?Engaged with patient by telephone for follow up visit in response to provider referral for case management and/or care coordination services.  ? ?Assessments/Interventions:  Review of past medical history, allergies, medications, health status, including review of consultants reports, laboratory and other test data, was performed as part of comprehensive evaluation and provision of chronic care management services. ? ?SDOH (Social Determinants of Health) assessments and interventions performed: ? ? ?Care Plan ? ?Allergies  ?Allergen Reactions  ? Penicillins Hives  ?  Did it involve swelling of the face/tongue/throat, SOB, or low BP? Y ?Did it involve sudden or severe rash/hives, skin peeling, or any reaction on the inside of your mouth or nose? N ?Did you need to seek medical attention at a hospital or doctor's office? Y ?When did it last happen?  Over 5 Years Ago     ?If all above answers are ?NO?, may proceed with cephalosporin use. ?  ? ? ?Medications Reviewed Today   ? ? Reviewed by Leane Call, RN (Case Manager) on 10/03/21 at 1413  Med List Status: <None>  ? ?Medication Order Taking? Sig Documenting Provider Last Dose Status Informant  ?amLODipine (NORVASC) 10 MG tablet [id number] No Take 1 tablet (10 mg total) by mouth daily. [name], [name], PA-C Taking Active Spouse/Significant Other  ?aspirin EC 81 MG tablet [id number] No Take 1 tablet (81 mg total) by mouth daily. Swallow whole.  [name], [name], PA-C Taking Active   ?atorvastatin (LIPITOR) 40 MG tablet [id number] No Take 1 tablet (40 mg total) by mouth daily. [name], [name], PA-C Taking Active Spouse/Significant Other  ?cholecalciferol (VITAMIN D) 25 MCG (1000 UNIT) tablet [id number] No Take by mouth.  Taking Active   ?hydrALAZINE (APRESOLINE) 50 MG tablet [id number] No Take 1 tablet (50 mg total) by mouth every 8 (eight) hours. [name], [name], PA-C Taking Active Spouse/Significant Other  ?isosorbide dinitrate (ISORDIL) 30 MG tablet [id number] No Take 1 tablet (30 mg total) by mouth 3 (three) times daily. [name], [name], PA-C Taking Active Spouse/Significant Other  ?lacosamide (VIMPAT) 200 MG TABS tablet [id number] No Take 1 tablet (200 mg total) by mouth 2 (two) times daily. Ocie Doyne, MD Taking Active   ?levETIRAcetam (KEPPRA) 750 MG tablet [id number] No Take 2 tablets (1,500 mg total) by mouth 2 (two) times daily. Eliezer Bottom, MD Taking Active   ?metFORMIN (GLUCOPHAGE) 500 MG tablet [id number] No Take 2 tablets (1,000 mg total) by mouth 2 (two) times daily with a meal. Aslam, Sadia, MD Taking Active   ?metoprolol tartrate (LOPRESSOR) 50 MG tablet [id number] No Take 1 tablet (50 mg total) by mouth 2 (two) times daily. [name], [name], PA-C Taking Active Spouse/Significant Other  ?sacubitril-valsartan (ENTRESTO) 49-51 MG [id number] No Take 1 tablet by mouth 2 (two) times daily. Start on 06/29/2021 Elder [name], MD Taking Active   ?         ?Med Note [name]   Tue Aug 22, 2021  3:45 PM)    ?  Vitamin D, Cholecalciferol, 25 MCG (1000 UT) CAPS [id number] No Take 1 tablet by mouth daily. Eliezer Bottom, MD Taking Active   ?Med List Note [name], CPhT 02/24/21 1756): [place]: "12/08/20 receiving PAP through [employer], approved until 12/08/2022"  ? ?  ?  ? ?  ? ? ?Patient Active Problem List  ? Diagnosis Date Noted  ? Vitamin D deficiency 09/07/2021  ? Cognitive impairment 09/06/2021  ? Coronary artery  disease   ? HFrEF (heart failure with reduced ejection fraction) ([facility]) 05/10/2021  ? Encephalopathy acute 03/11/2021  ? Seizures ([facility]) 02/24/2021  ? Aortic atherosclerosis ([facility]) 11/22/2020  ? History of seizures 11/22/2020  ? Alcohol use disorder, severe, dependence ([facility]) 11/22/2020  ? Type 2 diabetes mellitus ([facility]) 11/22/2020  ? Anemia 11/22/2020  ? Hyperlipidemia 11/22/2020  ? Acute encephalopathy 10/16/2020  ? Essential hypertension 10/08/2019  ? History of CVA (cerebrovascular accident) 10/02/2019  ? ? ?Conditions to be addressed/monitored per PCP order:  CAD, HTN, DMII, and Seizures ? ?Care Plan : RN Care Manager Plan of Care  ?Updates made by Leane Call, RN since 10/03/2021 12:00 AM  ?  ? ?Problem: Chronic Disease Management and Care Coordination Needs for DMII, HTN, CAD, Seizures   ?Priority: High  ?  ? ?Long-Range Goal: Development of Plan of Care for Chronic Disease Management and Care Coordination Needs (CAD, HTN, DMII, Seizures)   ?Start Date: 08/08/2021  ?Expected End Date: 12/06/2021  ?Priority: High  ?Note:   ?Current Barriers:  ?Knowledge Deficits related to plan of care for management of CAD, HTN, DMII, and Seizures  ?Care Coordination needs related to Financial constraints related to affordable safe housing and utility payment, Housing barriers, Medication procurement, ADL IADL limitations, Literacy concerns, Cognitive Deficits, Memory Deficits, Inability to perform IADL's independently, and Lacks knowledge of community resource: safe & affordable Section 8 Housing, utility payment assistance and dental care providers covered by [employer]  ?Chronic Disease Management support and education needs related to CAD, HTN, DMII, and Seizures ?Corporate treasurer.  ?Non-adherence to prescribed medication regimen ?Difficulty obtaining medications ?Cognitive Deficits ?No Advanced Directives in place ?Falls - Increased Potential for Falls ? ?RNCM Clinical Goal(s):  ?Patient will  verbalize understanding of plan for management of CAD, HTN, DMII, and Seizures as evidenced by improved management of these chronic diseases. ?verbalize basic understanding of CAD, HTN, DMII, and Seizures disease process and self health management plan as evidenced by noted improvement of management of these chronic diseases. ?take all medications exactly as prescribed and will call provider for medication related questions as evidenced by being compliant with all medications    ?attend all scheduled medical appointments: 10/05/21 [facility] BSW; 10/17/2021 Echocardiogram; 10/19/21 [facility] Pharmacist  as evidenced by attending all scheduled appointments        ?demonstrate improved adherence to prescribed treatment plan for CAD, HTN, DMII, and Seizures as evidenced by overall improved management of these chronic diseases ?continue to work with Medical illustrator and/or Social Worker to address care management and care coordination needs related to CAD, HTN, DMII, and Seizures as evidenced by adherence to CM Team Scheduled appointments     ?work with pharmacist to address Financial constraints related to obtaining medications and Medication procurement related to CAD, HTN, DMII, and Seizures as evidenced by review of EMR and patient or pharmacist report    ?work with Child psychotherapist to address Financial constraints related to safe affordable Housing and utility payment , Housing barriers, ADL IADL limitations, Cognitive Deficits, Memory  Deficits, Inability to perform IADL's independently, and Lacks knowledge of community resource: safe and affordable Section 8 Housing, utility payment assistance and dental care providers covered by Managed Medicaid insurance related to the management of CAD, HTN, DMII, and Seizures as evidenced by review of EMR and patient or Child psychotherapist report     through collaboration with Medical illustrator, provider, and care team.  ? ?Interventions: ?Inter-disciplinary care team collaboration (see longitudinal plan  of care) ?Evaluation of current treatment plan related to  self management and patient's adherence to plan as established by provider ?BSW completed telephone outreach with patient and his wife. They stated they are

## 2021-10-04 DIAGNOSIS — R4189 Other symptoms and signs involving cognitive functions and awareness: Secondary | ICD-10-CM | POA: Diagnosis not present

## 2021-10-05 ENCOUNTER — Other Ambulatory Visit: Payer: Self-pay

## 2021-10-05 DIAGNOSIS — R4189 Other symptoms and signs involving cognitive functions and awareness: Secondary | ICD-10-CM | POA: Diagnosis not present

## 2021-10-05 NOTE — Patient Outreach (Signed)
?Medicaid Managed Care ?Social Work Note ? ?10/05/2021 ?Name:  Arthur [name] MRN:  [id number] DOB:  1960/09/27 ? ?Arthur [name] is an 61 y.o. year old male who is a primary [name] of Arthur Manes, MD.  The Medicaid Managed Care Coordination team was consulted for assistance with:  Community Resources  ? ?Arthur [name] was given information about Medicaid Managed Care Coordination team services today. Arthur [name] agreed to services and verbal consent obtained. ? ?Engaged with [name]  for by telephone forfollow up visit in response to referral for case management and/or care coordination services.  ? ?Assessments/Interventions:  Review of past medical history, allergies, medications, health status, including review of consultants reports, laboratory and other test data, was performed as part of comprehensive evaluation and provision of chronic care management services. ? ?SDOH: (Social Determinant of Health) assessments and interventions performed: ? Arthur [name] completed telephone outreach with [name] and wife. She stated they moved into their new home on 09/26/21 and they are still unpacking. She states [name] is doing really good. She did ask for some resources that will help with household items, Arthur [name] will put a list together and send to [name]. ? ?Advanced Directives Status:  Not addressed in this encounter. ? ?Care Plan ?                ?Allergies  ?Allergen Reactions  ? Penicillins Hives  ?  Did it involve swelling of the face/tongue/throat, SOB, or low BP? Y ?Did it involve sudden or severe rash/hives, skin peeling, or any reaction on the inside of your mouth or nose? N ?Did you need to seek medical attention at a hospital or doctor's office? Y ?When did it last happen?  Over 5 Years Ago     ?If all above answers are ?NO?, may proceed with cephalosporin use. ?  ? ? ?Medications Reviewed Today   ? ? Reviewed by Arthur [name] (Case Manager) on 10/03/21 at 1413  Med List Status: <None>  ? ?Medication Order  Taking? Sig Documenting Provider Last Dose Status Informant  ?amLODipine (NORVASC) 10 MG tablet [id number] No Take 1 tablet (10 mg total) by mouth daily. Arthur [name], Arthur [name], Arthur [name] Taking Active Spouse/Significant Other  ?aspirin EC 81 MG tablet [id number] No Take 1 tablet (81 mg total) by mouth daily. Swallow whole. Arthur [name], Arthur [name], Arthur [name] Taking Active   ?atorvastatin (LIPITOR) 40 MG tablet [id number] No Take 1 tablet (40 mg total) by mouth daily. Arthur [name], Arthur Legacy, Arthur [name] Taking Active Spouse/Significant Other  ?cholecalciferol (VITAMIN D) 25 MCG (1000 UNIT) tablet [id number] No Take by mouth.  Taking Active   ?hydrALAZINE (APRESOLINE) 50 MG tablet [id number] No Take 1 tablet (50 mg total) by mouth every 8 (eight) hours. Arthur [name], Arthur [name], Arthur [name] Taking Active Spouse/Significant Other  ?isosorbide dinitrate (ISORDIL) 30 MG tablet [id number] No Take 1 tablet (30 mg total) by mouth 3 (three) times daily. Arthur [name], Arthur [name], Arthur [name] Taking Active Spouse/Significant Other  ?lacosamide (VIMPAT) 200 MG TABS tablet [id number] No Take 1 tablet (200 mg total) by mouth 2 (two) times daily. Arthur Harold, MD Taking Active   ?levETIRAcetam (KEPPRA) 750 MG tablet [id number] No Take 2 tablets (1,500 mg total) by mouth 2 (two) times daily. Arthur Heck, MD Taking Active   ?metFORMIN (GLUCOPHAGE) 500 MG tablet [id number] No Take 2 tablets (1,000 mg total) by mouth 2 (two) times daily with a meal. [name], Sadia, MD Taking Active   ?metoprolol tartrate (LOPRESSOR) 50 MG tablet [id number] No Take 1 tablet (  50 mg total) by mouth 2 (two) times daily. Arthur [name], Arthur [name], Arthur [name] Taking Active Spouse/Significant Other  ?sacubitril-valsartan (ENTRESTO) 49-51 MG [id number] No Take 1 tablet by mouth 2 (two) times daily. Start on 06/29/2021 Arthur Mormon, MD Taking Active   ?         ?Med Note De [place]   Tue Aug 22, 2021  3:45 PM)    ?Vitamin D, Cholecalciferol, 25 MCG (1000 UT) CAPS [id number] No Take 1 tablet by mouth daily. Arthur Heck, MD Taking Active   ?Med List Note Arthur [name], Arthur [name] 02/24/21 1756): [place]: "12/08/20 receiving PAP through [employer], approved until 12/08/2022"  ? ?  ?  ? ?  ? ? ?[name] Active Problem List  ? Diagnosis Date Noted  ? Vitamin D deficiency 09/07/2021  ? Cognitive impairment 09/06/2021  ? Coronary artery disease   ? HFrEF (heart failure with reduced ejection fraction) ([place]) 05/10/2021  ? Encephalopathy acute 03/11/2021  ? Seizures ([place]) 02/24/2021  ? Aortic atherosclerosis ([place]) 11/22/2020  ? History of seizures 11/22/2020  ? Alcohol use disorder, severe, dependence ([place]) 11/22/2020  ? Type 2 diabetes mellitus ([place]) 11/22/2020  ? Anemia 11/22/2020  ? Hyperlipidemia 11/22/2020  ? Acute encephalopathy 10/16/2020  ? Essential hypertension 10/08/2019  ? History of CVA (cerebrovascular accident) 10/02/2019  ? ? ?Conditions to be addressed/monitored per PCP order:   housing ? ?Care Plan : [name] Care Manager Plan of Care  ?Updates made by Arthur [name] since 10/05/2021 12:00 AM  ?  ? ?Problem: Chronic Disease Management and Care Coordination Needs for DMII, HTN, CAD, Seizures   ?Priority: High  ?  ? ?Long-Range Goal: Development of Plan of Care for Chronic Disease Management and Care Coordination Needs (CAD, HTN, DMII, Seizures)   ?Start Date: 08/08/2021  ?Expected End Date: 12/06/2021  ?Priority: High  ?Note:   ?Current Barriers:  ?Knowledge Deficits related to plan of care for management of CAD, HTN, DMII, and Seizures  ?Care Coordination needs related to Financial constraints related to affordable safe housing and utility payment, Housing barriers, Medication procurement, ADL IADL limitations, Literacy concerns, Cognitive Deficits, Memory Deficits, Inability to perform IADL's independently, and Lacks knowledge of community resource: safe & affordable Section 8 Housing, utility payment assistance and dental care providers covered by [name].  ?Chronic Disease Management support and education  needs related to CAD, HTN, DMII, and Seizures ?Film/video editor.  ?Non-adherence to prescribed medication regimen ?Difficulty obtaining medications ?Cognitive Deficits ?No Advanced Directives in place ?Falls - Increased Potential for Falls ? ?RNCM Clinical Goal(s):  ?[name] will verbalize understanding of plan for management of CAD, HTN, DMII, and Seizures as evidenced by improved management of these chronic diseases. ?verbalize basic understanding of CAD, HTN, DMII, and Seizures disease process and self health management plan as evidenced by noted improvement of management of these chronic diseases. ?take all medications exactly as prescribed and will call provider for medication related questions as evidenced by being compliant with all medications    ?attend all scheduled medical appointments: 10/05/21 [facility] Arthur [name]; 10/17/2021 Echocardiogram; 10/19/21 [facility] Pharmacist  as evidenced by attending all scheduled appointments        ?demonstrate improved adherence to prescribed treatment plan for CAD, HTN, DMII, and Seizures as evidenced by overall improved management of these chronic diseases ?continue to work with Consulting civil engineer and/or Social Worker to address care management and care coordination needs related to CAD, HTN, DMII, and Seizures as evidenced by adherence to CM Team  Scheduled appointments     ?work with pharmacist to address Financial constraints related to obtaining medications and Medication procurement related to CAD, HTN, DMII, and Seizures as evidenced by review of EMR and [name] or pharmacist report    ?work with Education officer, museum to address Financial constraints related to safe affordable Housing and utility payment , Housing barriers, ADL IADL limitations, Cognitive Deficits, Memory Deficits, Inability to perform IADL's independently, and Lacks knowledge of community resource: safe and affordable Section 8 Housing, utility payment assistance and dental care providers covered by [employer]  insurance related to the management of CAD, HTN, DMII, and Seizures as evidenced by review of EMR and [name] or Education officer, museum report     through collaboration with Consulting civil engineer, provider, and care team.  ? ?In

## 2021-10-05 NOTE — Patient Instructions (Signed)
Visit Information ? ?Arthur [name] was given information about Medicaid Managed Care team care coordination services as a part of their [place] Medicaid benefit. Arthur [name] verbally consented to engagement with the [facility] Managed Care team.  ? ?If you are experiencing a medical emergency, please call 911 or report to your local emergency department or urgent care.  ? ?If you have a non-emergency medical problem during routine business hours, please contact your provider's office and ask to speak with a nurse.  ? ?For questions related to your [facility], please call: [phone] or visit the homepage here: [email or web address] ? ?If you would like to schedule transportation through your [facility], please call the following number at least 2 days in advance of your appointment: [phone]. ? Rides for urgent appointments can also be made after hours by calling Member Services. ? ?Call the [place] at [phone], at any time, 24 hours a day, 7 days a week. If you are in danger or need immediate medical attention call 911. ? ?If you would like help to quit smoking, call 1-800-QUIT-NOW 581 530 3516) OR Espa?ol: 1-855-D?jelo-Ya 7861329673) o para m?s informaci?n haga clic aqu? or Text READY to 200-400 to register via text ? ?Arthur [name] - following are the goals we discussed in your visit today:  ? Goals Addressed   ?None ?  ? ? ? ?Social Worker will follow up in 30-45.  ? ?Arthur [name], BSW, [place] ?[place]  ?High Risk Managed Medicaid Team  ?[phone]  ? ?Following is a copy of your plan of care:  ?Care Plan : [name]  ?Updates made by Arthur [name] since 10/05/2021 12:00 AM  ?  ? ?Problem: Chronic Disease Management and Care Coordination Needs for DMII, HTN, CAD, Seizures   ?Priority: High  ?   ? ?Long-Range Goal: Development of Plan of Care for Chronic Disease Management and Care Coordination Needs (CAD, HTN, DMII, Seizures)   ?Start Date: 08/08/2021  ?Expected End Date: 12/06/2021  ?Priority: High  ?Note:   ?Current Barriers:  ?Knowledge Deficits related to plan of care for management of CAD, HTN, DMII, and Seizures  ?Care Coordination needs related to Financial constraints related to affordable safe housing and utility payment, Housing barriers, Medication procurement, ADL IADL limitations, Literacy concerns, Cognitive Deficits, Memory Deficits, Inability to perform IADL's independently, and Lacks knowledge of community resource: safe & affordable Section 8 Housing, utility payment assistance and dental care providers covered by [name].  ?Chronic Disease Management support and education needs related to CAD, HTN, DMII, and Seizures ?Film/video editor.  ?Non-adherence to prescribed medication regimen ?Difficulty obtaining medications ?Cognitive Deficits ?No Advanced Directives in place ?Falls - Increased Potential for Falls ? ?RNCM Clinical Goal(s):  ?Patient will verbalize understanding of plan for management of CAD, HTN, DMII, and Seizures as evidenced by improved management of these chronic diseases. ?verbalize basic understanding of CAD, HTN, DMII, and Seizures disease process and self health management plan as evidenced by noted improvement of management of these chronic diseases. ?take all medications exactly as prescribed and will call provider for medication related questions as evidenced by being compliant with all medications    ?attend all scheduled medical appointments: 10/05/21 [facility] BSW; 10/17/2021 Echocardiogram; 10/19/21 [facility] Pharmacist  as evidenced by attending all scheduled appointments        ?demonstrate improved adherence to prescribed treatment plan for CAD, HTN, DMII,  and Seizures as evidenced by overall improved management of these chronic diseases ?continue to  work with Consulting civil engineer and/or Social Worker to address care management and care coordination needs related to CAD, HTN, DMII, and Seizures as evidenced by adherence to CM Team Scheduled appointments     ?work with pharmacist to address Financial constraints related to obtaining medications and Medication procurement related to CAD, HTN, DMII, and Seizures as evidenced by review of EMR and patient or pharmacist report    ?work with Education officer, museum to address Financial constraints related to safe affordable Housing and utility payment , Housing barriers, ADL IADL limitations, Cognitive Deficits, Memory Deficits, Inability to perform IADL's independently, and Lacks knowledge of community resource: safe and affordable Section 8 Housing, utility payment assistance and dental care providers covered by [employer] insurance related to the management of CAD, HTN, DMII, and Seizures as evidenced by review of EMR and patient or Education officer, museum report     through collaboration with Consulting civil engineer, provider, and care team.  ? ?Interventions: ?Inter-disciplinary care team collaboration (see longitudinal plan of care) ?Evaluation of current treatment plan related to  self management and patient's adherence to plan as established by provider ?BSW completed telephone outreach with patient and his wife. They stated they are currently in a hotel but are waiting on paperwork and inspections to be completed on the new house. They will be in the hotel until 08/24/21, but do not know when the new house will be ready to move in. Patient does not have any income right. They are needing assistance with paying the last utility bill from their previous home. BSW informed they can go to DSS and apply for the utility program, however since the services are disconnected due to moving, they may not assist. They provided an email address for the dental resources to be emailed to [email or web address]. No other resources are needed at this time.   ?09/05/21: BSW completed follow up with patient's wife while patient was at an appointment. She stated they are still in the hotel and patient still does not have any income. They have applied for disability and are waiting for an appeal letter. [name] states they have been in contact with the housing caseworker and she did state that it can take some time.  ?10/05/21: BSW completed telephone outreach with patient and wife. She stated they moved into their new home on 09/26/21 and they are still unpacking. She states patient is doing really good. She did ask for some resources that will help with household items, BSW will put a list together and send to patient. ?10/03/2021:  [name] informed this RN Care Manager that patient's disability has been denied.  Patient and wife recently moved from a hotel to a new home in [place].  New Address updated in patient's medical record. ? ? ?CAD  (Status: Goal on Track (progressing): YES.) Long Term Goal  ?Assessed understanding of CAD diagnosis ?Medications reviewed including medications utilized in CAD treatment plan ?Provided education on importance of blood pressure control in management of CAD; ?Counseled on the importance of exercise goals with target of 150 minutes per week ?Reviewed Importance of taking all medications as prescribed ?Reviewed Importance of attending all scheduled provider appointments ?Assessed social determinant of health barriers;  ?[name] denies patient having any chest pain, edema or shortness of breath. Wife also reports patient weight is stable at 167 lbs.  Discussed with patient's wife the importance of increased exercise and activity  for patient.  [name] states patient is not as active as he previously was but wife encourages him to do things for himself so he will be self efficient with ADLs and IADLs.  Discussed need for a cane to help with ambulation in order to participate in ADLs and IADLs safely.  Wife thinks a  cane would be very helpful for patient's ambulation. ?Patient currently has all medications per patient's wife. ? ?Diabetes:  (Status: Goal on Track (progressing): YES.) Long Term Goal  ?Lab Results  ?Compone

## 2021-10-06 ENCOUNTER — Ambulatory Visit: Payer: Medicaid Other

## 2021-10-06 DIAGNOSIS — R4701 Aphasia: Secondary | ICD-10-CM | POA: Diagnosis not present

## 2021-10-06 DIAGNOSIS — R41841 Cognitive communication deficit: Secondary | ICD-10-CM

## 2021-10-06 NOTE — Therapy (Signed)
?OUTPATIENT SPEECH LANGUAGE PATHOLOGY TREATMENT NOTE ? ? ?Patient Name: Arthur [name] ?MRN: [id number] ?DOB:07/14/1960, 61 y.o., male ?Today's Date: 10/06/2021 ? ?PCP: [name], MD ?REFERRING PROVIDER: Angelica Pou, MD  ? ?END OF SESSION:  ? End of Session - 10/06/21 0905   ? ? Visit Number 3   ? Number of Visits 17   ? Date for SLP Re-Evaluation 12/07/21   ? Authorization Type [employer] Medicaid (no auth; 27 visits max)   ? SLP Start Time [phone]   ? SLP Stop Time  [phone]   ? SLP Time Calculation (min) 45 min   ? Activity Tolerance Patient tolerated treatment well   ? ?  ?  ? ?  ? ? ?Past Medical History:  ?Diagnosis Date  ? DM2 (diabetes mellitus, type 2) ([place])   ? ETOH abuse   ? Hypertension   ? Seizures ([place])   ? Transaminitis   ? ?Past Surgical History:  ?Procedure Laterality Date  ? HEMORRHOID SURGERY    ? INTRAVASCULAR PRESSURE WIRE/FFR STUDY N/A 06/27/2021  ? Procedure: INTRAVASCULAR PRESSURE WIRE/FFR STUDY;  Surgeon: Nigel Mormon, MD;  Location: [facility];  Service: Cardiovascular;  Laterality: N/A;  ? LEFT HEART CATH AND CORONARY ANGIOGRAPHY N/A 06/27/2021  ? Procedure: LEFT HEART CATH AND CORONARY ANGIOGRAPHY;  Surgeon: Nigel Mormon, MD;  Location: [facility];  Service: Cardiovascular;  Laterality: N/A;  ? ?Patient Active Problem List  ? Diagnosis Date Noted  ? Vitamin D deficiency 09/07/2021  ? Cognitive impairment 09/06/2021  ? Coronary artery disease   ? HFrEF (heart failure with reduced ejection fraction) ([place]) 05/10/2021  ? Encephalopathy acute 03/11/2021  ? Seizures ([place]) 02/24/2021  ? Aortic atherosclerosis ([place]) 11/22/2020  ? History of seizures 11/22/2020  ? Alcohol use disorder, severe, dependence ([place]) 11/22/2020  ? Type 2 diabetes mellitus ([place]) 11/22/2020  ? Anemia 11/22/2020  ? Hyperlipidemia 11/22/2020  ? Acute encephalopathy 10/16/2020  ? Essential hypertension 10/08/2019  ? History of CVA (cerebrovascular accident) 10/02/2019  ? ? ?ONSET DATE: February 24, 2021  ?  ?REFERRING DIAG: R56.9 (ICD-10-CM) - Seizures ([place]) Z86.73 (ICD-10-CM) - History of CVA (cerebrovascular accident) ? ?THERAPY DIAG:  ?Aphasia ? ?Cognitive communication deficit ? ?SUBJECTIVE: "I can't talk pretty good"  ? ?PAIN:  ?Are you having pain? No ?OBJECTIVE:  ?TODAY'S TREATMENT:  ?10-06-21: Significant difficulty discussing recent events, family members, and favorite television shows noted. Usual perseverations and semantic and phonemic paraphasias noted. SLP trialed writing and cued description to aid dysnomia, which was mildly effective. SLP targeted functional divergent naming of personally relevant items to optimize functional communication. Intermittent mod to max A required to aid articulatory precision due to inconsistent production. Pt benefited from SLP modeling and cued repetition at word level. Targeted simple sentence generation with targeted words re: fishing hobby, in which errors increased as task progressed due to perseveration. HEP provided. Communication Participation Item Bank (CPIB) completed today with score of 8. Intermittent repetition and rephrasing required to aid patient comprehension. CLQT deferred due to severity of speech and language deficits.   ? ?09-29-2021: Pt arrives late to session, reports d/t transportation. Advised pt of need to attend therapy appointments regularly to make progress towards therapy goals. Target orientation through use of calendar with days marked off. Requires max-A to relay date despite visual cues. Errorless learning and spaced retrieval up to 3 minute delay to accurately ID today's date. Provided written instructions on implementing orientation routine for home. Target verbal expression  in simple naming task. Able to ID x4 verbs then list x3 related items for each with usual max-A. Usual auditory comprehension impairment observed. Usual apraxia errors during verbal expression. Benefits from segmentation and direct model.  ?  ?  ?PATIENT  EDUCATION: ?Education details: see above ?Person educated: Patient  ?Education method: Explanation, Demonstration, and Handouts ?Education comprehension: verbalized understanding, returned demonstration, and needs further education ?  ?  ?  ?  ?GOALS: ?Goals reviewed with patient? Yes ?  ?SHORT TERM GOALS: Target date: 10/13/2021 ?  ?Pt will complete standardized assessment for cognition, language and PROM within first two therapy sessions ?Baseline: initiated CLQT (deferred CLQT due to aphasia; CPIB=8) ?Goal status: Partially Met ?  ?2.  Pt will complete structured language tasks (e.g. VNeST, SFA, RET) with 80% accuracy, with mod-A over 2 sessions ?Baseline: language score on CLQT (15) indicates significant impairment  ?Goal status: ongoing ?  ?3.  Pt's spouse/caregiver/care partner will provide appropriate cues to assist pt with verbal output given occasional min A over 2 sessions ?Baseline: caregiver has not received training on types of cues or cueing hierarchy  ?Goal status: ongoing ?  ?4.  Pt will verbalize and implement 2 attention and memory compensations to aid daily functioning given occasional min A over 2 sessions ?Baseline: none being used ?Goal status: ongoing ?  ?  ?LONG TERM GOALS: Target date: 12/08/2021 ?  ?Pt will report subjective improvement in communication abilities via PROM by d/c  ?Baseline: PROM to be administered first therapy session; CPIB: 8 ?Goal status: ongoing ?  ?2.  Pt and spouse will report compliance with HEP targeting expressive language with mod-I over 1 week period  ?Baseline: HEP to be established ?Goal status: ongoing ?  ?3.  Pt and spouse will report improved participation in x2 preferred activities as home with use of trained compensations over 1 week period ?Baseline: report pt is not participating in preferred activities or household chores, limited to sitting on porch and watching TV ?Goal status: ongoing ?  ?4.  Pt will carryover 2 stratregies for memory to recall  conversations and pertinent events with family over 1 week ?Baseline:  ?Goal status: ongoing ?  ?  ?ASSESSMENT: ?  ?CLINICAL IMPRESSION: ?Patient is a 61 y.o. M who was seen today for cognitive communication deficits and aphasia s/p CVA + seizure. Initiated education and instruction for speech and language strategies/tasks to optimize functional communication, which is notably impaired. Writing was affected at word level, even copying. Mod to max visual cues and syllable segmentation were effective to aid articulatory precision at word level. I recommend skilled ST to address expressive language and cognitive deficits.  ?  ?OBJECTIVE IMPAIRMENTS include attention, memory, awareness, executive functioning, expressive language, and aphasia. These impairments are limiting patient from return to work, managing medications, managing appointments, managing finances, household responsibilities, ADLs/IADLs, and effectively communicating at home and in community. ?Factors affecting potential to achieve goals and functional outcome are ability to learn/carryover information and severity of impairments.. Patient will benefit from skilled SLP services to address above impairments and improve overall function. ?  ?REHAB POTENTIAL: Fair dependent on participation in HEP, frequency at which pt can attend therapy ?  ?PLAN: ?SLP FREQUENCY: 2x/week ?  ?SLP DURATION: 12 weeks ?  ?PLANNED INTERVENTIONS: Language facilitation, Environmental controls, Cueing hierachy, Cognitive reorganization, Internal/external aids, Functional tasks, Multimodal communication approach, SLP instruction and feedback, Compensatory strategies, and Patient/family education ? ?Marzetta Board, CCC-SLP ?10/06/2021, 10:13 AM ?  ?

## 2021-10-06 NOTE — Patient Instructions (Addendum)
Your wife can help you write and say these important words. Look at her mouth to help you get the speech sounds correct. You have have to slow down and say each syllable. Practice them every day! ? ?Family Members ? ? ? ? ? ? ? ?Friends ? ? ? ? ? ? ?Television Shows ? ? ? ? ? ? ?Words related to fishing ?Fishing pole  ?Worms  ?Tackle box  ?Bobber  ?Hook  ?Pond  ?[place] Foil  ?Sunnies  ?Catfish ?

## 2021-10-10 ENCOUNTER — Ambulatory Visit: Payer: Medicaid Other

## 2021-10-11 DIAGNOSIS — R4189 Other symptoms and signs involving cognitive functions and awareness: Secondary | ICD-10-CM | POA: Diagnosis not present

## 2021-10-12 ENCOUNTER — Ambulatory Visit: Payer: Medicaid Other | Admitting: Speech Pathology

## 2021-10-12 DIAGNOSIS — R4701 Aphasia: Secondary | ICD-10-CM | POA: Diagnosis not present

## 2021-10-12 DIAGNOSIS — R4189 Other symptoms and signs involving cognitive functions and awareness: Secondary | ICD-10-CM | POA: Diagnosis not present

## 2021-10-12 DIAGNOSIS — R41841 Cognitive communication deficit: Secondary | ICD-10-CM

## 2021-10-12 NOTE — Patient Instructions (Signed)
Tips for Talking with People who have Aphasia  Say one thing at a time Don't  rush - slow down, be patient Talk face to face Reduce background noise Relax - be natural Use pen and paper Write down key words Draw diagrams or pictures Don't pretend you understand Ask what helps Recap - check you both understand Be a partner, not a therapist   Aphasia does not affect intelligence, only language. The person with aphasia can still: make decisions, have opinions, and socialize.   Today we practiced Describing words when we get stuck  What group does it belong to?  What do I use it for?  Where can I find it?  What does it LOOK like?  What other words go with it?  What is the 1st sound of the word?   Many Ways to Communicate  Describe it Write it Draw it Gesture it Use related words  [place] with Dr. Leo Rod at [facility] - email [email or web address]  TalkPath Therapy app by Vallery Ridge on [facility] website National Aphasia Association - naa.aphasia.org Aphasia Recovery Connection - aphasiarecoveryconnection.org Tactus therapy apps Constant Therapy  Watch: Patience Listening and Communicating with Aphasia Patients on YouTube [email or web address]

## 2021-10-12 NOTE — Therapy (Signed)
OUTPATIENT SPEECH LANGUAGE PATHOLOGY TREATMENT NOTE   Patient Name: Arthur [name] MRN: [id number] DOB:Sep 30, 1960, 61 y.o., male Today's Date: 10/12/2021  PCP: [name], MD REFERRING PROVIDER: Angelica Pou, MD   END OF SESSION:   End of Session - 10/12/21 0931     Visit Number 4    Number of Visits 17    Date for SLP Re-Evaluation 12/07/21    Authorization Type UHC Medicaid (no auth; 27 visits max)    SLP Start Time [phone]    SLP Stop Time  1015    SLP Time Calculation (min) 44 min    Activity Tolerance Patient tolerated treatment well             Past Medical History:  Diagnosis Date   DM2 (diabetes mellitus, type 2) ([place])    ETOH abuse    Hypertension    Seizures ([place])    Transaminitis    Past Surgical History:  Procedure Laterality Date   HEMORRHOID SURGERY     INTRAVASCULAR PRESSURE WIRE/FFR STUDY N/A 06/27/2021   Procedure: INTRAVASCULAR PRESSURE WIRE/FFR STUDY;  Surgeon: Nigel Mormon, MD;  Location: [facility];  Service: Cardiovascular;  Laterality: N/A;   LEFT HEART CATH AND CORONARY ANGIOGRAPHY N/A 06/27/2021   Procedure: LEFT HEART CATH AND CORONARY ANGIOGRAPHY;  Surgeon: Nigel Mormon, MD;  Location: [facility];  Service: Cardiovascular;  Laterality: N/A;   Patient Active Problem List   Diagnosis Date Noted   Vitamin D deficiency 09/07/2021   Cognitive impairment 09/06/2021   Coronary artery disease    HFrEF (heart failure with reduced ejection fraction) ([place]) 05/10/2021   Encephalopathy acute 03/11/2021   Seizures ([place]) 02/24/2021   Aortic atherosclerosis ([place]) 11/22/2020   History of seizures 11/22/2020   Alcohol use disorder, severe, dependence ([place]) 11/22/2020   Type 2 diabetes mellitus ([place]) 11/22/2020   Anemia 11/22/2020   Hyperlipidemia 11/22/2020   Acute encephalopathy 10/16/2020   Essential hypertension 10/08/2019   History of CVA (cerebrovascular accident) 10/02/2019    ONSET DATE: February 24, 2021    REFERRING DIAG: R56.9 (ICD-10-CM) - Seizures ([place]) Z86.73 (ICD-10-CM) - History of CVA (cerebrovascular accident)  THERAPY DIAG:  Aphasia  Cognitive communication deficit  SUBJECTIVE: "Do you think I'm getting better?"   PAIN:  Are you having pain? No OBJECTIVE:  TODAY'S TREATMENT:  10-12-21: SLP provides education on supportive communication for people with aphasia. Provide written handout for pt to share with communication partners. During informal conversation, pt with usual impaired comprehension of questions/comments, demonstrated through inappropriate responses. Target convergent naming, pt accurate 6/10 trials with usual repetition and written support. Able to generate appropriate sentence with target word 9/10 trials with rare min-A, though demonstrating some semantic paraphasias. Awareness of, without ability to correct without support from [place]. Syllable segmentation and choral production beneficially for 3+ syllable words. Target anomia compensation of describing. With mod to max-A, in form of questioning cues and phonemic cues, able to describe x3 target words. Update pt HEP.   10-06-21: Significant difficulty discussing recent events, family members, and favorite television shows noted. Usual perseverations and semantic and phonemic paraphasias noted. SLP trialed writing and cued description to aid dysnomia, which was mildly effective. SLP targeted functional divergent naming of personally relevant items to optimize functional communication. Intermittent mod to max A required to aid articulatory precision due to inconsistent production. Pt benefited from SLP modeling and cued repetition at word level. Targeted simple sentence generation with targeted words re: fishing  hobby, in which errors increased as task progressed due to perseveration. HEP provided. Communication Participation Item Bank (CPIB) completed today with score of 8. Intermittent repetition and rephrasing required to  aid patient comprehension. CLQT deferred due to severity of speech and language deficits.    09-29-2021: Pt arrives late to session, reports d/t transportation. Advised pt of need to attend therapy appointments regularly to make progress towards therapy goals. Target orientation through use of calendar with days marked off. Requires max-A to relay date despite visual cues. Errorless learning and spaced retrieval up to 3 minute delay to accurately ID today's date. Provided written instructions on implementing orientation routine for home. Target verbal expression in simple naming task. Able to ID x4 verbs then list x3 related items for each with usual max-A. Usual auditory comprehension impairment observed. Usual apraxia errors during verbal expression. Benefits from segmentation and direct model.      PATIENT EDUCATION: Education details: see above Person educated: Patient  Education method: Explanation, Demonstration, and Handouts Education comprehension: verbalized understanding, returned demonstration, and needs further education         GOALS: Goals reviewed with patient? Yes   SHORT TERM GOALS: Target date: 10/13/2021   Pt will complete standardized assessment for cognition, language and PROM within first two therapy sessions Baseline: initiated CLQT (deferred CLQT due to aphasia; CPIB=8) Goal status: Partially Met   2.  Pt will complete structured language tasks (e.g. VNeST, SFA, RET) with 80% accuracy, with mod-A over 2 sessions Baseline: language score on CLQT (15) indicates significant impairment; 10-12-21 Goal status: NOT MET   3.  Pt's spouse/caregiver/care partner will provide appropriate cues to assist pt with verbal output given occasional min A over 2 sessions Baseline: caregiver has not received training on types of cues or cueing hierarchy; 10-12-21 Goal status: NOT MET   4.  Pt will verbalize and implement 2 attention and memory compensations to aid daily functioning given  occasional min A over 2 sessions Baseline: none being used; 09-29-21, 10-12-21 Goal status: PARTIALLY MET     LONG TERM GOALS: Target date: 12/08/2021   Pt will report subjective improvement in communication abilities via PROM by d/c  Baseline: PROM to be administered first therapy session; CPIB: 8 Goal status: ongoing   2.  Pt and spouse will report compliance with HEP targeting expressive language with mod-I over 1 week period  Baseline: HEP to be established Goal status: ongoing   3.  Pt and spouse will report improved participation in x2 preferred activities as home with use of trained compensations over 1 week period Baseline: report pt is not participating in preferred activities or household chores, limited to sitting on porch and watching TV Goal status: ongoing   4.  Pt will carryover 2 stratregies for memory to recall conversations and pertinent events with family over 1 week Baseline:  Goal status: ongoing     ASSESSMENT:   CLINICAL IMPRESSION: Patient is a 61 y.o. M who was seen today for cognitive communication deficits and aphasia s/p CVA + seizure. Initiated education and instruction for speech and language strategies/tasks to optimize functional communication, which is notably impaired. Writing was affected at word level, even copying. Mod to max visual cues and syllable segmentation were effective to aid articulatory precision at word level. I recommend skilled ST to address expressive language and cognitive deficits.    OBJECTIVE IMPAIRMENTS include attention, memory, awareness, executive functioning, expressive language, and aphasia. These impairments are limiting patient from return to work, managing  medications, managing appointments, managing finances, household responsibilities, ADLs/IADLs, and effectively communicating at home and in community. Factors affecting potential to achieve goals and functional outcome are ability to learn/carryover information and severity  of impairments.. Patient will benefit from skilled SLP services to address above impairments and improve overall function.   REHAB POTENTIAL: Fair dependent on participation in [place], frequency at which pt can attend therapy   PLAN: SLP FREQUENCY: 2x/week   SLP DURATION: 12 weeks   PLANNED INTERVENTIONS: Language facilitation, Environmental controls, Cueing hierachy, Cognitive reorganization, Internal/external aids, Functional tasks, Multimodal communication approach, SLP instruction and feedback, Compensatory strategies, and Patient/family education  Su Monks, CCC-SLP 10/12/2021, 9:32 AM

## 2021-10-13 DIAGNOSIS — R4189 Other symptoms and signs involving cognitive functions and awareness: Secondary | ICD-10-CM | POA: Diagnosis not present

## 2021-10-16 ENCOUNTER — Ambulatory Visit: Payer: Medicaid Other

## 2021-10-16 DIAGNOSIS — R4701 Aphasia: Secondary | ICD-10-CM | POA: Diagnosis not present

## 2021-10-16 DIAGNOSIS — R41841 Cognitive communication deficit: Secondary | ICD-10-CM

## 2021-10-16 DIAGNOSIS — R4189 Other symptoms and signs involving cognitive functions and awareness: Secondary | ICD-10-CM | POA: Diagnosis not present

## 2021-10-16 NOTE — Therapy (Signed)
OUTPATIENT SPEECH LANGUAGE PATHOLOGY TREATMENT NOTE   Patient Name: Arthur [name] MRN: [id number] DOB:1960/11/16, 61 y.o., male Today's Date: 10/16/2021  PCP: [name], MD REFERRING PROVIDER: Angelica Pou, MD   END OF SESSION:   End of Session - 10/16/21 1309     Visit Number 5    Number of Visits 17    Date for SLP Re-Evaluation 12/07/21    Authorization Type UHC Medicaid (no auth; 27 visits max)    SLP Start Time 1315    SLP Stop Time  6270    SLP Time Calculation (min) 43 min    Activity Tolerance Patient tolerated treatment well             Past Medical History:  Diagnosis Date   DM2 (diabetes mellitus, type 2) ([place])    ETOH abuse    Hypertension    Seizures ([place])    Transaminitis    Past Surgical History:  Procedure Laterality Date   HEMORRHOID SURGERY     INTRAVASCULAR PRESSURE WIRE/FFR STUDY N/A 06/27/2021   Procedure: INTRAVASCULAR PRESSURE WIRE/FFR STUDY;  Surgeon: Nigel Mormon, MD;  Location: [facility];  Service: Cardiovascular;  Laterality: N/A;   LEFT HEART CATH AND CORONARY ANGIOGRAPHY N/A 06/27/2021   Procedure: LEFT HEART CATH AND CORONARY ANGIOGRAPHY;  Surgeon: Nigel Mormon, MD;  Location: [facility];  Service: Cardiovascular;  Laterality: N/A;   Patient Active Problem List   Diagnosis Date Noted   Vitamin D deficiency 09/07/2021   Cognitive impairment 09/06/2021   Coronary artery disease    HFrEF (heart failure with reduced ejection fraction) ([place]) 05/10/2021   Encephalopathy acute 03/11/2021   Seizures ([place]) 02/24/2021   Aortic atherosclerosis ([place]) 11/22/2020   History of seizures 11/22/2020   Alcohol use disorder, severe, dependence ([place]) 11/22/2020   Type 2 diabetes mellitus ([place]) 11/22/2020   Anemia 11/22/2020   Hyperlipidemia 11/22/2020   Acute encephalopathy 10/16/2020   Essential hypertension 10/08/2019   History of CVA (cerebrovascular accident) 10/02/2019    ONSET DATE: February 24, 2021    REFERRING DIAG: R56.9 (ICD-10-CM) - Seizures ([place]) Z86.73 (ICD-10-CM) - History of CVA (cerebrovascular accident)  THERAPY DIAG:  Aphasia  Cognitive communication deficit  SUBJECTIVE: "I talk like a baby"  PAIN:  Are you having pain? No OBJECTIVE:  TODAY'S TREATMENT:  10-16-21: Pt endorsed the "tray" as rationale for earlier missed appointment. SLP deduced traffic as pt unable to self-correct or describe with any more detail due to perseverations. Pt able to repeat successfully with mod A. SLP trialed VNeST today with max A required for [facility]- questions with SLP providing usual examples to aid comprehension. Will continue to target VNeST in ST sessions prior to adding to HEP. SLP targeted word associations with usual mod to max A required to aid naming and correction of articulation errors. Visual cues and syllable segmentation were most beneficial for articulatory accuracy.   10-12-21: SLP provides education on supportive communication for people with aphasia. Provide written handout for pt to share with communication partners. During informal conversation, pt with usual impaired comprehension of questions/comments, demonstrated through inappropriate responses. Target convergent naming, pt accurate 6/10 trials with usual repetition and written support. Able to generate appropriate sentence with target word 9/10 trials with rare min-A, though demonstrating some semantic paraphasias. Awareness of, without ability to correct without support from [place]. Syllable segmentation and choral production beneficially for 3+ syllable words. Target anomia compensation of describing. With mod to max-A, in form  of questioning cues and phonemic cues, able to describe x3 target words. Update pt HEP.   10-06-21: Significant difficulty discussing recent events, family members, and favorite television shows noted. Usual perseverations and semantic and phonemic paraphasias noted. SLP trialed writing and cued  description to aid dysnomia, which was mildly effective. SLP targeted functional divergent naming of personally relevant items to optimize functional communication. Intermittent mod to max A required to aid articulatory precision due to inconsistent production. Pt benefited from SLP modeling and cued repetition at word level. Targeted simple sentence generation with targeted words re: fishing hobby, in which errors increased as task progressed due to perseveration. HEP provided. Communication Participation Item Bank (CPIB) completed today with score of 8. Intermittent repetition and rephrasing required to aid patient comprehension. CLQT deferred due to severity of speech and language deficits.    09-29-2021: Pt arrives late to session, reports d/t transportation. Advised pt of need to attend therapy appointments regularly to make progress towards therapy goals. Target orientation through use of calendar with days marked off. Requires max-A to relay date despite visual cues. Errorless learning and spaced retrieval up to 3 minute delay to accurately ID today's date. Provided written instructions on implementing orientation routine for home. Target verbal expression in simple naming task. Able to ID x4 verbs then list x3 related items for each with usual max-A. Usual auditory comprehension impairment observed. Usual apraxia errors during verbal expression. Benefits from segmentation and direct model.      PATIENT EDUCATION: Education details: see above Person educated: Patient  Education method: Explanation, Demonstration, and Handouts Education comprehension: verbalized understanding, returned demonstration, and needs further education         GOALS: Goals reviewed with patient? Yes   SHORT TERM GOALS: Target date: 10/13/2021   Pt will complete standardized assessment for cognition, language and PROM within first two therapy sessions Baseline: initiated CLQT (deferred CLQT due to aphasia;  CPIB=8) Goal status: Partially Met   2.  Pt will complete structured language tasks (e.g. VNeST, SFA, RET) with 80% accuracy, with mod-A over 2 sessions Baseline: language score on CLQT (15) indicates significant impairment; 10-12-21 Goal status: NOT MET   3.  Pt's spouse/caregiver/care partner will provide appropriate cues to assist pt with verbal output given occasional min A over 2 sessions Baseline: caregiver has not received training on types of cues or cueing hierarchy; 10-12-21 Goal status: NOT MET   4.  Pt will verbalize and implement 2 attention and memory compensations to aid daily functioning given occasional min A over 2 sessions Baseline: none being used; 09-29-21, 10-12-21 Goal status: PARTIALLY MET     LONG TERM GOALS: Target date: 12/08/2021   Pt will report subjective improvement in communication abilities via PROM by d/c  Baseline: PROM to be administered first therapy session; CPIB: 8 Goal status: ongoing   2.  Pt and spouse will report compliance with HEP targeting expressive language with mod-I over 1 week period  Baseline: HEP to be established Goal status: ongoing   3.  Pt and spouse will report improved participation in x2 preferred activities as home with use of trained compensations over 1 week period Baseline: report pt is not participating in preferred activities or household chores, limited to sitting on porch and watching TV Goal status: ongoing   4.  Pt will carryover 2 stratregies for memory to recall conversations and pertinent events with family over 1 week Baseline:  Goal status: ongoing     ASSESSMENT:   CLINICAL IMPRESSION:  Patient is a 61 y.o. M who was seen today for cognitive communication deficits and aphasia s/p CVA + seizure. Initiated education and instruction for speech and language strategies/tasks to optimize functional communication, which is notably impaired. Writing was affected at word level, even copying. Mod to max visual cues and  syllable segmentation were effective to aid articulatory precision at word level. I recommend skilled ST to address expressive language and cognitive deficits.    OBJECTIVE IMPAIRMENTS include attention, memory, awareness, executive functioning, expressive language, and aphasia. These impairments are limiting patient from return to work, managing medications, managing appointments, managing finances, household responsibilities, ADLs/IADLs, and effectively communicating at home and in community. Factors affecting potential to achieve goals and functional outcome are ability to learn/carryover information and severity of impairments.. Patient will benefit from skilled SLP services to address above impairments and improve overall function.   REHAB POTENTIAL: Fair dependent on participation in [place], frequency at which pt can attend therapy   PLAN: SLP FREQUENCY: 2x/week   SLP DURATION: 12 weeks   PLANNED INTERVENTIONS: Language facilitation, Environmental controls, Cueing hierachy, Cognitive reorganization, Internal/external aids, Functional tasks, Multimodal communication approach, SLP instruction and feedback, Compensatory strategies, and Patient/family education  Marzetta Board, [place] 10/16/2021, 1:59 PM

## 2021-10-17 ENCOUNTER — Other Ambulatory Visit: Payer: Medicaid Other

## 2021-10-18 ENCOUNTER — Encounter: Payer: Medicaid Other | Admitting: Speech Pathology

## 2021-10-18 DIAGNOSIS — R4189 Other symptoms and signs involving cognitive functions and awareness: Secondary | ICD-10-CM | POA: Diagnosis not present

## 2021-10-19 ENCOUNTER — Other Ambulatory Visit: Payer: Self-pay | Admitting: Pharmacist

## 2021-10-19 ENCOUNTER — Ambulatory Visit: Payer: Self-pay

## 2021-10-19 DIAGNOSIS — R4189 Other symptoms and signs involving cognitive functions and awareness: Secondary | ICD-10-CM | POA: Diagnosis not present

## 2021-10-19 DIAGNOSIS — I10 Essential (primary) hypertension: Secondary | ICD-10-CM

## 2021-10-19 MED ORDER — BLOOD PRESSURE MONITOR KIT: PACK | 0 refills | Status: AC

## 2021-10-19 NOTE — Chronic Care Management (AMB) (Signed)
Chief Complaint  Patient presents with   High Risk Managed Medicaid    Arthur [name] is a 61 y.o. year old male who presented for a telephone visit.   They were referred to the pharmacist by their High Risk Managed Medicaid Care Team  for assistance in managing complex medication management.   Patient is participating in a Managed Medicaid Plan:  Yes  Subjective:  Care Team: Primary Care Provider: [name], MD ; Next Scheduled Visit: not scheduled Cardiologist: Elvin So; Next Scheduled Visit: 11/27/21 Neurologist: Marjory Lies; Next Scheduled Visit: 01/23/22  Medication Access/Adherence  Current Pharmacy:  [facility] [address], [address] [place] [address] Phone: [phone] Fax: [phone]  [name] [address] [place] [address] Phone: [phone] Fax: [phone]  [name] [address] [place] [address] Phone: [phone] Fax: [phone]  [facility] - [place], [place] - [address] [place] [address] Phone: [phone] Fax: [phone]   Patient reports affordability concerns with their medications: No  Patient reports access/transportation concerns to their pharmacy: No  Patient reports adherence concerns with their medications:  No     Diabetes:  Current medications: metformin 1000 mg twice daily  Current glucose readings: not checking, patient does not like having finger sticks per [name]. She asks about an order for a Libre.   Hyperlipidemia/ASCVD Risk Reduction  Current lipid lowering medications: atorvastatin 40 mg daily  Antiplatelet regimen: not taking aspirin 81 mg daily right now, but reports they have a bottle at home  Heart Failure:  Current medications:  ACEi/ARB/ARNI: Entresto 49/51 mg daily  SGLT2i: none, consider moving forward Beta  blocker: metoprolol tartrate 50 mg twice daily Mineralocorticoid Receptor Antagonist: none, consider moving forward Diuretic regimen: none; Additional antihypertensives: amlodipine 10 mg daily; hydralazine/isosorbide 50/30 mg three times daily  Current home blood pressure readings: does not have a machine  Upcoming ECHO next week   Health Maintenance  Health Maintenance Due  Topic Date Due   FOOT EXAM  Never done   OPHTHALMOLOGY EXAM  Never done   TETANUS/TDAP  Never done   COLONOSCOPY (Pts 45-19yrs Insurance coverage will need to be confirmed)  Never done   Zoster Vaccines- Shingrix (1 of 2) Never done   COVID-19 Vaccine (2 - Pfizer series) 03/01/2020     Objective: Lab Results  Component Value Date   HGBA1C 7.1 (H) 09/05/2021    Lab Results  Component Value Date   CREATININE 1.29 (H) 09/05/2021   BUN 20 09/05/2021   NA 139 09/05/2021   K 4.4 09/05/2021   CL 101 09/05/2021   CO2 24 09/05/2021    Lab Results  Component Value Date   CHOL 147 03/23/2021   HDL 36 (L) 03/23/2021   LDLCALC 86 03/23/2021   LDLDIRECT 124.3 (H) 10/17/2020   TRIG 127 03/23/2021   CHOLHDL 4.1 03/23/2021    Medications Reviewed Today     Reviewed by Alden Hipp, RPH-CPP (Pharmacist) on 10/19/21 at 1024  Med List Status: <None>   Medication Order Taking? Sig Documenting Provider Last Dose Status Informant  amLODipine (NORVASC) 10 MG tablet [id number] Yes Take 1 tablet (10 mg total) by mouth daily. [name], [name], PA-C Taking Active Spouse/Significant Other  aspirin EC 81 MG tablet [id number] No Take 1 tablet (81 mg total) by mouth daily. Swallow whole.  Patient not taking:  Reported on 10/19/2021   [name], PA-C Not Taking Active   atorvastatin (LIPITOR) 40 MG tablet [id number] Yes Take 1 tablet (40 mg total) by mouth daily. [name], [name], PA-C Taking Active Spouse/Significant Other  cholecalciferol (VITAMIN D) 25 MCG (1000 UNIT) tablet [id number] Yes Take by  mouth.  Taking Active   hydrALAZINE (APRESOLINE) 50 MG tablet [id number] Yes Take 1 tablet (50 mg total) by mouth every 8 (eight) hours. [name], [name], PA-C Taking Active Spouse/Significant Other  isosorbide dinitrate (ISORDIL) 30 MG tablet [id number] Yes Take 1 tablet (30 mg total) by mouth 3 (three) times daily. [name], [name], PA-C Taking Active Spouse/Significant Other  lacosamide (VIMPAT) 200 MG TABS tablet [id number] Yes Take 1 tablet (200 mg total) by mouth 2 (two) times daily. Ocie Doyne, MD Taking Active   levETIRAcetam (KEPPRA) 750 MG tablet [id number] Yes Take 2 tablets (1,500 mg total) by mouth 2 (two) times daily. Eliezer Bottom, MD Taking Active   metFORMIN (GLUCOPHAGE) 500 MG tablet [id number] Yes Take 2 tablets (1,000 mg total) by mouth 2 (two) times daily with a meal. Aslam, Sadia, MD Taking Active   metoprolol tartrate (LOPRESSOR) 50 MG tablet [id number] Yes Take 1 tablet (50 mg total) by mouth 2 (two) times daily. [name], [name], PA-C Taking Active Spouse/Significant Other  sacubitril-valsartan (ENTRESTO) 49-51 MG [id number] Yes Take 1 tablet by mouth 2 (two) times daily. Start on 06/29/2021 Elder [name], MD Taking Active            Med Note [name]   Tue Aug 22, 2021  3:45 PM)    Vitamin D, Cholecalciferol, 25 MCG (1000 UT) CAPS [id number]  Take 1 tablet by mouth daily. Eliezer Bottom, MD  Active   Med List Note [name], CPhT 02/24/21 1756): Vimpat: "12/08/20 receiving PAP through [employer], approved until 12/08/2022"            SDOH Interventions    Flowsheet Row Most Recent Value  SDOH Interventions   Financial Strain Interventions Intervention Not Indicated       Assessment/Plan:  Care Plan : Medication Management  Updates made by Alden Hipp, RPH-CPP since 10/19/2021 12:00 AM     Problem: Medication Access      Long-Range Goal: Maintain Provider Appointments and Stay Adherent to Medications   Note:   Current  Barriers:  Unable to independently afford treatment regimen Does not contact provider office for questions/concerns No transportation to pharmacy  Patient Needs: Scheduled follow up with primary care provider Improved collaboration with dispensing pharmacy Transportation to pharmacy and appointments  Patient Activities: Patient will:  - Call [facility] Clinic to reschedule lab appointment      Diabetes: - Currently uncontrolled - Reviewed that Medicaid will only cover CGM if patient on 2 doses of insulin daily. Discussed that it would be appropriate to focus on medication adherence, dietary modifications and follow A1c if patient averse to fingersticks. If next A1c increased, I would advise resumption of fingerstick readings.  - Recommend to continue current regimen at this time - Moving forward, advise SGLT2 for dual DM/HF benefit   Heart Failure: - Currently with opportunity for optimization - Reviewed appropriate blood pressure monitoring technique and reviewed goal blood pressure. Placed order to [facility]. They will deliver to patient tomorrow.  - Reminded about upcoming ECHO.  - Continue current regimen at this time   Follow Up Plan: phone call in 6 weeks  Catie Eppie Gibson, PharmD, BCACP [facility] [phone]

## 2021-10-19 NOTE — Patient Instructions (Signed)
Arthur [name] and Arthur [name],   It was great talking to you today!  1) Set up transportation to the ECHO imaging of your heart on Tuesday.  2) Schedule follow up in July with the Internal Medicine Clinic.   [facility] will deliver the blood pressure machine. Check your blood pressure once daily, and any time you have concerning symptoms like headache, chest pain, dizziness, shortness of breath, or vision changes.   Our goal is less than 130/80.  To appropriately check your blood pressure, make sure you do the following:  1) Avoid caffeine, exercise, or tobacco products for 30 minutes before checking. Empty your bladder. 2) Sit with your back supported in a flat-backed chair. Rest your arm on something flat (arm of the chair, table, etc). 3) Sit still with your feet flat on the floor, resting, for at least 5 minutes.  4) Check your blood pressure. Take 1-2 readings.  5) Write down these readings and bring with you to any provider appointments.  Bring your home blood pressure machine with you to a provider's office for accuracy comparison at least once a year.   Make sure you take your blood pressure medications before you come to any office visit, even if you were asked to fast for labs.  Take care!  Catie Eppie Gibson, PharmD, [facility] [phone]

## 2021-10-20 ENCOUNTER — Ambulatory Visit: Payer: Medicaid Other | Admitting: Speech Pathology

## 2021-10-20 DIAGNOSIS — R4701 Aphasia: Secondary | ICD-10-CM | POA: Diagnosis not present

## 2021-10-20 DIAGNOSIS — R4189 Other symptoms and signs involving cognitive functions and awareness: Secondary | ICD-10-CM | POA: Diagnosis not present

## 2021-10-20 DIAGNOSIS — R41841 Cognitive communication deficit: Secondary | ICD-10-CM

## 2021-10-20 NOTE — Therapy (Signed)
OUTPATIENT SPEECH LANGUAGE PATHOLOGY TREATMENT NOTE   Patient Name: Arthur [name] MRN: [id number] DOB:[birth year], 61 y.o., male Today's Date: 10/20/2021  PCP: [name], MD REFERRING PROVIDER: Angelica Pou, MD   END OF SESSION:   End of Session - 10/20/21 0937     Visit Number 6    Number of Visits 17    Date for SLP Re-Evaluation 12/07/21    Authorization Type [employer] Medicaid (no auth; 27 visits max)    SLP Start Time [phone]    SLP Stop Time  5056    SLP Time Calculation (min) 42 min    Activity Tolerance Patient tolerated treatment well              Past Medical History:  Diagnosis Date   DM2 (diabetes mellitus, type 2) ([place])    ETOH abuse    Hypertension    Seizures ([place])    Transaminitis    Past Surgical History:  Procedure Laterality Date   HEMORRHOID SURGERY     INTRAVASCULAR PRESSURE WIRE/FFR STUDY N/A 06/27/2021   Procedure: INTRAVASCULAR PRESSURE WIRE/FFR STUDY;  Surgeon: Nigel Mormon, MD;  Location: [facility];  Service: Cardiovascular;  Laterality: N/A;   LEFT HEART CATH AND CORONARY ANGIOGRAPHY N/A 06/27/2021   Procedure: LEFT HEART CATH AND CORONARY ANGIOGRAPHY;  Surgeon: Nigel Mormon, MD;  Location: [facility];  Service: Cardiovascular;  Laterality: N/A;   Patient Active Problem List   Diagnosis Date Noted   Vitamin D deficiency 09/07/2021   Cognitive impairment 09/06/2021   Coronary artery disease    HFrEF (heart failure with reduced ejection fraction) ([place]) 05/10/2021   Encephalopathy acute 03/11/2021   Seizures ([place]) 02/24/2021   Aortic atherosclerosis ([place]) 11/22/2020   History of seizures 11/22/2020   Alcohol use disorder, severe, dependence ([place]) 11/22/2020   Type 2 diabetes mellitus ([place]) 11/22/2020   Anemia 11/22/2020   Hyperlipidemia 11/22/2020   Acute encephalopathy 10/16/2020   Essential hypertension 10/08/2019   History of CVA (cerebrovascular accident) 10/02/2019    ONSET DATE: February 24, 2021    REFERRING DIAG: R56.9 (ICD-10-CM) - Seizures ([place]) Z86.73 (ICD-10-CM) - History of CVA (cerebrovascular accident)  THERAPY DIAG:  Aphasia  Cognitive communication deficit  SUBJECTIVE: "what you going to want today"  PAIN:  Are you having pain? No OBJECTIVE:  TODAY'S TREATMENT:  10-19-21: Targeted word retrieval through [place]. Given category of "sports" pt able to name x5 IND, x2 additional given semantic cues. Using picture stimuli, able to name 10/12 using usual mod-A. Given category of "football," pt generated x14 related items including verbs, nouns, and team names with usual mod to max-A. Intermittent success of semantic and phonemic cueing. Usual use of providing model required for pt to write target words, limited focused and selective attention evidenced by frequently looking out window and missing letters when copying. Target motor speech production re: apraxia using direct model and mass practice for generated targets. ST provides usual model then fades to pt using in response to ?[name] Given above mentioned level of support, able to accurately produce 10/11 target words which pt initial struggled to produce.   10-16-21: Pt endorsed the "tray" as rationale for earlier missed appointment. SLP deduced traffic as pt unable to self-correct or describe with any more detail due to perseverations. Pt able to repeat successfully with mod A. SLP trialed VNeST today with max A required for [facility]- questions with SLP providing usual examples to aid comprehension. Will continue to  target VNeST in ST sessions prior to adding to HEP. SLP targeted word associations with usual mod to max A required to aid naming and correction of articulation errors. Visual cues and syllable segmentation were most beneficial for articulatory accuracy.     PATIENT EDUCATION: Education details: see above Person educated: Patient  Education method: Explanation, Demonstration, and Handouts Education  comprehension: verbalized understanding, returned demonstration, and needs further education         GOALS: Goals reviewed with patient? Yes   SHORT TERM GOALS: Target date: 10/13/2021   Pt will complete standardized assessment for cognition, language and PROM within first two therapy sessions Baseline: initiated CLQT (deferred CLQT due to aphasia; CPIB=8) Goal status: Partially Met   2.  Pt will complete structured language tasks (e.g. VNeST, SFA, RET) with 80% accuracy, with mod-A over 2 sessions Baseline: language score on CLQT (15) indicates significant impairment; 10-12-21 Goal status: NOT MET   3.  Pt's spouse/caregiver/care partner will provide appropriate cues to assist pt with verbal output given occasional min A over 2 sessions Baseline: caregiver has not received training on types of cues or cueing hierarchy; 10-12-21 Goal status: NOT MET   4.  Pt will verbalize and implement 2 attention and memory compensations to aid daily functioning given occasional min A over 2 sessions Baseline: none being used; 09-29-21, 10-12-21 Goal status: PARTIALLY MET     LONG TERM GOALS: Target date: 12/08/2021   Pt will report subjective improvement in communication abilities via PROM by d/c  Baseline: PROM to be administered first therapy session; CPIB: 8 Goal status: ongoing   2.  Pt and spouse will report compliance with HEP targeting expressive language with mod-I over 1 week period  Baseline: HEP to be established Goal status: ongoing   3.  Pt and spouse will report improved participation in x2 preferred activities as home with use of trained compensations over 1 week period Baseline: report pt is not participating in preferred activities or household chores, limited to sitting on porch and watching TV Goal status: ongoing   4.  Pt will carryover 2 stratregies for memory to recall conversations and pertinent events with family over 1 week Baseline:  Goal status: ongoing      ASSESSMENT:   CLINICAL IMPRESSION: Patient is a 61 y.o. M who was seen today for cognitive communication deficits and aphasia s/p CVA + seizure. Initiated education and instruction for speech and language strategies/tasks to optimize functional communication, which is notably impaired. Writing was affected at word level, even copying. Mod to max visual cues and syllable segmentation were effective to aid articulatory precision at word level. I recommend skilled ST to address expressive language and cognitive deficits.    OBJECTIVE IMPAIRMENTS include attention, memory, awareness, executive functioning, expressive language, and aphasia. These impairments are limiting patient from return to work, managing medications, managing appointments, managing finances, household responsibilities, ADLs/IADLs, and effectively communicating at home and in community. Factors affecting potential to achieve goals and functional outcome are ability to learn/carryover information and severity of impairments.. Patient will benefit from skilled SLP services to address above impairments and improve overall function.   REHAB POTENTIAL: Fair dependent on participation in [employer], frequency at which pt can attend therapy   PLAN: SLP FREQUENCY: 2x/week   SLP DURATION: 12 weeks   PLANNED INTERVENTIONS: Language facilitation, Environmental controls, Cueing hierachy, Cognitive reorganization, Internal/external aids, Functional tasks, Multimodal communication approach, SLP instruction and feedback, Compensatory strategies, and Patient/family education  Su Monks, [place] 10/20/2021,  9:39 AM

## 2021-10-20 NOTE — Patient Instructions (Signed)
Today we practiced naming Sports and items related to football. Practice saying the words we practiced today. [name], model for him as needed to get accurate production.   Sports:   Football [employer] + picture list  Football:  Helmet [place] Field Ball Kick Catch Throw Run [employer] [place] [employer] [place]  I like watching football. I like their jerseys. Cowboy's jerseys are light blue and light gray.   Homework is the following. [name], please write out items [place] names and practice saying the items.   List items related to BASKETBALL:   Levan Hurst  2.  Michael [place]  3.    4.    5.     6.   7.    8.     9.     10.     Try and make sentences to talk about basketball:     2.    3.   4.   5.

## 2021-10-24 ENCOUNTER — Other Ambulatory Visit: Payer: Medicaid Other

## 2021-10-24 DIAGNOSIS — R4189 Other symptoms and signs involving cognitive functions and awareness: Secondary | ICD-10-CM | POA: Diagnosis not present

## 2021-10-25 ENCOUNTER — Ambulatory Visit: Payer: Self-pay

## 2021-10-25 ENCOUNTER — Encounter: Payer: Medicaid Other | Admitting: Speech Pathology

## 2021-10-25 DIAGNOSIS — R4189 Other symptoms and signs involving cognitive functions and awareness: Secondary | ICD-10-CM | POA: Diagnosis not present

## 2021-10-26 ENCOUNTER — Other Ambulatory Visit: Payer: Medicaid Other

## 2021-10-26 DIAGNOSIS — R4189 Other symptoms and signs involving cognitive functions and awareness: Secondary | ICD-10-CM | POA: Diagnosis not present

## 2021-10-27 ENCOUNTER — Ambulatory Visit: Payer: Medicaid Other | Attending: Internal Medicine

## 2021-10-27 ENCOUNTER — Telehealth: Payer: Self-pay

## 2021-10-27 DIAGNOSIS — R41841 Cognitive communication deficit: Secondary | ICD-10-CM | POA: Insufficient documentation

## 2021-10-27 DIAGNOSIS — R4701 Aphasia: Secondary | ICD-10-CM | POA: Diagnosis present

## 2021-10-27 NOTE — Telephone Encounter (Signed)
Patient Name: Arthur [name] MRN: [id number] DOB:1961/05/18, 62 y.o., male Today's Date: 10/27/2021  Spoke to wife [name] regarding patient's caregiver concerns. Caregiver drops patient off to therapy and is expecting front staff and therapists to call her when he is doing with therapy. Due to patient's cognitive impairments, we are expecting his caregiver to be either present during the therapy session or at least wait in the waiting area until his therapy is done. We have already incident where he was left alone and his caregiver wasn't present where he tried to go in someone else's car. Caregiver was notified of this before previously. Our front desk staff and therapist can be busy and may not have time to call caregiver after every session to have him be picked up as it will be cutting in to his valuable treatment time. We want a smooth transition from therapist to caregiver where patient is supervised 100% of the time to improve safety of patient. Wife verbalized understanding and she was recommended to speak to caregiver or her agency to set forth expectations.   Ileana Ladd, PT 10/27/2021, 10:28 AM

## 2021-10-27 NOTE — Patient Instructions (Addendum)
Please bring back homework to next Speech Therapy session   Ways to help Arthur [name] communicate:  Give him time to process and let him try to correct any errors If he needs help, try giving him the first sound of the word (ex: banana, say "buh..." If the word still comes out wrong, help him by sounding out the word (ex: vanilla "vuh-nilla")

## 2021-10-27 NOTE — Therapy (Signed)
OUTPATIENT SPEECH LANGUAGE PATHOLOGY TREATMENT NOTE   Patient Name: Arthur [name] MRN: [id number] DOB:18-Jul-1960, 61 y.o., male Today's Date: 10/27/2021  PCP: [name], MD REFERRING PROVIDER: Angelica Pou, MD   END OF SESSION:   End of Session - 10/27/21 0920     Visit Number 7    Number of Visits 17    Date for SLP Re-Evaluation 12/07/21    Authorization Type [employer] Medicaid (no auth; 27 visits max)    SLP Start Time 0930    SLP Stop Time  1015    SLP Time Calculation (min) 45 min    Activity Tolerance Patient tolerated treatment well               Past Medical History:  Diagnosis Date   DM2 (diabetes mellitus, type 2) ([place])    ETOH abuse    Hypertension    Seizures ([place])    Transaminitis    Past Surgical History:  Procedure Laterality Date   HEMORRHOID SURGERY     INTRAVASCULAR PRESSURE WIRE/FFR STUDY N/A 06/27/2021   Procedure: INTRAVASCULAR PRESSURE WIRE/FFR STUDY;  Surgeon: Nigel Mormon, MD;  Location: [facility];  Service: Cardiovascular;  Laterality: N/A;   LEFT HEART CATH AND CORONARY ANGIOGRAPHY N/A 06/27/2021   Procedure: LEFT HEART CATH AND CORONARY ANGIOGRAPHY;  Surgeon: Nigel Mormon, MD;  Location: [facility];  Service: Cardiovascular;  Laterality: N/A;   Patient Active Problem List   Diagnosis Date Noted   Vitamin D deficiency 09/07/2021   Cognitive impairment 09/06/2021   Coronary artery disease    HFrEF (heart failure with reduced ejection fraction) ([place]) 05/10/2021   Encephalopathy acute 03/11/2021   Seizures ([place]) 02/24/2021   Aortic atherosclerosis ([place]) 11/22/2020   History of seizures 11/22/2020   Alcohol use disorder, severe, dependence ([place]) 11/22/2020   Type 2 diabetes mellitus ([place]) 11/22/2020   Anemia 11/22/2020   Hyperlipidemia 11/22/2020   Acute encephalopathy 10/16/2020   Essential hypertension 10/08/2019   History of CVA (cerebrovascular accident) 10/02/2019    ONSET DATE: February 24, 2021    REFERRING DIAG: R56.9 (ICD-10-CM) - Seizures ([place]) Z86.73 (ICD-10-CM) - History of CVA (cerebrovascular accident)  THERAPY DIAG:  Aphasia  Cognitive communication deficit  SUBJECTIVE: "feel a little bit better"  PAIN:  Are you having pain? No OBJECTIVE:  TODAY'S TREATMENT:  10-27-21: Functional verbal expression noted with improvements today, given less frequent articulation/apraxic errors and anomia. Pt able to occasionally self-correct at word level. Benefited from phonemic cues, visual cues, and occasionally from semantic cues to aid word naming and articulation. Pt independently used gesture x1. SLP conducted SFA training today to target use of description strategy for anomia and functional naming. Pt required intermittent multiple choice to ID group, semantic cues for descriptions, and usual prompting to ID personal associations. Occasional min to mod A required to aid reading at word level. SLP provided SFA for HEP and provided communication recommendations for caregivers (handout provided).   10-19-21: Targeted word retrieval through [place]. Given category of "sports" pt able to name x5 IND, x2 additional given semantic cues. Using picture stimuli, able to name 10/12 using usual mod-A. Given category of "football," pt generated x14 related items including verbs, nouns, and team names with usual mod to max-A. Intermittent success of semantic and phonemic cueing. Usual use of providing model required for pt to write target words, limited focused and selective attention evidenced by frequently looking out window and missing letters when copying.  Target motor speech production re: apraxia using direct model and mass practice for generated targets. ST provides usual model then fades to pt using in response to ?[name] Given above mentioned level of support, able to accurately produce 10/11 target words which pt initial struggled to produce.   10-16-21: Pt endorsed the "tray"  as rationale for earlier missed appointment. SLP deduced traffic as pt unable to self-correct or describe with any more detail due to perseverations. Pt able to repeat successfully with mod A. SLP trialed VNeST today with max A required for [facility]- questions with SLP providing usual examples to aid comprehension. Will continue to target VNeST in ST sessions prior to adding to HEP. SLP targeted word associations with usual mod to max A required to aid naming and correction of articulation errors. Visual cues and syllable segmentation were most beneficial for articulatory accuracy.     PATIENT EDUCATION: Education details: see above Person educated: Patient  Education method: Explanation, Demonstration, and Handouts Education comprehension: verbalized understanding, returned demonstration, and needs further education         GOALS: Goals reviewed with patient? Yes   SHORT TERM GOALS: Target date: 10/13/2021   Pt will complete standardized assessment for cognition, language and PROM within first two therapy sessions Baseline: initiated CLQT (deferred CLQT due to aphasia; CPIB=8) Goal status: Partially Met   2.  Pt will complete structured language tasks (e.g. VNeST, SFA, RET) with 80% accuracy, with mod-A over 2 sessions Baseline: language score on CLQT (15) indicates significant impairment; 10-12-21 Goal status: NOT MET   3.  Pt's spouse/caregiver/care partner will provide appropriate cues to assist pt with verbal output given occasional min A over 2 sessions Baseline: caregiver has not received training on types of cues or cueing hierarchy; 10-12-21 Goal status: NOT MET   4.  Pt will verbalize and implement 2 attention and memory compensations to aid daily functioning given occasional min A over 2 sessions Baseline: none being used; 09-29-21, 10-12-21 Goal status: PARTIALLY MET     LONG TERM GOALS: Target date: 12/08/2021   Pt will report subjective improvement in communication abilities via  PROM by d/c  Baseline: PROM to be administered first therapy session; CPIB: 8 Goal status: ongoing   2.  Pt and spouse will report compliance with HEP targeting expressive language with mod-I over 1 week period  Baseline: HEP to be established Goal status: ongoing   3.  Pt and spouse will report improved participation in x2 preferred activities as home with use of trained compensations over 1 week period Baseline: report pt is not participating in preferred activities or household chores, limited to sitting on porch and watching TV Goal status: ongoing   4.  Pt will carryover 2 stratregies for memory to recall conversations and pertinent events with family over 1 week Baseline:  Goal status: ongoing     ASSESSMENT:   CLINICAL IMPRESSION: Patient is a 61 y.o. M who was seen today for cognitive communication deficits and aphasia s/p CVA + seizure. Conducted ongoing education and instruction for speech and language strategies/tasks to optimize functional communication. Phonemic, semantic, and visual cues were effective to aid naming and articulation. Min to mod A was beneficial for syllable segmentation to aid articulatory precision at word level. I recommend skilled ST to address expressive language and cognitive deficits.    OBJECTIVE IMPAIRMENTS include attention, memory, awareness, executive functioning, expressive language, and aphasia. These impairments are limiting patient from return to work, managing medications, managing appointments, managing finances,  household responsibilities, ADLs/IADLs, and effectively communicating at home and in community. Factors affecting potential to achieve goals and functional outcome are ability to learn/carryover information and severity of impairments.. Patient will benefit from skilled SLP services to address above impairments and improve overall function.   REHAB POTENTIAL: Fair dependent on participation in [place], frequency at which pt can attend  therapy   PLAN: SLP FREQUENCY: 2x/week   SLP DURATION: 12 weeks   PLANNED INTERVENTIONS: Language facilitation, Environmental controls, Cueing hierachy, Cognitive reorganization, Internal/external aids, Functional tasks, Multimodal communication approach, SLP instruction and feedback, Compensatory strategies, and Patient/family education  Marzetta Board, [place] 10/27/2021, 9:20 AM

## 2021-10-30 ENCOUNTER — Ambulatory Visit: Payer: Medicaid Other

## 2021-10-31 ENCOUNTER — Other Ambulatory Visit: Payer: Self-pay

## 2021-10-31 NOTE — Patient Outreach (Cosign Needed)
Medicaid Managed Care   Nurse Care Manager Note  10/31/2021 Name:  Arthur [name] MRN:  [id number] DOB:  1960-08-27  Arthur [name] is an 61 y.o. year old male who is a primary patient of Arthur Manes, MD.  The [facility] Managed Care Coordination team was consulted for assistance with:    CAD HTN DMII Seizures  Mr. Mcmeen wife, Arthur [name], was given information about Medicaid Managed Care Coordination team services today. Arthur [name] Primary Caregiver agreed to services and verbal consent obtained.  Engaged with patient by telephone for follow up visit in response to provider referral for case management and/or care coordination services.   Assessments/Interventions:  Review of past medical history, allergies, medications, health status, including review of consultants reports, laboratory and other test data, was performed as part of comprehensive evaluation and provision of chronic care management services.  SDOH (Social Determinants of Health) assessments and interventions performed:   Care Plan  Allergies  Allergen Reactions   Penicillins Hives    Did it involve swelling of the face/tongue/throat, SOB, or low BP? Y Did it involve sudden or severe rash/hives, skin peeling, or any reaction on the inside of your mouth or nose? N Did you need to seek medical attention at a hospital or doctor's office? Y When did it last happen?  Over 5 Years Ago     If all above answers are "NO", may proceed with cephalosporin use.     Medications Reviewed Today     Reviewed by [name], RN (Case Manager) on 10/31/21 at 60  Med List Status: <None>   Medication Order Taking? Sig Documenting Provider Last Dose Status Informant  amLODipine (NORVASC) 10 MG tablet [id number] No Take 1 tablet (10 mg total) by mouth daily. [name], [name], PA-C Taking Active Spouse/Significant Other  aspirin EC 81 MG tablet [id number] No Take 1 tablet (81 mg total) by mouth daily. Swallow whole.   Patient not taking: Reported on 10/19/2021   Alethia Berthold, PA-C Not Taking Active   atorvastatin (LIPITOR) 40 MG tablet [id number] No Take 1 tablet (40 mg total) by mouth daily. [name], [name], PA-C Taking Active Spouse/Significant Other  Blood Pressure Monitor KIT [id number]  Use to check blood pressure dailly. Arthur Manes, MD  Active   cholecalciferol (VITAMIN D) 25 MCG (1000 UNIT) tablet [id number] No Take by mouth.  Taking Active   hydrALAZINE (APRESOLINE) 50 MG tablet [id number] No Take 1 tablet (50 mg total) by mouth every 8 (eight) hours. [name], [name], PA-C Taking Active Spouse/Significant Other  isosorbide dinitrate (ISORDIL) 30 MG tablet [id number] No Take 1 tablet (30 mg total) by mouth 3 (three) times daily. [name], [name], PA-C Taking Active Spouse/Significant Other  lacosamide (VIMPAT) 200 MG TABS tablet [id number] No Take 1 tablet (200 mg total) by mouth 2 (two) times daily. [name], MD Taking Active   levETIRAcetam (KEPPRA) 750 MG tablet [id number] No Take 2 tablets (1,500 mg total) by mouth 2 (two) times daily. [name], MD Taking Active   metFORMIN (GLUCOPHAGE) 500 MG tablet [id number] No Take 2 tablets (1,000 mg total) by mouth 2 (two) times daily with a meal. Aslam, Sadia, MD Taking Active   metoprolol tartrate (LOPRESSOR) 50 MG tablet [id number] No Take 1 tablet (50 mg total) by mouth 2 (two) times daily. [name], [name], PA-C Taking Active Spouse/Significant Other  sacubitril-valsartan (ENTRESTO) 49-51 MG [id number] No Take 1 tablet by mouth 2 (two) times daily. Start on 06/29/2021 [name]  J, MD Taking Active            Med Note Arthur [name], [place] Aug 22, 2021  3:45 PM)    Vitamin D, Cholecalciferol, 25 MCG (1000 UT) CAPS [id number] No Take 1 tablet by mouth daily. [name], MD Taking Active   Med List Note Arthur [name], CPhT 02/24/21 1756): [place]: "12/08/20 receiving PAP through [employer], approved until 12/08/2022"             Patient Active Problem List   Diagnosis Date Noted   Vitamin D deficiency 09/07/2021   Cognitive impairment 09/06/2021   Coronary artery disease    HFrEF (heart failure with reduced ejection fraction) ([place]) 05/10/2021   Encephalopathy acute 03/11/2021   Seizures ([place]) 02/24/2021   Aortic atherosclerosis ([place]) 11/22/2020   History of seizures 11/22/2020   Alcohol use disorder, severe, dependence ([place]) 11/22/2020   Type 2 diabetes mellitus ([place]) 11/22/2020   Anemia 11/22/2020   Hyperlipidemia 11/22/2020   Acute encephalopathy 10/16/2020   Essential hypertension 10/08/2019   History of CVA (cerebrovascular accident) 10/02/2019    Conditions to be addressed/monitored per PCP order:  CAD, HTN, DMII, and Seizures  Care Plan : RN Care Manager Plan of Care  Updates made by [name], RN since 10/31/2021 12:00 AM     Problem: Chronic Disease Management and Care Coordination Needs for DMII, HTN, CAD, Seizures   Priority: High     Long-Range Goal: Development of Plan of Care for Chronic Disease Management and Care Coordination Needs (CAD, HTN, DMII, Seizures)   Start Date: 08/08/2021  Expected End Date: 12/06/2021  Priority: High  Note:   Current Barriers:  Knowledge Deficits related to plan of care for management of CAD, HTN, DMII, and Seizures  Care Coordination needs related to Financial constraints related to affordable safe housing and utility payment, Housing barriers, Medication procurement, ADL IADL limitations, Literacy concerns, Cognitive Deficits, Memory Deficits, Inability to perform IADL's independently, and Lacks knowledge of community resource: safe & affordable Section 8 Housing, utility payment assistance and dental care providers covered by [name].  Chronic Disease Management support and education needs related to CAD, HTN, DMII, and Seizures Financial Constraints.  Non-adherence to prescribed medication regimen Difficulty obtaining  medications Cognitive Deficits No Advanced Directives in place Falls - Increased Potential for Falls  RNCM Clinical Goal(s):  Patient will verbalize understanding of plan for management of CAD, HTN, DMII, and Seizures as evidenced by improved management of these chronic diseases. verbalize basic understanding of CAD, HTN, DMII, and Seizures disease process and self health management plan as evidenced by noted improvement of management of these chronic diseases. take all medications exactly as prescribed and will call provider for medication related questions as evidenced by being compliant with all medications    attend all scheduled medical appointments: 11/01/2021 Echocardiogram; 11/16/21 [facility] BSW; 11/27/2021 Cardiologist; 11/30/2021 [facility] Pharmacist; 01/03/2022 Neurologist as evidenced by attending all scheduled appointments        demonstrate improved adherence to prescribed treatment plan for CAD, HTN, DMII, and Seizures as evidenced by overall improved management of these chronic diseases continue to work with Consulting civil engineer and/or Social Worker to address care management and care coordination needs related to CAD, HTN, DMII, and Seizures as evidenced by adherence to CM Team Scheduled appointments     work with pharmacist to address Financial constraints related to obtaining medications and Medication procurement related to CAD, HTN, DMII, and Seizures as evidenced by review  of EMR and patient or pharmacist report    work with Education officer, museum to address Financial constraints related to safe affordable Housing and utility payment , Housing barriers, ADL IADL limitations, Cognitive Deficits, Memory Deficits, Inability to perform IADL's independently, and Lacks knowledge of community resource: safe and affordable Section 8 Housing, utility payment assistance and dental care providers covered by [employer] insurance related to the management of CAD, HTN, DMII, and Seizures as evidenced by review of EMR and  patient or Education officer, museum report     through collaboration with Consulting civil engineer, provider, and care team.   Interventions: Inter-disciplinary care team collaboration (see longitudinal plan of care) Evaluation of current treatment plan related to  self management and patient's adherence to plan as established by provider BSW completed telephone outreach with patient and his wife. They stated they are currently in a hotel but are waiting on paperwork and inspections to be completed on the new house. They will be in the hotel until 08/24/21, but do not know when the new house will be ready to move in. Patient does not have any income right. They are needing assistance with paying the last utility bill from their previous home. BSW informed they can go to DSS and apply for the utility program, however since the services are disconnected due to moving, they may not assist. They provided an email address for the dental resources to be emailed to [email or web address]. No other resources are needed at this time.  09/05/21: BSW completed follow up with patient's wife while patient was at an appointment. She stated they are still in the hotel and patient still does not have any income. They have applied for disability and are waiting for an appeal letter. [name] states they have been in contact with the housing caseworker and she did state that it can take some time.  10/05/21: BSW completed telephone outreach with patient and wife. She stated they moved into their new home on 09/26/21 and they are still unpacking. She states patient is doing really good. She did ask for some resources that will help with household items, BSW will put a list together and send to patient. 10/03/2021:  [name] informed this RN Care Manager that patient's disability has been denied.  Patient and wife recently moved from a hotel to a new home in [place].  New Address updated in patient's medical record.   CAD  (Status: Goal  on Track (progressing): YES.) Long Term Goal  Assessed understanding of CAD diagnosis Medications reviewed including medications utilized in CAD treatment plan Provided education on importance of blood pressure control in management of CAD; Counseled on the importance of exercise goals with target of 150 minutes per week Reviewed Importance of taking all medications as prescribed Reviewed Importance of attending all scheduled provider appointments Assessed social determinant of health barriers;  [name] denies patient having any chest pain, edema or shortness of breath. Wife also reports patient weight is stable at 167 lbs.  Discussed with patient's wife the importance of increased exercise and activity for patient.  Discussed need for a cane to help with ambulation and to participate in ADLs and IADLs safely.  Will send request to PCP for order of a cane to help with ambulation now that patient has moved into permanent housing.   Wife was reluctant to have PCP order DME while they resided in a hotel but now that they have moved into permanent housing she would like DME to be ordered  and delivered to home. Patient currently has all medications per patient's wife.  Patient needs refill on Entresto.  Will send message to cardiologist for refill order.  Diabetes:  (Status: Goal on Track (progressing): YES.) Long Term Goal  Lab Results  Component Value Date   HGBA1C 7.1 (H) 09/05/2021    Assessed patient's understanding of A1c goal: <7% Provided education to patient about basic DM disease process; Reviewed medications with patient and discussed importance of medication adherence;        Discussed plans with patient for ongoing care management follow up and provided patient with direct contact information for care management team;      Reviewed scheduled/upcoming provider appointments including: see above in [place];         Referral made to pharmacy team for assistance with  complex medication regimen; [facility] Pharmacist involved in care;       Referral made to social work team for assistance with housing and utility payment assistance, dental care providers covered by Managed Medicaid; [facility] BSW involved in care;      Assessed social determinant of health barriers;        Patient has moved to a new home with wife recently.  Discussed need for a glucometer now that patient has moved to new residence form hotel.  Plan to collaborate with PCP in obtaining a glucometer for patient.  Patient currently has all medication per patient's wife.  [name] denies any episodes or any signs/symptoms of Hypoglycemia or Hyperglycemia.   Seizures  (Status: Goal on Track (progressing): YES.) Long Term Goal  Evaluation of current treatment plan related to  Seizures ,  and any seizure activity,  self-management and patient's adherence to plan as established by provider. Discussed plans with patient for ongoing care management follow up and provided patient with direct contact information for care management team Advised patient to report any seizure activity to PCP; Reviewed medications with patient and discussed importance of medication compliance; Reviewed scheduled/upcoming provider appointments including : see above in [place]; Social Work referral for [employer], Scientist, product/process development and dental care providers; Pharmacy referral for complex medication regimen; Assessed social determinant of health barriers;  [name] reports no recent seizures.  Patient currently has all medications per patient's wife.  Hypertension: (Status: Goal on Track (progressing): YES.) Long Term Goal  Last practice recorded BP readings:  BP Readings from Last 3 Encounters:  09/05/21 128/78  08/28/21 132/78  07/11/21 128/74      Most recent eGFR/CrCl:  Lab Results  Component Value Date   EGFR 92 06/06/2021    No components found for: CRCL  Evaluation of current treatment plan related to  hypertension self management and patient's adherence to plan as established by provider;   Reviewed prescribed diet Low Salt, Heart Healthy Reviewed medications with patient and discussed importance of compliance;  Provided assistance with obtaining home blood pressure monitor via contacting  [facility] to follow up on Blood Pressure monitor ordered last month.  [facility] staff confirmed that blood pressure monitor is ready.  I requested they deliver the monitor to patient's new home.  [facility] will deliver the monitor.;  Counseled on the importance of exercise goals with target of 150 minutes per week Discussed plans with patient for ongoing care management follow up and provided patient with direct contact information for care management team; Reviewed scheduled/upcoming provider appointments including:  Assessed social determinant of health barriers;   Patient Goals/Self-Care Activities: Take medications as  prescribed   Attend all scheduled provider appointments Call pharmacy for medication refills 3-7 days in advance of running out of medications Call provider office for new concerns or questions  Work with the social worker to address care coordination needs and will continue to work with the clinical team to address health care and disease management related needs Work to obtain a blood pressure monitor through [employer].  (See Note above under HTN) Work to obtain a glucometer for Blood sugar monitoring. Work to obtain a cane for assistance with ambulation.       Follow Up:  Patient agrees to Care Plan and Follow-up.  Plan: The Managed Medicaid care management team will reach out to the patient again over the next 30 days.  Date/time of next scheduled RN care management/care coordination outreach:  December 05, 2021 at 1:00 PM with Terri Craft RN  [name] RN, BSN [facility] Coordinator [place] [facility] Mobile: [phone]

## 2021-10-31 NOTE — Patient Instructions (Signed)
Visit Information  Arthur [name] spouse, [name], was given information about Medicaid Managed Care team care coordination services as a part of their [place] Medicaid benefit. Arthur [name] 's spouse, [name], verbally consented to engagement with the [facility] Managed Care team.   If you are experiencing a medical emergency, please call 911 or report to your local emergency department or urgent care.   If you have a non-emergency medical problem during routine business hours, please contact your provider's office and ask to speak with a nurse.   For questions related to your [facility], please call: [phone] or visit the homepage here: [email or web address]  If you would like to schedule transportation through your [facility], please call the following number at least 2 days in advance of your appointment: [phone].  Rides for urgent appointments can also be made after hours by calling Member Services.  Call the [place] at [phone], at any time, 24 hours a day, 7 days a week. If you are in danger or need immediate medical attention call 911.  If you would like help to quit smoking, call 1-800-QUIT-NOW 4380568627) OR Espaol: 1-855-Djelo-Ya (0-370-488-8916) o para ms informacin haga clic aqu or Text READY to 200-400 to register via text  Arthur [name] - following are the goals we discussed in your visit today:  Please see Patient's Goals in the [place] of Care below.   Please see education materials related to today's visit provided as print materials.   The patient verbalized understanding of instructions, educational materials, and care plan provided today and agreed to receive a mailed copy of patient instructions, educational materials, and care plan.   The Managed Medicaid care management team will  reach out to the patient again over the next 30 days.   Arthur Marvel RN, BSN Community Care Coordinator [facility] Mobile: [phone]   Following is a copy of your plan of care:  Care Plan : [name]  Updates made by [name], RN since 10/31/2021 12:00 AM     Problem: Chronic Disease Management and Care Coordination Needs for DMII, HTN, CAD, Seizures   Priority: High     Long-Range Goal: Development of Plan of Care for Chronic Disease Management and Care Coordination Needs (CAD, HTN, DMII, Seizures)   Start Date: 08/08/2021  Expected End Date: 12/06/2021  Priority: High  Note:   Current Barriers:  Knowledge Deficits related to plan of care for management of CAD, HTN, DMII, and Seizures  Care Coordination needs related to Financial constraints related to affordable safe housing and utility payment, Housing barriers, Medication procurement, ADL IADL limitations, Literacy concerns, Cognitive Deficits, Memory Deficits, Inability to perform IADL's independently, and Lacks knowledge of community resource: safe & affordable Section 8 Housing, utility payment assistance and dental care providers covered by [name].  Chronic Disease Management support and education needs related to CAD, HTN, DMII, and Seizures Financial Constraints.  Non-adherence to prescribed medication regimen Difficulty obtaining medications Cognitive Deficits No Advanced Directives in place Falls - Increased Potential for Falls  RNCM Clinical Goal(s):  Patient will verbalize understanding of plan for management of CAD, HTN, DMII, and Seizures as evidenced by improved management of these chronic diseases. verbalize basic understanding of CAD, HTN, DMII, and Seizures disease process and self health management plan as evidenced by noted improvement of management of these chronic diseases. take  all medications exactly as prescribed and will  call provider for medication related questions as evidenced by being compliant with all medications    attend all scheduled medical appointments: 11/01/2021 Echocardiogram; 11/16/21 [facility] BSW; 11/27/2021 Cardiologist; 11/30/2021 [facility] Pharmacist; 01/03/2022 Neurologist as evidenced by attending all scheduled appointments        demonstrate improved adherence to prescribed treatment plan for CAD, HTN, DMII, and Seizures as evidenced by overall improved management of these chronic diseases continue to work with Consulting civil engineer and/or Social Worker to address care management and care coordination needs related to CAD, HTN, DMII, and Seizures as evidenced by adherence to CM Team Scheduled appointments     work with pharmacist to address Financial constraints related to obtaining medications and Medication procurement related to CAD, HTN, DMII, and Seizures as evidenced by review of EMR and patient or pharmacist report    work with Education officer, museum to address Financial constraints related to safe affordable Housing and utility payment , Housing barriers, ADL IADL limitations, Cognitive Deficits, Memory Deficits, Inability to perform IADL's independently, and Lacks knowledge of community resource: safe and affordable Section 8 Housing, utility payment assistance and dental care providers covered by [employer] insurance related to the management of CAD, HTN, DMII, and Seizures as evidenced by review of EMR and patient or Education officer, museum report     through collaboration with Consulting civil engineer, provider, and care team.   Interventions: Inter-disciplinary care team collaboration (see longitudinal plan of care) Evaluation of current treatment plan related to  self management and patient's adherence to plan as established by provider BSW completed telephone outreach with patient and his wife. They stated they are currently in a hotel but are waiting on paperwork and inspections to be completed on the new house. They will be in the  hotel until 08/24/21, but do not know when the new house will be ready to move in. Patient does not have any income right. They are needing assistance with paying the last utility bill from their previous home. BSW informed they can go to DSS and apply for the utility program, however since the services are disconnected due to moving, they may not assist. They provided an email address for the dental resources to be emailed to [email or web address]. No other resources are needed at this time.  09/05/21: BSW completed follow up with patient's wife while patient was at an appointment. She stated they are still in the hotel and patient still does not have any income. They have applied for disability and are waiting for an appeal letter. [name] states they have been in contact with the housing caseworker and she did state that it can take some time.  10/05/21: BSW completed telephone outreach with patient and wife. She stated they moved into their new home on 09/26/21 and they are still unpacking. She states patient is doing really good. She did ask for some resources that will help with household items, BSW will put a list together and send to patient. 10/03/2021:  [name] informed this RN Care Manager that patient's disability has been denied.  Patient and wife recently moved from a hotel to a new home in [place].  New Address updated in patient's medical record.   CAD  (Status: Goal on Track (progressing): YES.) Long Term Goal  Assessed understanding of CAD diagnosis Medications reviewed including medications utilized in CAD treatment plan Provided education on importance of blood pressure control in management of CAD; Counseled on the importance of exercise  goals with target of 150 minutes per week Reviewed Importance of taking all medications as prescribed Reviewed Importance of attending all scheduled provider appointments Assessed social determinant of health barriers;  [name]  denies patient having any chest pain, edema or shortness of breath. Wife also reports patient weight is stable at 167 lbs.  Discussed with patient's wife the importance of increased exercise and activity for patient.  Discussed need for a cane to help with ambulation and to participate in ADLs and IADLs safely.  Will send request to PCP for order of a cane to help with ambulation now that patient has moved into permanent housing.   Wife was reluctant to have PCP order DME while they resided in a hotel but now that they have moved into permanent housing she would like DME to be ordered and delivered to home. Patient currently has all medications per patient's wife.  Patient needs refill on Entresto.  Will send message to cardiologist for refill order.  Diabetes:  (Status: Goal on Track (progressing): YES.) Long Term Goal  Lab Results  Component Value Date   HGBA1C 7.1 (H) 09/05/2021    Assessed patient's understanding of A1c goal: <7% Provided education to patient about basic DM disease process; Reviewed medications with patient and discussed importance of medication adherence;        Discussed plans with patient for ongoing care management follow up and provided patient with direct contact information for care management team;      Reviewed scheduled/upcoming provider appointments including: see above in [place];         Referral made to pharmacy team for assistance with complex medication regimen; [facility] Pharmacist involved in care;       Referral made to social work team for assistance with housing and utility payment assistance, dental care providers covered by Managed Medicaid; [facility] BSW involved in care;      Assessed social determinant of health barriers;        Patient has moved to a new home with wife recently.  Discussed need for a glucometer now that patient has moved to new residence form hotel.  Plan to collaborate with PCP in obtaining a glucometer for patient.  Patient currently  has all medication per patient's wife.  [name] denies any episodes or any signs/symptoms of Hypoglycemia or Hyperglycemia.   Seizures  (Status: Goal on Track (progressing): YES.) Long Term Goal  Evaluation of current treatment plan related to  Seizures ,  and any seizure activity,  self-management and patient's adherence to plan as established by provider. Discussed plans with patient for ongoing care management follow up and provided patient with direct contact information for care management team Advised patient to report any seizure activity to PCP; Reviewed medications with patient and discussed importance of medication compliance; Reviewed scheduled/upcoming provider appointments including : see above in [place]; Social Work referral for [employer], Scientist, product/process development and dental care providers; Pharmacy referral for complex medication regimen; Assessed social determinant of health barriers;  [name] reports no recent seizures.  Patient currently has all medications per patient's wife.  Hypertension: (Status: Goal on Track (progressing): YES.) Long Term Goal  Last practice recorded BP readings:  BP Readings from Last 3 Encounters:  09/05/21 128/78  08/28/21 132/78  07/11/21 128/74      Most recent eGFR/CrCl:  Lab Results  Component Value Date   EGFR 92 06/06/2021    No components found for: CRCL  Evaluation of current treatment plan  related to hypertension self management and patient's adherence to plan as established by provider;   Reviewed prescribed diet Low Salt, Heart Healthy Reviewed medications with patient and discussed importance of compliance;  Provided assistance with obtaining home blood pressure monitor via contacting  [facility] to follow up on Blood Pressure monitor ordered last month.  [facility] staff confirmed that blood pressure monitor is ready.  I requested they deliver the monitor to patient's new home.  [facility] will  deliver the monitor.;  Counseled on the importance of exercise goals with target of 150 minutes per week Discussed plans with patient for ongoing care management follow up and provided patient with direct contact information for care management team; Reviewed scheduled/upcoming provider appointments including:  Assessed social determinant of health barriers;   Patient Goals/Self-Care Activities: Take medications as prescribed   Attend all scheduled provider appointments Call pharmacy for medication refills 3-7 days in advance of running out of medications Call provider office for new concerns or questions  Work with the social worker to address care coordination needs and will continue to work with the clinical team to address health care and disease management related needs Work to obtain a blood pressure monitor through [employer].  (See Note above under HTN) Work to obtain a glucometer for Blood sugar monitoring. Work to obtain a cane for assistance with ambulation.

## 2021-11-01 ENCOUNTER — Other Ambulatory Visit: Payer: Medicaid Other

## 2021-11-02 ENCOUNTER — Other Ambulatory Visit: Payer: Self-pay | Admitting: Student

## 2021-11-02 ENCOUNTER — Other Ambulatory Visit: Payer: Self-pay

## 2021-11-02 MED ORDER — ENTRESTO 49-51 MG PO TABS
1.0000 | ORAL_TABLET | Freq: Two times a day (BID) | ORAL | 3 refills | Status: DC
  Filled 2021-11-02: qty 180, 90d supply, fill #0

## 2021-11-03 ENCOUNTER — Ambulatory Visit: Payer: Medicaid Other

## 2021-11-03 DIAGNOSIS — R4701 Aphasia: Secondary | ICD-10-CM

## 2021-11-03 DIAGNOSIS — R41841 Cognitive communication deficit: Secondary | ICD-10-CM

## 2021-11-03 NOTE — Patient Instructions (Signed)
Please complete and return homework to next session.   Name 10 things you would find at a cookout         Cueing Hierarchy: Description - define the word or provide the function First letter - provide the first letter of the target word Spell - spell the target word, orally Phrase Completion - give a phrase to complete First sound - provide the first sound of the target word Model - say the word aloud to repeat

## 2021-11-03 NOTE — Therapy (Signed)
OUTPATIENT SPEECH LANGUAGE PATHOLOGY TREATMENT NOTE   Patient Name: Arthur [name] MRN: [id number] DOB:03-09-1961, 61 y.o., male Today's Date: 11/03/2021  PCP: [name], MD REFERRING PROVIDER: Angelica Pou, MD   END OF SESSION:   End of Session - 11/03/21 0932     Visit Number 8    Number of Visits 17    Date for SLP Re-Evaluation 12/07/21    Authorization Type [employer] Medicaid (no auth; 27 visits max)    SLP Start Time [phone]    SLP Stop Time  1016    SLP Time Calculation (min) 45 min    Activity Tolerance Patient tolerated treatment well               Past Medical History:  Diagnosis Date   DM2 (diabetes mellitus, type 2) ([place])    ETOH abuse    Hypertension    Seizures ([place])    Transaminitis    Past Surgical History:  Procedure Laterality Date   HEMORRHOID SURGERY     INTRAVASCULAR PRESSURE WIRE/FFR STUDY N/A 06/27/2021   Procedure: INTRAVASCULAR PRESSURE WIRE/FFR STUDY;  Surgeon: Nigel Mormon, MD;  Location: [facility];  Service: Cardiovascular;  Laterality: N/A;   LEFT HEART CATH AND CORONARY ANGIOGRAPHY N/A 06/27/2021   Procedure: LEFT HEART CATH AND CORONARY ANGIOGRAPHY;  Surgeon: Nigel Mormon, MD;  Location: [facility] [facility];  Service: Cardiovascular;  Laterality: N/A;   Patient Active Problem List   Diagnosis Date Noted   Vitamin D deficiency 09/07/2021   Cognitive impairment 09/06/2021   Coronary artery disease    HFrEF (heart failure with reduced ejection fraction) ([place]) 05/10/2021   Encephalopathy acute 03/11/2021   Seizures ([place]) 02/24/2021   Aortic atherosclerosis ([place]) 11/22/2020   History of seizures 11/22/2020   Alcohol use disorder, severe, dependence ([place]) 11/22/2020   Type 2 diabetes mellitus ([place]) 11/22/2020   Anemia 11/22/2020   Hyperlipidemia 11/22/2020   Acute encephalopathy 10/16/2020   Essential hypertension 10/08/2019   History of CVA (cerebrovascular accident) 10/02/2019    ONSET DATE: February 24, 2021    REFERRING DIAG: R56.9 (ICD-10-CM) - Seizures ([place]) Z86.73 (ICD-10-CM) - History of CVA (cerebrovascular accident)  THERAPY DIAG:  Aphasia  Cognitive communication deficit  SUBJECTIVE: "pretty good pretty good"  PAIN:  Are you having pain? No OBJECTIVE:  TODAY'S TREATMENT:  11-03-21: Pt arrived alone. Intermittent clarification of questions provided due to reduced auditory comprehension. SLP provided occasional min A to correct articulation errors in opening conversation with most benefit of phonemic cues. SLP cued use of description strategy x2 to aid anomia, in which pt able to ID single descriptive word to aid listener understanding. Did not return with HEP with "a little" completion endorsed. SLP reiterated importance of completing and returning HEP to SLP to demonstrate home practice, to further assess pt performance, and to determine next therapeutic tasks. SLP targeted convergent naming given verbal description, with usual fading to occasional repetition required to aid comprehension. Pt able to name targeted word with 30% accuracy independently with self-corrections of apraxic errors x3. Given mod to max A via phonemic, semantic, and first letter cues, accuracy improved to 80%.   10-27-21: Functional verbal expression noted with improvements today, given less frequent articulation/apraxic errors and anomia. Pt able to occasionally self-correct at word level. Benefited from phonemic cues, visual cues, and occasionally from semantic cues to aid word naming and articulation. Pt independently used gesture x1. SLP conducted SFA training today to target use of description  strategy for anomia and functional naming. Pt required intermittent multiple choice to ID group, semantic cues for descriptions, and usual prompting to ID personal associations. Occasional min to mod A required to aid reading at word level. SLP provided SFA for HEP and provided communication recommendations for caregivers  (handout provided).   10-19-21: Targeted word retrieval through [place]. Given category of "sports" pt able to name x5 IND, x2 additional given semantic cues. Using picture stimuli, able to name 10/12 using usual mod-A. Given category of "football," pt generated x14 related items including verbs, nouns, and team names with usual mod to max-A. Intermittent success of semantic and phonemic cueing. Usual use of providing model required for pt to write target words, limited focused and selective attention evidenced by frequently looking out window and missing letters when copying. Target motor speech production re: apraxia using direct model and mass practice for generated targets. ST provides usual model then fades to pt using in response to ?[name] Given above mentioned level of support, able to accurately produce 10/11 target words which pt initial struggled to produce.   10-16-21: Pt endorsed the "tray" as rationale for earlier missed appointment. SLP deduced traffic as pt unable to self-correct or describe with any more detail due to perseverations. Pt able to repeat successfully with mod A. SLP trialed VNeST today with max A required for [facility]- questions with SLP providing usual examples to aid comprehension. Will continue to target VNeST in ST sessions prior to adding to HEP. SLP targeted word associations with usual mod to max A required to aid naming and correction of articulation errors. Visual cues and syllable segmentation were most beneficial for articulatory accuracy.     PATIENT EDUCATION: Education details: see above Person educated: Patient  Education method: Explanation, Demonstration, and Handouts Education comprehension: verbalized understanding, returned demonstration, and needs further education         GOALS: Goals reviewed with patient? Yes   SHORT TERM GOALS: Target date: 10/13/2021   Pt will complete standardized assessment for cognition, language and PROM within  first two therapy sessions Baseline: initiated CLQT (deferred CLQT due to aphasia; CPIB=8) Goal status: Partially Met   2.  Pt will complete structured language tasks (e.g. VNeST, SFA, RET) with 80% accuracy, with mod-A over 2 sessions Baseline: language score on CLQT (15) indicates significant impairment; 10-12-21 Goal status: NOT MET   3.  Pt's spouse/caregiver/care partner will provide appropriate cues to assist pt with verbal output given occasional min A over 2 sessions Baseline: caregiver has not received training on types of cues or cueing hierarchy; 10-12-21 Goal status: NOT MET   4.  Pt will verbalize and implement 2 attention and memory compensations to aid daily functioning given occasional min A over 2 sessions Baseline: none being used; 09-29-21, 10-12-21 Goal status: PARTIALLY MET     LONG TERM GOALS: Target date: 12/08/2021   Pt will report subjective improvement in communication abilities via PROM by d/c  Baseline: PROM to be administered first therapy session; CPIB: 8 Goal status: ongoing   2.  Pt and spouse will report compliance with HEP targeting expressive language with mod-I over 1 week period  Baseline: HEP to be established Goal status: ongoing   3.  Pt and spouse will report improved participation in x2 preferred activities as home with use of trained compensations over 1 week period Baseline: report pt is not participating in preferred activities or household chores, limited to sitting on porch and watching TV Goal status:  ongoing   4.  Pt will carryover 2 stratregies for memory to recall conversations and pertinent events with family over 1 week Baseline:  Goal status: ongoing     ASSESSMENT:   CLINICAL IMPRESSION: Patient is a 61 y.o. M who was seen today for cognitive communication deficits and aphasia s/p CVA + seizure. Conducted ongoing education and instruction for speech and language strategies/tasks to optimize functional communication. Phonemic,  semantic, and visual cues were effective to aid naming and articulation. Min to max A was beneficial for syllable segmentation to aid articulatory precision at word level. I recommend skilled ST to address expressive language and cognitive deficits.    OBJECTIVE IMPAIRMENTS include attention, memory, awareness, executive functioning, expressive language, and aphasia. These impairments are limiting patient from return to work, managing medications, managing appointments, managing finances, household responsibilities, ADLs/IADLs, and effectively communicating at home and in community. Factors affecting potential to achieve goals and functional outcome are ability to learn/carryover information and severity of impairments.. Patient will benefit from skilled SLP services to address above impairments and improve overall function.   REHAB POTENTIAL: Fair dependent on participation in [place], frequency at which pt can attend therapy   PLAN: SLP FREQUENCY: 2x/week   SLP DURATION: 12 weeks   PLANNED INTERVENTIONS: Language facilitation, Environmental controls, Cueing hierachy, Cognitive reorganization, Internal/external aids, Functional tasks, Multimodal communication approach, SLP instruction and feedback, Compensatory strategies, and Patient/family education  Marzetta Board, [place] 11/03/2021, 9:32 AM

## 2021-11-06 ENCOUNTER — Ambulatory Visit: Payer: Medicaid Other

## 2021-11-06 ENCOUNTER — Telehealth: Payer: Self-pay | Admitting: *Deleted

## 2021-11-06 NOTE — Telephone Encounter (Signed)
PCS form for change of status was faxed Friday evening a the request of case manager. Re faxed again today.  [facility] (Ms. Lawerance Cruel [phone] Valinda Hoar [phone]) waiting for confirmation sheet.

## 2021-11-08 ENCOUNTER — Encounter: Payer: Medicaid Other | Admitting: Speech Pathology

## 2021-11-08 IMAGING — CT CT HEAD W/O CM
4 series · 14 of 47 positions shown, 16 images · non-contrast
Comparison: None.

CLINICAL DATA: Evaluate for intracranial hemorrhage after
administration of tPA in a patient with history of stroke.

EXAM:
CT HEAD WITHOUT CONTRAST
TECHNIQUE: Contiguous axial images were obtained from the base of the skull
through the vertex without intravenous contrast.

[Series 3: head without ax · axial · non-contrast · 0.34mm/px · z∈[-148,-38]mm · 6 of 32 slices shown, 8 images]
[im 5/32  brain]
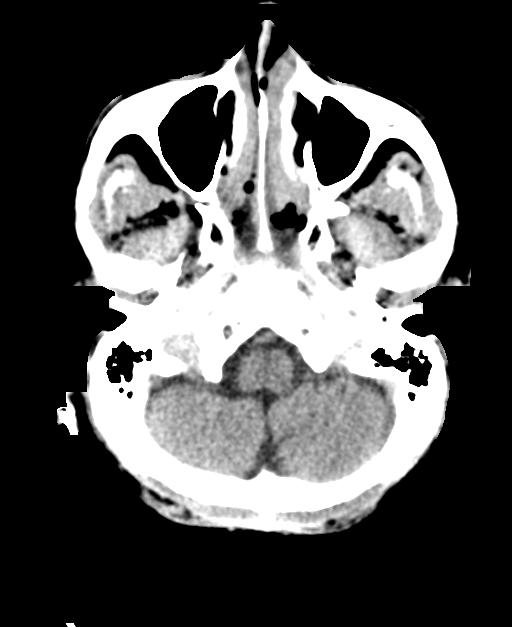
[im 5/32  bone]
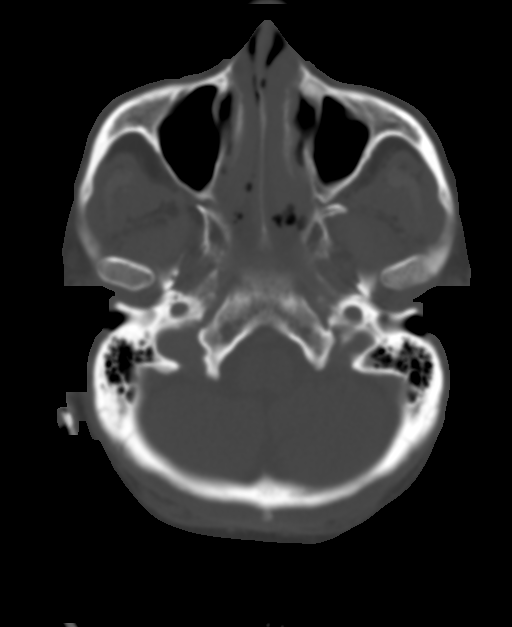
[im 9/32  brain]
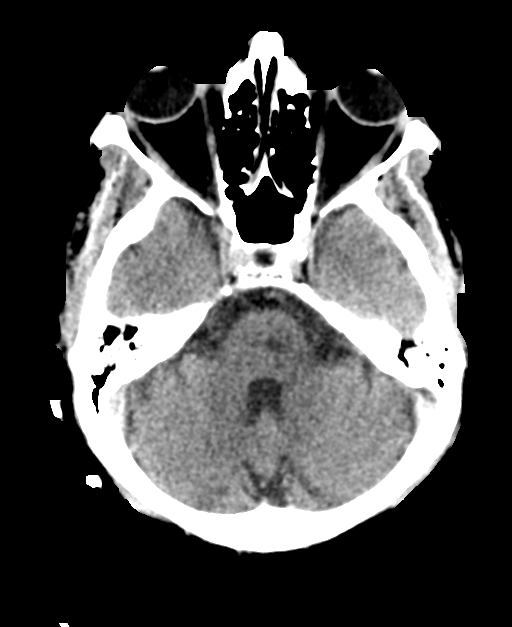
[im 14/32  brain]
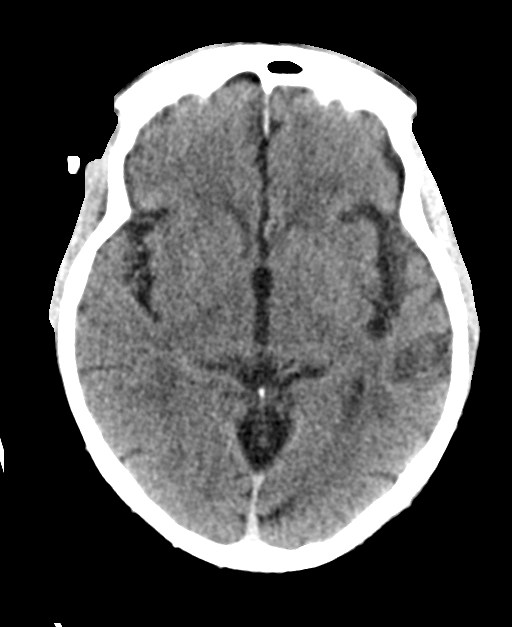
[im 18/32  brain]
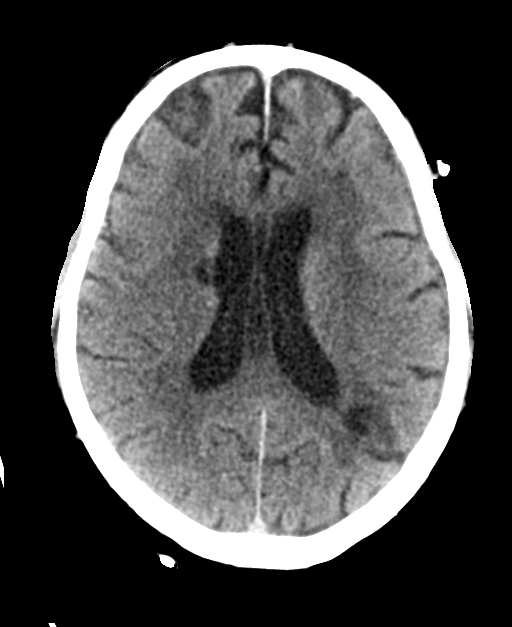
[im 23/32  brain]
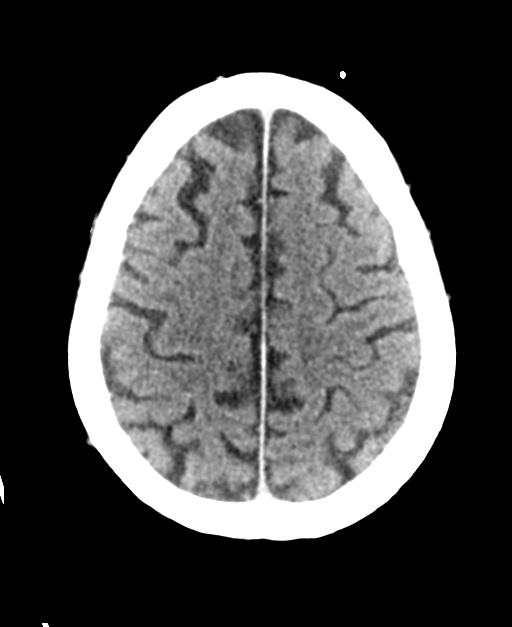
[im 23/32  bone]
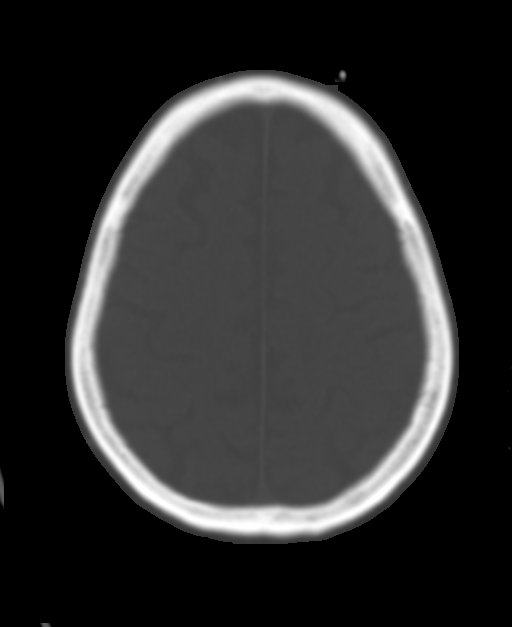
[im 27/32  brain]
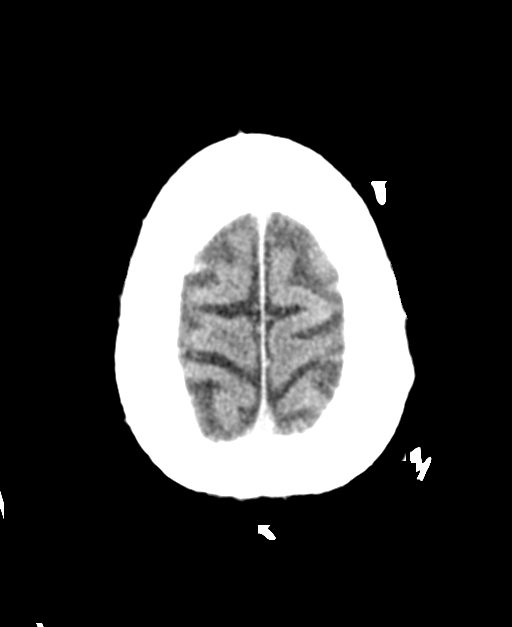

[Series 4: head bone · axial · 0.42mm/px · z∈[-158,-142]mm · 2 of 83 slices shown]
[im 8/83  bone]
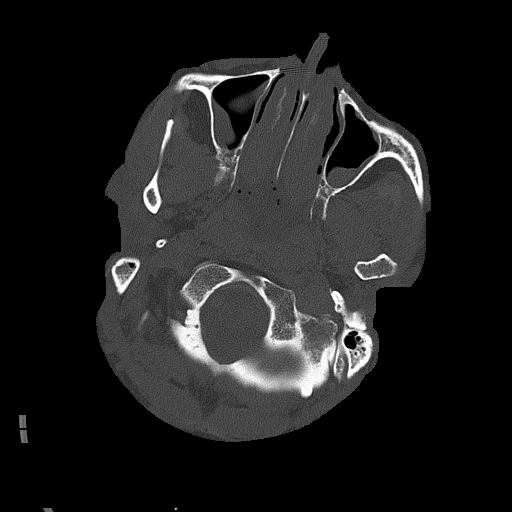
[im 16/83  bone]
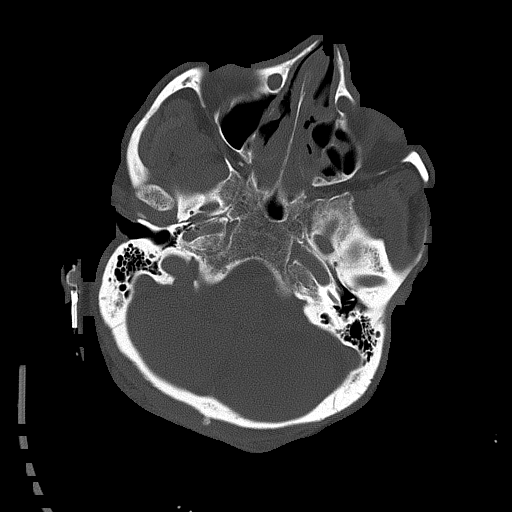

[Series 5: head without cor · coronal · non-contrast · 0.32mm/px · 3 of 67 slices shown]
[im 23/67  brain]
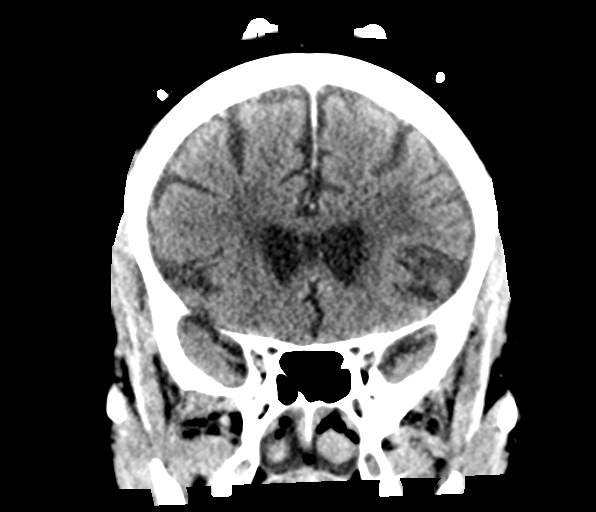
[im 30/67  brain]
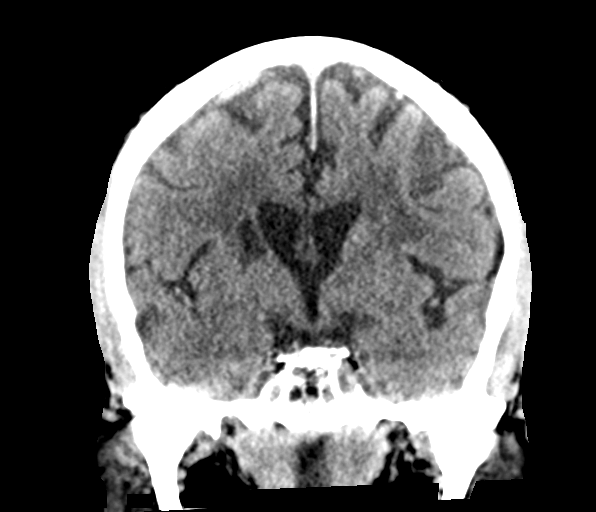
[im 37/67  brain]
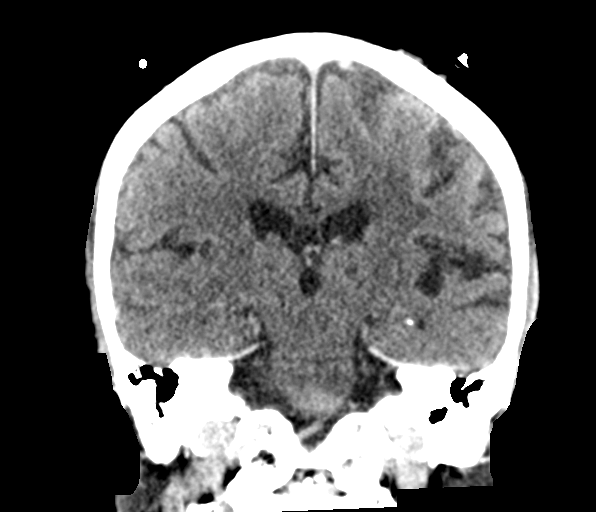

[Series 6: head without sag · sagittal · non-contrast · 0.32mm/px · 3 of 62 slices shown]
[im 21/62  brain]
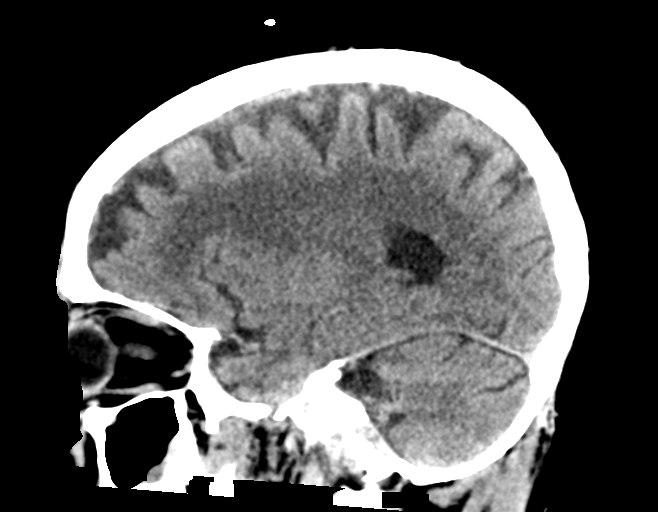
[im 31/62  brain]
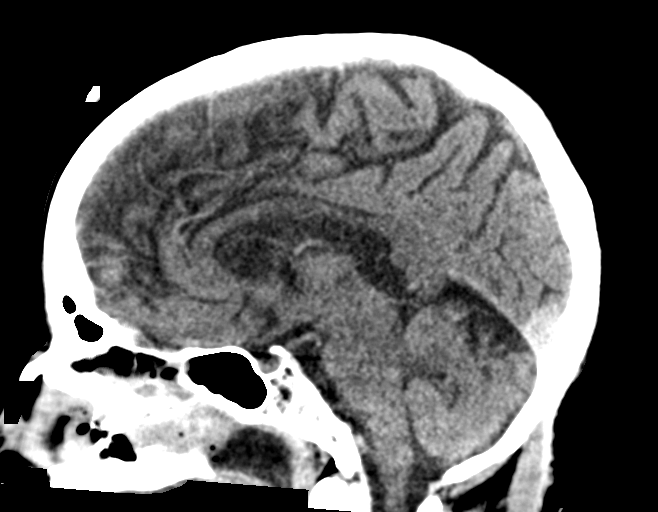
[im 41/62  brain]
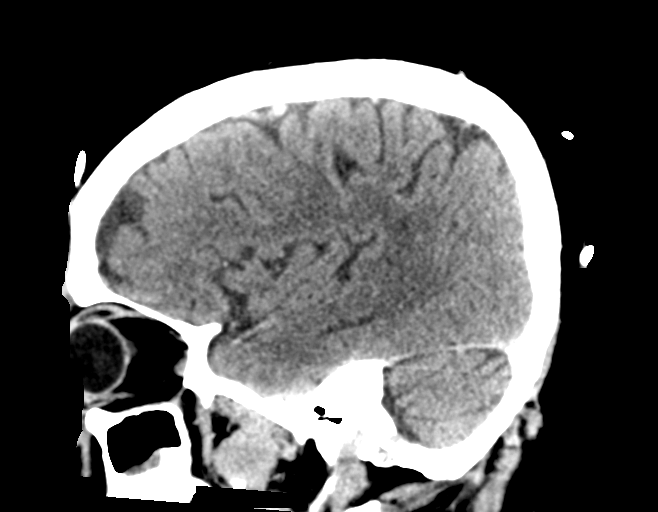

[14 of 47 positions shown; findings below may reference images not displayed]

FINDINGS: Brain: No evidence of acute infarction, hemorrhage, hydrocephalus,
extra-axial collection or mass lesion/mass effect. Signs of prior
infarct in the LEFT occipital region and RIGHT corona radiata,
atrophy and chronic microvascular ischemic change without change.

Vascular: No hyperdense vessel or unexpected calcification.

Skull: Normal. Negative for fracture or focal lesion.

Sinuses/Orbits: Sinus disease as on the recent comparison study.

Other: None.
IMPRESSION: 1. No acute intracranial abnormality.
2. Signs of prior infarct in the LEFT occipital region and RIGHT
basal ganglia, atrophy and chronic microvascular ischemic change
without change.
3. Sinus disease as on the recent comparison study.

## 2021-11-10 ENCOUNTER — Ambulatory Visit: Payer: Medicaid Other | Admitting: Speech Pathology

## 2021-11-10 NOTE — Therapy (Deleted)
ORAL MOTOR EXAMINATION Impaired Labial: {omeimpairment:27637} {OMErlb:27638}  Lingual {omeimpairment:27637} {OMErlb:27638} Facial {omeimpairment:27637} {OMErlb:27638} Velum {omeimpairment:27637} {OMErlb:27638} Mandible {omeimpairment:27637} {OMErlb:27638} Cough {omeimpairment:27637} {OMErlb:27638} {omeimpairment:27637}{OMErlb:27638}

## 2021-11-13 DIAGNOSIS — R4189 Other symptoms and signs involving cognitive functions and awareness: Secondary | ICD-10-CM | POA: Diagnosis not present

## 2021-11-14 ENCOUNTER — Ambulatory Visit: Payer: Medicaid Other | Admitting: Speech Pathology

## 2021-11-14 DIAGNOSIS — R4701 Aphasia: Secondary | ICD-10-CM | POA: Diagnosis not present

## 2021-11-14 DIAGNOSIS — R41841 Cognitive communication deficit: Secondary | ICD-10-CM

## 2021-11-14 DIAGNOSIS — R4189 Other symptoms and signs involving cognitive functions and awareness: Secondary | ICD-10-CM | POA: Diagnosis not present

## 2021-11-14 NOTE — Therapy (Signed)
OUTPATIENT SPEECH LANGUAGE PATHOLOGY TREATMENT NOTE   Patient Name: Arthur [name] MRN: [id number] DOB:1960/06/15, 61 y.o., male Today's Date: 11/14/2021  PCP: [name], MD REFERRING PROVIDER: Angelica Pou, MD   END OF SESSION:   End of Session - 11/14/21 0921     Visit Number 9    Number of Visits 17    Date for SLP Re-Evaluation 12/07/21    Authorization Type [employer] Medicaid (no auth; 27 visits max)    SLP Start Time [phone]    SLP Stop Time  18    SLP Time Calculation (min) 45 min    Activity Tolerance Patient tolerated treatment well                Past Medical History:  Diagnosis Date   DM2 (diabetes mellitus, type 2) ([place])    ETOH abuse    Hypertension    Seizures ([place])    Transaminitis    Past Surgical History:  Procedure Laterality Date   HEMORRHOID SURGERY     INTRAVASCULAR PRESSURE WIRE/FFR STUDY N/A 06/27/2021   Procedure: INTRAVASCULAR PRESSURE WIRE/FFR STUDY;  Surgeon: Nigel Mormon, MD;  Location: [facility];  Service: Cardiovascular;  Laterality: N/A;   LEFT HEART CATH AND CORONARY ANGIOGRAPHY N/A 06/27/2021   Procedure: LEFT HEART CATH AND CORONARY ANGIOGRAPHY;  Surgeon: Nigel Mormon, MD;  Location: [facility];  Service: Cardiovascular;  Laterality: N/A;   Patient Active Problem List   Diagnosis Date Noted   Vitamin D deficiency 09/07/2021   Cognitive impairment 09/06/2021   Coronary artery disease    HFrEF (heart failure with reduced ejection fraction) ([place]) 05/10/2021   Encephalopathy acute 03/11/2021   Seizures ([place]) 02/24/2021   Aortic atherosclerosis ([place]) 11/22/2020   History of seizures 11/22/2020   Alcohol use disorder, severe, dependence ([place]) 11/22/2020   Type 2 diabetes mellitus ([place]) 11/22/2020   Anemia 11/22/2020   Hyperlipidemia 11/22/2020   Acute encephalopathy 10/16/2020   Essential hypertension 10/08/2019   History of CVA (cerebrovascular accident) 10/02/2019    ONSET DATE: February 24, 2021    REFERRING DIAG: R56.9 (ICD-10-CM) - Seizures ([place]) Z86.73 (ICD-10-CM) - History of CVA (cerebrovascular accident)  THERAPY DIAG:  Aphasia  Cognitive communication deficit  SUBJECTIVE: "pretty good pretty good I'm alright" re: what have you been up to  PAIN:  Are you having pain? No OBJECTIVE:  TODAY'S TREATMENT:  11-14-21: Pt arrives alone, without completed HEP. Does report completion with wife. Wrote pt a note to bring back next session and for wife to call and schedule appointments. Led pt through personally relevant naming task in which pt able to generate list of x5 fishing supplies with rare min-A. Target stregthening of lexical connections through SFA and VNeST with related targets. Able to complete 4/6 prompts for SFA with usual max-A, generate x4 novel sentences using "catch" with usual max-A. Occasional apraxia errors with intermittent communication breakdown. Provide education on strategies for correction and provided handout with cues wife can use with Elta Guadeloupe when perseveration of error'd targets occur at home.   11-03-21: Pt arrived alone. Intermittent clarification of questions provided due to reduced auditory comprehension. SLP provided occasional min A to correct articulation errors in opening conversation with most benefit of phonemic cues. SLP cued use of description strategy x2 to aid anomia, in which pt able to ID single descriptive word to aid listener understanding. Did not return with HEP with "a little" completion endorsed. SLP reiterated importance of completing and returning  HEP to SLP to demonstrate home practice, to further assess pt performance, and to determine next therapeutic tasks. SLP targeted convergent naming given verbal description, with usual fading to occasional repetition required to aid comprehension. Pt able to name targeted word with 30% accuracy independently with self-corrections of apraxic errors x3. Given mod to max A via phonemic, semantic, and  first letter cues, accuracy improved to 80%.     PATIENT EDUCATION: Education details: see above Person educated: Patient  Education method: Explanation, Demonstration, and Handouts Education comprehension: verbalized understanding, returned demonstration, and needs further education         GOALS: Goals reviewed with patient? Yes   SHORT TERM GOALS: Target date: 10/13/2021   Pt will complete standardized assessment for cognition, language and PROM within first two therapy sessions Baseline: initiated CLQT (deferred CLQT due to aphasia; CPIB=8) Goal status: Partially Met   2.  Pt will complete structured language tasks (e.g. VNeST, SFA, RET) with 80% accuracy, with mod-A over 2 sessions Baseline: language score on CLQT (15) indicates significant impairment; 10-12-21 Goal status: NOT MET   3.  Pt's spouse/caregiver/care partner will provide appropriate cues to assist pt with verbal output given occasional min A over 2 sessions Baseline: caregiver has not received training on types of cues or cueing hierarchy; 10-12-21 Goal status: NOT MET   4.  Pt will verbalize and implement 2 attention and memory compensations to aid daily functioning given occasional min A over 2 sessions Baseline: none being used; 09-29-21, 10-12-21 Goal status: PARTIALLY MET     LONG TERM GOALS: Target date: 12/08/2021   Pt will report subjective improvement in communication abilities via PROM by d/c  Baseline: PROM to be administered first therapy session; CPIB: 8 Goal status: ongoing   2.  Pt and spouse will report compliance with HEP targeting expressive language with mod-I over 1 week period  Baseline: HEP to be established Goal status: ongoing   3.  Pt and spouse will report improved participation in x2 preferred activities as home with use of trained compensations over 1 week period Baseline: report pt is not participating in preferred activities or household chores, limited to sitting on porch and  watching TV Goal status: ongoing   4.  Pt will carryover 2 stratregies for memory to recall conversations and pertinent events with family over 1 week Baseline:  Goal status: ongoing     ASSESSMENT:   CLINICAL IMPRESSION: Patient is a 61 y.o. M who was seen today for cognitive communication deficits and aphasia s/p CVA + seizure. Conducted ongoing education and instruction for speech and language strategies/tasks to optimize functional communication. Phonemic, semantic, and visual cues were effective to aid naming and articulation. Min to max A was beneficial for syllable segmentation to aid articulatory precision at word level. I recommend skilled ST to address expressive language and cognitive deficits.    OBJECTIVE IMPAIRMENTS include attention, memory, awareness, executive functioning, expressive language, and aphasia. These impairments are limiting patient from return to work, managing medications, managing appointments, managing finances, household responsibilities, ADLs/IADLs, and effectively communicating at home and in community. Factors affecting potential to achieve goals and functional outcome are ability to learn/carryover information and severity of impairments.. Patient will benefit from skilled SLP services to address above impairments and improve overall function.   REHAB POTENTIAL: Fair dependent on participation in HEP, frequency at which pt can attend therapy   PLAN: SLP FREQUENCY: 2x/week   SLP DURATION: 12 weeks   PLANNED INTERVENTIONS: Language facilitation, Environmental  controls, Cueing hierachy, Cognitive reorganization, Internal/external aids, Functional tasks, Multimodal communication approach, SLP instruction and feedback, Compensatory strategies, and Patient/family education  Su Monks, CCC-SLP 11/14/2021, 9:21 AM

## 2021-11-14 NOTE — Patient Instructions (Addendum)
Please bring back your home practice paperwork with you next visit.   Please call [phone] to schedule more ST appointments.   My address is:    My son's name is:    I call him ny G.   My wife's name is Adrion Menz.    My favorite foods are: [employer] Mindi Slicker

## 2021-11-15 DIAGNOSIS — R4189 Other symptoms and signs involving cognitive functions and awareness: Secondary | ICD-10-CM | POA: Diagnosis not present

## 2021-11-16 ENCOUNTER — Other Ambulatory Visit: Payer: Self-pay

## 2021-11-16 NOTE — Patient Instructions (Signed)
Visit Information  Arthur [name] was given information about Medicaid Managed Care team care coordination services as a part of their [place] Medicaid benefit. Arthur [name] verbally consented to engagement with the [facility] Managed Care team.   If you are experiencing a medical emergency, please call 911 or report to your local emergency department or urgent care.   If you have a non-emergency medical problem during routine business hours, please contact your provider's office and ask to speak with a nurse.   For questions related to your [facility], please call: [phone] or visit the homepage here: [email or web address]  If you would like to schedule transportation through your [facility], please call the following number at least 2 days in advance of your appointment: [phone].  Rides for urgent appointments can also be made after hours by calling Member Services.  Call the [facility] at [phone], at any time, 24 hours a day, 7 days a week. If you are in danger or need immediate medical attention call 911.  If you would like help to quit smoking, call 1-800-QUIT-NOW 727-833-7455) OR Espaol: 1-855-Djelo-Ya (1-017-510-2585) o para ms informacin haga clic aqu or Text READY to 200-400 to register via text  Arthur [name] - following are the goals we discussed in your visit today:   Goals Addressed   None     Social Worker will follow up in 30 days .   Arthur [name], BSW, [place]  High Risk Managed Medicaid Team  [phone]   Following is a copy of your plan of care:  Care Plan : [place] of Care  Updates made by [name] since 11/16/2021 12:00 AM     Problem: Chronic Disease Management and Care Coordination Needs for DMII, HTN, CAD, Seizures   Priority: High      Long-Range Goal: Development of Plan of Care for Chronic Disease Management and Care Coordination Needs (CAD, HTN, DMII, Seizures)   Start Date: 08/08/2021  Expected End Date: 12/06/2021  Priority: High  Note:   Current Barriers:  Knowledge Deficits related to plan of care for management of CAD, HTN, DMII, and Seizures  Care Coordination needs related to Financial constraints related to affordable safe housing and utility payment, Housing barriers, Medication procurement, ADL IADL limitations, Literacy concerns, Cognitive Deficits, Memory Deficits, Inability to perform IADL's independently, and Lacks knowledge of community resource: safe & affordable Section 8 Housing, utility payment assistance and dental care providers covered by Arthur [name].  Chronic Disease Management support and education needs related to CAD, HTN, DMII, and Seizures Financial Constraints.  Non-adherence to prescribed medication regimen Difficulty obtaining medications Cognitive Deficits No Advanced Directives in place Falls - Increased Potential for Falls  RNCM Clinical Goal(s):  Patient will verbalize understanding of plan for management of CAD, HTN, DMII, and Seizures as evidenced by improved management of these chronic diseases. verbalize basic understanding of CAD, HTN, DMII, and Seizures disease process and self health management plan as evidenced by noted improvement of management of these chronic diseases. take all medications exactly as prescribed and will call provider for medication related questions as evidenced by being compliant with all medications    attend all scheduled medical appointments: 11/01/2021 Echocardiogram; 11/16/21 [facility] BSW; 11/27/2021 Cardiologist; 11/30/2021 [facility] Pharmacist; 01/03/2022 Neurologist as evidenced by attending all scheduled appointments        demonstrate improved adherence to prescribed treatment plan  for CAD, HTN, DMII, and Seizures as evidenced by overall improved  management of these chronic diseases continue to work with Consulting civil engineer and/or Social Worker to address care management and care coordination needs related to CAD, HTN, DMII, and Seizures as evidenced by adherence to CM Team Scheduled appointments     work with pharmacist to address Financial constraints related to obtaining medications and Medication procurement related to CAD, HTN, DMII, and Seizures as evidenced by review of EMR and patient or pharmacist report    work with Education officer, museum to address Financial constraints related to safe affordable Housing and utility payment , Housing barriers, ADL IADL limitations, Cognitive Deficits, Memory Deficits, Inability to perform IADL's independently, and Lacks knowledge of community resource: safe and affordable Section 8 Housing, utility payment assistance and dental care providers covered by [employer] insurance related to the management of CAD, HTN, DMII, and Seizures as evidenced by review of EMR and patient or Education officer, museum report     through collaboration with Consulting civil engineer, provider, and care team.   Interventions: Inter-disciplinary care team collaboration (see longitudinal plan of care) Evaluation of current treatment plan related to  self management and patient's adherence to plan as established by provider BSW completed telephone outreach with patient and his wife. They stated they are currently in a hotel but are waiting on paperwork and inspections to be completed on the new house. They will be in the hotel until 08/24/21, but do not know when the new house will be ready to move in. Patient does not have any income right. They are needing assistance with paying the last utility bill from their previous home. BSW informed they can go to DSS and apply for the utility program, however since the services are disconnected due to moving, they may not assist. They provided an email address for the dental resources to be emailed to  [email or web address]. No other resources are needed at this time.  09/05/21: BSW completed follow up with patient's wife while patient was at an appointment. She stated they are still in the hotel and patient still does not have any income. They have applied for disability and are waiting for an appeal letter. [name] states they have been in contact with the housing caseworker and she did state that it can take some time.  10/05/21: BSW completed telephone outreach with patient and wife. She stated they moved into their new home on 09/26/21 and they are still unpacking. She states patient is doing really good. She did ask for some resources that will help with household items, BSW will put a list together and send to patient. 11/16/21: BSW completed telephone outreach with patient and wife. She stated they spoke with Medicaid and they asked him if he needed anything. She states patient still does not have any income and they are still waiting for a decision from disability. She did state that the aide has started coming in the home to assist with patient. No resources are needed at this time.  10/03/2021:  [name] informed this RN Care Manager that patient's disability has been denied.  Patient and wife recently moved from a hotel to a new home in [place].  New Address updated in patient's medical record.   CAD  (Status: Goal on Track (progressing): YES.) Long Term Goal  Assessed understanding of CAD diagnosis Medications reviewed including medications utilized in CAD treatment plan Provided education on importance of blood pressure control in management of CAD; Counseled on  the importance of exercise goals with target of 150 minutes per week Reviewed Importance of taking all medications as prescribed Reviewed Importance of attending all scheduled provider appointments Assessed social determinant of health barriers;  [name] denies patient having any chest pain, edema or shortness of  breath. Wife also reports patient weight is stable at 167 lbs.  Discussed with patient's wife the importance of increased exercise and activity for patient.  Discussed need for a cane to help with ambulation and to participate in ADLs and IADLs safely.  Will send request to PCP for order of a cane to help with ambulation now that patient has moved into permanent housing.   Wife was reluctant to have PCP order DME while they resided in a hotel but now that they have moved into permanent housing she would like DME to be ordered and delivered to home. Patient currently has all medications per patient's wife.  Patient needs refill on Entresto.  Will send message to cardiologist for refill order.  Diabetes:  (Status: Goal on Track (progressing): YES.) Long Term Goal  Lab Results  Component Value Date   HGBA1C 7.1 (H) 09/05/2021    Assessed patient's understanding of A1c goal: <7% Provided education to patient about basic DM disease process; Reviewed medications with patient and discussed importance of medication adherence;        Discussed plans with patient for ongoing care management follow up and provided patient with direct contact information for care management team;      Reviewed scheduled/upcoming provider appointments including: see above in [place];         Referral made to pharmacy team for assistance with complex medication regimen; [facility] Pharmacist involved in care;       Referral made to social work team for assistance with housing and utility payment assistance, dental care providers covered by Managed Medicaid; [facility] BSW involved in care;      Assessed social determinant of health barriers;        Patient has moved to a new home with wife recently.  Discussed need for a glucometer now that patient has moved to new residence form hotel.  Plan to collaborate with PCP in obtaining a glucometer for patient.  Patient currently has all medication per patient's wife.  [name]  denies any episodes or any signs/symptoms of Hypoglycemia or Hyperglycemia.   Seizures  (Status: Goal on Track (progressing): YES.) Long Term Goal  Evaluation of current treatment plan related to  Seizures ,  and any seizure activity,  self-management and patient's adherence to plan as established by provider. Discussed plans with patient for ongoing care management follow up and provided patient with direct contact information for care management team Advised patient to report any seizure activity to PCP; Reviewed medications with patient and discussed importance of medication compliance; Reviewed scheduled/upcoming provider appointments including : see above in [place]; Social Work referral for [employer], Scientist, product/process development and dental care providers; Pharmacy referral for complex medication regimen; Assessed social determinant of health barriers;  [name] reports no recent seizures.  Patient currently has all medications per patient's wife.  Hypertension: (Status: Goal on Track (progressing): YES.) Long Term Goal  Last practice recorded BP readings:  BP Readings from Last 3 Encounters:  09/05/21 128/78  08/28/21 132/78  07/11/21 128/74      Most recent eGFR/CrCl:  Lab Results  Component Value Date   EGFR 92 06/06/2021    No components found for: CRCL  Evaluation  of current treatment plan related to hypertension self management and patient's adherence to plan as established by provider;   Reviewed prescribed diet Low Salt, Heart Healthy Reviewed medications with patient and discussed importance of compliance;  Provided assistance with obtaining home blood pressure monitor via contacting  [place] to follow up on Blood Pressure monitor ordered last month.  [facility] staff confirmed that blood pressure monitor is ready.  I requested they deliver the monitor to patient's new home.  [facility] will deliver the monitor.;  Counseled on the importance of  exercise goals with target of 150 minutes per week Discussed plans with patient for ongoing care management follow up and provided patient with direct contact information for care management team; Reviewed scheduled/upcoming provider appointments including:  Assessed social determinant of health barriers;   Patient Goals/Self-Care Activities: Take medications as prescribed   Attend all scheduled provider appointments Call pharmacy for medication refills 3-7 days in advance of running out of medications Call provider office for new concerns or questions  Work with the social worker to address care coordination needs and will continue to work with the clinical team to address health care and disease management related needs Work to obtain a blood pressure monitor through [employer].  (See Note above under HTN) Work to obtain a glucometer for Blood sugar monitoring. Work to obtain a cane for assistance with ambulation.

## 2021-11-16 NOTE — Patient Outreach (Signed)
[name] Social Work Note  11/16/2021 Name:  Arthur [name] MRN:  [id number] DOB:  June 05, 1960  Arthur [name] is an 61 y.o. year old male who is a primary patient of Arthur Manes, MD.  The [name] Managed Care Coordination team was consulted for assistance with:  Community Resources   [name] was given information about [name] Managed Care Coordination team services today. Arthur [name] Patient and Primary Caregiver agreed to services and verbal consent obtained.  Engaged with patient  for by telephone forfollow up visit in response to referral for case management and/or care coordination services.   Assessments/Interventions:  Review of past medical history, allergies, medications, health status, including review of consultants reports, laboratory and other test data, was performed as part of comprehensive evaluation and provision of chronic care management services.  SDOH: (Social Determinant of Health) assessments and interventions performed: BSW completed telephone outreach with patient and wife. She stated they spoke with Arthur [name] and they asked him if he needed anything. She states patient still does not have any income and they are still waiting for a decision from disability. She did state that the aide has started coming in the home to assist with patient. No resources are needed at this time.   Advanced Directives Status:  Not addressed in this encounter.  Care Plan                 Allergies  Allergen Reactions   Penicillins Hives    Did it involve swelling of the face/tongue/throat, SOB, or low BP? Y Did it involve sudden or severe rash/hives, skin peeling, or any reaction on the inside of your mouth or nose? N Did you need to seek medical attention at a hospital or doctor's office? Y When did it last happen?  Over 5 Years Ago     If all above answers are "NO", may proceed with cephalosporin use.     Medications Reviewed Today     Reviewed by Su Monks,  CCC-SLP (Speech and Language Pathologist) on 11/14/21 at [phone]  Med List Status: <None>   Medication Order Taking? Sig Documenting Provider Last Dose Status Informant  amLODipine (NORVASC) 10 MG tablet [id number] No Take 1 tablet (10 mg total) by mouth daily. [name], [name], PA-C Taking Active Spouse/Significant Other  aspirin EC 81 MG tablet [id number] No Take 1 tablet (81 mg total) by mouth daily. Swallow whole.  Patient not taking: Reported on 10/19/2021   Alethia Berthold, PA-C Not Taking Active   atorvastatin (LIPITOR) 40 MG tablet [id number] No Take 1 tablet (40 mg total) by mouth daily. [name], [name], PA-C Taking Active Spouse/Significant Other  Blood Pressure Monitor KIT [id number]  Use to check blood pressure dailly. Arthur Manes, MD  Active   cholecalciferol (VITAMIN D) 25 MCG (1000 UNIT) tablet [id number] No Take by mouth.  Taking Active   hydrALAZINE (APRESOLINE) 50 MG tablet [id number] No Take 1 tablet (50 mg total) by mouth every 8 (eight) hours. [name], [name], PA-C Taking Active Spouse/Significant Other  isosorbide dinitrate (ISORDIL) 30 MG tablet [id number] No Take 1 tablet (30 mg total) by mouth 3 (three) times daily. [name], [name], PA-C Taking Active Spouse/Significant Other  lacosamide (Arthur [name]) 200 MG TABS tablet [id number] No Take 1 tablet (200 mg total) by mouth 2 (two) times daily. [name], MD Taking Active   levETIRAcetam (KEPPRA) 750 MG tablet [id number] No Take 2 tablets (1,500 mg total) by mouth 2 (two) times daily. Aslam,  Loralyn Freshwater, MD Taking Active   metFORMIN (GLUCOPHAGE) 500 MG tablet [id number] No Take 2 tablets (1,000 mg total) by mouth 2 (two) times daily with a meal. Aslam, Sadia, MD Taking Active   metoprolol tartrate (LOPRESSOR) 50 MG tablet [id number] No Take 1 tablet (50 mg total) by mouth 2 (two) times daily. [name], [name], PA-C Taking Active Spouse/Significant Other  sacubitril-valsartan (ENTRESTO) 49-51 MG [id number]  Take 1 tablet by  mouth 2 (two) times daily. Start on 06/29/2021 Alethia Berthold, PA-C  Active   Vitamin D, Cholecalciferol, 25 MCG (1000 UT) CAPS [id number] No Take 1 tablet by mouth daily. [name], MD Taking Active   Med List Note Arthur [name], CPhT 02/24/21 1756): Arthur [name]: "12/08/20 receiving PAP through Arthur [name], approved until 12/08/2022"            Patient Active Problem List   Diagnosis Date Noted   Vitamin D deficiency 09/07/2021   Cognitive impairment 09/06/2021   Coronary artery disease    HFrEF (heart failure with reduced ejection fraction) ([place]) 05/10/2021   Encephalopathy acute 03/11/2021   Seizures ([facility] [name]) 02/24/2021   Aortic atherosclerosis (Arthur [name]) 11/22/2020   History of seizures 11/22/2020   Alcohol use disorder, severe, dependence (Arthur [name]) 11/22/2020   Type 2 diabetes mellitus (Arthur [name]) 11/22/2020   Anemia 11/22/2020   Hyperlipidemia 11/22/2020   Acute encephalopathy 10/16/2020   Essential hypertension 10/08/2019   History of CVA (cerebrovascular accident) 10/02/2019    Conditions to be addressed/monitored per PCP order:   community resources  Care Plan : RN Care Manager Plan of Care  Updates made by [name] since 11/16/2021 12:00 AM     Problem: Chronic Disease Management and Care Coordination Needs for DMII, HTN, CAD, Seizures   Priority: High     Long-Range Goal: Development of Plan of Care for Chronic Disease Management and Care Coordination Needs (CAD, HTN, DMII, Seizures)   Start Date: 08/08/2021  Expected End Date: 12/06/2021  Priority: High  Note:   Current Barriers:  Knowledge Deficits related to plan of care for management of CAD, HTN, DMII, and Seizures  Care Coordination needs related to Financial constraints related to affordable safe housing and utility payment, Housing barriers, Medication procurement, ADL IADL limitations, Literacy concerns, Cognitive Deficits, Memory Deficits, Inability to perform IADL's independently, and Lacks knowledge of community  resource: safe & affordable Section 8 Housing, utility payment assistance and dental care providers covered by [name].  Chronic Disease Management support and education needs related to CAD, HTN, DMII, and Seizures Financial Constraints.  Non-adherence to prescribed medication regimen Difficulty obtaining medications Cognitive Deficits No Advanced Directives in place Falls - Increased Potential for Falls  RNCM Clinical Goal(s):  Patient will verbalize understanding of plan for management of CAD, HTN, DMII, and Seizures as evidenced by improved management of these chronic diseases. verbalize basic understanding of CAD, HTN, DMII, and Seizures disease process and self health management plan as evidenced by noted improvement of management of these chronic diseases. take all medications exactly as prescribed and will call provider for medication related questions as evidenced by being compliant with all medications    attend all scheduled medical appointments: 11/01/2021 Echocardiogram; 11/16/21 [facility] BSW; 11/27/2021 Cardiologist; 11/30/2021 [facility] Pharmacist; 01/03/2022 Neurologist as evidenced by attending all scheduled appointments        demonstrate improved adherence to prescribed treatment plan for CAD, HTN, DMII, and Seizures as evidenced by overall improved management of these chronic diseases continue to work with Consulting civil engineer and/or  Social Worker to address care management and care coordination needs related to CAD, HTN, DMII, and Seizures as evidenced by adherence to CM Team Scheduled appointments     work with pharmacist to address Financial constraints related to obtaining medications and Medication procurement related to CAD, HTN, DMII, and Seizures as evidenced by review of EMR and patient or pharmacist report    work with Education officer, museum to address Financial constraints related to safe affordable Housing and utility payment , Housing barriers, ADL IADL limitations, Cognitive  Deficits, Memory Deficits, Inability to perform IADL's independently, and Lacks knowledge of community resource: safe and affordable Section 8 Housing, utility payment assistance and dental care providers covered by [employer] insurance related to the management of CAD, HTN, DMII, and Seizures as evidenced by review of EMR and patient or Education officer, museum report     through collaboration with Consulting civil engineer, provider, and care team.   Interventions: Inter-disciplinary care team collaboration (see longitudinal plan of care) Evaluation of current treatment plan related to  self management and patient's adherence to plan as established by provider BSW completed telephone outreach with patient and his wife. They stated they are currently in a hotel but are waiting on paperwork and inspections to be completed on the new house. They will be in the hotel until 08/24/21, but do not know when the new house will be ready to move in. Patient does not have any income right. They are needing assistance with paying the last utility bill from their previous home. BSW informed they can go to DSS and apply for the utility program, however since the services are disconnected due to moving, they may not assist. They provided an email address for the dental resources to be emailed to [email or web address]. No other resources are needed at this time.  09/05/21: BSW completed follow up with patient's wife while patient was at an appointment. She stated they are still in the hotel and patient still does not have any income. They have applied for disability and are waiting for an appeal letter. [name] states they have been in contact with the housing caseworker and she did state that it can take some time.  10/05/21: BSW completed telephone outreach with patient and wife. She stated they moved into their new home on 09/26/21 and they are still unpacking. She states patient is doing really good. She did ask for some resources  that will help with household items, BSW will put a list together and send to patient. 11/16/21: BSW completed telephone outreach with patient and wife. She stated they spoke with Arthur [name] and they asked him if he needed anything. She states patient still does not have any income and they are still waiting for a decision from disability. She did state that the aide has started coming in the home to assist with patient. No resources are needed at this time.  10/03/2021:  [name] informed this RN Care Manager that patient's disability has been denied.  Patient and wife recently moved from a hotel to a new home in [place].  New Address updated in patient's medical record.   CAD  (Status: Goal on Track (progressing): YES.) Long Term Goal  Assessed understanding of CAD diagnosis Medications reviewed including medications utilized in CAD treatment plan Provided education on importance of blood pressure control in management of CAD; Counseled on the importance of exercise goals with target of 150 minutes per week Reviewed Importance of taking all medications as prescribed Reviewed Importance of attending  all scheduled provider appointments Assessed social determinant of health barriers;  [name] denies patient having any chest pain, edema or shortness of breath. Wife also reports patient weight is stable at 167 lbs.  Discussed with patient's wife the importance of increased exercise and activity for patient.  Discussed need for a cane to help with ambulation and to participate in ADLs and IADLs safely.  Will send request to PCP for order of a cane to help with ambulation now that patient has moved into permanent housing.   Wife was reluctant to have PCP order DME while they resided in a hotel but now that they have moved into permanent housing she would like DME to be ordered and delivered to home. Patient currently has all medications per patient's wife.  Patient needs refill on Entresto.  Will  send message to cardiologist for refill order.  Diabetes:  (Status: Goal on Track (progressing): YES.) Long Term Goal  Lab Results  Component Value Date   HGBA1C 7.1 (H) 09/05/2021    Assessed patient's understanding of A1c goal: <7% Provided education to patient about basic DM disease process; Reviewed medications with patient and discussed importance of medication adherence;        Discussed plans with patient for ongoing care management follow up and provided patient with direct contact information for care management team;      Reviewed scheduled/upcoming provider appointments including: see above in [place];         Referral made to pharmacy team for assistance with complex medication regimen; [facility] Pharmacist involved in care;       Referral made to social work team for assistance with housing and utility payment assistance, dental care providers covered by Managed Arthur [name]; [facility] BSW involved in care;      Assessed social determinant of health barriers;        Patient has moved to a new home with wife recently.  Discussed need for a glucometer now that patient has moved to new residence form hotel.  Plan to collaborate with PCP in obtaining a glucometer for patient.  Patient currently has all medication per patient's wife.  [name] denies any episodes or any signs/symptoms of Hypoglycemia or Hyperglycemia.   Seizures  (Status: Goal on Track (progressing): YES.) Long Term Goal  Evaluation of current treatment plan related to  Seizures ,  and any seizure activity,  self-management and patient's adherence to plan as established by provider. Discussed plans with patient for ongoing care management follow up and provided patient with direct contact information for care management team Advised patient to report any seizure activity to PCP; Reviewed medications with patient and discussed importance of medication compliance; Reviewed scheduled/upcoming provider appointments  including : see above in [place]; Social Work referral for [employer], Scientist, product/process development and dental care providers; Pharmacy referral for complex medication regimen; Assessed social determinant of health barriers;  [name] reports no recent seizures.  Patient currently has all medications per patient's wife.  Hypertension: (Status: Goal on Track (progressing): YES.) Long Term Goal  Last practice recorded BP readings:  BP Readings from Last 3 Encounters:  09/05/21 128/78  08/28/21 132/78  07/11/21 128/74      Most recent eGFR/CrCl:  Lab Results  Component Value Date   EGFR 92 06/06/2021    No components found for: CRCL  Evaluation of current treatment plan related to hypertension self management and patient's adherence to plan as established by provider;   Reviewed prescribed diet Low  Salt, Heart Healthy Reviewed medications with patient and discussed importance of compliance;  Provided assistance with obtaining home blood pressure monitor via contacting  [facility] to follow up on Blood Pressure monitor ordered last month.  [facility] staff confirmed that blood pressure monitor is ready.  I requested they deliver the monitor to patient's new home.  [facility] will deliver the monitor.;  Counseled on the importance of exercise goals with target of 150 minutes per week Discussed plans with patient for ongoing care management follow up and provided patient with direct contact information for care management team; Reviewed scheduled/upcoming provider appointments including:  Assessed social determinant of health barriers;   Patient Goals/Self-Care Activities: Take medications as prescribed   Attend all scheduled provider appointments Call pharmacy for medication refills 3-7 days in advance of running out of medications Call provider office for new concerns or questions  Work with the social worker to address care coordination needs and will continue to work  with the clinical team to address health care and disease management related needs Work to obtain a blood pressure monitor through [employer].  (See Note above under HTN) Work to obtain a glucometer for Blood sugar monitoring. Work to obtain a cane for assistance with ambulation.       Follow up:  Patient agrees to Care Plan and Follow-up.  Plan: The Managed Arthur [name] care management team will reach out to the patient again over the next 30 days.  Date/time of next scheduled Social Work care management/care coordination outreach:  12/18/21  [name], Arita Miss, [place] [name] Team  [phone]

## 2021-11-17 DIAGNOSIS — R4189 Other symptoms and signs involving cognitive functions and awareness: Secondary | ICD-10-CM | POA: Diagnosis not present

## 2021-11-18 ENCOUNTER — Encounter: Payer: Self-pay | Admitting: *Deleted

## 2021-11-21 ENCOUNTER — Telehealth: Payer: Self-pay | Admitting: *Deleted

## 2021-11-21 DIAGNOSIS — R4189 Other symptoms and signs involving cognitive functions and awareness: Secondary | ICD-10-CM | POA: Diagnosis not present

## 2021-11-22 DIAGNOSIS — R4189 Other symptoms and signs involving cognitive functions and awareness: Secondary | ICD-10-CM | POA: Diagnosis not present

## 2021-11-23 ENCOUNTER — Other Ambulatory Visit: Payer: Medicaid Other

## 2021-11-23 DIAGNOSIS — R4189 Other symptoms and signs involving cognitive functions and awareness: Secondary | ICD-10-CM | POA: Diagnosis not present

## 2021-11-27 ENCOUNTER — Other Ambulatory Visit: Payer: Self-pay

## 2021-11-27 ENCOUNTER — Other Ambulatory Visit: Payer: Self-pay | Admitting: *Deleted

## 2021-11-27 ENCOUNTER — Ambulatory Visit: Payer: Medicaid Other | Admitting: Student

## 2021-11-27 ENCOUNTER — Telehealth: Payer: Self-pay | Admitting: Student

## 2021-11-27 ENCOUNTER — Other Ambulatory Visit (HOSPITAL_COMMUNITY): Payer: Self-pay

## 2021-11-27 DIAGNOSIS — R4189 Other symptoms and signs involving cognitive functions and awareness: Secondary | ICD-10-CM | POA: Diagnosis not present

## 2021-11-27 DIAGNOSIS — R569 Unspecified convulsions: Secondary | ICD-10-CM

## 2021-11-27 MED ORDER — AMLODIPINE BESYLATE 10 MG PO TABS
10.0000 mg | ORAL_TABLET | Freq: Every day | ORAL | 3 refills | Status: DC
Start: 2021-11-27 — End: 2021-12-21
  Filled 2021-11-27 (×2): qty 90, 90d supply, fill #0

## 2021-11-27 MED ORDER — LACOSAMIDE 200 MG PO TABS: 200.0000 mg | ORAL_TABLET | Freq: Two times a day (BID) | ORAL | 3 refills | Status: DC

## 2021-11-27 MED ORDER — ATORVASTATIN CALCIUM 40 MG PO TABS
40.0000 mg | ORAL_TABLET | Freq: Every day | ORAL | 3 refills | Status: DC
  Filled 2021-11-27 (×2): qty 90, 90d supply, fill #0

## 2021-11-27 NOTE — Telephone Encounter (Addendum)
[name] called for refills.  Patient has not been in for over a year.  Wants Lipitor and Norvasc to go to [facility] and the Vimpat to go to [facility].  Needs an appointment.

## 2021-11-27 NOTE — Telephone Encounter (Signed)
[name] calling for a refill on the following medications  atorvastatin (LIPITOR) 40 MG tablet [facility]  lacosamide (VIMPAT) 200 MG TABS tablet   [facility]  amLODipine (NORVASC) 10 MG tablet  [facility]

## 2021-11-29 DIAGNOSIS — R4189 Other symptoms and signs involving cognitive functions and awareness: Secondary | ICD-10-CM | POA: Diagnosis not present

## 2021-11-30 ENCOUNTER — Other Ambulatory Visit: Payer: Self-pay | Admitting: Pharmacist

## 2021-11-30 ENCOUNTER — Other Ambulatory Visit (HOSPITAL_COMMUNITY): Payer: Self-pay

## 2021-11-30 DIAGNOSIS — I5020 Unspecified systolic (congestive) heart failure: Secondary | ICD-10-CM

## 2021-11-30 DIAGNOSIS — R4189 Other symptoms and signs involving cognitive functions and awareness: Secondary | ICD-10-CM | POA: Diagnosis not present

## 2021-11-30 DIAGNOSIS — I10 Essential (primary) hypertension: Secondary | ICD-10-CM

## 2021-11-30 NOTE — Patient Instructions (Signed)
Arthur [name] and [name],   It was great working with you! Please reach out if any future needs arise.   Catie Eppie Gibson, PharmD, [facility] [phone]

## 2021-11-30 NOTE — Chronic Care Management (AMB) (Signed)
Chief Complaint  Patient presents with   High Risk Managed Medicaid    Arthur [name] is a 61 y.o. year old male who presented for a telephone visit.   They were referred to the pharmacist by their High Risk Managed Medicaid Care Team  for assistance in managing complex medication management.   Patient is participating in a Managed Medicaid Plan:  Yes  Subjective:  Care Team: Primary Care Provider: [name], DO ; Next Scheduled Visit: 12/21/21 Cardiologist: Arthur [name]; Next Scheduled Visit: 01/05/22, ECHO scheduled 11/2521 Neurologist: Arthur [name]; Next Scheduled Visit: 01/23/22  Medication Access/Adherence  Current Pharmacy:  Arthur [name] [address] [place] [address] Phone: [phone] Fax: [place] [address] Phone: [phone] Fax: [phone]   Patient reports affordability concerns with their medications: No  Patient reports access/transportation concerns to their pharmacy: No  - reports the delivery driver first went to the old address, but is supposed to be bringing meds to the new address later today Patient reports adherence concerns with their medications:  No     Diabetes:   Current medications: metformin 1000 mg twice daily     Hyperlipidemia/ASCVD Risk Reduction   Current lipid lowering medications: atorvastatin 40 mg daily   Antiplatelet regimen: aspirin 81 mg daily, reports she needs to get a new bottle   Heart Failure:   Current medications:  ACEi/ARB/ARNI: Entresto 49/51 mg daily  SGLT2i: none, consider moving forward Beta blocker: metoprolol tartrate 50 mg twice daily Mineralocorticoid Receptor Antagonist: none, consider moving forward Diuretic regimen: none; Additional antihypertensives: amlodipine 10 mg daily; hydralazine/isosorbide 50/30 mg three times daily   Current home blood pressure readings:  checking periodically at home; Arthur [name] does not remember any specific readings but reports they are "controlled"   Upcoming ECHO later this month; had to be rescheduled due to transportation.   Seizure Disorder: Current medications: lacosamide 200 mg twice daily, levetiracetam 1500 mg twice daily    Health Maintenance  Health Maintenance Due  Topic Date Due   FOOT EXAM  Never done   OPHTHALMOLOGY EXAM  Never done   TETANUS/TDAP  Never done   COLONOSCOPY (Pts 45-60yrs Insurance coverage will need to be confirmed)  Never done   Zoster Vaccines- Shingrix (1 of 2) Never done   COVID-19 Vaccine (2 - Pfizer series) 04/05/2020     Objective: Lab Results  Component Value Date   HGBA1C 7.1 (H) 09/05/2021    Lab Results  Component Value Date   CREATININE 1.29 (H) 09/05/2021   BUN 20 09/05/2021   NA 139 09/05/2021   K 4.4 09/05/2021   CL 101 09/05/2021   CO2 24 09/05/2021    Lab Results  Component Value Date   CHOL 147 03/23/2021   HDL 36 (L) 03/23/2021   LDLCALC 86 03/23/2021   LDLDIRECT 124.3 (H) 10/17/2020   TRIG 127 03/23/2021   CHOLHDL 4.1 03/23/2021    Medications Reviewed Today     Reviewed by Arthur [name], RPH-CPP (Pharmacist) on 11/30/21 at [place] List Status: <None>   Medication Order Taking? Sig Documenting Provider Last Dose Status Informant  amLODipine (NORVASC) 10 MG tablet [id number] Yes Take 1 tablet (10 mg total) by mouth daily. Arthur [name] Taking Active Spouse/Significant Other  amLODipine (NORVASC) 10 MG tablet [id number]  Take 1 tablet (10 mg total) by mouth daily. Arthur [name]  Active   aspirin  EC 81 MG tablet [id number] Yes Take 1 tablet (81 mg total) by mouth daily. Swallow whole. Arthur [name] Taking Active   atorvastatin (LIPITOR) 40 MG tablet [id number] Yes Take 1 tablet (40 mg total) by mouth daily. Arthur [name] Taking Active Spouse/Significant Other  atorvastatin (LIPITOR) 40 MG tablet [id number]   Take 1 tablet (40 mg total) by mouth daily. Arthur [name]  Active   Blood Pressure Monitor KIT [id number] Yes Use to check blood pressure dailly. Arthur [name] Taking Active   cholecalciferol (VITAMIN D) 25 MCG (1000 UNIT) tablet [id number] Yes Take by mouth.  Taking Active   hydrALAZINE (APRESOLINE) 50 MG tablet [id number] Yes Take 1 tablet (50 mg total) by mouth every 8 (eight) hours. Arthur [name] Taking Active Spouse/Significant Other  isosorbide dinitrate (ISORDIL) 30 MG tablet [id number] Yes Take 1 tablet (30 mg total) by mouth 3 (three) times daily. Arthur [name] Taking Active Spouse/Significant Other  lacosamide (VIMPAT) 200 MG TABS tablet [id number] Yes Take 1 tablet (200 mg total) by mouth 2 (two) times daily. Arthur [name] Taking Active   lacosamide (VIMPAT) 200 MG TABS tablet [id number]  Take 1 tablet (200 mg total) by mouth 2 (two) times daily. Arthur [name]  Active   levETIRAcetam (KEPPRA) 750 MG tablet [id number] Yes Take 2 tablets (1,500 mg total) by mouth 2 (two) times daily. Arthur [name] Taking Active   metFORMIN (GLUCOPHAGE) 500 MG tablet [id number] Yes Take 2 tablets (1,000 mg total) by mouth 2 (two) times daily with a meal. Arthur [name] Taking Active   metoprolol tartrate (LOPRESSOR) 50 MG tablet [id number] Yes Take 1 tablet (50 mg total) by mouth 2 (two) times daily. Arthur [name] Taking Active Spouse/Significant Other  sacubitril-valsartan (ENTRESTO) 49-51 MG [id number] Yes Take 1 tablet by mouth 2 (two) times daily. Start on 06/29/2021 Arthur [name] Taking Active   Vitamin D, Cholecalciferol, 25 MCG (1000 UT) CAPS [id number]  Take 1 tablet by mouth daily. Arthur [name]  Active   Med List Note Arthur [name] 1756): Vimpat: "12/08/20 receiving PAP through [employer], approved until 12/08/2022"              Assessment/Plan:   Diabetes: - Currently uncontrolled but anticipate improvement.  Recommend A1c with upcoming PCP appointment. - Recommend to continue current regimen at this time. Consider SGLT2 moving forward for dual DM/HF benefit  Hyperlipidemia/ASCVD Risk Reduction: - Currently uncontrolled but anticipate improvement. Recommend lipid panel with upcoming PCP appointment. -  Recommend to continue current regimen at this time  Heart Failure: - Currently appropriately managed - Recommend to continue current regimen at this time. Reviewed upcoming appointment with cardiology and ECHO  Seizure Disorder: - Current controlled - Recommend to continue current regimen at this time. Reviewed upcoming appointment with neurology.   Health Maintenance - Discussed CDC/ACIP recommendations for Shingrix, Tdap, COVID vaccine(s). Recommended to get first Shingrix dose at upcoming PCP appointment,  - Discussed screening recommendations for eye exam and foot exam. Appears eye exam referral went to [facility] - provided phone number for [facility] to follow up on scheduling.  Encouraged to discuss foot exam with PCP at next appointment.    Follow Up Plan: goals of pharmacy care met. Follow up with RN CM and BSW as scheduled.   Catie Hedwig Morton, PharmD, [facility] [phone]

## 2021-12-01 DIAGNOSIS — R4189 Other symptoms and signs involving cognitive functions and awareness: Secondary | ICD-10-CM | POA: Diagnosis not present

## 2021-12-05 ENCOUNTER — Other Ambulatory Visit: Payer: Self-pay | Admitting: Obstetrics and Gynecology

## 2021-12-05 NOTE — Patient Outreach (Signed)
Medicaid Managed Care   Nurse Care Manager Note  12/05/2021 Name:  Arthur [name] MRN:  [id number] DOB:  [birth year]  Arthur [name] is an 61 y.o. year old male who is a primary patient of [name], DO.  The [facility] Managed Care Coordination team was consulted for assistance with:    Chronic healthcare management needs, HTN, DM, seizures, tobacco use, CAD, HLD, cognitive impairment  [name] / Ms. Larey Seat was given information about Medicaid Managed Care Coordination team services today. [name] [employer] (DPR) agreed to services and verbal consent obtained.  Engaged with patient/ DPR  by telephone for follow up visit in response to provider referral for case management and/or care coordination services.   Assessments/Interventions:  Review of past medical history, allergies, medications, health status, including review of consultants reports, laboratory and other test data, was performed as part of comprehensive evaluation and provision of chronic care management services.  SDOH (Social Determinants of Health) assessments and interventions performed:  Care Plan  Allergies  Allergen Reactions   Penicillins Hives    Did it involve swelling of the face/tongue/throat, SOB, or low BP? Y Did it involve sudden or severe rash/hives, skin peeling, or any reaction on the inside of your mouth or nose? N Did you need to seek medical attention at a hospital or doctor's office? Y When did it last happen?  Over 5 Years Ago     If all above answers are "NO", may proceed with cephalosporin use.    Medications Reviewed Today     Reviewed by Gayla Medicus, RN (Registered Nurse) on 12/05/21 at 70  Med List Status: <None>   Medication Order Taking? Sig Documenting Provider Last Dose Status Informant  amLODipine (NORVASC) 10 MG tablet [id number] No Take 1 tablet (10 mg total) by mouth daily. [name], [name], PA-C Taking Active Spouse/Significant Other  amLODipine (NORVASC)  10 MG tablet [id number]  Take 1 tablet (10 mg total) by mouth daily. Linward Natal, MD  Active   aspirin EC 81 MG tablet [id number] No Take 1 tablet (81 mg total) by mouth daily. Swallow whole. [name], [name], PA-C Taking Active   atorvastatin (LIPITOR) 40 MG tablet [id number] No Take 1 tablet (40 mg total) by mouth daily. [name], [name], PA-C Taking Active Spouse/Significant Other  atorvastatin (LIPITOR) 40 MG tablet [id number]  Take 1 tablet (40 mg total) by mouth daily. Linward Natal, MD  Active   Blood Pressure Monitor KIT [id number] No Use to check blood pressure dailly. [name], MD Taking Active   cholecalciferol (VITAMIN D) 25 MCG (1000 UNIT) tablet [id number] No Take by mouth.  Taking Active   hydrALAZINE (APRESOLINE) 50 MG tablet [id number] No Take 1 tablet (50 mg total) by mouth every 8 (eight) hours. [name], [name], PA-C Taking Active Spouse/Significant Other  isosorbide dinitrate (ISORDIL) 30 MG tablet [id number] No Take 1 tablet (30 mg total) by mouth 3 (three) times daily. [name], [name], PA-C Taking Active Spouse/Significant Other  lacosamide (VIMPAT) 200 MG TABS tablet [id number] No Take 1 tablet (200 mg total) by mouth 2 (two) times daily. [name], MD Taking Active   lacosamide (VIMPAT) 200 MG TABS tablet [id number]  Take 1 tablet (200 mg total) by mouth 2 (two) times daily. Linward Natal, MD  Active   levETIRAcetam (KEPPRA) 750 MG tablet [id number] No Take 2 tablets (1,500 mg total) by mouth 2 (two) times daily. [name], MD Taking Active   metFORMIN (GLUCOPHAGE) 500 MG  tablet [id number] No Take 2 tablets (1,000 mg total) by mouth 2 (two) times daily with a meal. Aslam, Sadia, MD Taking Active   metoprolol tartrate (LOPRESSOR) 50 MG tablet [id number] No Take 1 tablet (50 mg total) by mouth 2 (two) times daily. [name], [name], PA-C Taking Active Spouse/Significant Other  sacubitril-valsartan (ENTRESTO) 49-51 MG [id number] No Take 1 tablet by mouth 2  (two) times daily. Start on 06/29/2021 Alethia Berthold, PA-C Taking Active   Vitamin D, Cholecalciferol, 25 MCG (1000 UT) CAPS [id number] No Take 1 tablet by mouth daily. [name], MD Taking Active   Med List Note [name], CPhT 02/24/21 1756): Vimpat: "12/08/20 receiving PAP through [employer], approved until 12/08/2022"           Patient Active Problem List   Diagnosis Date Noted   Vitamin D deficiency 09/07/2021   Cognitive impairment 09/06/2021   Coronary artery disease    HFrEF (heart failure with reduced ejection fraction) ([place]) 05/10/2021   Encephalopathy acute 03/11/2021   Seizures ([place]) 02/24/2021   Aortic atherosclerosis ([place]) 11/22/2020   History of seizures 11/22/2020   Alcohol use disorder, severe, dependence ([place]) 11/22/2020   Type 2 diabetes mellitus ([place]) 11/22/2020   Anemia 11/22/2020   Hyperlipidemia 11/22/2020   Acute encephalopathy 10/16/2020   Essential hypertension 10/08/2019   History of CVA (cerebrovascular accident) 10/02/2019   Conditions to be addressed/monitored per PCP order:  HTN, DM, seizures, HLD, CAD, tobacco use, cognitive deficits.  Care Plan : RN Care Manager Plan of Care  Updates made by Gayla Medicus, RN since 12/05/2021 12:00 AM     Problem: Chronic Disease Management and Care Coordination Needs for DMII, HTN, CAD, Seizures   Priority: High     Long-Range Goal: Development of Plan of Care for Chronic Disease Management and Care Coordination Needs (CAD, HTN, DMII, Seizures)   Start Date: 08/08/2021  Expected End Date: 03/07/2022  Priority: High  Note:   Current Barriers:  Knowledge Deficits related to plan of care for management of CAD, HTN, DMII, and Seizures  Care Coordination needs related to Financial constraints related to affordable safe housing and utility payment, Housing barriers, Medication procurement, ADL IADL limitations, Literacy concerns, Cognitive Deficits, Memory Deficits, Inability to perform IADL's  independently, and Lacks knowledge of community resource: safe & affordable Section 8 Housing, utility payment assistance and dental care providers covered by [name].  Chronic Disease Management support and education needs related to CAD, HTN, DMII, and Seizures Financial Constraints.  Non-adherence to prescribed medication regimen Difficulty obtaining medications Cognitive Deficits No Advanced Directives in place [place] - Increased Potential for [place] 12/05/21:  Per [name], checking blood pressure occasionally, but does not recall reading.  To inquire about CBG at next PCP appt 7/27 as patient does not like to do finger sticks and does not want to check blood sugar.  [name] to follow up on OT/ST for patient at 7/27 visit.  Patient and wife have now moved into Section 8 housing.  Smokes 2-4 cigars a day-not interested in stopping.  [name] given 1-800-QUITNOW information.Has upcoming appts with SW, PCP and appt for ECHO.  RNCM Clinical Goal(s):  Patient will verbalize understanding of plan for management of CAD, HTN, DMII, and Seizures as evidenced by improved management of these chronic diseases. verbalize basic understanding of CAD, HTN, DMII, and Seizures disease process and self health management plan as evidenced by noted improvement of management of these chronic diseases. take all medications  exactly as prescribed and will call provider for medication related questions as evidenced by being compliant with all medications    attend all scheduled medical appointments     demonstrate improved adherence to prescribed treatment plan for CAD, HTN, DMII, and Seizures as evidenced by overall improved management of these chronic diseases continue to work with Consulting civil engineer and/or Social Worker to address care management and care coordination needs related to CAD, HTN, DMII, and Seizures as evidenced by adherence to CM Team Scheduled appointments     work with  pharmacist to address Financial constraints related to obtaining medications and Medication procurement related to CAD, HTN, DMII, and Seizures as evidenced by review of EMR and patient or pharmacist report    work with Education officer, museum to address Financial constraints related to safe affordable Housing and utility payment , Housing barriers, ADL IADL limitations, Cognitive Deficits, Memory Deficits, Inability to perform IADL's independently, and Lacks knowledge of community resource: safe and affordable Section 8 Housing, utility payment assistance and dental care providers covered by [employer] insurance related to the management of CAD, HTN, DMII, and Seizures as evidenced by review of EMR and patient or Education officer, museum report     through collaboration with Consulting civil engineer, provider, and care team.   Interventions: Inter-disciplinary care team collaboration (see longitudinal plan of care) Evaluation of current treatment plan related to  self management and patient's adherence to plan as established by provider BSW completed telephone outreach with patient and his wife. They stated they are currently in a hotel but are waiting on paperwork and inspections to be completed on the new house. They will be in the hotel until 08/24/21, but do not know when the new house will be ready to move in. Patient does not have any income right. They are needing assistance with paying the last utility bill from their previous home. BSW informed they can go to DSS and apply for the utility program, however since the services are disconnected due to moving, they may not assist. They provided an email address for the dental resources to be emailed to [email or web address]. No other resources are needed at this time.  09/05/21: BSW completed follow up with patient's wife while patient was at an appointment. She stated they are still in the hotel and patient still does not have any income. They have applied for disability and are  waiting for an appeal letter. [name] states they have been in contact with the housing caseworker and she did state that it can take some time.  10/05/21: BSW completed telephone outreach with patient and wife. She stated they moved into their new home on 09/26/21 and they are still unpacking. She states patient is doing really good. She did ask for some resources that will help with household items, BSW will put a list together and send to patient. 11/16/21: BSW completed telephone outreach with patient and wife. She stated they spoke with Medicaid and they asked him if he needed anything. She states patient still does not have any income and they are still waiting for a decision from disability. She did state that the aide has started coming in the home to assist with patient. No resources are needed at this time.  10/03/2021:  [name] informed this RN Care Manager that patient's disability has been denied.  Patient and wife recently moved from a hotel to a new home in [place].  New Address updated in patient's medical record.  CAD  (Status:  Goal on Track (progressing): YES.) Long Term Goal  Assessed understanding of CAD diagnosis Medications reviewed including medications utilized in CAD treatment plan Provided education on importance of blood pressure control in management of CAD; Counseled on the importance of exercise goals with target of 150 minutes per week Reviewed Importance of taking all medications as prescribed Reviewed Importance of attending all scheduled provider appointments Assessed social determinant of health barriers;  [name] denies patient having any chest pain, edema or shortness of breath. Wife also reports patient weight is stable at 167 lbs.  Discussed with patient's wife the importance of increased exercise and activity for patient.  Discussed need for a cane to help with ambulation and to participate in ADLs and IADLs safely.  Will send request to PCP for  order of a cane to help with ambulation now that patient has moved into permanent housing.   Wife was reluctant to have PCP order DME while they resided in a hotel but now that they have moved into permanent housing she would like DME to be ordered and delivered to home. Patient currently has all medications per patient's wife.    Diabetes:  (Status: Goal on Track (progressing): YES.) Long Term Goal  Lab Results  Component Value Date   HGBA1C 7.1 (H) 09/05/2021    Assessed patient's understanding of A1c goal: <7% Provided education to patient about basic DM disease process; Reviewed medications with patient and discussed importance of medication adherence;        Discussed plans with patient for ongoing care management follow up and provided patient with direct contact information for care management team;      Reviewed scheduled/upcoming provider appointments including: see above in [place];         Referral made to pharmacy team for assistance with complex medication regimen; [facility] Pharmacist involved in care;       Referral made to social work team for assistance with housing and utility payment assistance, dental care providers covered by Managed Medicaid; [facility] BSW involved in care;      Assessed social determinant of health barriers;        Patient has moved to a new home with wife recently.  Discussed need for a glucometer now that patient has moved to new residence form hotel.   Patient currently has all medication per patient's wife.  [name] denies any episodes or any signs/symptoms of Hypoglycemia or Hyperglycemia.   Seizures  (Status: Goal on Track (progressing): YES.) Long Term Goal  Evaluation of current treatment plan related to  Seizures ,  and any seizure activity,  self-management and patient's adherence to plan as established by provider. Discussed plans with patient for ongoing care management follow up and provided patient with direct contact information for  care management team Advised patient to report any seizure activity to PCP; Reviewed medications with patient and discussed importance of medication compliance; Reviewed scheduled/upcoming provider appointments including : see above in [place]; Social Work referral for [employer], Scientist, product/process development and dental care providers; Pharmacy referral for complex medication regimen; Assessed social determinant of health barriers;  [name] reports no recent seizures.  Patient currently has all medications per patient's wife.  Hypertension: (Status: Goal on Track (progressing): YES.) Long Term Goal  Last practice recorded BP readings:  BP Readings from Last 3 Encounters:  09/05/21 128/78  08/28/21 132/78  07/11/21 128/74      Most recent eGFR/CrCl:  Lab Results  Component Value Date  EGFR 92 06/06/2021    No components found for: CRCL  Evaluation of current treatment plan related to hypertension self management and patient's adherence to plan as established by provider;   Reviewed prescribed diet Low Salt, Heart Healthy Reviewed medications with patient and discussed importance of compliance;  Provided assistance with obtaining home blood pressure monitor via contacting  [facility] to follow up on Blood Pressure monitor ordered last month.  [facility] staff confirmed that blood pressure monitor is ready.  I requested they deliver the monitor to patient's new home.  [facility] will deliver the monitor.;  Counseled on the importance of exercise goals with target of 150 minutes per week Discussed plans with patient for ongoing care management follow up and provided patient with direct contact information for care management team; Reviewed scheduled/upcoming provider appointments including:  Assessed social determinant of health barriers;   Patient Goals/Self-Care Activities: Take medications as prescribed   Attend all scheduled provider appointments Call pharmacy for  medication refills 3-7 days in advance of running out of medications Call provider office for new concerns or questions  Work with the social worker to address care coordination needs and will continue to work with the clinical team to address health care and disease management related needs   Long-Range Goal: Establish Plan of Care for Chronic Disease Management Needs   Priority: High  Note:   Timeframe:  Long-Range Goal Priority:  High Start Date:    12/05/21                         Expected End Date:  ongoing                     Follow Up Date 01/08/22    - schedule appointment for vaccines needed due to my age or health - schedule recommended health tests - schedule and keep appointment for annual check-up    Why is this important?   Screening tests can find diseases early when they are easier to treat.  Your doctor or nurse will talk with you about which tests are important for you.  Getting shots for common diseases like the flu and shingles will help prevent them.     Follow Up:  Patient / DPR agrees to Care Plan and Follow-up.  Plan: The Managed Medicaid care management team will reach out to the patient /DPR again over the next 30 business days. and The  [employer] (DPR) has been provided with contact information for the Managed Medicaid care management team and has been advised to call with any health related questions or concerns.  Date/time of next scheduled RN care management/care coordination outreach:  01/08/22 at 315.

## 2021-12-05 NOTE — Patient Instructions (Signed)
Hi [name], thanks for speaking with me today-have a wonderful afternoon!!  Arthur [name] / [name] was given information about Medicaid Managed Care team care coordination services as a part of their [place] Medicaid benefit. Arthur [name] / [name] verbally consented to engagement with the [facility] Managed Care team.   If you are experiencing a medical emergency, please call 911 or report to your local emergency department or urgent care.   If you have a non-emergency medical problem during routine business hours, please contact your provider's office and ask to speak with a nurse.   For questions related to your [facility], please call: [phone] or visit the homepage here: [email or web address]  If you would like to schedule transportation through your [facility], please call the following number at least 2 days in advance of your appointment: [phone]   Rides for urgent appointments can also be made after hours by calling Member Services.  Call the [facility] at [phone], at any time, 24 hours a day, 7 days a week. If you are in danger or need immediate medical attention call 911.  If you would like help to quit smoking, call 1-800-QUIT-NOW 815-791-6527) OR Espaol: 1-855-Djelo-Ya (0-626-948-5462) o para ms informacin haga clic aqu or Text READY to 200-400 to register via text  Arthur [name] / [name] - following are the goals we discussed in your visit today:   Goals Addressed    Timeframe:  Long-Range Goal Priority:  High Start Date:    12/05/21                         Expected End Date:  ongoing                     Follow Up Date 01/08/22    - schedule appointment for vaccines needed due to my age or health - schedule recommended health tests - schedule and keep appointment for annual check-up    Why is  this important?   Screening tests can find diseases early when they are easier to treat.  Your doctor or nurse will talk with you about which tests are important for you.  Getting shots for common diseases like the flu and shingles will help prevent them.    Patient / DPR verbalizes understanding of instructions and care plan provided today and agrees to view in [place]. Active MyChart status and patient understanding of how to access instructions and care plan via MyChart confirmed with patient.     The Managed Medicaid care management team will reach out to the patient / DPR again over the next 30 business days.  The  [employer] (DPR) has been provided with contact information for the Managed Medicaid care management team and has been advised to call with any health related questions or concerns.   Arthur Raider RN, BSN [place] Management Coordinator - Managed Medicaid High Risk [phone]   Following is a copy of your plan of care:  Care Plan : Hypertension (Adult)  Updates made by Arthur Medicus, RN since 12/05/2021 12:00 AM     Care Plan : RN Care Manager Plan of Care  Updates made by Arthur Medicus, RN since 12/05/2021 12:00 AM     Problem: Chronic Disease Management and Care Coordination Needs for DMII, HTN, CAD, Seizures   Priority: High  Long-Range Goal: Development of Plan of Care for Chronic Disease Management and Care Coordination Needs (CAD, HTN, DMII, Seizures)   Start Date: 08/08/2021  Expected End Date: 03/07/2022  Priority: High  Note:   Current Barriers:  Knowledge Deficits related to plan of care for management of CAD, HTN, DMII, and Seizures  Care Coordination needs related to Financial constraints related to affordable safe housing and utility payment, Housing barriers, Medication procurement, ADL IADL limitations, Literacy concerns, Cognitive Deficits, Memory Deficits, Inability to perform IADL's independently,  and Lacks knowledge of community resource: safe & affordable Section 8 Housing, utility payment assistance and dental care providers covered by [name].  Chronic Disease Management support and education needs related to CAD, HTN, DMII, and Seizures Financial Constraints.  Non-adherence to prescribed medication regimen Difficulty obtaining medications Cognitive Deficits No Advanced Directives in place Falls - Increased Potential for Falls 12/05/21:  Per [name], checking blood pressure occasionally, but does not recall reading.  To inquire about CBG at next PCP appt 7/27 as patient does not like to do finger sticks and does not want to check blood sugar.  [name] to follow up on OT/ST for patient at 7/27 visit.  Patient and wife have now moved into Section 8 housing.  Smokes 2-4 cigars a day-not interested in stopping.  [name] given 1-800-QUITNOW information.Has upcoming appts with SW, PCP and appt for ECHO.  RNCM Clinical Goal(s):  Patient will verbalize understanding of plan for management of CAD, HTN, DMII, and Seizures as evidenced by improved management of these chronic diseases. verbalize basic understanding of CAD, HTN, DMII, and Seizures disease process and self health management plan as evidenced by noted improvement of management of these chronic diseases. take all medications exactly as prescribed and will call provider for medication related questions as evidenced by being compliant with all medications    attend all scheduled medical appointments     demonstrate improved adherence to prescribed treatment plan for CAD, HTN, DMII, and Seizures as evidenced by overall improved management of these chronic diseases continue to work with Consulting civil engineer and/or Social Worker to address care management and care coordination needs related to CAD, HTN, DMII, and Seizures as evidenced by adherence to CM Team Scheduled appointments     work with pharmacist to  address Financial constraints related to obtaining medications and Medication procurement related to CAD, HTN, DMII, and Seizures as evidenced by review of EMR and patient or pharmacist report    work with Education officer, museum to address Financial constraints related to safe affordable Housing and utility payment , Housing barriers, ADL IADL limitations, Cognitive Deficits, Memory Deficits, Inability to perform IADL's independently, and Lacks knowledge of community resource: safe and affordable Section 8 Housing, utility payment assistance and dental care providers covered by [employer] insurance related to the management of CAD, HTN, DMII, and Seizures as evidenced by review of EMR and patient or Education officer, museum report     through collaboration with Consulting civil engineer, provider, and care team.   Interventions: Inter-disciplinary care team collaboration (see longitudinal plan of care) Evaluation of current treatment plan related to  self management and patient's adherence to plan as established by provider BSW completed telephone outreach with patient and his wife. They stated they are currently in a hotel but are waiting on paperwork and inspections to be completed on the new house. They will be in the hotel until 08/24/21, but do not know when the new house will be ready to  move in. Patient does not have any income right. They are needing assistance with paying the last utility bill from their previous home. BSW informed they can go to DSS and apply for the utility program, however since the services are disconnected due to moving, they may not assist. They provided an email address for the dental resources to be emailed to [email or web address]. No other resources are needed at this time.  09/05/21: BSW completed follow up with patient's wife while patient was at an appointment. She stated they are still in the hotel and patient still does not have any income. They have applied for disability and are waiting for an  appeal letter. [name] states they have been in contact with the housing caseworker and she did state that it can take some time.  10/05/21: BSW completed telephone outreach with patient and wife. She stated they moved into their new home on 09/26/21 and they are still unpacking. She states patient is doing really good. She did ask for some resources that will help with household items, BSW will put a list together and send to patient. 11/16/21: BSW completed telephone outreach with patient and wife. She stated they spoke with Medicaid and they asked him if he needed anything. She states patient still does not have any income and they are still waiting for a decision from disability. She did state that the aide has started coming in the home to assist with patient. No resources are needed at this time.  10/03/2021:  [name] informed this RN Care Manager that patient's disability has been denied.  Patient and wife recently moved from a hotel to a new home in [place].  New Address updated in patient's medical record.  CAD  (Status: Goal on Track (progressing): YES.) Long Term Goal  Assessed understanding of CAD diagnosis Medications reviewed including medications utilized in CAD treatment plan Provided education on importance of blood pressure control in management of CAD; Counseled on the importance of exercise goals with target of 150 minutes per week Reviewed Importance of taking all medications as prescribed Reviewed Importance of attending all scheduled provider appointments Assessed social determinant of health barriers;  [name] denies patient having any chest pain, edema or shortness of breath. Wife also reports patient weight is stable at 167 lbs.  Discussed with patient's wife the importance of increased exercise and activity for patient.  Discussed need for a cane to help with ambulation and to participate in ADLs and IADLs safely.  Will send request to PCP for order of a cane  to help with ambulation now that patient has moved into permanent housing.   Wife was reluctant to have PCP order DME while they resided in a hotel but now that they have moved into permanent housing she would like DME to be ordered and delivered to home. Patient currently has all medications per patient's wife.    Diabetes:  (Status: Goal on Track (progressing): YES.) Long Term Goal  Lab Results  Component Value Date   HGBA1C 7.1 (H) 09/05/2021    Assessed patient's understanding of A1c goal: <7% Provided education to patient about basic DM disease process; Reviewed medications with patient and discussed importance of medication adherence;        Discussed plans with patient for ongoing care management follow up and provided patient with direct contact information for care management team;      Reviewed scheduled/upcoming provider appointments including: see above in [place];  Referral made to pharmacy team for assistance with complex medication regimen; [facility] Pharmacist involved in care;       Referral made to social work team for assistance with housing and utility payment assistance, dental care providers covered by Managed Medicaid; [facility] BSW involved in care;      Assessed social determinant of health barriers;        Patient has moved to a new home with wife recently.  Discussed need for a glucometer now that patient has moved to new residence form hotel.   Patient currently has all medication per patient's wife.  [name] denies any episodes or any signs/symptoms of Hypoglycemia or Hyperglycemia.   Seizures  (Status: Goal on Track (progressing): YES.) Long Term Goal  Evaluation of current treatment plan related to  Seizures ,  and any seizure activity,  self-management and patient's adherence to plan as established by provider. Discussed plans with patient for ongoing care management follow up and provided patient with direct contact information for care management  team Advised patient to report any seizure activity to PCP; Reviewed medications with patient and discussed importance of medication compliance; Reviewed scheduled/upcoming provider appointments including : see above in [place]; Social Work referral for [employer], Scientist, product/process development and dental care providers; Pharmacy referral for complex medication regimen; Assessed social determinant of health barriers;  [name] reports no recent seizures.  Patient currently has all medications per patient's wife.  Hypertension: (Status: Goal on Track (progressing): YES.) Long Term Goal  Last practice recorded BP readings:  BP Readings from Last 3 Encounters:  09/05/21 128/78  08/28/21 132/78  07/11/21 128/74      Most recent eGFR/CrCl:  Lab Results  Component Value Date   EGFR 92 06/06/2021    No components found for: CRCL  Evaluation of current treatment plan related to hypertension self management and patient's adherence to plan as established by provider;   Reviewed prescribed diet Low Salt, Heart Healthy Reviewed medications with patient and discussed importance of compliance;  Provided assistance with obtaining home blood pressure monitor via contacting  [place] to follow up on Blood Pressure monitor ordered last month.  [facility] staff confirmed that blood pressure monitor is ready.  I requested they deliver the monitor to patient's new home.  [facility] will deliver the monitor.;  Counseled on the importance of exercise goals with target of 150 minutes per week Discussed plans with patient for ongoing care management follow up and provided patient with direct contact information for care management team; Reviewed scheduled/upcoming provider appointments including:  Assessed social determinant of health barriers;   Patient Goals/Self-Care Activities: Take medications as prescribed   Attend all scheduled provider appointments Call pharmacy for medication  refills 3-7 days in advance of running out of medications Call provider office for new concerns or questions  Work with the social worker to address care coordination needs and will continue to work with the clinical team to address health care and disease management related needs

## 2021-12-07 DIAGNOSIS — R4189 Other symptoms and signs involving cognitive functions and awareness: Secondary | ICD-10-CM | POA: Diagnosis not present

## 2021-12-08 DIAGNOSIS — R4189 Other symptoms and signs involving cognitive functions and awareness: Secondary | ICD-10-CM | POA: Diagnosis not present

## 2021-12-11 DIAGNOSIS — R4189 Other symptoms and signs involving cognitive functions and awareness: Secondary | ICD-10-CM | POA: Diagnosis not present

## 2021-12-12 DIAGNOSIS — R4189 Other symptoms and signs involving cognitive functions and awareness: Secondary | ICD-10-CM | POA: Diagnosis not present

## 2021-12-14 DIAGNOSIS — R4189 Other symptoms and signs involving cognitive functions and awareness: Secondary | ICD-10-CM | POA: Diagnosis not present

## 2021-12-15 DIAGNOSIS — R4189 Other symptoms and signs involving cognitive functions and awareness: Secondary | ICD-10-CM | POA: Diagnosis not present

## 2021-12-18 ENCOUNTER — Ambulatory Visit: Payer: Self-pay

## 2021-12-19 ENCOUNTER — Other Ambulatory Visit: Payer: Medicaid Other

## 2021-12-20 DIAGNOSIS — R4189 Other symptoms and signs involving cognitive functions and awareness: Secondary | ICD-10-CM | POA: Diagnosis not present

## 2021-12-21 ENCOUNTER — Ambulatory Visit (INDEPENDENT_AMBULATORY_CARE_PROVIDER_SITE_OTHER): Payer: Medicaid Other | Admitting: Student

## 2021-12-21 VITALS — BP 103/67 | HR 71 | Temp 97.9°F | Wt 156.3 lb

## 2021-12-21 DIAGNOSIS — R569 Unspecified convulsions: Secondary | ICD-10-CM | POA: Diagnosis not present

## 2021-12-21 DIAGNOSIS — Z1211 Encounter for screening for malignant neoplasm of colon: Secondary | ICD-10-CM | POA: Insufficient documentation

## 2021-12-21 DIAGNOSIS — I110 Hypertensive heart disease with heart failure: Secondary | ICD-10-CM | POA: Diagnosis not present

## 2021-12-21 DIAGNOSIS — E559 Vitamin D deficiency, unspecified: Secondary | ICD-10-CM

## 2021-12-21 DIAGNOSIS — Z7984 Long term (current) use of oral hypoglycemic drugs: Secondary | ICD-10-CM | POA: Diagnosis not present

## 2021-12-21 DIAGNOSIS — E785 Hyperlipidemia, unspecified: Secondary | ICD-10-CM

## 2021-12-21 DIAGNOSIS — I10 Essential (primary) hypertension: Secondary | ICD-10-CM

## 2021-12-21 DIAGNOSIS — R4189 Other symptoms and signs involving cognitive functions and awareness: Secondary | ICD-10-CM | POA: Diagnosis not present

## 2021-12-21 DIAGNOSIS — I5020 Unspecified systolic (congestive) heart failure: Secondary | ICD-10-CM | POA: Diagnosis not present

## 2021-12-21 DIAGNOSIS — E119 Type 2 diabetes mellitus without complications: Secondary | ICD-10-CM | POA: Diagnosis not present

## 2021-12-21 DIAGNOSIS — F17210 Nicotine dependence, cigarettes, uncomplicated: Secondary | ICD-10-CM | POA: Diagnosis not present

## 2021-12-21 LAB — POCT GLYCOSYLATED HEMOGLOBIN (HGB A1C): Hemoglobin A1C: 5.9 % — AB (ref 4.0–5.6)

## 2021-12-21 LAB — GLUCOSE, CAPILLARY: Glucose-Capillary: 96 mg/dL (ref 70–99)

## 2021-12-21 MED ORDER — LACOSAMIDE 200 MG PO TABS: 200.0000 mg | ORAL_TABLET | Freq: Two times a day (BID) | ORAL | 3 refills | Status: DC

## 2021-12-21 MED ORDER — ATORVASTATIN CALCIUM 40 MG PO TABS
40.0000 mg | ORAL_TABLET | Freq: Every day | ORAL | 3 refills | Status: DC
  Filled 2021-12-21 – 2022-03-05 (×2): qty 90, 90d supply, fill #0

## 2021-12-21 MED ORDER — AMLODIPINE BESYLATE 10 MG PO TABS
10.0000 mg | ORAL_TABLET | Freq: Every day | ORAL | 3 refills | Status: DC
  Filled 2021-12-21 – 2022-03-05 (×2): qty 90, 90d supply, fill #0

## 2021-12-21 NOTE — Progress Notes (Addendum)
 CC: Diabetes and hyperlipidemia follow-up  HPI:  [name] is a 61 y.o. male with a past medical history of diabetes, and hypertension and hyperlipidemia who presents for diabetes and hyperlipidemia follow-up.  Please see assessment and plan for full HPI.  Past Medical History:  Diagnosis Date   DM2 (diabetes mellitus, type 2) (HCC)    ETOH abuse    Hypertension    Seizures (HCC)    Transaminitis      Current Outpatient Medications:    amLODipine (NORVASC) 10 MG tablet, Take 1 tablet (10 mg total) by mouth daily., Disp: 90 tablet, Rfl: 3   aspirin EC 81 MG tablet, Take 1 tablet (81 mg total) by mouth daily. Swallow whole., Disp: 90 tablet, Rfl: 3   atorvastatin (LIPITOR) 40 MG tablet, Take 1 tablet (40 mg total) by mouth daily., Disp: 90 tablet, Rfl: 3   Blood Pressure Monitor KIT, Use to check blood pressure dailly., Disp: 1 kit, Rfl: 0   cholecalciferol (VITAMIN D) 25 MCG (1000 UNIT) tablet, Take by mouth., Disp: 90 tablet, Rfl: 3   hydrALAZINE (APRESOLINE) 50 MG tablet, Take 1 tablet (50 mg total) by mouth every 8 (eight) hours., Disp: 270 tablet, Rfl: 3   isosorbide dinitrate (ISORDIL) 30 MG tablet, Take 1 tablet (30 mg total) by mouth 3 (three) times daily., Disp: 270 tablet, Rfl: 3   lacosamide (VIMPAT) 200 MG TABS tablet, Take 1 tablet (200 mg total) by mouth 2 (two) times daily., Disp: 180 tablet, Rfl: 3   levETIRAcetam (KEPPRA) 750 MG tablet, Take 2 tablets (1,500 mg total) by mouth 2 (two) times daily., Disp: 180 tablet, Rfl: 3   metFORMIN (GLUCOPHAGE) 500 MG tablet, Take 2 tablets (1,000 mg total) by mouth 2 (two) times daily with a meal., Disp: 360 tablet, Rfl: 1   metoprolol tartrate (LOPRESSOR) 50 MG tablet, Take 1 tablet (50 mg total) by mouth 2 (two) times daily., Disp: 180 tablet, Rfl: 3   sacubitril-valsartan (ENTRESTO) 49-51 MG, Take 1 tablet by mouth 2 (two) times daily. Start on 06/29/2021, Disp: 180 tablet, Rfl: 3  Review of Systems:    Respiratory:  Patient denies any shortness of breath Cardiovascular: Patient denies any chest pain Neuro: Patient denies any seizures   Physical Exam:  Vitals:   12/21/21 1549  BP: 103/67  Pulse: 71  Temp: 97.9 F (36.6 C)  TempSrc: Oral  SpO2: 100%  Weight: 156 lb 4.8 oz (70.9 kg)    General: Alert and orientated x3. Patient is sitting comfortably in the room  Eyes:EOM intact  Head: Normocephalic, atraumatic  Cardio: Regular rate and rhythm, no murmurs, rubs or gallops. 2+ pulses to bilateral upper and lower extremities  Pulmonary: Expiratory wheezing noted to bilateral lung bases Neuro: Sensation intact to bilateral lower extremities.  Extremities: No wounds noted to bilateral lower extremities on the dorsal and plantar surfaces   Assessment & Plan:   Essential hypertension Patient presents to the clinic with blood pressure of 103/67.  Patient reports compliance to his blood pressure regimen.  Patient takes amlodipine 10 mg daily, hydralazine 50 mg every 8 hours, isosorbide dinitrate 30 mg 3 times daily, and metoprolol tartrate 50 mg twice daily.  Patient denies any chest pain, shortness of breath, headaches, vision changes, or any other complaints.  Patient reports that he needs a refill on his amlodipine.  Plan: - Refill for amlodipine sent - Continue regimen of amlodipine 10 mg daily, metoprolol tartrate 50 mg twice daily, isosorbide dinitrate 30 mg   3 times daily, hydralazine 50 mg 3 times daily  Type 2 diabetes mellitus ([place]) Patient presents to the clinic for A1c check.  Patient's most recent A1c on 09/05/2021 was 7.1.  Patient denies any polydipsia or polyuria.  His current regimen includes metformin 1000 mg twice daily.  He is compliant with his regimen.  He denies any hypoglycemic episodes.  It was noted that on his most recent CMP on 09/05/21, patient had an elevated creatinine of 1.29 with GFR of 64.  I want to see how his creatinine looks now.  Plan: - A1c today is 5.9 this is  at goal - Continue metformin 1000 mg twice daily - CMP pending -Foot exam updated today - Patient has ophthalmology appointment scheduled for September for dilated eye exam -Given the patient is at goal he can follow-up in 3 months for his A1c  Screening for colon cancer Patient is due for a colonoscopy.  He is agreeable for colonoscopy referral.  Plan: -Patient is referred to GI for screening colonoscopy  Vitamin D deficiency Patient's last vitamin D level on 09/05/2021 was 13.6.  Patient was started on vitamin D supplements 1000 units daily.  Plan: - Repeat vitamin D levels today  Seizures ([place]) Patient with a history of seizures and is managed with vimpat and Keppra. Patient has a history of seizures and it has left him with some speech delay. He is following with speech therapy and with neurology.  Plan: - Continue Vimpat 200 mg twice daily and Keppra 1000 mg twice daily     HFrEF (heart failure with reduced ejection fraction) ([place]) Patient had an echo on 02/2021 showing EF of 40 to 45%.  Patient is euvolemic on exam.  Patient is currently on Entresto 49-51 mg twice daily, metoprolol tartrate 50 mg twice daily.  Patient denies any shortness of breath today.  No crackles or rales heard on lung exam.  Patient is scheduled for repeat echo on 01/17/2022.  Plan: - Await echo results -Continue Entresto, metoprolol  Hyperlipidemia Patient's most recent lipid panel on 03/23/2021 showed total cholesterol of 147, triglyceride of 127, HDL 36, and LDL 46.  Patient is requesting refill of his atorvastatin 40 mg daily today.  Given that his lipid panel has been good, no need to repeat today.  Plan: - Continue atorvastatin 40 mg daily   Patient seen with Dr.  Wyn Forster, DO PGY-1 Internal Medicine Resident  Pager: [phone]

## 2021-12-21 NOTE — Assessment & Plan Note (Signed)
Patient with a history of seizures and is managed with vimpat and Keppra. Patient has a history of seizures and it has left him with some speech delay. He is following with speech therapy and with neurology.  Plan: - Continue Vimpat 200 mg twice daily and Keppra 1000 mg twice daily

## 2021-12-21 NOTE — Assessment & Plan Note (Signed)
Patient is due for a colonoscopy.  He is agreeable for colonoscopy referral.  Plan: -Patient is referred to GI for screening colonoscopy

## 2021-12-21 NOTE — Assessment & Plan Note (Signed)
Patient's last vitamin D level on 09/05/2021 was 13.6.  Patient was started on vitamin D supplements 1000 units daily.  Plan: - Repeat vitamin D levels today

## 2021-12-21 NOTE — Assessment & Plan Note (Signed)
Patient presents to the clinic with blood pressure of 103/67.  Patient reports compliance to his blood pressure regimen.  Patient takes amlodipine 10 mg daily, hydralazine 50 mg every 8 hours, isosorbide dinitrate 30 mg 3 times daily, and metoprolol tartrate 50 mg twice daily.  Patient denies any chest pain, shortness of breath, headaches, vision changes, or any other complaints.  Patient reports that he needs a refill on his amlodipine.  Plan: - Refill for amlodipine sent - Continue regimen of amlodipine 10 mg daily, metoprolol tartrate 50 mg twice daily, isosorbide dinitrate 30 mg 3 times daily, hydralazine 50 mg 3 times daily

## 2021-12-21 NOTE — Assessment & Plan Note (Signed)
Patient's most recent lipid panel on 03/23/2021 showed total cholesterol of 147, triglyceride of 127, HDL 36, and LDL 46.  Patient is requesting refill of his atorvastatin 40 mg daily today.  Given that his lipid panel has been good, no need to repeat today.  Plan: - Continue atorvastatin 40 mg daily

## 2021-12-21 NOTE — Assessment & Plan Note (Addendum)
Patient presents to the clinic for A1c check.  Patient's most recent A1c on 09/05/2021 was 7.1.  Patient denies any polydipsia or polyuria.  His current regimen includes metformin 1000 mg twice daily.  He is compliant with his regimen.  He denies any hypoglycemic episodes.  It was noted that on his most recent CMP on 09/05/21, patient had an elevated creatinine of 1.29 with GFR of 64.  I want to see how his creatinine looks now.  Plan: - A1c today is 5.9 this is at goal - Continue metformin 1000 mg twice daily - CMP pending -Foot exam updated today - Patient has ophthalmology appointment scheduled for September for dilated eye exam -Given the patient is at goal he can follow-up in 3 months for his A1c

## 2021-12-21 NOTE — Assessment & Plan Note (Signed)
Patient had an echo on 02/2021 showing EF of 40 to 45%.  Patient is euvolemic on exam.  Patient is currently on Entresto 49-51 mg twice daily, metoprolol tartrate 50 mg twice daily.  Patient denies any shortness of breath today.  No crackles or rales heard on lung exam.  Patient is scheduled for repeat echo on 01/17/2022.  Plan: - Await echo results -Continue Entresto, metoprolol

## 2021-12-21 NOTE — Patient Instructions (Addendum)
Arthur [name],Thank you for allowing me to take part in your care today.  Here are your instructions.  1.  Regarding your diabetes.  Your A1c results as 5.9.  We will continue to do metformin 1000 mg twice daily.  He can follow-up in 3 months for repeat A1c.  2.  Regarding her hyperlipidemia, I have refilled your atorvastatin 40 mg daily.  3.  I also refilled your amlodipine and your Vimpat.  Please pick it up from the pharmacy.  4.  The atorvastatin and amlodipine are at the [facility].  And the Vimpat is at the [facility] outpatient pharmacy.  5.  I have pulled some lab work today and will get back to you for the results.  The lab work is for your kidney function and vitamin D level.    Thank you, Dr. Allena Katz  If you have any other questions please contact the internal medicine clinic at [phone]

## 2021-12-22 ENCOUNTER — Other Ambulatory Visit (HOSPITAL_COMMUNITY): Payer: Self-pay

## 2021-12-22 ENCOUNTER — Telehealth: Payer: Self-pay | Admitting: Student

## 2021-12-22 ENCOUNTER — Other Ambulatory Visit: Payer: Self-pay

## 2021-12-22 LAB — BMP8+ANION GAP
Anion Gap: 16 mmol/L (ref 10.0–18.0)
BUN/Creatinine Ratio: 13 (ref 9–20)
BUN: 15 mg/dL (ref 6–24)
CO2: 20 mmol/L (ref 20–29)
Calcium: 8.9 mg/dL (ref 8.7–10.2)
Chloride: 100 mmol/L (ref 96–106)
Creatinine, Ser: 1.18 mg/dL (ref 0.76–1.27)
Glucose: 86 mg/dL (ref 70–99)
Potassium: 4.6 mmol/L (ref 3.5–5.2)
Sodium: 136 mmol/L (ref 134–144)
eGFR: 72 mL/min/{1.73_m2} (ref >59)

## 2021-12-22 LAB — VITAMIN D 25 HYDROXY (VIT D DEFICIENCY, FRACTURES): Vit D, 25-Hydroxy: 35 ng/mL (ref 30.0–100.0)

## 2021-12-22 MED ORDER — LACOSAMIDE 200 MG PO TABS
200.0000 mg | ORAL_TABLET | Freq: Two times a day (BID) | ORAL | 3 refills | Status: DC
  Filled 2021-12-22: qty 180, 90d supply, fill #0

## 2021-12-22 NOTE — Addendum Note (Signed)
Addended by: Belva Agee on: 12/22/2021 08:44 AM   Modules accepted: Orders

## 2021-12-22 NOTE — Telephone Encounter (Signed)
I called the patient informing him of his lab results.  Vitamin D levels look like they are trending up.  I informed patient to continue to take supplements.  Patient's last kidney function showed an elevated creatinine of 1.29, so I repeated BMP yesterday.  Kidney function looks like it is back to normal.  Patient is understanding about these results.

## 2021-12-25 DIAGNOSIS — R4189 Other symptoms and signs involving cognitive functions and awareness: Secondary | ICD-10-CM | POA: Diagnosis not present

## 2021-12-27 DIAGNOSIS — R4189 Other symptoms and signs involving cognitive functions and awareness: Secondary | ICD-10-CM | POA: Diagnosis not present

## 2022-01-02 DIAGNOSIS — R4189 Other symptoms and signs involving cognitive functions and awareness: Secondary | ICD-10-CM | POA: Diagnosis not present

## 2022-01-03 DIAGNOSIS — R4189 Other symptoms and signs involving cognitive functions and awareness: Secondary | ICD-10-CM | POA: Diagnosis not present

## 2022-01-04 DIAGNOSIS — R4189 Other symptoms and signs involving cognitive functions and awareness: Secondary | ICD-10-CM | POA: Diagnosis not present

## 2022-01-05 ENCOUNTER — Ambulatory Visit: Payer: Medicaid Other | Admitting: Cardiology

## 2022-01-05 DIAGNOSIS — R4189 Other symptoms and signs involving cognitive functions and awareness: Secondary | ICD-10-CM | POA: Diagnosis not present

## 2022-01-08 ENCOUNTER — Other Ambulatory Visit: Payer: Self-pay | Admitting: Obstetrics and Gynecology

## 2022-01-08 DIAGNOSIS — R4189 Other symptoms and signs involving cognitive functions and awareness: Secondary | ICD-10-CM | POA: Diagnosis not present

## 2022-01-08 NOTE — Patient Outreach (Signed)
Medicaid Managed Care   Nurse Care Manager Note  01/08/2022 Name:  Arthur [name] MRN:  [id number] DOB:  09/10/1960  Arthur [name] is an 60 y.o. year old male who is a primary patient of Patel, Amar, DO.  The Medicaid Managed Care Coordination team was consulted for assistance with:    Chronic healthcare management needs, HTN, DM, seizures, tobacco use, CAD, HLD  Arthur [name] / Arthur [name], DPR, patient's wife, was given information about Medicaid Managed Care Coordination team services today. Arthur [name] / Arthur [name], DPR, patient's wife,  agreed to services and verbal consent obtained.  Engaged with patient , DPR by telephone for follow up visit in response to provider referral for case management and/or care coordination services.   Assessments/Interventions:  Review of past medical history, allergies, medications, health status, including review of consultants reports, laboratory and other test data, was performed as part of comprehensive evaluation and provision of chronic care management services.  SDOH (Social Determinants of Health) assessments and interventions performed: SDOH Interventions    Flowsheet Row Most Recent Value  SDOH Interventions   Financial Strain Interventions Other (Comment)  [referrals placed]  Stress Interventions Intervention Not Indicated     Care Plan  Allergies  Allergen Reactions   Penicillins Hives    Did it involve swelling of the face/tongue/throat, SOB, or low BP? Y Did it involve sudden or severe rash/hives, skin peeling, or any reaction on the inside of your mouth or nose? N Did you need to seek medical attention at a hospital or doctor's office? Y When did it last happen?  Over 5 Years Ago     If all above answers are "NO", may proceed with cephalosporin use.     Medications Reviewed Today     Reviewed by Craft, Terri G, RN (Registered Nurse) on 01/08/22 at 1550  Med List Status: <None>   Medication Order Taking? Sig Documenting Provider  Last Dose Status Informant  amLODipine (NORVASC) 10 MG tablet [id number]  Take 1 tablet (10 mg total) by mouth daily. Katsadouros, Vasilios, MD  Active   aspirin EC 81 MG tablet [id number] No Take 1 tablet (81 mg total) by mouth daily. Swallow whole.  Patient not taking: Reported on 01/08/2022   [name], [name], PA-C Not Taking Active   atorvastatin (LIPITOR) 40 MG tablet [id number]  Take 1 tablet (40 mg total) by mouth daily. Katsadouros, Vasilios, MD  Active   Blood Pressure Monitor KIT [id number] No Use to check blood pressure dailly.  Patient not taking: Reported on 01/08/2022   Patel, Monik, MD Not Taking Active   cholecalciferol (VITAMIN D) 25 MCG (1000 UNIT) tablet [id number] No Take by mouth.  Patient not taking: Reported on 01/08/2022    Not Taking Active   hydrALAZINE (APRESOLINE) 50 MG tablet [id number]  Take 1 tablet (50 mg total) by mouth every 8 (eight) hours. [name], [name], PA-C  Active Spouse/Significant Other  isosorbide dinitrate (ISORDIL) 30 MG tablet [id number]  Take 1 tablet (30 mg total) by mouth 3 (three) times daily. [name], [name], PA-C  Active Spouse/Significant Other  lacosamide (VIMPAT) 200 MG TABS tablet [id number]  Take 1 tablet (200 mg total) by mouth 2 (two) times daily. Katsadouros, Vasilios, MD  Active   levETIRAcetam (KEPPRA) 750 MG tablet [id number]  Take 2 tablets (1,500 mg total) by mouth 2 (two) times daily. Aslam, Sadia, MD  Active   metFORMIN (GLUCOPHAGE) 500 MG tablet [id number]  Take 2 tablets (1,000 mg total) by   mouth 2 (two) times daily with a meal. Aslam, Sadia, MD  Active   metoprolol tartrate (LOPRESSOR) 50 MG tablet [id number]  Take 1 tablet (50 mg total) by mouth 2 (two) times daily. [name], [name], PA-C  Active Spouse/Significant Other  sacubitril-valsartan (ENTRESTO) 49-51 MG [id number]  Take 1 tablet by mouth 2 (two) times daily. Start on 06/29/2021 [name], [name], PA-C  Active   Med List Note (Daly, Kathy N, CPhT 02/24/21 1756): [place]:  "12/08/20 receiving PAP through [employer], approved until 12/08/2022"           Patient Active Problem List   Diagnosis Date Noted   Screening for colon cancer 12/21/2021   Vitamin D deficiency 09/07/2021   Cognitive impairment 09/06/2021   Coronary artery disease    HFrEF (heart failure with reduced ejection fraction) (HCC) 05/10/2021   Encephalopathy acute 03/11/2021   Seizures (HCC) 02/24/2021   Aortic atherosclerosis (HCC) 11/22/2020   History of seizures 11/22/2020   Alcohol use disorder, severe, dependence (HCC) 11/22/2020   Type 2 diabetes mellitus (HCC) 11/22/2020   Anemia 11/22/2020   Hyperlipidemia 11/22/2020   Acute encephalopathy 10/16/2020   Essential hypertension 10/08/2019   History of CVA (cerebrovascular accident) 10/02/2019   Conditions to be addressed/monitored per PCP order:   chronic healthcare management needs, HTN, DM, seizures, tobacco use, h/o CVA , cognitive impairment, HLD  Care Plan : RN Care Manager Plan of Care  Updates made by Craft, Terri G, RN since 01/08/2022 12:00 AM     Problem: Chronic Disease Management and Care Coordination Needs for DMII, HTN, CAD, Seizures   Priority: High     Long-Range Goal: Development of Plan of Care for Chronic Disease Management and Care Coordination Needs (CAD, HTN, DMII, Seizures)   Start Date: 08/08/2021  Expected End Date: 03/07/2022  Priority: High  Note:   Current Barriers:  Knowledge Deficits related to plan of care for management of CAD, HTN, DMII, and Seizures  Care Coordination needs related to Financial constraints related to affordable safe housing and utility payment, Housing barriers, Medication procurement, ADL IADL limitations, Literacy concerns, Cognitive Deficits, Memory Deficits, Inability to perform IADL's independently, and Lacks knowledge of community resource: safe & affordable Section 8 Housing, utility payment assistance and dental care providers covered by Managed Medicaid insurance.   Chronic Disease Management support and education needs related to CAD, HTN, DMII, and Seizures Financial Constraints.  Non-adherence to prescribed medication regimen Difficulty obtaining medications Cognitive Deficits No Advanced Directives in place Falls - Increased Potential for Falls 01/08/22:  No complaints per patient's wife, DPR.  Seen and evaluated by PCP 12/21/21. [name] would like dental resources for patient-will refer.  Not checking BP or BG- A1C=5.9 on 7/27.  No seizures-awaiting GI referral for colonoscopy.    RNCM Clinical Goal(s):  Patient will verbalize understanding of plan for management of CAD, HTN, DMII, and Seizures as evidenced by improved management of these chronic diseases. verbalize basic understanding of CAD, HTN, DMII, and Seizures disease process and self health management plan as evidenced by noted improvement of management of these chronic diseases. take all medications exactly as prescribed and will call provider for medication related questions as evidenced by being compliant with all medications    attend all scheduled medical appointments     demonstrate improved adherence to prescribed treatment plan for CAD, HTN, DMII, and Seizures as evidenced by overall improved management of these chronic diseases continue to work with RN Care Manager and/or Social Worker to address   care management and care coordination needs related to CAD, HTN, DMII, and Seizures as evidenced by adherence to CM Team Scheduled appointments     work with pharmacist to address Financial constraints related to obtaining medications and Medication procurement related to CAD, HTN, DMII, and Seizures as evidenced by review of EMR and patient or pharmacist report    work with Education officer, museum to address Financial constraints related to safe affordable Housing and utility payment , Housing barriers, ADL IADL limitations, Cognitive Deficits, Memory Deficits, Inability to perform IADL's  independently, and Lacks knowledge of community resource: safe and affordable Section 8 Housing, utility payment assistance and dental care providers covered by [employer] insurance related to the management of CAD, HTN, DMII, and Seizures as evidenced by review of EMR and patient or Education officer, museum report     through collaboration with Consulting civil engineer, provider, and care team.   Interventions: Inter-disciplinary care team collaboration (see longitudinal plan of care) Evaluation of current treatment plan related to  self management and patient's adherence to plan as established by provider BSW completed telephone outreach with patient and his wife. They stated they are currently in a hotel but are waiting on paperwork and inspections to be completed on the new house. They will be in the hotel until 08/24/21, but do not know when the new house will be ready to move in. Patient does not have any income right. They are needing assistance with paying the last utility bill from their previous home. BSW informed they can go to DSS and apply for the utility program, however since the services are disconnected due to moving, they may not assist. They provided an email address for the dental resources to be emailed to [email or web address]. No other resources are needed at this time.  09/05/21: BSW completed follow up with patient's wife while patient was at an appointment. She stated they are still in the hotel and patient still does not have any income. They have applied for disability and are waiting for an appeal letter. [name] states they have been in contact with the housing caseworker and she did state that it can take some time.  10/05/21: BSW completed telephone outreach with patient and wife. She stated they moved into their new home on 09/26/21 and they are still unpacking. She states patient is doing really good. She did ask for some resources that will help with household items, BSW will put a list  together and send to patient. 11/16/21: BSW completed telephone outreach with patient and wife. She stated they spoke with Medicaid and they asked him if he needed anything. She states patient still does not have any income and they are still waiting for a decision from disability. She did state that the aide has started coming in the home to assist with patient. No resources are needed at this time.  10/03/2021:  [name] informed this RN Care Manager that patient's disability has been denied.  Patient and wife recently moved from a hotel to a new home in [place].  New Address updated in patient's medical record.  CAD  (Status: Goal on Track (progressing): YES.) Long Term Goal  Assessed understanding of CAD diagnosis Medications reviewed including medications utilized in CAD treatment plan Provided education on importance of blood pressure control in management of CAD; Counseled on the importance of exercise goals with target of 150 minutes per week Reviewed Importance of taking all medications as prescribed Reviewed Importance of attending all scheduled provider appointments Assessed  social determinant of health barriers;  [name] denies patient having any chest pain, edema or shortness of breath. Wife also reports patient weight is stable at 167 lbs.  Discussed with patient's wife the importance of increased exercise and activity for patient.  Discussed need for a cane to help with ambulation and to participate in ADLs and IADLs safely.  Will send request to PCP for order of a cane to help with ambulation now that patient has moved into permanent housing.   Wife was reluctant to have PCP order DME while they resided in a hotel but now that they have moved into permanent housing she would like DME to be ordered and delivered to home. Patient currently has all medications per patient's wife.    Diabetes:  (Status: Goal on Track (progressing): YES.) Long Term Goal  Lab Results   Component Value Date   HGBA1C 7.1 (H) 09/05/2021   HGBA1C=5.9 on 12/21/21 Assessed patient's understanding of A1c goal: <7% Provided education to patient about basic DM disease process; Reviewed medications with patient and discussed importance of medication adherence;        Discussed plans with patient for ongoing care management follow up and provided patient with direct contact information for care management team;      Reviewed scheduled/upcoming provider appointments including: see above in [place];         Referral made to pharmacy team for assistance with complex medication regimen; [facility] Pharmacist involved in care;       Referral made to social work team for assistance with housing and utility payment assistance, dental care providers covered by Managed Medicaid; [facility] BSW involved in care;      Assessed social determinant of health barriers;        Patient has moved to a new home with wife recently.  Discussed need for a glucometer now that patient has moved to new residence form hotel.   Patient currently has all medication per patient's wife.  [name] denies any episodes or any signs/symptoms of Hypoglycemia or Hyperglycemia.   Seizures  (Status: Goal on Track (progressing): YES.) Long Term Goal  Evaluation of current treatment plan related to  Seizures ,  and any seizure activity,  self-management and patient's adherence to plan as established by provider. Discussed plans with patient for ongoing care management follow up and provided patient with direct contact information for care management team Advised patient to report any seizure activity to PCP; Reviewed medications with patient and discussed importance of medication compliance; Reviewed scheduled/upcoming provider appointments including : see above in [place]; Social Work referral for [employer], Scientist, product/process development and dental care providers; Pharmacy referral for complex medication regimen; Assessed  social determinant of health barriers;  [name] reports no recent seizures.  Patient currently has all medications per patient's wife.  Hypertension: (Status: Goal on Track (progressing): YES.) Long Term Goal  Last practice recorded BP readings:  BP Readings from Last 3 Encounters:  09/05/21 128/78  08/28/21 132/78  07/11/21 128/74      Most recent eGFR/CrCl:  Lab Results  Component Value Date   EGFR 92 06/06/2021    No components found for: CRCL  Evaluation of current treatment plan related to hypertension self management and patient's adherence to plan as established by provider;   Reviewed prescribed diet Low Salt, Heart Healthy Reviewed medications with patient and discussed importance of compliance;  Provided assistance with obtaining home blood pressure monitor via contacting  [place] to  follow up on Blood Pressure monitor ordered last month.  [facility] staff confirmed that blood pressure monitor is ready.  I requested they deliver the monitor to patient's new home.  [facility] will deliver the monitor.;  Counseled on the importance of exercise goals with target of 150 minutes per week Discussed plans with patient for ongoing care management follow up and provided patient with direct contact information for care management team; Reviewed scheduled/upcoming provider appointments including:  Assessed social determinant of health barriers;   Patient Goals/Self-Care Activities: Take medications as prescribed   Attend all scheduled provider appointments Call pharmacy for medication refills 3-7 days in advance of running out of medications Call provider office for new concerns or questions  Work with the social worker to address care coordination needs and will continue to work with the clinical team to address health care and disease management related needs   Long-Range Goal: Establish Plan of Care for Chronic Disease Management Needs   Priority: High   Note:   Timeframe:  Long-Range Goal Priority:  High Start Date:    12/05/21                         Expected End Date:  ongoing                     Follow Up Date 02/16/22   - schedule appointment for vaccines needed due to my age or health - schedule recommended health tests - schedule and keep appointment for annual check-up    Why is this important?   Screening tests can find diseases early when they are easier to treat.  Your doctor or nurse will talk with you about which tests are important for you.  Getting shots for common diseases like the flu and shingles will help prevent them.   01/08/22:  Patient recently seen and evaluated by PCP 7/27, has ECHO scheduled for 01/17/22   Follow Up:  Patient/ DPR  agrees to Care Plan and Follow-up.  Plan: The Managed Medicaid care management team will reach out to the patient / DPR again over the next 30 business  days. and The  Designated Party Release (DPR) has been provided with contact information for the Managed Medicaid care management team and has been advised to call with any health related questions or concerns.  Date/time of next scheduled RN care management/care coordination outreach: 02/16/22 at 315. 

## 2022-01-08 NOTE — Patient Instructions (Signed)
Visit Information  Mr. Carnero / [name], [place], patient's wife, was given information about Medicaid Managed Care team care coordination services as a part of their [place] Medicaid benefit. [name] / [name], [place], patient's wife, verbally agreed  to engagement with the [facility] Managed Care team.   If you are experiencing a medical emergency, please call 911 or report to your local emergency department or urgent care.   If you have a non-emergency medical problem during routine business hours, please contact your provider's office and ask to speak with a nurse.   For questions related to your [facility], please call: [phone] or visit the homepage here: [email or web address]  If you would like to schedule transportation through your [facility], please call the following number at least 2 days in advance of your appointment: [phone]   Rides for urgent appointments can also be made after hours by calling Member Services.  Call the [place] at [phone], at any time, 24 hours a day, 7 days a week. If you are in danger or need immediate medical attention call 911.  If you would like help to quit smoking, call 1-800-QUIT-NOW 336 656 6462) OR Espaol: 1-855-Djelo-Ya (5-883-254-9826) o para ms informacin haga clic aqu or Text READY to 200-400 to register via text  Mr. Reaver / Ms. Martinex- following are the goals we discussed in your visit today:   Goals Addressed    Timeframe:  Long-Range Goal Priority:  High Start Date:    12/05/21                         Expected End Date:  ongoing                     Follow Up Date 02/16/22   - schedule appointment for vaccines needed due to my age or health - schedule recommended health tests - schedule and keep appointment for annual check-up    Why is this important?    Screening tests can find diseases early when they are easier to treat.  Your doctor or nurse will talk with you about which tests are important for you.  Getting shots for common diseases like the flu and shingles will help prevent them.   01/08/22:  Patient recently seen and evaluated by PCP 7/27, has ECHO scheduled for 01/17/22  Patient/  DPR verbalizes understanding of instructions and care plan provided today and agrees to view in [place]. Active MyChart status and patient / DPR understanding of how to access instructions and care plan via MyChart confirmed with patient/DPR.      The Managed Medicaid care management team will reach out to the patient / DPR again over the next 30 business  days.  The  [employer] (DPR) has been provided with contact information for the Managed Medicaid care management team and has been advised to call with any health related questions or concerns.   [name] RN, BSN [place] Management Coordinator - Managed Medicaid High Risk [phone]   Following is a copy of your plan of care:  Care Plan : [place] of Care  Updates made by Gayla Medicus, RN since 01/08/2022 12:00 AM     Problem: Chronic Disease Management and Care Coordination Needs for DMII, HTN, CAD, Seizures   Priority: High     Long-Range Goal: Development  of Plan of Care for Chronic Disease Management and Care Coordination Needs (CAD, HTN, DMII, Seizures)   Start Date: 08/08/2021  Expected End Date: 03/07/2022  Priority: High  Note:   Current Barriers:  Knowledge Deficits related to plan of care for management of CAD, HTN, DMII, and Seizures  Care Coordination needs related to Financial constraints related to affordable safe housing and utility payment, Housing barriers, Medication procurement, ADL IADL limitations, Literacy concerns, Cognitive Deficits, Memory Deficits, Inability to perform IADL's independently, and Lacks  knowledge of community resource: safe & affordable Section 8 Housing, utility payment assistance and dental care providers covered by [name].  Chronic Disease Management support and education needs related to CAD, HTN, DMII, and Seizures Financial Constraints.  Non-adherence to prescribed medication regimen Difficulty obtaining medications Cognitive Deficits No Advanced Directives in place Falls - Increased Potential for Falls 01/08/22:  No complaints per patient's wife, DPR.  Seen and evaluated by PCP 12/21/21. [name] would like dental resources for patient-will refer.  Not checking BP or BG- A1C=5.9 on 7/27.  No seizures-awaiting GI referral for colonoscopy.    RNCM Clinical Goal(s):  Patient will verbalize understanding of plan for management of CAD, HTN, DMII, and Seizures as evidenced by improved management of these chronic diseases. verbalize basic understanding of CAD, HTN, DMII, and Seizures disease process and self health management plan as evidenced by noted improvement of management of these chronic diseases. take all medications exactly as prescribed and will call provider for medication related questions as evidenced by being compliant with all medications    attend all scheduled medical appointments     demonstrate improved adherence to prescribed treatment plan for CAD, HTN, DMII, and Seizures as evidenced by overall improved management of these chronic diseases continue to work with Consulting civil engineer and/or Social Worker to address care management and care coordination needs related to CAD, HTN, DMII, and Seizures as evidenced by adherence to CM Team Scheduled appointments     work with pharmacist to address Financial constraints related to obtaining medications and Medication procurement related to CAD, HTN, DMII, and Seizures as evidenced by review of EMR and patient or pharmacist report    work with Education officer, museum to address Financial constraints related to  safe affordable Housing and utility payment , Housing barriers, ADL IADL limitations, Cognitive Deficits, Memory Deficits, Inability to perform IADL's independently, and Lacks knowledge of community resource: safe and affordable Section 8 Housing, utility payment assistance and dental care providers covered by [employer] insurance related to the management of CAD, HTN, DMII, and Seizures as evidenced by review of EMR and patient or Education officer, museum report     through collaboration with Consulting civil engineer, provider, and care team.   Interventions: Inter-disciplinary care team collaboration (see longitudinal plan of care) Evaluation of current treatment plan related to  self management and patient's adherence to plan as established by provider BSW completed telephone outreach with patient and his wife. They stated they are currently in a hotel but are waiting on paperwork and inspections to be completed on the new house. They will be in the hotel until 08/24/21, but do not know when the new house will be ready to move in. Patient does not have any income right. They are needing assistance with paying the last utility bill from their previous home. BSW informed they can go to DSS and apply for the utility program, however since the services are disconnected due to moving, they may not assist. They  provided an email address for the dental resources to be emailed to [email or web address]. No other resources are needed at this time.  09/05/21: BSW completed follow up with patient's wife while patient was at an appointment. She stated they are still in the hotel and patient still does not have any income. They have applied for disability and are waiting for an appeal letter. [name] states they have been in contact with the housing caseworker and she did state that it can take some time.  10/05/21: BSW completed telephone outreach with patient and wife. She stated they moved into their new home on 09/26/21 and they  are still unpacking. She states patient is doing really good. She did ask for some resources that will help with household items, BSW will put a list together and send to patient. 11/16/21: BSW completed telephone outreach with patient and wife. She stated they spoke with Medicaid and they asked him if he needed anything. She states patient still does not have any income and they are still waiting for a decision from disability. She did state that the aide has started coming in the home to assist with patient. No resources are needed at this time.  10/03/2021:  [name] informed this RN Care Manager that patient's disability has been denied.  Patient and wife recently moved from a hotel to a new home in [place].  New Address updated in patient's medical record.  CAD  (Status: Goal on Track (progressing): YES.) Long Term Goal  Assessed understanding of CAD diagnosis Medications reviewed including medications utilized in CAD treatment plan Provided education on importance of blood pressure control in management of CAD; Counseled on the importance of exercise goals with target of 150 minutes per week Reviewed Importance of taking all medications as prescribed Reviewed Importance of attending all scheduled provider appointments Assessed social determinant of health barriers;  [name] denies patient having any chest pain, edema or shortness of breath. Wife also reports patient weight is stable at 167 lbs.  Discussed with patient's wife the importance of increased exercise and activity for patient.  Discussed need for a cane to help with ambulation and to participate in ADLs and IADLs safely.  Will send request to PCP for order of a cane to help with ambulation now that patient has moved into permanent housing.   Wife was reluctant to have PCP order DME while they resided in a hotel but now that they have moved into permanent housing she would like DME to be ordered and delivered to home. Patient  currently has all medications per patient's wife.    Diabetes:  (Status: Goal on Track (progressing): YES.) Long Term Goal  Lab Results  Component Value Date   HGBA1C 7.1 (H) 09/05/2021   HGBA1C=5.9 on 12/21/21 Assessed patient's understanding of A1c goal: <7% Provided education to patient about basic DM disease process; Reviewed medications with patient and discussed importance of medication adherence;        Discussed plans with patient for ongoing care management follow up and provided patient with direct contact information for care management team;      Reviewed scheduled/upcoming provider appointments including: see above in [place];         Referral made to pharmacy team for assistance with complex medication regimen; [facility] Pharmacist involved in care;       Referral made to social work team for assistance with housing and utility payment assistance, dental care providers covered by Managed Medicaid; [facility] BSW involved  in care;      Assessed social determinant of health barriers;        Patient has moved to a new home with wife recently.  Discussed need for a glucometer now that patient has moved to new residence form hotel.   Patient currently has all medication per patient's wife.  [name] denies any episodes or any signs/symptoms of Hypoglycemia or Hyperglycemia.   Seizures  (Status: Goal on Track (progressing): YES.) Long Term Goal  Evaluation of current treatment plan related to  Seizures ,  and any seizure activity,  self-management and patient's adherence to plan as established by provider. Discussed plans with patient for ongoing care management follow up and provided patient with direct contact information for care management team Advised patient to report any seizure activity to PCP; Reviewed medications with patient and discussed importance of medication compliance; Reviewed scheduled/upcoming provider appointments including : see above in [place]; Social Work referral for [employer], Scientist, product/process development and dental care providers; Pharmacy referral for complex medication regimen; Assessed social determinant of health barriers;  [name] reports no recent seizures.  Patient currently has all medications per patient's wife.  Hypertension: (Status: Goal on Track (progressing): YES.) Long Term Goal  Last practice recorded BP readings:  BP Readings from Last 3 Encounters:  09/05/21 128/78  08/28/21 132/78  07/11/21 128/74      Most recent eGFR/CrCl:  Lab Results  Component Value Date   EGFR 92 06/06/2021    No components found for: CRCL  Evaluation of current treatment plan related to hypertension self management and patient's adherence to plan as established by provider;   Reviewed prescribed diet Low Salt, Heart Healthy Reviewed medications with patient and discussed importance of compliance;  Provided assistance with obtaining home blood pressure monitor via contacting  [place] to follow up on Blood Pressure monitor ordered last month.  [facility] staff confirmed that blood pressure monitor is ready.  I requested they deliver the monitor to patient's new home.  [facility] will deliver the monitor.;  Counseled on the importance of exercise goals with target of 150 minutes per week Discussed plans with patient for ongoing care management follow up and provided patient with direct contact information for care management team; Reviewed scheduled/upcoming provider appointments including:  Assessed social determinant of health barriers;   Patient Goals/Self-Care Activities: Take medications as prescribed   Attend all scheduled provider appointments Call pharmacy for medication refills 3-7 days in advance of running out of medications Call provider office for new concerns or questions  Work with the social worker to address care coordination needs and will continue to work with the clinical team to address health  care and disease management related needs

## 2022-01-09 ENCOUNTER — Other Ambulatory Visit: Payer: Self-pay

## 2022-01-09 DIAGNOSIS — R4189 Other symptoms and signs involving cognitive functions and awareness: Secondary | ICD-10-CM | POA: Diagnosis not present

## 2022-01-09 NOTE — Patient Instructions (Signed)
Visit Information  Mr. Mendonca was given information about Medicaid Managed Care team care coordination services as a part of their [facility] Community Plan Medicaid benefit. [name] verbally consented to engagement with the [facility] Managed Care team.   If you are experiencing a medical emergency, please call 911 or report to your local emergency department or urgent care.   If you have a non-emergency medical problem during routine business hours, please contact your provider's office and ask to speak with a nurse.   For questions related to your [facility], please call: [phone] or visit the homepage here: [email or web address]  If you would like to schedule transportation through your [facility], please call the following number at least 2 days in advance of your appointment: [phone]   Rides for urgent appointments can also be made after hours by calling Member Services.  Call the Behavioral Health Crisis Line at [phone], at any time, 24 hours a day, 7 days a week. If you are in danger or need immediate medical attention call 911.  If you would like help to quit smoking, call 1-800-QUIT-NOW (347-236-0178) OR Espaol: 1-855-Djelo-Ya (9-702-637-8588) o para ms informacin haga clic aqu or Text READY to 502-774 to register via text  Mr. Heesch - following are the goals we discussed in your visit today:   Goals Addressed   None       Social Worker will follow up in 30 days .   Gus Puma, BSW, [place] [facility]  [place]  High Risk Managed Medicaid Team  [phone]   Following is a copy of your plan of care:  There are no care plans that you recently modified to display for this patient.

## 2022-01-09 NOTE — Patient Outreach (Signed)
Medicaid Managed Care Social Work Note  01/09/2022 Name:  Arthur [name] MRN:  [id number] DOB:  Dec 06, 1960  Arthur [name] is an 61 y.o. year old male who is a primary patient of [name], DO.  The [facility] Managed Care Coordination team was consulted for assistance with:   Dental Resources  Mr. Fant was given information about Medicaid Managed Care Coordination team services today. [name] Primary Caregiver agreed to services and verbal consent obtained.  Engaged with patient  for by telephone forfollow up visit in response to referral for case management and/or care coordination services.   Assessments/Interventions:  Review of past medical history, allergies, medications, health status, including review of consultants reports, laboratory and other test data, was performed as part of comprehensive evaluation and provision of chronic care management services.  SDOH: (Social Determinant of Health) assessments and interventions performed: BSW completed a telephone outreach with patient and wife. She stated she has misplaced the dental resources provided before. BSW informed she did email them to [email or web address] last time, and she asked for resources to be resent to that email, BSW resent email. No other resources are needed at this time.   Advanced Directives Status:  Not addressed in this encounter.  Care Plan                 Allergies  Allergen Reactions   Penicillins Hives    Did it involve swelling of the face/tongue/throat, SOB, or low BP? Y Did it involve sudden or severe rash/hives, skin peeling, or any reaction on the inside of your mouth or nose? N Did you need to seek medical attention at a hospital or doctor's office? Y When did it last happen?  Over 5 Years Ago     If all above answers are "NO", may proceed with cephalosporin use.     Medications Reviewed Today     Reviewed by Gayla Medicus, RN (Registered Nurse) on 01/08/22 at [place] List Status: <None>    Medication Order Taking? Sig Documenting Provider Last Dose Status Informant  amLODipine (NORVASC) 10 MG tablet [id number]  Take 1 tablet (10 mg total) by mouth daily. Riesa Pope, MD  Active   aspirin EC 81 MG tablet [id number] No Take 1 tablet (81 mg total) by mouth daily. Swallow whole.  Patient not taking: Reported on 01/08/2022   Alethia Berthold, PA-C Not Taking Active   atorvastatin (LIPITOR) 40 MG tablet [id number]  Take 1 tablet (40 mg total) by mouth daily. Riesa Pope, MD  Active   Blood Pressure Monitor KIT [id number] No Use to check blood pressure dailly.  Patient not taking: Reported on 01/08/2022   [name], MD Not Taking Active   cholecalciferol (VITAMIN D) 25 MCG (1000 UNIT) tablet [id number] No Take by mouth.  Patient not taking: Reported on 01/08/2022    Not Taking Active   hydrALAZINE (APRESOLINE) 50 MG tablet [id number]  Take 1 tablet (50 mg total) by mouth every 8 (eight) hours. [name], [name], PA-C  Active Spouse/Significant Other  isosorbide dinitrate (ISORDIL) 30 MG tablet [id number]  Take 1 tablet (30 mg total) by mouth 3 (three) times daily. [name], [name], PA-C  Active Spouse/Significant Other  lacosamide (VIMPAT) 200 MG TABS tablet [id number]  Take 1 tablet (200 mg total) by mouth 2 (two) times daily. Riesa Pope, MD  Active   levETIRAcetam (KEPPRA) 750 MG tablet [id number]  Take 2 tablets (1,500 mg total) by mouth 2 (two) times daily. [name],  MD  Active   metFORMIN (GLUCOPHAGE) 500 MG tablet [id number]  Take 2 tablets (1,000 mg total) by mouth 2 (two) times daily with a meal. Aslam, Sadia, MD  Active   metoprolol tartrate (LOPRESSOR) 50 MG tablet [id number]  Take 1 tablet (50 mg total) by mouth 2 (two) times daily. [name], [name], PA-C  Active Spouse/Significant Other  sacubitril-valsartan (ENTRESTO) 49-51 MG [id number]  Take 1 tablet by mouth 2 (two) times daily. Start on 06/29/2021 Verneda Skill  Active   Med  List Note [name], CPhT 02/24/21 1756): [place]: "12/08/20 receiving PAP through [employer], approved until 12/08/2022"            Patient Active Problem List   Diagnosis Date Noted   Screening for colon cancer 12/21/2021   Vitamin D deficiency 09/07/2021   Cognitive impairment 09/06/2021   Coronary artery disease    HFrEF (heart failure with reduced ejection fraction) ([place]) 05/10/2021   Encephalopathy acute 03/11/2021   Seizures ([place]) 02/24/2021   Aortic atherosclerosis ([place]) 11/22/2020   History of seizures 11/22/2020   Alcohol use disorder, severe, dependence ([place]) 11/22/2020   Type 2 diabetes mellitus ([place]) 11/22/2020   Anemia 11/22/2020   Hyperlipidemia 11/22/2020   Acute encephalopathy 10/16/2020   Essential hypertension 10/08/2019   History of CVA (cerebrovascular accident) 10/02/2019    Conditions to be addressed/monitored per PCP order:   dental resources  There are no care plans that you recently modified to display for this patient.   Follow up:  Patient agrees to Care Plan and Follow-up.  Plan: The Managed Medicaid care management team will reach out to the patient again over the next 30 days.  Date/time of next scheduled Social Work care management/care coordination outreach:  02/09/22  [name], Arita Miss, [place] Medicaid Team  [phone]

## 2022-01-10 DIAGNOSIS — R4189 Other symptoms and signs involving cognitive functions and awareness: Secondary | ICD-10-CM | POA: Diagnosis not present

## 2022-01-11 DIAGNOSIS — R4189 Other symptoms and signs involving cognitive functions and awareness: Secondary | ICD-10-CM | POA: Diagnosis not present

## 2022-01-17 ENCOUNTER — Ambulatory Visit: Payer: Medicaid Other

## 2022-01-17 DIAGNOSIS — R4189 Other symptoms and signs involving cognitive functions and awareness: Secondary | ICD-10-CM | POA: Diagnosis not present

## 2022-01-17 DIAGNOSIS — I119 Hypertensive heart disease without heart failure: Secondary | ICD-10-CM

## 2022-01-22 DIAGNOSIS — R4189 Other symptoms and signs involving cognitive functions and awareness: Secondary | ICD-10-CM | POA: Diagnosis not present

## 2022-01-23 ENCOUNTER — Encounter: Payer: Self-pay | Admitting: Diagnostic Neuroimaging

## 2022-01-23 ENCOUNTER — Other Ambulatory Visit: Payer: Self-pay

## 2022-01-23 ENCOUNTER — Ambulatory Visit: Payer: Medicaid Other | Admitting: Diagnostic Neuroimaging

## 2022-01-23 ENCOUNTER — Other Ambulatory Visit (HOSPITAL_COMMUNITY): Payer: Self-pay

## 2022-01-23 VITALS — BP 136/93 | HR 55 | Ht 63.0 in | Wt 146.1 lb

## 2022-01-23 DIAGNOSIS — G40001 Localization-related (focal) (partial) idiopathic epilepsy and epileptic syndromes with seizures of localized onset, not intractable, with status epilepticus: Secondary | ICD-10-CM

## 2022-01-23 DIAGNOSIS — R569 Unspecified convulsions: Secondary | ICD-10-CM

## 2022-01-23 MED ORDER — LACOSAMIDE 200 MG PO TABS
200.0000 mg | ORAL_TABLET | Freq: Two times a day (BID) | ORAL | 4 refills | Status: DC
  Filled 2022-01-23 – 2022-02-05 (×3): qty 180, 90d supply, fill #0

## 2022-01-23 MED ORDER — LEVETIRACETAM 750 MG PO TABS
1500.0000 mg | ORAL_TABLET | Freq: Two times a day (BID) | ORAL | 4 refills | Status: DC
  Filled 2022-01-23 – 2022-02-05 (×3): qty 360, 90d supply, fill #0

## 2022-01-23 NOTE — Progress Notes (Signed)
[facility]  PATIENT: Arthur [name] DOB: May 07, 1961  REFERRING CLINICIAN: [name], MD HISTORY FROM: Patient and wife REASON FOR VISIT: follow up   HISTORICAL  CHIEF COMPLAINT:  Chief Complaint  Patient presents with   Follow-up    Pt is feeling pretty good. He states experiencing some dizziness and some lightheadedness. Room 7 with wife    HISTORY OF PRESENT ILLNESS:   UPDATE (01/23/22, VRP): Since last visit, went to hospital in Sept - Oct 2022 for focal status epilepticus (focal right-sided twitching, right gaze deviation and not following commands). Now on LEV and vimpat and doing well.   PRIOR HPI (01/18/21): 61 year old male here for evaluation of seizure/encephalopathy.  History of alcohol abuse and prior left temporoparietal stroke.  Patient presented to the hospital for new onset of facial droop and slurred speech.  Code stroke was activated.  IV tPA was started and patient began to have shivering and blood in his mouth.  Seizure was suspected.  tPA was stopped and patient was given Ativan 4 mg for possible seizure versus alcohol withdrawal.  Continuous EEG was performed.  Electrographic seizures were noted averaging 3 to 4/h lasting 20 seconds.  These seemed to arise from left frontotemporal region.  Patient was started on antiseizure medication.  He was stabilized and discharged on levetiracetam 500 mg twice a day and Vimpat 50 mg twice a day.  Since discharge patient is doing well and no further events.  Tolerating medications.   REVIEW OF SYSTEMS: Full 14 system review of systems performed and negative with exception of: as per HPI.  ALLERGIES: Allergies  Allergen Reactions   Penicillins Hives    Did it involve swelling of the face/tongue/throat, SOB, or low BP? Y Did it involve sudden or severe rash/hives, skin peeling, or any reaction on the inside of your mouth or nose? N Did you need to seek medical attention at a hospital or doctor's office?  Y When did it last happen?  Over 5 Years Ago     If all above answers are "NO", may proceed with cephalosporin use.     HOME MEDICATIONS: Outpatient Medications Prior to Visit  Medication Sig Dispense Refill   amLODipine (NORVASC) 10 MG tablet Take 1 tablet (10 mg total) by mouth daily. 90 tablet 3   aspirin EC 81 MG tablet Take 1 tablet (81 mg total) by mouth daily. Swallow whole. 90 tablet 3   atorvastatin (LIPITOR) 40 MG tablet Take 1 tablet (40 mg total) by mouth daily. 90 tablet 3   Blood Pressure Monitor KIT Use to check blood pressure dailly. 1 kit 0   cholecalciferol (VITAMIN D) 25 MCG (1000 UNIT) tablet Take by mouth. 90 tablet 3   hydrALAZINE (APRESOLINE) 50 MG tablet Take 1 tablet (50 mg total) by mouth every 8 (eight) hours. 270 tablet 3   isosorbide dinitrate (ISORDIL) 30 MG tablet Take 1 tablet (30 mg total) by mouth 3 (three) times daily. 270 tablet 3   lacosamide (VIMPAT) 200 MG TABS tablet Take 1 tablet (200 mg total) by mouth 2 (two) times daily. 180 tablet 3   levETIRAcetam (KEPPRA) 750 MG tablet Take 2 tablets (1,500 mg total) by mouth 2 (two) times daily. 180 tablet 3   metFORMIN (GLUCOPHAGE) 500 MG tablet Take 2 tablets (1,000 mg total) by mouth 2 (two) times daily with a meal. 360 tablet 1   metoprolol tartrate (LOPRESSOR) 50 MG tablet Take 1 tablet (50 mg total) by mouth 2 (two) times  daily. 180 tablet 3   sacubitril-valsartan (ENTRESTO) 49-51 MG Take 1 tablet by mouth 2 (two) times daily. Start on 06/29/2021 180 tablet 3   No facility-administered medications prior to visit.    PAST MEDICAL HISTORY: Past Medical History:  Diagnosis Date   DM2 (diabetes mellitus, type 2) ([place])    ETOH abuse    Hypertension    Seizures ([place])    Transaminitis     PAST SURGICAL HISTORY: Past Surgical History:  Procedure Laterality Date   HEMORRHOID SURGERY     INTRAVASCULAR PRESSURE WIRE/FFR STUDY N/A 06/27/2021   Procedure: INTRAVASCULAR PRESSURE WIRE/FFR STUDY;  Surgeon:  Nigel Mormon, MD;  Location: [facility];  Service: Cardiovascular;  Laterality: N/A;   LEFT HEART CATH AND CORONARY ANGIOGRAPHY N/A 06/27/2021   Procedure: LEFT HEART CATH AND CORONARY ANGIOGRAPHY;  Surgeon: Nigel Mormon, MD;  Location: [facility];  Service: Cardiovascular;  Laterality: N/A;    FAMILY HISTORY: Family History  Problem Relation Age of Onset   Diabetes Mother    Hypertension Mother    Diabetes Father    Hypertension Father    Hypertension Sister     SOCIAL HISTORY: Social History   Socioeconomic History   Marital status: Married    Spouse name: [name]   Number of children: 6   Years of education: Not on file   Highest education level: Not on file  Occupational History   Not on file  Tobacco Use   Smoking status: Every Day    Types: Cigars   Smokeless tobacco: Never   Tobacco comments:    Smokes black & mild - 3 cigars a day.  Vaping Use   Vaping Use: Never used  Substance and Sexual Activity   Alcohol use: Not Currently   Drug use: Never   Sexual activity: Not on file  Other Topics Concern   Not on file  Social History Narrative   Right handed   Lives at home with wife    2-3 cups of cadd per day    Social Determinants of Health   Financial Resource Strain: Medium Risk (01/08/2022)   Overall Financial Resource Strain (CARDIA)    Difficulty of Paying Living Expenses: Somewhat hard  Food Insecurity: Food Insecurity Present (08/08/2021)   Hunger Vital Sign    Worried About Running Out of Food in the Last Year: Sometimes true    Ran Out of Food in the Last Year: Sometimes true  Transportation Needs: Unmet Transportation Needs (10/19/2021)   PRAPARE - Transportation    Lack of Transportation (Medical): Yes    Lack of Transportation (Non-Medical): Yes  Physical Activity: Inactive (08/08/2021)   Exercise Vital Sign    Days of Exercise per Week: 0 days    Minutes of Exercise per Session: 0 min  Stress: No Stress Concern  Present (01/08/2022)   [place]    Feeling of Stress : Only a little  Social Connections: Socially Integrated (08/08/2021)   Social Connection and Isolation Panel [NHANES]    Frequency of Communication with Friends and Family: More than three times a week    Frequency of Social Gatherings with Friends and Family: More than three times a week    Attends Religious Services: More than 4 times per year    Active Member of [employer] or Organizations: Yes    Attends Music therapist: More than 4 times per year    Marital Status: Married  Intimate Partner Violence: Not At Risk (12/05/2021)   Humiliation, Afraid, Rape, and Kick questionnaire    Fear of Current or Ex-Partner: No    Emotionally Abused: No    Physically Abused: No    Sexually Abused: No     PHYSICAL EXAM  GENERAL EXAM/CONSTITUTIONAL: Vitals:  Vitals:   01/23/22 1030  BP: (!) 136/93  Pulse: (!) 55  Weight: 146 lb 2 oz (66.3 kg)  Height: $Remove'5\' 3"'jeMTLfV$  (1.6 m)   Body mass index is 25.88 kg/m. Wt Readings from Last 3 Encounters:  01/23/22 146 lb 2 oz (66.3 kg)  12/21/21 156 lb 4.8 oz (70.9 kg)  09/05/21 167 lb 6.4 oz (75.9 kg)   Patient is in no distress; well developed, nourished and groomed; neck is supple  CARDIOVASCULAR: Examination of carotid arteries is normal; no carotid bruits Regular rate and rhythm, no murmurs Examination of peripheral vascular system by observation and palpation is normal  EYES: Ophthalmoscopic exam of optic discs and posterior segments is normal; no papilledema or hemorrhages No results found.  MUSCULOSKELETAL: Gait, strength, tone, movements noted in Neurologic exam below  NEUROLOGIC: MENTAL STATUS:      No data to display         awake, alert, oriented to person, place and time recent and remote memory intact normal attention and concentration DECR FLUENCY; comprehension intact, naming intact fund of  knowledge appropriate  CRANIAL NERVE:  2nd - no papilledema on fundoscopic exam 2nd, 3rd, 4th, 6th - pupils equal and reactive to light, visual fields full to confrontation, extraocular muscles intact, no nystagmus 5th - facial sensation symmetric 7th - facial strength symmetric 8th - hearing intact 9th - palate elevates symmetrically, uvula midline 11th - shoulder shrug symmetric 12th - tongue protrusion midline  MOTOR:  normal bulk and tone, full strength in the BUE, BLE  SENSORY:  normal and symmetric to light touch, temperature, vibration  COORDINATION:  finger-nose-finger, fine finger movements normal  REFLEXES:  deep tendon reflexes present and symmetric  GAIT/STATION:  narrow based gait     DIAGNOSTIC DATA (LABS, IMAGING, TESTING) - I reviewed patient records, labs, notes, testing and imaging myself where available.  Lab Results  Component Value Date   WBC 7.7 09/05/2021   HGB 13.9 09/05/2021   HCT 43.7 09/05/2021   MCV 78 (L) 09/05/2021   PLT 268 09/05/2021      Component Value Date/Time   NA 136 12/21/2021 1605   K 4.6 12/21/2021 1605   CL 100 12/21/2021 1605   CO2 20 12/21/2021 1605   GLUCOSE 86 12/21/2021 1605   GLUCOSE 106 (H) 03/24/2021 0505   BUN 15 12/21/2021 1605   CREATININE 1.18 12/21/2021 1605   CALCIUM 8.9 12/21/2021 1605   PROT 7.8 09/05/2021 1202   ALBUMIN 4.7 09/05/2021 1202   AST 21 09/05/2021 1202   ALT 25 09/05/2021 1202   ALKPHOS 114 09/05/2021 1202   BILITOT 0.4 09/05/2021 1202   GFRNONAA >60 03/24/2021 0505   GFRAA 95 01/11/2020 0959   Lab Results  Component Value Date   CHOL 147 03/23/2021   HDL 36 (L) 03/23/2021   LDLCALC 86 03/23/2021   LDLDIRECT 124.3 (H) 10/17/2020   TRIG 127 03/23/2021   CHOLHDL 4.1 03/23/2021   Lab Results  Component Value Date   HGBA1C 5.9 (A) 12/21/2021   Lab Results  Component Value Date   VITAMINB12 269 09/05/2021   Lab Results  Component Value Date   TSH 1.080 09/05/2021  10/17/2020 MRI brain with and without contrast - Abnormal diffusion signal involving the left hippocampus. Primary differential considerations are seizure effect and subacute infarct. - Multiple chronic infarcts.  10/16/2020 EEG - This study is suggestive of severe diffuse encephalopathy, nonspecific etiology but likely related to sedation.  No seizures or epileptiform discharges were seen during the study  10/17/2020 cEEG - This study showed seizures without clinical signs arising from left frontotemporal region, average 3-4/hour, duration about 20 seconds, last seizure at 0908 on 10/17/2020.  Additionally there is evidence of cortical dysfunction and epileptogenicity arising from left frontotemporal region due to underlying stroke.  There is also severe diffuse encephalopathy, nonspecific etiology but likely related to sedation.   10/18/20 cEEG - This study showed evidence of cortical dysfunction and epileptogenicity arising from left frontotemporal region due to underlying stroke.  There is also severe diffuse encephalopathy, nonspecific etiology but likely related to sedation.  No seizures were seen during the study.  02/25/21 overnight vEEG  ABNORMALITY - Focal non convulsive status epilepticus, left posterior quadrant - Brief-ictal-interictal rhythmic discharges ( BIRD) , left posterior quadrant - Lateralized rhythmic delta activity, left hemisphere and maximal left posterior quadrant - Continuous slow, generalized and maximal left posterior quadrant   IMPRESSION: This study initially showed evidence of epileptogenicity and cortical dysfunction arising from left posterior quadrant with brief-ictal-interictal rhythmic discharges. This EEG pattern is on the ictal-interictal continuum with high potential for seizures. Gradually eeg showed evolution in morphology and frequency.  Per review of notes, patient had trouble answering questions. This EEG is consistent with focal non convulsive  status epilepticus.     Additionally, there is also moderate diffuse encephalopathy, nonspecific etiology but likely related to seizure.     ASSESSMENT AND PLAN  61 y.o. year old male here with:  Dx:  1. Seizures ([place])   2. Localization-related idiopathic epilepsy and epileptic syndromes with seizures of localized onset, not intractable, with status epilepticus ([place])     PLAN:  SEIZURE DISORDER (last seizure 02/27/21,  10/16/20; post-stroke 2021; h/o alcohol abuse)  - continue levetiracetam 500mg  twice a day   - continue vimpat 200mg  twice a day  - avoid alcohol  - According to [place] law, you can not drive unless you are seizure / syncope free for at least 6 months and under physician's care.   - Please maintain precautions. Do not participate in activities where a loss of awareness could harm you or someone else. No swimming alone, no tub bathing, no hot tubs, no driving, no operating motorized vehicles (cars, ATVs, motocycles, etc), lawnmowers, power tools or firearms. No standing at heights, such as rooftops, ladders or stairs. Avoid hot objects such as stoves, heaters, open fires. Wear a helmet when riding a bicycle, scooter, skateboard, etc. and avoid areas of traffic. Set your water heater to 120 degrees or less.  Meds ordered this encounter  Medications   levETIRAcetam (KEPPRA) 750 MG tablet    Sig: Take 2 tablets (1,500 mg total) by mouth 2 (two) times daily.    Dispense:  360 tablet    Refill:  4   lacosamide (VIMPAT) 200 MG TABS tablet    Sig: Take 1 tablet (200 mg total) by mouth 2 (two) times daily.    Dispense:  180 tablet    Refill:  4   Return in about 1 year (around 01/24/2023) for MyChart visit (15 min).    Penni Bombard, MD 07/19/9796, 92:11 AM Certified in Neurology, Neurophysiology and Neuroimaging  [facility]  [address], [address], [place] [address] [phone]

## 2022-01-24 ENCOUNTER — Other Ambulatory Visit: Payer: Self-pay

## 2022-01-26 DIAGNOSIS — R4189 Other symptoms and signs involving cognitive functions and awareness: Secondary | ICD-10-CM | POA: Diagnosis not present

## 2022-01-30 DIAGNOSIS — R4189 Other symptoms and signs involving cognitive functions and awareness: Secondary | ICD-10-CM | POA: Diagnosis not present

## 2022-01-31 ENCOUNTER — Other Ambulatory Visit: Payer: Self-pay

## 2022-01-31 DIAGNOSIS — R4189 Other symptoms and signs involving cognitive functions and awareness: Secondary | ICD-10-CM | POA: Diagnosis not present

## 2022-02-01 ENCOUNTER — Ambulatory Visit: Payer: Medicaid Other | Admitting: Cardiology

## 2022-02-05 ENCOUNTER — Other Ambulatory Visit: Payer: Self-pay

## 2022-02-07 DIAGNOSIS — R4189 Other symptoms and signs involving cognitive functions and awareness: Secondary | ICD-10-CM | POA: Diagnosis not present

## 2022-02-08 DIAGNOSIS — R4189 Other symptoms and signs involving cognitive functions and awareness: Secondary | ICD-10-CM | POA: Diagnosis not present

## 2022-02-09 ENCOUNTER — Other Ambulatory Visit: Payer: Self-pay

## 2022-02-09 NOTE — Patient Outreach (Signed)
Medicaid Managed Care Social Work Note  02/09/2022 Name:  Arthur [name] MRN:  [id number] DOB:  07-20-1960  Arthur [name] is an 61 y.o. year old male who is a primary patient of [name], DO.  The [facility] Managed Care Coordination team was consulted for assistance with:  Community Resources   Arthur [name] was given information about Medicaid Managed Care Coordination team services today. [name] Primary Caregiver agreed to services and verbal consent obtained.  Engaged with patient  for by telephone forfollow up visit in response to referral for case management and/or care coordination services.   Assessments/Interventions:  Review of past medical history, allergies, medications, health status, including review of consultants reports, laboratory and other test data, was performed as part of comprehensive evaluation and provision of chronic care management services.  SDOH: (Social Determinant of Health) assessments and interventions performed: SDOH Interventions    Flowsheet Row Patient Outreach Telephone from 01/08/2022 in [place] Patient Outreach Telephone from 10/19/2021 in [place] Patient Outreach Telephone from 08/08/2021 in [place] Management from 03/17/2020 in [place] Management from 11/25/2019 in [place] Interventions -- -- Other (Comment)  [Wife Arthur [name] -- Other (Comment)  [Family receives food stamps]  Housing Interventions -- -- Other (Comment)  [Referral to BSW for housing resources] -- --  Transportation Interventions -- Other (Comment)  [set up delivery for [employer] mach[employer]] Intervention Not Indicated -- --  Museum/gallery curator Strain Interventions Other (Comment)  [referrals placed] Intervention Not Indicated Other (Comment)   [Referral to [facility] BSW and Pharmacist] Other (Comment)  [referral to [employer] to assist with Medicaid application] Other (Comment)  [Will refer patient to [place] after consent signed]  Physical Activity Interventions -- -- Other (Comments)  [Cannot exercise at this time.] -- --  Stress Interventions Intervention Not Indicated -- Intervention Not Indicated -- --  Social Connections Interventions -- -- Intervention Not Indicated -- --     BSW completed a telephone outreach with patients wife. She stated everything is going okay. Patients disability was denied but they did appeal it and now are waiting for a decision. Wife states they are making it with only her and her sons income of disability. Patients PCS hours were increased to 59 hours per month. No resources are needed at this time  Advanced Directives Status:  Not addressed in this encounter.  Care Plan                 Allergies  Allergen Reactions   Penicillins Hives    Did it involve swelling of the face/tongue/throat, SOB, or low BP? Y Did it involve sudden or severe rash/hives, skin peeling, or any reaction on the inside of your mouth or nose? N Did you need to seek medical attention at a hospital or doctor's office? Y When did it last happen?  Over 5 Years Ago     If all above answers are "NO", may proceed with cephalosporin use.     Medications Reviewed Today     Reviewed by Penni Bombard, MD (Physician) on 01/23/22 at 1133  Med List Status: <None>   Medication Order Taking? Sig Documenting Provider Last Dose Status Informant  amLODipine (NORVASC) 10 MG tablet [id number] Yes Take 1 tablet (10 mg total) by mouth daily. Katsadouros,  Vasilios, MD Taking Active   aspirin EC 81 MG tablet [id number] Yes Take 1 tablet (81 mg total) by mouth daily. Swallow whole. [name], [name], PA-C Taking Active   atorvastatin (LIPITOR) 40 MG tablet [id number] Yes Take 1 tablet (40 mg total) by mouth daily.  Riesa Pope, MD Taking Active   Blood Pressure Monitor KIT [id number] Yes Use to check blood pressure dailly. [name], MD Taking Active   cholecalciferol (VITAMIN D) 25 MCG (1000 UNIT) tablet [id number] Yes Take by mouth.  Taking Active   hydrALAZINE (APRESOLINE) 50 MG tablet [id number] Yes Take 1 tablet (50 mg total) by mouth every 8 (eight) hours. [name], [name], PA-C Taking Active Spouse/Significant Other  isosorbide dinitrate (ISORDIL) 30 MG tablet [id number] Yes Take 1 tablet (30 mg total) by mouth 3 (three) times daily. [name], [name], PA-C Taking Active Spouse/Significant Other  lacosamide (VIMPAT) 200 MG TABS tablet [id number]  Take 1 tablet (200 mg total) by mouth 2 (two) times daily. Penumalli, Earlean Polka, MD  Active   levETIRAcetam (KEPPRA) 750 MG tablet [id number]  Take 2 tablets (1,500 mg total) by mouth 2 (two) times daily. Penumalli, Earlean Polka, MD  Active   metFORMIN (GLUCOPHAGE) 500 MG tablet [id number] Yes Take 2 tablets (1,000 mg total) by mouth 2 (two) times daily with a meal. Aslam, Sadia, MD Taking Active   metoprolol tartrate (LOPRESSOR) 50 MG tablet [id number] Yes Take 1 tablet (50 mg total) by mouth 2 (two) times daily. [name], [name], PA-C Taking Active Spouse/Significant Other  sacubitril-valsartan (ENTRESTO) 49-51 MG [id number] Yes Take 1 tablet by mouth 2 (two) times daily. Start on 06/29/2021 Alethia Berthold, PA-C Taking Active   Med List Note [name], CPhT 02/24/21 1756): Vimpat: "12/08/20 receiving PAP through [employer], approved until 12/08/2022"            Patient Active Problem List   Diagnosis Date Noted   Screening for colon cancer 12/21/2021   Vitamin D deficiency 09/07/2021   Cognitive impairment 09/06/2021   Coronary artery disease    HFrEF (heart failure with reduced ejection fraction) ([place]) 05/10/2021   Encephalopathy acute 03/11/2021   Seizures ([place]) 02/24/2021   Aortic atherosclerosis ([place]) 11/22/2020   History of  seizures 11/22/2020   Alcohol use disorder, severe, dependence ([place]) 11/22/2020   Type 2 diabetes mellitus ([place]) 11/22/2020   Anemia 11/22/2020   Hyperlipidemia 11/22/2020   Acute encephalopathy 10/16/2020   Essential hypertension 10/08/2019   History of CVA (cerebrovascular accident) 10/02/2019    Conditions to be addressed/monitored per PCP order:   community resources  There are no care plans that you recently modified to display for this patient.   Follow up:  Patient agrees to Care Plan and Follow-up.  Plan: The  Primary Caregiver has been provided with contact information for the Managed Medicaid care management team and has been advised to call with any health related questions or concerns.  [name], BSW, [place] Managed Medicaid Team  [phone]

## 2022-02-09 NOTE — Patient Instructions (Signed)
Visit Information  Mr. Calame was given information about Medicaid Managed Care team care coordination services as a part of their [employer] Community Plan Medicaid benefit. Dhruvan A Rathgeber verbally consented to engagement with the Medicaid Managed Care team.   If you are experiencing a medical emergency, please call 911 or report to your local emergency department or urgent care.   If you have a non-emergency medical problem during routine business hours, please contact your provider's office and ask to speak with a nurse.   For questions related to your [employer] Community Plan Medicaid, please call: [phone] or visit the homepage here: [email or web address]  If you would like to schedule transportation through your [employer] Community Plan Medicaid, please call the following number at least 2 days in advance of your appointment: [phone]   Rides for urgent appointments can also be made after hours by calling Member Services.  Call the Behavioral Health Crisis Line at 1-877-334-1141, at any time, 24 hours a day, 7 days a week. If you are in danger or need immediate medical attention call 911.  If you would like help to quit smoking, call 1-800-QUIT-NOW (1-800-784-8669) OR Espaol: 1-855-Djelo-Ya (1-855-335-3569) o para ms informacin haga clic aqu or Text READY to 200-400 to register via text  Mr. Humphries - following are the goals we discussed in your visit today:   Goals Addressed   None      The  Primary Caregiver has been provided with contact information for the Managed Medicaid care management team and has been advised to call with any health related questions or concerns.  Finnley Larusso, BSW, MHA [facility]  [place]  High Risk Managed Medicaid Team  [phone]   Following is a copy of your plan of care:  There are no care plans that you recently modified to display for this patient.    

## 2022-02-12 DIAGNOSIS — R4189 Other symptoms and signs involving cognitive functions and awareness: Secondary | ICD-10-CM | POA: Diagnosis not present

## 2022-02-14 DIAGNOSIS — R4189 Other symptoms and signs involving cognitive functions and awareness: Secondary | ICD-10-CM | POA: Diagnosis not present

## 2022-02-15 ENCOUNTER — Ambulatory Visit: Payer: Medicaid Other | Admitting: Cardiology

## 2022-02-15 ENCOUNTER — Encounter: Payer: Self-pay | Admitting: Cardiology

## 2022-02-15 VITALS — BP 102/73 | HR 71 | Temp 98.0°F | Wt 143.0 lb

## 2022-02-15 DIAGNOSIS — I2510 Atherosclerotic heart disease of native coronary artery without angina pectoris: Secondary | ICD-10-CM | POA: Diagnosis not present

## 2022-02-15 DIAGNOSIS — I10 Essential (primary) hypertension: Secondary | ICD-10-CM

## 2022-02-15 DIAGNOSIS — I5020 Unspecified systolic (congestive) heart failure: Secondary | ICD-10-CM | POA: Diagnosis not present

## 2022-02-15 NOTE — Progress Notes (Signed)
Follow up visit  Subjective:   Arthur [name], male    DOB: 03-21-1961, 61 y.o.   MRN: [id number]   HPI  Chief Complaint  Patient presents with   Cardiomyopathy    61 year old African-American male with hypertension, type 2 diabetes mellitus, h/o alcohol abuse, nonobstructive CAD, HFrEF, nonischemic cardiomyopathy, seizure disorder.  Patient is doing well. He denies chest pain, shortness of breath, palpitations, leg edema, orthopnea, PND, TIA/syncope. Reviewed recent test results with the patient, details below. He is not drinking alcohol anymore.      Current Outpatient Medications:    amLODipine (NORVASC) 10 MG tablet, Take 1 tablet (10 mg total) by mouth daily., Disp: 90 tablet, Rfl: 3   aspirin EC 81 MG tablet, Take 1 tablet (81 mg total) by mouth daily. Swallow whole., Disp: 90 tablet, Rfl: 3   atorvastatin (LIPITOR) 40 MG tablet, Take 1 tablet (40 mg total) by mouth daily., Disp: 90 tablet, Rfl: 3   Blood Pressure Monitor KIT, Use to check blood pressure dailly., Disp: 1 kit, Rfl: 0   cholecalciferol (VITAMIN D) 25 MCG (1000 UNIT) tablet, Take by mouth., Disp: 90 tablet, Rfl: 3   hydrALAZINE (APRESOLINE) 50 MG tablet, Take 1 tablet (50 mg total) by mouth every 8 (eight) hours., Disp: 270 tablet, Rfl: 3   isosorbide dinitrate (ISORDIL) 30 MG tablet, Take 1 tablet (30 mg total) by mouth 3 (three) times daily., Disp: 270 tablet, Rfl: 3   lacosamide (VIMPAT) 200 MG TABS tablet, Take 1 tablet (200 mg total) by mouth 2 (two) times daily., Disp: 180 tablet, Rfl: 4   levETIRAcetam (KEPPRA) 750 MG tablet, Take 2 tablets (1,500 mg total) by mouth 2 (two) times daily., Disp: 360 tablet, Rfl: 4   metFORMIN (GLUCOPHAGE) 500 MG tablet, Take 2 tablets (1,000 mg total) by mouth 2 (two) times daily with a meal., Disp: 360 tablet, Rfl: 1   metoprolol tartrate (LOPRESSOR) 50 MG tablet, Take 1 tablet (50 mg total) by mouth 2 (two) times daily., Disp: 180 tablet, Rfl: 3   sacubitril-valsartan  (ENTRESTO) 49-51 MG, Take 1 tablet by mouth 2 (two) times daily. Start on 06/29/2021, Disp: 180 tablet, Rfl: 3   Cardiovascular & other pertient studies:  Reviewed external labs and tests, independently interpreted  Echocardiogram 01/17/2022:  Normal LV systolic function with visual EF 60-65%. Left ventricle size is  decreased. Severe concentric hypertrophy of the left ventricle. Normal  global wall motion. Doppler evidence of grade I (impaired) diastolic  dysfunction, normal LAP. Calculated EF 66%.  Left atrial cavity is normal in size. 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  Right ventricle cavity is normal in size. Mild concentric hypertrophy of  the right ventricle. Normal right ventricular function.  Structurally normal trileaflet aortic valve.  Trace aortic regurgitation.  Structurally normal tricuspid valve.  Mild tricuspid regurgitation. No  evidence of pulmonary hypertension.  no prior available for comparison   Coronary angiogram 06/27/2021: Nonobstructive CAD in LAD and RCA Unable to perform OM RFR evaluation due to severe angulated Lcx takeoff Severe disease in medium sized diag 2. This alone does not explain his cardiomyopathy.  There is no obstructive disease in basal inferior wall, which is usually supplied by RCA.   He doe snot have any angina symptoms at this time. Dyspnea is mild. 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 I do not think this will change with diag revascularization at this time.    Recommend continued management  of HFrEF. Change lisinopril 20 mg to Entresto 49-51 mg bid. If EF remains low in spite of optimal GDMT for heart failure, and if dyspnea persists, could then consider diag revascularization for possible angina equivalent dyspnea.   Recent labs: 12/21/2021: Glucose 86, BUN/Cr 15/1.18. EGFR 72. Na/K 136/4.6. Rest of the CMP normal HbA1C 5.9%  08/2021: H/H 13/43. MCV 78. Platelets  268  02/2021: Chol 147, TG 127, HDL 36, LDL 86   Review of Systems  Cardiovascular:  Negative for chest pain, dyspnea on exertion, leg swelling, palpitations and syncope.        Vitals:   02/15/22 1516  BP: 102/73  Pulse: 71  Temp: 98 F (36.7 C)  SpO2: 95%    Body mass index is 25.33 kg/m. Filed Weights   02/15/22 1516  Weight: 143 lb (64.9 kg)     Objective:   Physical Exam Vitals and nursing note reviewed.  Constitutional:      General: He is not in acute distress. Neck:     Vascular: No JVD.  Cardiovascular:     Rate and Rhythm: Normal rate and regular rhythm.     Heart sounds: Normal heart sounds. No murmur heard. Pulmonary:     Effort: Pulmonary effort is normal.     Breath sounds: Normal breath sounds. No wheezing or rales.  Musculoskeletal:     Right lower leg: No edema.     Left lower leg: No edema.             Visit diagnoses:   ICD-10-CM   1. HFrEF (heart failure with reduced ejection fraction) (HCC)  I50.20     2. Coronary artery disease involving native coronary artery of native heart without angina pectoris  I25.10     3. Essential hypertension  I10         Assessment & Recommendations:    61 year old African-American male with hypertension, type 2 diabetes mellitus, h/o alcohol abuse, nonobstructive CAD, HFrEF, nonischemic cardiomyopathy, seizure disorder.  HFrEF: Euvolemic. EF recovered on echocardiogram (12/2021). Continue current GDMT for HFrEF Cautioned against alcohol use.  CAD: Nonobstrcutvie. Continue Aspirin, statin  Hypertension: Controlled  F/u in 6 months    Nigel Mormon, MD Pager: [phone] Office: [phone]

## 2022-02-16 ENCOUNTER — Other Ambulatory Visit: Payer: Self-pay | Admitting: Obstetrics and Gynecology

## 2022-02-16 NOTE — Patient Outreach (Signed)
Medicaid Managed Care   Nurse Care Manager Note  02/16/2022 Name:  Arthur [name] MRN:  [id number] DOB:  14-Dec-1960  Arthur [name] is an 61 y.o. year old male who is a primary patient of [name], DO.  The [facility] Managed Care Coordination team was consulted for assistance with:    Chronic healthcare management needs, CAD, HTN, DM, seizures, tobacco use, h/o CVA, cognitive impairment  Arthur [name] was given information about Medicaid Managed Care Coordination team services today. [name] Patient agreed to services and verbal consent obtained.  Engaged with patient by telephone for follow up visit in response to provider referral for case management and/or care coordination services.   Assessments/Interventions:  Review of past medical history, allergies, medications, health status, including review of consultants reports, laboratory and other test data, was performed as part of comprehensive evaluation and provision of chronic care management services.  SDOH (Social Determinants of Health) assessments and interventions performed: SDOH Interventions    Flowsheet Row Patient Outreach Telephone from 02/16/2022 in [place] Patient Outreach Telephone from 01/08/2022 in [place] Patient Outreach Telephone from 10/19/2021 in [place] Patient Outreach Telephone from 08/08/2021 in [place] Management from 03/17/2020 in [place] Management from 11/25/2019 in [place] Interventions -- -- -- Other (Comment)  [Wife [place] -- Other (Comment)  [Family receives food stamps]  Housing Interventions -- -- -- Other (Comment)  [Referral to BSW for housing resources] -- --  Transportation Interventions -- --  Other (Comment)  [set up delivery for BP machien] Intervention Not Indicated -- --  Utilities Interventions Intervention Not Indicated -- -- -- -- --  Financial Strain Interventions -- Other (Comment)  [referrals placed] Intervention Not Indicated Other (Comment)  [Referral to [facility] BSW and Pharmacist] Other (Comment)  [referral to [employer] to assist with Medicaid application] Other (Comment)  [Will refer patient to [place] after consent signed]  Physical Activity Interventions -- -- -- Other (Comments)  [Cannot exercise at this time.] -- --  Stress Interventions -- Intervention Not Indicated -- Intervention Not Indicated -- --  Social Connections Interventions -- -- -- Intervention Not Indicated -- --       Care Plan  Allergies  Allergen Reactions   Penicillins Hives    Did it involve swelling of the face/tongue/throat, SOB, or low BP? Y Did it involve sudden or severe rash/hives, skin peeling, or any reaction on the inside of your mouth or nose? N Did you need to seek medical attention at a hospital or doctor's office? Y When did it last happen?  Over 5 Years Ago     If all above answers are "NO", may proceed with cephalosporin use.     Medications Reviewed Today     Reviewed by Gayla Medicus, RN (Registered Nurse) on 02/16/22 at [place] List Status: <None>   Medication Order Taking? Sig Documenting Provider Last Dose Status Informant  amLODipine (NORVASC) 10 MG tablet [id number] No Take 1 tablet (10 mg total) by mouth daily. Riesa Pope, MD Taking Active   aspirin EC 81 MG tablet [id number] No Take 1 tablet (81 mg total) by mouth daily. Swallow whole. [name], [name], PA-C Taking Active   atorvastatin (LIPITOR) 40 MG tablet [id number] No  Take 1 tablet (40 mg total) by mouth daily. Riesa Pope, MD Taking Active   Blood Pressure Monitor KIT [id number] No Use to check blood pressure dailly. [name], MD Taking Active    cholecalciferol (VITAMIN D) 25 MCG (1000 UNIT) tablet [id number] No Take by mouth.  Taking Active   hydrALAZINE (APRESOLINE) 50 MG tablet [id number] No Take 1 tablet (50 mg total) by mouth every 8 (eight) hours. [name], [name], PA-C Taking Active Spouse/Significant Other  isosorbide dinitrate (ISORDIL) 30 MG tablet [id number] No Take 1 tablet (30 mg total) by mouth 3 (three) times daily. [name], [name], PA-C Taking Active Spouse/Significant Other  lacosamide (VIMPAT) 200 MG TABS tablet [id number] No Take 1 tablet (200 mg total) by mouth 2 (two) times daily. Penumalli, Earlean Polka, MD Taking Active   levETIRAcetam (KEPPRA) 750 MG tablet [id number]  Take 2 tablets (1,500 mg total) by mouth 2 (two) times daily. Penumalli, Earlean Polka, MD  Active   metFORMIN (GLUCOPHAGE) 500 MG tablet [id number] No Take 2 tablets (1,000 mg total) by mouth 2 (two) times daily with a meal. Aslam, Sadia, MD Taking Active   metoprolol tartrate (LOPRESSOR) 50 MG tablet [id number] No Take 1 tablet (50 mg total) by mouth 2 (two) times daily. [name], [name], PA-C Taking Active Spouse/Significant Other  sacubitril-valsartan (ENTRESTO) 49-51 MG [id number] No Take 1 tablet by mouth 2 (two) times daily. Start on 06/29/2021 Alethia Berthold, PA-C Taking Active   Med List Note [name], CPhT 02/24/21 1756): Vimpat: "12/08/20 receiving PAP through [employer], approved until 12/08/2022"            Patient Active Problem List   Diagnosis Date Noted   Screening for colon cancer 12/21/2021   Vitamin D deficiency 09/07/2021   Cognitive impairment 09/06/2021   Coronary artery disease    HFrEF (heart failure with reduced ejection fraction) ([place]) 05/10/2021   Encephalopathy acute 03/11/2021   Seizures ([place]) 02/24/2021   Aortic atherosclerosis ([place]) 11/22/2020   History of seizures 11/22/2020   Alcohol use disorder, severe, dependence ([place]) 11/22/2020   Type 2 diabetes mellitus ([place]) 11/22/2020   Anemia 11/22/2020    Hyperlipidemia 11/22/2020   Acute encephalopathy 10/16/2020   Essential hypertension 10/08/2019   History of CVA (cerebrovascular accident) 10/02/2019   Conditions to be addressed/monitored per PCP order:  Chronic healthcare management needs, CAD, HTN, DM, seizures, tobacco use, h/o CVA, cognitive impairment, HLD  Problem: Chronic Disease Management and Care Coordination Needs for DMII, HTN, CAD, Seizures   Priority: High     Long-Range Goal: Development of Plan of Care for Chronic Disease Management and Care Coordination Needs (CAD, HTN, DMII, Seizures)   Start Date: 08/08/2021  Expected End Date: 05/18/2022  Priority: High  Note:   Current Barriers:  Knowledge Deficits related to plan of care for management of CAD, HTN, DMII, and Seizures  Care Coordination needs related to Financial constraints related to affordable safe housing and utility payment, Housing barriers, Medication procurement, ADL IADL limitations, Literacy concerns, Cognitive Deficits, Memory Deficits, Inability to perform IADL's independently, and Lacks knowledge of community resource: safe & affordable Section 8 Housing, utility payment assistance and dental care providers covered by [name].  Chronic Disease Management support and education needs related to CAD, HTN, DMII, and Seizures Financial Constraints.  Non-adherence to prescribed medication regimen Difficulty obtaining medications Cognitive Deficits No Advanced Directives in place Falls - Increased Potential for Falls 02/16/22: Dental resources received.  No complaint's today per patient's DPR, wife.  No seizure activity and has all meds.  Disability denial appealed, aide 59 hours a month.  RNCM Clinical Goal(s):  Patient will verbalize understanding of plan for management of CAD, HTN, DMII, and Seizures as evidenced by improved management of these chronic diseases. verbalize basic understanding of CAD, HTN, DMII, and Seizures disease process  and self health management plan as evidenced by noted improvement of management of these chronic diseases. take all medications exactly as prescribed and will call provider for medication related questions as evidenced by being compliant with all medications    attend all scheduled medical appointments     demonstrate improved adherence to prescribed treatment plan for CAD, HTN, DMII, and Seizures as evidenced by overall improved management of these chronic diseases continue to work with Consulting civil engineer and/or Social Worker to address care management and care coordination needs related to CAD, HTN, DMII, and Seizures as evidenced by adherence to CM Team Scheduled appointments     work with pharmacist to address Financial constraints related to obtaining medications and Medication procurement related to CAD, HTN, DMII, and Seizures as evidenced by review of EMR and patient or pharmacist report    work with Education officer, museum to address Financial constraints related to safe affordable Housing and utility payment , Housing barriers, ADL IADL limitations, Cognitive Deficits, Memory Deficits, Inability to perform IADL's independently, and Lacks knowledge of community resource: safe and affordable Section 8 Housing, utility payment assistance and dental care providers covered by [employer] insurance related to the management of CAD, HTN, DMII, and Seizures as evidenced by review of EMR and patient or Education officer, museum report     through collaboration with Consulting civil engineer, provider, and care team.   Interventions: Inter-disciplinary care team collaboration (see longitudinal plan of care) Evaluation of current treatment plan related to  self management and patient's adherence to plan as established by provider BSW completed telephone outreach with patient and his wife. They stated they are currently in a hotel but are waiting on paperwork and inspections to be completed on the new house. They will be in the hotel  until 08/24/21, but do not know when the new house will be ready to move in. Patient does not have any income right. They are needing assistance with paying the last utility bill from their previous home. BSW informed they can go to DSS and apply for the utility program, however since the services are disconnected due to moving, they may not assist. They provided an email address for the dental resources to be emailed to [email or web address]. No other resources are needed at this time.  09/05/21: BSW completed follow up with patient's wife while patient was at an appointment. She stated they are still in the hotel and patient still does not have any income. They have applied for disability and are waiting for an appeal letter. [name] states they have been in contact with the housing caseworker and she did state that it can take some time.  10/05/21: BSW completed telephone outreach with patient and wife. She stated they moved into their new home on 09/26/21 and they are still unpacking. She states patient is doing really good. She did ask for some resources that will help with household items, BSW will put a list together and send to patient. 11/16/21: BSW completed telephone outreach with patient and wife. She stated they spoke with Medicaid and they asked him if he needed anything. She states patient still does not have any income and they  are still waiting for a decision from disability. She did state that the aide has started coming in the home to assist with patient. No resources are needed at this time.  10/03/2021:  [name] informed this RN Care Manager that patient's disability has been denied.  Patient and wife recently moved from a hotel to a new home in [place].  New Address updated in patient's medical record.  CAD  (Status: Goal on Track (progressing): YES.) Long Term Goal  Assessed understanding of CAD diagnosis Medications reviewed including medications utilized in CAD treatment  plan Provided education on importance of blood pressure control in management of CAD; Counseled on the importance of exercise goals with target of 150 minutes per week Reviewed Importance of taking all medications as prescribed Reviewed Importance of attending all scheduled provider appointments Assessed social determinant of health barriers;  [name] denies patient having any chest pain, edema or shortness of breath. Wife also reports patient weight is stable at 167 lbs.  Discussed with patient's wife the importance of increased exercise and activity for patient.  Discussed need for a cane to help with ambulation and to participate in ADLs and IADLs safely.  Will send request to PCP for order of a cane to help with ambulation now that patient has moved into permanent housing.   Wife was reluctant to have PCP order DME while they resided in a hotel but now that they have moved into permanent housing she would like DME to be ordered and delivered to home. Patient currently has all medications per patient's wife.    Diabetes:  (Status: Goal on Track (progressing): YES.) Long Term Goal  Lab Results  Component Value Date   HGBA1C 7.1 (H) 09/05/2021   HGBA1C=5.9 on 12/21/21 Assessed patient's understanding of A1c goal: <7% Provided education to patient about basic DM disease process; Reviewed medications with patient and discussed importance of medication adherence;        Discussed plans with patient for ongoing care management follow up and provided patient with direct contact information for care management team;      Reviewed scheduled/upcoming provider appointments including: see above in [place];         Referral made to pharmacy team for assistance with complex medication regimen; [facility] Pharmacist involved in care;       Referral made to social work team for assistance with housing and utility payment assistance, dental care providers covered by Managed Medicaid; [facility] BSW  involved in care;      Assessed social determinant of health barriers;        Patient has moved to a new home with wife recently.  Discussed need for a glucometer now that patient has moved to new residence form hotel.   Patient currently has all medication per patient's wife.  [name] denies any episodes or any signs/symptoms of Hypoglycemia or Hyperglycemia.   Seizures  (Status: Goal on Track (progressing): YES.) Long Term Goal  Evaluation of current treatment plan related to  Seizures ,  and any seizure activity,  self-management and patient's adherence to plan as established by provider. Discussed plans with patient for ongoing care management follow up and provided patient with direct contact information for care management team Advised patient to report any seizure activity to PCP; Reviewed medications with patient and discussed importance of medication compliance; Reviewed scheduled/upcoming provider appointments including : see above in [place]; Social Work referral for [employer], Scientist, product/process development and dental care providers; Pharmacy referral  for complex medication regimen; Assessed social determinant of health barriers;  [name] reports no recent seizures.  Patient currently has all medications per patient's wife.  Hypertension: (Status: Goal on Track (progressing): YES.) Long Term Goal  Last practice recorded BP readings:  BP Readings from Last 3 Encounters:  09/05/21 128/78  08/28/21 132/78  07/11/21 128/74     02/15/22-102/73 Most recent eGFR/CrCl:  Lab Results  Component Value Date   EGFR 92 06/06/2021    No components found for: CRCL  Evaluation of current treatment plan related to hypertension self management and patient's adherence to plan as established by provider;   Reviewed prescribed diet Low Salt, Heart Healthy Reviewed medications with patient and discussed importance of compliance;  Provided assistance with obtaining home blood pressure  monitor via contacting  [place] to follow up on Blood Pressure monitor ordered last month.  [facility] staff confirmed that blood pressure monitor is ready.  I requested they deliver the monitor to patient's new home.  [facility] will deliver the monitor.;  Counseled on the importance of exercise goals with target of 150 minutes per week Discussed plans with patient for ongoing care management follow up and provided patient with direct contact information for care management team; Reviewed scheduled/upcoming provider appointments including:  Assessed social determinant of health barriers;   Patient Goals/Self-Care Activities: Take medications as prescribed   Attend all scheduled provider appointments Call pharmacy for medication refills 3-7 days in advance of running out of medications Call provider office for new concerns or questions  Work with the social worker to address care coordination needs and will continue to work with the clinical team to address health care and disease management related needs.   Long-Range Goal: Establish Plan of Care for Chronic Disease Management Needs   Priority: High  Note:   Timeframe:  Long-Range Goal Priority:  High Start Date:    12/05/21                         Expected End Date:  ongoing                     Follow Up Date 03/23/22   - schedule appointment for vaccines needed due to my age or health - schedule recommended health tests - schedule and keep appointment for annual check-up    Why is this important?   Screening tests can find diseases early when they are easier to treat.  Your doctor or nurse will talk with you about which tests are important for you.  Getting shots for common diseases like the flu and shingles will help prevent them.   02/16/22:  Patient seen and evaluated by NEURO 01/23/22 and by CARDS 03/17/22   Follow Up:  Patient agrees to Care Plan and Follow-up.  Plan: The Managed Medicaid care management team  will reach out to the patient again over the next 30 business  days. and The  Patient has been provided with contact information for the Managed Medicaid care management team and has been advised to call with any health related questions or concerns.  Date/time of next scheduled RN care management/care coordination outreach: 03/23/22 at 315

## 2022-02-16 NOTE — Patient Instructions (Signed)
Hi [name], thank you for speaking with me this afternoon, I hope you and Arthur [name] have a nice weekend.  Arthur [name] / [name] was given information about Medicaid Managed Care team care coordination services as a part of their [place] Medicaid benefit. Arthur [name] / [name] verbally consented to engagement with the [facility] Managed Care team.   If you are experiencing a medical emergency, please call 911 or report to your local emergency department or urgent care.   If you have a non-emergency medical problem during routine business hours, please contact your provider's office and ask to speak with a nurse.   For questions related to your [facility], please call: [phone] or visit the homepage here: [email or web address]  If you would like to schedule transportation through your [facility], please call the following number at least 2 days in advance of your appointment: [phone]   Rides for urgent appointments can also be made after hours by calling Member Services.  Call the [facility] at [phone], at any time, 24 hours a day, 7 days a week. If you are in danger or need immediate medical attention call 911.  If you would like help to quit smoking, call 1-800-QUIT-NOW (479)275-4801) OR Espaol: 1-855-Djelo-Ya (1-031-594-5859) o para ms informacin haga clic aqu or Text READY to 200-400 to register via text  Arthur [name] - following are the goals we discussed in your visit today:   Goals Addressed    Timeframe:  Long-Range Goal Priority:  High Start Date:    12/05/21                         Expected End Date:  ongoing                     Follow Up Date 03/23/22   - schedule appointment for vaccines needed due to my age or health - schedule recommended health tests - schedule and keep appointment for  annual check-up    Why is this important?   Screening tests can find diseases early when they are easier to treat.  Your doctor or nurse will talk with you about which tests are important for you.  Getting shots for common diseases like the flu and shingles will help prevent them.   02/16/22:  Patient seen and evaluated by NEURO 01/23/22 and by CARDS 03/17/22  Patient / DPR verbalizes understanding of instructions and care plan provided today and agrees to view in [place]. Active MyChart status and patient understanding of how to access instructions and care plan via MyChart confirmed with patient.     The Managed Medicaid care management team will reach out to the patient again over the next 30 business  days.  The  [employer] ([employer]) has been provided with contact information for the Managed Medicaid care management team and has been advised to call with any health related questions or concerns.   Arthur Raider RN, BSN [place]  [facility] Curator - Managed Medicaid High Risk [phone].   Following is a copy of your plan of care:  Care Plan : [place] of Care  Updates made by Gayla Medicus, RN since 02/16/2022 12:00 AM     Problem: Chronic Disease Management and Care Coordination Needs for DMII, HTN, CAD, Seizures   Priority: High  Long-Range Goal: Development of Plan of Care for Chronic Disease Management and Care Coordination Needs (CAD, HTN, DMII, Seizures)   Start Date: 08/08/2021  Expected End Date: 05/18/2022  Priority: High  Note:   Current Barriers:  Knowledge Deficits related to plan of care for management of CAD, HTN, DMII, and Seizures  Care Coordination needs related to Financial constraints related to affordable safe housing and utility payment, Housing barriers, Medication procurement, ADL IADL limitations, Literacy concerns, Cognitive Deficits, Memory Deficits, Inability to perform IADL's independently,  and Lacks knowledge of community resource: safe & affordable Section 8 Housing, utility payment assistance and dental care providers covered by [name].  Chronic Disease Management support and education needs related to CAD, HTN, DMII, and Seizures Financial Constraints.  Non-adherence to prescribed medication regimen Difficulty obtaining medications Cognitive Deficits No Advanced Directives in place Falls - Increased Potential for Falls 02/16/22: Dental resources received.  No complaint's today per patient's DPR, wife.  No seizure activity and has all meds.  Disability denial appealed, aide 59 hours a month.  RNCM Clinical Goal(s):  Patient will verbalize understanding of plan for management of CAD, HTN, DMII, and Seizures as evidenced by improved management of these chronic diseases. verbalize basic understanding of CAD, HTN, DMII, and Seizures disease process and self health management plan as evidenced by noted improvement of management of these chronic diseases. take all medications exactly as prescribed and will call provider for medication related questions as evidenced by being compliant with all medications    attend all scheduled medical appointments     demonstrate improved adherence to prescribed treatment plan for CAD, HTN, DMII, and Seizures as evidenced by overall improved management of these chronic diseases continue to work with Consulting civil engineer and/or Social Worker to address care management and care coordination needs related to CAD, HTN, DMII, and Seizures as evidenced by adherence to CM Team Scheduled appointments     work with pharmacist to address Financial constraints related to obtaining medications and Medication procurement related to CAD, HTN, DMII, and Seizures as evidenced by review of EMR and patient or pharmacist report    work with Education officer, museum to address Financial constraints related to safe affordable Housing and utility payment , Housing  barriers, ADL IADL limitations, Cognitive Deficits, Memory Deficits, Inability to perform IADL's independently, and Lacks knowledge of community resource: safe and affordable Section 8 Housing, utility payment assistance and dental care providers covered by [employer] insurance related to the management of CAD, HTN, DMII, and Seizures as evidenced by review of EMR and patient or Education officer, museum report     through collaboration with Consulting civil engineer, provider, and care team.   Interventions: Inter-disciplinary care team collaboration (see longitudinal plan of care) Evaluation of current treatment plan related to  self management and patient's adherence to plan as established by provider BSW completed telephone outreach with patient and his wife. They stated they are currently in a hotel but are waiting on paperwork and inspections to be completed on the new house. They will be in the hotel until 08/24/21, but do not know when the new house will be ready to move in. Patient does not have any income right. They are needing assistance with paying the last utility bill from their previous home. BSW informed they can go to DSS and apply for the utility program, however since the services are disconnected due to moving, they may not assist. They provided an email address for the dental resources to be  emailed to [email or web address]. No other resources are needed at this time.  09/05/21: BSW completed follow up with patient's wife while patient was at an appointment. She stated they are still in the hotel and patient still does not have any income. They have applied for disability and are waiting for an appeal letter. [name] states they have been in contact with the housing caseworker and she did state that it can take some time.  10/05/21: BSW completed telephone outreach with patient and wife. She stated they moved into their new home on 09/26/21 and they are still unpacking. She states patient is doing  really good. She did ask for some resources that will help with household items, BSW will put a list together and send to patient. 11/16/21: BSW completed telephone outreach with patient and wife. She stated they spoke with Medicaid and they asked him if he needed anything. She states patient still does not have any income and they are still waiting for a decision from disability. She did state that the aide has started coming in the home to assist with patient. No resources are needed at this time.  10/03/2021:  [name] informed this RN Care Manager that patient's disability has been denied.  Patient and wife recently moved from a hotel to a new home in [place].  New Address updated in patient's medical record.  CAD  (Status: Goal on Track (progressing): YES.) Long Term Goal  Assessed understanding of CAD diagnosis Medications reviewed including medications utilized in CAD treatment plan Provided education on importance of blood pressure control in management of CAD; Counseled on the importance of exercise goals with target of 150 minutes per week Reviewed Importance of taking all medications as prescribed Reviewed Importance of attending all scheduled provider appointments Assessed social determinant of health barriers;  [name] denies patient having any chest pain, edema or shortness of breath. Wife also reports patient weight is stable at 167 lbs.  Discussed with patient's wife the importance of increased exercise and activity for patient.  Discussed need for a cane to help with ambulation and to participate in ADLs and IADLs safely.  Will send request to PCP for order of a cane to help with ambulation now that patient has moved into permanent housing.   Wife was reluctant to have PCP order DME while they resided in a hotel but now that they have moved into permanent housing she would like DME to be ordered and delivered to home. Patient currently has all medications per patient's  wife.    Diabetes:  (Status: Goal on Track (progressing): YES.) Long Term Goal  Lab Results  Component Value Date   HGBA1C 7.1 (H) 09/05/2021   HGBA1C=5.9 on 12/21/21 Assessed patient's understanding of A1c goal: <7% Provided education to patient about basic DM disease process; Reviewed medications with patient and discussed importance of medication adherence;        Discussed plans with patient for ongoing care management follow up and provided patient with direct contact information for care management team;      Reviewed scheduled/upcoming provider appointments including: see above in [place];         Referral made to pharmacy team for assistance with complex medication regimen; [facility] Pharmacist involved in care;       Referral made to social work team for assistance with housing and utility payment assistance, dental care providers covered by Managed Medicaid; [facility] BSW involved in care;      Assessed social determinant  of health barriers;        Patient has moved to a new home with wife recently.  Discussed need for a glucometer now that patient has moved to new residence form hotel.   Patient currently has all medication per patient's wife.  [name] denies any episodes or any signs/symptoms of Hypoglycemia or Hyperglycemia.   Seizures  (Status: Goal on Track (progressing): YES.) Long Term Goal  Evaluation of current treatment plan related to  Seizures ,  and any seizure activity,  self-management and patient's adherence to plan as established by provider. Discussed plans with patient for ongoing care management follow up and provided patient with direct contact information for care management team Advised patient to report any seizure activity to PCP; Reviewed medications with patient and discussed importance of medication compliance; Reviewed scheduled/upcoming provider appointments including : see above in [place]; Social Work referral for [employer], Freight forwarder and dental care providers; Pharmacy referral for complex medication regimen; Assessed social determinant of health barriers;  [name] reports no recent seizures.  Patient currently has all medications per patient's wife.  Hypertension: (Status: Goal on Track (progressing): YES.) Long Term Goal  Last practice recorded BP readings:  BP Readings from Last 3 Encounters:  09/05/21 128/78  08/28/21 132/78  07/11/21 128/74     02/15/22-102/73 Most recent eGFR/CrCl:  Lab Results  Component Value Date   EGFR 92 06/06/2021    No components found for: CRCL  Evaluation of current treatment plan related to hypertension self management and patient's adherence to plan as established by provider;   Reviewed prescribed diet Low Salt, Heart Healthy Reviewed medications with patient and discussed importance of compliance;  Provided assistance with obtaining home blood pressure monitor via contacting  [place] to follow up on Blood Pressure monitor ordered last month.  [facility] staff confirmed that blood pressure monitor is ready.  I requested they deliver the monitor to patient's new home.  [facility] will deliver the monitor.;  Counseled on the importance of exercise goals with target of 150 minutes per week Discussed plans with patient for ongoing care management follow up and provided patient with direct contact information for care management team; Reviewed scheduled/upcoming provider appointments including:  Assessed social determinant of health barriers;   Patient Goals/Self-Care Activities: Take medications as prescribed   Attend all scheduled provider appointments Call pharmacy for medication refills 3-7 days in advance of running out of medications Call provider office for new concerns or questions  Work with the social worker to address care coordination needs and will continue to work with the clinical team to address health care and disease management related  needs

## 2022-02-19 DIAGNOSIS — R4189 Other symptoms and signs involving cognitive functions and awareness: Secondary | ICD-10-CM | POA: Diagnosis not present

## 2022-02-20 DIAGNOSIS — R4189 Other symptoms and signs involving cognitive functions and awareness: Secondary | ICD-10-CM | POA: Diagnosis not present

## 2022-02-21 DIAGNOSIS — R4189 Other symptoms and signs involving cognitive functions and awareness: Secondary | ICD-10-CM | POA: Diagnosis not present

## 2022-02-27 DIAGNOSIS — R4189 Other symptoms and signs involving cognitive functions and awareness: Secondary | ICD-10-CM | POA: Diagnosis not present

## 2022-03-05 ENCOUNTER — Other Ambulatory Visit: Payer: Self-pay

## 2022-03-05 DIAGNOSIS — R4189 Other symptoms and signs involving cognitive functions and awareness: Secondary | ICD-10-CM | POA: Diagnosis not present

## 2022-03-12 DIAGNOSIS — R4189 Other symptoms and signs involving cognitive functions and awareness: Secondary | ICD-10-CM | POA: Diagnosis not present

## 2022-03-14 DIAGNOSIS — R4189 Other symptoms and signs involving cognitive functions and awareness: Secondary | ICD-10-CM | POA: Diagnosis not present

## 2022-03-16 ENCOUNTER — Other Ambulatory Visit: Payer: Self-pay

## 2022-03-19 ENCOUNTER — Other Ambulatory Visit: Payer: Self-pay

## 2022-03-19 IMAGING — DX DG CHEST 1V PORT
1 series · 1 of 1 positions shown · non-contrast
Comparison: October 16, 2020

CLINICAL DATA: Anxiety.

EXAM:
PORTABLE CHEST 1 VIEW

[chest ap]
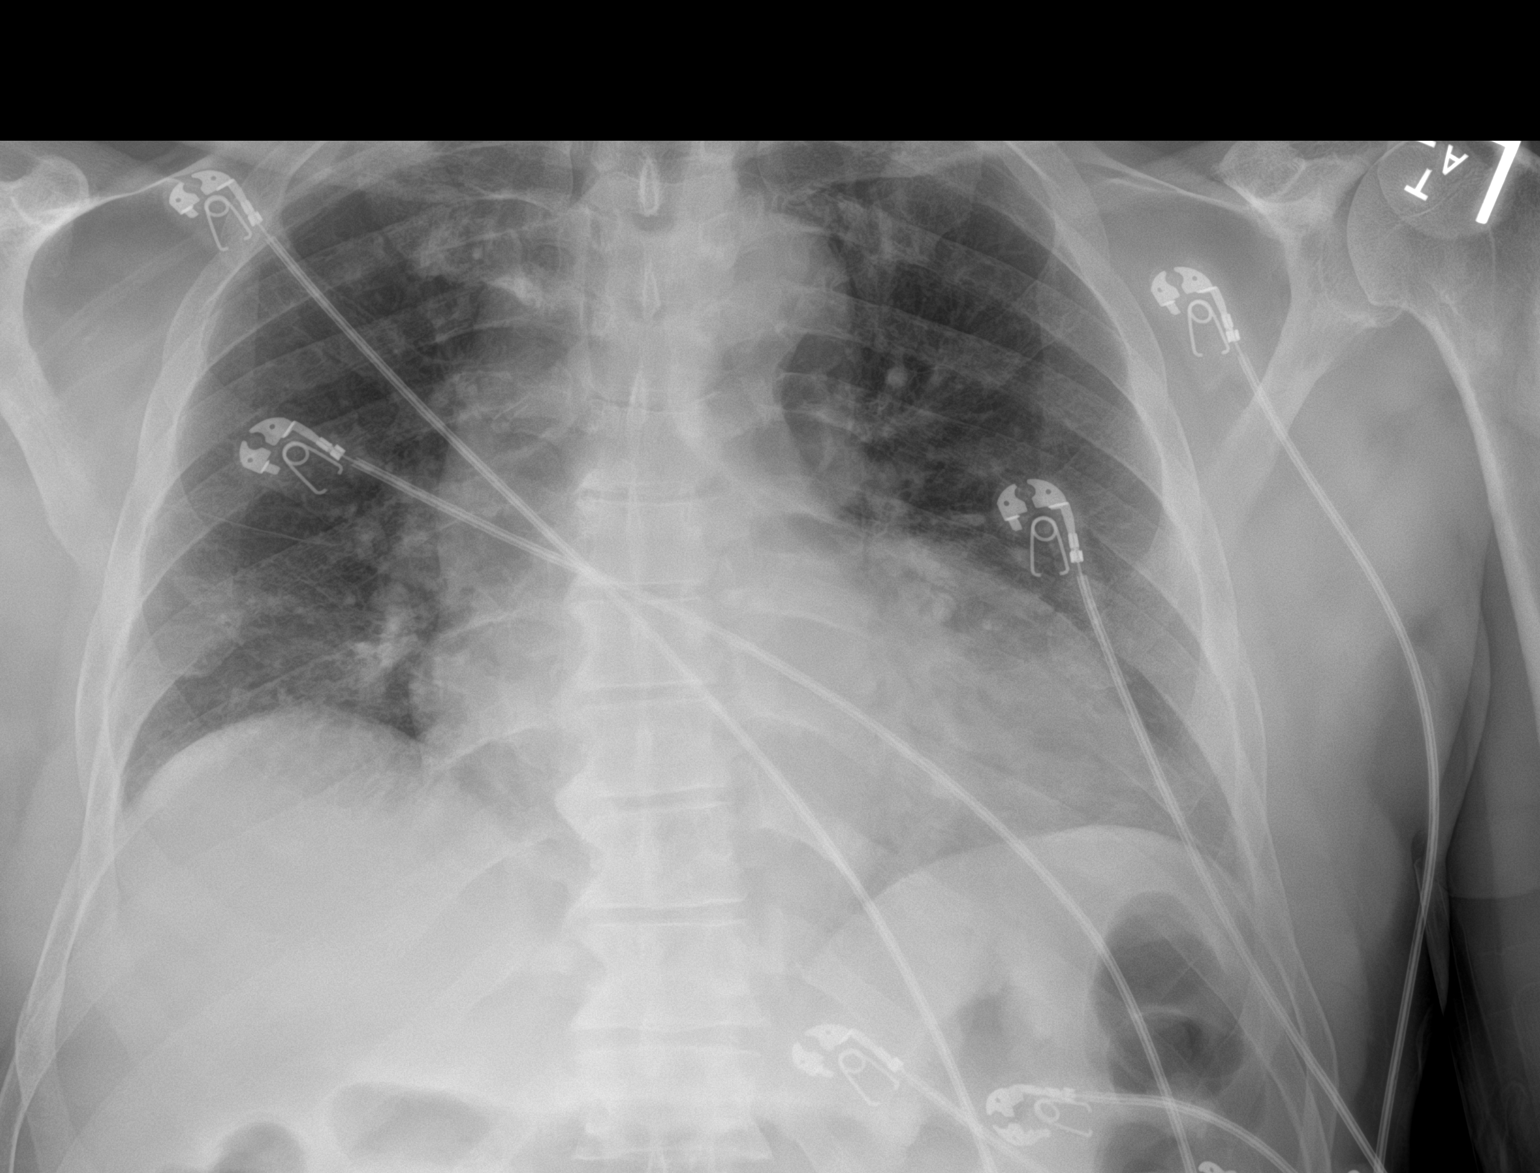

[1 of 1 positions shown; findings below may reference images not displayed]

FINDINGS: Decreased lung volumes are seen with mild areas of atelectasis noted
within the bilateral lung bases. There is no evidence of a pleural
effusion or pneumothorax. The cardiac silhouette is moderately
enlarged. This may be technical in origin. The visualized skeletal
structures are unremarkable.
IMPRESSION: Low lung volumes with mild bibasilar atelectasis.

## 2022-03-19 IMAGING — CT CT HEAD W/O CM
4 series · 17 of 47 positions shown, 19 images · non-contrast
Comparison: 10/16/2020

CLINICAL DATA: Generalized anxiety

EXAM:
CT HEAD WITHOUT CONTRAST
TECHNIQUE: Contiguous axial images were obtained from the base of the skull
through the vertex without intravenous contrast.

[Series 3: head wo · axial · 0.43mm/px · z∈[-122,+4]mm · 7 of 35 slices shown, 9 images]
[im 5/35  brain]
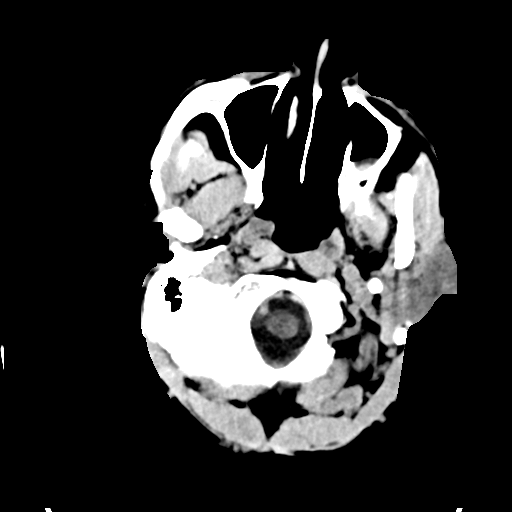
[im 5/35  bone]
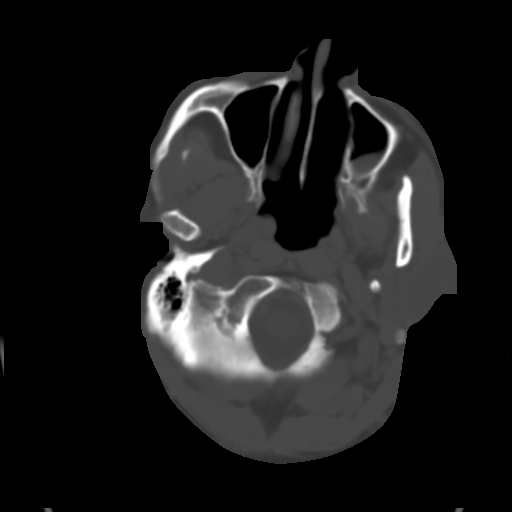
[im 9/35  brain]
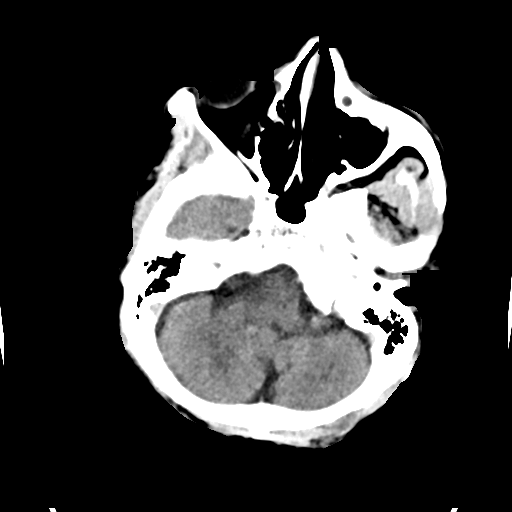
[im 13/35  brain]
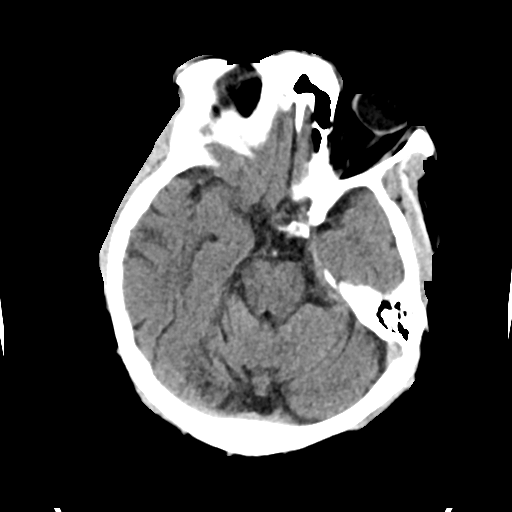
[im 18/35  brain]
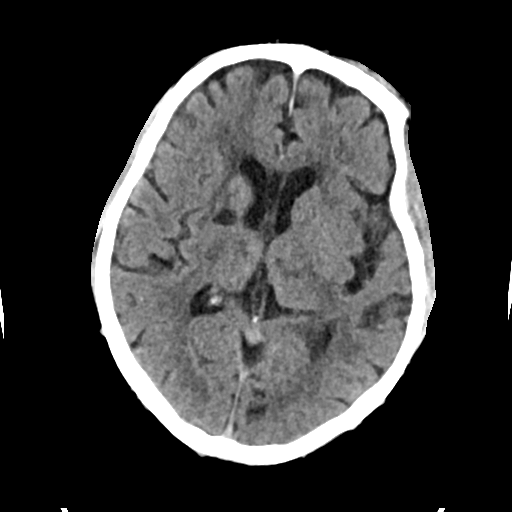
[im 22/35  brain]
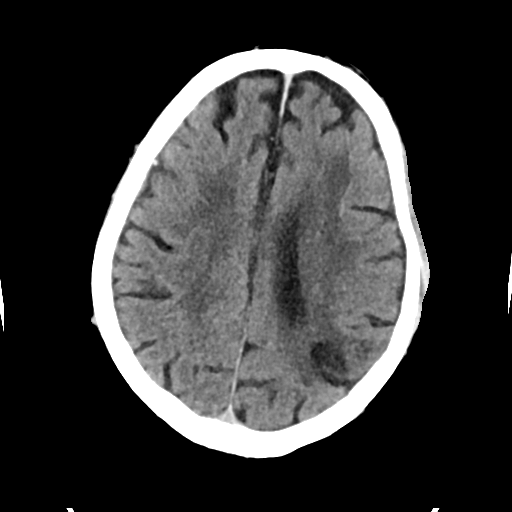
[im 22/35  bone]
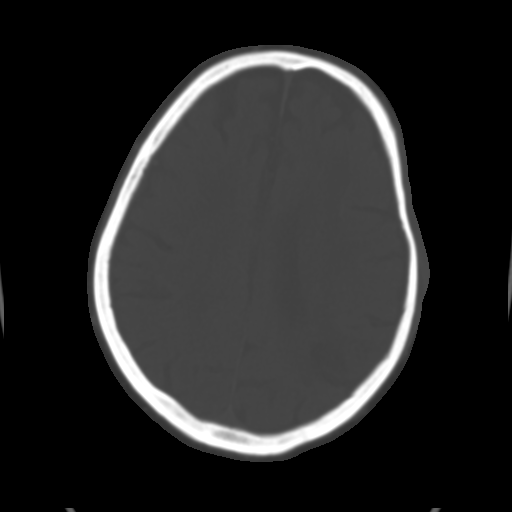
[im 26/35  brain]
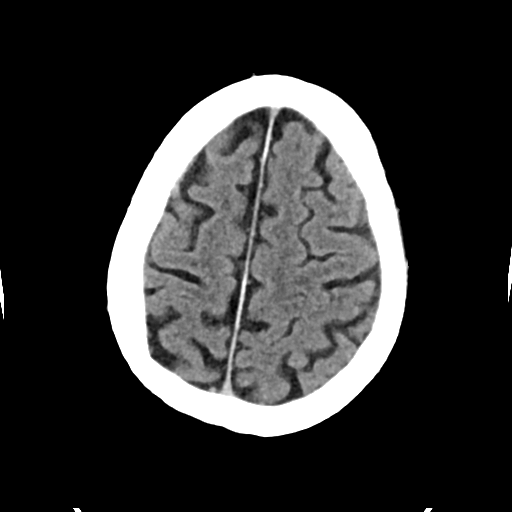
[im 30/35  brain]
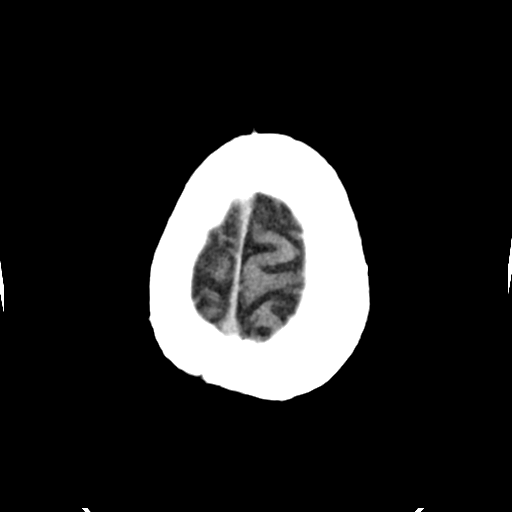

[Series 4: head bone · axial · 0.43mm/px · z∈[-126,-64]mm · 4 of 88 slices shown]
[im 9/88  bone]
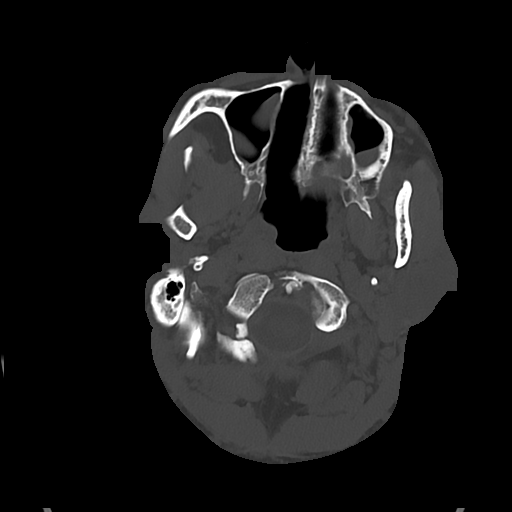
[im 18/88  bone]
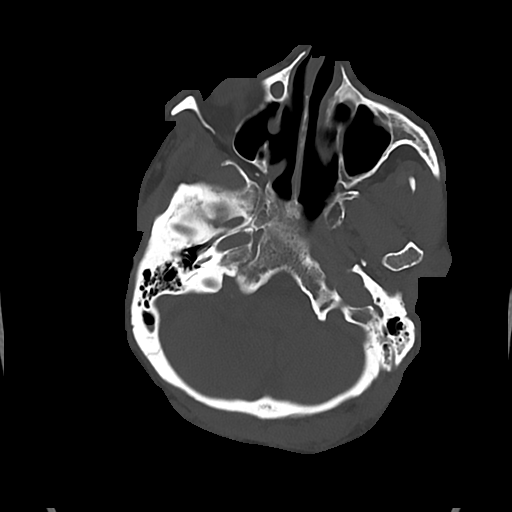
[im 27/88  bone]
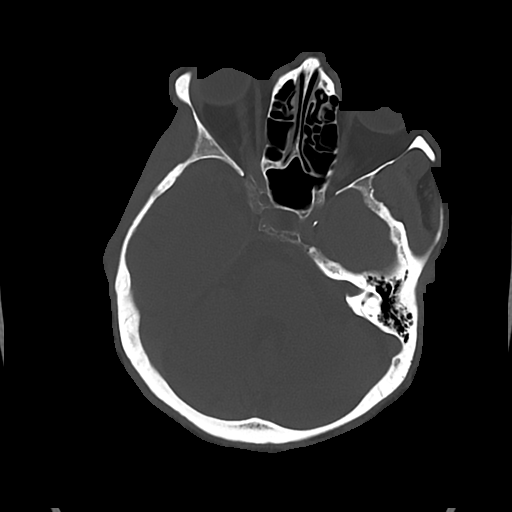
[im 40/88  bone]
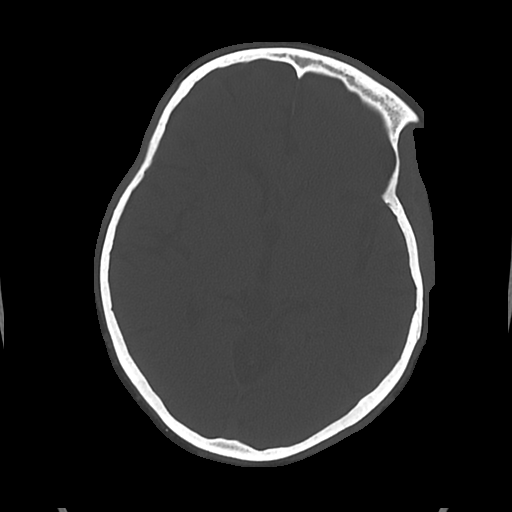

[Series 5: cor soft · coronal · 0.34mm/px · 3 of 67 slices shown]
[im 23/67  brain]
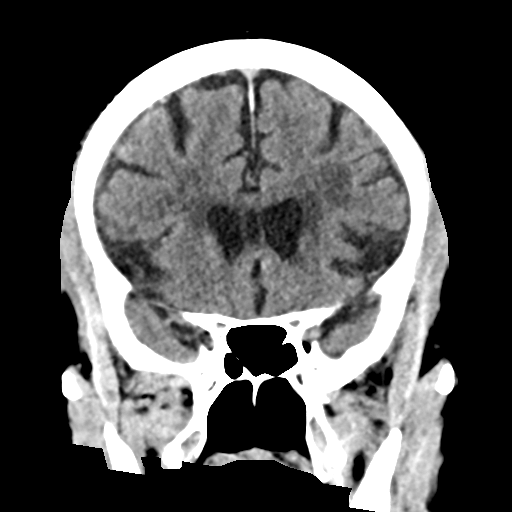
[im 30/67  brain]
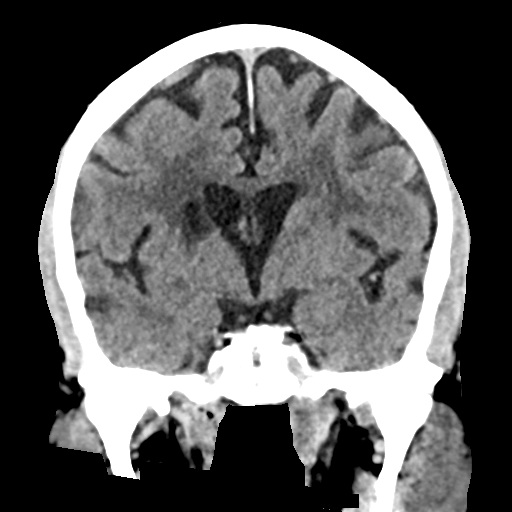
[im 37/67  brain]
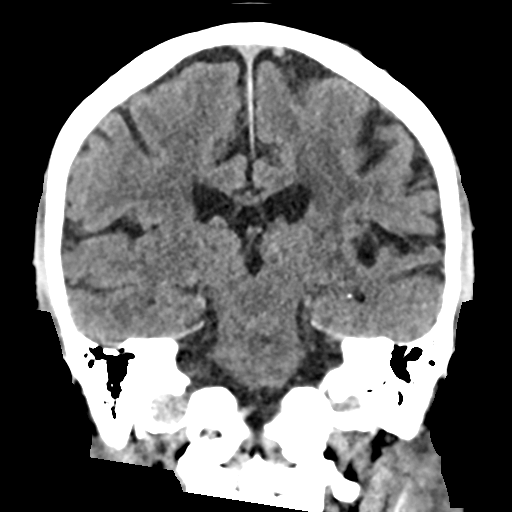

[Series 6: sag soft · sagittal · 0.34mm/px · 3 of 58 slices shown]
[im 22/58  brain]
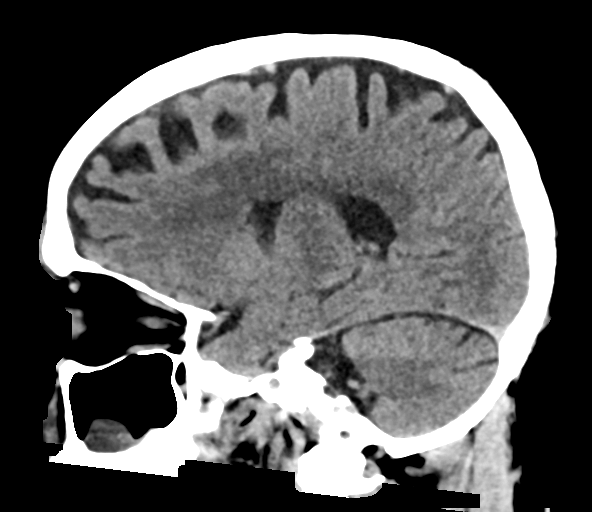
[im 29/58  brain]
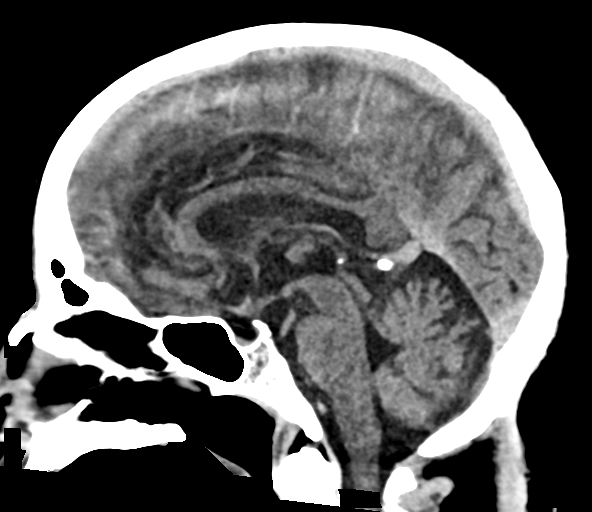
[im 36/58  brain]
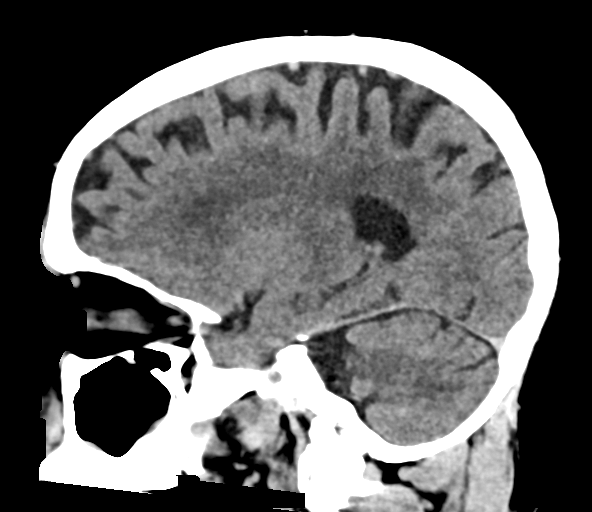

[17 of 47 positions shown; findings below may reference images not displayed]

FINDINGS: Brain: No evidence of acute infarction, hemorrhage, hydrocephalus,
extra-axial collection or mass lesion/mass effect. Periventricular
and deep white matter hypodensity. Unchanged encephalomalacia of the
left parietal lobe and anterior limb of the right internal capsule.

Vascular: No hyperdense vessel or unexpected calcification.

Skull: Normal. Negative for fracture or focal lesion.

Sinuses/Orbits: No acute finding.

Other: None.
IMPRESSION: 1.  No acute intracranial pathology.

2. Small-vessel white matter disease and unchanged encephalomalacia
of the left parietal lobe and anterior limb of the right internal
capsule.

## 2022-03-23 ENCOUNTER — Other Ambulatory Visit: Payer: Self-pay | Admitting: Obstetrics and Gynecology

## 2022-03-23 DIAGNOSIS — I10 Essential (primary) hypertension: Secondary | ICD-10-CM

## 2022-03-23 NOTE — Patient Outreach (Signed)
Medicaid Managed Care   Nurse Care Manager Note  03/23/2022 Name:  Arthur [name] MRN:  [id number] DOB:  Nov 25, 1960  Arthur [name] is an 61 y.o. year old male who is a primary patient of [name], DO.  The [facility] Managed Care Coordination team was consulted for assistance with:    Chronic healthcare management needs, CAD, HTN, DM, seizures, tobacco use, h/o CVA, cognitive impairment, HLD  Arthur [name] was given information about Medicaid Managed Care Coordination team services today. Arthur [name] Patient agreed to services and verbal consent obtained.  Engaged with patient by telephone for follow up visit in response to provider referral for case management and/or care coordination services.   Assessments/Interventions:  Review of past medical history, allergies, medications, health status, including review of consultants reports, laboratory and other test data, was performed as part of comprehensive evaluation and provision of chronic care management services.  SDOH (Social Determinants of Health) assessments and interventions performed: SDOH Interventions    Flowsheet Row Patient Outreach Telephone from 03/23/2022 in [place] Patient Outreach Telephone from 02/16/2022 in [place] Patient Outreach Telephone from 01/08/2022 in [place] Patient Outreach Telephone from 10/19/2021 in [place] Patient Outreach Telephone from 08/08/2021 in [place] Management from 03/17/2020 in [place] Interventions Other (Comment)  [food stamps received] -- -- -- Other (Comment)  [Wife [place] Interventions -- -- -- -- Other (Comment)  [Referral to BSW for housing resources] --  Transportation  Interventions -- -- -- Other (Comment)  [set up delivery for BP machien] Intervention Not Indicated --  Utilities Interventions -- Intervention Not Indicated -- -- -- --  Financial Strain Interventions -- -- Other (Comment)  [referrals placed] Intervention Not Indicated Other (Comment)  [Referral to [facility] BSW and Pharmacist] Other (Comment)  [referral to [employer] to assist with Medicaid application]  Physical Activity Interventions Intervention Not Indicated -- -- -- Other (Comments)  [Cannot exercise at this time.] --  Stress Interventions -- -- Intervention Not Indicated -- Intervention Not Indicated --  Social Connections Interventions -- -- -- -- Intervention Not Indicated --       Care Plan  Allergies  Allergen Reactions   Penicillins Hives    Did it involve swelling of the face/tongue/throat, SOB, or low BP? Y Did it involve sudden or severe rash/hives, skin peeling, or any reaction on the inside of your mouth or nose? N Did you need to seek medical attention at a hospital or doctor's office? Y When did it last happen?  Over 5 Years Ago     If all above answers are "NO", may proceed with cephalosporin use.     Medications Reviewed Today     Reviewed by Gayla Medicus, RN (Registered Nurse) on 03/23/22 at 53  Med List Status: <None>   Medication Order Taking? Sig Documenting Provider Last Dose Status Informant  amLODipine (NORVASC) 10 MG tablet [id number] No Take 1 tablet (10 mg total) by mouth daily.  Patient not taking: Reported on 03/23/2022   Riesa Pope, MD Not Taking Active   aspirin EC 81 MG tablet [id number]  Take 1 tablet (81 mg total) by mouth daily. Swallow whole. [name], [name], PA-C  Active   atorvastatin (LIPITOR) 40 MG tablet [id number]  No Take 1 tablet (40 mg total) by mouth daily.  Patient not taking: Reported on 03/23/2022   Riesa Pope, MD Not Taking Active   Blood Pressure Monitor KIT [id number]  Use to check blood pressure  dailly. [name], MD  Active   cholecalciferol (VITAMIN D) 25 MCG (1000 UNIT) tablet [id number]  Take by mouth.   Active   hydrALAZINE (APRESOLINE) 50 MG tablet [id number]  Take 1 tablet (50 mg total) by mouth every 8 (eight) hours. [name], [name], PA-C  Active Spouse/Significant Other  isosorbide dinitrate (ISORDIL) 30 MG tablet [id number]  Take 1 tablet (30 mg total) by mouth 3 (three) times daily. [name], [name], PA-C  Active Spouse/Significant Other  lacosamide (VIMPAT) 200 MG TABS tablet [id number]  Take 1 tablet (200 mg total) by mouth 2 (two) times daily. Penumalli, Earlean Polka, MD  Active   levETIRAcetam (KEPPRA) 750 MG tablet [id number]  Take 2 tablets (1,500 mg total) by mouth 2 (two) times daily. Penumalli, Earlean Polka, MD  Active   metFORMIN (GLUCOPHAGE) 500 MG tablet [id number]  Take 2 tablets (1,000 mg total) by mouth 2 (two) times daily with a meal. Aslam, Sadia, MD  Expired 03/06/22 2359   metoprolol tartrate (LOPRESSOR) 50 MG tablet [id number]  Take 1 tablet (50 mg total) by mouth 2 (two) times daily. [name], [name], PA-C  Active Spouse/Significant Other  sacubitril-valsartan (ENTRESTO) 49-51 MG [id number]  Take 1 tablet by mouth 2 (two) times daily. Start on 06/29/2021 Verneda Skill  Active   Med List Note [name], CPhT 02/24/21 1756): [place]: "12/08/20 receiving PAP through [employer], approved until 12/08/2022"            Patient Active Problem List   Diagnosis Date Noted   Screening for colon cancer 12/21/2021   Vitamin D deficiency 09/07/2021   Cognitive impairment 09/06/2021   Coronary artery disease    HFrEF (heart failure with reduced ejection fraction) ([place]) 05/10/2021   Encephalopathy acute 03/11/2021   Seizures ([place]) 02/24/2021   Aortic atherosclerosis ([place]) 11/22/2020   History of seizures 11/22/2020   Alcohol use disorder, severe, dependence ([place]) 11/22/2020   Type 2 diabetes mellitus ([place]) 11/22/2020   Anemia 11/22/2020   Hyperlipidemia  11/22/2020   Acute encephalopathy 10/16/2020   Essential hypertension 10/08/2019   History of CVA (cerebrovascular accident) 10/02/2019   Conditions to be addressed/monitored per PCP order:  Chronic healthcare management needs, CAD, HTN, DM, seizures, tobacco use, h/o CVA, cognitive impairment, HLD  Care Plan : RN Care Manager Plan of Care  Updates made by Gayla Medicus, RN since 03/23/2022 12:00 AM     Problem: Chronic Disease Management and Care Coordination Needs for DMII, HTN, CAD, Seizures   Priority: High     Long-Range Goal: Development of Plan of Care for Chronic Disease Management and Care Coordination Needs (CAD, HTN, DMII, Seizures)   Start Date: 08/08/2021  Expected End Date: 05/18/2022  Priority: High  Note:   Current Barriers:  Knowledge Deficits related to plan of care for management of CAD, HTN, DMII, and Seizures  Care Coordination needs related to Financial constraints related to affordable safe housing and utility payment, Housing barriers, Medication procurement, ADL IADL limitations, Literacy concerns, Cognitive Deficits, Memory Deficits, Inability to perform IADL's independently, and Lacks knowledge of community resource: safe & affordable Section 8 Housing, utility payment assistance and dental care providers covered by [name].  Chronic Disease Management support and education needs related to CAD, HTN, DMII,  and Seizures Film/video editor.  Non-adherence to prescribed medication regimen Difficulty obtaining medications Cognitive Deficits No Advanced Directives in place Falls - Increased Potential for Falls 03/23/22:  patient with difficulty affording medications-Pharmacy referral placed.  BP and BG WNL per patient's wife, DPR-no readings available. No seizure activity.  Patient in need of podiatry referral for "calluses" per patient's wife.  RNCM Clinical Goal(s):  Patient will verbalize understanding of plan for management of CAD, HTN,  DMII, and Seizures as evidenced by improved management of these chronic diseases. verbalize basic understanding of CAD, HTN, DMII, and Seizures disease process and self health management plan as evidenced by noted improvement of management of these chronic diseases. take all medications exactly as prescribed and will call provider for medication related questions as evidenced by being compliant with all medications    attend all scheduled medical appointments     demonstrate improved adherence to prescribed treatment plan for CAD, HTN, DMII, and Seizures as evidenced by overall improved management of these chronic diseases continue to work with Consulting civil engineer and/or Social Worker to address care management and care coordination needs related to CAD, HTN, DMII, and Seizures as evidenced by adherence to CM Team Scheduled appointments     work with pharmacist to address Financial constraints related to obtaining medications and Medication procurement related to CAD, HTN, DMII, and Seizures as evidenced by review of EMR and patient or pharmacist report    work with Education officer, museum to address Financial constraints related to safe affordable Housing and utility payment , Housing barriers, ADL IADL limitations, Cognitive Deficits, Memory Deficits, Inability to perform IADL's independently, and Lacks knowledge of community resource: safe and affordable Section 8 Housing, utility payment assistance and dental care providers covered by [employer] insurance related to the management of CAD, HTN, DMII, and Seizures as evidenced by review of EMR and patient or Education officer, museum report     through collaboration with Consulting civil engineer, provider, and care team.   Interventions: Inter-disciplinary care team collaboration (see longitudinal plan of care) Evaluation of current treatment plan related to  self management and patient's adherence to plan as established by provider 10/03/2021:  [name] informed this RN Care  Manager that patient's disability has been denied.  Patient and wife recently moved from a hotel to a new home in [place].  New Address updated in patient's medical record BSW referral for Podiatry resources Collaborated with BSW. Pharmacy referral for medication assistance. Collaborated with Pharmacy  CAD  (Status: Goal on Track (progressing): YES.) Long Term Goal  Assessed understanding of CAD diagnosis Medications reviewed including medications utilized in CAD treatment plan Provided education on importance of blood pressure control in management of CAD; Counseled on the importance of exercise goals with target of 150 minutes per week Reviewed Importance of taking all medications as prescribed Reviewed Importance of attending all scheduled provider appointments Assessed social determinant of health barriers;  [name] denies patient having any chest pain, edema or shortness of breath. Wife also reports patient weight is stable at 167 lbs.  Discussed with patient's wife the importance of increased exercise and activity for patient.  Discussed need for a cane to help with ambulation and to participate in ADLs and IADLs safely.  Will send request to PCP for order of a cane to help with ambulation now that patient has moved into permanent housing.   Wife was reluctant to have PCP order DME while they resided in a hotel but now that they have moved  into permanent housing she would like DME to be ordered and delivered to home.  Diabetes:  (Status: Goal on Track (progressing): YES.) Long Term Goal  Lab Results  Component Value Date   HGBA1C 7.1 (H) 09/05/2021   HGBA1C=5.9 on 12/21/21 Assessed patient's understanding of A1c goal: <7% Provided education to patient about basic DM disease process; Reviewed medications with patient and discussed importance of medication adherence;        Discussed plans with patient for ongoing care management follow up and provided patient with direct contact  information for care management team;      Reviewed scheduled/upcoming provider appointments including: see above in [place];         Referral made to pharmacy team for assistance with complex medication regimen; [facility] Pharmacist involved in care;       Referral made to social work team for assistance with housing and utility payment assistance, dental care providers covered by Managed Medicaid; [facility] BSW involved in care;      Assessed social determinant of health barriers;        Patient has moved to a new home with wife recently.  Discussed need for a glucometer now that patient has moved to new residence form hotel.   [name] denies any episodes or any signs/symptoms of Hypoglycemia or Hyperglycemia.   Seizures  (Status: Goal on Track (progressing): YES.) Long Term Goal  Evaluation of current treatment plan related to  Seizures ,  and any seizure activity,  self-management and patient's adherence to plan as established by provider. Discussed plans with patient for ongoing care management follow up and provided patient with direct contact information for care management team Advised patient to report any seizure activity to PCP; Reviewed medications with patient and discussed importance of medication compliance; Reviewed scheduled/upcoming provider appointments including : see above in [place]; Social Work referral for [employer], Scientist, product/process development and dental care providers; Pharmacy referral for complex medication regimen; Assessed social determinant of health barriers;  [name] reports no recent seizures.  Patient currently has all medications per patient's wife.  Hypertension: (Status: Goal on Track (progressing): YES.) Long Term Goal  Last practice recorded BP readings:  BP Readings from Last 3 Encounters:  09/05/21 128/78  08/28/21 132/78  07/11/21 128/74     02/15/22-102/73 Most recent eGFR/CrCl:  Lab Results  Component Value Date   EGFR 92 06/06/2021     No components found for: CRCL  Evaluation of current treatment plan related to hypertension self management and patient's adherence to plan as established by provider;   Reviewed prescribed diet Low Salt, Heart Healthy Reviewed medications with patient and discussed importance of compliance;  Provided assistance with obtaining home blood pressure monitor via contacting  [place] to follow up on Blood Pressure monitor ordered last month.  [facility] staff confirmed that blood pressure monitor is ready.  I requested they deliver the monitor to patient's new home.  [facility] will deliver the monitor.;  Counseled on the importance of exercise goals with target of 150 minutes per week Discussed plans with patient for ongoing care management follow up and provided patient with direct contact information for care management team; Reviewed scheduled/upcoming provider appointments including:  Assessed social determinant of health barriers;   Patient Goals/Self-Care Activities: Take medications as prescribed   Attend all scheduled provider appointments Call pharmacy for medication refills 3-7 days in advance of running out of medications Call provider office for new concerns or questions  Work with the Education officer, museum to address care coordination needs and will continue to work with the clinical team to address health care and disease management related needs   Long-Range Goal: Establish Plan of Care for Chronic Disease Management Needs   Priority: High  Note:   Timeframe:  Long-Range Goal Priority:  High Start Date:    12/05/21                         Expected End Date:  ongoing                     Follow Up Date 04/25/22   - schedule appointment for vaccines needed due to my age or health - schedule recommended health tests - schedule and keep appointment for annual check-up    Why is this important?   Screening tests can find diseases early when they are easier to treat.   Your doctor or nurse will talk with you about which tests are important for you.  Getting shots for common diseases like the flu and shingles will help prevent them.   03/23/22:  patient up to date on appts-needs referral to Podiatrist   Follow Up:  Patient agrees to Care Plan and Follow-up.  Plan: The Managed Medicaid care management team will reach out to the patient again over the next 30 business  days. and The  Patient has been provided with contact information for the Managed Medicaid care management team and has been advised to call with any health related questions or concerns.  Date/time of next scheduled RN care management/care coordination outreach:  04/25/22 at 1230.

## 2022-03-23 NOTE — Patient Instructions (Signed)
Visit Information  Mr. [place]. Arthur [name]  was given information about Medicaid Managed Care team care coordination services as a part of their [place] Medicaid benefit. Arthur [name] / [name] verbally consented to engagement with the [facility] Managed Care team.   If you are experiencing a medical emergency, please call 911 or report to your local emergency department or urgent care.   If you have a non-emergency medical problem during routine business hours, please contact your provider's office and ask to speak with a nurse.   For questions related to your [facility], please call: [phone] or visit the homepage here: [email or web address]  If you would like to schedule transportation through your [facility], please call the following number at least 2 days in advance of your appointment: [phone]   Rides for urgent appointments can also be made after hours by calling Member Services.  Call the [place] at [phone], at any time, 24 hours a day, 7 days a week. If you are in danger or need immediate medical attention call 911.  If you would like help to quit smoking, call 1-800-QUIT-NOW 636-380-3484) OR Espaol: 1-855-Djelo-Ya (1-856-314-9702) o para ms informacin haga clic aqu or Text READY to 200-400 to register via text  [name] / [name]  following are the goals we discussed in your visit today:   Goals Addressed    Timeframe:  Long-Range Goal Priority:  High Start Date:    12/05/21                         Expected End Date:  ongoing                     Follow Up Date 04/25/22   - schedule appointment for vaccines needed due to my age or health - schedule recommended health tests - schedule and keep appointment for annual check-up    Why is this important?   Screening tests can find diseases early when  they are easier to treat.  Your doctor or nurse will talk with you about which tests are important for you.  Getting shots for common diseases like the flu and shingles will help prevent them.   03/23/22:  patient up to date on appts-needs referral to [facility]  Patient / DPR verbalizes understanding of instructions and care plan provided today and agrees to view in [place]. Active MyChart status and patient / DPR understanding of how to access instructions and care plan via MyChart confirmed with patient/DPR     The Managed Medicaid care management team will reach out to the patient /parent again over the next 30 business  days.  The  [employer] (DPR) has been provided with contact information for the Managed Medicaid care management team and has been advised to call with any health related questions or concerns.   Arthur Raider RN, BSN [place] Management Coordinator - Managed Medicaid High Risk [phone]   Following is a copy of your plan of care:  Care Plan : [place] of Care  Updates made by Gayla Medicus, RN since 03/23/2022 12:00 AM     Problem: Chronic Disease Management and Care Coordination Needs for DMII, HTN, CAD, Seizures   Priority: High     Long-Range Goal: Development of Plan of Care for Chronic Disease Management and Care Coordination Needs (CAD,  HTN, DMII, Seizures)   Start Date: 08/08/2021  Expected End Date: 05/18/2022  Priority: High  Note:   Current Barriers:  Knowledge Deficits related to plan of care for management of CAD, HTN, DMII, and Seizures  Care Coordination needs related to Financial constraints related to affordable safe housing and utility payment, Housing barriers, Medication procurement, ADL IADL limitations, Literacy concerns, Cognitive Deficits, Memory Deficits, Inability to perform IADL's independently, and Lacks knowledge of community resource: safe & affordable Section 8 Housing,  utility payment assistance and dental care providers covered by [name].  Chronic Disease Management support and education needs related to CAD, HTN, DMII, and Seizures Financial Constraints.  Non-adherence to prescribed medication regimen Difficulty obtaining medications Cognitive Deficits No Advanced Directives in place Falls - Increased Potential for Falls 03/23/22:  patient with difficulty affording medications-Pharmacy referral placed.  BP and BG WNL per patient's wife, DPR-no readings available. No seizure activity.  Patient in need of podiatry referral for "calluses" per patient's wife.  RNCM Clinical Goal(s):  Patient will verbalize understanding of plan for management of CAD, HTN, DMII, and Seizures as evidenced by improved management of these chronic diseases. verbalize basic understanding of CAD, HTN, DMII, and Seizures disease process and self health management plan as evidenced by noted improvement of management of these chronic diseases. take all medications exactly as prescribed and will call provider for medication related questions as evidenced by being compliant with all medications    attend all scheduled medical appointments     demonstrate improved adherence to prescribed treatment plan for CAD, HTN, DMII, and Seizures as evidenced by overall improved management of these chronic diseases continue to work with Consulting civil engineer and/or Social Worker to address care management and care coordination needs related to CAD, HTN, DMII, and Seizures as evidenced by adherence to CM Team Scheduled appointments     work with pharmacist to address Financial constraints related to obtaining medications and Medication procurement related to CAD, HTN, DMII, and Seizures as evidenced by review of EMR and patient or pharmacist report    work with Education officer, museum to address Financial constraints related to safe affordable Housing and utility payment , Housing barriers, ADL IADL  limitations, Cognitive Deficits, Memory Deficits, Inability to perform IADL's independently, and Lacks knowledge of community resource: safe and affordable Section 8 Housing, utility payment assistance and dental care providers covered by [employer] insurance related to the management of CAD, HTN, DMII, and Seizures as evidenced by review of EMR and patient or Education officer, museum report     through collaboration with Consulting civil engineer, provider, and care team.   Interventions: Inter-disciplinary care team collaboration (see longitudinal plan of care) Evaluation of current treatment plan related to  self management and patient's adherence to plan as established by provider 10/03/2021:  [name] informed this RN Care Manager that patient's disability has been denied.  Patient and wife recently moved from a hotel to a new home in [place].  New Address updated in patient's medical record BSW referral for Podiatry resources Collaborated with BSW. Pharmacy referral for medication assistance. Collaborated with Pharmacy  CAD  (Status: Goal on Track (progressing): YES.) Long Term Goal  Assessed understanding of CAD diagnosis Medications reviewed including medications utilized in CAD treatment plan Provided education on importance of blood pressure control in management of CAD; Counseled on the importance of exercise goals with target of 150 minutes per week Reviewed Importance of taking all medications as prescribed Reviewed Importance of attending all scheduled  provider appointments Assessed social determinant of health barriers;  [name] denies patient having any chest pain, edema or shortness of breath. Wife also reports patient weight is stable at 167 lbs.  Discussed with patient's wife the importance of increased exercise and activity for patient.  Discussed need for a cane to help with ambulation and to participate in ADLs and IADLs safely.  Will send request to PCP for order of a cane to  help with ambulation now that patient has moved into permanent housing.   Wife was reluctant to have PCP order DME while they resided in a hotel but now that they have moved into permanent housing she would like DME to be ordered and delivered to home.  Diabetes:  (Status: Goal on Track (progressing): YES.) Long Term Goal  Lab Results  Component Value Date   HGBA1C 7.1 (H) 09/05/2021   HGBA1C=5.9 on 12/21/21 Assessed patient's understanding of A1c goal: <7% Provided education to patient about basic DM disease process; Reviewed medications with patient and discussed importance of medication adherence;        Discussed plans with patient for ongoing care management follow up and provided patient with direct contact information for care management team;      Reviewed scheduled/upcoming provider appointments including: see above in [place];         Referral made to pharmacy team for assistance with complex medication regimen; [facility] Pharmacist involved in care;       Referral made to social work team for assistance with housing and utility payment assistance, dental care providers covered by Managed Medicaid; [facility] BSW involved in care;      Assessed social determinant of health barriers;        Patient has moved to a new home with wife recently.  Discussed need for a glucometer now that patient has moved to new residence form hotel.   [name] denies any episodes or any signs/symptoms of Hypoglycemia or Hyperglycemia.   Seizures  (Status: Goal on Track (progressing): YES.) Long Term Goal  Evaluation of current treatment plan related to  Seizures ,  and any seizure activity,  self-management and patient's adherence to plan as established by provider. Discussed plans with patient for ongoing care management follow up and provided patient with direct contact information for care management team Advised patient to report any seizure activity to PCP; Reviewed medications with patient and  discussed importance of medication compliance; Reviewed scheduled/upcoming provider appointments including : see above in [place]; Social Work referral for [employer], Scientist, product/process development and dental care providers; Pharmacy referral for complex medication regimen; Assessed social determinant of health barriers;  [name] reports no recent seizures.  Patient currently has all medications per patient's wife.  Hypertension: (Status: Goal on Track (progressing): YES.) Long Term Goal  Last practice recorded BP readings:  BP Readings from Last 3 Encounters:  09/05/21 128/78  08/28/21 132/78  07/11/21 128/74     02/15/22-102/73 Most recent eGFR/CrCl:  Lab Results  Component Value Date   EGFR 92 06/06/2021    No components found for: CRCL  Evaluation of current treatment plan related to hypertension self management and patient's adherence to plan as established by provider;   Reviewed prescribed diet Low Salt, Heart Healthy Reviewed medications with patient and discussed importance of compliance;  Provided assistance with obtaining home blood pressure monitor via contacting  [place] to follow up on Blood Pressure monitor ordered last month.  [facility] staff confirmed that blood  pressure monitor is ready.  I requested they deliver the monitor to patient's new home.  [facility] will deliver the monitor.;  Counseled on the importance of exercise goals with target of 150 minutes per week Discussed plans with patient for ongoing care management follow up and provided patient with direct contact information for care management team; Reviewed scheduled/upcoming provider appointments including:  Assessed social determinant of health barriers;   Patient Goals/Self-Care Activities: Take medications as prescribed   Attend all scheduled provider appointments Call pharmacy for medication refills 3-7 days in advance of running out of medications Call provider office for new  concerns or questions  Work with the social worker to address care coordination needs and will continue to work with the clinical team to address health care and disease management related needs

## 2022-03-26 ENCOUNTER — Other Ambulatory Visit: Payer: Self-pay

## 2022-03-26 DIAGNOSIS — R4189 Other symptoms and signs involving cognitive functions and awareness: Secondary | ICD-10-CM | POA: Diagnosis not present

## 2022-03-26 NOTE — Patient Instructions (Signed)
Visit Information  [name] was given information about Medicaid Managed Care team care coordination services as a part of their [place] Medicaid benefit. Arthur [name] verbally consented to engagement with the [facility] Managed Care team.   If you are experiencing a medical emergency, please call 911 or report to your local emergency department or urgent care.   If you have a non-emergency medical problem during routine business hours, please contact your provider's office and ask to speak with a nurse.   For questions related to your [facility], please call: [phone] or visit the homepage here: [email or web address]  If you would like to schedule transportation through your [facility], please call the following number at least 2 days in advance of your appointment: [phone]   Rides for urgent appointments can also be made after hours by calling Member Services.  Call the [place] at [phone], at any time, 24 hours a day, 7 days a week. If you are in danger or need immediate medical attention call 911.  If you would like help to quit smoking, call 1-800-QUIT-NOW (956) 072-2095) OR Espaol: 1-855-Djelo-Ya (3-817-711-6579) o para ms informacin haga clic aqu or Text READY to 200-400 to register via text  [name] - following are the goals we discussed in your visit today:   Goals Addressed   None      Social Worker will follow up in 15 days .   Arthur [name], BSW, [place] Managed Medicaid Team  [phone]   Following is a copy of your plan of care:  There are no care plans that you recently modified to display for this patient.

## 2022-03-26 NOTE — Patient Outreach (Signed)
Medicaid Managed Care Social Work Note  03/26/2022 Name:  Arthur [name] MRN:  [id number] DOB:  08-03-1960  Arthur [name] is an 61 y.o. year old male who is a primary patient of Arthur Aurora, DO.  The [name] Managed Care Coordination team was consulted for assistance with:  Community Resources   Arthur [name] was given information about Medicaid Managed Care Coordination team services today. Arthur [name] Patient agreed to services and verbal consent obtained.  Engaged with patient  for by telephone forfollow up visit in response to referral for case management and/or care coordination services.   Assessments/Interventions:  Review of past medical history, allergies, medications, health status, including review of consultants reports, laboratory and other test data, was performed as part of comprehensive evaluation and provision of chronic care management services.  SDOH: (Social Determinant of Health) assessments and interventions performed: SDOH Interventions    Flowsheet Row Patient Outreach Telephone from 03/23/2022 in [place] Patient Outreach Telephone from 02/16/2022 in [place] Patient Outreach Telephone from 01/08/2022 in [place] Patient Outreach Telephone from 10/19/2021 in [place] Patient Outreach Telephone from 08/08/2021 in [place] Management from 03/17/2020 in [place] Interventions Other (Comment)  [food stamps received] -- -- -- Other (Comment)  [Wife [place] Interventions -- -- -- -- Other (Comment)  [Referral to BSW for housing resources] --  Transportation Interventions -- -- -- Other (Comment)  [set up delivery for BP machien] Intervention Not Indicated --   Utilities Interventions -- Intervention Not Indicated -- -- -- --  Financial Strain Interventions -- -- Other (Comment)  [referrals placed] Intervention Not Indicated Other (Comment)  [Referral to [facility] BSW and Pharmacist] Other (Comment)  [referral to [employer] to assist with Medicaid application]  Physical Activity Interventions Intervention Not Indicated -- -- -- Other (Comments)  [Cannot exercise at this time.] --  Stress Interventions -- -- Intervention Not Indicated -- Intervention Not Indicated --  Social Connections Interventions -- -- -- -- Intervention Not Indicated --     BSW completed a telephone outreach with patients wife Arthur [name]. She stated patient has some calluses, and he needs to go to the podiatrist. BSW will mail and email a list of resources for the podiatrist in [place] accepting Medicaid. She stated patient has not received his medications and he is starting to run out. She stated he has a balance and is unable to get his medications.  Advanced Directives Status:  Not addressed in this encounter.  Care Plan                 Allergies  Allergen Reactions   Penicillins Hives    Did it involve swelling of the face/tongue/throat, SOB, or low BP? Y Did it involve sudden or severe rash/hives, skin peeling, or any reaction on the inside of your mouth or nose? N Did you need to seek medical attention at a hospital or doctor's office? Y When did it last happen?  Over 5 Years Ago     If all above answers are "NO", may proceed with cephalosporin use.     Medications Reviewed Today     Reviewed by Arthur Medicus, RN (Registered Nurse) on 03/23/22 at 75  Med List Status: <None>   Medication  Order Taking? Sig Documenting Provider Last Dose Status Informant  amLODipine (NORVASC) 10 MG tablet [id number] No Take 1 tablet (10 mg total) by mouth daily.  Patient not taking: Reported on 03/23/2022   Riesa Pope, MD Not Taking Active   aspirin EC 81 MG  tablet [id number]  Take 1 tablet (81 mg total) by mouth daily. Swallow whole. [name], [name], PA-C  Active   atorvastatin (LIPITOR) 40 MG tablet [id number] No Take 1 tablet (40 mg total) by mouth daily.  Patient not taking: Reported on 03/23/2022   Riesa Pope, MD Not Taking Active   Blood Pressure Monitor KIT [id number]  Use to check blood pressure dailly. [name], MD  Active   cholecalciferol (VITAMIN D) 25 MCG (1000 UNIT) tablet [id number]  Take by mouth.   Active   hydrALAZINE (APRESOLINE) 50 MG tablet [id number]  Take 1 tablet (50 mg total) by mouth every 8 (eight) hours. [name], [name], PA-C  Active Spouse/Significant Other  isosorbide dinitrate (ISORDIL) 30 MG tablet [id number]  Take 1 tablet (30 mg total) by mouth 3 (three) times daily. [name], [name], PA-C  Active Spouse/Significant Other  lacosamide (VIMPAT) 200 MG TABS tablet [id number]  Take 1 tablet (200 mg total) by mouth 2 (two) times daily. Penumalli, Earlean Polka, MD  Active   levETIRAcetam (KEPPRA) 750 MG tablet [id number]  Take 2 tablets (1,500 mg total) by mouth 2 (two) times daily. Penumalli, Earlean Polka, MD  Active   metFORMIN (GLUCOPHAGE) 500 MG tablet [id number]  Take 2 tablets (1,000 mg total) by mouth 2 (two) times daily with a meal. Aslam, Sadia, MD  Expired 03/06/22 2359   metoprolol tartrate (LOPRESSOR) 50 MG tablet [id number]  Take 1 tablet (50 mg total) by mouth 2 (two) times daily. [name], [name], PA-C  Active Spouse/Significant Other  sacubitril-valsartan (ENTRESTO) 49-51 MG [id number]  Take 1 tablet by mouth 2 (two) times daily. Start on 06/29/2021 Verneda Skill  Active   Med List Note [name], CPhT 02/24/21 1756): Vimpat: "12/08/20 receiving PAP through [employer], approved until 12/08/2022"            Patient Active Problem List   Diagnosis Date Noted   Screening for colon cancer 12/21/2021   Vitamin D deficiency 09/07/2021   Cognitive impairment 09/06/2021   Coronary artery  disease    HFrEF (heart failure with reduced ejection fraction) ([place]) 05/10/2021   Encephalopathy acute 03/11/2021   Seizures ([place]) 02/24/2021   Aortic atherosclerosis ([place]) 11/22/2020   History of seizures 11/22/2020   Alcohol use disorder, severe, dependence ([place]) 11/22/2020   Type 2 diabetes mellitus ([place]) 11/22/2020   Anemia 11/22/2020   Hyperlipidemia 11/22/2020   Acute encephalopathy 10/16/2020   Essential hypertension 10/08/2019   History of CVA (cerebrovascular accident) 10/02/2019    Conditions to be addressed/monitored per PCP order:   community resources  There are no care plans that you recently modified to display for this patient.   Follow up:  Patient agrees to Care Plan and Follow-up.  Plan: The Managed Medicaid care management team will reach out to the patient again over the next 15 days.  Date/time of next scheduled Social Work care management/care coordination outreach:  04/16/22  [name], Arita Miss, [place] Medicaid Team  [phone]

## 2022-03-27 DIAGNOSIS — R4189 Other symptoms and signs involving cognitive functions and awareness: Secondary | ICD-10-CM | POA: Diagnosis not present

## 2022-03-29 DIAGNOSIS — R4189 Other symptoms and signs involving cognitive functions and awareness: Secondary | ICD-10-CM | POA: Diagnosis not present

## 2022-03-30 DIAGNOSIS — R4189 Other symptoms and signs involving cognitive functions and awareness: Secondary | ICD-10-CM | POA: Diagnosis not present

## 2022-04-04 ENCOUNTER — Telehealth: Payer: Self-pay | Admitting: Pharmacist

## 2022-04-04 ENCOUNTER — Other Ambulatory Visit: Payer: Medicaid Other | Admitting: Pharmacist

## 2022-04-04 NOTE — Progress Notes (Signed)
Patient requesting to transition to [facility] that offers same day delivery. To facilitate ease of filling and refills, can his metformin and vitamin D be refilled to [facility]?   Thanks!  Catie Eppie Gibson, PharmD, [facility] [phone]

## 2022-04-04 NOTE — Progress Notes (Signed)
Patient requesting to transition to [facility] that offers same day delivery. To facilitate ease of filling and refills, can his Vimpat and levetiracetam be refilled to [facility]?  Thanks!  Catie Eppie Gibson, PharmD, [facility] [phone]

## 2022-04-04 NOTE — Progress Notes (Signed)
Patient requesting to transition to [facility] that offers same day delivery. To facilitate ease of filling and refills, can his atorvastatin, aspirin, amlodipine, metoprolol, Entresto, hydralazine, and isosorbide be refilled to [facility]?   Thanks!  Catie Eppie Gibson, PharmD, [facility] [phone]

## 2022-04-04 NOTE — Progress Notes (Signed)
04/04/2022 Name: Arthur [name] MRN: [id number] DOB: Jun 21, 1960  Chief Complaint  Patient presents with   Medication Management    Arthur [name] is a 61 y.o. year old male who presented for a telephone visit.   They were referred to the pharmacist by their Case Management Team  for assistance in managing medication access.   Patient is participating in a Managed Medicaid Plan:  Yes  Subjective:  Care Team: Primary Care Provider: [name], DO ; Next Scheduled Visit: not scheduled  Medication Access/Adherence  Current Pharmacy:  [place], [place] - [address] [place] [address] Phone: [phone] Fax: [phone]   Patient reports affordability concerns with their medications: Yes  Patient reports access/transportation concerns to their pharmacy: Yes  Patient reports adherence concerns with their medications:  Yes    Diabetes:   Current medications: metformin 1000 mg twice daily - Arthur [name] reports he is completely out      Hyperlipidemia/ASCVD Risk Reduction   Current lipid lowering medications: atorvastatin 40 mg daily - reports he is completely out    Antiplatelet regimen: aspirin 81 mg daily, reports he is completely out   Heart Failure:   Current medications:  ACEi/ARB/ARNI: Entresto 49/51 mg twice daily SGLT2i: none, consider moving forward Beta blocker: metoprolol tartrate 50 mg twice daily Mineralocorticoid Receptor Antagonist: none, consider moving forward Diuretic regimen: none; Additional antihypertensives: amlodipine 10 mg daily; hydralazine/isosorbide 50/30 mg three times daily -reports he is completely out  Seizure Disorder: Current medications: lacosamide 200 mg twice daily, levetiracetam 1500 mg twice daily   Health Maintenance  Health Maintenance Due  Topic Date Due   FOOT EXAM  Never done   OPHTHALMOLOGY EXAM  Never done   Diabetic kidney evaluation - Urine ACR  Never done    TETANUS/TDAP  Never done   COLONOSCOPY (Pts 45-29yr Insurance coverage will need to be confirmed)  Never done   Zoster Vaccines- Shingrix (1 of 2) Never done   COVID-19 Vaccine (2 - Pfizer series) 04/05/2020   INFLUENZA VACCINE  12/26/2021     Objective: Lab Results  Component Value Date   HGBA1C 5.9 (A) 12/21/2021    Lab Results  Component Value Date   CREATININE 1.18 12/21/2021   BUN 15 12/21/2021   NA 136 12/21/2021   K 4.6 12/21/2021   CL 100 12/21/2021   CO2 20 12/21/2021    Lab Results  Component Value Date   CHOL 147 03/23/2021   HDL 36 (L) 03/23/2021   LDLCALC 86 03/23/2021   LDLDIRECT 124.3 (H) 10/17/2020   TRIG 127 03/23/2021   CHOLHDL 4.1 03/23/2021    Medications Reviewed Today     Reviewed by HOsker Mason RPH-CPP (Pharmacist) on 04/04/22 at 1038  Med List Status: <None>   Medication Order Taking? Sig Documenting Provider Last Dose Status Informant  amLODipine (NORVASC) 10 MG tablet [id number] Take 1 tablet (10 mg total) by mouth daily.  Patient not taking: Reported on 03/23/2022   KRiesa Pope MD Not Taking Active   aspirin EC 81 MG tablet [id number] Take 1 tablet (81 mg total) by mouth daily. Swallow whole.  Patient not taking: Reported on 04/04/2022   CAlethia Berthold PA-C Not Taking Active   atorvastatin (LIPITOR) 40 MG tablet [id number] Take 1 tablet (40 mg total) by mouth daily.  Patient not taking: Reported on 03/23/2022   KRiesa Pope MD Not Taking Active   Blood Pressure Monitor  KIT [id number] Yes Use to check blood pressure dailly. [name], MD Taking Active   cholecalciferol (VITAMIN D) 25 MCG (1000 UNIT) tablet [id number] No Take by mouth.  Patient not taking: Reported on 04/04/2022    Not Taking Active   hydrALAZINE (APRESOLINE) 50 MG tablet [id number] Yes Take 1 tablet (50 mg total) by mouth every 8 (eight) hours. [name], [name], PA-C Taking Active Spouse/Significant Other  isosorbide dinitrate  (ISORDIL) 30 MG tablet [id number] Yes Take 1 tablet (30 mg total) by mouth 3 (three) times daily. [name], [name], PA-C Taking Active Spouse/Significant Other  lacosamide (VIMPAT) 200 MG TABS tablet [id number] Yes Take 1 tablet (200 mg total) by mouth 2 (two) times daily. Penumalli, Earlean Polka, MD Taking Active   levETIRAcetam (KEPPRA) 750 MG tablet [id number] Yes Take 2 tablets (1,500 mg total) by mouth 2 (two) times daily. Penumalli, Earlean Polka, MD Taking Active   metFORMIN (GLUCOPHAGE) 500 MG tablet [id number] No Take 2 tablets (1,000 mg total) by mouth 2 (two) times daily with a meal.  Patient not taking: Reported on 04/04/2022   [name], MD Not Taking Expired 03/06/22 2359   metoprolol tartrate (LOPRESSOR) 50 MG tablet [id number] Yes Take 1 tablet (50 mg total) by mouth 2 (two) times daily. [name], [name], PA-C Taking Active Spouse/Significant Other  sacubitril-valsartan (ENTRESTO) 49-51 MG [id number] Yes Take 1 tablet by mouth 2 (two) times daily. Start on 06/29/2021 Alethia Berthold, PA-C Taking Active   Med List Note [name], CPhT 02/24/21 1756): Vimpat: "12/08/20 receiving PAP through [employer], approved until 12/08/2022"              Assessment/Plan:   Diabetes: - Controlled - Recommend to continue current regimen at this time, Consider SGLT2 moving forward due to concurrent HF.   Hyperlipidemia/ASCVD Risk Reduction - Moderately well controlled - Recommend to continue current regimen at this time. Will collaborate with providers to send refills to [facility].    Heart Failure: - Appropriately managed, though some opportunity for optimization - Recommend to continue current regimen at this time. Will collaborate with providers to send refills to [facility].   Seizure Disorder: - Controlled - Recommend to continue current regimen at this time. Will collaborate with providers to send refills to [facility].   Follow Up Plan: phone call later this week  for follow up   Catie Hedwig Morton, PharmD, [place] [phone]

## 2022-04-05 ENCOUNTER — Other Ambulatory Visit: Payer: Medicaid Other | Admitting: Pharmacist

## 2022-04-06 ENCOUNTER — Encounter: Payer: Self-pay | Admitting: Podiatry

## 2022-04-09 ENCOUNTER — Other Ambulatory Visit: Payer: Medicaid Other | Admitting: Pharmacist

## 2022-04-09 ENCOUNTER — Other Ambulatory Visit (HOSPITAL_COMMUNITY): Payer: Self-pay

## 2022-04-09 ENCOUNTER — Other Ambulatory Visit: Payer: Self-pay

## 2022-04-09 DIAGNOSIS — R4189 Other symptoms and signs involving cognitive functions and awareness: Secondary | ICD-10-CM | POA: Diagnosis not present

## 2022-04-09 NOTE — Progress Notes (Signed)
Care Coordination Call  Appears patient's care team has not sent refills to [facility] as requested last week.   Patent examiner. Provided patient information. Collaborated with Pharmacy at [facility] to transfer out his orders. Jeanene Erb patient's partner, [name]. Provided phone number to [facility] for her to call and confirm the medications they need this week.   Catie Eppie Gibson, PharmD, [facility] [phone]

## 2022-04-10 ENCOUNTER — Ambulatory Visit: Payer: Medicaid Other | Admitting: Podiatry

## 2022-04-16 ENCOUNTER — Other Ambulatory Visit: Payer: Medicaid Other

## 2022-04-16 NOTE — Patient Outreach (Signed)
Medicaid Managed Care Social Work Note  04/16/2022 Name:  Arthur [name] MRN:  [id number] DOB:  1960/09/16  Arthur [name] is an 61 y.o. year old male who is a primary patient of [name], DO.  The [facility] Managed Care Coordination team was consulted for assistance with:  Community Resources   Mr. Hershman was given information about Medicaid Managed Care Coordination team services today. [name] Patient agreed to services and verbal consent obtained.  Engaged with patient  for by telephone forfollow up visit in response to referral for case management and/or care coordination services.   Assessments/Interventions:  Review of past medical history, allergies, medications, health status, including review of consultants reports, laboratory and other test data, was performed as part of comprehensive evaluation and provision of chronic care management services.  SDOH: (Social Determinant of Health) assessments and interventions performed: SDOH Interventions    Flowsheet Row Patient Outreach Telephone from 03/23/2022 in [place] Patient Outreach Telephone from 02/16/2022 in [place] Patient Outreach Telephone from 01/08/2022 in [place] Patient Outreach Telephone from 10/19/2021 in [place] Patient Outreach Telephone from 08/08/2021 in [place] Management from 03/17/2020 in [place] Interventions Other (Comment)  [food stamps received] -- -- -- Other (Comment)  [Wife [place] Interventions -- -- -- -- Other (Comment)  [Referral to BSW for housing resources] --  Transportation Interventions -- -- -- Other (Comment)  [set up delivery for BP machien] Intervention Not Indicated --   Utilities Interventions -- Intervention Not Indicated -- -- -- --  Financial Strain Interventions -- -- Other (Comment)  [referrals placed] Intervention Not Indicated Other (Comment)  [Referral to [facility] BSW and Pharmacist] Other (Comment)  [referral to [employer] to assist with Medicaid application]  Physical Activity Interventions Intervention Not Indicated -- -- -- Other (Comments)  [Cannot exercise at this time.] --  Stress Interventions -- -- Intervention Not Indicated -- Intervention Not Indicated --  Social Connections Interventions -- -- -- -- Intervention Not Indicated --     BSW completed a telephone outreach with patient and wife, they state everything is going well, they are waiting to hear back from [facility] on delivery of medications. No other resources are needed at this time.  Advanced Directives Status:  Not addressed in this encounter.  Care Plan                 Allergies  Allergen Reactions   Penicillins Hives    Did it involve swelling of the face/tongue/throat, SOB, or low BP? Y Did it involve sudden or severe rash/hives, skin peeling, or any reaction on the inside of your mouth or nose? N Did you need to seek medical attention at a hospital or doctor's office? Y When did it last happen?  Over 5 Years Ago     If all above answers are "NO", may proceed with cephalosporin use.     Medications Reviewed Today     Reviewed by Osker Mason, RPH-CPP (Pharmacist) on 04/04/22 at 1038  Med List Status: <None>   Medication Order Taking? Sig Documenting Provider Last Dose Status Informant  amLODipine (NORVASC) 10 MG tablet [id number] No Take 1 tablet (10 mg total) by mouth daily.  Patient not taking: Reported on 03/23/2022  Riesa Pope, MD Not Taking Active   aspirin EC 81 MG tablet [id number] No Take 1 tablet (81 mg total) by mouth daily. Swallow whole.  Patient not taking: Reported on 04/04/2022   Alethia Berthold, PA-C Not Taking Active    atorvastatin (LIPITOR) 40 MG tablet [id number] No Take 1 tablet (40 mg total) by mouth daily.  Patient not taking: Reported on 03/23/2022   Riesa Pope, MD Not Taking Active   Blood Pressure Monitor KIT [id number] Yes Use to check blood pressure dailly. [name], MD Taking Active   cholecalciferol (VITAMIN D) 25 MCG (1000 UNIT) tablet [id number] No Take by mouth.  Patient not taking: Reported on 04/04/2022    Not Taking Active   hydrALAZINE (APRESOLINE) 50 MG tablet [id number] Yes Take 1 tablet (50 mg total) by mouth every 8 (eight) hours. [name], [name], PA-C Taking Active Spouse/Significant Other  isosorbide dinitrate (ISORDIL) 30 MG tablet [id number] Yes Take 1 tablet (30 mg total) by mouth 3 (three) times daily. [name], [name], PA-C Taking Active Spouse/Significant Other  lacosamide (VIMPAT) 200 MG TABS tablet [id number] Yes Take 1 tablet (200 mg total) by mouth 2 (two) times daily. Penumalli, Earlean Polka, MD Taking Active   levETIRAcetam (KEPPRA) 750 MG tablet [id number] Yes Take 2 tablets (1,500 mg total) by mouth 2 (two) times daily. Penumalli, Earlean Polka, MD Taking Active   metFORMIN (GLUCOPHAGE) 500 MG tablet [id number] No Take 2 tablets (1,000 mg total) by mouth 2 (two) times daily with a meal.  Patient not taking: Reported on 04/04/2022   [name], MD Not Taking Expired 03/06/22 2359   metoprolol tartrate (LOPRESSOR) 50 MG tablet [id number] Yes Take 1 tablet (50 mg total) by mouth 2 (two) times daily. [name], [name], PA-C Taking Active Spouse/Significant Other  sacubitril-valsartan (ENTRESTO) 49-51 MG [id number] Yes Take 1 tablet by mouth 2 (two) times daily. Start on 06/29/2021 Alethia Berthold, PA-C Taking Active   Med List Note [name], CPhT 02/24/21 1756): Vimpat: "12/08/20 receiving PAP through [employer], approved until 12/08/2022"            Patient Active Problem List   Diagnosis Date Noted   Screening for colon cancer 12/21/2021   Vitamin D  deficiency 09/07/2021   Cognitive impairment 09/06/2021   Coronary artery disease    HFrEF (heart failure with reduced ejection fraction) ([place]) 05/10/2021   Encephalopathy acute 03/11/2021   Seizures ([place]) 02/24/2021   Aortic atherosclerosis ([place]) 11/22/2020   History of seizures 11/22/2020   Alcohol use disorder, severe, dependence ([place]) 11/22/2020   Type 2 diabetes mellitus ([place]) 11/22/2020   Anemia 11/22/2020   Hyperlipidemia 11/22/2020   Acute encephalopathy 10/16/2020   Essential hypertension 10/08/2019   History of CVA (cerebrovascular accident) 10/02/2019    Conditions to be addressed/monitored per PCP order:   community resources  There are no care plans that you recently modified to display for this patient.   Follow up:  Patient agrees to Care Plan and Follow-up.  Plan: The Managed Medicaid care management team will reach out to the patient again over the next 30-60 days.  Date/time of next scheduled Social Work care management/care coordination outreach:  06/06/22  [name], Arita Miss, [place] Medicaid Team  [phone]

## 2022-04-16 NOTE — Patient Instructions (Signed)
Visit Information  Mr. Khim was given information about Medicaid Managed Care team care coordination services as a part of their [facility] Community Plan Medicaid benefit. [name] verbally consented to engagement with the [facility] Managed Care team.   If you are experiencing a medical emergency, please call 911 or report to your local emergency department or urgent care.   If you have a non-emergency medical problem during routine business hours, please contact your provider's office and ask to speak with a nurse.   For questions related to your [facility], please call: [phone] or visit the homepage here: [email or web address]  If you would like to schedule transportation through your [facility], please call the following number at least 2 days in advance of your appointment: [phone]   Rides for urgent appointments can also be made after hours by calling Member Services.  Call the Behavioral Health Crisis Line at [phone], at any time, 24 hours a day, 7 days a week. If you are in danger or need immediate medical attention call 911.  If you would like help to quit smoking, call 1-800-QUIT-NOW (412-374-6978) OR Espaol: 1-855-Djelo-Ya (3-557-322-0254) o para ms informacin haga clic aqu or Text READY to 270-623 to register via text  Mr. Knoblock - following are the goals we discussed in your visit today:   Goals Addressed   None      Social Worker will follow up in 06/06/22.   Gus Puma, BSW, [place] [facility]  [place]  High Risk Managed Medicaid Team  [phone]   Following is a copy of your plan of care:  There are no care plans that you recently modified to display for this patient.

## 2022-04-24 DIAGNOSIS — R4189 Other symptoms and signs involving cognitive functions and awareness: Secondary | ICD-10-CM | POA: Diagnosis not present

## 2022-04-25 ENCOUNTER — Encounter: Payer: Self-pay | Admitting: Obstetrics and Gynecology

## 2022-04-25 ENCOUNTER — Other Ambulatory Visit: Payer: Self-pay | Admitting: Pharmacist

## 2022-04-25 ENCOUNTER — Other Ambulatory Visit: Payer: Medicaid Other | Admitting: Obstetrics and Gynecology

## 2022-04-25 ENCOUNTER — Other Ambulatory Visit: Payer: Self-pay | Admitting: *Deleted

## 2022-04-25 ENCOUNTER — Other Ambulatory Visit: Payer: Self-pay | Admitting: Neurology

## 2022-04-25 ENCOUNTER — Other Ambulatory Visit: Payer: Self-pay

## 2022-04-25 DIAGNOSIS — R569 Unspecified convulsions: Secondary | ICD-10-CM

## 2022-04-25 DIAGNOSIS — I10 Essential (primary) hypertension: Secondary | ICD-10-CM

## 2022-04-25 MED ORDER — LACOSAMIDE 200 MG PO TABS: 200.0000 mg | ORAL_TABLET | Freq: Two times a day (BID) | ORAL | 3 refills | Status: DC

## 2022-04-25 MED ORDER — LEVETIRACETAM 750 MG PO TABS: 1500.0000 mg | ORAL_TABLET | Freq: Two times a day (BID) | ORAL | 3 refills | Status: DC

## 2022-04-25 MED ORDER — ENTRESTO 49-51 MG PO TABS: 1.0000 | ORAL_TABLET | Freq: Two times a day (BID) | ORAL | 3 refills | Status: DC

## 2022-04-25 MED ORDER — LACOSAMIDE 200 MG PO TABS: 200.0000 mg | ORAL_TABLET | Freq: Two times a day (BID) | ORAL | 1 refills | Status: DC

## 2022-04-25 MED ORDER — ATORVASTATIN CALCIUM 40 MG PO TABS: 40.0000 mg | ORAL_TABLET | Freq: Every day | ORAL | 3 refills | Status: DC

## 2022-04-25 MED ORDER — METOPROLOL TARTRATE 50 MG PO TABS: 50.0000 mg | ORAL_TABLET | Freq: Two times a day (BID) | ORAL | 3 refills | Status: DC

## 2022-04-25 MED ORDER — AMLODIPINE BESYLATE 10 MG PO TABS: 10.0000 mg | ORAL_TABLET | Freq: Every day | ORAL | 3 refills | Status: DC

## 2022-04-25 MED ORDER — ASPIRIN 81 MG PO TBEC: 81.0000 mg | DELAYED_RELEASE_TABLET | Freq: Every day | ORAL | 3 refills | Status: DC

## 2022-04-25 MED ORDER — ISOSORBIDE DINITRATE 30 MG PO TABS: 30.0000 mg | ORAL_TABLET | Freq: Three times a day (TID) | ORAL | 3 refills | Status: DC

## 2022-04-25 MED ORDER — HYDRALAZINE HCL 50 MG PO TABS: 50.0000 mg | ORAL_TABLET | Freq: Three times a day (TID) | ORAL | 3 refills | Status: DC

## 2022-04-25 NOTE — Progress Notes (Signed)
Care Coordination Call  Received message from Kathi Der, RN CM. Patient has still not received patient's medications from [facility]. Per chart review, phone messages requesting refills be sent were not viewed/seen.   Contacted Dr. Marjory Lies regarding refills on Vimpat and Keppra. Patient will follow up in ~ a year from last appointment.   Contacted Dr. Allena Katz on refills for metformin, vitamin D. Patient is due to schedule follow up with PCP office.   Contacted Dr. Sundra Aland on refills for atorvastatin, aspirin, amlodipine, metoprolol, Entresto, hydralazine, and isosorbide. Patient to follow up with his office in 07/2022.   Patent examiner. Left voicemail asking for new orders for patient to be filled and delivered. Contacted patient's partner, [name], and notified. I will follow up with [facility] tomorrow.   Catie Eppie Gibson, PharmD, [facility] [phone]

## 2022-04-25 NOTE — Telephone Encounter (Signed)
Pt is in need of a new script for vimpat to be sent to that pharmacy. Will send a new script to be forwarded to MD to send for the patient.

## 2022-04-25 NOTE — Patient Outreach (Signed)
Medicaid Managed Care   Nurse Care Manager Note  04/25/2022 Name:  Arthur [name] MRN:  [id number] DOB:  1960/12/23  Arthur [name] is an 61 y.o. year old male who is a primary patient of [name], DO.  The [facility] Managed Care Coordination team was consulted for assistance with:    Chronic healthcare management needs, HTN, tobacco use, DM, seizures, HLD, h/o CVA, CAD  [name] / patient's DPR was given information about Medicaid Managed Care Coordination team services today. Arthur [name]/ DPR  agreed to services and verbal consent obtained.  Engaged with patient / DPR by telephone for follow up visit in response to provider referral for case management and/or care coordination services.   Assessments/Interventions:  Review of past medical history, allergies, medications, health status, including review of consultants reports, laboratory and other test data, was performed as part of comprehensive evaluation and provision of chronic care management services.  SDOH (Social Determinants of Health) assessments and interventions performed: SDOH Interventions    Flowsheet Row Patient Outreach Telephone from 04/25/2022 in [place] Patient Outreach Telephone from 03/23/2022 in [place] Patient Outreach Telephone from 02/16/2022 in [place] Patient Outreach Telephone from 01/08/2022 in [place] Patient Outreach Telephone from 10/19/2021 in [place] Patient Outreach Telephone from 08/08/2021 in [place] Interventions        Food Insecurity Interventions -- Other (Comment)  [food stamps received] -- -- -- Other (Comment)  [Arthur [name] Interventions Inpatient TOC  [no housing concerns at this time] -- -- -- -- Other (Comment)   [Referral to [employer] for housing resources]  Transportation Interventions -- -- -- -- Other (Comment)  [set up delivery for BP machien] Intervention Not Indicated  Utilities Interventions -- -- Intervention Not Indicated -- -- --  Financial Strain Interventions -- -- -- Other (Comment)  [referrals placed] Intervention Not Indicated Other (Comment)  [Referral to [facility] BSW and Pharmacist]  Physical Activity Interventions -- Intervention Not Indicated -- -- -- Other (Comments)  [Cannot exercise at this time.]  Stress Interventions -- -- -- Intervention Not Indicated -- Intervention Not Indicated  Social Connections Interventions -- -- -- -- -- Intervention Not Indicated     Care Plan  Allergies  Allergen Reactions   Penicillins Hives    Did it involve swelling of the face/tongue/throat, SOB, or low BP? Y Did it involve sudden or severe rash/hives, skin peeling, or any reaction on the inside of your mouth or nose? N Did you need to seek medical attention at a hospital or doctor's office? Y When did it last happen?  Over 5 Years Ago     If all above answers are "NO", may proceed with cephalosporin use.     Medications Reviewed Today     Reviewed by Gayla Medicus, RN (Registered Nurse) on 04/25/22 at 1246  Med List Status: <None>   Medication Order Taking? Sig Documenting Provider Last Dose Status Informant  amLODipine (NORVASC) 10 MG tablet [id number] No Take 1 tablet (10 mg total) by mouth daily.  Patient not taking: Reported on 03/23/2022   Riesa Pope, MD Not Taking Active   aspirin EC 81 MG tablet [id number] No Take 1 tablet (81 mg total) by mouth daily. Swallow whole.  Patient not taking: Reported on 04/04/2022   Alethia Berthold, PA-C Not Taking Active  atorvastatin (LIPITOR) 40 MG tablet [id number] No Take 1 tablet (40 mg total) by mouth daily.  Patient not taking: Reported on 03/23/2022   Riesa Pope, MD Not Taking Active   Blood Pressure Monitor KIT [id number] No  Use to check blood pressure dailly. [name], MD Taking Active   cholecalciferol (VITAMIN D) 25 MCG (1000 UNIT) tablet [id number] No Take by mouth.  Patient not taking: Reported on 04/04/2022    Not Taking Active   hydrALAZINE (APRESOLINE) 50 MG tablet [id number] No Take 1 tablet (50 mg total) by mouth every 8 (eight) hours. [name], [name], PA-C Taking Active Spouse/Significant Other  isosorbide dinitrate (ISORDIL) 30 MG tablet [id number] No Take 1 tablet (30 mg total) by mouth 3 (three) times daily. [name], [name], PA-C Taking Active Spouse/Significant Other  lacosamide (VIMPAT) 200 MG TABS tablet [id number] No Take 1 tablet (200 mg total) by mouth 2 (two) times daily. Penumalli, Earlean Polka, MD Taking Active   levETIRAcetam (KEPPRA) 750 MG tablet [id number] No Take 2 tablets (1,500 mg total) by mouth 2 (two) times daily. Penumalli, Earlean Polka, MD Taking Active   metFORMIN (GLUCOPHAGE) 500 MG tablet [id number] No Take 2 tablets (1,000 mg total) by mouth 2 (two) times daily with a meal.  Patient not taking: Reported on 04/04/2022   [name], MD Not Taking Expired 03/06/22 2359   metoprolol tartrate (LOPRESSOR) 50 MG tablet [id number] No Take 1 tablet (50 mg total) by mouth 2 (two) times daily. [name], [name], PA-C Taking Expired 04/23/22 2359 Spouse/Significant Other  sacubitril-valsartan (ENTRESTO) 49-51 MG [id number] No Take 1 tablet by mouth 2 (two) times daily. Start on 06/29/2021 Alethia Berthold, PA-C Taking Active   Med List Note Arthur [name], CPhT 02/24/21 1756): [place]: "12/08/20 receiving PAP through [employer], approved until 12/08/2022"           Patient Active Problem List   Diagnosis Date Noted   Screening for colon cancer 12/21/2021   Vitamin D deficiency 09/07/2021   Cognitive impairment 09/06/2021   Coronary artery disease    HFrEF (heart failure with reduced ejection fraction) ([place]) 05/10/2021   Encephalopathy acute 03/11/2021   Seizures ([place]) 02/24/2021    Aortic atherosclerosis ([place]) 11/22/2020   History of seizures 11/22/2020   Alcohol use disorder, severe, dependence ([place]) 11/22/2020   Type 2 diabetes mellitus ([place]) 11/22/2020   Anemia 11/22/2020   Hyperlipidemia 11/22/2020   Acute encephalopathy 10/16/2020   Essential hypertension 10/08/2019   History of CVA (cerebrovascular accident) 10/02/2019   Conditions to be addressed/monitored per PCP order:  Chronic healthcare management needs, HTN, tobacco use, DM, seizures, HLD, h/o CVA, CAD  Care Plan : RN Care Manager Plan of Care  Updates made by Gayla Medicus, RN since 04/25/2022 12:00 AM     Problem: Chronic Disease Management and Care Coordination Needs for DMII, HTN, CAD, Seizures   Priority: High     Long-Range Goal: Development of Plan of Care for Chronic Disease Management and Care Coordination Needs (CAD, HTN, DMII, Seizures)   Start Date: 08/08/2021  Expected End Date: 07/26/2022  Priority: High  Note:   Current Barriers:  Knowledge Deficits related to plan of care for management of CAD, HTN, DMII, and Seizures  Care Coordination needs related to Financial constraints related to affordable safe housing and utility payment, Housing barriers, Medication procurement, ADL IADL limitations, Literacy concerns, Cognitive Deficits, Memory Deficits, Inability to perform IADL's independently, and Lacks knowledge of community resource: safe & affordable Section 8 Housing,  utility payment assistance and dental care providers covered by Managed Medicaid insurance.  Chronic Disease Management support and education needs related to CAD, HTN, DMII, and Seizures Financial Constraints.  Non-adherence to prescribed medication regimen Difficulty obtaining medications Cognitive Deficits No Advanced Directives in place Falls - Increased Potential for Falls 04/25/22:  patient with dental and podiatry appts coming up.  BP WNL per patient's Arthur-checks occasionally, last reading "130 over something"   per patient's Arthur-does not check blood sugar.  No seizure activity.  Difficulty remains with obtaining mediations-will send message to Pharmacist,  RNCM Clinical Goal(s):  Patient will verbalize understanding of plan for management of CAD, HTN, DMII, and Seizures as evidenced by improved management of these chronic diseases. verbalize basic understanding of CAD, HTN, DMII, and Seizures disease process and self health management plan as evidenced by noted improvement of management of these chronic diseases. take all medications exactly as prescribed and will call provider for medication related questions as evidenced by being compliant with all medications    attend all scheduled medical appointments     demonstrate improved adherence to prescribed treatment plan for CAD, HTN, DMII, and Seizures as evidenced by overall improved management of these chronic diseases continue to work with Consulting civil engineer and/or Social Worker to address care management and care coordination needs related to CAD, HTN, DMII, and Seizures as evidenced by adherence to CM Team Scheduled appointments     work with pharmacist to address Financial constraints related to obtaining medications and Medication procurement related to CAD, HTN, DMII, and Seizures as evidenced by review of EMR and patient or pharmacist report    work with Education officer, museum to address Financial constraints related to safe affordable Housing and utility payment , Housing barriers, ADL IADL limitations, Cognitive Deficits, Memory Deficits, Inability to perform IADL's independently, and Lacks knowledge of community resource: safe and affordable Section 8 Housing, utility payment assistance and dental care providers covered by [employer] insurance related to the management of CAD, HTN, DMII, and Seizures as evidenced by review of EMR and patient or Education officer, museum report     through collaboration with Consulting civil engineer, provider, and care team.    Interventions: Inter-disciplinary care team collaboration (see longitudinal plan of care) Evaluation of current treatment plan related to  self management and patient's adherence to plan as established by provider 10/03/2021:  Patient's Arthur informed this RN Care Manager that patient's disability has been denied.  Patient and Arthur recently moved from a hotel to a new home in [place].  New Address updated in patient's medical record BSW referral for Podiatry resources Collaborated with BSW. Pharmacy referral for medication assistance. Collaborated with Pharmacy  CAD  (Status: Goal on Track (progressing): YES.) Long Term Goal  Assessed understanding of CAD diagnosis Medications reviewed including medications utilized in CAD treatment plan Provided education on importance of blood pressure control in management of CAD; Counseled on the importance of exercise goals with target of 150 minutes per week Reviewed Importance of taking all medications as prescribed Reviewed Importance of attending all scheduled provider appointments Assessed social determinant of health barriers;  Diabetes:  (Status: Goal on Track (progressing): YES.) Long Term Goal  Lab Results  Component Value Date   HGBA1C 7.1 (H) 09/05/2021   HGBA1C=5.9 on 12/21/21 Assessed patient's understanding of A1c goal: <7% Provided education to patient about basic DM disease process; Reviewed medications with patient and discussed importance of medication adherence;        Discussed plans with patient  for ongoing care management follow up and provided patient with direct contact information for care management team;      Reviewed scheduled/upcoming provider appointments        Referral made to pharmacy team for assistance with complex medication regimen; [facility] Pharmacist involved in care;       Referral made to social work team for assistance with housing and utility payment assistance, dental care providers covered by Managed  Medicaid; [facility] BSW involved in care;      Assessed social determinant of health barriers;        Patient has moved to a new home with Arthur recently.  Discussed need for a glucometer now that patient has moved to new residence form hotel.   Patient's Arthur denies any episodes or any signs/symptoms of Hypoglycemia or Hyperglycemia.   Seizures  (Status: Goal on Track (progressing): YES.) Long Term Goal  Evaluation of current treatment plan related to  Seizures ,  and any seizure activity,  self-management and patient's adherence to plan as established by provider. Discussed plans with patient for ongoing care management follow up and provided patient with direct contact information for care management team Advised patient to report any seizure activity to PCP; Reviewed medications with patient and discussed importance of medication compliance; Reviewed scheduled/upcoming provider appointments including : see above in [place]; Social Work referral for [employer], Scientist, product/process development and dental care providers; Pharmacy referral for complex medication regimen; Assessed social determinant of health barriers;  Patient's Arthur reports no recent seizures.  Patient currently has all medications per patient's Arthur. Hypertension: (Status: Goal on Track (progressing): YES.) Long Term Goal  Last practice recorded BP readings:  BP Readings from Last 3 Encounters:  09/05/21 128/78  08/28/21 132/78  07/11/21 128/74     02/15/22-102/73 Most recent eGFR/CrCl:  Lab Results  Component Value Date   EGFR 92 06/06/2021    No components found for: CRCL  Evaluation of current treatment plan related to hypertension self management and patient's adherence to plan as established by provider;   Reviewed prescribed diet Low Salt, Heart Healthy Reviewed medications with patient and discussed importance of compliance;  Provided assistance with obtaining home blood pressure monitor via contacting  [place] to  follow up on Blood Pressure monitor ordered last month.  [facility] staff confirmed that blood pressure monitor is ready.  I requested they deliver the monitor to patient's new home.  [facility] will deliver the monitor.;  Counseled on the importance of exercise goals with target of 150 minutes per week Discussed plans with patient for ongoing care management follow up and provided patient with direct contact information for care management team; Reviewed scheduled/upcoming provider appointments Assessed social determinant of health barriers;   Patient Goals/Self-Care Activities: Take medications as prescribed   Attend all scheduled provider appointments Call pharmacy for medication refills 3-7 days in advance of running out of medications Call provider office for new concerns or questions  Work with the social worker to address care coordination needs and will continue to work with the clinical team to address health care and disease management related needs   Long-Range Goal: Establish Plan of Care for Chronic Disease Management Needs   Priority: High  Note:   Timeframe:  Long-Range Goal Priority:  High Start Date:    12/05/21                         Expected End Date:  ongoing  Follow Up Date 05/29/22   - schedule appointment for vaccines needed due to my age or health - schedule recommended health tests - schedule and keep appointment for annual check-up    Why is this important?   Screening tests can find diseases early when they are easier to treat.  Your doctor or nurse will talk with you about which tests are important for you.  Getting shots for common diseases like the flu and shingles will help prevent them.   04/25/22:  patient has an appt at dentist on Friday to get teeth pulled.  Has Podiatry appt scheduled for December.   Follow Up:  Patient / DPR agrees to Care Plan and Follow-up.  Plan: The Managed Medicaid care management team will reach  out to the patient / DPR again over the next 30 business  days. and The  [employer] (DPR) has been provided with contact information for the Managed Medicaid care management team and has been advised to call with any health related questions or concerns.  Date/time of next scheduled RN care management/care coordination outreach: 05/29/22 at 1230

## 2022-04-25 NOTE — Patient Instructions (Signed)
Visit Information  Mr. Tetzloff DPR  was given information about Medicaid Managed Care team care coordination services as a part of their [place] Medicaid benefit. Kamsiyochukwu A Mcclenahan/ DPR  verbally consented to engagement with the [facility] Managed Care team.   If you are experiencing a medical emergency, please call 911 or report to your local emergency department or urgent care.   If you have a non-emergency medical problem during routine business hours, please contact your provider's office and ask to speak with a nurse.   For questions related to your [facility], please call: [phone] or visit the homepage here: [email or web address]  If you would like to schedule transportation through your [facility], please call the following number at least 2 days in advance of your appointment: [phone]   Rides for urgent appointments can also be made after hours by calling Member Services.  Call the [place] at [phone], at any time, 24 hours a day, 7 days a week. If you are in danger or need immediate medical attention call 911.  If you would like help to quit smoking, call 1-800-QUIT-NOW 8470099279) OR Espaol: 1-855-Djelo-Ya (9-563-875-6433) o para ms informacin haga clic aqu or Text READY to 200-400 to register via text  Mr. Premo / DPR - following are the goals we discussed in your visit today:   Goals Addressed    Timeframe:  Long-Range Goal Priority:  High Start Date:    12/05/21                         Expected End Date:  ongoing                     Follow Up Date 05/29/22   - schedule appointment for vaccines needed due to my age or health - schedule recommended health tests - schedule and keep appointment for annual check-up    Why is this important?   Screening tests can find diseases early when they are easier to treat.   Your doctor or nurse will talk with you about which tests are important for you.  Getting shots for common diseases like the flu and shingles will help prevent them.   04/25/22:  patient has an appt at dentist on Friday to get teeth pulled.  Has Podiatry appt scheduled for December.  Patient / DPR verbalizes understanding of instructions and care plan provided today and agrees to view in [place]. Active MyChart status and patient/ DPR  understanding of how to access instructions and care plan via MyChart confirmed with patient/ DPR    The Managed Medicaid care management team will reach out to the patient / DPR again over the next 30 business  days.  The  [employer] (DPR) has been provided with contact information for the Managed Medicaid care management team and has been advised to call with any health related questions or concerns.   [name] RN, BSN [place] Management Coordinator - Managed Medicaid High Risk [phone]   Following is a copy of your plan of care:  Care Plan : [place] of Care  Updates made by Gayla Medicus, RN since 04/25/2022 12:00 AM     Problem: Chronic Disease Management and Care Coordination Needs for DMII, HTN, CAD, Seizures   Priority: High     Long-Range Goal: Development of Plan of Care  for Chronic Disease Management and Care Coordination Needs (CAD, HTN, DMII, Seizures)   Start Date: 08/08/2021  Expected End Date: 07/26/2022  Priority: High  Note:   Current Barriers:  Knowledge Deficits related to plan of care for management of CAD, HTN, DMII, and Seizures  Care Coordination needs related to Financial constraints related to affordable safe housing and utility payment, Housing barriers, Medication procurement, ADL IADL limitations, Literacy concerns, Cognitive Deficits, Memory Deficits, Inability to perform IADL's independently, and Lacks knowledge of community resource: safe & affordable  Section 8 Housing, utility payment assistance and dental care providers covered by [name].  Chronic Disease Management support and education needs related to CAD, HTN, DMII, and Seizures Financial Constraints.  Non-adherence to prescribed medication regimen Difficulty obtaining medications Cognitive Deficits No Advanced Directives in place Falls - Increased Potential for Falls 04/25/22:  patient with dental and podiatry appts coming up.  BP WNL per patient's wife-checks occasionally, last reading "130 over something"  per patient's wife-does not check blood sugar.  No seizure activity.  Difficulty remains with obtaining mediations-will send message to Pharmacist,  RNCM Clinical Goal(s):  Patient will verbalize understanding of plan for management of CAD, HTN, DMII, and Seizures as evidenced by improved management of these chronic diseases. verbalize basic understanding of CAD, HTN, DMII, and Seizures disease process and self health management plan as evidenced by noted improvement of management of these chronic diseases. take all medications exactly as prescribed and will call provider for medication related questions as evidenced by being compliant with all medications    attend all scheduled medical appointments     demonstrate improved adherence to prescribed treatment plan for CAD, HTN, DMII, and Seizures as evidenced by overall improved management of these chronic diseases continue to work with Consulting civil engineer and/or Social Worker to address care management and care coordination needs related to CAD, HTN, DMII, and Seizures as evidenced by adherence to CM Team Scheduled appointments     work with pharmacist to address Financial constraints related to obtaining medications and Medication procurement related to CAD, HTN, DMII, and Seizures as evidenced by review of EMR and patient or pharmacist report    work with Education officer, museum to address Financial constraints related to safe  affordable Housing and utility payment , Housing barriers, ADL IADL limitations, Cognitive Deficits, Memory Deficits, Inability to perform IADL's independently, and Lacks knowledge of community resource: safe and affordable Section 8 Housing, utility payment assistance and dental care providers covered by [employer] insurance related to the management of CAD, HTN, DMII, and Seizures as evidenced by review of EMR and patient or Education officer, museum report     through collaboration with Consulting civil engineer, provider, and care team.   Interventions: Inter-disciplinary care team collaboration (see longitudinal plan of care) Evaluation of current treatment plan related to  self management and patient's adherence to plan as established by provider 10/03/2021:  [name] informed this RN Care Manager that patient's disability has been denied.  Patient and wife recently moved from a hotel to a new home in [place].  New Address updated in patient's medical record BSW referral for Podiatry resources Collaborated with BSW. Pharmacy referral for medication assistance. Collaborated with Pharmacy  CAD  (Status: Goal on Track (progressing): YES.) Long Term Goal  Assessed understanding of CAD diagnosis Medications reviewed including medications utilized in CAD treatment plan Provided education on importance of blood pressure control in management of CAD; Counseled on the importance of exercise goals with target of  150 minutes per week Reviewed Importance of taking all medications as prescribed Reviewed Importance of attending all scheduled provider appointments Assessed social determinant of health barriers;  Diabetes:  (Status: Goal on Track (progressing): YES.) Long Term Goal  Lab Results  Component Value Date   HGBA1C 7.1 (H) 09/05/2021   HGBA1C=5.9 on 12/21/21 Assessed patient's understanding of A1c goal: <7% Provided education to patient about basic DM disease process; Reviewed medications with  patient and discussed importance of medication adherence;        Discussed plans with patient for ongoing care management follow up and provided patient with direct contact information for care management team;      Reviewed scheduled/upcoming provider appointments        Referral made to pharmacy team for assistance with complex medication regimen; [facility] Pharmacist involved in care;       Referral made to social work team for assistance with housing and utility payment assistance, dental care providers covered by Managed Medicaid; [employer] BSW involved in care;      Assessed social determinant of health barriers;        Patient has moved to a new home with wife recently.  Discussed need for a glucometer now that patient has moved to new residence form hotel.   [name] denies any episodes or any signs/symptoms of Hypoglycemia or Hyperglycemia.   Seizures  (Status: Goal on Track (progressing): YES.) Long Term Goal  Evaluation of current treatment plan related to  Seizures ,  and any seizure activity,  self-management and patient's adherence to plan as established by provider. Discussed plans with patient for ongoing care management follow up and provided patient with direct contact information for care management team Advised patient to report any seizure activity to PCP; Reviewed medications with patient and discussed importance of medication compliance; Reviewed scheduled/upcoming provider appointments including : see above in [place]; Social Work referral for [employer], Scientist, product/process development and dental care providers; Pharmacy referral for complex medication regimen; Assessed social determinant of health barriers;  [name] reports no recent seizures.  Patient currently has all medications per patient's wife. Hypertension: (Status: Goal on Track (progressing): YES.) Long Term Goal  Last practice recorded BP readings:  BP Readings from Last 3 Encounters:  09/05/21 128/78  08/28/21  132/78  07/11/21 128/74     02/15/22-102/73 Most recent eGFR/CrCl:  Lab Results  Component Value Date   EGFR 92 06/06/2021    No components found for: CRCL  Evaluation of current treatment plan related to hypertension self management and patient's adherence to plan as established by provider;   Reviewed prescribed diet Low Salt, Heart Healthy Reviewed medications with patient and discussed importance of compliance;  Provided assistance with obtaining home blood pressure monitor via contacting  [place] to follow up on Blood Pressure monitor ordered last month.  [facility] staff confirmed that blood pressure monitor is ready.  I requested they deliver the monitor to patient's new home.  [facility] will deliver the monitor.;  Counseled on the importance of exercise goals with target of 150 minutes per week Discussed plans with patient for ongoing care management follow up and provided patient with direct contact information for care management team; Reviewed scheduled/upcoming provider appointments Assessed social determinant of health barriers;   Patient Goals/Self-Care Activities: Take medications as prescribed   Attend all scheduled provider appointments Call pharmacy for medication refills 3-7 days in advance of running out of medications Call provider office for new concerns or questions  Work with the Education officer, museum to address care coordination needs and will continue to work with the clinical team to address health care and disease management related needs

## 2022-04-26 DIAGNOSIS — R4189 Other symptoms and signs involving cognitive functions and awareness: Secondary | ICD-10-CM | POA: Diagnosis not present

## 2022-04-27 DIAGNOSIS — R4189 Other symptoms and signs involving cognitive functions and awareness: Secondary | ICD-10-CM | POA: Diagnosis not present

## 2022-04-30 DIAGNOSIS — R4189 Other symptoms and signs involving cognitive functions and awareness: Secondary | ICD-10-CM | POA: Diagnosis not present

## 2022-05-01 DIAGNOSIS — R4189 Other symptoms and signs involving cognitive functions and awareness: Secondary | ICD-10-CM | POA: Diagnosis not present

## 2022-05-02 DIAGNOSIS — R4189 Other symptoms and signs involving cognitive functions and awareness: Secondary | ICD-10-CM | POA: Diagnosis not present

## 2022-05-07 DIAGNOSIS — R4189 Other symptoms and signs involving cognitive functions and awareness: Secondary | ICD-10-CM | POA: Diagnosis not present

## 2022-05-08 DIAGNOSIS — R4189 Other symptoms and signs involving cognitive functions and awareness: Secondary | ICD-10-CM | POA: Diagnosis not present

## 2022-05-09 DIAGNOSIS — R4189 Other symptoms and signs involving cognitive functions and awareness: Secondary | ICD-10-CM | POA: Diagnosis not present

## 2022-05-11 DIAGNOSIS — R4189 Other symptoms and signs involving cognitive functions and awareness: Secondary | ICD-10-CM | POA: Diagnosis not present

## 2022-05-14 DIAGNOSIS — R4189 Other symptoms and signs involving cognitive functions and awareness: Secondary | ICD-10-CM | POA: Diagnosis not present

## 2022-05-15 ENCOUNTER — Other Ambulatory Visit: Payer: Self-pay

## 2022-05-15 DIAGNOSIS — R4189 Other symptoms and signs involving cognitive functions and awareness: Secondary | ICD-10-CM | POA: Diagnosis not present

## 2022-05-17 DIAGNOSIS — R4189 Other symptoms and signs involving cognitive functions and awareness: Secondary | ICD-10-CM | POA: Diagnosis not present

## 2022-05-22 DIAGNOSIS — R4189 Other symptoms and signs involving cognitive functions and awareness: Secondary | ICD-10-CM | POA: Diagnosis not present

## 2022-05-23 ENCOUNTER — Ambulatory Visit: Payer: Medicaid Other | Admitting: Podiatry

## 2022-05-25 ENCOUNTER — Ambulatory Visit: Payer: Medicaid Other | Admitting: Podiatry

## 2022-05-28 DIAGNOSIS — R4189 Other symptoms and signs involving cognitive functions and awareness: Secondary | ICD-10-CM | POA: Diagnosis not present

## 2022-05-29 ENCOUNTER — Encounter: Payer: Self-pay | Admitting: Obstetrics and Gynecology

## 2022-05-29 ENCOUNTER — Other Ambulatory Visit: Payer: Self-pay | Admitting: Internal Medicine

## 2022-05-29 ENCOUNTER — Other Ambulatory Visit: Payer: Medicaid Other | Admitting: Obstetrics and Gynecology

## 2022-05-29 DIAGNOSIS — R4189 Other symptoms and signs involving cognitive functions and awareness: Secondary | ICD-10-CM | POA: Diagnosis not present

## 2022-05-29 NOTE — Patient Instructions (Signed)
Visit Information  Arthur [name] Arthur [name] was given information about Medicaid Managed Care team care coordination services as a part of their [place] Medicaid benefit. Arthur [name] / Arthur [name] consented to engagement with the [facility] Managed Care team.   If you are experiencing a medical emergency, please call 911 or report to your local emergency department or urgent care.   If you have a non-emergency medical problem during routine business hours, please contact your provider's office and ask to speak with a nurse.   For questions related to your [facility], please call: [phone] or visit the homepage here: [email or web address]  If you would like to schedule transportation through your [facility], please call the following number at least 2 days in advance of your appointment: [phone]   Rides for urgent appointments can also be made after hours by calling Member Services.  Call the [place] at [phone], at any time, 24 hours a day, 7 days a week. If you are in danger or need immediate medical attention call 911.  If you would like help to quit smoking, call 1-800-QUIT-NOW 205-261-0216) OR Espaol: 1-855-Djelo-Ya (9-179-150-5697) o para ms informacin haga clic aqu or Text READY to 200-400 to register via text  Arthur [name] Arthur [name]  - following are the goals we discussed in your visit today:   Goals Addressed    Timeframe:  Long-Range Goal Priority:  High Start Date:    12/05/21                         Expected End Date:  ongoing                     Follow Up Date 06/25/22   - schedule appointment for vaccines needed due to my age or health - schedule recommended health tests - schedule and keep appointment for annual check-up    Why is this important?   Screening tests can find diseases early when  they are easier to treat.  Your doctor or nurse will talk with you about which tests are important for you.  Getting shots for common diseases like the flu and shingles will help prevent them.   05/29/22:  Patient has an appt 07/05/22 with Podiatry.  Patient / [name] verbalizes understanding of instructions and care plan provided today and agrees to view in [place]. Active MyChart status and patient / patient's wife understanding of how to access instructions and care plan via MyChart confirmed with patient/patient's wife    The Managed Medicaid care management team will reach out to the patient/ patient's wife  again over the next 60 business  days.  The  [employer] (DPR) has been provided with contact information for the Managed Medicaid care management team and has been advised to call with any health related questions or concerns.   [name] RN, BSN [place]  [place] Curator - Managed Medicaid High Risk [phone].   Following is a copy of your plan of care:  Care Plan : [place] of Care  Updates made by Gayla Medicus, RN since 05/29/2022 12:00 AM     Problem: Chronic Disease Management and Care Coordination Needs for DMII, HTN, CAD, Seizures   Priority: High     Long-Range Goal: Development of Plan of Care for Chronic Disease Management and Care Coordination Needs (  CAD, HTN, DMII, Seizures)   Start Date: 08/08/2021  Expected End Date: 07/26/2022  Priority: High  Note:   Current Barriers:  Knowledge Deficits related to plan of care for management of CAD, HTN, DMII, and Seizures, tobacco use, HLD, h/o CVA Care Coordination needs related to Financial constraints related to affordable safe housing and utility payment, Housing barriers, Medication procurement, ADL IADL limitations, Literacy concerns, Cognitive Deficits, Memory Deficits, Inability to perform IADL's independently, and Lacks knowledge of community  resource: safe & affordable Section 8 Housing, utility payment assistance and dental care providers covered by [name].  Chronic Disease Management support and education needs related to CAD, HTN, DMII, and Seizures, tobacco use, HLD, h/o CVA Film/video editor.  Non-adherence to prescribed medication regimen Difficulty obtaining medications Cognitive Deficits No Advanced Directives in place Falls - Increased Potential for Falls 05/29/22:  Par patient's wife, patient has all meds now.  Discussed smoking cessation with patient's wife-she will f/u with provider/pharmacy.  BP "good" per patient's wife-no readings available.  Does not check BS.  Podiatry appt 07/05/22   RNCM Clinical Goal(s):  Patient will verbalize understanding of plan for management of CAD, HTN, DMII, and Seizures as evidenced by improved management of these chronic diseases. verbalize basic understanding of CAD, HTN, DMII, and Seizures disease process and self health management plan as evidenced by noted improvement of management of these chronic diseases. take all medications exactly as prescribed and will call provider for medication related questions as evidenced by being compliant with all medications    attend all scheduled medical appointments     demonstrate improved adherence to prescribed treatment plan for CAD, HTN, DMII, and Seizures as evidenced by overall improved management of these chronic diseases continue to work with Consulting civil engineer and/or Social Worker to address care management and care coordination needs related to CAD, HTN, DMII, and Seizures as evidenced by adherence to CM Team Scheduled appointments     work with pharmacist to address Financial constraints related to obtaining medications and Medication procurement related to CAD, HTN, DMII, and Seizures as evidenced by review of EMR and patient or pharmacist report    work with Education officer, museum to address Financial constraints related to safe  affordable Housing and utility payment , Housing barriers, ADL IADL limitations, Cognitive Deficits, Memory Deficits, Inability to perform IADL's independently, and Lacks knowledge of community resource: safe and affordable Section 8 Housing, utility payment assistance and dental care providers covered by [employer] insurance related to the management of CAD, HTN, DMII, and Seizures as evidenced by review of EMR and patient or Education officer, museum report     through collaboration with Consulting civil engineer, provider, and care team.   Interventions: Inter-disciplinary care team collaboration (see longitudinal plan of care) Evaluation of current treatment plan related to  self management and patient's adherence to plan as established by provider 10/03/2021:  [name] informed this RN Care Manager that patient's disability has been denied.  Patient and wife recently moved from a hotel to a new home in [place].  New Address updated in patient's medical record BSW referral for Podiatry resources-completed Collaborated with BSW. Pharmacy referral for medication assistance-completed Collaborated with Pharmacy  CAD  (Status: Goal on Track (progressing): YES.) Long Term Goal  Assessed understanding of CAD diagnosis Medications reviewed including medications utilized in CAD treatment plan Provided education on importance of blood pressure control in management of CAD; Counseled on the importance of exercise goals with target of 150 minutes per week  Reviewed Importance of taking all medications as prescribed Reviewed Importance of attending all scheduled provider appointments Assessed social determinant of health barriers;  Diabetes:  (Status: Goal on Track (progressing): YES.) Long Term Goal  Lab Results  Component Value Date   HGBA1C 7.1 (H) 09/05/2021   HGBA1C=5.9 on 12/21/21 Assessed patient's understanding of A1c goal: <7% Provided education to patient about basic DM disease process; Reviewed  medications with patient and discussed importance of medication adherence;        Discussed plans with patient for ongoing care management follow up and provided patient with direct contact information for care management team;      Reviewed scheduled/upcoming provider appointments        Referral made to pharmacy team for assistance with complex medication regimen; [facility] Pharmacist involved in care;       Referral made to social work team for assistance with housing and utility payment assistance, dental care providers covered by Managed Medicaid; [employer] BSW involved in care;      Assessed social determinant of health barriers;        Patient has moved to a new home with wife recently.  Discussed need for a glucometer now that patient has moved to new residence form hotel.   [name] denies any episodes or any signs/symptoms of Hypoglycemia or Hyperglycemia.   Seizures  (Status: Goal on Track (progressing): YES.) Long Term Goal  Evaluation of current treatment plan related to  Seizures ,  and any seizure activity,  self-management and patient's adherence to plan as established by provider. Discussed plans with patient for ongoing care management follow up and provided patient with direct contact information for care management team Advised patient to report any seizure activity to PCP; Reviewed medications with patient and discussed importance of medication compliance; Reviewed scheduled/upcoming provider appointments  Social Work referral for Housing, utility payment and dental care providers-completed Pharmacy referral for complex medication regimen; Assessed social determinant of health barriers;  [name] reports no recent seizures.  Patient currently has all medications per patient's wife.  Hypertension: (Status: Goal on Track (progressing): YES.) Long Term Goal  Last practice recorded BP readings:  BP Readings from Last 3 Encounters:  09/05/21 128/78  08/28/21 132/78  07/11/21  128/74     02/15/22-102/73 Most recent eGFR/CrCl:  Lab Results  Component Value Date   EGFR 92 06/06/2021    No components found for: CRCL  Evaluation of current treatment plan related to hypertension self management and patient's adherence to plan as established by provider;   Reviewed prescribed diet Low Salt, Heart Healthy Reviewed medications with patient and discussed importance of compliance;  Provided assistance with obtaining home blood pressure monitor via contacting  [place] to follow up on Blood Pressure monitor ordered last month.  [facility] staff confirmed that blood pressure monitor is ready.  I requested they deliver the monitor to patient's new home.  [facility] will deliver the monitor.;  Counseled on the importance of exercise goals with target of 150 minutes per week Discussed plans with patient for ongoing care management follow up and provided patient with direct contact information for care management team; Reviewed scheduled/upcoming provider appointments Assessed social determinant of health barriers;   Patient Goals/Self-Care Activities: Take medications as prescribed   Attend all scheduled provider appointments Call pharmacy for medication refills 3-7 days in advance of running out of medications Call provider office for new concerns or questions  Work with the social worker to address care coordination  needs and will continue to work with the clinical team to address health care and disease management related needs

## 2022-05-29 NOTE — Patient Outreach (Signed)
Medicaid Managed Care   Nurse Care Manager Note  05/29/2022 Name:  Arthur [name] MRN:  [id number] DOB:  [birth year]  SIRIS HOOS is an 62 y.o. year old male who is a primary patient of Arthur Aurora, DO.  The [facility] Managed Care Coordination team was consulted for assistance with:    Chronic healthcare management needs, HTN, DM, tobacco use, HLD, CAD, h/o CVA, seizures  Arthur [name] was given information about Medicaid Managed Care Coordination team services today. Arthur [name] Patient agreed to services and verbal consent obtained.  Engaged with patient by telephone for follow up visit in response to provider referral for case management and/or care coordination services.   Assessments/Interventions:  Review of past medical history, allergies, medications, health status, including review of consultants reports, laboratory and other test data, was performed as part of comprehensive evaluation and provision of chronic care management services.  SDOH (Social Determinants of Health) assessments and interventions performed: SDOH Interventions    Flowsheet Row Patient Outreach Telephone from 05/29/2022 in [place] Patient Outreach Telephone from 04/25/2022 in [place] Patient Outreach Telephone from 03/23/2022 in [place] Patient Outreach Telephone from 02/16/2022 in [place] Patient Outreach Telephone from 01/08/2022 in [place] Patient Outreach Telephone from 10/19/2021 in [place] Interventions        Food Insecurity Interventions -- -- Other (Comment)  [food stamps received] -- -- --  Housing Interventions -- Inpatient TOC  [no housing concerns at this time] -- -- -- --  Transportation Interventions Intervention Not Indicated -- -- -- -- Other (Comment)  [set up  delivery for BP machien]  Utilities Interventions -- -- -- Intervention Not Indicated -- --  Financial Strain Interventions -- -- -- -- Other (Comment)  [referrals placed] Intervention Not Indicated  Physical Activity Interventions -- -- Intervention Not Indicated -- -- --  Stress Interventions -- -- -- -- Intervention Not Indicated --     Care Plan  Allergies  Allergen Reactions   Penicillins Hives    Did it involve swelling of the face/tongue/throat, SOB, or low BP? Y Did it involve sudden or severe rash/hives, skin peeling, or any reaction on the inside of your mouth or nose? N Did you need to seek medical attention at a hospital or doctor's office? Y When did it last happen?  Over 5 Years Ago     If all above answers are "NO", may proceed with cephalosporin use.     Medications Reviewed Today     Reviewed by Gayla Medicus, RN (Registered Nurse) on 05/29/22 at 1227  Med List Status: <None>   Medication Order Taking? Sig Documenting Provider Last Dose Status Informant  amLODipine (NORVASC) 10 MG tablet [id number]  Take 1 tablet (10 mg total) by mouth daily. Nigel Mormon, MD  Active   aspirin EC 81 MG tablet [id number] No Take 1 tablet (81 mg total) by mouth daily. Swallow whole.  Patient not taking: Reported on 04/04/2022   Alethia Berthold, [place] Not Taking Active   aspirin EC 81 MG tablet [id number]  Take 1 tablet (81 mg total) by mouth daily. Swallow whole. Patwardhan, Reynold Bowen, MD  Active   atorvastatin (LIPITOR) 40 MG tablet [id number]  Take 1 tablet (40 mg total) by mouth daily. Nigel Mormon, MD  Active   Blood Pressure Monitor KIT [id number] No Use  to check blood pressure dailly. [name], MD Taking Active   cholecalciferol (VITAMIN D) 25 MCG (1000 UNIT) tablet [id number] No Take by mouth.  Patient not taking: Reported on 04/04/2022    Not Taking Active   hydrALAZINE (APRESOLINE) 50 MG tablet [id number]  Take 1 tablet (50 mg total) by mouth every 8 (eight)  hours. Patwardhan, Reynold Bowen, MD  Active   isosorbide dinitrate (ISORDIL) 30 MG tablet [id number]  Take 1 tablet (30 mg total) by mouth 3 (three) times daily. Patwardhan, Reynold Bowen, MD  Active   lacosamide (VIMPAT) 200 MG TABS tablet [id number]  Take 1 tablet (200 mg total) by mouth 2 (two) times daily. Penumalli, Earlean Polka, MD  Active   levETIRAcetam (KEPPRA) 750 MG tablet [id number]  Take 2 tablets (1,500 mg total) by mouth 2 (two) times daily. Penumalli, Earlean Polka, MD  Active   metFORMIN (GLUCOPHAGE) 500 MG tablet [id number] No Take 2 tablets (1,000 mg total) by mouth 2 (two) times daily with a meal.  Patient not taking: Reported on 04/04/2022   [name], MD Not Taking Expired 03/06/22 2359   metoprolol tartrate (LOPRESSOR) 50 MG tablet [id number]  Take 1 tablet (50 mg total) by mouth 2 (two) times daily. Patwardhan, Reynold Bowen, MD  Active   sacubitril-valsartan (ENTRESTO) 49-51 MG [id number]  Take 1 tablet by mouth 2 (two) times daily. Start on 06/29/2021 Nigel Mormon, MD  Active   Med List Note Arthur [name], CPhT 02/24/21 1756): [place]: "12/08/20 receiving PAP through [employer], approved until 12/08/2022"           Patient Active Problem List   Diagnosis Date Noted   Screening for colon cancer 12/21/2021   Vitamin D deficiency 09/07/2021   Cognitive impairment 09/06/2021   Coronary artery disease    HFrEF (heart failure with reduced ejection fraction) ([place]) 05/10/2021   Encephalopathy acute 03/11/2021   Seizures ([place]) 02/24/2021   Aortic atherosclerosis ([place]) 11/22/2020   History of seizures 11/22/2020   Alcohol use disorder, severe, dependence ([place]) 11/22/2020   Type 2 diabetes mellitus ([place]) 11/22/2020   Anemia 11/22/2020   Hyperlipidemia 11/22/2020   Acute encephalopathy 10/16/2020   Essential hypertension 10/08/2019   History of CVA (cerebrovascular accident) 10/02/2019   Conditions to be addressed/monitored per PCP order:  Chronic healthcare management needs, HTN, DM,  tobacco use, HLD, CAD, h/o CVA, seizures  Care Plan : RN Care Manager Plan of Care  Updates made by Gayla Medicus, RN since 05/29/2022 12:00 AM     Problem: Chronic Disease Management and Care Coordination Needs for DMII, HTN, CAD, Seizures   Priority: High     Long-Range Goal: Development of Plan of Care for Chronic Disease Management and Care Coordination Needs (CAD, HTN, DMII, Seizures)   Start Date: 08/08/2021  Expected End Date: 07/26/2022  Priority: High  Note:   Current Barriers:  Knowledge Deficits related to plan of care for management of CAD, HTN, DMII, and Seizures, tobacco use, HLD, h/o CVA Care Coordination needs related to Financial constraints related to affordable safe housing and utility payment, Housing barriers, Medication procurement, ADL IADL limitations, Literacy concerns, Cognitive Deficits, Memory Deficits, Inability to perform IADL's independently, and Lacks knowledge of community resource: safe & affordable Section 8 Housing, utility payment assistance and dental care providers covered by [name].  Chronic Disease Management support and education needs related to CAD, HTN, DMII, and Seizures, tobacco use, HLD, h/o CVA Film/video editor.  Non-adherence to prescribed medication regimen Difficulty  obtaining medications Cognitive Deficits No Advanced Directives in place Falls - Increased Potential for Falls 05/29/22:  Par patient's wife, patient has all meds now.  Discussed smoking cessation with patient's wife-she will f/u with provider/pharmacy.  BP "good" per patient's wife-no readings available.  Does not check BS.  Podiatry appt 07/05/22   RNCM Clinical Goal(s):  Patient will verbalize understanding of plan for management of CAD, HTN, DMII, and Seizures as evidenced by improved management of these chronic diseases. verbalize basic understanding of CAD, HTN, DMII, and Seizures disease process and self health management plan as evidenced by noted  improvement of management of these chronic diseases. take all medications exactly as prescribed and will call provider for medication related questions as evidenced by being compliant with all medications    attend all scheduled medical appointments     demonstrate improved adherence to prescribed treatment plan for CAD, HTN, DMII, and Seizures as evidenced by overall improved management of these chronic diseases continue to work with Consulting civil engineer and/or Social Worker to address care management and care coordination needs related to CAD, HTN, DMII, and Seizures as evidenced by adherence to CM Team Scheduled appointments     work with pharmacist to address Financial constraints related to obtaining medications and Medication procurement related to CAD, HTN, DMII, and Seizures as evidenced by review of EMR and patient or pharmacist report    work with Education officer, museum to address Financial constraints related to safe affordable Housing and utility payment , Housing barriers, ADL IADL limitations, Cognitive Deficits, Memory Deficits, Inability to perform IADL's independently, and Lacks knowledge of community resource: safe and affordable Section 8 Housing, utility payment assistance and dental care providers covered by [employer] insurance related to the management of CAD, HTN, DMII, and Seizures as evidenced by review of EMR and patient or Education officer, museum report     through collaboration with Consulting civil engineer, provider, and care team.   Interventions: Inter-disciplinary care team collaboration (see longitudinal plan of care) Evaluation of current treatment plan related to  self management and patient's adherence to plan as established by provider 10/03/2021:  [name] informed this RN Care Manager that patient's disability has been denied.  Patient and wife recently moved from a hotel to a new home in [place].  New Address updated in patient's medical record BSW referral for Podiatry  resources-completed Collaborated with BSW. Pharmacy referral for medication assistance-completed Collaborated with Pharmacy  CAD  (Status: Goal on Track (progressing): YES.) Long Term Goal  Assessed understanding of CAD diagnosis Medications reviewed including medications utilized in CAD treatment plan Provided education on importance of blood pressure control in management of CAD; Counseled on the importance of exercise goals with target of 150 minutes per week Reviewed Importance of taking all medications as prescribed Reviewed Importance of attending all scheduled provider appointments Assessed social determinant of health barriers;  Diabetes:  (Status: Goal on Track (progressing): YES.) Long Term Goal  Lab Results  Component Value Date   HGBA1C 7.1 (H) 09/05/2021   HGBA1C=5.9 on 12/21/21 Assessed patient's understanding of A1c goal: <7% Provided education to patient about basic DM disease process; Reviewed medications with patient and discussed importance of medication adherence;        Discussed plans with patient for ongoing care management follow up and provided patient with direct contact information for care management team;      Reviewed scheduled/upcoming provider appointments        Referral made to pharmacy team for assistance with  complex medication regimen; [facility] Pharmacist involved in care;       Referral made to social work team for assistance with housing and utility payment assistance, dental care providers covered by Managed Medicaid; [facility] BSW involved in care;      Assessed social determinant of health barriers;        Patient has moved to a new home with wife recently.  Discussed need for a glucometer now that patient has moved to new residence form hotel.   [name] denies any episodes or any signs/symptoms of Hypoglycemia or Hyperglycemia.   Seizures  (Status: Goal on Track (progressing): YES.) Long Term Goal  Evaluation of current treatment plan related to   Seizures ,  and any seizure activity,  self-management and patient's adherence to plan as established by provider. Discussed plans with patient for ongoing care management follow up and provided patient with direct contact information for care management team Advised patient to report any seizure activity to PCP; Reviewed medications with patient and discussed importance of medication compliance; Reviewed scheduled/upcoming provider appointments  Social Work referral for Housing, utility payment and dental care providers-completed Pharmacy referral for complex medication regimen; Assessed social determinant of health barriers;  [name] reports no recent seizures.  Patient currently has all medications per patient's wife.  Hypertension: (Status: Goal on Track (progressing): YES.) Long Term Goal  Last practice recorded BP readings:  BP Readings from Last 3 Encounters:  09/05/21 128/78  08/28/21 132/78  07/11/21 128/74     02/15/22-102/73 Most recent eGFR/CrCl:  Lab Results  Component Value Date   EGFR 92 06/06/2021    No components found for: CRCL  Evaluation of current treatment plan related to hypertension self management and patient's adherence to plan as established by provider;   Reviewed prescribed diet Low Salt, Heart Healthy Reviewed medications with patient and discussed importance of compliance;  Provided assistance with obtaining home blood pressure monitor via contacting  [facility] to follow up on Blood Pressure monitor ordered last month.  [facility] staff confirmed that blood pressure monitor is ready.  I requested they deliver the monitor to patient's new home.  [facility] will deliver the monitor.;  Counseled on the importance of exercise goals with target of 150 minutes per week Discussed plans with patient for ongoing care management follow up and provided patient with direct contact information for care management team; Reviewed scheduled/upcoming  provider appointments Assessed social determinant of health barriers;   Patient Goals/Self-Care Activities: Take medications as prescribed   Attend all scheduled provider appointments Call pharmacy for medication refills 3-7 days in advance of running out of medications Call provider office for new concerns or questions  Work with the social worker to address care coordination needs and will continue to work with the clinical team to address health care and disease management related needs    Long-Range Goal: Establish Plan of Care for Chronic Disease Management Needs   Priority: High  Note:   Timeframe:  Long-Range Goal Priority:  High Start Date:    12/05/21                         Expected End Date:  ongoing                     Follow Up Date 06/25/22   - schedule appointment for vaccines needed due to my age or health - schedule recommended health tests - schedule and keep  appointment for annual check-up    Why is this important?   Screening tests can find diseases early when they are easier to treat.  Your doctor or nurse will talk with you about which tests are important for you.  Getting shots for common diseases like the flu and shingles will help prevent them.   05/29/22:  Patient has an appt 07/05/22 with Podiatry.   Follow Up:  Patient agrees to Care Plan and Follow-up.  Plan: The Managed Medicaid care management team will reach out to the patient again over the next 30 business  days. and The  [employer] has been provided with contact information for the Managed Medicaid care management team and has been advised to call with any health related questions or concerns.  Date/time of next scheduled RN care management/care coordination outreach:  06/25/22 at 0900.

## 2022-05-31 DIAGNOSIS — R4189 Other symptoms and signs involving cognitive functions and awareness: Secondary | ICD-10-CM | POA: Diagnosis not present

## 2022-06-01 DIAGNOSIS — R4189 Other symptoms and signs involving cognitive functions and awareness: Secondary | ICD-10-CM | POA: Diagnosis not present

## 2022-06-05 DIAGNOSIS — R4189 Other symptoms and signs involving cognitive functions and awareness: Secondary | ICD-10-CM | POA: Diagnosis not present

## 2022-06-06 ENCOUNTER — Other Ambulatory Visit: Payer: Medicaid Other

## 2022-06-06 NOTE — Patient Outreach (Signed)
  Medicaid Managed Care   Unsuccessful Outreach Note  06/06/2022 Name: Nickolas A Dobek MRN: [id number] DOB: 07/21/1960  Referred by: Patel, Amar, DO Reason for referral : High Risk Managed Medicaid (MM social work unsuccessful telephone outreach )   A second unsuccessful telephone outreach was attempted today. The patient was referred to the case management team for assistance with care management and care coordination.   Follow Up Plan: The care management team will reach out to the patient again over the next 30 days.   Emmanuelle Hibbitts, BSW, MHA [facility]  [place]  High Risk Managed Medicaid Team  [phone]  

## 2022-06-06 NOTE — Patient Instructions (Signed)
  Medicaid Managed Care   Unsuccessful Outreach Note  06/06/2022 Name: LAMONT TANT MRN: [id number] DOB: 10-Feb-1961  Referred by: [name], DO Reason for referral : High Risk Managed Medicaid (MM social work unsuccessful telephone outreach )   A second unsuccessful telephone outreach was attempted today. The patient was referred to the case management team for assistance with care management and care coordination.   Follow Up Plan: The care management team will reach out to the patient again over the next 30 days.   [name], BSW, [place] Managed Medicaid Team  [phone]

## 2022-06-07 DIAGNOSIS — R4189 Other symptoms and signs involving cognitive functions and awareness: Secondary | ICD-10-CM | POA: Diagnosis not present

## 2022-06-08 DIAGNOSIS — R4189 Other symptoms and signs involving cognitive functions and awareness: Secondary | ICD-10-CM | POA: Diagnosis not present

## 2022-06-12 DIAGNOSIS — R4189 Other symptoms and signs involving cognitive functions and awareness: Secondary | ICD-10-CM | POA: Diagnosis not present

## 2022-06-14 DIAGNOSIS — R4189 Other symptoms and signs involving cognitive functions and awareness: Secondary | ICD-10-CM | POA: Diagnosis not present

## 2022-06-15 DIAGNOSIS — R4189 Other symptoms and signs involving cognitive functions and awareness: Secondary | ICD-10-CM | POA: Diagnosis not present

## 2022-06-18 DIAGNOSIS — R4189 Other symptoms and signs involving cognitive functions and awareness: Secondary | ICD-10-CM | POA: Diagnosis not present

## 2022-06-19 DIAGNOSIS — R4189 Other symptoms and signs involving cognitive functions and awareness: Secondary | ICD-10-CM | POA: Diagnosis not present

## 2022-06-20 DIAGNOSIS — R4189 Other symptoms and signs involving cognitive functions and awareness: Secondary | ICD-10-CM | POA: Diagnosis not present

## 2022-06-22 DIAGNOSIS — R4189 Other symptoms and signs involving cognitive functions and awareness: Secondary | ICD-10-CM | POA: Diagnosis not present

## 2022-06-25 ENCOUNTER — Encounter: Payer: Self-pay | Admitting: Obstetrics and Gynecology

## 2022-06-25 ENCOUNTER — Other Ambulatory Visit: Payer: Medicaid Other | Admitting: Obstetrics and Gynecology

## 2022-06-25 NOTE — Patient Instructions (Signed)
Visit Information  [name] / [name] was given information about Medicaid Managed Care team care coordination services as a part of their [place] Medicaid benefit. Arthur [name] / [name] verbally consented to engagement with the [facility] Managed Care team.   If you are experiencing a medical emergency, please call 911 or report to your local emergency department or urgent care.   If you have a non-emergency medical problem during routine business hours, please contact your provider's office and ask to speak with a nurse.   For questions related to your [facility], please call: [phone] or visit the homepage here: [email or web address]  If you would like to schedule transportation through your [facility], please call the following number at least 2 days in advance of your appointment: [phone]   Rides for urgent appointments can also be made after hours by calling Member Services.  Call the [place] at [phone], at any time, 24 hours a day, 7 days a week. If you are in danger or need immediate medical attention call 911.  If you would like help to quit smoking, call 1-800-QUIT-NOW 228-351-8917) OR Espaol: 1-855-Djelo-Ya (3-474-259-5638) o para ms informacin haga clic aqu or Text READY to 200-400 to register via text  [name] / [name] - following are the goals we discussed in your visit today:   Goals Addressed    Timeframe:  Long-Range Goal Priority:  High Start Date:    12/05/21                         Expected End Date:  ongoing                     Follow Up Date 07/23/22   - schedule appointment for vaccines needed due to my age or health - schedule recommended health tests - schedule and keep appointment for annual check-up    Why is this important?   Screening tests can find diseases early when  they are easier to treat.  Your doctor or nurse will talk with you about which tests are important for you.  Getting shots for common diseases like the flu and shingles will help prevent them.   06/25/22:  Patient has an appt 07/05/22 with Podiatry and 07/10/22 with PCP  Patient / DPR verbalizes understanding of instructions and care plan provided today and agrees to view in [place]. Active MyChart status and patient / DPR understanding of how to access instructions and care plan via MyChart confirmed with patient/DPR    The Managed Medicaid care management team will reach out to the patient / DPR again over the next 30 business  days.  The  [employer] (DPR) has been provided with contact information for the Managed Medicaid care management team and has been advised to call with any health related questions or concerns.   [name] RN, BSN [place] Management Coordinator - Managed Medicaid High Risk [phone]   Following is a copy of your plan of care:  Care Plan : [place] of Care  Updates made by Gayla Medicus, RN since 06/25/2022 12:00 AM     Problem: Chronic Disease Management and Care Coordination Needs for DMII, HTN, CAD, Seizures   Priority: High     Long-Range Goal: Development of Plan of Care for Chronic Disease Management and Care  Coordination Needs (CAD, HTN, DMII, Seizures)   Start Date: 08/08/2021  Expected End Date: 07/26/2022  Priority: High  Note:   Current Barriers:  Knowledge Deficits related to plan of care for management of CAD, HTN, DMII, and Seizures, tobacco use, HLD, h/o CVA Care Coordination needs related to Financial constraints related to affordable safe housing and utility payment, Housing barriers, Medication procurement, ADL IADL limitations, Literacy concerns, Cognitive Deficits, Memory Deficits, Inability to perform IADL's independently, and Lacks knowledge of community resource: safe &  affordable Section 8 Housing, utility payment assistance and dental care providers covered by [employer]  Chronic Disease Management support and education needs related to CAD, HTN, DMII, and Seizures, tobacco use, HLD, h/o CVA Corporate treasurer.  Non-adherence to prescribed medication regimen Difficulty obtaining medications Cognitive Deficits No Advanced Directives in place Falls - Increased Potential for Falls 06/25/22:  No issues today per patient's wife-DPR.  Does not check BP or BG.  Continues to smoke 3 cigars a day.  Is appealing disability denial.  Has all meds and taking all meds per patient's wife, DPR.    RNCM Clinical Goal(s):  Patient will verbalize understanding of plan for management of CAD, HTN, DMII, and Seizures as evidenced by improved management of these chronic diseases. verbalize basic understanding of CAD, HTN, DMII, and Seizures disease process and self health management plan as evidenced by noted improvement of management of these chronic diseases. take all medications exactly as prescribed and will call provider for medication related questions as evidenced by being compliant with all medications    attend all scheduled medical appointments     demonstrate improved adherence to prescribed treatment plan for CAD, HTN, DMII, and Seizures as evidenced by overall improved management of these chronic diseases continue to work with Medical illustrator and/or Social Worker to address care management and care coordination needs related to CAD, HTN, DMII, and Seizures as evidenced by adherence to CM Team Scheduled appointments     work with pharmacist to address Financial constraints related to obtaining medications and Medication procurement related to CAD, HTN, DMII, and Seizures as evidenced by review of EMR and patient or pharmacist report    work with Child psychotherapist to address Financial constraints related to safe affordable Housing and utility payment ,  Housing barriers, ADL IADL limitations, Cognitive Deficits, Memory Deficits, Inability to perform IADL's independently, and Lacks knowledge of community resource: safe and affordable Section 8 Housing, utility payment assistance and dental care providers covered by [employer] insurance related to the management of CAD, HTN, DMII, and Seizures as evidenced by review of EMR and patient or Child psychotherapist report through collaboration with Medical illustrator, provider, and care team.   Interventions: Inter-disciplinary care team collaboration (see longitudinal plan of care) Evaluation of current treatment plan related to  self management and patient's adherence to plan as established by provider BSW referral for Podiatry resources-completed Collaborated with BSW. Pharmacy referral for medication assistance-completed Collaborated with Pharmacy  CAD  (Status: Goal on Track (progressing): YES.) Long Term Goal  Assessed understanding of CAD diagnosis Medications reviewed including medications utilized in CAD treatment plan Provided education on importance of blood pressure control in management of CAD; Counseled on the importance of exercise goals with target of 150 minutes per week Reviewed Importance of taking all medications as prescribed Reviewed Importance of attending all scheduled provider appointments Assessed social determinant of health barriers;  Diabetes:  (Status: Goal on Track (progressing): YES.) Long Term Goal  Lab Results  Component Value Date   HGBA1C 7.1 (H) 09/05/2021   HGBA1C=5.9 on 12/21/21 Assessed patient's / DPR's understanding of A1c goal: <7% Provided education to patient / DPR about basic DM disease process; Reviewed medications with patient and discussed importance of medication adherence;        Discussed plans with patient / DPR for ongoing care management follow up and provided patient / DPR with direct contact information for care management team;      Reviewed  scheduled/upcoming provider appointments        Referral made to pharmacy team for assistance with complex medication regimen; [facility] Pharmacist involved in care;       Referral made to social work team for assistance with housing and utility payment assistance, dental care providers covered by Managed Medicaid; [facility] BSW involved in care;      Assessed social determinant of health barriers;        [name] denies any episodes or any signs/symptoms of Hypoglycemia or Hyperglycemia.   Seizures  (Status: Goal on Track (progressing): YES.) Long Term Goal  Evaluation of current treatment plan related to  Seizures ,  and any seizure activity,  self-management and patient's adherence to plan as established by provider. Discussed plans with patient for ongoing care management follow up and provided patient / DPR with direct contact information for care management team Advised patient / DPR to report any seizure activity to PCP; Reviewed medications with patient and discussed importance of medication compliance; Reviewed scheduled/upcoming provider appointments  Social Work referral for Housing, utility payment and dental care providers-completed Pharmacy referral for complex medication regimen; Assessed social determinant of health barriers;  [name] reports no recent seizures.  Patient currently has all medications per patient's wife.  Hypertension: (Status: Goal on Track (progressing): YES.) Long Term Goal  Last practice recorded BP readings:  BP Readings from Last 3 Encounters:  09/05/21 128/78  08/28/21 132/78  07/11/21 128/74     02/15/22-102/73 Most recent eGFR/CrCl:  Lab Results  Component Value Date   EGFR 92 06/06/2021    No components found for: CRCL  Evaluation of current treatment plan related to hypertension self management and patient's adherence to plan as established by provider;   Reviewed prescribed diet Low Salt, Heart Healthy Reviewed medications with patient / DPR  and discussed importance of compliance  Provided assistance with obtaining home blood pressure monitor via contacting  [facility].  [facility] staff confirmed that blood pressure monitor is ready.  I requested they deliver the monitor to patient's new home.  [facility] will deliver the monitor.;  Counseled on the importance of exercise goals with target of 150 minutes per week Discussed plans with patient for ongoing care management follow up and provided patient / DPR with direct contact information for care management team; Reviewed scheduled/upcoming provider appointments Assessed social determinant of health barriers;   Patient Goals/Self-Care Activities: Take medications as prescribed   Attend all scheduled provider appointments Call pharmacy for medication refills 3-7 days in advance of running out of medications Call provider office for new concerns or questions  Work with the social worker to address care coordination needs and will continue to work with the clinical team to address health care and disease management related needs

## 2022-06-25 NOTE — Patient Outreach (Signed)
Medicaid Managed Care   Nurse Care Manager Note  06/25/2022 Name:  Arthur [name] MRN:  [id number] DOB:  25-Apr-1961  Arthur [name] is an 62 y.o. year old male who is a primary patient of Modena Slater, DO.  The [facility] Managed Care Coordination team was consulted for assistance with:    Chronic healthcare management needs, HTN, DM, tobacco use, HLD, CAD, h/o CVA, seizures  Arthur [name] was given information about Medicaid Managed Care Coordination team services today. [name] Patient agreed to services and verbal consent obtained.  Engaged with patient by telephone for follow up visit in response to provider referral for case management and/or care coordination services.   Assessments/Interventions:  Review of past medical history, allergies, medications, health status, including review of consultants reports, laboratory and other test data, was performed as part of comprehensive evaluation and provision of chronic care management services.  SDOH (Social Determinants of Health) assessments and interventions performed: SDOH Interventions    Flowsheet Row Patient Outreach Telephone from 06/25/2022 in [place] POPULATION HEALTH DEPARTMENT Patient Outreach Telephone from 05/29/2022 in [place] POPULATION HEALTH DEPARTMENT Patient Outreach Telephone from 04/25/2022 in [place] POPULATION HEALTH DEPARTMENT Patient Outreach Telephone from 03/23/2022 in [facility] Care Coordination Patient Outreach Telephone from 02/16/2022 in [facility] Care Coordination Patient Outreach Telephone from 01/08/2022 in [facility] Care Coordination  SDOH Interventions        Food Insecurity Interventions -- -- -- Other (Comment)  [food stamps received] -- --  Housing Interventions -- -- Inpatient TOC  [no housing concerns at this time] -- -- --  Transportation Interventions -- Intervention Not Indicated -- -- -- --  Utilities Interventions -- -- --  -- Intervention Not Indicated --  Alcohol Usage Interventions Intervention Not Indicated (Score <7) -- -- -- -- --  Financial Strain Interventions -- -- -- -- -- Other (Comment)  [referrals placed]  Physical Activity Interventions -- -- -- Intervention Not Indicated -- --  Stress Interventions Intervention Not Indicated -- -- -- -- Intervention Not Indicated     Care Plan  Allergies  Allergen Reactions   Penicillins Hives    Did it involve swelling of the face/tongue/throat, SOB, or low BP? Y Did it involve sudden or severe rash/hives, skin peeling, or any reaction on the inside of your mouth or nose? N Did you need to seek medical attention at a hospital or doctor's office? Y When did it last happen?  Over 5 Years Ago     If all above answers are "NO", may proceed with cephalosporin use.    Medications Reviewed Today     Reviewed by Danie Chandler, RN (Registered Nurse) on 06/25/22 at 0930  Med List Status: <None>   Medication Order Taking? Sig Documenting Provider Last Dose Status Informant  amLODipine (NORVASC) 10 MG tablet [id number] Yes Take 1 tablet (10 mg total) by mouth daily. Elder [name], MD Taking Active   aspirin EC 81 MG tablet [id number] No Take 1 tablet (81 mg total) by mouth daily. Swallow whole.  Patient not taking: Reported on 04/04/2022   [name], [place] Not Taking Active   aspirin EC 81 MG tablet [id number] No Take 1 tablet (81 mg total) by mouth daily. Swallow whole.  Patient not taking: Reported on 06/25/2022   Elder [name], MD Not Taking Active   atorvastatin (LIPITOR) 40 MG tablet [id number] Yes Take 1 tablet (40 mg total) by mouth daily. Patwardhan,  Reynold Bowen, MD Taking Active   Blood Pressure Monitor KIT [id number]  Use to check blood pressure dailly. [name], MD  Active   cholecalciferol (VITAMIN D) 25 MCG (1000 UNIT) tablet [id number] No Take by mouth.  Patient not taking: Reported on 04/04/2022    Not Taking Active   hydrALAZINE  (APRESOLINE) 50 MG tablet [id number] Yes Take 1 tablet (50 mg total) by mouth every 8 (eight) hours. Patwardhan, Reynold Bowen, MD Taking Active   isosorbide dinitrate (ISORDIL) 30 MG tablet [id number] Yes Take 1 tablet (30 mg total) by mouth 3 (three) times daily. Patwardhan, Reynold Bowen, MD Taking Active   lacosamide (VIMPAT) 200 MG TABS tablet [id number] Yes Take 1 tablet (200 mg total) by mouth 2 (two) times daily. Penumalli, Earlean Polka, MD Taking Active   levETIRAcetam (KEPPRA) 750 MG tablet [id number] Yes Take 2 tablets (1,500 mg total) by mouth 2 (two) times daily. Penumalli, Earlean Polka, MD Taking Active   metFORMIN (GLUCOPHAGE) 500 MG tablet [id number] Yes TAKE 2 TABLETS (1000 MG) BY MOUTH TWICE A DAY WITH A MEAL (2AM+2PM) [name], DO Taking Active   metoprolol tartrate (LOPRESSOR) 50 MG tablet [id number] Yes Take 1 tablet (50 mg total) by mouth 2 (two) times daily. Patwardhan, Reynold Bowen, MD Taking Active   sacubitril-valsartan (ENTRESTO) 49-51 MG [id number] Yes Take 1 tablet by mouth 2 (two) times daily. Start on 06/29/2021 Nigel Mormon, MD Taking Active   Med List Note [name], CPhT 02/24/21 1756): Vimpat: "12/08/20 receiving PAP through [employer], approved until 12/08/2022"           Patient Active Problem List   Diagnosis Date Noted   Screening for colon cancer 12/21/2021   Vitamin D deficiency 09/07/2021   Cognitive impairment 09/06/2021   Coronary artery disease    HFrEF (heart failure with reduced ejection fraction) ([place]) 05/10/2021   Encephalopathy acute 03/11/2021   Seizures ([place]) 02/24/2021   Aortic atherosclerosis ([place]) 11/22/2020   History of seizures 11/22/2020   Alcohol use disorder, severe, dependence ([place]) 11/22/2020   Type 2 diabetes mellitus ([place]) 11/22/2020   Anemia 11/22/2020   Hyperlipidemia 11/22/2020   Acute encephalopathy 10/16/2020   Essential hypertension 10/08/2019   History of CVA (cerebrovascular accident) 10/02/2019   Conditions to be  addressed/monitored per PCP order:  Chronic healthcare management needs, HTN, DM, tobacco use, HLD, CAD, h/o CVA, seizures  Care Plan : RN Care Manager Plan of Care  Updates made by Gayla Medicus, RN since 06/25/2022 12:00 AM     Problem: Chronic Disease Management and Care Coordination Needs for DMII, HTN, CAD, Seizures   Priority: High     Long-Range Goal: Development of Plan of Care for Chronic Disease Management and Care Coordination Needs (CAD, HTN, DMII, Seizures)   Start Date: 08/08/2021  Expected End Date: 07/26/2022  Priority: High  Note:   Current Barriers:  Knowledge Deficits related to plan of care for management of CAD, HTN, DMII, and Seizures, tobacco use, HLD, h/o CVA Care Coordination needs related to Financial constraints related to affordable safe housing and utility payment, Housing barriers, Medication procurement, ADL IADL limitations, Literacy concerns, Cognitive Deficits, Memory Deficits, Inability to perform IADL's independently, and Lacks knowledge of community resource: safe & affordable Section 8 Housing, utility payment assistance and dental care providers covered by [name].  Chronic Disease Management support and education needs related to CAD, HTN, DMII, and Seizures, tobacco use, HLD, h/o CVA Film/video editor.  Non-adherence to prescribed medication regimen  Difficulty obtaining medications Cognitive Deficits No Advanced Directives in place Falls - Increased Potential for Falls 06/25/22:  No issues today per patient's wife-DPR.  Does not check BP or BG.  Continues to smoke 3 cigars a day.  Is appealing disability denial.  Has all meds and taking all meds per patient's wife, DPR.    RNCM Clinical Goal(s):  Patient will verbalize understanding of plan for management of CAD, HTN, DMII, and Seizures as evidenced by improved management of these chronic diseases. verbalize basic understanding of CAD, HTN, DMII, and Seizures disease process  and self health management plan as evidenced by noted improvement of management of these chronic diseases. take all medications exactly as prescribed and will call provider for medication related questions as evidenced by being compliant with all medications    attend all scheduled medical appointments     demonstrate improved adherence to prescribed treatment plan for CAD, HTN, DMII, and Seizures as evidenced by overall improved management of these chronic diseases continue to work with Medical illustrator and/or Social Worker to address care management and care coordination needs related to CAD, HTN, DMII, and Seizures as evidenced by adherence to CM Team Scheduled appointments     work with pharmacist to address Financial constraints related to obtaining medications and Medication procurement related to CAD, HTN, DMII, and Seizures as evidenced by review of EMR and patient or pharmacist report    work with Child psychotherapist to address Financial constraints related to safe affordable Housing and utility payment , Housing barriers, ADL IADL limitations, Cognitive Deficits, Memory Deficits, Inability to perform IADL's independently, and Lacks knowledge of community resource: safe and affordable Section 8 Housing, utility payment assistance and dental care providers covered by [employer] insurance related to the management of CAD, HTN, DMII, and Seizures as evidenced by review of EMR and patient or Child psychotherapist report through collaboration with Medical illustrator, provider, and care team.   Interventions: Inter-disciplinary care team collaboration (see longitudinal plan of care) Evaluation of current treatment plan related to  self management and patient's adherence to plan as established by provider BSW referral for Podiatry resources-completed Collaborated with BSW. Pharmacy referral for medication assistance-completed Collaborated with Pharmacy  CAD  (Status: Goal on Track (progressing): YES.) Long  Term Goal  Assessed understanding of CAD diagnosis Medications reviewed including medications utilized in CAD treatment plan Provided education on importance of blood pressure control in management of CAD; Counseled on the importance of exercise goals with target of 150 minutes per week Reviewed Importance of taking all medications as prescribed Reviewed Importance of attending all scheduled provider appointments Assessed social determinant of health barriers;  Diabetes:  (Status: Goal on Track (progressing): YES.) Long Term Goal  Lab Results  Component Value Date   HGBA1C 7.1 (H) 09/05/2021   HGBA1C=5.9 on 12/21/21 Assessed patient's understanding of A1c goal: <7% Provided education to patient about basic DM disease process; Reviewed medications with patient and discussed importance of medication adherence;        Discussed plans with patient for ongoing care management follow up and provided patient with direct contact information for care management team;      Reviewed scheduled/upcoming provider appointments        Referral made to pharmacy team for assistance with complex medication regimen; [facility] Pharmacist involved in care;       Referral made to social work team for assistance with housing and utility payment assistance, dental care providers covered by Managed Medicaid; [facility] BSW involved  in care;      Assessed social determinant of health barriers;        [name] denies any episodes or any signs/symptoms of Hypoglycemia or Hyperglycemia.   Seizures  (Status: Goal on Track (progressing): YES.) Long Term Goal  Evaluation of current treatment plan related to  Seizures ,  and any seizure activity,  self-management and patient's adherence to plan as established by provider. Discussed plans with patient for ongoing care management follow up and provided patient with direct contact information for care management team Advised patient to report any seizure activity to PCP; Reviewed  medications with patient and discussed importance of medication compliance; Reviewed scheduled/upcoming provider appointments  Social Work referral for Housing, utility payment and dental care providers-completed Pharmacy referral for complex medication regimen; Assessed social determinant of health barriers;  [name] reports no recent seizures.  Patient currently has all medications per patient's wife.  Hypertension: (Status: Goal on Track (progressing): YES.) Long Term Goal  Last practice recorded BP readings:  BP Readings from Last 3 Encounters:  09/05/21 128/78  08/28/21 132/78  07/11/21 128/74     02/15/22-102/73 Most recent eGFR/CrCl:  Lab Results  Component Value Date   EGFR 92 06/06/2021    No components found for: CRCL  Evaluation of current treatment plan related to hypertension self management and patient's adherence to plan as established by provider;   Reviewed prescribed diet Low Salt, Heart Healthy Reviewed medications with patient and discussed importance of compliance  Provided assistance with obtaining home blood pressure monitor via contacting  [place].  [facility] staff confirmed that blood pressure monitor is ready.  I requested they deliver the monitor to patient's new home.  [facility] will deliver the monitor.;  Counseled on the importance of exercise goals with target of 150 minutes per week Discussed plans with patient for ongoing care management follow up and provided patient with direct contact information for care management team; Reviewed scheduled/upcoming provider appointments Assessed social determinant of health barriers;   Patient Goals/Self-Care Activities: Take medications as prescribed   Attend all scheduled provider appointments Call pharmacy for medication refills 3-7 days in advance of running out of medications Call provider office for new concerns or questions  Work with the social worker to address care coordination  needs and will continue to work with the clinical team to address health care and disease management related needs   Long-Range Goal: Establish Plan of Care for Chronic Disease Management Needs   Priority: High  Note:   Timeframe:  Long-Range Goal Priority:  High Start Date:    12/05/21                         Expected End Date:  ongoing                     Follow Up Date 07/25/22   - schedule appointment for vaccines needed due to my age or health - schedule recommended health tests - schedule and keep appointment for annual check-up    Why is this important?   Screening tests can find diseases early when they are easier to treat.  Your doctor or nurse will talk with you about which tests are important for you.  Getting shots for common diseases like the flu and shingles will help prevent them.   06/25/22:  Patient has an appt 07/05/22 with Podiatry and 07/10/22 with PCP   Follow Up:  Patient  agrees to Care Plan and Follow-up.  Plan: The Managed Medicaid care management team will reach out to the patient again over the next 30 business  days. and The  Patient has been provided with contact information for the Managed Medicaid care management team and has been advised to call with any health related questions or concerns.  Date/time of next scheduled RN care management/care coordination outreach: 07/23/22 at 230

## 2022-06-26 ENCOUNTER — Other Ambulatory Visit: Payer: Medicaid Other

## 2022-06-26 NOTE — Patient Outreach (Signed)
Medicaid Managed Care Social Work Note  06/26/2022 Name:  Arthur [name] MRN:  [id number] DOB:  1961-01-02  Arthur [name] is an 62 y.o. year old male who is a primary patient of Arthur Aurora, DO.  The [facility] Managed Care Coordination team was consulted for assistance with:  Community Resources   Arthur [name] was given information about Medicaid Managed Care Coordination team services today. Arthur [name] Primary Caregiver agreed to services and verbal consent obtained.  Engaged with patient  for by telephone forfollow up visit in response to referral for case management and/or care coordination services.   Assessments/Interventions:  Review of past medical history, allergies, medications, health status, including review of consultants reports, laboratory and other test data, was performed as part of comprehensive evaluation and provision of chronic care management services.  SDOH: (Social Determinant of Health) assessments and interventions performed: SDOH Interventions    Flowsheet Row Patient Outreach Telephone from 06/25/2022 in [place] Patient Outreach Telephone from 05/29/2022 in [place] Patient Outreach Telephone from 04/25/2022 in [place] Patient Outreach Telephone from 03/23/2022 in [place] Patient Outreach Telephone from 02/16/2022 in [place] Patient Outreach Telephone from 01/08/2022 in [place] Coordination  SDOH Interventions        Food Insecurity Interventions -- -- -- Other (Comment)  [food stamps received] -- --  Housing Interventions -- -- Inpatient TOC  [no housing concerns at this time] -- -- --  Transportation Interventions -- Intervention Not Indicated -- -- -- --  Utilities Interventions -- -- -- -- Intervention Not Indicated --  Alcohol Usage Interventions  Intervention Not Indicated (Score <7) -- -- -- -- --  Financial Strain Interventions -- -- -- -- -- Other (Comment)  [referrals placed]  Physical Activity Interventions -- -- -- Intervention Not Indicated -- --  Stress Interventions Intervention Not Indicated -- -- -- -- Intervention Not Indicated     BSW completed a telephone outreach with patients significant other. She stated patient was not at home right now, but he is doing well. No resources are needed at this time.   Advanced Directives Status:  Not addressed in this encounter.  Care Plan                 Allergies  Allergen Reactions   Penicillins Hives    Did it involve swelling of the face/tongue/throat, SOB, or low BP? Y Did it involve sudden or severe rash/hives, skin peeling, or any reaction on the inside of your mouth or nose? N Did you need to seek medical attention at a hospital or doctor's office? Y When did it last happen?  Over 5 Years Ago     If all above answers are "NO", may proceed with cephalosporin use.     Medications Reviewed Today     Reviewed by Arthur Medicus, RN (Registered Nurse) on 06/25/22 at 0930  Med List Status: <None>   Medication Order Taking? Sig Documenting Provider Last Dose Status Informant  amLODipine (NORVASC) 10 MG tablet [id number] Yes Take 1 tablet (10 mg total) by mouth daily. Arthur Mormon, MD Taking Active   aspirin EC 81 MG tablet [id number] No Take 1 tablet (81 mg total) by mouth daily. Swallow whole.  Patient not taking: Reported on 04/04/2022   Arthur [name] Not Taking Active   aspirin EC 81 MG tablet [id number] No Take 1  tablet (81 mg total) by mouth daily. Swallow whole.  Patient not taking: Reported on 06/25/2022   Arthur Mormon, MD Not Taking Active   atorvastatin (LIPITOR) 40 MG tablet [id number] Yes Take 1 tablet (40 mg total) by mouth daily. Arthur Mormon, MD Taking Active   Blood Pressure Monitor KIT [id number]  Use to check blood pressure  dailly. Arthur Manes, MD  Active   cholecalciferol (VITAMIN D) 25 MCG (1000 UNIT) tablet [id number] No Take by mouth.  Patient not taking: Reported on 04/04/2022    Not Taking Active   hydrALAZINE (APRESOLINE) 50 MG tablet [id number] Yes Take 1 tablet (50 mg total) by mouth every 8 (eight) hours. Patwardhan, Reynold Bowen, MD Taking Active   isosorbide dinitrate (ISORDIL) 30 MG tablet [id number] Yes Take 1 tablet (30 mg total) by mouth 3 (three) times daily. Patwardhan, Reynold Bowen, MD Taking Active   lacosamide (VIMPAT) 200 MG TABS tablet [id number] Yes Take 1 tablet (200 mg total) by mouth 2 (two) times daily. Penumalli, Earlean Polka, MD Taking Active   levETIRAcetam (KEPPRA) 750 MG tablet [id number] Yes Take 2 tablets (1,500 mg total) by mouth 2 (two) times daily. Penumalli, Earlean Polka, MD Taking Active   metFORMIN (GLUCOPHAGE) 500 MG tablet [id number] Yes TAKE 2 TABLETS (1000 MG) BY MOUTH TWICE A DAY WITH A MEAL (2AM+2PM) Arthur Aurora, DO Taking Active   metoprolol tartrate (LOPRESSOR) 50 MG tablet [id number] Yes Take 1 tablet (50 mg total) by mouth 2 (two) times daily. Patwardhan, Reynold Bowen, MD Taking Active   sacubitril-valsartan (ENTRESTO) 49-51 MG [id number] Yes Take 1 tablet by mouth 2 (two) times daily. Start on 06/29/2021 Arthur Mormon, MD Taking Active   Med List Note Arthur [name], CPhT 02/24/21 1756): Vimpat: "12/08/20 receiving PAP through [employer], approved until 12/08/2022"            Patient Active Problem List   Diagnosis Date Noted   Screening for colon cancer 12/21/2021   Vitamin D deficiency 09/07/2021   Cognitive impairment 09/06/2021   Coronary artery disease    HFrEF (heart failure with reduced ejection fraction) ([place]) 05/10/2021   Encephalopathy acute 03/11/2021   Seizures ([place]) 02/24/2021   Aortic atherosclerosis ([place]) 11/22/2020   History of seizures 11/22/2020   Alcohol use disorder, severe, dependence ([place]) 11/22/2020   Type 2 diabetes mellitus ([place]) 11/22/2020   Anemia  11/22/2020   Hyperlipidemia 11/22/2020   Acute encephalopathy 10/16/2020   Essential hypertension 10/08/2019   History of CVA (cerebrovascular accident) 10/02/2019    Conditions to be addressed/monitored per PCP order:   community resources  There are no care plans that you recently modified to display for this patient.   Follow up:  Patient agrees to Care Plan and Follow-up.  Plan: The  Primary Caregiver has been provided with contact information for the Managed Medicaid care management team and has been advised to call with any health related questions or concerns.    [name], BSW, [place] Managed Medicaid Team  [phone]

## 2022-06-26 NOTE — Patient Instructions (Signed)
Visit Information  Arthur [name] was given information about Medicaid Managed Care team care coordination services as a part of their [place] Medicaid benefit. Arthur [name] verbally consented to engagement with the [facility] Managed Care team.   If you are experiencing a medical emergency, please call 911 or report to your local emergency department or urgent care.   If you have a non-emergency medical problem during routine business hours, please contact your provider's office and ask to speak with a nurse.   For questions related to your [facility], please call: [phone] or visit the homepage here: [email or web address]  If you would like to schedule transportation through your [facility], please call the following number at least 2 days in advance of your appointment: [phone]   Rides for urgent appointments can also be made after hours by calling Member Services.  Call the [place] at [phone], at any time, 24 hours a day, 7 days a week. If you are in danger or need immediate medical attention call 911.  If you would like help to quit smoking, call 1-800-QUIT-NOW 5142184709) OR Espaol: 1-855-Djelo-Ya (1-103-159-4585) o para ms informacin haga clic aqu or Text READY to 200-400 to register via text  [name] - following are the goals we discussed in your visit today:   Goals Addressed   None      The  Primary Caregiver has been provided with contact information for the Managed Medicaid care management team and has been advised to call with any health related questions or concerns.  Arthur [name], Arthur [name], [place] Managed Medicaid Team  [phone]   Following is a copy of your plan of care:  There are no care plans that you recently modified to display for this patient.

## 2022-07-05 ENCOUNTER — Encounter: Payer: Self-pay | Admitting: Podiatry

## 2022-07-05 ENCOUNTER — Ambulatory Visit (INDEPENDENT_AMBULATORY_CARE_PROVIDER_SITE_OTHER): Payer: Medicaid Other | Admitting: Podiatry

## 2022-07-05 VITALS — BP 108/69 | HR 57

## 2022-07-05 DIAGNOSIS — B351 Tinea unguium: Secondary | ICD-10-CM | POA: Diagnosis not present

## 2022-07-05 DIAGNOSIS — M79674 Pain in right toe(s): Secondary | ICD-10-CM | POA: Diagnosis not present

## 2022-07-05 DIAGNOSIS — M79675 Pain in left toe(s): Secondary | ICD-10-CM

## 2022-07-05 DIAGNOSIS — Q828 Other specified congenital malformations of skin: Secondary | ICD-10-CM

## 2022-07-05 NOTE — Addendum Note (Signed)
Addended by: Gardiner Barefoot on: 07/05/2022 03:26 PM   Modules accepted: Level of Service

## 2022-07-05 NOTE — Progress Notes (Signed)
This patient presents  to my office for at risk foot care.  This patient requires this care by a professional since this patient will be at risk due to having diabetes.  This patient is unable to cut nails himself since the patient cannot reach his nails.These nails are painful walking and wearing shoes.    He has developed painful callus on both feet which makes it difficult to walk.  He presents to the office with his mother.  This patient presents for at risk foot care today.  General Appearance  Alert, conversant and in no acute stress.  Vascular  Dorsalis pedis and posterior tibial  pulses are palpable  bilaterally.  Capillary return is within normal limits  bilaterally. Temperature is within normal limits  bilaterally.  Neurologic  Senn-Weinstein monofilament wire test within normal limits  bilaterally. Muscle power within normal limits bilaterally.  Nails Thick disfigured discolored nails with subungual debris  from hallux to fifth toes bilaterally. No evidence of bacterial infection or drainage bilaterally.  Orthopedic  No limitations of motion  feet .  No crepitus or effusions noted.  No bony pathology or digital deformities noted.  Skin  normotropic skin with no porokeratosis noted bilaterally.  No signs of infections or ulcers noted.   Callus heels  B/L and sub 5th met right foot.  Onychomycosis  Pain in right toes  Pain in left toes  Consent was obtained for treatment procedures.   Mechanical debridement of nails 1-5  bilaterally performed with a nail nipper.  Filed with dremel without incident. Callus were done as a courtesy.   Return office visit     3 months                 Told patient to return for periodic foot care and evaluation due to potential at risk complications.   Gardiner Barefoot DPM

## 2022-07-10 ENCOUNTER — Ambulatory Visit (INDEPENDENT_AMBULATORY_CARE_PROVIDER_SITE_OTHER): Payer: Medicaid Other | Admitting: Student

## 2022-07-10 ENCOUNTER — Other Ambulatory Visit: Payer: Self-pay

## 2022-07-10 ENCOUNTER — Encounter: Payer: Self-pay | Admitting: Student

## 2022-07-10 VITALS — BP 177/101 | HR 58 | Temp 97.6°F | Ht 62.0 in | Wt 133.0 lb

## 2022-07-10 DIAGNOSIS — E119 Type 2 diabetes mellitus without complications: Secondary | ICD-10-CM

## 2022-07-10 DIAGNOSIS — F17210 Nicotine dependence, cigarettes, uncomplicated: Secondary | ICD-10-CM | POA: Diagnosis not present

## 2022-07-10 DIAGNOSIS — I110 Hypertensive heart disease with heart failure: Secondary | ICD-10-CM

## 2022-07-10 DIAGNOSIS — R809 Proteinuria, unspecified: Secondary | ICD-10-CM

## 2022-07-10 DIAGNOSIS — Z23 Encounter for immunization: Secondary | ICD-10-CM | POA: Diagnosis not present

## 2022-07-10 DIAGNOSIS — Z0000 Encounter for general adult medical examination without abnormal findings: Secondary | ICD-10-CM

## 2022-07-10 DIAGNOSIS — I5020 Unspecified systolic (congestive) heart failure: Secondary | ICD-10-CM

## 2022-07-10 DIAGNOSIS — Z7984 Long term (current) use of oral hypoglycemic drugs: Secondary | ICD-10-CM

## 2022-07-10 DIAGNOSIS — E1129 Type 2 diabetes mellitus with other diabetic kidney complication: Secondary | ICD-10-CM

## 2022-07-10 DIAGNOSIS — F17200 Nicotine dependence, unspecified, uncomplicated: Secondary | ICD-10-CM

## 2022-07-10 DIAGNOSIS — I10 Essential (primary) hypertension: Secondary | ICD-10-CM | POA: Diagnosis not present

## 2022-07-10 LAB — POCT GLYCOSYLATED HEMOGLOBIN (HGB A1C): Hemoglobin A1C: 5.6 % (ref 4.0–5.6)

## 2022-07-10 LAB — GLUCOSE, CAPILLARY: Glucose-Capillary: 72 mg/dL (ref 70–99)

## 2022-07-10 MED ORDER — NICOTINE 14 MG/24HR TD PT24: 14.0000 mg | MEDICATED_PATCH | TRANSDERMAL | 0 refills | Status: AC

## 2022-07-10 NOTE — Progress Notes (Signed)
   CC: follow-up  HPI:  Arthur [name] is a 62 y.o. person with medical history as below presenting to [facility] for follow-up.  Please see problem-based list for further details, assessments, and plans.  Past Medical History:  Diagnosis Date   DM2 (diabetes mellitus, type 2) ([place])    ETOH abuse    Hypertension    Seizures ([place])    Transaminitis    Review of Systems:  As per HPI  Physical Exam:  Vitals:   07/10/22 1436 07/10/22 1529  BP: (!) 171/103 (!) 177/101  Pulse: (!) 55 (!) 58  Temp: 97.6 F (36.4 C)   TempSrc: Oral   SpO2: 100%   Weight: 133 lb (60.3 kg)   Height: 5\' 2"  (1.575 m)    General: Resting comfortably in chair, no acute distress CV: Regular rate, rhythm. No murmurs appreciated. Warm extremities.  Pulm: Normal work of breathing on room air. Clear to auscultation bilaterally.  MSK: Normal bulk, tone. No peripheral edema noted.  Skin: Warm, dry. No rashes or lesions appreciated.  Neuro: Awake, alert, conversing appropriately. Grossly non-focal. Psych: Normal mood, affect, speech.   Assessment & Plan:   Essential hypertension BP Readings from Last 3 Encounters:  07/10/22 (!) 177/101  07/05/22 108/69  02/15/22 102/73   Blood pressure significantly elevated in clinic today. However, patient reports he has not taken his medications this morning. He does state he is compliant with his medications, even those that are 2-3x per day. Recent blood pressure last week normotensive at 108/69. We will continue with current regimen and check BMP today.  - Amlodipine 10mg  daily - Metoprolol tartrate 50mg  twice daily - Isosorbide dinitrate 30mg  three times daily - Hydralazine 50mg  three times daily - BMP today  Type 2 diabetes mellitus (HCC) A1c 5.6% today from 5.9% at last visit. He has been tolerating his medication well without any significant GI side effects. No polyuria, polydipsia, significant vision changes. He is due for ophthalmology exam, has plans for one  next month. He has been well-controlled for awhile, likely can increase increments for A1c check to 49mo.  - Metformin 1000mg  twice daily - UMicroalbumin/creatinine ratio today - Follow-up with ophthalmology - BMP today  Healthcare maintenance - Patient would like to defer colonoscopy at this time.  - Tdap given  Tobacco use disorder Patient reports he smokes a few black and milds daily. He has thought about quitting before and is interested in doing so today. We discussed options, including nicotine replacement therapy vs medication. Of note, he does have a seizure disorder and therefore Wellbutrin would not be a good option for him. At this time he would like to start with the nicotine patches. Discussed we can increase the dose if needed.  - Nicotine patches ordered  Patient discussed with Dr.  Richrd Sox, MD Internal Medicine PGY-3 Pager: [phone]

## 2022-07-10 NOTE — Patient Instructions (Signed)
Mr.Cleotis A Scharpf, it was a pleasure seeing you today!  Today we discussed: - Blood pressure: Please make sure you are taking your medications as prescribed. We will not making any further changes today.  - Diabetes: Your A1c today is 5.6%, congratulations! Continue with your metformin. I am going to check on your kidneys with a urine and blood test today.  - I have given you nicotine patches to help with smoking. If you feel like you need an increase dose, please give [place] a call.  - We have given you the tetanus shot today.  - Please think about getting a colonoscopy. If you have any questions regarding this, we'd be happy to help.   I have ordered the following labs today:   Lab Orders         Glucose, capillary         Microalbumin / Creatinine Urine Ratio         POC Hbg A1C      I have ordered the following medication/changed the following medications:   Start the following medications: Meds ordered this encounter  Medications   nicotine (NICODERM CQ - DOSED IN MG/24 HOURS) 14 mg/24hr patch    Sig: Place 1 patch (14 mg total) onto the skin daily.    Dispense:  30 patch    Refill:  0     Follow-up: 6 months   Please make sure to arrive 15 minutes prior to your next appointment. If you arrive late, you may be asked to reschedule.   We look forward to seeing you next time. Please call our clinic at [phone] if you have any questions or concerns. The best time to call is Monday-Friday from 9am-4pm, but there is someone available 24/7. If after hours or the weekend, call the main hospital number and ask for the Internal Medicine Resident On-Call. If you need medication refills, please notify your pharmacy one week in advance and they will send [place] a request.  Thank you for letting us take part in your care. Wishing you the best!  Thank you, Sanjuan Dame, MD

## 2022-07-11 ENCOUNTER — Encounter: Payer: Self-pay | Admitting: Student

## 2022-07-11 DIAGNOSIS — Z0000 Encounter for general adult medical examination without abnormal findings: Secondary | ICD-10-CM | POA: Insufficient documentation

## 2022-07-11 DIAGNOSIS — F17200 Nicotine dependence, unspecified, uncomplicated: Secondary | ICD-10-CM | POA: Insufficient documentation

## 2022-07-11 LAB — MICROALBUMIN / CREATININE URINE RATIO
Creatinine, Urine: 237.9 mg/dL
Microalb/Creat Ratio: 46 mg/g{creat} — ABNORMAL HIGH (ref 0–29)
Microalbumin, Urine: 109.2 ug/mL

## 2022-07-11 LAB — BMP8+ANION GAP
Anion Gap: 18 mmol/L (ref 10.0–18.0)
BUN/Creatinine Ratio: 11 (ref 9–20)
BUN: 10 mg/dL (ref 6–24)
CO2: 20 mmol/L (ref 20–29)
Calcium: 9.3 mg/dL (ref 8.7–10.2)
Chloride: 100 mmol/L (ref 96–106)
Creatinine, Ser: 0.91 mg/dL (ref 0.76–1.27)
Glucose: 92 mg/dL (ref 70–99)
Potassium: 4.4 mmol/L (ref 3.5–5.2)
Sodium: 138 mmol/L (ref 134–144)
eGFR: 97 mL/min/{1.73_m2} (ref >59)

## 2022-07-11 NOTE — Assessment & Plan Note (Signed)
-   Patient would like to defer colonoscopy at this time.  - Tdap given

## 2022-07-11 NOTE — Assessment & Plan Note (Signed)
BP Readings from Last 3 Encounters:  07/10/22 (!) 177/101  07/05/22 108/69  02/15/22 102/73   Blood pressure significantly elevated in clinic today. However, patient reports he has not taken his medications this morning. He does state he is compliant with his medications, even those that are 2-3x per day. Recent blood pressure last week normotensive at 108/69. We will continue with current regimen and check BMP today.  - Amlodipine 24m daily - Metoprolol tartrate 57mtwice daily - Isosorbide dinitrate 3060mhree times daily - Hydralazine 47m36mree times daily - BMP today

## 2022-07-11 NOTE — Assessment & Plan Note (Signed)
Patient reports he smokes a few black and milds daily. He has thought about quitting before and is interested in doing so today. We discussed options, including nicotine replacement therapy vs medication. Of note, he does have a seizure disorder and therefore Wellbutrin would not be a good option for him. At this time he would like to start with the nicotine patches. Discussed we can increase the dose if needed.  - Nicotine patches ordered

## 2022-07-11 NOTE — Assessment & Plan Note (Signed)
A1c 5.6% today from 5.9% at last visit. He has been tolerating his medication well without any significant GI side effects. No polyuria, polydipsia, significant vision changes. He is due for ophthalmology exam, has plans for one next month. He has been well-controlled for awhile, likely can increase increments for A1c check to 10mo  - Metformin 10014mtwice daily - UMicroalbumin/creatinine ratio today - Follow-up with ophthalmology - BMP today  ADDENDUM: -Microalbumin/creatinine mildly elevated at 46 today. Patient is not currently on antihypertensive or SGLT2i. It seems as though his blood pressure is on the lower side (108/69 a few days prior) on his current regimen. We will initiate SGLT2i and have him return in one month.  - Start Jardiance 1010m- Return to clinic in one month

## 2022-07-12 MED ORDER — EMPAGLIFLOZIN 10 MG PO TABS: 10.0000 mg | ORAL_TABLET | Freq: Every day | ORAL | 0 refills | Status: DC

## 2022-07-12 NOTE — Addendum Note (Signed)
Addended bySanjuan Dame on: 07/12/2022 03:47 PM   Modules accepted: Orders

## 2022-07-17 DIAGNOSIS — R4189 Other symptoms and signs involving cognitive functions and awareness: Secondary | ICD-10-CM | POA: Diagnosis not present

## 2022-07-18 DIAGNOSIS — R4189 Other symptoms and signs involving cognitive functions and awareness: Secondary | ICD-10-CM | POA: Diagnosis not present

## 2022-07-20 DIAGNOSIS — R4189 Other symptoms and signs involving cognitive functions and awareness: Secondary | ICD-10-CM | POA: Diagnosis not present

## 2022-07-20 NOTE — Progress Notes (Signed)
Internal Medicine Clinic Attending ? ?Case discussed with Dr. Braswell  At the time of the visit.  We reviewed the resident?s history and exam and pertinent patient test results.  I agree with the assessment, diagnosis, and plan of care documented in the resident?s note.  ?

## 2022-07-23 ENCOUNTER — Encounter: Payer: Self-pay | Admitting: Obstetrics and Gynecology

## 2022-07-23 ENCOUNTER — Other Ambulatory Visit: Payer: Medicaid Other | Admitting: Obstetrics and Gynecology

## 2022-07-23 DIAGNOSIS — R4189 Other symptoms and signs involving cognitive functions and awareness: Secondary | ICD-10-CM | POA: Diagnosis not present

## 2022-07-23 NOTE — Patient Instructions (Signed)
Visit Information  Arthur [name] / Arthur [name] was given information about Medicaid Managed Care team care coordination services as a part of their [place] Medicaid benefit. Arthur [name] / Arthur [name] verbally consented to engagement with the [facility] Managed Care team.   If you are experiencing a medical emergency, please call 911 or report to your local emergency department or urgent care.   If you have a non-emergency medical problem during routine business hours, please contact your provider's office and ask to speak with a nurse.   For questions related to your [facility], please call: [phone] or visit the homepage here: [email or web address]  If you would like to schedule transportation through your [facility], please call the following number at least 2 days in advance of your appointment: [phone]   Rides for urgent appointments can also be made after hours by calling Member Services.  Call the [place] at [phone], at any time, 24 hours a day, 7 days a week. If you are in danger or need immediate medical attention call 911.  If you would like help to quit smoking, call 1-800-QUIT-NOW (856)856-3242) OR Espaol: 1-855-Djelo-Ya QO:409462) o para ms informacin haga clic aqu or Text READY to 200-400 to register via text  Arthur [name] / Arthur [name] - following are the goals we discussed in your visit today:   Goals Addressed    Timeframe:  Long-Range Goal Priority:  High Start Date:    12/05/21                         Expected End Date:  ongoing                     Follow Up Date 08/21/22   - schedule appointment for vaccines needed due to my age or health - schedule recommended health tests - schedule and keep appointment for annual check-up    Why is this important?   Screening tests can find diseases early when they are  easier to treat.  Your doctor or nurse will talk with you about which tests are important for you.  Getting shots for common diseases like the flu and shingles will help prevent them.   07/23/22:  Recently seen and evaluated by PCP and Podiatry.  Patient verbalizes understanding of instructions and care plan provided today and agrees to view in [facility]. Active MyChart status and patient understanding of how to access instructions and care plan via MyChart confirmed with patient.     The Managed Medicaid care management team will reach out to the patient again over the next 30 business  days.  The  [employer] (Arthur [name]) has been provided with contact information for the Managed Medicaid care management team and has been advised to call with any health related questions or concerns.   Arthur Raider RN, BSN [place] Management Coordinator - Managed Medicaid High Risk [phone]    Following is a copy of your plan of care:  Care Plan : [place] of Care  Updates made by Gayla Medicus, RN since 07/23/2022 12:00 AM     Problem: Chronic Disease Management and Care Coordination Needs for DMII, HTN, CAD, Seizures   Priority: High     Long-Range Goal: Development of Plan of Care for Chronic Disease Management and Care Coordination Needs (CAD, HTN, DMII, Seizures)   Start  Date: 08/08/2021  Expected End Date: 11/21/2022  Priority: High  Note:   Current Barriers:  Knowledge Deficits related to plan of care for management of CAD, HTN, DMII, and Seizures, tobacco use, HLD, h/o CVA Care Coordination needs related to Financial constraints related to affordable safe housing and utility payment, Housing barriers, Medication procurement, ADL IADL limitations, Literacy concerns, Cognitive Deficits, Memory Deficits, Inability to perform IADL's independently, and Lacks knowledge of community resource: safe & affordable Section 8 Housing, utility payment  assistance and dental care providers covered by [name].  Chronic Disease Management support and education needs related to CAD, HTN, DMII, and Seizures, tobacco use, HLD, h/o CVA Film/video editor.  Non-adherence to prescribed medication regimen Difficulty obtaining medications Cognitive Deficits No Advanced Directives in place Falls - Increased Potential for Falls 07/23/22:  Patient given prescription for Nicotine patches-has not started yet.  No complaints today-BP elevated at PCP office, patient's wife states WNL at home-does not check blood sugars-A1C=5.6% on 07/10/22.  No change in smoking.  RNCM Clinical Goal(s):  Patient/ Arthur [name]  will verbalize understanding of plan for management of CAD, HTN, DMII, and Seizures as evidenced by improved management of these chronic diseases. verbalize basic understanding of CAD, HTN, DMII, and Seizures disease process and self health management plan as evidenced by noted improvement of management of these chronic diseases. take all medications exactly as prescribed and will call provider for medication related questions as evidenced by being compliant with all medications    attend all scheduled medical appointments     demonstrate improved adherence to prescribed treatment plan for CAD, HTN, DMII, and Seizures as evidenced by overall improved management of these chronic diseases continue to work with Consulting civil engineer and/or Social Worker to address care management and care coordination needs related to CAD, HTN, DMII, and Seizures as evidenced by adherence to CM Team Scheduled appointments     work with pharmacist to address Financial constraints related to obtaining medications and Medication procurement related to CAD, HTN, DMII, and Seizures as evidenced by review of EMR and patient or pharmacist report    work with Education officer, museum to address Financial constraints related to safe affordable Housing and utility payment , Housing barriers,  ADL IADL limitations, Cognitive Deficits, Memory Deficits, Inability to perform IADL's independently, and Lacks knowledge of community resource: safe and affordable Section 8 Housing, utility payment assistance and dental care providers covered by [employer] insurance related to the management of CAD, HTN, DMII, and Seizures as evidenced by review of EMR and patient or Education officer, museum report through collaboration with Consulting civil engineer, provider, and care team.   Interventions: Inter-disciplinary care team collaboration (see longitudinal plan of care) Evaluation of current treatment plan related to  self management and patient's adherence to plan as established by provider BSW referral for Podiatry resources-completed Collaborated with BSW. Pharmacy referral for medication assistance-completed Collaborated with Pharmacy  CAD  (Status: Goal on Track (progressing): YES.) Long Term Goal  Assessed understanding of CAD diagnosis Medications reviewed including medications utilized in CAD treatment plan Provided education on importance of blood pressure control in management of CAD; Counseled on the importance of exercise goals with target of 150 minutes per week Reviewed Importance of taking all medications as prescribed Reviewed Importance of attending all scheduled provider appointments Assessed social determinant of health barriers;  Diabetes:  (Status: Goal on Track (progressing): YES.) Long Term Goal  Lab Results  Component Value Date   HGBA1C 7.1 (H) 09/05/2021   HGBA1C=5.9  on 12/21/21  HGBA1C=5.6 on 07/23/22 Assessed patient's / Arthur [name]'s understanding of A1c goal: <7% Provided education to patient / Arthur [name] about basic DM disease process; Reviewed medications with patient and discussed importance of medication adherence;        Discussed plans with patient / Arthur [name] for ongoing care management follow up and provided patient with direct contact information for care management team;      Reviewed  scheduled/upcoming provider appointments        Referral made to pharmacy team for assistance with complex medication regimen; [facility] Pharmacist involved in care;       Referral made to social work team for assistance with housing and utility payment assistance, dental care providers covered by Managed Medicaid; [employer] BSW involved in care;      Assessed social determinant of health barriers;        [name] denies any episodes or any signs/symptoms of Hypoglycemia or Hyperglycemia.   Seizures  (Status: Goal on Track (progressing): YES.) Long Term Goal  Evaluation of current treatment plan related to  Seizures ,  and any seizure activity,  self-management and patient's / Arthur [name]'s adherence to plan as established by provider. Discussed plans with patient / Arthur [name] for ongoing care management follow up and provided patient / Arthur [name] with direct contact information for care management team Advised patient / Arthur [name] to report any seizure activity to PCP; Reviewed medications with patient and discussed importance of medication compliance; Reviewed scheduled/upcoming provider appointments  Social Work referral for Housing, utility payment and dental care providers-completed Pharmacy referral for complex medication regimen; Assessed social determinant of health barriers;  [name] reports no recent seizures.  Patient currently has all medications per patient's wife.  Hypertension: (Status: Goal on Track (progressing): YES.) Long Term Goal  Last practice recorded BP readings:  BP Readings from Last 3 Encounters:  09/05/21 128/78  08/28/21 132/78  07/11/21 128/74     02/15/22-102/73    07/10/22- 177/101 Most recent eGFR/CrCl:  Lab Results  Component Value Date   EGFR 92 06/06/2021    No components found for: CRCL  Evaluation of current treatment plan related to hypertension self management and patient's / Arthur [name]'s adherence to plan as established by provider;   Reviewed prescribed diet Low Salt, Heart  Healthy Reviewed medications with patient / Arthur [name] and discussed importance of compliance  Provided assistance with obtaining home blood pressure monitor via contacting  [place].  [facility] staff confirmed that blood pressure monitor is ready.  I requested they deliver the monitor to patient's new home.  [facility] will deliver the monitor.;  Counseled on the importance of exercise goals with target of 150 minutes per week Discussed plans with patient for ongoing care management follow up and provided patient / Arthur [name] with direct contact information for care management team; Reviewed scheduled/upcoming provider appointments Assessed social determinant of health barriers;   Patient Goals/Self-Care Activities: Take medications as prescribed   Attend all scheduled provider appointments Call pharmacy for medication refills 3-7 days in advance of running out of medications Call provider office for new concerns or questions  Work with the social worker to address care coordination needs and will continue to work with the clinical team to address health care and disease management related needs

## 2022-07-23 NOTE — Patient Outreach (Signed)
Medicaid Managed Care   Nurse Care Manager Note  07/23/2022 Name:  Arthur [name] MRN:  [id number] DOB:  11/10/1960  Arthur [name] is an 62 y.o. year old male who is a primary patient of [name], DO.  The [facility] Managed Care Coordination team was consulted for assistance with:    Chronic healthcare management needs, HTN, DM, tobacco use, HLD, DAD, h/o CVA, seizures  Arthur [name] was given information about Medicaid Managed Care Coordination team services today. [name] Patient agreed to services and verbal consent obtained.  Engaged with patient by telephone for follow up visit in response to provider referral for case management and/or care coordination services.   Assessments/Interventions:  Review of past medical history, allergies, medications, health status, including review of consultants reports, laboratory and other test data, was performed as part of comprehensive evaluation and provision of chronic care management services.  SDOH (Social Determinants of Health) assessments and interventions performed: SDOH Interventions    Flowsheet Row Patient Outreach Telephone from 07/23/2022 in [place] Patient Outreach Telephone from 06/25/2022 in [place] Patient Outreach Telephone from 05/29/2022 in [place] Patient Outreach Telephone from 04/25/2022 in [place] Patient Outreach Telephone from 03/23/2022 in [place] Coordination Patient Outreach Telephone from 02/16/2022 in [place] Coordination  SDOH Interventions        Food Insecurity Interventions -- -- -- -- Other (Comment)  [food stamps received] --  Housing Interventions -- -- -- Inpatient TOC  [no housing concerns at this time] -- --  Transportation Interventions -- -- Intervention Not Indicated -- -- --  Utilities Interventions Intervention Not  Indicated -- -- -- -- Intervention Not Indicated  Alcohol Usage Interventions -- Intervention Not Indicated (Score <7) -- -- -- --  Financial Strain Interventions Other (Comment)  [patient has been given resources, DPR] -- -- -- -- --  Physical Activity Interventions -- -- -- -- Intervention Not Indicated --  Stress Interventions -- Intervention Not Indicated -- -- -- --     Care Plan  Allergies  Allergen Reactions   Penicillins Hives    Did it involve swelling of the face/tongue/throat, SOB, or low BP? Y Did it involve sudden or severe rash/hives, skin peeling, or any reaction on the inside of your mouth or nose? N Did you need to seek medical attention at a hospital or doctor's office? Y When did it last happen?  Over 5 Years Ago     If all above answers are "NO", may proceed with cephalosporin use.    Medications Reviewed Today     Reviewed by Gayla Medicus, RN (Registered Nurse) on 07/23/22 at 1454  Med List Status: <None>   Medication Order Taking? Sig Documenting Provider Last Dose Status Informant  amLODipine (NORVASC) 10 MG tablet [id number] No Take 1 tablet (10 mg total) by mouth daily. Nigel Mormon, MD Taking Active   aspirin EC 81 MG tablet [id number] No Take 1 tablet (81 mg total) by mouth daily. Swallow whole. [name], [name], PA-C Taking Active   aspirin EC 81 MG tablet [id number] No Take 1 tablet (81 mg total) by mouth daily. Swallow whole. Patwardhan, Reynold Bowen, MD Taking Active   atorvastatin (LIPITOR) 40 MG tablet [id number] No Take 1 tablet (40 mg total) by mouth daily. Nigel Mormon, MD Taking Active   Blood Pressure Monitor KIT [id number] No Use to check blood  pressure dailly. [name], MD Taking Active   cholecalciferol (VITAMIN D) 25 MCG (1000 UNIT) tablet [id number] No Take by mouth.  Taking Active   empagliflozin (JARDIANCE) 10 MG TABS tablet [id number]  Take 1 tablet (10 mg total) by mouth daily before breakfast. Sanjuan Dame, MD  Active    hydrALAZINE (APRESOLINE) 50 MG tablet [id number] No Take 1 tablet (50 mg total) by mouth every 8 (eight) hours. Patwardhan, Reynold Bowen, MD Taking Active   isosorbide dinitrate (ISORDIL) 30 MG tablet [id number] No Take 1 tablet (30 mg total) by mouth 3 (three) times daily. Patwardhan, Reynold Bowen, MD Taking Active   lacosamide (VIMPAT) 200 MG TABS tablet [id number] No Take 1 tablet (200 mg total) by mouth 2 (two) times daily. Penumalli, Earlean Polka, MD Taking Active   levETIRAcetam (KEPPRA) 750 MG tablet [id number] No Take 2 tablets (1,500 mg total) by mouth 2 (two) times daily. Penni Bombard, MD Taking Active   metFORMIN (GLUCOPHAGE) 500 MG tablet [id number] No TAKE 2 TABLETS (1000 MG) BY MOUTH TWICE A DAY WITH A MEAL (2AM+2PM) [name], DO Taking Active   metoprolol tartrate (LOPRESSOR) 50 MG tablet [id number] No Take 1 tablet (50 mg total) by mouth 2 (two) times daily. Patwardhan, Reynold Bowen, MD Taking Active   nicotine (NICODERM CQ - DOSED IN MG/24 HOURS) 14 mg/24hr patch [id number]  Place 1 patch (14 mg total) onto the skin daily. Sanjuan Dame, MD  Active   sacubitril-valsartan [facility]) 49-51 MG [id number] No Take 1 tablet by mouth 2 (two) times daily. Start on 06/29/2021 Nigel Mormon, MD Taking Active   Med List Note [name], CPhT 02/24/21 1756): [place]: "12/08/20 receiving PAP through [employer], approved until 12/08/2022"           Patient Active Problem List   Diagnosis Date Noted   Healthcare maintenance 07/11/2022   Tobacco use disorder 07/11/2022   Pain due to onychomycosis of toenails of both feet 07/05/2022   Porokeratosis 07/05/2022   Vitamin D deficiency 09/07/2021   Cognitive impairment 09/06/2021   Coronary artery disease    HFrEF (heart failure with reduced ejection fraction) ([place]) 05/10/2021   Seizures ([place]) 02/24/2021   Aortic atherosclerosis ([place]) 11/22/2020   History of seizures 11/22/2020   Alcohol use disorder, severe, dependence ([place]) 11/22/2020    Controlled type 2 diabetes mellitus with microalbuminuria, without long-term current use of insulin ([place]) 11/22/2020   Anemia 11/22/2020   Hyperlipidemia 11/22/2020   Essential hypertension 10/08/2019   History of CVA (cerebrovascular accident) 10/02/2019   Conditions to be addressed/monitored per PCP order:  Chronic healthcare management needs, HTN, DM, tobacco use, HLD, DAD, h/o CVA, seizures  Care Plan : RN Care Manager Plan of Care  Updates made by Gayla Medicus, RN since 07/23/2022 12:00 AM     Problem: Chronic Disease Management and Care Coordination Needs for DMII, HTN, CAD, Seizures   Priority: High     Long-Range Goal: Development of Plan of Care for Chronic Disease Management and Care Coordination Needs (CAD, HTN, DMII, Seizures)   Start Date: 08/08/2021  Expected End Date: 11/21/2022  Priority: High  Note:   Current Barriers:  Knowledge Deficits related to plan of care for management of CAD, HTN, DMII, and Seizures, tobacco use, HLD, h/o CVA Care Coordination needs related to Financial constraints related to affordable safe housing and utility payment, Housing barriers, Medication procurement, ADL IADL limitations, Literacy concerns, Cognitive Deficits, Memory Deficits, Inability to perform IADL's independently, and Lacks knowledge of  community resource: safe & affordable Section 8 Housing, utility payment assistance and dental care providers covered by [name].  Chronic Disease Management support and education needs related to CAD, HTN, DMII, and Seizures, tobacco use, HLD, h/o CVA Film/video editor.  Non-adherence to prescribed medication regimen Difficulty obtaining medications Cognitive Deficits No Advanced Directives in place Falls - Increased Potential for Falls 07/23/22:  Patient given prescription for Nicotine patches-has not started yet.  No complaints today-BP elevated at PCP office, patient's wife states WNL at home-does not check blood  sugars-A1C=5.6% on 07/10/22.  No change in smoking.  RNCM Clinical Goal(s):  Patient/ DPR  will verbalize understanding of plan for management of CAD, HTN, DMII, and Seizures as evidenced by improved management of these chronic diseases. verbalize basic understanding of CAD, HTN, DMII, and Seizures disease process and self health management plan as evidenced by noted improvement of management of these chronic diseases. take all medications exactly as prescribed and will call provider for medication related questions as evidenced by being compliant with all medications    attend all scheduled medical appointments     demonstrate improved adherence to prescribed treatment plan for CAD, HTN, DMII, and Seizures as evidenced by overall improved management of these chronic diseases continue to work with Consulting civil engineer and/or Social Worker to address care management and care coordination needs related to CAD, HTN, DMII, and Seizures as evidenced by adherence to CM Team Scheduled appointments     work with pharmacist to address Financial constraints related to obtaining medications and Medication procurement related to CAD, HTN, DMII, and Seizures as evidenced by review of EMR and patient or pharmacist report    work with Education officer, museum to address Financial constraints related to safe affordable Housing and utility payment , Housing barriers, ADL IADL limitations, Cognitive Deficits, Memory Deficits, Inability to perform IADL's independently, and Lacks knowledge of community resource: safe and affordable Section 8 Housing, utility payment assistance and dental care providers covered by [employer] insurance related to the management of CAD, HTN, DMII, and Seizures as evidenced by review of EMR and patient or Education officer, museum report through collaboration with Consulting civil engineer, provider, and care team.   Interventions: Inter-disciplinary care team collaboration (see longitudinal plan of care) Evaluation of  current treatment plan related to  self management and patient's adherence to plan as established by provider BSW referral for Podiatry resources-completed Collaborated with BSW. Pharmacy referral for medication assistance-completed Collaborated with Pharmacy  CAD  (Status: Goal on Track (progressing): YES.) Long Term Goal  Assessed understanding of CAD diagnosis Medications reviewed including medications utilized in CAD treatment plan Provided education on importance of blood pressure control in management of CAD; Counseled on the importance of exercise goals with target of 150 minutes per week Reviewed Importance of taking all medications as prescribed Reviewed Importance of attending all scheduled provider appointments Assessed social determinant of health barriers;  Diabetes:  (Status: Goal on Track (progressing): YES.) Long Term Goal  Lab Results  Component Value Date   HGBA1C 7.1 (H) 09/05/2021   HGBA1C=5.9 on 12/21/21  HGBA1C=5.6 on 07/23/22 Assessed patient's / DPR's understanding of A1c goal: <7% Provided education to patient / DPR about basic DM disease process; Reviewed medications with patient and discussed importance of medication adherence;        Discussed plans with patient / DPR for ongoing care management follow up and provided patient with direct contact information for care management team;      Reviewed scheduled/upcoming provider  appointments        Referral made to pharmacy team for assistance with complex medication regimen; [facility] Pharmacist involved in care;       Referral made to social work team for assistance with housing and utility payment assistance, dental care providers covered by Managed Medicaid; [facility] BSW involved in care;      Assessed social determinant of health barriers;        [name] denies any episodes or any signs/symptoms of Hypoglycemia or Hyperglycemia.   Seizures  (Status: Goal on Track (progressing): YES.) Long Term Goal  Evaluation of  current treatment plan related to  Seizures ,  and any seizure activity,  self-management and patient's / DPR's adherence to plan as established by provider. Discussed plans with patient / DPR for ongoing care management follow up and provided patient / DPR with direct contact information for care management team Advised patient / DPR to report any seizure activity to PCP; Reviewed medications with patient and discussed importance of medication compliance; Reviewed scheduled/upcoming provider appointments  Social Work referral for Housing, utility payment and dental care providers-completed Pharmacy referral for complex medication regimen; Assessed social determinant of health barriers;  [name] reports no recent seizures.  Patient currently has all medications per patient's wife.  Hypertension: (Status: Goal on Track (progressing): YES.) Long Term Goal  Last practice recorded BP readings:  BP Readings from Last 3 Encounters:  09/05/21 128/78  08/28/21 132/78  07/11/21 128/74     02/15/22-102/73    07/10/22- 177/101 Most recent eGFR/CrCl:  Lab Results  Component Value Date   EGFR 92 06/06/2021    No components found for: CRCL  Evaluation of current treatment plan related to hypertension self management and patient's / DPR's adherence to plan as established by provider;   Reviewed prescribed diet Low Salt, Heart Healthy Reviewed medications with patient / DPR and discussed importance of compliance  Provided assistance with obtaining home blood pressure monitor via contacting  [place].  [facility] staff confirmed that blood pressure monitor is ready.  I requested they deliver the monitor to patient's new home.  [facility] will deliver the monitor.;  Counseled on the importance of exercise goals with target of 150 minutes per week Discussed plans with patient for ongoing care management follow up and provided patient / DPR with direct contact information for care  management team; Reviewed scheduled/upcoming provider appointments Assessed social determinant of health barriers;   Patient Goals/Self-Care Activities: Take medications as prescribed   Attend all scheduled provider appointments Call pharmacy for medication refills 3-7 days in advance of running out of medications Call provider office for new concerns or questions  Work with the social worker to address care coordination needs and will continue to work with the clinical team to address health care and disease management related needs   Long-Range Goal: Establish Plan of Care for Chronic Disease Management Needs   Priority: High  Note:   Timeframe:  Long-Range Goal Priority:  High Start Date:    12/05/21                         Expected End Date:  ongoing                     Follow Up Date 08/21/22   - schedule appointment for vaccines needed due to my age or health - schedule recommended health tests - schedule and keep appointment for annual check-up  Why is this important?   Screening tests can find diseases early when they are easier to treat.  Your doctor or nurse will talk with you about which tests are important for you.  Getting shots for common diseases like the flu and shingles will help prevent them.   07/23/22:  Recently seen and evaluated by PCP and Podiatry.   Follow Up:  Patient agrees to Care Plan and Follow-up.  Plan: The Managed Medicaid care management team will reach out to the patient again over the next 30 business  days. and The  Patient has been provided with contact information for the Managed Medicaid care management team and has been advised to call with any health related questions or concerns.  Date/time of next scheduled RN care management/care coordination outreach:  08/21/22 at 315

## 2022-07-24 DIAGNOSIS — R4189 Other symptoms and signs involving cognitive functions and awareness: Secondary | ICD-10-CM | POA: Diagnosis not present

## 2022-07-25 DIAGNOSIS — R4189 Other symptoms and signs involving cognitive functions and awareness: Secondary | ICD-10-CM | POA: Diagnosis not present

## 2022-07-26 ENCOUNTER — Telehealth: Payer: Self-pay

## 2022-07-26 NOTE — Telephone Encounter (Signed)
No Ms, [name] did not get PCS form for this  patient Arthur [name].

## 2022-07-26 NOTE — Telephone Encounter (Signed)
Seth Bake from [employer] called regarding pcs forms, have you received forms for patient? Please return [facility] call [phone]

## 2022-07-30 ENCOUNTER — Telehealth: Payer: Self-pay

## 2022-07-30 ENCOUNTER — Telehealth: Payer: Self-pay | Admitting: *Deleted

## 2022-07-30 ENCOUNTER — Other Ambulatory Visit: Payer: Self-pay

## 2022-07-30 NOTE — Progress Notes (Signed)
Patient outreached by Wallene Huh, PharmD Candidate on 07/30/2022 to discuss hypertension.  Patient has an automated home blood pressure machine. [name] reports not using the BP monitor much at home. Counseled to take BP measurements at least once daily.    Medication review was performed. They are taking medications as prescribed. [name] reports he has a pill pack that has his medications sorted by day of the week.    The following barriers to adherence were noted:  - They do not have cost concerns.  - They do not have transportation concerns.  - They do not need assistance obtaining refills.  - They do not occasionally forget to take some of their prescribed medications.  - They do not feel like one/some of their medications make them feel poorly.  - They do not have questions or concerns about their medications.  - They do have follow up scheduled with their primary care provider/cardiologist.   The following interventions were completed:  - Medications were reviewed  - Patient was educated on proper technique to check home blood pressure and reminded to bring home machine and readings to next provider appointment  - Patient was educated on use of adherence strategies, like a pill box or alarms  - Patient was counseled on lifestyle modifications to improve blood pressure, including diet and exercise.   The patient has follow up scheduled:  Cardiology: 08/08/22 with Nigel Mormon, MD    Kevan Ny, Pharm.D. PGY-2 Ambulatory Care Pharmacy Resident

## 2022-07-30 NOTE — Telephone Encounter (Signed)
Spoke with patient and wife regarding PCS request/ faxed 07-30-2022 to [employer] for review / scheduling. Patient is to callback with assessment  appointment and the name of the agency when they decide . [employer] ph# [phone] / fax- [phone].  Patient is aware that they will  be contacted to set up appointment for assessment.

## 2022-07-30 NOTE — Telephone Encounter (Signed)
[facility] is calling in regards to paperwork that was fax over on 2/12-- 2/21---/2/29 ----3/4 name of rep andrea ==[phone]

## 2022-07-31 DIAGNOSIS — R4189 Other symptoms and signs involving cognitive functions and awareness: Secondary | ICD-10-CM | POA: Diagnosis not present

## 2022-08-02 DIAGNOSIS — R4189 Other symptoms and signs involving cognitive functions and awareness: Secondary | ICD-10-CM | POA: Diagnosis not present

## 2022-08-02 NOTE — Telephone Encounter (Signed)
A rep from [facility]  called stated that did not receive the forms  back  fax :[phone]

## 2022-08-03 DIAGNOSIS — R4189 Other symptoms and signs involving cognitive functions and awareness: Secondary | ICD-10-CM | POA: Diagnosis not present

## 2022-08-03 NOTE — Telephone Encounter (Signed)
Pt wife is calling due the the agency told her to call let us know the date they will be going out to the home .3/12 @ 1pm

## 2022-08-06 DIAGNOSIS — R4189 Other symptoms and signs involving cognitive functions and awareness: Secondary | ICD-10-CM | POA: Diagnosis not present

## 2022-08-07 DIAGNOSIS — R4189 Other symptoms and signs involving cognitive functions and awareness: Secondary | ICD-10-CM | POA: Diagnosis not present

## 2022-08-08 ENCOUNTER — Ambulatory Visit: Payer: Medicaid Other | Admitting: Cardiology

## 2022-08-08 ENCOUNTER — Encounter: Payer: Self-pay | Admitting: Cardiology

## 2022-08-08 VITALS — BP 132/86 | HR 66 | Resp 16 | Ht 62.0 in | Wt 127.0 lb

## 2022-08-08 DIAGNOSIS — R809 Proteinuria, unspecified: Secondary | ICD-10-CM | POA: Diagnosis not present

## 2022-08-08 DIAGNOSIS — I5020 Unspecified systolic (congestive) heart failure: Secondary | ICD-10-CM | POA: Diagnosis not present

## 2022-08-08 DIAGNOSIS — I10 Essential (primary) hypertension: Secondary | ICD-10-CM

## 2022-08-08 DIAGNOSIS — I2510 Atherosclerotic heart disease of native coronary artery without angina pectoris: Secondary | ICD-10-CM

## 2022-08-08 DIAGNOSIS — R4189 Other symptoms and signs involving cognitive functions and awareness: Secondary | ICD-10-CM | POA: Diagnosis not present

## 2022-08-08 DIAGNOSIS — E1129 Type 2 diabetes mellitus with other diabetic kidney complication: Secondary | ICD-10-CM

## 2022-08-08 MED ORDER — EMPAGLIFLOZIN 10 MG PO TABS: 10.0000 mg | ORAL_TABLET | Freq: Every day | ORAL | 3 refills | Status: DC

## 2022-08-08 MED ORDER — ISOSORBIDE DINITRATE 30 MG PO TABS: 30.0000 mg | ORAL_TABLET | Freq: Three times a day (TID) | ORAL | 3 refills | Status: DC

## 2022-08-08 MED ORDER — METOPROLOL SUCCINATE ER 100 MG PO TB24: 50.0000 mg | ORAL_TABLET | Freq: Every day | ORAL | 3 refills | Status: DC

## 2022-08-08 MED ORDER — HYDRALAZINE HCL 50 MG PO TABS: 50.0000 mg | ORAL_TABLET | Freq: Three times a day (TID) | ORAL | 3 refills | Status: DC

## 2022-08-08 MED ORDER — AMLODIPINE BESYLATE 10 MG PO TABS: 10.0000 mg | ORAL_TABLET | Freq: Every day | ORAL | 3 refills | Status: DC

## 2022-08-08 MED ORDER — ATORVASTATIN CALCIUM 40 MG PO TABS: 40.0000 mg | ORAL_TABLET | Freq: Every day | ORAL | 3 refills | Status: DC

## 2022-08-08 MED ORDER — ENTRESTO 49-51 MG PO TABS: 1.0000 | ORAL_TABLET | Freq: Two times a day (BID) | ORAL | 3 refills | Status: DC

## 2022-08-08 NOTE — Progress Notes (Signed)
Follow up visit  Subjective:   Arthur [name], male    DOB: 01-May-1961, 62 y.o.   MRN: [id number]   HPI  Chief Complaint  Patient presents with   HFrEF   Hypertension   Follow-up    54 month    62 year old African-American male with hypertension, type 2 diabetes mellitus, h/o alcohol abuse, nonobstructive CAD, HFrEF, nonischemic cardiomyopathy, seizure disorder.  Patient is doing well. He denies chest pain, shortness of breath, palpitations, leg edema, orthopnea, PND, TIA/syncope. Reviewed recent test results with the patient, details below. He is not drinking alcohol anymore.      Current Outpatient Medications:    amLODipine (NORVASC) 10 MG tablet, Take 1 tablet (10 mg total) by mouth daily., Disp: 90 tablet, Rfl: 3   aspirin EC 81 MG tablet, Take 1 tablet (81 mg total) by mouth daily. Swallow whole., Disp: 90 tablet, Rfl: 3   aspirin EC 81 MG tablet, Take 1 tablet (81 mg total) by mouth daily. Swallow whole., Disp: 90 tablet, Rfl: 3   atorvastatin (LIPITOR) 40 MG tablet, Take 1 tablet (40 mg total) by mouth daily., Disp: 90 tablet, Rfl: 3   cholecalciferol (VITAMIN D) 25 MCG (1000 UNIT) tablet, Take by mouth., Disp: 90 tablet, Rfl: 3   empagliflozin (JARDIANCE) 10 MG TABS tablet, Take 1 tablet (10 mg total) by mouth daily before breakfast., Disp: 30 tablet, Rfl: 0   hydrALAZINE (APRESOLINE) 50 MG tablet, Take 1 tablet (50 mg total) by mouth every 8 (eight) hours., Disp: 270 tablet, Rfl: 3   isosorbide dinitrate (ISORDIL) 30 MG tablet, Take 1 tablet (30 mg total) by mouth 3 (three) times daily., Disp: 270 tablet, Rfl: 3   lacosamide (VIMPAT) 200 MG TABS tablet, Take 1 tablet (200 mg total) by mouth 2 (two) times daily., Disp: 180 tablet, Rfl: 1   levETIRAcetam (KEPPRA) 750 MG tablet, Take 2 tablets (1,500 mg total) by mouth 2 (two) times daily., Disp: 360 tablet, Rfl: 3   metFORMIN (GLUCOPHAGE) 500 MG tablet, TAKE 2 TABLETS (1000 MG) BY MOUTH TWICE A DAY WITH A MEAL (2AM+2PM),  Disp: 120 tablet, Rfl: 11   metoprolol tartrate (LOPRESSOR) 50 MG tablet, Take 1 tablet (50 mg total) by mouth 2 (two) times daily., Disp: 180 tablet, Rfl: 3   nicotine (NICODERM CQ - DOSED IN MG/24 HOURS) 14 mg/24hr patch, Place 1 patch (14 mg total) onto the skin daily., Disp: 30 patch, Rfl: 0   sacubitril-valsartan (ENTRESTO) 49-51 MG, Take 1 tablet by mouth 2 (two) times daily. Start on 06/29/2021, Disp: 180 tablet, Rfl: 3   Blood Pressure Monitor KIT, Use to check blood pressure dailly., Disp: 1 kit, Rfl: 0   Cardiovascular & other pertient studies:  Reviewed external labs and tests, independently interpreted  EKG 08/08/2022: Sinus rhythm 56 bpm Normal EKG  Echocardiogram 01/17/2022:  Normal LV systolic function with visual EF 60-65%. Left ventricle size is  decreased. Severe concentric hypertrophy of the left ventricle. Normal  global wall motion. Doppler evidence of grade I (impaired) diastolic  dysfunction, normal LAP. Calculated EF 66%.  Left atrial cavity is normal in size. 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  Right ventricle cavity is normal in size. Mild concentric hypertrophy of  the right ventricle. Normal right ventricular function.  Structurally normal trileaflet aortic valve.  Trace aortic regurgitation.  Structurally normal tricuspid valve.  Mild tricuspid regurgitation. No  evidence of pulmonary hypertension.  no prior available for comparison  Coronary angiogram 06/27/2021: Nonobstructive CAD in LAD and RCA Unable to perform OM RFR evaluation due to severe angulated Lcx takeoff Severe disease in medium sized diag 2. This alone does not explain his cardiomyopathy.  There is no obstructive disease in basal inferior wall, which is usually supplied by RCA.   He doe snot have any angina symptoms at this time. Dyspnea is mild. 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 I do not think this will change with  diag revascularization at this time.    Recommend continued management of HFrEF. Change lisinopril 20 mg to Entresto 49-51 mg bid. If EF remains low in spite of optimal GDMT for heart failure, and if dyspnea persists, could then consider diag revascularization for possible angina equivalent dyspnea.   Recent labs: 07/10/2022: Glucose 92, BUN/Cr 10/0.91. EGFR 97. Na/K 138/4.4.  H/H 13/43. MCV 78. Platelets 268  02/2021: Chol 147, TG 127, HDL 36, LDL 86   Review of Systems  Cardiovascular:  Negative for chest pain, dyspnea on exertion, leg swelling, palpitations and syncope.        Vitals:   08/08/22 0955  BP: 132/86  Pulse: 66  Resp: 16  SpO2: 96%    Body mass index is 23.23 kg/m. Filed Weights   08/08/22 0955  Weight: 127 lb (57.6 kg)     Objective:   Physical Exam Vitals and nursing note reviewed.  Constitutional:      General: He is not in acute distress. Neck:     Vascular: No JVD.  Cardiovascular:     Rate and Rhythm: Normal rate and regular rhythm.     Heart sounds: Normal heart sounds. No murmur heard. Pulmonary:     Effort: Pulmonary effort is normal.     Breath sounds: Normal breath sounds. No wheezing or rales.  Musculoskeletal:     Right lower leg: No edema.     Left lower leg: No edema.             Visit diagnoses:   ICD-10-CM   1. HFrEF (heart failure with reduced ejection fraction) (HCC)  I50.20 EKG 12-Lead    2. Essential hypertension  I10     3. Coronary artery disease involving native coronary artery of native heart without angina pectoris  I25.10         Assessment & Recommendations:    62 year old African-American male with hypertension, type 2 diabetes mellitus, h/o alcohol abuse, nonobstructive CAD, HFrEF, nonischemic cardiomyopathy, seizure disorder.  HFrEF: Euvolemic. EF recovered on echocardiogram (12/2021). Continue current GDMT for HFrEF.  Changed metoprolol tartrate 50 mg bid to metoprolol succinate 50 mg  daily. Refilled Entresto 49-51 mg bid, Isordil 30 mg tid, hydralazine 50 mg tid. Jardiance 10 mg daily. Cautioned against alcohol use.  CAD: Nonobstrcutvie. Continue Aspirin, statin  Hypertension: Controlled. Also on amlodipine 10 mg daily in additin to above meds.   F/u in 1 year    Nigel Mormon, MD Pager: [phone] Office: [phone]

## 2022-08-09 DIAGNOSIS — R4189 Other symptoms and signs involving cognitive functions and awareness: Secondary | ICD-10-CM | POA: Diagnosis not present

## 2022-08-10 DIAGNOSIS — R4189 Other symptoms and signs involving cognitive functions and awareness: Secondary | ICD-10-CM | POA: Diagnosis not present

## 2022-08-13 DIAGNOSIS — R4189 Other symptoms and signs involving cognitive functions and awareness: Secondary | ICD-10-CM | POA: Diagnosis not present

## 2022-08-14 DIAGNOSIS — R4189 Other symptoms and signs involving cognitive functions and awareness: Secondary | ICD-10-CM | POA: Diagnosis not present

## 2022-08-15 ENCOUNTER — Telehealth: Payer: Self-pay | Admitting: Student

## 2022-08-15 DIAGNOSIS — R4189 Other symptoms and signs involving cognitive functions and awareness: Secondary | ICD-10-CM | POA: Diagnosis not present

## 2022-08-15 NOTE — Telephone Encounter (Signed)
Received fax from Seth Bake at [facility] that Provider who completed PCS form needed to initial 3 additional lines under Optional Attestation. This was done and faxed back to Seth Bake today at 1138. Fax confirmation receipt received.   Attempted to notify Mickel Baas at [facility] that this was done. Left detailed message on her confidential VM that this had been done.

## 2022-08-15 NOTE — Telephone Encounter (Signed)
Opened in error

## 2022-08-16 DIAGNOSIS — R4189 Other symptoms and signs involving cognitive functions and awareness: Secondary | ICD-10-CM | POA: Diagnosis not present

## 2022-08-17 DIAGNOSIS — R4189 Other symptoms and signs involving cognitive functions and awareness: Secondary | ICD-10-CM | POA: Diagnosis not present

## 2022-08-20 DIAGNOSIS — R4189 Other symptoms and signs involving cognitive functions and awareness: Secondary | ICD-10-CM | POA: Diagnosis not present

## 2022-08-21 ENCOUNTER — Other Ambulatory Visit: Payer: Medicaid Other | Admitting: Obstetrics and Gynecology

## 2022-08-21 ENCOUNTER — Encounter: Payer: Self-pay | Admitting: Obstetrics and Gynecology

## 2022-08-21 DIAGNOSIS — R4189 Other symptoms and signs involving cognitive functions and awareness: Secondary | ICD-10-CM | POA: Diagnosis not present

## 2022-08-21 NOTE — Patient Instructions (Signed)
Visit Information  Mr. Socarras / [name] was given information about Medicaid Managed Care team care coordination services as a part of their [place] Medicaid benefit. Demarques A Bardon/ [name]  verbally consented to engagement with the [facility] Managed Care team.   If you are experiencing a medical emergency, please call 911 or report to your local emergency department or urgent care.   If you have a non-emergency medical problem during routine business hours, please contact your provider's office and ask to speak with a nurse.   For questions related to your [facility], please call: [phone] or visit the homepage here: [email or web address]  If you would like to schedule transportation through your [facility], please call the following number at least 2 days in advance of your appointment: [phone]   Rides for urgent appointments can also be made after hours by calling Member Services.  Call the [place] at [phone], at any time, 24 hours a day, 7 days a week. If you are in danger or need immediate medical attention call 911.  If you would like help to quit smoking, call 1-800-QUIT-NOW 226-574-7569) OR Espaol: 1-855-Djelo-Ya QO:409462) o para ms informacin haga clic aqu or Text READY to 200-400 to register via text  Mr. Cronen - following are the goals we discussed in your visit today:   Goals Addressed    Timeframe:  Long-Range Goal Priority:  High Start Date:    12/05/21                         Expected End Date:  ongoing                     Follow Up Date 10/08/22   - schedule appointment for vaccines needed due to my age or health - schedule recommended health tests - schedule and keep appointment for annual check-up    Why is this important?   Screening tests can find diseases early when they are easier  to treat.  Your doctor or nurse will talk with you about which tests are important for you.  Getting shots for common diseases like the flu and shingles will help prevent them.   08/21/22:  Patient seen and evaluated by CARDS 3/13  Patient / DPR verbalizes understanding of instructions and care plan provided today and agrees to view in [place]. Active MyChart status and patient / DPR understanding of how to access instructions and care plan via MyChart confirmed with patient/ DPR    The Managed Medicaid care management team will reach out to the patient / DPR again over the next 30 business  days.  The  [employer] (DPR) has been provided with contact information for the Managed Medicaid care management team and has been advised to call with any health related questions or concerns.   [name] RN, BSN [place] Management Coordinator - Managed Medicaid High Risk [phone]   Following is a copy of your plan of care:  Care Plan : [place] of Care  Updates made by Gayla Medicus, RN since 08/21/2022 12:00 AM     Problem: Chronic Disease Management and Care Coordination Needs for DMII, HTN, CAD, Seizures   Priority: High     Long-Range Goal: Development of Plan of Care for Chronic Disease Management and Care Coordination Needs (CAD, HTN, DMII, Seizures)  Start Date: 08/08/2021  Expected End Date: 11/21/2022  Priority: High  Note:   Current Barriers:  Knowledge Deficits related to plan of care for management of CAD, HTN, DMII, and Seizures, tobacco use, HLD, h/o CVA Care Coordination needs related to Financial constraints related to affordable safe housing and utility payment, Housing barriers, Medication procurement, ADL IADL limitations, Literacy concerns, Cognitive Deficits, Memory Deficits, Inability to perform IADL's independently, and Lacks knowledge of community resource: safe & affordable Section 8 Housing, utility  payment assistance and dental care providers covered by [name].  Chronic Disease Management support and education needs related to CAD, HTN, DMII, and Seizures, tobacco use, HLD, h/o CVA Film/video editor.  Non-adherence to prescribed medication regimen Difficulty obtaining medications Cognitive Deficits No Advanced Directives in place Falls - Increased Potential for Falls 08/21/22:  [name] concerned about balance/ deconditioning  issues.  No change in smoking and has not tried nicotine patches. Occasional BP check-cannot recall readings-no blood sugar check.    RNCM Clinical Goal(s):  Patient/ DPR  will verbalize understanding of plan for management of CAD, HTN, DMII, and Seizures as evidenced by improved management of these chronic diseases. verbalize basic understanding of CAD, HTN, DMII, and Seizures disease process and self health management plan as evidenced by noted improvement of management of these chronic diseases. take all medications exactly as prescribed and will call provider for medication related questions as evidenced by being compliant with all medications    attend all scheduled medical appointments     demonstrate improved adherence to prescribed treatment plan for CAD, HTN, DMII, and Seizures as evidenced by overall improved management of these chronic diseases continue to work with Consulting civil engineer and/or Social Worker to address care management and care coordination needs related to CAD, HTN, DMII, and Seizures as evidenced by adherence to CM Team Scheduled appointments     work with pharmacist to address Financial constraints related to obtaining medications and Medication procurement related to CAD, HTN, DMII, and Seizures as evidenced by review of EMR and patient or pharmacist report    work with Education officer, museum to address Financial constraints related to safe affordable Housing and utility payment , Housing barriers, ADL IADL limitations,  Cognitive Deficits, Memory Deficits, Inability to perform IADL's independently, and Lacks knowledge of community resource: safe and affordable Section 8 Housing, utility payment assistance and dental care providers covered by [employer] insurance related to the management of CAD, HTN, DMII, and Seizures as evidenced by review of EMR and patient or Education officer, museum report through collaboration with Consulting civil engineer, provider, and care team.   Interventions: Inter-disciplinary care team collaboration (see longitudinal plan of care) Evaluation of current treatment plan related to  self management and patient's adherence to plan as established by provider BSW referral for Podiatry resources-completed Collaborated with BSW. Pharmacy referral for medication assistance-completed Collaborated with Pharmacy  CAD  (Status: Goal on Track (progressing): YES.) Long Term Goal  Assessed understanding of CAD diagnosis Medications reviewed including medications utilized in CAD treatment plan Provided education on importance of blood pressure control in management of CAD; Counseled on the importance of exercise goals with target of 150 minutes per week Reviewed Importance of taking all medications as prescribed Reviewed Importance of attending all scheduled provider appointments Assessed social determinant of health barriers;  Diabetes:  (Status: Goal on Track (progressing): YES.) Long Term Goal  Lab Results  Component Value Date   HGBA1C 7.1 (H) 09/05/2021   HGBA1C=5.9 on 12/21/21  HGBA1C=5.6  on 07/23/22 Assessed patient's / DPR's understanding of A1c goal: <7% Provided education to patient / DPR about basic DM disease process; Reviewed medications with patient and discussed importance of medication adherence;        Discussed plans with patient / DPR for ongoing care management follow up and provided patient with direct contact information for care management team;      Reviewed scheduled/upcoming  provider appointments        Referral made to pharmacy team for assistance with complex medication regimen; [facility] Pharmacist involved in care;       Referral made to social work team for assistance with housing and utility payment assistance, dental care providers covered by Managed Medicaid; [facility] BSW involved in care;      Assessed social determinant of health barriers;        [name] denies any episodes or any signs/symptoms of Hypoglycemia or Hyperglycemia.   Seizures  (Status: Goal on Track (progressing): YES.) Long Term Goal  Evaluation of current treatment plan related to  Seizures ,  and any seizure activity,  self-management and patient's / DPR's adherence to plan as established by provider. Discussed plans with patient / DPR for ongoing care management follow up and provided patient / DPR with direct contact information for care management team Advised patient / DPR to report any seizure activity to PCP; Reviewed medications with patient and discussed importance of medication compliance; Reviewed scheduled/upcoming provider appointments  Social Work referral for Housing, utility payment and dental care providers-completed Pharmacy referral for complex medication regimen; Assessed social determinant of health barriers;  [name] reports no recent seizures.  Patient currently has all medications per patient's wife.  Hypertension: (Status: Goal on Track (progressing): YES.) Long Term Goal  Last practice recorded BP readings:  BP Readings from Last 3 Encounters:  09/05/21 128/78  08/28/21 132/78  07/11/21 128/74     02/15/22-102/73    07/10/22- 177/101 Most recent eGFR/CrCl:  Lab Results  Component Value Date   EGFR 92 06/06/2021    No components found for: CRCL  Evaluation of current treatment plan related to hypertension self management and patient's / DPR's adherence to plan as established by provider;   Reviewed prescribed diet Low Salt, Heart Healthy Reviewed  medications with patient / DPR and discussed importance of compliance  Provided assistance with obtaining home blood pressure monitor via contacting  [place].  [facility] staff confirmed that blood pressure monitor is ready.  I requested they deliver the monitor to patient's new home.  [facility] will deliver the monitor.;  Counseled on the importance of exercise goals with target of 150 minutes per week Discussed plans with patient for ongoing care management follow up and provided patient / DPR with direct contact information for care management team; Reviewed scheduled/upcoming provider appointments Assessed social determinant of health barriers;   Patient Goals/Self-Care Activities: Take medications as prescribed   Attend all scheduled provider appointments Call pharmacy for medication refills 3-7 days in advance of running out of medications Call provider office for new concerns or questions  Work with the social worker to address care coordination needs and will continue to work with the clinical team to address health care and disease management related needs [name] to contact PCP for shower chair and PT.

## 2022-08-21 NOTE — Patient Outreach (Addendum)
Medicaid Managed Care   Nurse Care Manager Note  08/21/2022 Name:  Arthur [name] MRN:  [id number] DOB:  05/22/1961  Arthur [name] is an 62 y.o. year old male who is a primary patient of Arthur Aurora, DO.  The [facility] Managed Care Coordination team was consulted for assistance with:    Chronic healthcare management needs, tobacco use, HTN, DM, HLD, CAD, h/o CVA, seizures, h/o ETOH abuse.  Mr. Lefrancois / Ms. Martinex, DPR was given information about Medicaid Managed Care Coordination team services today. Shelley A Dry/ [name]/DPR  Environmental consultant (DPR) agreed to services and verbal consent obtained.  Engaged with patient/ DPR  by telephone for follow up visit in response to provider referral for case management and/or care coordination services.   Assessments/Interventions:  Review of past medical history, allergies, medications, health status, including review of consultants reports, laboratory and other test data, was performed as part of comprehensive evaluation and provision of chronic care management services.  SDOH (Social Determinants of Health) assessments and interventions performed: SDOH Interventions    Flowsheet Row Patient Outreach Telephone from 08/21/2022 in [place] Patient Outreach Telephone from 07/23/2022 in [place] Patient Outreach Telephone from 06/25/2022 in [place] Patient Outreach Telephone from 05/29/2022 in [place] Patient Outreach Telephone from 04/25/2022 in [place] Patient Outreach Telephone from 03/23/2022 in [place] Coordination  SDOH Interventions        Food Insecurity Interventions -- -- -- -- -- Other (Comment)  [food stamps received]  Housing Interventions -- -- -- -- Inpatient TOC  [no housing concerns at this time] --  Transportation Interventions -- -- --  Intervention Not Indicated -- --  Utilities Interventions -- Intervention Not Indicated -- -- -- --  Alcohol Usage Interventions -- -- Intervention Not Indicated (Score <7) -- -- --  Financial Strain Interventions -- Other (Comment)  [patient has been given resources, DPR] -- -- -- --  Physical Activity Interventions Other (Comments)  [patient deconditioned, balance issues per DPR, patient's wife to f/u with provider] -- -- -- -- Intervention Not Indicated  Stress Interventions -- -- Intervention Not Indicated -- -- --     Care Plan  Allergies  Allergen Reactions   Penicillins Hives    Did it involve swelling of the face/tongue/throat, SOB, or low BP? Y Did it involve sudden or severe rash/hives, skin peeling, or any reaction on the inside of your mouth or nose? N Did you need to seek medical attention at a hospital or doctor's office? Y When did it last happen?  Over 5 Years Ago     If all above answers are "NO", may proceed with cephalosporin use.    Medications Reviewed Today     Reviewed by Gayla Medicus, RN (Registered Nurse) on 08/21/22 at [place] List Status: <None>   Medication Order Taking? Sig Documenting Provider Last Dose Status Informant  amLODipine (NORVASC) 10 MG tablet [id number]  Take 1 tablet (10 mg total) by mouth daily. Nigel Mormon, MD  Active   aspirin EC 81 MG tablet [id number] No Take 1 tablet (81 mg total) by mouth daily. Swallow whole. [name], [name], PA-C Taking Active   aspirin EC 81 MG tablet [id number] No Take 1 tablet (81 mg total) by mouth daily. Swallow whole. Nigel Mormon, MD Taking Active   atorvastatin (LIPITOR) 40 MG tablet [id number]  Take  1 tablet (40 mg total) by mouth daily. Nigel Mormon, MD  Active   Blood Pressure Monitor KIT [id number] No Use to check blood pressure dailly. [name], MD Taking Active   cholecalciferol (VITAMIN D) 25 MCG (1000 UNIT) tablet [id number] No Take by mouth.  Taking Active    empagliflozin (JARDIANCE) 10 MG TABS tablet [id number]  Take 1 tablet (10 mg total) by mouth daily before breakfast. Patwardhan, Reynold Bowen, MD  Active   hydrALAZINE (APRESOLINE) 50 MG tablet [id number]  Take 1 tablet (50 mg total) by mouth every 8 (eight) hours. Patwardhan, Reynold Bowen, MD  Active   isosorbide dinitrate (ISORDIL) 30 MG tablet [id number]  Take 1 tablet (30 mg total) by mouth 3 (three) times daily. Patwardhan, Reynold Bowen, MD  Active   lacosamide (VIMPAT) 200 MG TABS tablet [id number] No Take 1 tablet (200 mg total) by mouth 2 (two) times daily. Penumalli, Earlean Polka, MD Taking Active   levETIRAcetam (KEPPRA) 750 MG tablet [id number] No Take 2 tablets (1,500 mg total) by mouth 2 (two) times daily. Penni Bombard, MD Taking Active   metFORMIN (GLUCOPHAGE) 500 MG tablet [id number] No TAKE 2 TABLETS (1000 MG) BY MOUTH TWICE A DAY WITH A MEAL (2AM+2PM) Arthur Aurora, DO Taking Active   metoprolol succinate (TOPROL-XL) 100 MG 24 hr tablet [id number]  Take 0.5 tablets (50 mg total) by mouth daily. Take with or immediately following a meal. Patwardhan, Manish J, MD  Active   sacubitril-valsartan (ENTRESTO) 49-51 MG [id number]  Take 1 tablet by mouth 2 (two) times daily. Start on 06/29/2021 Nigel Mormon, MD  Active   Med List Note Arthur [name], CPhT 02/24/21 1756): [place]: "12/08/20 receiving PAP through [employer], approved until 12/08/2022"           Patient Active Problem List   Diagnosis Date Noted   Healthcare maintenance 07/11/2022   Tobacco use disorder 07/11/2022   Pain due to onychomycosis of toenails of both feet 07/05/2022   Porokeratosis 07/05/2022   Vitamin D deficiency 09/07/2021   Cognitive impairment 09/06/2021   Coronary artery disease    HFrEF (heart failure with reduced ejection fraction) ([place]) 05/10/2021   Seizures ([place]) 02/24/2021   Aortic atherosclerosis ([place]) 11/22/2020   History of seizures 11/22/2020   Alcohol use disorder, severe, dependence ([place]) 11/22/2020    Controlled type 2 diabetes mellitus with microalbuminuria, without long-term current use of insulin ([place]) 11/22/2020   Anemia 11/22/2020   Hyperlipidemia 11/22/2020   Essential hypertension 10/08/2019   History of CVA (cerebrovascular accident) 10/02/2019   Conditions to be addressed/monitored per PCP order:  Chronic healthcare management needs, tobacco use, HTN, DM, HLD, CAD, h/o CVA, seizures, h/o ETOH abuse.  Care Plan : RN Care Manager Plan of Care  Updates made by Gayla Medicus, RN since 08/21/2022 12:00 AM     Problem: Chronic Disease Management and Care Coordination Needs for DMII, HTN, CAD, Seizures   Priority: High     Long-Range Goal: Development of Plan of Care for Chronic Disease Management and Care Coordination Needs (CAD, HTN, DMII, Seizures)   Start Date: 08/08/2021  Expected End Date: 11/21/2022  Priority: High  Note:   Current Barriers:  Knowledge Deficits related to plan of care for management of CAD, HTN, DMII, and Seizures, tobacco use, HLD, h/o CVA Care Coordination needs related to Financial constraints related to affordable safe housing and utility payment, Housing barriers, Medication procurement, ADL IADL limitations, Literacy concerns, Cognitive Deficits, Memory Deficits, Inability to perform  IADL's independently, and Lacks knowledge of community resource: safe & affordable Section 8 Housing, utility payment assistance and dental care providers covered by [name].  Chronic Disease Management support and education needs related to CAD, HTN, DMII, and Seizures, tobacco use, HLD, h/o CVA Film/video editor.  Non-adherence to prescribed medication regimen Difficulty obtaining medications Cognitive Deficits No Advanced Directives in place Falls - Increased Potential for Falls 08/21/22:  [name] concerned about balance/ deconditioning  issues.  No change in smoking and has not tried nicotine patches. Occasional BP check-cannot recall  readings-no blood sugar check.    RNCM Clinical Goal(s):  Patient/ DPR  will verbalize understanding of plan for management of CAD, HTN, DMII, and Seizures as evidenced by improved management of these chronic diseases. verbalize basic understanding of CAD, HTN, DMII, and Seizures disease process and self health management plan as evidenced by noted improvement of management of these chronic diseases. take all medications exactly as prescribed and will call provider for medication related questions as evidenced by being compliant with all medications    attend all scheduled medical appointments     demonstrate improved adherence to prescribed treatment plan for CAD, HTN, DMII, and Seizures as evidenced by overall improved management of these chronic diseases continue to work with Consulting civil engineer and/or Social Worker to address care management and care coordination needs related to CAD, HTN, DMII, and Seizures as evidenced by adherence to CM Team Scheduled appointments     work with pharmacist to address Financial constraints related to obtaining medications and Medication procurement related to CAD, HTN, DMII, and Seizures as evidenced by review of EMR and patient or pharmacist report    work with Education officer, museum to address Financial constraints related to safe affordable Housing and utility payment , Housing barriers, ADL IADL limitations, Cognitive Deficits, Memory Deficits, Inability to perform IADL's independently, and Lacks knowledge of community resource: safe and affordable Section 8 Housing, utility payment assistance and dental care providers covered by [employer] insurance related to the management of CAD, HTN, DMII, and Seizures as evidenced by review of EMR and patient or Education officer, museum report through collaboration with Consulting civil engineer, provider, and care team.   Interventions: Inter-disciplinary care team collaboration (see longitudinal plan of care) Evaluation of current treatment plan  related to  self management and patient's adherence to plan as established by provider BSW referral for Podiatry resources-completed Collaborated with BSW. Pharmacy referral for medication assistance-completed Collaborated with Pharmacy  CAD  (Status: Goal on Track (progressing): YES.) Long Term Goal  Assessed understanding of CAD diagnosis Medications reviewed including medications utilized in CAD treatment plan Provided education on importance of blood pressure control in management of CAD; Counseled on the importance of exercise goals with target of 150 minutes per week Reviewed Importance of taking all medications as prescribed Reviewed Importance of attending all scheduled provider appointments Assessed social determinant of health barriers;  Diabetes:  (Status: Goal on Track (progressing): YES.) Long Term Goal  Lab Results  Component Value Date   HGBA1C 7.1 (H) 09/05/2021   HGBA1C=5.9 on 12/21/21  HGBA1C=5.6 on 07/23/22 Assessed patient's / DPR's understanding of A1c goal: <7% Provided education to patient / DPR about basic DM disease process; Reviewed medications with patient and discussed importance of medication adherence;        Discussed plans with patient / DPR for ongoing care management follow up and provided patient with direct contact information for care management team;      Reviewed scheduled/upcoming  provider appointments        Referral made to pharmacy team for assistance with complex medication regimen; [facility] Pharmacist involved in care;       Referral made to social work team for assistance with housing and utility payment assistance, dental care providers covered by Managed Medicaid; [facility] BSW involved in care;      Assessed social determinant of health barriers;        [name] denies any episodes or any signs/symptoms of Hypoglycemia or Hyperglycemia.   Seizures  (Status: Goal on Track (progressing): YES.) Long Term Goal  Evaluation of current treatment plan  related to  Seizures ,  and any seizure activity,  self-management and patient's / DPR's adherence to plan as established by provider. Discussed plans with patient / DPR for ongoing care management follow up and provided patient / DPR with direct contact information for care management team Advised patient / DPR to report any seizure activity to PCP; Reviewed medications with patient and discussed importance of medication compliance; Reviewed scheduled/upcoming provider appointments  Social Work referral for Housing, utility payment and dental care providers-completed Pharmacy referral for complex medication regimen; Assessed social determinant of health barriers;  [name] reports no recent seizures.  Patient currently has all medications per patient's wife.  Hypertension: (Status: Goal on Track (progressing): YES.) Long Term Goal  Last practice recorded BP readings:  BP Readings from Last 3 Encounters:  09/05/21 128/78  08/28/21 132/78  07/11/21 128/74     02/15/22-102/73    07/10/22- 177/101 Most recent eGFR/CrCl:  Lab Results  Component Value Date   EGFR 92 06/06/2021    No components found for: CRCL  Evaluation of current treatment plan related to hypertension self management and patient's / DPR's adherence to plan as established by provider;   Reviewed prescribed diet Low Salt, Heart Healthy Reviewed medications with patient / DPR and discussed importance of compliance  Provided assistance with obtaining home blood pressure monitor via contacting  [place].  [facility] staff confirmed that blood pressure monitor is ready.  I requested they deliver the monitor to patient's new home.  [facility] will deliver the monitor.;  Counseled on the importance of exercise goals with target of 150 minutes per week Discussed plans with patient for ongoing care management follow up and provided patient / DPR with direct contact information for care management team; Reviewed  scheduled/upcoming provider appointments Assessed social determinant of health barriers;   Patient Goals/Self-Care Activities: Take medications as prescribed   Attend all scheduled provider appointments Call pharmacy for medication refills 3-7 days in advance of running out of medications Call provider office for new concerns or questions  Work with the social worker to address care coordination needs and will continue to work with the clinical team to address health care and disease management related needs [name] to contact PCP for shower chair and PT.   Long-Range Goal: Establish Plan of Care for Chronic Disease Management Needs   Priority: High  Note:   Timeframe:  Long-Range Goal Priority:  High Start Date:    12/05/21                         Expected End Date:  ongoing                     Follow Up Date 10/08/22   - schedule appointment for vaccines needed due to my age or health - schedule  recommended health tests - schedule and keep appointment for annual check-up    Why is this important?   Screening tests can find diseases early when they are easier to treat.  Your doctor or nurse will talk with you about which tests are important for you.  Getting shots for common diseases like the flu and shingles will help prevent them.   08/21/22:  Patient seen and evaluated by CARDS 3/13   Follow Up:  Patient / DPR agrees to Care Plan and Follow-up.  Plan: The Managed Medicaid care management team will reach out to the patient / DPR again over the next 30 business  days. and The  [employer] (DPR) has been provided with contact information for the Managed Medicaid care management team and has been advised to call with any health related questions or concerns.  Date/time of next scheduled RN care management/care coordination outreach: 10/08/22 at 230

## 2022-08-22 DIAGNOSIS — R4189 Other symptoms and signs involving cognitive functions and awareness: Secondary | ICD-10-CM | POA: Diagnosis not present

## 2022-08-23 DIAGNOSIS — R4189 Other symptoms and signs involving cognitive functions and awareness: Secondary | ICD-10-CM | POA: Diagnosis not present

## 2022-08-24 DIAGNOSIS — R4189 Other symptoms and signs involving cognitive functions and awareness: Secondary | ICD-10-CM | POA: Diagnosis not present

## 2022-08-27 DIAGNOSIS — R4189 Other symptoms and signs involving cognitive functions and awareness: Secondary | ICD-10-CM | POA: Diagnosis not present

## 2022-08-28 DIAGNOSIS — R4189 Other symptoms and signs involving cognitive functions and awareness: Secondary | ICD-10-CM | POA: Diagnosis not present

## 2022-08-29 DIAGNOSIS — R4189 Other symptoms and signs involving cognitive functions and awareness: Secondary | ICD-10-CM | POA: Diagnosis not present

## 2022-08-31 DIAGNOSIS — R4189 Other symptoms and signs involving cognitive functions and awareness: Secondary | ICD-10-CM | POA: Diagnosis not present

## 2022-09-03 DIAGNOSIS — R4189 Other symptoms and signs involving cognitive functions and awareness: Secondary | ICD-10-CM | POA: Diagnosis not present

## 2022-09-04 ENCOUNTER — Telehealth: Payer: Self-pay | Admitting: *Deleted

## 2022-09-04 DIAGNOSIS — R4189 Other symptoms and signs involving cognitive functions and awareness: Secondary | ICD-10-CM | POA: Diagnosis not present

## 2022-09-04 NOTE — Telephone Encounter (Signed)
Spoke with Mr. Larew regarding his PCS assessment / patient stated he had the assessment this month but could not remember date. PCS agency [facility] [phone], spoke with [facility]. Per Ted patient is receiving 60 per month. Office will call back if the patient could get approved for more hours.

## 2022-09-05 DIAGNOSIS — R4189 Other symptoms and signs involving cognitive functions and awareness: Secondary | ICD-10-CM | POA: Diagnosis not present

## 2022-09-06 DIAGNOSIS — R4189 Other symptoms and signs involving cognitive functions and awareness: Secondary | ICD-10-CM | POA: Diagnosis not present

## 2022-09-07 DIAGNOSIS — R4189 Other symptoms and signs involving cognitive functions and awareness: Secondary | ICD-10-CM | POA: Diagnosis not present

## 2022-09-08 DIAGNOSIS — R4189 Other symptoms and signs involving cognitive functions and awareness: Secondary | ICD-10-CM | POA: Diagnosis not present

## 2022-09-09 DIAGNOSIS — R4189 Other symptoms and signs involving cognitive functions and awareness: Secondary | ICD-10-CM | POA: Diagnosis not present

## 2022-09-10 DIAGNOSIS — R4189 Other symptoms and signs involving cognitive functions and awareness: Secondary | ICD-10-CM | POA: Diagnosis not present

## 2022-09-11 DIAGNOSIS — R4189 Other symptoms and signs involving cognitive functions and awareness: Secondary | ICD-10-CM | POA: Diagnosis not present

## 2022-09-12 DIAGNOSIS — R4189 Other symptoms and signs involving cognitive functions and awareness: Secondary | ICD-10-CM | POA: Diagnosis not present

## 2022-09-13 DIAGNOSIS — R4189 Other symptoms and signs involving cognitive functions and awareness: Secondary | ICD-10-CM | POA: Diagnosis not present

## 2022-09-14 DIAGNOSIS — R4189 Other symptoms and signs involving cognitive functions and awareness: Secondary | ICD-10-CM | POA: Diagnosis not present

## 2022-09-15 DIAGNOSIS — R4189 Other symptoms and signs involving cognitive functions and awareness: Secondary | ICD-10-CM | POA: Diagnosis not present

## 2022-09-16 DIAGNOSIS — R4189 Other symptoms and signs involving cognitive functions and awareness: Secondary | ICD-10-CM | POA: Diagnosis not present

## 2022-09-17 DIAGNOSIS — R4189 Other symptoms and signs involving cognitive functions and awareness: Secondary | ICD-10-CM | POA: Diagnosis not present

## 2022-09-18 DIAGNOSIS — R4189 Other symptoms and signs involving cognitive functions and awareness: Secondary | ICD-10-CM | POA: Diagnosis not present

## 2022-09-19 DIAGNOSIS — R4189 Other symptoms and signs involving cognitive functions and awareness: Secondary | ICD-10-CM | POA: Diagnosis not present

## 2022-09-20 DIAGNOSIS — R4189 Other symptoms and signs involving cognitive functions and awareness: Secondary | ICD-10-CM | POA: Diagnosis not present

## 2022-09-21 DIAGNOSIS — R4189 Other symptoms and signs involving cognitive functions and awareness: Secondary | ICD-10-CM | POA: Diagnosis not present

## 2022-09-22 DIAGNOSIS — R4189 Other symptoms and signs involving cognitive functions and awareness: Secondary | ICD-10-CM | POA: Diagnosis not present

## 2022-09-23 DIAGNOSIS — R4189 Other symptoms and signs involving cognitive functions and awareness: Secondary | ICD-10-CM | POA: Diagnosis not present

## 2022-09-24 DIAGNOSIS — R4189 Other symptoms and signs involving cognitive functions and awareness: Secondary | ICD-10-CM | POA: Diagnosis not present

## 2022-09-25 DIAGNOSIS — R4189 Other symptoms and signs involving cognitive functions and awareness: Secondary | ICD-10-CM | POA: Diagnosis not present

## 2022-09-26 DIAGNOSIS — R4189 Other symptoms and signs involving cognitive functions and awareness: Secondary | ICD-10-CM | POA: Diagnosis not present

## 2022-09-27 DIAGNOSIS — R4189 Other symptoms and signs involving cognitive functions and awareness: Secondary | ICD-10-CM | POA: Diagnosis not present

## 2022-09-28 DIAGNOSIS — R4189 Other symptoms and signs involving cognitive functions and awareness: Secondary | ICD-10-CM | POA: Diagnosis not present

## 2022-09-29 DIAGNOSIS — R4189 Other symptoms and signs involving cognitive functions and awareness: Secondary | ICD-10-CM | POA: Diagnosis not present

## 2022-09-30 DIAGNOSIS — R4189 Other symptoms and signs involving cognitive functions and awareness: Secondary | ICD-10-CM | POA: Diagnosis not present

## 2022-10-01 DIAGNOSIS — R4189 Other symptoms and signs involving cognitive functions and awareness: Secondary | ICD-10-CM | POA: Diagnosis not present

## 2022-10-02 DIAGNOSIS — R4189 Other symptoms and signs involving cognitive functions and awareness: Secondary | ICD-10-CM | POA: Diagnosis not present

## 2022-10-03 DIAGNOSIS — R4189 Other symptoms and signs involving cognitive functions and awareness: Secondary | ICD-10-CM | POA: Diagnosis not present

## 2022-10-04 DIAGNOSIS — R4189 Other symptoms and signs involving cognitive functions and awareness: Secondary | ICD-10-CM | POA: Diagnosis not present

## 2022-10-08 ENCOUNTER — Other Ambulatory Visit: Payer: Medicaid Other | Admitting: Obstetrics and Gynecology

## 2022-10-08 ENCOUNTER — Encounter: Payer: Self-pay | Admitting: Obstetrics and Gynecology

## 2022-10-08 NOTE — Patient Instructions (Signed)
Visit Information  Mr. Brooks /s. Bradly Bienenstock was given information about Medicaid Managed Care team care coordination services as a part of their [facility] Community Plan Medicaid benefit. Janard A Scardina/ Ms. Bradly Bienenstock agreed   to engagement with the [facility] Managed Care team.   If you are experiencing a medical emergency, please call 911 or report to your local emergency department or urgent care.   If you have a non-emergency medical problem during routine business hours, please contact your provider's office and ask to speak with a nurse.   For questions related to your [facility], please call: [phone] or visit the homepage here: [email or web address]  If you would like to schedule transportation through your [facility], please call the following number at least 2 days in advance of your appointment: [phone]   Rides for urgent appointments can also be made after hours by calling Member Services.  Call the Behavioral Health Crisis Line at [phone], at any time, 24 hours a day, 7 days a week. If you are in danger or need immediate medical attention call 911.  If you would like help to quit smoking, call 1-800-QUIT-NOW (9291927287) OR Espaol: 1-855-Djelo-Ya (3-664-403-4742) o para ms informacin haga clic aqu or Text READY to 595-638 to register via text  Mr. Antonovich / Ms. Bradly Bienenstock - following are the goals we discussed in your visit today:   Goals Addressed    Timeframe:  Long-Range Goal Priority:  High Start Date:    12/05/21                         Expected End Date:  ongoing                     Follow Up Date 12/07/22   - schedule appointment for vaccines needed due to my age or health - schedule recommended health tests - schedule and keep appointment for annual check-up    Why is this important?   Screening tests can find diseases early when they are  easier to treat.  Your doctor or nurse will talk with you about which tests are important for you.  Getting shots for common diseases like the flu and shingles will help prevent them.   10/08/22:  To schedule Podiatry appt  Patient / DPR verbalizes understanding of instructions and care plan provided today and agrees to view in MyChart. Active MyChart status and patient/ DPR understanding of how to access instructions and care plan via MyChart confirmed with patient/DPR      The Managed Medicaid care management team will reach out to the patient /DPR again over the next 60 business  days.  The  [name] has been provided with contact information for the Managed Medicaid care management team and has been advised to call with any health related questions or concerns.   Kathi Der RN, BSN [place]  [facility] Care Management Coordinator - Managed Medicaid High Risk [phone]   Following is a copy of your plan of care:  Care Plan : RN Care Manager Plan of Care  Updates made by Danie Chandler, RN since 10/08/2022 12:00 AM     Problem: Chronic Disease Management and Care Coordination Needs for DMII, HTN, CAD, Seizures   Priority: High     Long-Range Goal: Development of Plan of Care for Chronic Disease Management and Care Coordination Needs (CAD, HTN, DMII, Seizures)  Start Date: 08/08/2021  Expected End Date: 01/08/2023  Priority: High  Note:   Current Barriers:  Knowledge Deficits related to plan of care for management of CAD, HTN, DMII, and Seizures, tobacco use, HLD, h/o CVA Care Coordination needs related to Financial constraints related to affordable safe housing and utility payment, Housing barriers, Medication procurement, ADL IADL limitations, Literacy concerns, Cognitive Deficits, Memory Deficits, Inability to perform IADL's independently, and Lacks knowledge of community resource: safe & affordable Section 8 Housing, utility payment  assistance and dental care providers covered by [employer]  Chronic Disease Management support and education needs related to CAD, HTN, DMII, and Seizures, tobacco use, HLD, h/o CVA Corporate treasurer.  Non-adherence to prescribed medication regimen Difficulty obtaining medications Cognitive Deficits No Advanced Directives in place Falls - Increased Potential for Falls 10/08/22:  No concerns today-patient's wife states balance has improved.  To schedule Podiatry appointment.no  BP or BG readings available.  No seizure activity.  Smokes 3 cigars a day-no Nicotine patch trial yet. Taking all meds.  RNCM Clinical Goal(s):  Patient/ DPR  will verbalize understanding of plan for management of CAD, HTN, DMII, and Seizures as evidenced by improved management of these chronic diseases. verbalize basic understanding of CAD, HTN, DMII, and Seizures disease process and self health management plan as evidenced by noted improvement of management of these chronic diseases. take all medications exactly as prescribed and will call provider for medication related questions as evidenced by being compliant with all medications    attend all scheduled medical appointments     demonstrate improved adherence to prescribed treatment plan for CAD, HTN, DMII, and Seizures as evidenced by overall improved management of these chronic diseases continue to work with Medical illustrator and/or Social Worker to address care management and care coordination needs related to CAD, HTN, DMII, and Seizures as evidenced by adherence to CM Team Scheduled appointments     work with pharmacist to address Financial constraints related to obtaining medications and Medication procurement related to CAD, HTN, DMII, and Seizures as evidenced by review of EMR and patient or pharmacist report    work with Child psychotherapist to address Financial constraints related to safe affordable Housing and utility payment , Housing barriers, ADL  IADL limitations, Cognitive Deficits, Memory Deficits, Inability to perform IADL's independently, and Lacks knowledge of community resource: safe and affordable Section 8 Housing, utility payment assistance and dental care providers covered by [employer] insurance related to the management of CAD, HTN, DMII, and Seizures as evidenced by review of EMR and patient or Child psychotherapist report through collaboration with Medical illustrator, provider, and care team.   Interventions: Inter-disciplinary care team collaboration (see longitudinal plan of care) Evaluation of current treatment plan related to  self management and patient's adherence to plan as established by provider BSW referral for Podiatry resources-completed Collaborated with BSW. Pharmacy referral for medication assistance-completed Collaborated with Pharmacy  CAD  (Status: Goal on Track (progressing): YES.) Long Term Goal  Assessed understanding of CAD diagnosis Medications reviewed including medications utilized in CAD treatment plan Provided education on importance of blood pressure control in management of CAD; Counseled on the importance of exercise goals with target of 150 minutes per week Reviewed Importance of taking all medications as prescribed Reviewed Importance of attending all scheduled provider appointments Assessed social determinant of health barriers;  Diabetes:  (Status: Goal on Track (progressing): YES.) Long Term Goal  Lab Results  Component Value Date   HGBA1C 7.1 (H) 09/05/2021  HGBA1C=5.9 on 12/21/21  HGBA1C=5.6 on 07/23/22  Assessed patient's / DPR's understanding of A1c goal: <7% Provided education to patient / DPR about basic DM disease process; Reviewed medications with patient and discussed importance of medication adherence;        Discussed plans with patient / DPR for ongoing care management follow up and provided patient with direct contact information for care management team;      Reviewed  scheduled/upcoming provider appointments        Referral made to pharmacy team for assistance with complex medication regimen; [facility] Pharmacist involved in care;       Referral made to social work team for assistance with housing and utility payment assistance, dental care providers covered by Managed Medicaid; [employer] BSW involved in care;      Assessed social determinant of health barriers;        [name] denies any episodes or any signs/symptoms of Hypoglycemia or Hyperglycemia.   Seizures  (Status: Goal on Track (progressing): YES.) Long Term Goal  Evaluation of current treatment plan related to  Seizures ,  and any seizure activity,  self-management and patient's / DPR's adherence to plan as established by provider. Discussed plans with patient / DPR for ongoing care management follow up and provided patient / DPR with direct contact information for care management team Advised patient / DPR to report any seizure activity to PCP; Reviewed medications with patient and discussed importance of medication compliance; Reviewed scheduled/upcoming provider appointments  Social Work referral for Housing, utility payment and dental care providers-completed Pharmacy referral for complex medication regimen; Assessed social determinant of health barriers;  [name] reports no recent seizures.  Patient currently has all medications per patient's wife.  Hypertension: (Status: Goal on Track (progressing): YES.) Long Term Goal  Last practice recorded BP readings:  BP Readings from Last 3 Encounters:  09/05/21 128/78  08/28/21 132/78  07/11/21 128/74     02/15/22-102/73    07/10/22- 177/101  Most recent eGFR/CrCl:  Lab Results  Component Value Date   EGFR 92 06/06/2021    No components found for: CRCL  Evaluation of current treatment plan related to hypertension self management and patient's / DPR's adherence to plan as established by provider;   Reviewed prescribed diet Low Salt, Heart  Healthy Reviewed medications with patient / DPR and discussed importance of compliance  Provided assistance with obtaining home blood pressure monitor via contacting  [facility].  [facility] staff confirmed that blood pressure monitor is ready.  I requested they deliver the monitor to patient's new home.  [facility] will deliver the monitor.;  Counseled on the importance of exercise goals with target of 150 minutes per week Discussed plans with patient for ongoing care management follow up and provided patient / DPR with direct contact information for care management team; Reviewed scheduled/upcoming provider appointments Assessed social determinant of health barriers;   Patient Goals/Self-Care Activities: Take medications as prescribed   Attend all scheduled provider appointments Call pharmacy for medication refills 3-7 days in advance of running out of medications Call provider office for new concerns or questions  Work with the social worker to address care coordination needs and will continue to work with the clinical team to address health care and disease management related needs [name] to contact PCP for shower chair and PT. [name] to schedule Podiatry appointment

## 2022-10-08 NOTE — Patient Outreach (Signed)
Medicaid Managed Care   Nurse Care Manager Note  10/08/2022 Name:  Arthur [name] MRN:  [id number] DOB:  [birth year]  Arthur [name] is an 62 y.o. year old male who is a primary patient of Modena Slater, DO.  The [facility] Managed Care Coordination team was consulted for assistance with:    Chronic healthcare management needs, seizures, h/o ETOH abuse, tobacco use, HTN, DM, HLD, CAD, h/o CVA  Arthur [name] was given information about Medicaid Managed Care Coordination team services today. [name] Patient agreed to services and verbal consent obtained.  Engaged with patient by telephone for follow up visit in response to provider referral for case management and/or care coordination services.   Assessments/Interventions:  Review of past medical history, allergies, medications, health status, including review of consultants reports, laboratory and other test data, was performed as part of comprehensive evaluation and provision of chronic care management services.  SDOH (Social Determinants of Health) assessments and interventions performed: SDOH Interventions    Flowsheet Row Patient Outreach Telephone from 10/08/2022 in [place] POPULATION HEALTH DEPARTMENT Patient Outreach Telephone from 08/21/2022 in [place] POPULATION HEALTH DEPARTMENT Patient Outreach Telephone from 07/23/2022 in [place] POPULATION HEALTH DEPARTMENT Patient Outreach Telephone from 06/25/2022 in [place] POPULATION HEALTH DEPARTMENT Patient Outreach Telephone from 05/29/2022 in [place] POPULATION HEALTH DEPARTMENT Patient Outreach Telephone from 04/25/2022 in [place] POPULATION HEALTH DEPARTMENT  SDOH Interventions        Food Insecurity Interventions Intervention Not Indicated -- -- -- -- --  Housing Interventions Intervention Not Indicated -- -- -- -- Inpatient TOC  [no housing concerns at this time]  Transportation Interventions -- -- -- -- Intervention Not Indicated --  Utilities Interventions -- -- Intervention  Not Indicated -- -- --  Alcohol Usage Interventions -- -- -- Intervention Not Indicated (Score <7) -- --  Financial Strain Interventions -- -- Other (Comment)  [patient has been given resources, DPR] -- -- --  Physical Activity Interventions -- Other (Comments)  [patient deconditioned, balance issues per DPR, patient's wife to f/u with provider] -- -- -- --  Stress Interventions -- -- -- Intervention Not Indicated -- --     Care Plan  Allergies  Allergen Reactions   Penicillins Hives    Did it involve swelling of the face/tongue/throat, SOB, or low BP? Y Did it involve sudden or severe rash/hives, skin peeling, or any reaction on the inside of your mouth or nose? N Did you need to seek medical attention at a hospital or doctor's office? Y When did it last happen?  Over 5 Years Ago     If all above answers are "NO", may proceed with cephalosporin use.     Medications Reviewed Today     Reviewed by Danie Chandler, RN (Registered Nurse) on 10/08/22 at 1505  Med List Status: <None>   Medication Order Taking? Sig Documenting Provider Last Dose Status Informant  amLODipine (NORVASC) 10 MG tablet [id number]  Take 1 tablet (10 mg total) by mouth daily. Elder [name], MD  Active   aspirin EC 81 MG tablet [id number] No Take 1 tablet (81 mg total) by mouth daily. Swallow whole. [name], [name], PA-C Taking Active   aspirin EC 81 MG tablet [id number] No Take 1 tablet (81 mg total) by mouth daily. Swallow whole. Patwardhan, Anabel Bene, MD Taking Active   atorvastatin (LIPITOR) 40 MG tablet [id number]  Take 1 tablet (40 mg total) by mouth daily. Elder [name], MD  Active  Blood Pressure Monitor KIT [id number] No Use to check blood pressure dailly. [name], MD Taking Active   cholecalciferol (VITAMIN D) 25 MCG (1000 UNIT) tablet [id number] No Take by mouth.  Taking Active   empagliflozin (JARDIANCE) 10 MG TABS tablet [id number]  Take 1 tablet (10 mg total) by mouth daily before  breakfast. Patwardhan, Anabel Bene, MD  Active   hydrALAZINE (APRESOLINE) 50 MG tablet [id number]  Take 1 tablet (50 mg total) by mouth every 8 (eight) hours. Patwardhan, Anabel Bene, MD  Active   isosorbide dinitrate (ISORDIL) 30 MG tablet [id number]  Take 1 tablet (30 mg total) by mouth 3 (three) times daily. Patwardhan, Anabel Bene, MD  Active   lacosamide (VIMPAT) 200 MG TABS tablet [id number] No Take 1 tablet (200 mg total) by mouth 2 (two) times daily. Penumalli, Glenford Bayley, MD Taking Active   levETIRAcetam (KEPPRA) 750 MG tablet [id number] No Take 2 tablets (1,500 mg total) by mouth 2 (two) times daily. Suanne Marker, MD Taking Active   metFORMIN (GLUCOPHAGE) 500 MG tablet [id number] No TAKE 2 TABLETS (1000 MG) BY MOUTH TWICE A DAY WITH A MEAL (2AM+2PM) Modena Slater, DO Taking Active   metoprolol succinate (TOPROL-XL) 100 MG 24 hr tablet [id number]  Take 0.5 tablets (50 mg total) by mouth daily. Take with or immediately following a meal. Patwardhan, Manish J, MD  Active   sacubitril-valsartan (ENTRESTO) 49-51 MG [id number]  Take 1 tablet by mouth 2 (two) times daily. Start on 06/29/2021 Elder [name], MD  Active   Med List Note [name], CPhT 02/24/21 1756): [place]: "12/08/20 receiving PAP through [employer], approved until 12/08/2022"           Patient Active Problem List   Diagnosis Date Noted   Healthcare maintenance 07/11/2022   Tobacco use disorder 07/11/2022   Pain due to onychomycosis of toenails of both feet 07/05/2022   Porokeratosis 07/05/2022   Vitamin D deficiency 09/07/2021   Cognitive impairment 09/06/2021   Coronary artery disease    HFrEF (heart failure with reduced ejection fraction) (HCC) 05/10/2021   Seizures (HCC) 02/24/2021   Aortic atherosclerosis (HCC) 11/22/2020   History of seizures 11/22/2020   Alcohol use disorder, severe, dependence (HCC) 11/22/2020   Controlled type 2 diabetes mellitus with microalbuminuria, without long-term current use of insulin (HCC)  11/22/2020   Anemia 11/22/2020   Hyperlipidemia 11/22/2020   Essential hypertension 10/08/2019   History of CVA (cerebrovascular accident) 10/02/2019   Conditions to be addressed/monitored per PCP order:  Chronic healthcare management needs, seizures, h/o ETOH abuse, tobacco use, HTN, DM, HLD, CAD, h/o CVA  Care Plan : RN Care Manager Plan of Care  Updates made by Danie Chandler, RN since 10/08/2022 12:00 AM     Problem: Chronic Disease Management and Care Coordination Needs for DMII, HTN, CAD, Seizures   Priority: High     Long-Range Goal: Development of Plan of Care for Chronic Disease Management and Care Coordination Needs (CAD, HTN, DMII, Seizures)   Start Date: 08/08/2021  Expected End Date: 01/08/2023  Priority: High  Note:   Current Barriers:  Knowledge Deficits related to plan of care for management of CAD, HTN, DMII, and Seizures, tobacco use, HLD, h/o CVA Care Coordination needs related to Financial constraints related to affordable safe housing and utility payment, Housing barriers, Medication procurement, ADL IADL limitations, Literacy concerns, Cognitive Deficits, Memory Deficits, Inability to perform IADL's independently, and Lacks knowledge of community resource: safe & affordable Section 8 Housing, utility payment  assistance and dental care providers covered by Managed Medicaid insurance.  Chronic Disease Management support and education needs related to CAD, HTN, DMII, and Seizures, tobacco use, HLD, h/o CVA Corporate treasurer.  Non-adherence to prescribed medication regimen Difficulty obtaining medications Cognitive Deficits No Advanced Directives in place Falls - Increased Potential for Falls 10/08/22:  No concerns today-patient's wife states balance has improved.  To schedule Podiatry appointment.no  BP or BG readings available.  No seizure activity.  Smokes 3 cigars a day-no Nicotine patch trial yet. Taking all meds.  RNCM Clinical Goal(s):  Patient/ DPR  will  verbalize understanding of plan for management of CAD, HTN, DMII, and Seizures as evidenced by improved management of these chronic diseases. verbalize basic understanding of CAD, HTN, DMII, and Seizures disease process and self health management plan as evidenced by noted improvement of management of these chronic diseases. take all medications exactly as prescribed and will call provider for medication related questions as evidenced by being compliant with all medications    attend all scheduled medical appointments     demonstrate improved adherence to prescribed treatment plan for CAD, HTN, DMII, and Seizures as evidenced by overall improved management of these chronic diseases continue to work with Medical illustrator and/or Social Worker to address care management and care coordination needs related to CAD, HTN, DMII, and Seizures as evidenced by adherence to CM Team Scheduled appointments     work with pharmacist to address Financial constraints related to obtaining medications and Medication procurement related to CAD, HTN, DMII, and Seizures as evidenced by review of EMR and patient or pharmacist report    work with Child psychotherapist to address Financial constraints related to safe affordable Housing and utility payment , Housing barriers, ADL IADL limitations, Cognitive Deficits, Memory Deficits, Inability to perform IADL's independently, and Lacks knowledge of community resource: safe and affordable Section 8 Housing, utility payment assistance and dental care providers covered by [employer] insurance related to the management of CAD, HTN, DMII, and Seizures as evidenced by review of EMR and patient or Child psychotherapist report through collaboration with Medical illustrator, provider, and care team.   Interventions: Inter-disciplinary care team collaboration (see longitudinal plan of care) Evaluation of current treatment plan related to  self management and patient's adherence to plan as established by  provider BSW referral for Podiatry resources-completed Collaborated with BSW. Pharmacy referral for medication assistance-completed Collaborated with Pharmacy  CAD  (Status: Goal on Track (progressing): YES.) Long Term Goal  Assessed understanding of CAD diagnosis Medications reviewed including medications utilized in CAD treatment plan Provided education on importance of blood pressure control in management of CAD; Counseled on the importance of exercise goals with target of 150 minutes per week Reviewed Importance of taking all medications as prescribed Reviewed Importance of attending all scheduled provider appointments Assessed social determinant of health barriers;  Diabetes:  (Status: Goal on Track (progressing): YES.) Long Term Goal  Lab Results  Component Value Date   HGBA1C 7.1 (H) 09/05/2021   HGBA1C=5.9 on 12/21/21  HGBA1C=5.6 on 07/23/22  Assessed patient's / DPR's understanding of A1c goal: <7% Provided education to patient / DPR about basic DM disease process; Reviewed medications with patient and discussed importance of medication adherence;        Discussed plans with patient / DPR for ongoing care management follow up and provided patient with direct contact information for care management team;      Reviewed scheduled/upcoming provider appointments  Referral made to pharmacy team for assistance with complex medication regimen; [facility] Pharmacist involved in care;       Referral made to social work team for assistance with housing and utility payment assistance, dental care providers covered by Managed Medicaid; [facility] BSW involved in care;      Assessed social determinant of health barriers;        [name] denies any episodes or any signs/symptoms of Hypoglycemia or Hyperglycemia.   Seizures  (Status: Goal on Track (progressing): YES.) Long Term Goal  Evaluation of current treatment plan related to  Seizures ,  and any seizure activity,  self-management and  patient's / DPR's adherence to plan as established by provider. Discussed plans with patient / DPR for ongoing care management follow up and provided patient / DPR with direct contact information for care management team Advised patient / DPR to report any seizure activity to PCP; Reviewed medications with patient and discussed importance of medication compliance; Reviewed scheduled/upcoming provider appointments  Social Work referral for Housing, utility payment and dental care providers-completed Pharmacy referral for complex medication regimen; Assessed social determinant of health barriers;  [name] reports no recent seizures.  Patient currently has all medications per patient's wife.  Hypertension: (Status: Goal on Track (progressing): YES.) Long Term Goal  Last practice recorded BP readings:  BP Readings from Last 3 Encounters:  09/05/21 128/78  08/28/21 132/78  07/11/21 128/74     02/15/22-102/73    07/10/22- 177/101  Most recent eGFR/CrCl:  Lab Results  Component Value Date   EGFR 92 06/06/2021    No components found for: CRCL  Evaluation of current treatment plan related to hypertension self management and patient's / DPR's adherence to plan as established by provider;   Reviewed prescribed diet Low Salt, Heart Healthy Reviewed medications with patient / DPR and discussed importance of compliance  Provided assistance with obtaining home blood pressure monitor via contacting  [facility].  [facility] staff confirmed that blood pressure monitor is ready.  I requested they deliver the monitor to patient's new home.  [facility] will deliver the monitor.;  Counseled on the importance of exercise goals with target of 150 minutes per week Discussed plans with patient for ongoing care management follow up and provided patient / DPR with direct contact information for care management team; Reviewed scheduled/upcoming provider appointments Assessed social  determinant of health barriers;   Patient Goals/Self-Care Activities: Take medications as prescribed   Attend all scheduled provider appointments Call pharmacy for medication refills 3-7 days in advance of running out of medications Call provider office for new concerns or questions  Work with the social worker to address care coordination needs and will continue to work with the clinical team to address health care and disease management related needs [name] to contact PCP for shower chair and PT. [name] to schedule Podiatry appointment    Long-Range Goal: Establish Plan of Care for Chronic Disease Management Needs   Priority: High  Note:   Timeframe:  Long-Range Goal Priority:  High Start Date:    12/05/21                         Expected End Date:  ongoing                     Follow Up Date 12/07/22   - schedule appointment for vaccines needed due to my age or health - schedule recommended  health tests - schedule and keep appointment for annual check-up    Why is this important?   Screening tests can find diseases early when they are easier to treat.  Your doctor or nurse will talk with you about which tests are important for you.  Getting shots for common diseases like the flu and shingles will help prevent them.   10/08/22:  To schedule Podiatry appt   Follow Up:  Patient / DPR agrees to Care Plan and Follow-up.  Plan: The Managed Medicaid care management team will reach out to the patient / DPR again over the next 60 business  days. and The  [name] has been provided with contact information for the Managed Medicaid care management team and has been advised to call with any health related questions or concerns.  Date/time of next scheduled RN care management/care coordination outreach: 12/07/22 at 0900.

## 2022-10-09 ENCOUNTER — Other Ambulatory Visit: Payer: Self-pay | Admitting: Diagnostic Neuroimaging

## 2022-10-09 DIAGNOSIS — R4189 Other symptoms and signs involving cognitive functions and awareness: Secondary | ICD-10-CM | POA: Diagnosis not present

## 2022-10-10 DIAGNOSIS — R4189 Other symptoms and signs involving cognitive functions and awareness: Secondary | ICD-10-CM | POA: Diagnosis not present

## 2022-10-11 DIAGNOSIS — R4189 Other symptoms and signs involving cognitive functions and awareness: Secondary | ICD-10-CM | POA: Diagnosis not present

## 2022-10-12 DIAGNOSIS — R4189 Other symptoms and signs involving cognitive functions and awareness: Secondary | ICD-10-CM | POA: Diagnosis not present

## 2022-10-13 DIAGNOSIS — R4189 Other symptoms and signs involving cognitive functions and awareness: Secondary | ICD-10-CM | POA: Diagnosis not present

## 2022-10-14 DIAGNOSIS — R4189 Other symptoms and signs involving cognitive functions and awareness: Secondary | ICD-10-CM | POA: Diagnosis not present

## 2022-10-15 DIAGNOSIS — R4189 Other symptoms and signs involving cognitive functions and awareness: Secondary | ICD-10-CM | POA: Diagnosis not present

## 2022-10-16 DIAGNOSIS — R4189 Other symptoms and signs involving cognitive functions and awareness: Secondary | ICD-10-CM | POA: Diagnosis not present

## 2022-10-17 DIAGNOSIS — R4189 Other symptoms and signs involving cognitive functions and awareness: Secondary | ICD-10-CM | POA: Diagnosis not present

## 2022-10-18 DIAGNOSIS — R4189 Other symptoms and signs involving cognitive functions and awareness: Secondary | ICD-10-CM | POA: Diagnosis not present

## 2022-10-19 DIAGNOSIS — R4189 Other symptoms and signs involving cognitive functions and awareness: Secondary | ICD-10-CM | POA: Diagnosis not present

## 2022-10-20 DIAGNOSIS — R4189 Other symptoms and signs involving cognitive functions and awareness: Secondary | ICD-10-CM | POA: Diagnosis not present

## 2022-10-21 DIAGNOSIS — R4189 Other symptoms and signs involving cognitive functions and awareness: Secondary | ICD-10-CM | POA: Diagnosis not present

## 2022-10-22 DIAGNOSIS — R4189 Other symptoms and signs involving cognitive functions and awareness: Secondary | ICD-10-CM | POA: Diagnosis not present

## 2022-10-23 DIAGNOSIS — R4189 Other symptoms and signs involving cognitive functions and awareness: Secondary | ICD-10-CM | POA: Diagnosis not present

## 2022-10-24 DIAGNOSIS — R4189 Other symptoms and signs involving cognitive functions and awareness: Secondary | ICD-10-CM | POA: Diagnosis not present

## 2022-10-25 DIAGNOSIS — R4189 Other symptoms and signs involving cognitive functions and awareness: Secondary | ICD-10-CM | POA: Diagnosis not present

## 2022-10-26 DIAGNOSIS — R4189 Other symptoms and signs involving cognitive functions and awareness: Secondary | ICD-10-CM | POA: Diagnosis not present

## 2022-10-27 DIAGNOSIS — R4189 Other symptoms and signs involving cognitive functions and awareness: Secondary | ICD-10-CM | POA: Diagnosis not present

## 2022-10-28 DIAGNOSIS — R4189 Other symptoms and signs involving cognitive functions and awareness: Secondary | ICD-10-CM | POA: Diagnosis not present

## 2022-10-29 DIAGNOSIS — R4189 Other symptoms and signs involving cognitive functions and awareness: Secondary | ICD-10-CM | POA: Diagnosis not present

## 2022-10-30 DIAGNOSIS — R4189 Other symptoms and signs involving cognitive functions and awareness: Secondary | ICD-10-CM | POA: Diagnosis not present

## 2022-11-01 DIAGNOSIS — R4189 Other symptoms and signs involving cognitive functions and awareness: Secondary | ICD-10-CM | POA: Diagnosis not present

## 2022-11-02 DIAGNOSIS — R4189 Other symptoms and signs involving cognitive functions and awareness: Secondary | ICD-10-CM | POA: Diagnosis not present

## 2022-11-04 DIAGNOSIS — R4189 Other symptoms and signs involving cognitive functions and awareness: Secondary | ICD-10-CM | POA: Diagnosis not present

## 2022-11-05 DIAGNOSIS — R4189 Other symptoms and signs involving cognitive functions and awareness: Secondary | ICD-10-CM | POA: Diagnosis not present

## 2022-11-06 DIAGNOSIS — R4189 Other symptoms and signs involving cognitive functions and awareness: Secondary | ICD-10-CM | POA: Diagnosis not present

## 2022-11-07 DIAGNOSIS — R4189 Other symptoms and signs involving cognitive functions and awareness: Secondary | ICD-10-CM | POA: Diagnosis not present

## 2022-11-08 DIAGNOSIS — R4189 Other symptoms and signs involving cognitive functions and awareness: Secondary | ICD-10-CM | POA: Diagnosis not present

## 2022-11-09 DIAGNOSIS — R4189 Other symptoms and signs involving cognitive functions and awareness: Secondary | ICD-10-CM | POA: Diagnosis not present

## 2022-11-10 ENCOUNTER — Emergency Department (HOSPITAL_COMMUNITY)
Admission: EM | Admit: 2022-11-10 | Discharge: 2022-11-10 | Disposition: A | Discharge status: Home / Self Care | Payer: Medicaid Other | Attending: Emergency Medicine | Admitting: Emergency Medicine

## 2022-11-10 ENCOUNTER — Emergency Department (HOSPITAL_COMMUNITY): Payer: Medicaid Other

## 2022-11-10 ENCOUNTER — Other Ambulatory Visit: Payer: Self-pay

## 2022-11-10 DIAGNOSIS — R42 Dizziness and giddiness: Secondary | ICD-10-CM | POA: Diagnosis not present

## 2022-11-10 DIAGNOSIS — R4189 Other symptoms and signs involving cognitive functions and awareness: Secondary | ICD-10-CM | POA: Diagnosis not present

## 2022-11-10 DIAGNOSIS — Z7982 Long term (current) use of aspirin: Secondary | ICD-10-CM | POA: Diagnosis not present

## 2022-11-10 DIAGNOSIS — R55 Syncope and collapse: Secondary | ICD-10-CM | POA: Diagnosis not present

## 2022-11-10 DIAGNOSIS — I959 Hypotension, unspecified: Secondary | ICD-10-CM | POA: Diagnosis not present

## 2022-11-10 DIAGNOSIS — R1111 Vomiting without nausea: Secondary | ICD-10-CM | POA: Diagnosis not present

## 2022-11-10 DIAGNOSIS — R61 Generalized hyperhidrosis: Secondary | ICD-10-CM | POA: Diagnosis not present

## 2022-11-10 LAB — COMPREHENSIVE METABOLIC PANEL
ALT: 18 U/L (ref 0–44)
AST: 25 U/L (ref 15–41)
Albumin: 3.6 g/dL (ref 3.5–5.0)
Alkaline Phosphatase: 57 U/L (ref 38–126)
Anion gap: 14 (ref 5–15)
BUN: 11 mg/dL (ref 6–20)
CO2: 20 mmol/L — ABNORMAL LOW (ref 22–32)
Calcium: 9.1 mg/dL (ref 8.9–10.3)
Chloride: 103 mmol/L (ref 98–111)
Creatinine, Ser: 1.58 mg/dL — ABNORMAL HIGH (ref 0.61–1.24)
GFR, Estimated: 50 mL/min — ABNORMAL LOW (ref >60)
Glucose, Bld: 87 mg/dL (ref 70–99)
Potassium: 3.6 mmol/L (ref 3.5–5.1)
Sodium: 137 mmol/L (ref 135–145)
Total Bilirubin: 0.6 mg/dL (ref 0.3–1.2)
Total Protein: 6.5 g/dL (ref 6.5–8.1)

## 2022-11-10 LAB — CBC WITH DIFFERENTIAL/PLATELET
Abs Immature Granulocytes: 0.01 10*3/uL (ref 0.00–0.07)
Basophils Absolute: 0.1 10*3/uL (ref 0.0–0.1)
Basophils Relative: 1 %
Eosinophils Absolute: 0.2 10*3/uL (ref 0.0–0.5)
Eosinophils Relative: 4 %
HCT: 37.1 % — ABNORMAL LOW (ref 39.0–52.0)
Hemoglobin: 12.4 g/dL — ABNORMAL LOW (ref 13.0–17.0)
Immature Granulocytes: 0 %
Lymphocytes Relative: 45 %
Lymphs Abs: 2.3 10*3/uL (ref 0.7–4.0)
MCH: 25.8 pg — ABNORMAL LOW (ref 26.0–34.0)
MCHC: 33.4 g/dL (ref 30.0–36.0)
MCV: 77.3 fL — ABNORMAL LOW (ref 80.0–100.0)
Monocytes Absolute: 0.5 10*3/uL (ref 0.1–1.0)
Monocytes Relative: 9 %
Neutro Abs: 2.1 10*3/uL (ref 1.7–7.7)
Neutrophils Relative %: 41 %
Platelets: 225 10*3/uL (ref 150–400)
RBC: 4.8 MIL/uL (ref 4.22–5.81)
RDW: 15.5 % (ref 11.5–15.5)
WBC: 5.1 10*3/uL (ref 4.0–10.5)
nRBC: 0 % (ref 0.0–0.2)

## 2022-11-10 LAB — TROPONIN I (HIGH SENSITIVITY)
Troponin I (High Sensitivity): 7 ng/L (ref <18)
Troponin I (High Sensitivity): 7 ng/L (ref <18)

## 2022-11-10 LAB — CBG MONITORING, ED: Glucose-Capillary: 87 mg/dL (ref 70–99)

## 2022-11-10 LAB — ETHANOL: Alcohol, Ethyl (B): <10 mg/dL (ref <10)

## 2022-11-10 MED ORDER — SODIUM CHLORIDE 0.9 % IV BOLUS
500.0000 mL | Freq: Once | INTRAVENOUS | Status: AC
  Administered 2022-11-10: 500 mL via INTRAVENOUS

## 2022-11-10 NOTE — ED Notes (Signed)
Assumed care of patient here for syncopal episode and emesis . Patient a/o x 4 respirations even and non labored vs wnl has had no emesis here in ER and has had no further syncopal episodes.Is pending repeat lab work and re assessment by provider

## 2022-11-10 NOTE — ED Notes (Signed)
Patient given warm blankets.

## 2022-11-10 NOTE — ED Provider Notes (Signed)
[name] AT [facility] Provider Note   CSN: [id number] Arrival date & time: 11/10/22  1626     History  Chief Complaint  Patient presents with   Loss of Consciousness    Pt was outside per ems getting a haircut and barber noticed pt became diaphoraitc and had a syncopal episode. EMS reports bp of 80/40 pt had 2 episodes of vomiting. Pt was given 500 NS IV and 4mg  zofran pta. Possible ETOH.    Arthur [name] is a 62 y.o. male.  Reports he was standing outside getting a haircut and passed out.  Patient reports he has had similar episodes in the past.  Patient reports he has had seizures in the past.  Patient reports that this was not an unusual episode for him.  Patient reports that EMS came to the emergency department.  Patient reports he has not missed any of his medications.  He reports he feels back to normal.  He did not have any chest pain has not had any abdominal pain he denies any weakness.  Reports he is diabetic he has been eating normally.  Patient has been seen by cardiology in the past for evaluation of heart failure.  The history is provided by the patient. No language interpreter was used.  Loss of Consciousness Episode history:  Single      Home Medications Prior to Admission medications   Medication Sig Start Date End Date Taking? Authorizing Provider  amLODipine (NORVASC) 10 MG tablet Take 1 tablet (10 mg total) by mouth daily. 08/08/22   Patwardhan, Anabel Bene, MD  aspirin EC 81 MG tablet Take 1 tablet (81 mg total) by mouth daily. Swallow whole. 07/11/21   [name], [name], PA-C  aspirin EC 81 MG tablet Take 1 tablet (81 mg total) by mouth daily. Swallow whole. 04/25/22   Patwardhan, Anabel Bene, MD  atorvastatin (LIPITOR) 40 MG tablet Take 1 tablet (40 mg total) by mouth daily. 08/08/22   Patwardhan, Anabel Bene, MD  Blood Pressure Monitor KIT Use to check blood pressure dailly. 10/19/21   [name], MD  cholecalciferol (VITAMIN D) 25  MCG (1000 UNIT) tablet Take by mouth. 09/07/21     empagliflozin (JARDIANCE) 10 MG TABS tablet Take 1 tablet (10 mg total) by mouth daily before breakfast. 08/08/22   Patwardhan, Manish J, MD  hydrALAZINE (APRESOLINE) 50 MG tablet Take 1 tablet (50 mg total) by mouth every 8 (eight) hours. 08/08/22   Patwardhan, Anabel Bene, MD  isosorbide dinitrate (ISORDIL) 30 MG tablet Take 1 tablet (30 mg total) by mouth 3 (three) times daily. 08/08/22   Patwardhan, Anabel Bene, MD  lacosamide (VIMPAT) 200 MG TABS tablet TAKE 1 TABLET (200 MG TOTAL) BY MOUTH 2 (TWO) TIMES DAILY.(AM+PM) 10/11/22   Penumalli, Glenford Bayley, MD  levETIRAcetam (KEPPRA) 750 MG tablet Take 2 tablets (1,500 mg total) by mouth 2 (two) times daily. 04/25/22   Penumalli, Glenford Bayley, MD  metFORMIN (GLUCOPHAGE) 500 MG tablet TAKE 2 TABLETS (1000 MG) BY MOUTH TWICE A DAY WITH A MEAL (2AM+2PM) 05/29/22 05/24/23  Modena Slater, DO  metoprolol succinate (TOPROL-XL) 100 MG 24 hr tablet Take 0.5 tablets (50 mg total) by mouth daily. Take with or immediately following a meal. 08/08/22 07/28/24  Patwardhan, Manish J, MD  sacubitril-valsartan (ENTRESTO) 49-51 MG Take 1 tablet by mouth 2 (two) times daily. Start on 06/29/2021 08/08/22   Elder [name], MD      Allergies    Penicillins  Review of Systems   Review of Systems  Cardiovascular:  Positive for syncope.  All other systems reviewed and are negative.   Physical Exam Updated Vital Signs BP 126/75   Pulse 70   Temp 97.9 F (36.6 C) (Oral)   Resp (!) 30   Ht 5\' 3"  (1.6 m)   Wt 56.7 kg   SpO2 92%   BMI 22.14 kg/m  Physical Exam Vitals and nursing note reviewed.  Constitutional:      Appearance: He is well-developed.  HENT:     Head: Normocephalic.     Right Ear: External ear normal.     Left Ear: External ear normal.     Nose: Nose normal.     Mouth/Throat:     Mouth: Mucous membranes are moist.  Eyes:     Pupils: Pupils are equal, round, and reactive to light.  Cardiovascular:     Rate  and Rhythm: Normal rate.  Pulmonary:     Effort: Pulmonary effort is normal.  Abdominal:     General: There is no distension.  Musculoskeletal:        General: Normal range of motion.     Cervical back: Normal range of motion.  Skin:    General: Skin is warm.  Neurological:     General: No focal deficit present.     Mental Status: He is alert and oriented to person, place, and time.     ED Results / Procedures / Treatments   Labs (all labs ordered are listed, but only abnormal results are displayed) Labs Reviewed  CBC WITH DIFFERENTIAL/PLATELET - Abnormal; Notable for the following components:      Result Value   Hemoglobin 12.4 (*)    HCT 37.1 (*)    MCV 77.3 (*)    MCH 25.8 (*)    All other components within normal limits  COMPREHENSIVE METABOLIC PANEL - Abnormal; Notable for the following components:   CO2 20 (*)    Creatinine, Ser 1.58 (*)    GFR, Estimated 50 (*)    All other components within normal limits  ETHANOL  CBG MONITORING, ED  TROPONIN I (HIGH SENSITIVITY)  TROPONIN I (HIGH SENSITIVITY)    EKG EKG Interpretation  Date/Time:  Saturday November 10 2022 16:35:07 EDT Ventricular Rate:  70 PR Interval:  159 QRS Duration: 97 QT Interval:  381 QTC Calculation: 412 R Axis:   42 Text Interpretation: Sinus rhythm Nonspecific T abnormalities, inferior leads inferior and lateral TWI are new no concerning ST elevation noted Confirmed by Derwood Kaplan ([id number]) on 11/10/2022 5:40:36 PM  Radiology CT Head Wo Contrast  Result Date: 11/10/2022 CLINICAL DATA:  Syncope/presyncope. Cerebrovascular cause suspected. EXAM: CT HEAD WITHOUT CONTRAST TECHNIQUE: Contiguous axial images were obtained from the base of the skull through the vertex without intravenous contrast. RADIATION DOSE REDUCTION: This exam was performed according to the departmental dose-optimization program which includes automated exposure control, adjustment of the mA and/or kV according to patient size  and/or use of iterative reconstruction technique. COMPARISON:  MR head without contrast 03/03/21. CT head without contrast 02/24/2021. FINDINGS: Brain: Remote left parietal lobe infarct is stable. Remote infarct involving the right internal capsule and lentiform nucleus is stable. Moderate diffuse white matter disease is stable. No acute infarct, hemorrhage, or mass lesion is present. The ventricles are proportionate to the degree of atrophy. A cavum septum pellucidum is present. Brainstem and cerebellum are within normal limits. Midline structures are within normal limits. Vascular: Atherosclerotic calcifications are present in  the left cavernous internal carotid artery. No hyperdense vessel is present. Skull: Calvarium is intact. No focal lytic or blastic lesions are present. No significant extracranial soft tissue lesion is present. Sinuses/Orbits: Mild mucosal thickening is present in the maxillary sinuses bilaterally. The paranasal sinuses and mastoid air cells are otherwise clear. The globes and orbits are within normal limits. IMPRESSION: 1. No acute intracranial abnormality or significant interval change. 2. Stable remote left parietal lobe infarct. 3. Stable remote infarct involving the right internal capsule and lentiform nucleus. 4. Stable moderate diffuse white matter disease. This likely reflects the sequela of chronic microvascular ischemia. Electronically Signed   By: Marin Roberts M.D.   On: 11/10/2022 19:06    Procedures Procedures    Medications Ordered in ED Medications  sodium chloride 0.9 % bolus 500 mL (0 mLs Intravenous Stopped 11/10/22 1845)    ED Course/ Medical Decision Making/ A&P                             Medical Decision Making Patient has a past medical history of seizures.  Patient reports he was standing today getting his haircut and passed out.  She reports this has happened to him in the past.  He denies any area of pain he does not feel like he has any  injuries  Amount and/or Complexity of Data Reviewed Independent Historian: EMS    Details: Brought in by EMS.  He has returned to his baseline by the time they evaluated him External Data Reviewed: notes.    Details: Primary care notes reviewed Labs: ordered. Decision-making details documented in ED Course.    Details: Abs ordered reviewed and interpreted and hemoglobin is 12.4 Radiology: ordered and independent interpretation performed. Decision-making details documented in ED Course.    Details:  CT head no acute findings ECG/medicine tests: ordered and independent interpretation performed. Decision-making details documented in ED Course.    Details: EKG ordered reviewed and interpreted patient has nonspecific T wave inversions. Discussion of management or test interpretation with external provider(s): I discussed the patient with cardiology fellow who advised outpatient follow-up  Risk Risk Details: Patient is reevaluated he reports he is feeling at his baseline.  Patient states that he has family who will pick him up in a family member who can watch him.  I suspect patient had a breakthrough seizure.  I have advised him to follow-up with his primary care physician as well as cardiology for recheck.  He is to return to the emergency department if any problems           Final Clinical Impression(s) / ED Diagnoses Final diagnoses:  Near syncope    Rx / DC Orders ED Discharge Orders     None      An After Visit Summary was printed and given to the patient.    Osie Cheeks 11/10/22 2032    Derwood Kaplan, MD 11/10/22 2045

## 2022-11-10 NOTE — ED Notes (Signed)
Repeat trop re drawn and sent to lab

## 2022-11-10 NOTE — Discharge Instructions (Addendum)
Follow up with cardiology for recheck.  Return if any problems.

## 2022-11-11 DIAGNOSIS — R4189 Other symptoms and signs involving cognitive functions and awareness: Secondary | ICD-10-CM | POA: Diagnosis not present

## 2022-11-12 DIAGNOSIS — R4189 Other symptoms and signs involving cognitive functions and awareness: Secondary | ICD-10-CM | POA: Diagnosis not present

## 2022-11-13 DIAGNOSIS — R4189 Other symptoms and signs involving cognitive functions and awareness: Secondary | ICD-10-CM | POA: Diagnosis not present

## 2022-11-14 DIAGNOSIS — R4189 Other symptoms and signs involving cognitive functions and awareness: Secondary | ICD-10-CM | POA: Diagnosis not present

## 2022-11-15 DIAGNOSIS — R4189 Other symptoms and signs involving cognitive functions and awareness: Secondary | ICD-10-CM | POA: Diagnosis not present

## 2022-11-16 DIAGNOSIS — R4189 Other symptoms and signs involving cognitive functions and awareness: Secondary | ICD-10-CM | POA: Diagnosis not present

## 2022-11-17 DIAGNOSIS — R4189 Other symptoms and signs involving cognitive functions and awareness: Secondary | ICD-10-CM | POA: Diagnosis not present

## 2022-11-18 DIAGNOSIS — R4189 Other symptoms and signs involving cognitive functions and awareness: Secondary | ICD-10-CM | POA: Diagnosis not present

## 2022-11-19 DIAGNOSIS — R4189 Other symptoms and signs involving cognitive functions and awareness: Secondary | ICD-10-CM | POA: Diagnosis not present

## 2022-11-20 DIAGNOSIS — R4189 Other symptoms and signs involving cognitive functions and awareness: Secondary | ICD-10-CM | POA: Diagnosis not present

## 2022-11-21 DIAGNOSIS — R4189 Other symptoms and signs involving cognitive functions and awareness: Secondary | ICD-10-CM | POA: Diagnosis not present

## 2022-11-22 DIAGNOSIS — R4189 Other symptoms and signs involving cognitive functions and awareness: Secondary | ICD-10-CM | POA: Diagnosis not present

## 2022-11-23 DIAGNOSIS — R4189 Other symptoms and signs involving cognitive functions and awareness: Secondary | ICD-10-CM | POA: Diagnosis not present

## 2022-11-24 DIAGNOSIS — R4189 Other symptoms and signs involving cognitive functions and awareness: Secondary | ICD-10-CM | POA: Diagnosis not present

## 2022-11-25 DIAGNOSIS — R4189 Other symptoms and signs involving cognitive functions and awareness: Secondary | ICD-10-CM | POA: Diagnosis not present

## 2022-11-26 DIAGNOSIS — R4189 Other symptoms and signs involving cognitive functions and awareness: Secondary | ICD-10-CM | POA: Diagnosis not present

## 2022-11-27 DIAGNOSIS — R4189 Other symptoms and signs involving cognitive functions and awareness: Secondary | ICD-10-CM | POA: Diagnosis not present

## 2022-11-28 DIAGNOSIS — R4189 Other symptoms and signs involving cognitive functions and awareness: Secondary | ICD-10-CM | POA: Diagnosis not present

## 2022-11-30 DIAGNOSIS — R4189 Other symptoms and signs involving cognitive functions and awareness: Secondary | ICD-10-CM | POA: Diagnosis not present

## 2022-12-01 DIAGNOSIS — R4189 Other symptoms and signs involving cognitive functions and awareness: Secondary | ICD-10-CM | POA: Diagnosis not present

## 2022-12-02 DIAGNOSIS — R4189 Other symptoms and signs involving cognitive functions and awareness: Secondary | ICD-10-CM | POA: Diagnosis not present

## 2022-12-03 DIAGNOSIS — R4189 Other symptoms and signs involving cognitive functions and awareness: Secondary | ICD-10-CM | POA: Diagnosis not present

## 2022-12-05 DIAGNOSIS — R4189 Other symptoms and signs involving cognitive functions and awareness: Secondary | ICD-10-CM | POA: Diagnosis not present

## 2022-12-06 DIAGNOSIS — R4189 Other symptoms and signs involving cognitive functions and awareness: Secondary | ICD-10-CM | POA: Diagnosis not present

## 2022-12-07 ENCOUNTER — Encounter: Payer: Self-pay | Admitting: Obstetrics and Gynecology

## 2022-12-07 ENCOUNTER — Other Ambulatory Visit: Payer: Medicaid Other | Admitting: Obstetrics and Gynecology

## 2022-12-07 NOTE — Patient Outreach (Signed)
Medicaid Managed Care   Nurse Care Manager Note  12/07/2022 Name:  Arthur [name] MRN:  [id number] DOB:  1961/01/14  Arthur [name] is an 62 y.o. year old male who is a primary patient of Arthur Slater, DO.  The [facility] Managed Care Coordination team was consulted for assistance with:    Chronic healthcare management needs, h/o ETOH abuse, h/o seizures, h/o CVA, HTN, DM, HLD, CAD, tobacco use , cognitive impairment  Mr. Call was given information about Medicaid Managed Care Coordination team services today. [name] Patient agreed to services and verbal consent obtained.  Engaged with patient by telephone for follow up visit in response to provider referral for case management and/or care coordination services.   Assessments/Interventions:  Review of past medical history, allergies, medications, health status, including review of consultants reports, laboratory and other test data, was performed as part of comprehensive evaluation and provision of chronic care management services.  SDOH (Social Determinants of Health) assessments and interventions performed: SDOH Interventions    Flowsheet Row Patient Outreach Telephone from 12/07/2022 in [place] POPULATION HEALTH DEPARTMENT Patient Outreach Telephone from 10/08/2022 in [place] POPULATION HEALTH DEPARTMENT Patient Outreach Telephone from 08/21/2022 in [place] POPULATION HEALTH DEPARTMENT Patient Outreach Telephone from 07/23/2022 in [place] POPULATION HEALTH DEPARTMENT Patient Outreach Telephone from 06/25/2022 in [place] POPULATION HEALTH DEPARTMENT Patient Outreach Telephone from 05/29/2022 in [place] POPULATION HEALTH DEPARTMENT  SDOH Interventions        Food Insecurity Interventions -- Intervention Not Indicated -- -- -- --  Housing Interventions -- Intervention Not Indicated -- -- -- --  Transportation Interventions -- -- -- -- -- Intervention Not Indicated  Utilities Interventions -- -- -- Intervention Not Indicated -- --   Alcohol Usage Interventions -- -- -- -- Intervention Not Indicated (Score <7) --  Financial Strain Interventions -- -- -- Other (Comment)  [patient has been given resources, DPR] -- --  Physical Activity Interventions -- -- Other (Comments)  [patient deconditioned, balance issues per DPR, patient's wife to f/u with provider] -- -- --  Stress Interventions -- -- -- -- Intervention Not Indicated --  Health Literacy Interventions Intervention Not Indicated -- -- -- -- --     Care Plan  Allergies  Allergen Reactions   Penicillins Hives    Did it involve swelling of the face/tongue/throat, SOB, or low BP? Y Did it involve sudden or severe rash/hives, skin peeling, or any reaction on the inside of your mouth or nose? N Did you need to seek medical attention at a hospital or doctor's office? Y When did it last happen?  Over 5 Years Ago     If all above answers are "NO", may proceed with cephalosporin use.    Medications Reviewed Today     Reviewed by Danie Chandler, RN (Registered Nurse) on 12/07/22 at 0935  Med List Status: <None>   Medication Order Taking? Sig Documenting Provider Last Dose Status Informant  amLODipine (NORVASC) 10 MG tablet [id number]  Take 1 tablet (10 mg total) by mouth daily. Elder [name], MD  Active   aspirin EC 81 MG tablet [id number] No Take 1 tablet (81 mg total) by mouth daily. Swallow whole. [name], [name], PA-C Taking Active   aspirin EC 81 MG tablet [id number] No Take 1 tablet (81 mg total) by mouth daily. Swallow whole. Patwardhan, Anabel Bene, MD Taking Active   atorvastatin (LIPITOR) 40 MG tablet [id number]  Take 1 tablet (40 mg total) by mouth daily.  Elder [name], MD  Active   Blood Pressure Monitor KIT [id number] No Use to check blood pressure dailly. [name], MD Taking Active   cholecalciferol (VITAMIN D) 25 MCG (1000 UNIT) tablet [id number] No Take by mouth.  Taking Active   empagliflozin (JARDIANCE) 10 MG TABS tablet [id number]  Take 1  tablet (10 mg total) by mouth daily before breakfast. Patwardhan, Anabel Bene, MD  Active   hydrALAZINE (APRESOLINE) 50 MG tablet [id number]  Take 1 tablet (50 mg total) by mouth every 8 (eight) hours. Patwardhan, Anabel Bene, MD  Active   isosorbide dinitrate (ISORDIL) 30 MG tablet [id number]  Take 1 tablet (30 mg total) by mouth 3 (three) times daily. Patwardhan, Anabel Bene, MD  Active   lacosamide (VIMPAT) 200 MG TABS tablet [id number]  TAKE 1 TABLET (200 MG TOTAL) BY MOUTH 2 (TWO) TIMES DAILY.(AM+PM) Penumalli, Glenford Bayley, MD  Active   levETIRAcetam (KEPPRA) 750 MG tablet [id number] No Take 2 tablets (1,500 mg total) by mouth 2 (two) times daily. Suanne Marker, MD Taking Active   metFORMIN (GLUCOPHAGE) 500 MG tablet [id number] No TAKE 2 TABLETS (1000 MG) BY MOUTH TWICE A DAY WITH A MEAL (2AM+2PM) Arthur Slater, DO Taking Active   metoprolol succinate (TOPROL-XL) 100 MG 24 hr tablet [id number]  Take 0.5 tablets (50 mg total) by mouth daily. Take with or immediately following a meal. Patwardhan, Manish J, MD  Active   sacubitril-valsartan (ENTRESTO) 49-51 MG [id number]  Take 1 tablet by mouth 2 (two) times daily. Start on 06/29/2021 Elder [name], MD  Active   Med List Note [name], CPhT 02/24/21 1756): [place]: "12/08/20 receiving PAP through [employer], approved until 12/08/2022"           Patient Active Problem List   Diagnosis Date Noted   Healthcare maintenance 07/11/2022   Tobacco use disorder 07/11/2022   Pain due to onychomycosis of toenails of both feet 07/05/2022   Porokeratosis 07/05/2022   Vitamin D deficiency 09/07/2021   Cognitive impairment 09/06/2021   Coronary artery disease    HFrEF (heart failure with reduced ejection fraction) (HCC) 05/10/2021   Seizures (HCC) 02/24/2021   Aortic atherosclerosis (HCC) 11/22/2020   History of seizures 11/22/2020   Alcohol use disorder, severe, dependence (HCC) 11/22/2020   Controlled type 2 diabetes mellitus with microalbuminuria, without  long-term current use of insulin (HCC) 11/22/2020   Anemia 11/22/2020   Hyperlipidemia 11/22/2020   Essential hypertension 10/08/2019   History of CVA (cerebrovascular accident) 10/02/2019   Conditions to be addressed/monitored per PCP order:  Chronic healthcare management needs, h/o ETOH abuse, h/o seizures, h/o CVA, HTN, DM, HLD, CAD, tobacco use, cognitive impairment  Care Plan : RN Care Manager Plan of Care  Updates made by Danie Chandler, RN since 12/07/2022 12:00 AM     Problem: Chronic Disease Management and Care Coordination Needs for DMII, HTN, CAD, Seizures   Priority: High     Long-Range Goal: Development of Plan of Care for Chronic Disease Management and Care Coordination Needs (CAD, HTN, DMII, Seizures)   Start Date: 08/08/2021  Expected End Date: 01/08/2023  Priority: High  Note:   Current Barriers:  Knowledge Deficits related to plan of care for management of CAD, HTN, DMII, and Seizures, tobacco use, HLD, h/o CVA Care Coordination needs related to Financial constraints related to affordable safe housing and utility payment, Housing barriers, Medication procurement, ADL IADL limitations, Literacy concerns, Cognitive Deficits, Memory Deficits, Inability to perform IADL's independently, and Lacks knowledge  of community resource: safe & affordable Section 8 Housing, utility payment assistance and dental care providers covered by [employer]  Chronic Disease Management support and education needs related to CAD, HTN, DMII, and Seizures, tobacco use, HLD, h/o CVA Corporate treasurer.  Non-adherence to prescribed medication regimen Difficulty obtaining medications Cognitive Deficits No Advanced Directives in place Falls - Increased Potential for Falls 12/07/22:  Seen in ED 6/15 for syncopal episode-no further problems per patient's wife.  Taking all meds.  No available BP or BG readings.  Smokes 3 cigars a day and wife states patient is drinking beer again-not  sure of amount.  RNCM Clinical Goal(s):  Patient/ DPR  will verbalize understanding of plan for management of CAD, HTN, DMII, and Seizures as evidenced by improved management of these chronic diseases. verbalize basic understanding of CAD, HTN, DMII, and Seizures disease process and self health management plan as evidenced by noted improvement of management of these chronic diseases. take all medications exactly as prescribed and will call provider for medication related questions as evidenced by being compliant with all medications    attend all scheduled medical appointments     demonstrate improved adherence to prescribed treatment plan for CAD, HTN, DMII, and Seizures as evidenced by overall improved management of these chronic diseases continue to work with Medical illustrator and/or Social Worker to address care management and care coordination needs related to CAD, HTN, DMII, and Seizures as evidenced by adherence to CM Team Scheduled appointments     work with pharmacist to address Financial constraints related to obtaining medications and Medication procurement related to CAD, HTN, DMII, and Seizures as evidenced by review of EMR and patient or pharmacist report    work with Child psychotherapist to address Financial constraints related to safe affordable Housing and utility payment , Housing barriers, ADL IADL limitations, Cognitive Deficits, Memory Deficits, Inability to perform IADL's independently, and Lacks knowledge of community resource: safe and affordable Section 8 Housing, utility payment assistance and dental care providers covered by [employer] insurance related to the management of CAD, HTN, DMII, and Seizures as evidenced by review of EMR and patient or Child psychotherapist report through collaboration with Medical illustrator, provider, and care team.   Interventions: Inter-disciplinary care team collaboration (see longitudinal plan of care) Evaluation of current treatment plan related to  self  management and patient's adherence to plan as established by provider BSW referral for Podiatry resources-completed Collaborated with BSW. Pharmacy referral for medication assistance-completed Collaborated with Pharmacy  CAD  (Status: Goal on Track (progressing): YES.) Long Term Goal  Assessed understanding of CAD diagnosis Medications reviewed including medications utilized in CAD treatment plan Provided education on importance of blood pressure control in management of CAD; Counseled on the importance of exercise goals with target of 150 minutes per week Reviewed Importance of taking all medications as prescribed Reviewed Importance of attending all scheduled provider appointments Assessed social determinant of health barriers;  Diabetes:  (Status: Goal on Track (progressing): YES.) Long Term Goal  Lab Results  Component Value Date   HGBA1C 7.1 (H) 09/05/2021   HGBA1C=5.9 on 12/21/21  HGBA1C=5.6 on 07/23/22  Assessed patient's / DPR's understanding of A1c goal: <7% Provided education to patient / DPR about basic DM disease process; Reviewed medications with patient and discussed importance of medication adherence;        Discussed plans with patient / DPR for ongoing care management follow up and provided patient with direct contact information for care management team;  Reviewed scheduled/upcoming provider appointments        Referral made to pharmacy team for assistance with complex medication regimen; [facility] Pharmacist involved in care;       Referral made to social work team for assistance with housing and utility payment assistance, dental care providers covered by Managed Medicaid; [employer] BSW involved in care;      Assessed social determinant of health barriers;        [name] denies any episodes or any signs/symptoms of Hypoglycemia or Hyperglycemia.   Seizures  (Status: Goal on Track (progressing): YES.) Long Term Goal  Evaluation of current treatment plan related to   Seizures ,  and any seizure activity,  self-management and patient's / DPR's adherence to plan as established by provider. Discussed plans with patient / DPR for ongoing care management follow up and provided patient / DPR with direct contact information for care management team Advised patient / DPR to report any seizure activity to PCP; Reviewed medications with patient and discussed importance of medication compliance; Reviewed scheduled/upcoming provider appointments  Social Work referral for Housing, utility payment and dental care providers-completed Pharmacy referral for complex medication regimen; Assessed social determinant of health barriers;  [name] reports no recent seizures.  Patient currently has all medications per patient's wife.  Hypertension: (Status: Goal on Track (progressing): YES.) Long Term Goal  Last practice recorded BP readings:  BP Readings from Last 3 Encounters:  09/05/21 128/78  08/28/21 132/78  07/11/21 128/74     02/15/22-102/73    07/10/22- 177/101    11/10/22-126/75  Most recent eGFR/CrCl:  Lab Results  Component Value Date   EGFR 92 06/06/2021    No components found for: CRCL  Evaluation of current treatment plan related to hypertension self management and patient's / DPR's adherence to plan as established by provider;   Reviewed prescribed diet Low Salt, Heart Healthy Reviewed medications with patient / DPR and discussed importance of compliance  Provided assistance with obtaining home blood pressure monitor via contacting  [facility].  [facility] staff confirmed that blood pressure monitor is ready.  I requested they deliver the monitor to patient's new home.  [facility] will deliver the monitor.;  Counseled on the importance of exercise goals with target of 150 minutes per week Discussed plans with patient for ongoing care management follow up and provided patient / DPR with direct contact information for care management  team; Reviewed scheduled/upcoming provider appointments Assessed social determinant of health barriers;   Patient Goals/Self-Care Activities: Take medications as prescribed   Attend all scheduled provider appointments Call pharmacy for medication refills 3-7 days in advance of running out of medications Call provider office for new concerns or questions  Work with the social worker to address care coordination needs and will continue to work with the clinical team to address health care and disease management related needs [name] to contact PCP for shower chair and PT. [name] to schedule Podiatry appointment    Long-Range Goal: Establish Plan of Care for Chronic Disease Management Needs   Priority: High  Note:   Timeframe:  Long-Range Goal Priority:  High Start Date:    12/05/21                         Expected End Date:  ongoing                     Follow Up Date 01/07/23   -  schedule appointment for vaccines needed due to my age or health - schedule recommended health tests - schedule and keep appointment for annual check-up    Why is this important?   Screening tests can find diseases early when they are easier to treat.  Your doctor or nurse will talk with you about which tests are important for you.  Getting shots for common diseases like the flu and shingles will help prevent them.   12/07/22:  No current appts   Follow Up:  Patient agrees to Care Plan and Follow-up.  Plan: The Managed Medicaid care management team will reach out to the patient again over the next 30 business  days. and The  Patient has been provided with contact information for the Managed Medicaid care management team and has been advised to call with any health related questions or concerns.  Date/time of next scheduled RN care management/care coordination outreach:  01/07/23 at 0900

## 2022-12-07 NOTE — Patient Instructions (Signed)
Visit Information  Arthur [name] / Arthur [name] was given information about Medicaid Managed Care team care coordination services as a part of their [facility] Community Plan Medicaid benefit. Arthur [name] / Arthur [name] verbally consented to engagement with the [facility] Managed Care team.   If you are experiencing a medical emergency, please call 911 or report to your local emergency department or urgent care.   If you have a non-emergency medical problem during routine business hours, please contact your provider's office and ask to speak with a nurse.   For questions related to your [facility], please call: [phone] or visit the homepage here: [email or web address]  If you would like to schedule transportation through your [facility], please call the following number at least 2 days in advance of your appointment: [phone]   Rides for urgent appointments can also be made after hours by calling Member Services.  Call the Behavioral Health Crisis Line at [phone], at any time, 24 hours a day, 7 days a week. If you are in danger or need immediate medical attention call 911.  If you would like help to quit smoking, call 1-800-QUIT-NOW (256 659 6668) OR Espaol: 1-855-Djelo-Ya (1-324-401-0272) o para ms informacin haga clic aqu or Text READY to 536-644 to register via text  Arthur [name] / Arthur [name] - following are the goals we discussed in your visit today:   Goals Addressed    Timeframe:  Long-Range Goal Priority:  High Start Date:    12/05/21                         Expected End Date:  ongoing                     Follow Up Date 01/07/23   - schedule appointment for vaccines needed due to my age or health - schedule recommended health tests - schedule and keep appointment for annual check-up    Why is this important?   Screening tests can find diseases early when  they are easier to treat.  Your doctor or nurse will talk with you about which tests are important for you.  Getting shots for common diseases like the flu and shingles will help prevent them.   12/07/22:  No current appts  Patient / DPR verbalizes understanding of instructions and care plan provided today and agrees to view in MyChart. Active MyChart status and patient / DPR understanding of how to access instructions and care plan via MyChart confirmed with patient/DPR.    The Managed Medicaid care management team will reach out to the patient again over the next 30 business  days.  The  [name] has been provided with contact information for the Managed Medicaid care management team and has been advised to call with any health related questions or concerns.   Arthur Der RN, BSN [place]  [facility] Care Management Coordinator - Managed Medicaid High Risk [phone]   Following is a copy of your plan of care:  Care Plan : RN Care Manager Plan of Care  Updates made by Danie Chandler, RN since 12/07/2022 12:00 AM     Problem: Chronic Disease Management and Care Coordination Needs for DMII, HTN, CAD, Seizures   Priority: High     Long-Range Goal: Development of Plan of Care for Chronic Disease Management and Care Coordination Needs (CAD, HTN, DMII, Seizures)   Start Date:  08/08/2021  Expected End Date: 01/08/2023  Priority: High  Note:   Current Barriers:  Knowledge Deficits related to plan of care for management of CAD, HTN, DMII, and Seizures, tobacco use, HLD, h/o CVA Care Coordination needs related to Financial constraints related to affordable safe housing and utility payment, Housing barriers, Medication procurement, ADL IADL limitations, Literacy concerns, Cognitive Deficits, Memory Deficits, Inability to perform IADL's independently, and Lacks knowledge of community resource: safe & affordable Section 8 Housing, utility payment assistance  and dental care providers covered by [employer]  Chronic Disease Management support and education needs related to CAD, HTN, DMII, and Seizures, tobacco use, HLD, h/o CVA Corporate treasurer.  Non-adherence to prescribed medication regimen Difficulty obtaining medications Cognitive Deficits No Advanced Directives in place Falls - Increased Potential for Falls 12/07/22:  Seen in ED 6/15 for syncopal episode-no further problems per patient's wife.  Taking all meds.  No available BP or BG readings.  Smokes 3 cigars a day and wife states patient is drinking beer again-not sure of amount.  RNCM Clinical Goal(s):  Patient/ DPR  will verbalize understanding of plan for management of CAD, HTN, DMII, and Seizures as evidenced by improved management of these chronic diseases. verbalize basic understanding of CAD, HTN, DMII, and Seizures disease process and self health management plan as evidenced by noted improvement of management of these chronic diseases. take all medications exactly as prescribed and will call provider for medication related questions as evidenced by being compliant with all medications    attend all scheduled medical appointments     demonstrate improved adherence to prescribed treatment plan for CAD, HTN, DMII, and Seizures as evidenced by overall improved management of these chronic diseases continue to work with Medical illustrator and/or Social Worker to address care management and care coordination needs related to CAD, HTN, DMII, and Seizures as evidenced by adherence to CM Team Scheduled appointments     work with pharmacist to address Financial constraints related to obtaining medications and Medication procurement related to CAD, HTN, DMII, and Seizures as evidenced by review of EMR and patient or pharmacist report    work with Child psychotherapist to address Financial constraints related to safe affordable Housing and utility payment , Housing barriers, ADL IADL  limitations, Cognitive Deficits, Memory Deficits, Inability to perform IADL's independently, and Lacks knowledge of community resource: safe and affordable Section 8 Housing, utility payment assistance and dental care providers covered by [employer] insurance related to the management of CAD, HTN, DMII, and Seizures as evidenced by review of EMR and patient or Child psychotherapist report through collaboration with Medical illustrator, provider, and care team.   Interventions: Inter-disciplinary care team collaboration (see longitudinal plan of care) Evaluation of current treatment plan related to  self management and patient's adherence to plan as established by provider BSW referral for Podiatry resources-completed Collaborated with BSW. Pharmacy referral for medication assistance-completed Collaborated with Pharmacy  CAD  (Status: Goal on Track (progressing): YES.) Long Term Goal  Assessed understanding of CAD diagnosis Medications reviewed including medications utilized in CAD treatment plan Provided education on importance of blood pressure control in management of CAD; Counseled on the importance of exercise goals with target of 150 minutes per week Reviewed Importance of taking all medications as prescribed Reviewed Importance of attending all scheduled provider appointments Assessed social determinant of health barriers;  Diabetes:  (Status: Goal on Track (progressing): YES.) Long Term Goal  Lab Results  Component Value Date   HGBA1C 7.1 (  H) 09/05/2021   HGBA1C=5.9 on 12/21/21  HGBA1C=5.6 on 07/23/22  Assessed patient's / DPR's understanding of A1c goal: <7% Provided education to patient / DPR about basic DM disease process; Reviewed medications with patient and discussed importance of medication adherence;        Discussed plans with patient / DPR for ongoing care management follow up and provided patient with direct contact information for care management team;      Reviewed  scheduled/upcoming provider appointments        Referral made to pharmacy team for assistance with complex medication regimen; [facility] Pharmacist involved in care;       Referral made to social work team for assistance with housing and utility payment assistance, dental care providers covered by Managed Medicaid; [employer] BSW involved in care;      Assessed social determinant of health barriers;        [name] denies any episodes or any signs/symptoms of Hypoglycemia or Hyperglycemia.   Seizures  (Status: Goal on Track (progressing): YES.) Long Term Goal  Evaluation of current treatment plan related to  Seizures ,  and any seizure activity,  self-management and patient's / DPR's adherence to plan as established by provider. Discussed plans with patient / DPR for ongoing care management follow up and provided patient / DPR with direct contact information for care management team Advised patient / DPR to report any seizure activity to PCP; Reviewed medications with patient and discussed importance of medication compliance; Reviewed scheduled/upcoming provider appointments  Social Work referral for Housing, utility payment and dental care providers-completed Pharmacy referral for complex medication regimen; Assessed social determinant of health barriers;  [name] reports no recent seizures.  Patient currently has all medications per patient's wife.  Hypertension: (Status: Goal on Track (progressing): YES.) Long Term Goal  Last practice recorded BP readings:  BP Readings from Last 3 Encounters:  09/05/21 128/78  08/28/21 132/78  07/11/21 128/74     02/15/22-102/73    07/10/22- 177/101    11/10/22-126/75  Most recent eGFR/CrCl:  Lab Results  Component Value Date   EGFR 92 06/06/2021    No components found for: CRCL  Evaluation of current treatment plan related to hypertension self management and patient's / DPR's adherence to plan as established by provider;   Reviewed prescribed  diet Low Salt, Heart Healthy Reviewed medications with patient / DPR and discussed importance of compliance  Provided assistance with obtaining home blood pressure monitor via contacting  [facility].  [facility] staff confirmed that blood pressure monitor is ready.  I requested they deliver the monitor to patient's new home.  [facility] will deliver the monitor.;  Counseled on the importance of exercise goals with target of 150 minutes per week Discussed plans with patient for ongoing care management follow up and provided patient / DPR with direct contact information for care management team; Reviewed scheduled/upcoming provider appointments Assessed social determinant of health barriers;   Patient Goals/Self-Care Activities: Take medications as prescribed   Attend all scheduled provider appointments Call pharmacy for medication refills 3-7 days in advance of running out of medications Call provider office for new concerns or questions  Work with the social worker to address care coordination needs and will continue to work with the clinical team to address health care and disease management related needs [name] to contact PCP for shower chair and PT. [name] to schedule Podiatry appointment

## 2022-12-11 DIAGNOSIS — R4189 Other symptoms and signs involving cognitive functions and awareness: Secondary | ICD-10-CM | POA: Diagnosis not present

## 2022-12-12 DIAGNOSIS — R4189 Other symptoms and signs involving cognitive functions and awareness: Secondary | ICD-10-CM | POA: Diagnosis not present

## 2022-12-13 DIAGNOSIS — R4189 Other symptoms and signs involving cognitive functions and awareness: Secondary | ICD-10-CM | POA: Diagnosis not present

## 2022-12-14 DIAGNOSIS — R4189 Other symptoms and signs involving cognitive functions and awareness: Secondary | ICD-10-CM | POA: Diagnosis not present

## 2022-12-15 DIAGNOSIS — R4189 Other symptoms and signs involving cognitive functions and awareness: Secondary | ICD-10-CM | POA: Diagnosis not present

## 2022-12-16 DIAGNOSIS — R4189 Other symptoms and signs involving cognitive functions and awareness: Secondary | ICD-10-CM | POA: Diagnosis not present

## 2022-12-17 DIAGNOSIS — R4189 Other symptoms and signs involving cognitive functions and awareness: Secondary | ICD-10-CM | POA: Diagnosis not present

## 2022-12-18 DIAGNOSIS — R4189 Other symptoms and signs involving cognitive functions and awareness: Secondary | ICD-10-CM | POA: Diagnosis not present

## 2022-12-19 DIAGNOSIS — R4189 Other symptoms and signs involving cognitive functions and awareness: Secondary | ICD-10-CM | POA: Diagnosis not present

## 2022-12-20 DIAGNOSIS — R4189 Other symptoms and signs involving cognitive functions and awareness: Secondary | ICD-10-CM | POA: Diagnosis not present

## 2022-12-21 DIAGNOSIS — R4189 Other symptoms and signs involving cognitive functions and awareness: Secondary | ICD-10-CM | POA: Diagnosis not present

## 2022-12-22 DIAGNOSIS — R4189 Other symptoms and signs involving cognitive functions and awareness: Secondary | ICD-10-CM | POA: Diagnosis not present

## 2022-12-23 DIAGNOSIS — R4189 Other symptoms and signs involving cognitive functions and awareness: Secondary | ICD-10-CM | POA: Diagnosis not present

## 2022-12-24 DIAGNOSIS — R4189 Other symptoms and signs involving cognitive functions and awareness: Secondary | ICD-10-CM | POA: Diagnosis not present

## 2022-12-25 DIAGNOSIS — R4189 Other symptoms and signs involving cognitive functions and awareness: Secondary | ICD-10-CM | POA: Diagnosis not present

## 2022-12-26 DIAGNOSIS — R4189 Other symptoms and signs involving cognitive functions and awareness: Secondary | ICD-10-CM | POA: Diagnosis not present

## 2022-12-27 DIAGNOSIS — R4189 Other symptoms and signs involving cognitive functions and awareness: Secondary | ICD-10-CM | POA: Diagnosis not present

## 2023-01-01 DIAGNOSIS — R4189 Other symptoms and signs involving cognitive functions and awareness: Secondary | ICD-10-CM | POA: Diagnosis not present

## 2023-01-02 DIAGNOSIS — R4189 Other symptoms and signs involving cognitive functions and awareness: Secondary | ICD-10-CM | POA: Diagnosis not present

## 2023-01-03 DIAGNOSIS — R4189 Other symptoms and signs involving cognitive functions and awareness: Secondary | ICD-10-CM | POA: Diagnosis not present

## 2023-01-07 ENCOUNTER — Encounter: Payer: Self-pay | Admitting: Obstetrics and Gynecology

## 2023-01-07 ENCOUNTER — Other Ambulatory Visit: Payer: Medicaid Other | Admitting: Obstetrics and Gynecology

## 2023-01-07 DIAGNOSIS — R4189 Other symptoms and signs involving cognitive functions and awareness: Secondary | ICD-10-CM | POA: Diagnosis not present

## 2023-01-07 NOTE — Patient Instructions (Signed)
Visit Information  Arthur [name] DPR  was given information about Medicaid Managed Care team care coordination services as a part of their [facility] Community Plan Medicaid benefit. Arthur [name] / DPR verbally consented to engagement with the [facility] Managed Care team.   If you are experiencing a medical emergency, please call 911 or report to your local emergency department or urgent care.   If you have a non-emergency medical problem during routine business hours, please contact your provider's office and ask to speak with a nurse.   For questions related to your [facility], please call: [phone] or visit the homepage here: [email or web address]  If you would like to schedule transportation through your [facility], please call the following number at least 2 days in advance of your appointment: [phone]   Rides for urgent appointments can also be made after hours by calling Member Services.  Call the Behavioral Health Crisis Line at [phone], at any time, 24 hours a day, 7 days a week. If you are in danger or need immediate medical attention call 911.  If you would like help to quit smoking, call 1-800-QUIT-NOW ((619) 577-8965) OR Espaol: 1-855-Djelo-Ya (3-016-010-9323) o para ms informacin haga clic aqu or Text READY to 557-322 to register via text  Arthur [name] DPR  - following are the goals we discussed in your visit today:   Goals Addressed    Timeframe:  Long-Range Goal Priority:  High Start Date:    12/05/21                         Expected End Date:  ongoing                     Follow Up Date 03/08/23   - schedule appointment for vaccines needed due to my age or health - schedule recommended health tests - schedule and keep appointment for annual check-up    Why is this important?   Screening tests can find diseases early when they are easier to treat.   Your doctor or nurse will talk with you about which tests are important for you.  Getting shots for common diseases like the flu and shingles will help prevent them.   01/07/23:  Per DPR, has upcoming PCP and CARDS appt  Patient verbalizes understanding of instructions and care plan provided today and agrees to view in MyChart. Active MyChart status and patient understanding of how to access instructions and care plan via MyChart confirmed with patient.     The Managed Medicaid care management team will reach out to the patient again over the next 60 business  days.  The  [name] has been provided with contact information for the Managed Medicaid care management team and has been advised to call with any health related questions or concerns.   Arthur Der RN, BSN [place]  [facility] Care Management Coordinator - Managed Medicaid High Risk [phone]   Following is a copy of your plan of care:  Care Plan : RN Care Manager Plan of Care  Updates made by Arthur Chandler, RN since 01/07/2023 12:00 AM     Problem: Chronic Disease Management and Care Coordination Needs for DMII, HTN, CAD, Seizures   Priority: High     Long-Range Goal: Development of Plan of Care for Chronic Disease Management and Care Coordination Needs (CAD, HTN, DMII, Seizures)   Start Date: 08/08/2021  Expected End Date: 04/09/2023  Priority: High  Note:   Current Barriers:  Knowledge Deficits related to plan of care for management of CAD, HTN, DMII, and Seizures, tobacco use, HLD, h/o CVA Care Coordination needs related to Financial constraints related to affordable safe housing and utility payment, Housing barriers, Medication procurement, ADL IADL limitations, Literacy concerns, Cognitive Deficits, Memory Deficits, Inability to perform IADL's independently, and Lacks knowledge of community resource: safe & affordable Section 8 Housing, utility payment assistance and dental care  providers covered by [employer]  Chronic Disease Management support and education needs related to CAD, HTN, DMII, and Seizures, tobacco use, HLD, h/o CVA Corporate treasurer.  Non-adherence to prescribed medication regimen Difficulty obtaining medications Cognitive Deficits No Advanced Directives in place Falls - Increased Potential for Falls 01/07/23:  No reported syncopal episodes per DPR-has upcoming PCP and CARDS appt per DPR.  No available BP or BG results.  Smokes 3 cigars daily.  No reported seizure activity.    RNCM Clinical Goal(s):  Patient/ DPR  will verbalize understanding of plan for management of CAD, HTN, DMII, and Seizures as evidenced by improved management of these chronic diseases. verbalize basic understanding of CAD, HTN, DMII, and Seizures disease process and self health management plan as evidenced by noted improvement of management of these chronic diseases. take all medications exactly as prescribed and will call provider for medication related questions as evidenced by being compliant with all medications    attend all scheduled medical appointments     demonstrate improved adherence to prescribed treatment plan for CAD, HTN, DMII, and Seizures as evidenced by overall improved management of these chronic diseases continue to work with Medical illustrator and/or Social Worker to address care management and care coordination needs related to CAD, HTN, DMII, and Seizures as evidenced by adherence to CM Team Scheduled appointments     work with pharmacist to address Financial constraints related to obtaining medications and Medication procurement related to CAD, HTN, DMII, and Seizures as evidenced by review of EMR and patient or pharmacist report    work with Child psychotherapist to address Financial constraints related to safe affordable Housing and utility payment , Housing barriers, ADL IADL limitations, Cognitive Deficits, Memory Deficits, Inability to perform  IADL's independently, and Lacks knowledge of community resource: safe and affordable Section 8 Housing, utility payment assistance and dental care providers covered by [employer] insurance related to the management of CAD, HTN, DMII, and Seizures as evidenced by review of EMR and patient or Child psychotherapist report through collaboration with Medical illustrator, provider, and care team.   Interventions: Inter-disciplinary care team collaboration (see longitudinal plan of care) Evaluation of current treatment plan related to  self management and patient's adherence to plan as established by provider BSW referral for Podiatry resources-completed Collaborated with BSW. Pharmacy referral for medication assistance-completed Collaborated with Pharmacy  CAD  (Status: Goal on Track (progressing): YES.) Long Term Goal  Assessed understanding of CAD diagnosis Medications reviewed including medications utilized in CAD treatment plan Provided education on importance of blood pressure control in management of CAD; Counseled on the importance of exercise goals with target of 150 minutes per week Reviewed Importance of taking all medications as prescribed Reviewed Importance of attending all scheduled provider appointments Assessed social determinant of health barriers;  Diabetes:  (Status: Goal on Track (progressing): YES.) Long Term Goal  Lab Results  Component Value Date   HGBA1C 7.1 (H) 09/05/2021   HGBA1C=5.9 on 12/21/21  HGBA1C=5.6 on  07/23/22  Assessed patient's / DPR's understanding of A1c goal: <7% Provided education to patient / DPR about basic DM disease process; Reviewed medications with patient and discussed importance of medication adherence;        Discussed plans with patient / DPR for ongoing care management follow up and provided patient with direct contact information for care management team;      Reviewed scheduled/upcoming provider appointments        Referral made to pharmacy team  for assistance with complex medication regimen; [facility] Pharmacist involved in care;       Referral made to social work team for assistance with housing and utility payment assistance, dental care providers covered by Managed Medicaid; [employer] BSW involved in care;      Assessed social determinant of health barriers;        [name] denies any episodes or any signs/symptoms of Hypoglycemia or Hyperglycemia.   Seizures  (Status: Goal on Track (progressing): YES.) Long Term Goal  Evaluation of current treatment plan related to  Seizures ,  and any seizure activity,  self-management and patient's / DPR's adherence to plan as established by provider. Discussed plans with patient / DPR for ongoing care management follow up and provided patient / DPR with direct contact information for care management team Advised patient / DPR to report any seizure activity to PCP; Reviewed medications with patient and discussed importance of medication compliance; Reviewed scheduled/upcoming provider appointments  Social Work referral for Housing, utility payment and dental care providers-completed Pharmacy referral for complex medication regimen; Assessed social determinant of health barriers;  [name] reports no recent seizures.  Patient currently has all medications per patient's wife.  Hypertension: (Status: Goal on Track (progressing): YES.) Long Term Goal  Last practice recorded BP readings:  BP Readings from Last 3 Encounters:              02/15/22-102/73    07/10/22- 177/101    11/10/22-126/75  Most recent eGFR/CrCl:  Lab Results  Component Value Date   EGFR 92 06/06/2021    No components found for: CRCL  Evaluation of current treatment plan related to hypertension self management and patient's / DPR's adherence to plan as established by provider;   Reviewed prescribed diet Low Salt, Heart Healthy Reviewed medications with patient / DPR and discussed importance of compliance  Provided  assistance with obtaining home blood pressure monitor via contacting  [facility].  [facility] staff confirmed that blood pressure monitor is ready.  I requested they deliver the monitor to patient's new home.  [facility] will deliver the monitor.;  Counseled on the importance of exercise goals with target of 150 minutes per week Discussed plans with patient for ongoing care management follow up and provided patient / DPR with direct contact information for care management team; Reviewed scheduled/upcoming provider appointments Assessed social determinant of health barriers;   Patient Goals/Self-Care Activities: Take medications as prescribed   Attend all scheduled provider appointments Call pharmacy for medication refills 3-7 days in advance of running out of medications Call provider office for new concerns or questions  Work with the social worker to address care coordination needs and will continue to work with the clinical team to address health care and disease management related needs [name] to contact PCP for shower chair and PT. [name] to schedule Podiatry appointment

## 2023-01-07 NOTE — Patient Outreach (Signed)
Medicaid Managed Care   Nurse Care Manager Note  01/07/2023 Name:  Arthur [name] MRN:  [id number] DOB:  [birth year]  Arthur [name] is an 62 y.o. year old male who is a primary patient of Modena Slater, DO.  The [facility] Managed Care Coordination team was consulted for assistance with:    Chronic healthcare management needs, h/o ETOH abuse, h/o seizures, h/o CVA, HTN, DM, HLD, CAD  Arthur [name] was given information about Medicaid Managed Care Coordination team services today. Arthur [name] Patient agreed to services and verbal consent obtained.  Engaged with patient by telephone for follow up visit in response to provider referral for case management and/or care coordination services.   Assessments/Interventions:  Review of past medical history, allergies, medications, health status, including review of consultants reports, laboratory and other test data, was performed as part of comprehensive evaluation and provision of chronic care management services.  SDOH (Social Determinants of Health) assessments and interventions performed: SDOH Interventions    Flowsheet Row Patient Outreach Telephone from 01/07/2023 in [place] POPULATION HEALTH DEPARTMENT Patient Outreach Telephone from 12/07/2022 in [place] POPULATION HEALTH DEPARTMENT Patient Outreach Telephone from 10/08/2022 in [place] POPULATION HEALTH DEPARTMENT Patient Outreach Telephone from 08/21/2022 in [place] POPULATION HEALTH DEPARTMENT Patient Outreach Telephone from 07/23/2022 in [place] POPULATION HEALTH DEPARTMENT Patient Outreach Telephone from 06/25/2022 in [place] POPULATION HEALTH DEPARTMENT  SDOH Interventions        Food Insecurity Interventions -- -- Intervention Not Indicated -- -- --  Housing Interventions -- -- Intervention Not Indicated -- -- --  Transportation Interventions Intervention Not Indicated -- -- -- -- --  Utilities Interventions -- -- -- -- Intervention Not Indicated --  Alcohol Usage Interventions --  -- -- -- -- Intervention Not Indicated (Score <7)  Financial Strain Interventions -- -- -- -- Other (Comment)  [patient has been given resources, DPR] --  Physical Activity Interventions -- -- -- Other (Comments)  [patient deconditioned, balance issues per DPR, patient's wife to f/u with provider] -- --  Stress Interventions Intervention Not Indicated -- -- -- -- Intervention Not Indicated  Health Literacy Interventions -- Intervention Not Indicated -- -- -- --     Care Plan Allergies  Allergen Reactions   Penicillins Hives    Did it involve swelling of the face/tongue/throat, SOB, or low BP? Y Did it involve sudden or severe rash/hives, skin peeling, or any reaction on the inside of your mouth or nose? N Did you need to seek medical attention at a hospital or doctor's office? Y When did it last happen?  Over 5 Years Ago     If all above answers are "NO", may proceed with cephalosporin use.    Medications Reviewed Today     Reviewed by Arthur [name], Arthur [name] (Registered Nurse) on 01/07/23 at 0919  Med List Status: <None>   Medication Order Taking? Sig Documenting Provider Last Dose Status Informant  amLODipine (NORVASC) 10 MG tablet [id number]  Take 1 tablet (10 mg total) by mouth daily. Elder [name], MD  Active   aspirin EC 81 MG tablet [id number] No Take 1 tablet (81 mg total) by mouth daily. Swallow whole. [name], [name], PA-C Taking Active   aspirin EC 81 MG tablet [id number] No Take 1 tablet (81 mg total) by mouth daily. Swallow whole. Patwardhan, Anabel Bene, MD Taking Active   atorvastatin (LIPITOR) 40 MG tablet [id number]  Take 1 tablet (40 mg total) by mouth daily. Patwardhan, Anabel Bene, MD  Active   Blood Pressure Monitor KIT [id number] No Use to check blood pressure dailly. [name], MD Taking Active   cholecalciferol (VITAMIN D) 25 MCG (1000 UNIT) tablet [id number] No Take by mouth.  Taking Active   empagliflozin (JARDIANCE) 10 MG TABS tablet [id number]  Take 1 tablet (10  mg total) by mouth daily before breakfast. Patwardhan, Anabel Bene, MD  Active   hydrALAZINE (APRESOLINE) 50 MG tablet [id number]  Take 1 tablet (50 mg total) by mouth every 8 (eight) hours. Patwardhan, Anabel Bene, MD  Active   isosorbide dinitrate (ISORDIL) 30 MG tablet [id number]  Take 1 tablet (30 mg total) by mouth 3 (three) times daily. Patwardhan, Anabel Bene, MD  Active   lacosamide (VIMPAT) 200 MG TABS tablet [id number]  TAKE 1 TABLET (200 MG TOTAL) BY MOUTH 2 (TWO) TIMES DAILY.(AM+PM) Penumalli, Glenford Bayley, MD  Active   levETIRAcetam (KEPPRA) 750 MG tablet [id number] No Take 2 tablets (1,500 mg total) by mouth 2 (two) times daily. Suanne Marker, MD Taking Active   metFORMIN (GLUCOPHAGE) 500 MG tablet [id number] No TAKE 2 TABLETS (1000 MG) BY MOUTH TWICE A DAY WITH A MEAL (2AM+2PM) Modena Slater, DO Taking Active   metoprolol succinate (TOPROL-XL) 100 MG 24 hr tablet [id number]  Take 0.5 tablets (50 mg total) by mouth daily. Take with or immediately following a meal. Patwardhan, Manish J, MD  Active   sacubitril-valsartan (ENTRESTO) 49-51 MG [id number]  Take 1 tablet by mouth 2 (two) times daily. Start on 06/29/2021 Elder [name], MD  Active   Med List Note Arthur [name], Arthur 02/24/21 1756): Vimpat: "12/08/20 receiving PAP through [employer], approved until 12/08/2022"           Patient Active Problem List   Diagnosis Date Noted   Healthcare maintenance 07/11/2022   Tobacco use disorder 07/11/2022   Pain due to onychomycosis of toenails of both feet 07/05/2022   Porokeratosis 07/05/2022   Vitamin D deficiency 09/07/2021   Cognitive impairment 09/06/2021   Coronary artery disease    HFrEF (heart failure with reduced ejection fraction) (HCC) 05/10/2021   Seizures (HCC) 02/24/2021   Aortic atherosclerosis (HCC) 11/22/2020   History of seizures 11/22/2020   Alcohol use disorder, severe, dependence (HCC) 11/22/2020   Controlled type 2 diabetes mellitus with microalbuminuria, without long-term  current use of insulin (HCC) 11/22/2020   Anemia 11/22/2020   Hyperlipidemia 11/22/2020   Essential hypertension 10/08/2019   History of CVA (cerebrovascular accident) 10/02/2019   Conditions to be addressed/monitored per PCP order:  Chronic healthcare management needs, h/o ETOH abuse, h/o seizures, h/o CVA, HTN, DM, HLD, CAD  Care Plan : Arthur [name] Care Manager Plan of Care  Updates made by Arthur [name], Arthur [name] since 01/07/2023 12:00 AM     Problem: Chronic Disease Management and Care Coordination Needs for DMII, HTN, CAD, Seizures   Priority: High     Long-Range Goal: Development of Plan of Care for Chronic Disease Management and Care Coordination Needs (CAD, HTN, DMII, Seizures)   Start Date: 08/08/2021  Expected End Date: 04/09/2023  Priority: High  Note:   Current Barriers:  Knowledge Deficits related to plan of care for management of CAD, HTN, DMII, and Seizures, tobacco use, HLD, h/o CVA Care Coordination needs related to Financial constraints related to affordable safe housing and utility payment, Housing barriers, Medication procurement, ADL IADL limitations, Literacy concerns, Cognitive Deficits, Memory Deficits, Inability to perform IADL's independently, and Lacks knowledge of community resource: safe & affordable Section 8 Housing,  utility payment assistance and dental care providers covered by Managed Medicaid insurance.  Chronic Disease Management support and education needs related to CAD, HTN, DMII, and Seizures, tobacco use, HLD, h/o CVA Corporate treasurer.  Non-adherence to prescribed medication regimen Difficulty obtaining medications Cognitive Deficits No Advanced Directives in place Falls - Increased Potential for Falls 01/07/23:  No reported syncopal episodes per DPR-has upcoming PCP and CARDS appt per DPR.  No available BP or BG results.  Smokes 3 cigars daily.  No reported seizure activity.    RNCM Clinical Goal(s):  Patient/ DPR  will verbalize understanding of plan  for management of CAD, HTN, DMII, and Seizures as evidenced by improved management of these chronic diseases. verbalize basic understanding of CAD, HTN, DMII, and Seizures disease process and self health management plan as evidenced by noted improvement of management of these chronic diseases. take all medications exactly as prescribed and will call provider for medication related questions as evidenced by being compliant with all medications    attend all scheduled medical appointments     demonstrate improved adherence to prescribed treatment plan for CAD, HTN, DMII, and Seizures as evidenced by overall improved management of these chronic diseases continue to work with Medical illustrator and/or Social Worker to address care management and care coordination needs related to CAD, HTN, DMII, and Seizures as evidenced by adherence to CM Team Scheduled appointments     work with pharmacist to address Financial constraints related to obtaining medications and Medication procurement related to CAD, HTN, DMII, and Seizures as evidenced by review of EMR and patient or pharmacist report    work with Child psychotherapist to address Financial constraints related to safe affordable Housing and utility payment , Housing barriers, ADL IADL limitations, Cognitive Deficits, Memory Deficits, Inability to perform IADL's independently, and Lacks knowledge of community resource: safe and affordable Section 8 Housing, utility payment assistance and dental care providers covered by [employer] insurance related to the management of CAD, HTN, DMII, and Seizures as evidenced by review of EMR and patient or Child psychotherapist report through collaboration with Medical illustrator, provider, and care team.   Interventions: Inter-disciplinary care team collaboration (see longitudinal plan of care) Evaluation of current treatment plan related to  self management and patient's adherence to plan as established by provider BSW referral for  Podiatry resources-completed Collaborated with BSW. Pharmacy referral for medication assistance-completed Collaborated with Pharmacy  CAD  (Status: Goal on Track (progressing): YES.) Long Term Goal  Assessed understanding of CAD diagnosis Medications reviewed including medications utilized in CAD treatment plan Provided education on importance of blood pressure control in management of CAD; Counseled on the importance of exercise goals with target of 150 minutes per week Reviewed Importance of taking all medications as prescribed Reviewed Importance of attending all scheduled provider appointments Assessed social determinant of health barriers;  Diabetes:  (Status: Goal on Track (progressing): YES.) Long Term Goal  Lab Results  Component Value Date   HGBA1C 7.1 (H) 09/05/2021   HGBA1C=5.9 on 12/21/21  HGBA1C=5.6 on 07/23/22  Assessed patient's / DPR's understanding of A1c goal: <7% Provided education to patient / DPR about basic DM disease process; Reviewed medications with patient and discussed importance of medication adherence;        Discussed plans with patient / DPR for ongoing care management follow up and provided patient with direct contact information for care management team;      Reviewed scheduled/upcoming provider appointments        Referral  made to pharmacy team for assistance with complex medication regimen; [facility] Pharmacist involved in care;       Referral made to social work team for assistance with housing and utility payment assistance, dental care providers covered by Managed Medicaid; [facility] BSW involved in care;      Assessed social determinant of health barriers;        [name] denies any episodes or any signs/symptoms of Hypoglycemia or Hyperglycemia.   Seizures  (Status: Goal on Track (progressing): YES.) Long Term Goal  Evaluation of current treatment plan related to  Seizures ,  and any seizure activity,  self-management and patient's / DPR's adherence to  plan as established by provider. Discussed plans with patient / DPR for ongoing care management follow up and provided patient / DPR with direct contact information for care management team Advised patient / DPR to report any seizure activity to PCP; Reviewed medications with patient and discussed importance of medication compliance; Reviewed scheduled/upcoming provider appointments  Social Work referral for Housing, utility payment and dental care providers-completed Pharmacy referral for complex medication regimen; Assessed social determinant of health barriers;  [name] reports no recent seizures.  Patient currently has all medications per patient's wife.  Hypertension: (Status: Goal on Track (progressing): YES.) Long Term Goal  Last practice recorded BP readings:  BP Readings from Last 3 Encounters:              02/15/22-102/73    07/10/22- 177/101    11/10/22-126/75  Most recent eGFR/CrCl:  Lab Results  Component Value Date   EGFR 92 06/06/2021    No components found for: CRCL  Evaluation of current treatment plan related to hypertension self management and patient's / DPR's adherence to plan as established by provider;   Reviewed prescribed diet Low Salt, Heart Healthy Reviewed medications with patient / DPR and discussed importance of compliance  Provided assistance with obtaining home blood pressure monitor via contacting  [facility].  [facility] staff confirmed that blood pressure monitor is ready.  I requested they deliver the monitor to patient's new home.  [facility] will deliver the monitor.;  Counseled on the importance of exercise goals with target of 150 minutes per week Discussed plans with patient for ongoing care management follow up and provided patient / DPR with direct contact information for care management team; Reviewed scheduled/upcoming provider appointments Assessed social determinant of health barriers;   Patient Goals/Self-Care  Activities: Take medications as prescribed   Attend all scheduled provider appointments Call pharmacy for medication refills 3-7 days in advance of running out of medications Call provider office for new concerns or questions  Work with the social worker to address care coordination needs and will continue to work with the clinical team to address health care and disease management related needs [name] to contact PCP for shower chair and PT. [name] to schedule Podiatry appointment    Long-Range Goal: Establish Plan of Care for Chronic Disease Management Needs   Priority: High  Note:   Timeframe:  Long-Range Goal Priority:  High Start Date:    12/05/21                         Expected End Date:  ongoing                     Follow Up Date 03/08/23   - schedule appointment for vaccines needed due to my age or health -  schedule recommended health tests - schedule and keep appointment for annual check-up    Why is this important?   Screening tests can find diseases early when they are easier to treat.  Your doctor or nurse will talk with you about which tests are important for you.  Getting shots for common diseases like the flu and shingles will help prevent them.   01/07/23:  Per DPR, has upcoming PCP and CARDS appt   Follow Up:  Patient / DPR agrees to Care Plan and Follow-up.  Plan: The Managed Medicaid care management team will reach out to the patient / DPR again over the next 60 business  days. and The  [name] has been provided with contact information for the Managed Medicaid care management team and has been advised to call with any health related questions or concerns.  Date/time of next scheduled Arthur [name] care management/care coordination outreach:  03/08/23 at 0900

## 2023-01-08 DIAGNOSIS — R4189 Other symptoms and signs involving cognitive functions and awareness: Secondary | ICD-10-CM | POA: Diagnosis not present

## 2023-01-10 DIAGNOSIS — R4189 Other symptoms and signs involving cognitive functions and awareness: Secondary | ICD-10-CM | POA: Diagnosis not present

## 2023-01-11 DIAGNOSIS — R4189 Other symptoms and signs involving cognitive functions and awareness: Secondary | ICD-10-CM | POA: Diagnosis not present

## 2023-01-13 DIAGNOSIS — R4189 Other symptoms and signs involving cognitive functions and awareness: Secondary | ICD-10-CM | POA: Diagnosis not present

## 2023-01-14 DIAGNOSIS — R4189 Other symptoms and signs involving cognitive functions and awareness: Secondary | ICD-10-CM | POA: Diagnosis not present

## 2023-01-15 DIAGNOSIS — R4189 Other symptoms and signs involving cognitive functions and awareness: Secondary | ICD-10-CM | POA: Diagnosis not present

## 2023-01-16 DIAGNOSIS — R4189 Other symptoms and signs involving cognitive functions and awareness: Secondary | ICD-10-CM | POA: Diagnosis not present

## 2023-01-19 DIAGNOSIS — R4189 Other symptoms and signs involving cognitive functions and awareness: Secondary | ICD-10-CM | POA: Diagnosis not present

## 2023-01-20 DIAGNOSIS — R4189 Other symptoms and signs involving cognitive functions and awareness: Secondary | ICD-10-CM | POA: Diagnosis not present

## 2023-01-21 DIAGNOSIS — R4189 Other symptoms and signs involving cognitive functions and awareness: Secondary | ICD-10-CM | POA: Diagnosis not present

## 2023-01-22 DIAGNOSIS — R4189 Other symptoms and signs involving cognitive functions and awareness: Secondary | ICD-10-CM | POA: Diagnosis not present

## 2023-01-23 DIAGNOSIS — R4189 Other symptoms and signs involving cognitive functions and awareness: Secondary | ICD-10-CM | POA: Diagnosis not present

## 2023-01-24 DIAGNOSIS — R4189 Other symptoms and signs involving cognitive functions and awareness: Secondary | ICD-10-CM | POA: Diagnosis not present

## 2023-01-25 DIAGNOSIS — R4189 Other symptoms and signs involving cognitive functions and awareness: Secondary | ICD-10-CM | POA: Diagnosis not present

## 2023-01-27 DIAGNOSIS — R4189 Other symptoms and signs involving cognitive functions and awareness: Secondary | ICD-10-CM | POA: Diagnosis not present

## 2023-01-28 DIAGNOSIS — R4189 Other symptoms and signs involving cognitive functions and awareness: Secondary | ICD-10-CM | POA: Diagnosis not present

## 2023-01-30 DIAGNOSIS — R4189 Other symptoms and signs involving cognitive functions and awareness: Secondary | ICD-10-CM | POA: Diagnosis not present

## 2023-01-31 DIAGNOSIS — R4189 Other symptoms and signs involving cognitive functions and awareness: Secondary | ICD-10-CM | POA: Diagnosis not present

## 2023-02-01 DIAGNOSIS — R4189 Other symptoms and signs involving cognitive functions and awareness: Secondary | ICD-10-CM | POA: Diagnosis not present

## 2023-02-02 DIAGNOSIS — R4189 Other symptoms and signs involving cognitive functions and awareness: Secondary | ICD-10-CM | POA: Diagnosis not present

## 2023-02-03 DIAGNOSIS — R4189 Other symptoms and signs involving cognitive functions and awareness: Secondary | ICD-10-CM | POA: Diagnosis not present

## 2023-02-04 DIAGNOSIS — R4189 Other symptoms and signs involving cognitive functions and awareness: Secondary | ICD-10-CM | POA: Diagnosis not present

## 2023-02-05 DIAGNOSIS — R4189 Other symptoms and signs involving cognitive functions and awareness: Secondary | ICD-10-CM | POA: Diagnosis not present

## 2023-02-07 DIAGNOSIS — R4189 Other symptoms and signs involving cognitive functions and awareness: Secondary | ICD-10-CM | POA: Diagnosis not present

## 2023-02-08 DIAGNOSIS — R4189 Other symptoms and signs involving cognitive functions and awareness: Secondary | ICD-10-CM | POA: Diagnosis not present

## 2023-02-09 DIAGNOSIS — R4189 Other symptoms and signs involving cognitive functions and awareness: Secondary | ICD-10-CM | POA: Diagnosis not present

## 2023-02-10 DIAGNOSIS — R4189 Other symptoms and signs involving cognitive functions and awareness: Secondary | ICD-10-CM | POA: Diagnosis not present

## 2023-02-11 DIAGNOSIS — R4189 Other symptoms and signs involving cognitive functions and awareness: Secondary | ICD-10-CM | POA: Diagnosis not present

## 2023-02-12 DIAGNOSIS — R4189 Other symptoms and signs involving cognitive functions and awareness: Secondary | ICD-10-CM | POA: Diagnosis not present

## 2023-02-13 DIAGNOSIS — R4189 Other symptoms and signs involving cognitive functions and awareness: Secondary | ICD-10-CM | POA: Diagnosis not present

## 2023-02-14 DIAGNOSIS — R4189 Other symptoms and signs involving cognitive functions and awareness: Secondary | ICD-10-CM | POA: Diagnosis not present

## 2023-02-16 DIAGNOSIS — R4189 Other symptoms and signs involving cognitive functions and awareness: Secondary | ICD-10-CM | POA: Diagnosis not present

## 2023-02-17 DIAGNOSIS — R4189 Other symptoms and signs involving cognitive functions and awareness: Secondary | ICD-10-CM | POA: Diagnosis not present

## 2023-02-18 DIAGNOSIS — R4189 Other symptoms and signs involving cognitive functions and awareness: Secondary | ICD-10-CM | POA: Diagnosis not present

## 2023-02-19 DIAGNOSIS — R4189 Other symptoms and signs involving cognitive functions and awareness: Secondary | ICD-10-CM | POA: Diagnosis not present

## 2023-02-20 ENCOUNTER — Encounter: Payer: Medicaid Other | Admitting: Student

## 2023-02-20 DIAGNOSIS — R4189 Other symptoms and signs involving cognitive functions and awareness: Secondary | ICD-10-CM | POA: Diagnosis not present

## 2023-02-21 DIAGNOSIS — R4189 Other symptoms and signs involving cognitive functions and awareness: Secondary | ICD-10-CM | POA: Diagnosis not present

## 2023-02-22 DIAGNOSIS — R4189 Other symptoms and signs involving cognitive functions and awareness: Secondary | ICD-10-CM | POA: Diagnosis not present

## 2023-02-25 ENCOUNTER — Ambulatory Visit (INDEPENDENT_AMBULATORY_CARE_PROVIDER_SITE_OTHER): Payer: Medicaid Other | Admitting: Student

## 2023-02-25 ENCOUNTER — Other Ambulatory Visit: Payer: Self-pay

## 2023-02-25 ENCOUNTER — Encounter: Payer: Self-pay | Admitting: Student

## 2023-02-25 VITALS — BP 125/69 | HR 55 | Temp 97.7°F | Resp 32 | Ht 62.0 in | Wt 123.1 lb

## 2023-02-25 DIAGNOSIS — R4189 Other symptoms and signs involving cognitive functions and awareness: Secondary | ICD-10-CM | POA: Diagnosis not present

## 2023-02-25 DIAGNOSIS — I5020 Unspecified systolic (congestive) heart failure: Secondary | ICD-10-CM | POA: Diagnosis not present

## 2023-02-25 DIAGNOSIS — F1910 Other psychoactive substance abuse, uncomplicated: Secondary | ICD-10-CM | POA: Insufficient documentation

## 2023-02-25 DIAGNOSIS — E785 Hyperlipidemia, unspecified: Secondary | ICD-10-CM

## 2023-02-25 DIAGNOSIS — F1020 Alcohol dependence, uncomplicated: Secondary | ICD-10-CM | POA: Diagnosis not present

## 2023-02-25 DIAGNOSIS — E1129 Type 2 diabetes mellitus with other diabetic kidney complication: Secondary | ICD-10-CM | POA: Diagnosis not present

## 2023-02-25 DIAGNOSIS — R809 Proteinuria, unspecified: Secondary | ICD-10-CM

## 2023-02-25 DIAGNOSIS — I10 Essential (primary) hypertension: Secondary | ICD-10-CM

## 2023-02-25 DIAGNOSIS — I110 Hypertensive heart disease with heart failure: Secondary | ICD-10-CM | POA: Diagnosis present

## 2023-02-25 DIAGNOSIS — F17200 Nicotine dependence, unspecified, uncomplicated: Secondary | ICD-10-CM

## 2023-02-25 DIAGNOSIS — I2510 Atherosclerotic heart disease of native coronary artery without angina pectoris: Secondary | ICD-10-CM | POA: Diagnosis not present

## 2023-02-25 DIAGNOSIS — Z8673 Personal history of transient ischemic attack (TIA), and cerebral infarction without residual deficits: Secondary | ICD-10-CM

## 2023-02-25 DIAGNOSIS — E119 Type 2 diabetes mellitus without complications: Secondary | ICD-10-CM

## 2023-02-25 DIAGNOSIS — F17210 Nicotine dependence, cigarettes, uncomplicated: Secondary | ICD-10-CM

## 2023-02-25 DIAGNOSIS — R630 Anorexia: Secondary | ICD-10-CM | POA: Insufficient documentation

## 2023-02-25 DIAGNOSIS — Z87898 Personal history of other specified conditions: Secondary | ICD-10-CM

## 2023-02-25 LAB — POCT GLYCOSYLATED HEMOGLOBIN (HGB A1C): Hemoglobin A1C: 5 % (ref 4.0–5.6)

## 2023-02-25 LAB — GLUCOSE, CAPILLARY: Glucose-Capillary: 79 mg/dL (ref 70–99)

## 2023-02-25 MED ORDER — NALTREXONE HCL 50 MG PO TABS
50.0000 mg | ORAL_TABLET | Freq: Every day | ORAL | 0 refills | Status: DC
Start: 2023-02-25 — End: 2023-03-25

## 2023-02-25 NOTE — Patient Instructions (Signed)
Thank you, [name] for allowing [place] to provide your care today. Today we discussed:  Alcohol Use -Start naltrexone 50 mg, 1 tablet by mouth daily; please not take any opiates with this medication. -Please continue to decrease your alcohol intake, and try to quit.  Hypertension -Continue taking amlodipine 10 mg daily  Nonobstructive CAD/ nonischemic cardiomyopathy  -Continue taking Metoprolol succinate 50 mg, Entresto 49-51 mg bid, Isordil 30 mg tid, hydralazine 50 mg tid. Jardiance 10 mg daily  Seizure -Continue taking Keppra and Vimpat.  Please avoid drinking alcohol.  T2DM -Your A1c is at goal, you do not need to take anymore metformin.  Please stop taking metformin.  I have ordered the following labs for you:  Lab Orders         Glucose, capillary         Lipid Profile         CMP14 + Anion Gap         POC Hbg A1C      Tests ordered today:  Lipid, CMP, A1c  Referrals ordered today:   Referral Orders  No referral(s) requested today     I have ordered the following medication/changed the following medications:   Stop the following medications: Medications Discontinued During This Encounter  Medication Reason   metFORMIN (GLUCOPHAGE) 500 MG tablet No longer needed (for PRN medications)     Start the following medications: Meds ordered this encounter  Medications   naltrexone (DEPADE) 50 MG tablet    Sig: Take 1 tablet (50 mg total) by mouth daily.    Dispense:  30 tablet    Refill:  0     Follow up: 2-3 months    Remember:   Should you have any questions or concerns please call the internal medicine clinic at [phone].     Arthur Pinch, DO [facility]

## 2023-02-25 NOTE — Assessment & Plan Note (Signed)
Continue taking Keppra 750 mg twice daily and Vimpat 200 mg twice daily. No new episodes of seizures.

## 2023-02-25 NOTE — Assessment & Plan Note (Signed)
BP Readings from Last 3 Encounters:  02/25/23 125/69  11/10/22 124/76  08/08/22 132/86   Patient takes amlodipine 10 mg daily. Nurse checks blood pressure at home occasionally with range ~ 120s/70s, denies any lightheadedness, dizziness, vision changes, CP, SOB, LE edema. -Continue amlodipine 10 mg daily -Will check CMP today

## 2023-02-25 NOTE — Assessment & Plan Note (Signed)
Nurse who is present with the patient raises concern for alcohol use daily. He drinks 2 24 oz cans daily and sometimes 2 shots of liquor. Nurse reports he gets alcohol from outside, his friends. He does have a history of seizure 01/2021 and currently on keppra and Vimpat. Nurse reports that after his hospitalization from 2022, he initially quit and was sober until Jan 2024 when he started going out and hanging out with his friends who brings him alcohol. I had an extensive conversation about alcohol cessation. Pt plans to quit and would like assistance.  -Start naltrexone 50 mg daily. -Pt is notified to not take any prescribed or unprescribed opioids.  -Will check LFTs

## 2023-02-25 NOTE — Assessment & Plan Note (Signed)
GDMT regimen consist of Metoprolol succinate 50 mg daily, Entresto 49-51 mg bid, Isordil 30 mg tid, hydralazine 50 mg tid, Jardiance 10 mg daily. Medications are given to him by nurse aid in the morning.  Patient denies any acute chest pain or shortness of breath.  Patient appears euvolemic on exam, no lower extremity edema. Patient follows cardiologist, last visit on 3/13.  Nurse states that his next scheduled appointment will be next year on March.  Continue GDMT.

## 2023-02-25 NOTE — Progress Notes (Addendum)
Established Patient Office Visit  Subjective   Patient ID: Arthur [name], male    DOB: 08-Aug-1960  Age: 62 y.o. MRN: [id number]  Chief Complaint  Patient presents with   Alcohol Problem    Need medication to help stop   Follow-up   Anorexia    Dentures     HPI  This is a 62 y.o. person with medical history as below presenting to [facility] for A1c check.  Patient is here with a nurse.   Please see problem-based list for further details, assessments, and plans.  Patient Active Problem List   Diagnosis Date Noted   Poor appetite 02/25/2023   Substance abuse (HCC) 02/25/2023   Healthcare maintenance 07/11/2022   Tobacco use disorder 07/11/2022   Pain due to onychomycosis of toenails of both feet 07/05/2022   Porokeratosis 07/05/2022   Vitamin D deficiency 09/07/2021   Cognitive impairment 09/06/2021   Coronary artery disease    HFrEF (heart failure with reduced ejection fraction) (HCC) 05/10/2021   Seizures (HCC) 02/24/2021   Aortic atherosclerosis (HCC) 11/22/2020   History of seizures 11/22/2020   Alcohol use disorder, severe, dependence (HCC) 11/22/2020   Controlled type 2 diabetes mellitus with microalbuminuria, without long-term current use of insulin (HCC) 11/22/2020   Anemia 11/22/2020   Hyperlipidemia 11/22/2020   Essential hypertension 10/08/2019   History of CVA (cerebrovascular accident) 10/02/2019   Past Medical History:  Diagnosis Date   DM2 (diabetes mellitus, type 2) (HCC)    ETOH abuse    Hypertension    Seizures (HCC)    Transaminitis    Past Surgical History:  Procedure Laterality Date   CORONARY PRESSURE/FFR STUDY N/A 06/27/2021   Procedure: INTRAVASCULAR PRESSURE WIRE/FFR STUDY;  Surgeon: Elder [name], MD;  Location: [facility];  Service: Cardiovascular;  Laterality: N/A;   HEMORRHOID SURGERY     LEFT HEART CATH AND CORONARY ANGIOGRAPHY N/A 06/27/2021   Procedure: LEFT HEART CATH AND CORONARY ANGIOGRAPHY;  Surgeon: Elder [name],  MD;  Location: [facility];  Service: Cardiovascular;  Laterality: N/A;   Social History   Socioeconomic History   Marital status: Married    Spouse name: [name]   Number of children: 6   Years of education: Not on file   Highest education level: Not on file  Occupational History   Not on file  Tobacco Use   Smoking status: Some Days    Types: Cigars   Smokeless tobacco: Never   Tobacco comments:    Smokes black & mild - 2 cigars a day.  Vaping Use   Vaping status: Never Used  Substance and Sexual Activity   Alcohol use: Not Currently   Drug use: Never   Sexual activity: Not on file  Other Topics Concern   Not on file  Social History Narrative   Right handed   Lives at home with wife    2-3 cups of cadd per day    Social Determinants of Health   Financial Resource Strain: Medium Risk (07/23/2022)   Overall Financial Resource Strain (CARDIA)    Difficulty of Paying Living Expenses: Somewhat hard  Food Insecurity: No Food Insecurity (10/08/2022)   Hunger Vital Sign    Worried About Running Out of Food in the Last Year: Never true    Ran Out of Food in the Last Year: Never true  Transportation Needs: No Transportation Needs (01/07/2023)   PRAPARE - Administrator, Civil Service (Medical): No  Lack of Transportation (Non-Medical): No  Physical Activity: Insufficiently Active (08/21/2022)   Exercise Vital Sign    Days of Exercise per Week: 7 days    Minutes of Exercise per Session: 10 min  Stress: No Stress Concern Present (01/07/2023)   [employer] of Occupational Health - Occupational Stress Questionnaire    Feeling of Stress : Not at all  Social Connections: Socially Integrated (08/21/2022)   Social Connection and Isolation Panel [NHANES]    Frequency of Communication with Friends and Family: More than three times a week    Frequency of Social Gatherings with Friends and Family: More than three times a week    Attends Religious Services: More  than 4 times per year    Active Member of [employer] or Organizations: Yes    Attends Engineer, structural: More than 4 times per year    Marital Status: Married  Catering manager Violence: Not At Risk (12/07/2022)   Humiliation, Afraid, Rape, and Kick questionnaire    Fear of Current or Ex-Partner: No    Emotionally Abused: No    Physically Abused: No    Sexually Abused: No   Family Status  Relation Name Status   Mother  Alive   Father  Deceased   Sister  Alive   Sister  Alive   Brother  Alive  No partnership data on file   Family History  Problem Relation Age of Onset   Diabetes Mother    Hypertension Mother    Diabetes Father    Hypertension Father    Hypertension Sister    Allergies  Allergen Reactions   Penicillins Hives    Did it involve swelling of the face/tongue/throat, SOB, or low BP? Y Did it involve sudden or severe rash/hives, skin peeling, or any reaction on the inside of your mouth or nose? N Did you need to seek medical attention at a hospital or doctor's office? Y When did it last happen?  Over 5 Years Ago     If all above answers are "NO", may proceed with cephalosporin use.     Review of Systems  Constitutional:  Negative for chills and fever.  HENT:  Negative for congestion and sore throat.   Respiratory:  Negative for shortness of breath.   Cardiovascular:  Negative for chest pain, palpitations, claudication and leg swelling.  Gastrointestinal:  Negative for abdominal pain, diarrhea, nausea and vomiting.  Neurological:  Negative for weakness and headaches.      Objective:     BP 125/69 (BP Location: Right Arm, Patient Position: Sitting, Cuff Size: Normal)   Pulse (!) 55   Temp 97.7 F (36.5 C) (Oral)   Resp (!) 32   Ht 5\' 2"  (1.575 m)   Wt 123 lb 1.6 oz (55.8 kg)   SpO2 100% Comment: room air  BMI 22.52 kg/m  BP Readings from Last 3 Encounters:  02/25/23 125/69  11/10/22 124/76  08/08/22 132/86   Wt Readings from Last 3  Encounters:  02/25/23 123 lb 1.6 oz (55.8 kg)  11/10/22 125 lb (56.7 kg)  08/08/22 127 lb (57.6 kg)    Physical Exam  Constitutional: well-appearing,  sitting in chair, in no acute distress HENT: normocephalic atraumatic, mucous membranes moist Cardiovascular: regular rate and rhythm, no m/r/g Pulmonary/Chest: normal work of breathing on room air, lungs clear to auscultation bilaterally Abdominal: soft, non-tender, non-distended MSK: normal bulk and tone Neurological: alert & oriented x 3, no focal deficit Skin: warm and dry Psych: normal mood  and behavior  Results for orders placed or performed in visit on 02/25/23  Glucose, capillary  Result Value Ref Range   Glucose-Capillary 79 70 - 99 mg/dL  POC Hbg D6L  Result Value Ref Range   Hemoglobin A1C 5.0 4.0 - 5.6 %   HbA1c POC (<> result, manual entry)     HbA1c, POC (prediabetic range)     HbA1c, POC (controlled diabetic range)      Last CBC Lab Results  Component Value Date   WBC 5.1 11/10/2022   HGB 12.4 (L) 11/10/2022   HCT 37.1 (L) 11/10/2022   MCV 77.3 (L) 11/10/2022   MCH 25.8 (L) 11/10/2022   RDW 15.5 11/10/2022   PLT 225 11/10/2022   Last metabolic panel Lab Results  Component Value Date   GLUCOSE 87 11/10/2022   NA 137 11/10/2022   K 3.6 11/10/2022   CL 103 11/10/2022   CO2 20 (L) 11/10/2022   BUN 11 11/10/2022   CREATININE 1.58 (H) 11/10/2022   GFRNONAA 50 (L) 11/10/2022   CALCIUM 9.1 11/10/2022   PHOS 3.9 10/22/2020   PROT 6.5 11/10/2022   ALBUMIN 3.6 11/10/2022   LABGLOB 3.1 09/05/2021   AGRATIO 1.5 09/05/2021   BILITOT 0.6 11/10/2022   ALKPHOS 57 11/10/2022   AST 25 11/10/2022   ALT 18 11/10/2022   ANIONGAP 14 11/10/2022   Last lipids Lab Results  Component Value Date   CHOL 147 03/23/2021   HDL 36 (L) 03/23/2021   LDLCALC 86 03/23/2021   LDLDIRECT 124.3 (H) 10/17/2020   TRIG 127 03/23/2021   CHOLHDL 4.1 03/23/2021      The ASCVD Risk score (Arnett DK, et al., 2019) failed to  calculate for the following reasons:   The patient has a prior MI or stroke diagnosis    Assessment & Plan:    Problem List Items Addressed This Visit       Cardiovascular and Mediastinum   Essential hypertension (Chronic)    BP Readings from Last 3 Encounters:  02/25/23 125/69  11/10/22 124/76  08/08/22 132/86   Patient takes amlodipine 10 mg daily. Nurse checks blood pressure at home occasionally with range ~ 120s/70s, denies any lightheadedness, dizziness, vision changes, CP, SOB, LE edema. -Continue amlodipine 10 mg daily -Will check CMP today      Relevant Orders   CMP14 + Anion Gap   HFrEF (heart failure with reduced ejection fraction) (HCC)    GDMT regimen consist of Metoprolol succinate 50 mg daily, Entresto 49-51 mg bid, Isordil 30 mg tid, hydralazine 50 mg tid, Jardiance 10 mg daily. Medications are given to him by nurse aid in the morning.  Patient denies any acute chest pain or shortness of breath.  Patient appears euvolemic on exam, no lower extremity edema. Patient follows cardiologist, last visit on 3/13.  Nurse states that his next scheduled appointment will be next year on March.  Continue GDMT.      Relevant Orders   CMP14 + Anion Gap   Coronary artery disease    Patient takes Aspirin 81 mg daily and Lipitor 40 mg daily in the morning, administered by nurse.  Continue as prescribed. -Will check lipid panel today        Endocrine   Controlled type 2 diabetes mellitus with microalbuminuria, without long-term current use of insulin (HCC)    Lab Results  Component Value Date   HGBA1C 5.0 02/25/2023   HGBA1C 5.6 07/10/2022   HGBA1C 5.9 (A) 12/21/2021   Nurse is  present at this visit, reports he takes metformin 500 mg twice a day, nurse gives him the morning dose. He is not taking the evening dose. Otherwise no other concerns at this time. A1c at this visit is 5.0 compared to last A1c 5.6 -DISCONTINUE metformin BID        Other   History of seizures  (Chronic)    Continue taking Keppra 750 mg twice daily and Vimpat 200 mg twice daily. No new episodes of seizures.       Alcohol use disorder, severe, dependence (HCC) (Chronic)    Nurse who is present with the patient raises concern for alcohol use daily. He drinks 2 24 oz cans daily and sometimes 2 shots of liquor. Nurse reports he gets alcohol from outside, his friends. He does have a history of seizure 01/2021 and currently on keppra and Vimpat. Nurse reports that after his hospitalization from 2022, he initially quit and was sober until Jan 2024 when he started going out and hanging out with his friends who brings him alcohol. I had an extensive conversation about alcohol cessation. Pt plans to quit and would like assistance.  -Start naltrexone 50 mg daily. -Pt is notified to not take any prescribed or unprescribed opioids.  -Will check LFTs      Relevant Medications   naltrexone (DEPADE) 50 MG tablet   Other Relevant Orders   CMP14 + Anion Gap   Hyperlipidemia (Chronic)    No lipid panel per chart review. Will check lipid panel here today. Continue statin.       History of CVA (cerebrovascular accident)    CVA in 10/16/2020; patient is on aspirin 81 mg and statin. Continue.       Tobacco use disorder    Black & Mild cigars, 2-3 a day. No intention of quitting.  - Counseling provided.       Poor appetite    Wt Readings from Last 3 Encounters:  02/25/23 123 lb 1.6 oz (55.8 kg)  11/10/22 125 lb (56.7 kg)  08/08/22 127 lb (57.6 kg)    Patient presents here to the clinic with concerns of poor appetite.  Nurse at bedside notes that he had his teeth removed back in March 2024, with dentures are replaced.  He reports he does not like to wear his dentures because he is not used to them.  His diet consist of eating grits, pancakes, sausages for breakfast.  Yesterday for lunch he had beef burrito.  He is dinner varies, he had spaghetti yesterday for dinner.  Nurse at bedside notes that he  loves eating McDonald's.  He also notes that he likes eating McDonald[employer].  He likes eating junk food.  His weight has not changed considerably over the course of 3 office visits, it has stayed at about 56 to 57 kg.  So no noted major weight loss.  It seems that patient is a picky eater.  He does eat throughout the day however he chooses to eat what ever he wants.  Patient is counseled on better eating habits.      Substance abuse (HCC)    There is ascites concerned about substance abuse.  She mentions that wife at home states that he has been going outside with his friends and has been getting alcohol and other substances.  She would like for him to get checked for substances. Patient consents to it as well. -urine drug screen is ordered.       Relevant Orders   ToxAssure Select,+Antidepr,UR  Other Visit Diagnoses     Type 2 diabetes mellitus without complication, without long-term current use of insulin (HCC)    -  Primary   Relevant Orders   POC Hbg A1C (Completed)   Glucose, capillary (Completed)   Lipid Profile       Return in about 3 months (around 05/27/2023) for routine f/u.    Jeral Pinch, DO

## 2023-02-25 NOTE — Assessment & Plan Note (Signed)
CVA in 10/16/2020; patient is on aspirin 81 mg and statin. Continue.

## 2023-02-25 NOTE — Assessment & Plan Note (Signed)
There is ascites concerned about substance abuse.  She mentions that wife at home states that he has been going outside with his friends and has been getting alcohol and other substances.  She would like for him to get checked for substances. Patient consents to it as well. -urine drug screen is ordered.

## 2023-02-25 NOTE — Progress Notes (Signed)
Internal Medicine Clinic Attending  I was physically present during the key portions of the resident provided service and participated in the medical decision making of patient's management care. I reviewed pertinent patient test results.  The assessment, diagnosis, and plan were formulated together and I agree with the documentation in the resident's note.  [name], MD

## 2023-02-25 NOTE — Assessment & Plan Note (Signed)
No lipid panel per chart review. Will check lipid panel here today. Continue statin.

## 2023-02-25 NOTE — Assessment & Plan Note (Signed)
Wt Readings from Last 3 Encounters:  02/25/23 123 lb 1.6 oz (55.8 kg)  11/10/22 125 lb (56.7 kg)  08/08/22 127 lb (57.6 kg)    Patient presents here to the clinic with concerns of poor appetite.  Nurse at bedside notes that he had his teeth removed back in March 2024, with dentures are replaced.  He reports he does not like to wear his dentures because he is not used to them.  His diet consist of eating grits, pancakes, sausages for breakfast.  Yesterday for lunch he had beef burrito.  He is dinner varies, he had spaghetti yesterday for dinner.  Nurse at bedside notes that he loves eating McDonald's.  He also notes that he likes eating McDonald's.  He likes eating junk food.  His weight has not changed considerably over the course of 3 office visits, it has stayed at about 56 to 57 kg.  So no noted major weight loss.  It seems that patient is a picky eater.  He does eat throughout the day however he chooses to eat what ever he wants.  Patient is counseled on better eating habits.

## 2023-02-25 NOTE — Assessment & Plan Note (Signed)
Lab Results  Component Value Date   HGBA1C 5.0 02/25/2023   HGBA1C 5.6 07/10/2022   HGBA1C 5.9 (A) 12/21/2021   Nurse is present at this visit, reports he takes metformin 500 mg twice a day, nurse gives him the morning dose. He is not taking the evening dose. Otherwise no other concerns at this time. A1c at this visit is 5.0 compared to last A1c 5.6 -DISCONTINUE metformin BID

## 2023-02-25 NOTE — Assessment & Plan Note (Signed)
Patient takes Aspirin 81 mg daily and Lipitor 40 mg daily in the morning, administered by nurse.  Continue as prescribed. -Will check lipid panel today

## 2023-02-25 NOTE — Addendum Note (Signed)
Addended by: Jeral Pinch on: 02/25/2023 01:25 PM   Modules accepted: Orders

## 2023-02-25 NOTE — Assessment & Plan Note (Signed)
Black & Mild cigars, 2-3 a day. No intention of quitting.  - Counseling provided.

## 2023-02-26 ENCOUNTER — Other Ambulatory Visit: Payer: Self-pay | Admitting: Student

## 2023-02-26 DIAGNOSIS — E875 Hyperkalemia: Secondary | ICD-10-CM

## 2023-02-26 DIAGNOSIS — R4189 Other symptoms and signs involving cognitive functions and awareness: Secondary | ICD-10-CM | POA: Diagnosis not present

## 2023-02-26 LAB — LIPID PANEL
Chol/HDL Ratio: 1.7 {ratio} (ref 0.0–5.0)
Cholesterol, Total: 109 mg/dL (ref 100–199)
HDL: 66 mg/dL (ref >39)
LDL Chol Calc (NIH): 33 mg/dL (ref 0–99)
Triglycerides: 32 mg/dL (ref 0–149)
VLDL Cholesterol Cal: 10 mg/dL (ref 5–40)

## 2023-02-26 LAB — CMP14 + ANION GAP
ALT: 19 [IU]/L (ref 0–44)
AST: 23 [IU]/L (ref 0–40)
Albumin: 4.6 g/dL (ref 3.8–4.9)
Alkaline Phosphatase: 83 [IU]/L (ref 44–121)
Anion Gap: 14 mmol/L (ref 10.0–18.0)
BUN/Creatinine Ratio: 14 (ref 10–24)
BUN: 13 mg/dL (ref 8–27)
Bilirubin Total: 0.6 mg/dL (ref 0.0–1.2)
CO2: 22 mmol/L (ref 20–29)
Calcium: 9.5 mg/dL (ref 8.6–10.2)
Chloride: 99 mmol/L (ref 96–106)
Creatinine, Ser: 0.9 mg/dL (ref 0.76–1.27)
Globulin, Total: 2.8 g/dL (ref 1.5–4.5)
Glucose: 73 mg/dL (ref 70–99)
Potassium: 5.8 mmol/L — ABNORMAL HIGH (ref 3.5–5.2)
Sodium: 135 mmol/L (ref 134–144)
Total Protein: 7.4 g/dL (ref 6.0–8.5)
eGFR: 98 mL/min/{1.73_m2} (ref >59)

## 2023-02-26 NOTE — Progress Notes (Signed)
Called the patient again, wife picked up. Patient is notified about the elevated potassium. Patient denies any acute chest pain, shortness of breath, palpitations, lightheadedness or dizziness. Pt's wife states that they can not come to the clinic today because the nurse who takes care of him, has left. They will try tomorrow to come to clinic to get a recheck of potassium. Orders are in. \\  Of note: no obvious reasons for elevated potassium. Pt is taking ARB, however for about 3 years. No abnormal renal function.

## 2023-02-26 NOTE — Progress Notes (Signed)
Called patient x3, no one picked up. The phone number listed is from [name], pt's spouse. Will try again later today.

## 2023-02-27 DIAGNOSIS — R4189 Other symptoms and signs involving cognitive functions and awareness: Secondary | ICD-10-CM | POA: Diagnosis not present

## 2023-03-01 LAB — TOXASSURE SELECT,+ANTIDEPR,UR

## 2023-03-04 DIAGNOSIS — R4189 Other symptoms and signs involving cognitive functions and awareness: Secondary | ICD-10-CM | POA: Diagnosis not present

## 2023-03-05 DIAGNOSIS — R4189 Other symptoms and signs involving cognitive functions and awareness: Secondary | ICD-10-CM | POA: Diagnosis not present

## 2023-03-06 DIAGNOSIS — R4189 Other symptoms and signs involving cognitive functions and awareness: Secondary | ICD-10-CM | POA: Diagnosis not present

## 2023-03-07 DIAGNOSIS — R4189 Other symptoms and signs involving cognitive functions and awareness: Secondary | ICD-10-CM | POA: Diagnosis not present

## 2023-03-08 ENCOUNTER — Other Ambulatory Visit: Payer: Medicaid Other | Admitting: Obstetrics and Gynecology

## 2023-03-08 DIAGNOSIS — R4189 Other symptoms and signs involving cognitive functions and awareness: Secondary | ICD-10-CM | POA: Diagnosis not present

## 2023-03-08 NOTE — Patient Outreach (Signed)
Medicaid Managed Care   Nurse Care Manager Note  03/08/2023 Name:  Arthur [name] MRN:  [id number] DOB:  12-20-1960  Arthur [name] is an 62 y.o. year old male who is a primary patient of Modena Slater, DO.  The [facility] Managed Care Coordination team was consulted for assistance with:    Chronic healthcare management needs, h/o ETOH abuse, tobacco use, h/o seizures, h/o syncope, DM, HLD, CAD  Arthur [name] was given information about Medicaid Managed Care Coordination team services today. [name] Patient agreed to services and verbal consent obtained.  Engaged with patient by telephone for follow up visit in response to provider referral for case management and/or care coordination services.   Assessments/Interventions:  Review of past medical history, allergies, medications, health status, including review of consultants reports, laboratory and other test data, was performed as part of comprehensive evaluation and provision of chronic care management services.  SDOH (Social Determinants of Health) assessments and interventions performed: SDOH Interventions    Flowsheet Row Patient Outreach Telephone from 03/08/2023 in [place] POPULATION HEALTH DEPARTMENT Patient Outreach Telephone from 01/07/2023 in [place] POPULATION HEALTH DEPARTMENT Patient Outreach Telephone from 12/07/2022 in [place] POPULATION HEALTH DEPARTMENT Patient Outreach Telephone from 10/08/2022 in [place] POPULATION HEALTH DEPARTMENT Patient Outreach Telephone from 08/21/2022 in [place] POPULATION HEALTH DEPARTMENT Patient Outreach Telephone from 07/23/2022 in [place] POPULATION HEALTH DEPARTMENT  SDOH Interventions        Food Insecurity Interventions Other (Comment)  [has food resources, declines further resources at this time] -- -- Intervention Not Indicated -- --  Housing Interventions -- -- -- Intervention Not Indicated -- --  Transportation Interventions -- Intervention Not Indicated -- -- -- --  Utilities  Interventions -- -- -- -- -- Intervention Not Indicated  Alcohol Usage Interventions Intervention Not Indicated (Score <7) -- -- -- -- --  Financial Strain Interventions -- -- -- -- -- Other (Comment)  [patient has been given resources, DPR]  Physical Activity Interventions -- -- -- -- Other (Comments)  [patient deconditioned, balance issues per DPR, patient's wife to f/u with provider] --  Stress Interventions -- Intervention Not Indicated -- -- -- --  Health Literacy Interventions -- -- Intervention Not Indicated -- -- --     Care Plan Allergies  Allergen Reactions   Penicillins Hives    Did it involve swelling of the face/tongue/throat, SOB, or low BP? Y Did it involve sudden or severe rash/hives, skin peeling, or any reaction on the inside of your mouth or nose? N Did you need to seek medical attention at a hospital or doctor's office? Y When did it last happen?  Over 5 Years Ago     If all above answers are "NO", may proceed with cephalosporin use.    Medications Reviewed Today     Reviewed by Danie Chandler, RN (Registered Nurse) on 03/08/23 at [phone]  Med List Status: <None>   Medication Order Taking? Sig Documenting Provider Last Dose Status Informant  amLODipine (NORVASC) 10 MG tablet [id number]  Take 1 tablet (10 mg total) by mouth daily. Elder [name], MD  Active   aspirin EC 81 MG tablet [id number] No Take 1 tablet (81 mg total) by mouth daily. Swallow whole. [name], [name], PA-C Taking Active   aspirin EC 81 MG tablet [id number] No Take 1 tablet (81 mg total) by mouth daily. Swallow whole. Patwardhan, Anabel Bene, MD Taking Active   atorvastatin (LIPITOR) 40 MG tablet [id number]  Take 1 tablet (  40 mg total) by mouth daily. Elder [name], MD  Active   Blood Pressure Monitor KIT [id number] No Use to check blood pressure dailly. [name], MD Taking Active   cholecalciferol (VITAMIN D) 25 MCG (1000 UNIT) tablet [id number] No Take by mouth.  Taking Active    empagliflozin (JARDIANCE) 10 MG TABS tablet [id number]  Take 1 tablet (10 mg total) by mouth daily before breakfast. Patwardhan, Anabel Bene, MD  Active   hydrALAZINE (APRESOLINE) 50 MG tablet [id number]  Take 1 tablet (50 mg total) by mouth every 8 (eight) hours. Patwardhan, Anabel Bene, MD  Active   isosorbide dinitrate (ISORDIL) 30 MG tablet [id number]  Take 1 tablet (30 mg total) by mouth 3 (three) times daily. Patwardhan, Anabel Bene, MD  Active   lacosamide (VIMPAT) 200 MG TABS tablet [id number]  TAKE 1 TABLET (200 MG TOTAL) BY MOUTH 2 (TWO) TIMES DAILY.(AM+PM) Penumalli, Glenford Bayley, MD  Active   levETIRAcetam (KEPPRA) 750 MG tablet [id number] No Take 2 tablets (1,500 mg total) by mouth 2 (two) times daily. Penumalli, Glenford Bayley, MD Taking Active   metoprolol succinate (TOPROL-XL) 100 MG 24 hr tablet [id number]  Take 0.5 tablets (50 mg total) by mouth daily. Take with or immediately following a meal. Patwardhan, Manish J, MD  Active   naltrexone (DEPADE) 50 MG tablet [id number]  Take 1 tablet (50 mg total) by mouth daily. Tawkaliyar, Roya, DO  Active   sacubitril-valsartan (ENTRESTO) 49-51 MG [id number]  Take 1 tablet by mouth 2 (two) times daily. Start on 06/29/2021 Elder [name], MD  Active   Med List Note [name], CPhT 02/24/21 1756): [place]: "12/08/20 receiving PAP through [employer], approved until 12/08/2022"           Patient Active Problem List   Diagnosis Date Noted   Poor appetite 02/25/2023   Substance abuse (HCC) 02/25/2023   Healthcare maintenance 07/11/2022   Tobacco use disorder 07/11/2022   Pain due to onychomycosis of toenails of both feet 07/05/2022   Porokeratosis 07/05/2022   Vitamin D deficiency 09/07/2021   Cognitive impairment 09/06/2021   Coronary artery disease    HFrEF (heart failure with reduced ejection fraction) (HCC) 05/10/2021   Seizures (HCC) 02/24/2021   Aortic atherosclerosis (HCC) 11/22/2020   History of seizures 11/22/2020   Alcohol use disorder, severe,  dependence (HCC) 11/22/2020   Controlled type 2 diabetes mellitus with microalbuminuria, without long-term current use of insulin (HCC) 11/22/2020   Anemia 11/22/2020   Hyperlipidemia 11/22/2020   Essential hypertension 10/08/2019   History of CVA (cerebrovascular accident) 10/02/2019   Conditions to be addressed/monitored per PCP order:  Chronic healthcare management needs, h/o ETOH abuse, tobacco use, h/o seizures, h/o syncope, DM, HLD, CAD  Care Plan : RN Care Manager Plan of Care  Updates made by Danie Chandler, RN since 03/08/2023 12:00 AM     Problem: Chronic Disease Management and Care Coordination Needs for DMII, HTN, CAD, Seizures   Priority: High     Long-Range Goal: Development of Plan of Care for Chronic Disease Management and Care Coordination Needs (CAD, HTN, DMII, Seizures)   Start Date: 08/08/2021  Expected End Date: 06/08/2023  Priority: High  Note:   Current Barriers:  Knowledge Deficits related to plan of care for management of CAD, HTN, DMII, and Seizures, tobacco use, HLD, h/o CVA Care Coordination needs related to Financial constraints related to affordable safe housing and utility payment, Housing barriers, Medication procurement, ADL IADL limitations, Literacy concerns, Cognitive Deficits, Memory  Deficits, Inability to perform IADL's independently, and Lacks knowledge of community resource: safe & affordable Section 8 Housing, utility payment assistance and dental care providers covered by [employer]  Chronic Disease Management support and education needs related to CAD, HTN, DMII, and Seizures, tobacco use, HLD, h/o CVA Corporate treasurer.  Non-adherence to prescribed medication regimen Difficulty obtaining medications Cognitive Deficits No Advanced Directives in place Falls - Increased Potential for Falls 03/08/23:  BP and BG stable-smokes 3 cigars a day-no ETOH intake per patient's wife, DPR.  [place] 1 Ensure a day.  No syncopal or  seizure activity.    RNCM Clinical Goal(s):  Patient/ DPR  will verbalize understanding of plan for management of CAD, HTN, DMII, and Seizures as evidenced by improved management of these chronic diseases. verbalize basic understanding of CAD, HTN, DMII, and Seizures disease process and self health management plan as evidenced by noted improvement of management of these chronic diseases. take all medications exactly as prescribed and will call provider for medication related questions as evidenced by being compliant with all medications    attend all scheduled medical appointments     demonstrate improved adherence to prescribed treatment plan for CAD, HTN, DMII, and Seizures as evidenced by overall improved management of these chronic diseases continue to work with Medical illustrator and/or Social Worker to address care management and care coordination needs related to CAD, HTN, DMII, and Seizures as evidenced by adherence to CM Team Scheduled appointments     work with pharmacist to address Financial constraints related to obtaining medications and Medication procurement related to CAD, HTN, DMII, and Seizures as evidenced by review of EMR and patient or pharmacist report    work with Child psychotherapist to address Financial constraints related to safe affordable Housing and utility payment , Housing barriers, ADL IADL limitations, Cognitive Deficits, Memory Deficits, Inability to perform IADL's independently, and Lacks knowledge of community resource: safe and affordable Section 8 Housing, utility payment assistance and dental care providers covered by [employer] insurance related to the management of CAD, HTN, DMII, and Seizures as evidenced by review of EMR and patient or Child psychotherapist report through collaboration with Medical illustrator, provider, and care team.   Interventions: Inter-disciplinary care team collaboration (see longitudinal plan of care) Evaluation of current treatment plan related to   self management and patient's adherence to plan as established by provider BSW referral for Podiatry resources-completed Collaborated with BSW. Pharmacy referral for medication assistance-completed Collaborated with Pharmacy  CAD  (Status: Goal on Track (progressing): YES.) Long Term Goal  Assessed understanding of CAD diagnosis Medications reviewed including medications utilized in CAD treatment plan Provided education on importance of blood pressure control in management of CAD; Counseled on the importance of exercise goals with target of 150 minutes per week Reviewed Importance of taking all medications as prescribed Reviewed Importance of attending all scheduled provider appointments Assessed social determinant of health barriers;  Diabetes:  (Status: Goal on Track (progressing): YES.) Long Term Goal  Lab Results  Component Value Date   HGBA1C=5.0 on 02/25/23     HGBA1C=5.9 on 12/21/21  HGBA1C=5.6 on 07/23/22  Assessed patient's / DPR's understanding of A1c goal: <7% Provided education to patient / DPR about basic DM disease process; Reviewed medications with patient and discussed importance of medication adherence;        Discussed plans with patient / DPR for ongoing care management follow up and provided patient with direct contact information for care management team;  Reviewed scheduled/upcoming provider appointments        Referral made to pharmacy team for assistance with complex medication regimen; [facility] Pharmacist involved in care;       Referral made to social work team for assistance with housing and utility payment assistance, dental care providers covered by Managed Medicaid; [facility] BSW involved in care;      Assessed social determinant of health barriers;        [name] denies any episodes or any signs/symptoms of Hypoglycemia or Hyperglycemia.   Seizures  (Status: Goal on Track (progressing): YES.) Long Term Goal  Evaluation of current treatment plan related to   Seizures ,  and any seizure activity,  self-management and patient's / DPR's adherence to plan as established by provider. Discussed plans with patient / DPR for ongoing care management follow up and provided patient / DPR with direct contact information for care management team Advised patient / DPR to report any seizure activity to PCP; Reviewed medications with patient and discussed importance of medication compliance; Reviewed scheduled/upcoming provider appointments  Social Work referral for Housing, utility payment and dental care providers-completed Pharmacy referral for complex medication regimen; Assessed social determinant of health barriers;  [name] reports no recent seizures.  Patient currently has all medications per patient's wife.  Hypertension: (Status: Goal on Track (progressing): YES.) Long Term Goal  Last practice recorded BP readings:  BP Readings from Last 3 Encounters:              02/25/23-110/85    07/10/22- 177/101    11/10/22-126/75  Most recent eGFR/CrCl:  Lab Results  Component Value Date   EGFR 92 06/06/2021    No components found for: CRCL  Evaluation of current treatment plan related to hypertension self management and patient's / DPR's adherence to plan as established by provider;   Reviewed prescribed diet Low Salt, Heart Healthy Reviewed medications with patient / DPR and discussed importance of compliance  Provided assistance with obtaining home blood pressure monitor via contacting  [facility].  [facility] staff confirmed that blood pressure monitor is ready.  I requested they deliver the monitor to patient's new home.  [facility] will deliver the monitor.;  Counseled on the importance of exercise goals with target of 150 minutes per week Discussed plans with patient for ongoing care management follow up and provided patient / DPR with direct contact information for care management team; Reviewed scheduled/upcoming provider  appointments Assessed social determinant of health barriers;   Patient Goals/Self-Care Activities: Take medications as prescribed   Attend all scheduled provider appointments Call pharmacy for medication refills 3-7 days in advance of running out of medications Call provider office for new concerns or questions  Work with the social worker to address care coordination needs and will continue to work with the clinical team to address health care and disease management related needs [name] to contact PCP for shower chair and PT. [name] to schedule Podiatry appointment    Long-Range Goal: Establish Plan of Care for Chronic Disease Management Needs   Priority: High  Note:   Timeframe:  Long-Range Goal Priority:  High Start Date:    12/05/21                         Expected End Date:  ongoing                     Follow Up Date 05/08/23   -  schedule appointment for vaccines needed due to my age or health - schedule recommended health tests - schedule and keep appointment for annual check-up    Why is this important?   Screening tests can find diseases early when they are easier to treat.  Your doctor or nurse will talk with you about which tests are important for you.  Getting shots for common diseases like the flu and shingles will help prevent them.   03/08/23:  PCP visit 9/30.  CARDS in March   Follow Up:  Patient agrees to Care Plan and Follow-up.  Plan: The Managed Medicaid care management team will reach out to the patient again over the next 60 business  days. and The  Patient has been provided with contact information for the Managed Medicaid care management team and has been advised to call with any health related questions or concerns.  Date/time of next scheduled RN care management/care coordination outreach: 05/08/23 at 1030

## 2023-03-08 NOTE — Patient Instructions (Signed)
Hi [name], thank you for speaking with me today and updating me-have a nice day and weekend!  [name] / [name] was given information about Medicaid Managed Care team care coordination services as a part of their [facility] Community Plan Medicaid benefit. Arthur [name] / [name] verbally consented to engagement with the [facility] Managed Care team.   If you are experiencing a medical emergency, please call 911 or report to your local emergency department or urgent care.   If you have a non-emergency medical problem during routine business hours, please contact your provider's office and ask to speak with a nurse.   For questions related to your [facility], please call: [phone] or visit the homepage here: [email or web address]  If you would like to schedule transportation through your [facility], please call the following number at least 2 days in advance of your appointment: [phone]   Rides for urgent appointments can also be made after hours by calling Member Services.  Call the Behavioral Health Crisis Line at [phone], at any time, 24 hours a day, 7 days a week. If you are in danger or need immediate medical attention call 911.  If you would like help to quit smoking, call 1-800-QUIT-NOW (8256457794) OR Espaol: 1-855-Djelo-Ya (1-324-401-0272) o para ms informacin haga clic aqu or Text READY to 536-644 to register via text  [name] / [name]- following are the goals we discussed in your visit today:   Goals Addressed    Timeframe:  Long-Range Goal Priority:  High Start Date:    12/05/21                         Expected End Date:  ongoing                     Follow Up Date 05/08/23   - schedule appointment for vaccines needed due to my age or health - schedule recommended health tests - schedule and keep appointment for annual  check-up    Why is this important?   Screening tests can find diseases early when they are easier to treat.  Your doctor or nurse will talk with you about which tests are important for you.  Getting shots for common diseases like the flu and shingles will help prevent them.   03/08/23:  PCP visit 9/30.  CARDS in March  Patient / DPR verbalizes understanding of instructions and care plan provided today and agrees to view in MyChart. Active MyChart status and patient / DPR understanding of how to access instructions and care plan via MyChart confirmed with patient/DPR.    The Managed Medicaid care management team will reach out to the patient / DPR again over the next 60 business  days.  The  Patient / DPR  has been provided with contact information for the Managed Medicaid care management team and has been advised to call with any health related questions or concerns.   Arthur Der RN, BSN [place]  [facility] Care Management Coordinator - Managed Medicaid High Risk [phone]   Following is a copy of your plan of care:  Care Plan : RN Care Manager Plan of Care  Updates made by Danie Chandler, RN since 03/08/2023 12:00 AM     Problem: Chronic Disease Management and Care Coordination Needs for DMII, HTN, CAD, Seizures   Priority: High     Long-Range Goal:  Development of Plan of Care for Chronic Disease Management and Care Coordination Needs (CAD, HTN, DMII, Seizures)   Start Date: 08/08/2021  Expected End Date: 06/08/2023  Priority: High  Note:   Current Barriers:  Knowledge Deficits related to plan of care for management of CAD, HTN, DMII, and Seizures, tobacco use, HLD, h/o CVA Care Coordination needs related to Financial constraints related to affordable safe housing and utility payment, Housing barriers, Medication procurement, ADL IADL limitations, Literacy concerns, Cognitive Deficits, Memory Deficits, Inability to perform IADL's independently, and Lacks  knowledge of community resource: safe & affordable Section 8 Housing, utility payment assistance and dental care providers covered by [employer]  Chronic Disease Management support and education needs related to CAD, HTN, DMII, and Seizures, tobacco use, HLD, h/o CVA Corporate treasurer.  Non-adherence to prescribed medication regimen Difficulty obtaining medications Cognitive Deficits No Advanced Directives in place Falls - Increased Potential for Falls 03/08/23:  BP and BG stable-smokes 3 cigars a day-no ETOH intake per patient's wife, DPR.  Drinijing 1 Ensure a day.  No syncopal or seizure activity.    RNCM Clinical Goal(s):  Patient/ DPR  will verbalize understanding of plan for management of CAD, HTN, DMII, and Seizures as evidenced by improved management of these chronic diseases. verbalize basic understanding of CAD, HTN, DMII, and Seizures disease process and self health management plan as evidenced by noted improvement of management of these chronic diseases. take all medications exactly as prescribed and will call provider for medication related questions as evidenced by being compliant with all medications    attend all scheduled medical appointments     demonstrate improved adherence to prescribed treatment plan for CAD, HTN, DMII, and Seizures as evidenced by overall improved management of these chronic diseases continue to work with Medical illustrator and/or Social Worker to address care management and care coordination needs related to CAD, HTN, DMII, and Seizures as evidenced by adherence to CM Team Scheduled appointments     work with pharmacist to address Financial constraints related to obtaining medications and Medication procurement related to CAD, HTN, DMII, and Seizures as evidenced by review of EMR and patient or pharmacist report    work with Child psychotherapist to address Financial constraints related to safe affordable Housing and utility payment , Housing  barriers, ADL IADL limitations, Cognitive Deficits, Memory Deficits, Inability to perform IADL's independently, and Lacks knowledge of community resource: safe and affordable Section 8 Housing, utility payment assistance and dental care providers covered by [employer] insurance related to the management of CAD, HTN, DMII, and Seizures as evidenced by review of EMR and patient or Child psychotherapist report through collaboration with Medical illustrator, provider, and care team.   Interventions: Inter-disciplinary care team collaboration (see longitudinal plan of care) Evaluation of current treatment plan related to  self management and patient's adherence to plan as established by provider BSW referral for Podiatry resources-completed Collaborated with BSW. Pharmacy referral for medication assistance-completed Collaborated with Pharmacy  CAD  (Status: Goal on Track (progressing): YES.) Long Term Goal  Assessed understanding of CAD diagnosis Medications reviewed including medications utilized in CAD treatment plan Provided education on importance of blood pressure control in management of CAD; Counseled on the importance of exercise goals with target of 150 minutes per week Reviewed Importance of taking all medications as prescribed Reviewed Importance of attending all scheduled provider appointments Assessed social determinant of health barriers;  Diabetes:  (Status: Goal on Track (progressing): YES.) Long Term Goal  Lab  Results  Component Value Date   HGBA1C=5.0 on 02/25/23     HGBA1C=5.9 on 12/21/21  HGBA1C=5.6 on 07/23/22  Assessed patient's / DPR's understanding of A1c goal: <7% Provided education to patient / DPR about basic DM disease process; Reviewed medications with patient and discussed importance of medication adherence;        Discussed plans with patient / DPR for ongoing care management follow up and provided patient with direct contact information for care management team;       Reviewed scheduled/upcoming provider appointments        Referral made to pharmacy team for assistance with complex medication regimen; [facility] Pharmacist involved in care;       Referral made to social work team for assistance with housing and utility payment assistance, dental care providers covered by Managed Medicaid; [facility] BSW involved in care;      Assessed social determinant of health barriers;        [name] denies any episodes or any signs/symptoms of Hypoglycemia or Hyperglycemia.   Seizures  (Status: Goal on Track (progressing): YES.) Long Term Goal  Evaluation of current treatment plan related to  Seizures ,  and any seizure activity,  self-management and patient's / DPR's adherence to plan as established by provider. Discussed plans with patient / DPR for ongoing care management follow up and provided patient / DPR with direct contact information for care management team Advised patient / DPR to report any seizure activity to PCP; Reviewed medications with patient and discussed importance of medication compliance; Reviewed scheduled/upcoming provider appointments  Social Work referral for Housing, utility payment and dental care providers-completed Pharmacy referral for complex medication regimen; Assessed social determinant of health barriers;  [name] reports no recent seizures.  Patient currently has all medications per patient's wife.  Hypertension: (Status: Goal on Track (progressing): YES.) Long Term Goal  Last practice recorded BP readings:  BP Readings from Last 3 Encounters:              02/25/23-110/85    07/10/22- 177/101    11/10/22-126/75  Most recent eGFR/CrCl:  Lab Results  Component Value Date   EGFR 92 06/06/2021    No components found for: CRCL  Evaluation of current treatment plan related to hypertension self management and patient's / DPR's adherence to plan as established by provider;   Reviewed prescribed diet Low Salt, Heart  Healthy Reviewed medications with patient / DPR and discussed importance of compliance  Provided assistance with obtaining home blood pressure monitor via contacting  [facility].  [facility] staff confirmed that blood pressure monitor is ready.  I requested they deliver the monitor to patient's new home.  [facility] will deliver the monitor.;  Counseled on the importance of exercise goals with target of 150 minutes per week Discussed plans with patient for ongoing care management follow up and provided patient / DPR with direct contact information for care management team; Reviewed scheduled/upcoming provider appointments Assessed social determinant of health barriers;   Patient Goals/Self-Care Activities: Take medications as prescribed   Attend all scheduled provider appointments Call pharmacy for medication refills 3-7 days in advance of running out of medications Call provider office for new concerns or questions  Work with the social worker to address care coordination needs and will continue to work with the clinical team to address health care and disease management related needs [name] to contact PCP for shower chair and PT. [name] to schedule Podiatry appointment

## 2023-03-09 DIAGNOSIS — R4189 Other symptoms and signs involving cognitive functions and awareness: Secondary | ICD-10-CM | POA: Diagnosis not present

## 2023-03-10 DIAGNOSIS — R4189 Other symptoms and signs involving cognitive functions and awareness: Secondary | ICD-10-CM | POA: Diagnosis not present

## 2023-03-14 DIAGNOSIS — R4189 Other symptoms and signs involving cognitive functions and awareness: Secondary | ICD-10-CM | POA: Diagnosis not present

## 2023-03-15 DIAGNOSIS — R4189 Other symptoms and signs involving cognitive functions and awareness: Secondary | ICD-10-CM | POA: Diagnosis not present

## 2023-03-16 DIAGNOSIS — R4189 Other symptoms and signs involving cognitive functions and awareness: Secondary | ICD-10-CM | POA: Diagnosis not present

## 2023-03-17 DIAGNOSIS — R4189 Other symptoms and signs involving cognitive functions and awareness: Secondary | ICD-10-CM | POA: Diagnosis not present

## 2023-03-18 DIAGNOSIS — R4189 Other symptoms and signs involving cognitive functions and awareness: Secondary | ICD-10-CM | POA: Diagnosis not present

## 2023-03-19 DIAGNOSIS — R4189 Other symptoms and signs involving cognitive functions and awareness: Secondary | ICD-10-CM | POA: Diagnosis not present

## 2023-03-20 DIAGNOSIS — R4189 Other symptoms and signs involving cognitive functions and awareness: Secondary | ICD-10-CM | POA: Diagnosis not present

## 2023-03-21 DIAGNOSIS — R4189 Other symptoms and signs involving cognitive functions and awareness: Secondary | ICD-10-CM | POA: Diagnosis not present

## 2023-03-22 DIAGNOSIS — R4189 Other symptoms and signs involving cognitive functions and awareness: Secondary | ICD-10-CM | POA: Diagnosis not present

## 2023-03-25 ENCOUNTER — Other Ambulatory Visit: Payer: Self-pay | Admitting: Diagnostic Neuroimaging

## 2023-03-25 ENCOUNTER — Other Ambulatory Visit: Payer: Self-pay | Admitting: Student

## 2023-03-25 DIAGNOSIS — R4189 Other symptoms and signs involving cognitive functions and awareness: Secondary | ICD-10-CM | POA: Diagnosis not present

## 2023-03-25 DIAGNOSIS — F1020 Alcohol dependence, uncomplicated: Secondary | ICD-10-CM

## 2023-03-27 DIAGNOSIS — R4189 Other symptoms and signs involving cognitive functions and awareness: Secondary | ICD-10-CM | POA: Diagnosis not present

## 2023-03-29 ENCOUNTER — Encounter: Payer: BLUE CROSS/BLUE SHIELD | Attending: Pediatrics | Primary: Family Medicine

## 2023-03-29 DIAGNOSIS — R4189 Other symptoms and signs involving cognitive functions and awareness: Secondary | ICD-10-CM | POA: Diagnosis not present

## 2023-03-29 NOTE — Telephone Encounter (Signed)
Bobby Beltran has an appointment today 11/1 and is needing a slightly earlier appointment if possible, has a urgent work meeting at 130pm - even a half hour would help.     If that is not possible he would need to reschedule     I did not see any slots available on the schedule     Call back# [phone]

## 2023-04-01 ENCOUNTER — Encounter: Payer: BLUE CROSS/BLUE SHIELD | Attending: Pediatrics | Primary: Family Medicine

## 2023-04-02 DIAGNOSIS — R4189 Other symptoms and signs involving cognitive functions and awareness: Secondary | ICD-10-CM | POA: Diagnosis not present

## 2023-04-03 DIAGNOSIS — R4189 Other symptoms and signs involving cognitive functions and awareness: Secondary | ICD-10-CM | POA: Diagnosis not present

## 2023-04-04 DIAGNOSIS — R4189 Other symptoms and signs involving cognitive functions and awareness: Secondary | ICD-10-CM | POA: Diagnosis not present

## 2023-04-05 DIAGNOSIS — R4189 Other symptoms and signs involving cognitive functions and awareness: Secondary | ICD-10-CM | POA: Diagnosis not present

## 2023-04-06 DIAGNOSIS — R4189 Other symptoms and signs involving cognitive functions and awareness: Secondary | ICD-10-CM | POA: Diagnosis not present

## 2023-04-07 DIAGNOSIS — R4189 Other symptoms and signs involving cognitive functions and awareness: Secondary | ICD-10-CM | POA: Diagnosis not present

## 2023-04-08 ENCOUNTER — Encounter: Payer: BLUE CROSS/BLUE SHIELD | Attending: Pediatrics | Primary: Family Medicine

## 2023-04-08 DIAGNOSIS — R4189 Other symptoms and signs involving cognitive functions and awareness: Secondary | ICD-10-CM | POA: Diagnosis not present

## 2023-04-10 DIAGNOSIS — R4189 Other symptoms and signs involving cognitive functions and awareness: Secondary | ICD-10-CM | POA: Diagnosis not present

## 2023-04-11 DIAGNOSIS — R4189 Other symptoms and signs involving cognitive functions and awareness: Secondary | ICD-10-CM | POA: Diagnosis not present

## 2023-04-12 DIAGNOSIS — R4189 Other symptoms and signs involving cognitive functions and awareness: Secondary | ICD-10-CM | POA: Diagnosis not present

## 2023-04-13 DIAGNOSIS — R4189 Other symptoms and signs involving cognitive functions and awareness: Secondary | ICD-10-CM | POA: Diagnosis not present

## 2023-04-14 DIAGNOSIS — R4189 Other symptoms and signs involving cognitive functions and awareness: Secondary | ICD-10-CM | POA: Diagnosis not present

## 2023-04-15 DIAGNOSIS — R4189 Other symptoms and signs involving cognitive functions and awareness: Secondary | ICD-10-CM | POA: Diagnosis not present

## 2023-04-16 DIAGNOSIS — R4189 Other symptoms and signs involving cognitive functions and awareness: Secondary | ICD-10-CM | POA: Diagnosis not present

## 2023-04-17 DIAGNOSIS — R4189 Other symptoms and signs involving cognitive functions and awareness: Secondary | ICD-10-CM | POA: Diagnosis not present

## 2023-04-18 DIAGNOSIS — R4189 Other symptoms and signs involving cognitive functions and awareness: Secondary | ICD-10-CM | POA: Diagnosis not present

## 2023-04-19 DIAGNOSIS — R4189 Other symptoms and signs involving cognitive functions and awareness: Secondary | ICD-10-CM | POA: Diagnosis not present

## 2023-04-20 DIAGNOSIS — R4189 Other symptoms and signs involving cognitive functions and awareness: Secondary | ICD-10-CM | POA: Diagnosis not present

## 2023-04-21 DIAGNOSIS — R4189 Other symptoms and signs involving cognitive functions and awareness: Secondary | ICD-10-CM | POA: Diagnosis not present

## 2023-04-22 ENCOUNTER — Other Ambulatory Visit: Payer: Self-pay | Admitting: Diagnostic Neuroimaging

## 2023-04-22 ENCOUNTER — Other Ambulatory Visit: Payer: Self-pay | Admitting: Student

## 2023-04-22 DIAGNOSIS — F1020 Alcohol dependence, uncomplicated: Secondary | ICD-10-CM

## 2023-04-22 DIAGNOSIS — R569 Unspecified convulsions: Secondary | ICD-10-CM

## 2023-04-22 NOTE — Telephone Encounter (Signed)
Pulled a voicemail from main number from [facility] stating that they are out of refills for patients Lacosamide 200 MG  . I advised that a refill for a 15 day supply was sent to the pharmacy today. The pharmacist checked and could not find that refill.   Can it please be resent to [facility] - [place], [place] - [address]

## 2023-04-23 DIAGNOSIS — R4189 Other symptoms and signs involving cognitive functions and awareness: Secondary | ICD-10-CM | POA: Diagnosis not present

## 2023-04-24 DIAGNOSIS — R4189 Other symptoms and signs involving cognitive functions and awareness: Secondary | ICD-10-CM | POA: Diagnosis not present

## 2023-04-25 DIAGNOSIS — R4189 Other symptoms and signs involving cognitive functions and awareness: Secondary | ICD-10-CM | POA: Diagnosis not present

## 2023-04-29 DIAGNOSIS — R4189 Other symptoms and signs involving cognitive functions and awareness: Secondary | ICD-10-CM | POA: Diagnosis not present

## 2023-04-30 ENCOUNTER — Ambulatory Visit: Payer: Medicaid Other | Admitting: Neurology

## 2023-05-01 DIAGNOSIS — R4189 Other symptoms and signs involving cognitive functions and awareness: Secondary | ICD-10-CM | POA: Diagnosis not present

## 2023-05-02 ENCOUNTER — Ambulatory Visit
Admit: 2023-05-02 | Discharge: 2023-05-02 | Payer: BLUE CROSS/BLUE SHIELD | Attending: Pediatrics | Primary: Family Medicine

## 2023-05-02 DIAGNOSIS — R4189 Other symptoms and signs involving cognitive functions and awareness: Secondary | ICD-10-CM | POA: Diagnosis not present

## 2023-05-02 DIAGNOSIS — Z0000 Encounter for general adult medical examination without abnormal findings: Secondary | ICD-10-CM

## 2023-05-02 DIAGNOSIS — Z125 Encounter for screening for malignant neoplasm of prostate: Secondary | ICD-10-CM

## 2023-05-02 MED ORDER — famotidine (Pepcid) 20 mg tablet
20 | ORAL_TABLET | Freq: Two times a day (BID) | ORAL | 3 refills | Status: AC
Start: 2023-05-02 — End: 2024-05-01

## 2023-05-02 NOTE — Progress Notes (Signed)
[facility]  [facility]  [address]  [address]  [place] [address]  Dept: [phone]  Dept Fax: [phone]     Patient ID: Bobby Beltran is a 62 y.o. male who presents for Annual Exam.    Subjective   HPI    Questions/Concerns/interval hx today: Here for annual well visit. Going to [facility] for a month. Doing well, working full time.     States that over the last 6-12 months, he will get central chest pain at night when laying flat. He notes that this happens very infrequently and resolves spontaneously, occurs once every few months. Does wake him up. No radiating pain. No associated sob, palpitations, diaphoresis, dizziness or syncope. Patient is very physically active and does not have any exertional symptoms. No pattern to when this occurs but may happen more with heavy meals    Wants to see a dermatologist    HCM:   - Colo: 04/2021; repeat 3 yrs  - PSA: Due today  - Derm: does not follow with routinely, sun protection   - Optho: wears readers, no other issues   - Dentist: yes, utd  - Imms (covid/flu/rsv >60, shingrix, PCV20): Due for flu shot; declines this today  - Smoking/ETOH use: see history   - Diet: pretty good   - Exercise: pretty regular, walks daily, treadmill   - Sleep: no issues  - Mood: No issues   - Social: Lives with wife and son     Review of Systems   Constitutional: Negative.  Negative for diaphoresis.   HENT: Negative.     Eyes: Negative.    Respiratory: Negative.     Cardiovascular:  Negative for palpitations.        Central chest discomfort when laying flat once every few months   Gastrointestinal: Negative.    Genitourinary: Negative.    Musculoskeletal: Negative.    Skin: Negative.    Neurological:  Negative for dizziness.   Endo/Heme/Allergies: Negative.    Psychiatric/Behavioral: Negative.           Patient Active Problem List   Diagnosis    Diverticulosis    Hx of testicular cancer     Seasonal allergies    Basal cell carcinoma (BCC) of overlapping sites of skin    Atypical chest pain    Preventative health care     Current Outpatient Medications   Medication Instructions    cholecalciferol, vitamin D3, (Vitamin D-3) 25 MCG (1000 UT) tablet Take one tab by mouth daily for bones/low vitamin D    famotidine (PEPCID) 20 mg, oral, 2 times daily    multivitamin capsule one by mouth daily for health     No Known Allergies  Past Medical History:   Diagnosis Date    Diverticulosis     Murmur     Pyloric stenosis (HHS-HCC)     childhood    Skin cancer     Testicular cancer (Multi-HCC)      Past Surgical History:   Procedure Laterality Date    COLONOSCOPY      HERNIA REPAIR  2002    INGUINAL HERNIA REPAIR      KNEE CARTILAGE SURGERY      ORCHIECTOMY Right 2001     Family History   Problem Relation Name Age of Onset    Lymphoma Mother Mom     Heart disease Father Dad     Stroke  Father Dad      Social History     Socioeconomic History    Marital status: Married     Spouse name: None    Number of children: None    Years of education: None    Highest education level: None   Occupational History    None   Tobacco Use    Smoking status: Never    Smokeless tobacco: Never   Substance and Sexual Activity    Alcohol use: Yes     Alcohol/week: 2.0 standard drinks of alcohol     Types: 2 Cans of beer per week    Drug use: Never    Sexual activity: Yes     Partners: Female     Birth control/protection: None   Other Topics Concern    None   Social History Narrative    Firefighter,   Lives with wife and 2 sons, 57 yo daughter moved out.       Social Determinants of Health     Financial Resource Strain: Low Risk  (05/02/2023)    Overall Financial Resource Strain (CARDIA)     Difficulty of Paying Living Expenses: Not hard at all   Food Insecurity: Unknown (05/02/2023)    Hunger Vital Sign     Worried About Running Out of Food in the Last Year: Never true     Ran Out of Food in the Last Year: Not on file   Transportation  Needs: No Transportation Needs (05/02/2023)    PRAPARE - Therapist, art (Medical): No     Lack of Transportation (Non-Medical): No   Physical Activity: Not on file   Stress: Not on file   Social Connections: Not on file   Intimate Partner Violence: Not on file   Housing Stability: Unknown (05/02/2023)    Housing Stability Vital Sign     Unable to Pay for Housing in the Last Year: Not on file     Number of Times Moved in the Last Year: Not on file     Homeless in the Last Year: No     Review of Systems   Constitutional: Negative.  Negative for diaphoresis.   HENT: Negative.     Eyes: Negative.    Respiratory: Negative.     Cardiovascular:  Negative for palpitations.        Central chest discomfort when laying flat once every few months   Gastrointestinal: Negative.    Genitourinary: Negative.    Musculoskeletal: Negative.    Skin: Negative.    Neurological:  Negative for dizziness.   Psychiatric/Behavioral: Negative.         Objective   Visit Vitals  BP 132/72 (BP Location: Left arm, Patient Position: Sitting, BP Cuff Size: Adult)   Pulse 87   Ht 1.753 m   Wt 76.2 kg   SpO2 97%   BMI 24.81 kg/m   BSA 1.93 m       Physical Exam  Vitals reviewed.   Constitutional:       General: He is not in acute distress.     Appearance: Normal appearance. He is normal weight.   HENT:      Head: Normocephalic and atraumatic.      Right Ear: Tympanic membrane and ear canal normal. There is no impacted cerumen.      Left Ear: Tympanic membrane and ear canal normal. There is no impacted cerumen.      Nose: Nose normal.  Mouth/Throat:      Mouth: Mucous membranes are moist.      Pharynx: Oropharynx is clear. No oropharyngeal exudate or posterior oropharyngeal erythema.   Eyes:      Extraocular Movements: Extraocular movements intact.      Conjunctiva/sclera: Conjunctivae normal.      Pupils: Pupils are equal, round, and reactive to light.   Neck:      Vascular: No carotid bruit.   Cardiovascular:      Rate  and Rhythm: Normal rate and regular rhythm.      Pulses: Normal pulses.      Heart sounds: Normal heart sounds.   Pulmonary:      Effort: Pulmonary effort is normal.      Breath sounds: Normal breath sounds.   Abdominal:      General: Abdomen is flat. Bowel sounds are normal. There is no abdominal bruit.      Palpations: Abdomen is soft. There is no mass.      Tenderness: There is no abdominal tenderness. There is no guarding.      Hernia: No hernia is present.   Musculoskeletal:         General: Normal range of motion.      Cervical back: Normal range of motion and neck supple.   Skin:     General: Skin is warm and dry.   Neurological:      General: No focal deficit present.      Mental Status: He is alert.   Psychiatric:         Mood and Affect: Mood normal.         Behavior: Behavior normal.         Assessment/Plan   Lukah was seen today for annual exam.  Preventative health care  Assessment & Plan:  Counseled on healthy habits, diet and exercise  Colonoscopy is UTD, due next year. Checking psa levels today  Discussed self testicular exams, sun protection and regular skin inspection  Goal of 150 m phys activity weekly  Utd w/ immunizations, declines flu- discussed risks  Labs today     FU in 1 year or sooner w/ any concerns.    Screening for malignant neoplasm of prostate  -     PSA Total, Screen; Future  Hyperlipidemia, unspecified hyperlipidemia type (CMS-HCC)  -     Comprehensive metabolic panel; Future  -     Lipid panel; Future  -     Hemoglobin A1c; Future  Atypical nevus  -     EXT  Dermatology Referral; Future  Encounter for screening examination for other mental health and behavioral disorders  Screening for depression  Atypical chest pain  Assessment & Plan:  Presentation most c/w gerd, especially given presence when only laying flat. No other associated cardiac features and patient is very active without exertional symptoms.     Ekg w/ NSR. Advised to start pepcid prn- take with heavy meals such as red  sauces. Patient does not wish to try this. Referral to cardiology to r/o cardiac etiology.     Advised to proceed to ER with any chest pain, pain with exertion/exercise, DOB, syncope, dizziness, lightheadedness, diaphoresis or other concerns.   Orders:  -     ECG 12 lead (Back Office)  -     famotidine (Pepcid) 20 mg tablet; Take 1 tablet (20 mg) by mouth twice daily.  -     LCO  [facility] Cardiology); Future  Basal  cell carcinoma (BCC) of overlapping sites of skin  Assessment & Plan:  Hx of basal cell, wants to establish with a dermatologist. Referral today. Discussed importance of sun protection and skin inspection      Patient Health Questionnaire-2 Score: 0   Interpretation: Negative screening.      Follow-up & Interventions: Maintain annual screening - No additional Follow-up required    Generalized Anxiety Disorder Screening - GAD-7 Score: 0   Interpretation: Negative screening.  Follow up & Intervention: Maintain annual screening - No additional Follow-up required      Generalized Anxiety Disorder Screening - GAD-7 Score: 0   Interpretation: Negative screening.  Follow up & Intervention: Maintain annual screening - No additional Follow-up required

## 2023-05-02 NOTE — Patient Instructions (Addendum)
See Dermatologist: Dr Talbert Nan in [place], [phone]; [facility] (Dr.O'Brien) several locations, [phone]; or Dr Jake Bathe, [phone]; or [facility] [phone].      Start pepcid twice daily for heartburn to see if this helps with symptoms    Schedule with cardiologist. See Cardiology:  Dr Eileen Stanford in [place], [phone]; or [facility] in [place] ([address] Ernst Breach, [place]), [phone]; or [facility] (Drs Camelia Phenes, Ishmael Holter), [phone]; or [facility] in [place] (Drs Bryson Corona, Pladziewicz), [phone].    Labs today    Cont healthy diet and exercise. Goal of 150 minutes activity weekly    Sun protection.     FU in 1 year or sooner with any concerns.   ER with any chest pain, pain with exertion/exercise, DOB, syncope, dizziness, lightheadedness, diaphoresis or other concerns.

## 2023-05-03 DIAGNOSIS — R4189 Other symptoms and signs involving cognitive functions and awareness: Secondary | ICD-10-CM | POA: Diagnosis not present

## 2023-05-03 LAB — LIPID PANEL
Cholesterol: 220 mg/dL — ABNORMAL HIGH (ref 100–199)
HDL Cholesterol: 47 mg/dL (ref >39)
LDLc Calc (NIH): 143 mg/dL — ABNORMAL HIGH (ref 0–99)
Non-HDL Chol: 173 mg/dL — ABNORMAL HIGH (ref 0–129)
Triglycerides: 165 mg/dL — ABNORMAL HIGH (ref 0–149)
VLDLc Calc: 30 mg/dL (ref 5–40)

## 2023-05-03 LAB — COMPREHENSIVE METABOLIC PANEL
ALT: 16 IU/L (ref 0–44)
AST: 21 IU/L (ref 0–40)
Albumin: 4.4 g/dL (ref 3.8–4.9)
Alk Phosphatase: 81 IU/L (ref 44–121)
Anion Gap: 13 mmol/L (ref 10.0–18.0)
BUN/Creat Ratio: 13 (ref 10–24)
BUN: 12 mg/dL (ref 8–27)
Bili Total: 0.3 mg/dL (ref 0.0–1.2)
Calcium: 9.9 mg/dL (ref 8.6–10.2)
Carbon Dioxide: 25 mmol/L (ref 20–29)
Chloride: 102 mmol/L (ref 96–106)
Creat: 0.94 mg/dL (ref 0.76–1.27)
Globulin Total: 2.6 g/dL (ref 1.5–4.5)
Glucose: 96 mg/dL (ref 70–99)
Potassium: 4.6 mmol/L (ref 3.5–5.2)
Protein Total: 7 g/dL (ref 6.0–8.5)
Sodium: 140 mmol/L (ref 134–144)
eGFR: 93 mL/min/{1.73_m2} (ref >59)

## 2023-05-03 LAB — PSA TOTAL, SCREEN: PSA TOTAL: 1.3 ng/mL (ref 0.0–4.0)

## 2023-05-03 LAB — HEMOGLOBIN A1C: HgbA1C: 5.7 % — ABNORMAL HIGH (ref 4.8–5.6)

## 2023-05-04 DIAGNOSIS — R4189 Other symptoms and signs involving cognitive functions and awareness: Secondary | ICD-10-CM | POA: Diagnosis not present

## 2023-05-05 DIAGNOSIS — R4189 Other symptoms and signs involving cognitive functions and awareness: Secondary | ICD-10-CM | POA: Diagnosis not present

## 2023-05-06 DIAGNOSIS — R4189 Other symptoms and signs involving cognitive functions and awareness: Secondary | ICD-10-CM | POA: Diagnosis not present

## 2023-05-06 DIAGNOSIS — Z0000 Encounter for general adult medical examination without abnormal findings: Secondary | ICD-10-CM

## 2023-05-06 NOTE — Assessment & Plan Note (Signed)
Presentation most c/w gerd, especially given presence when only laying flat. No other associated cardiac features and patient is very active without exertional symptoms.     Ekg w/ NSR. Advised to start pepcid prn- take with heavy meals such as red sauces. Patient does not wish to try this. Referral to cardiology to r/o cardiac etiology.     Advised to proceed to ER with any chest pain, pain with exertion/exercise, DOB, syncope, dizziness, lightheadedness, diaphoresis or other concerns.

## 2023-05-06 NOTE — Assessment & Plan Note (Signed)
Counseled on healthy habits, diet and exercise  Colonoscopy is UTD, due next year. Checking psa levels today  Discussed self testicular exams, sun protection and regular skin inspection  Goal of 150 m phys activity weekly  Utd w/ immunizations, declines flu- discussed risks  Labs today     FU in 1 year or sooner w/ any concerns.

## 2023-05-06 NOTE — Assessment & Plan Note (Signed)
Hx of basal cell, wants to establish with a dermatologist. Referral today. Discussed importance of sun protection and skin inspection

## 2023-05-07 DIAGNOSIS — R4189 Other symptoms and signs involving cognitive functions and awareness: Secondary | ICD-10-CM | POA: Diagnosis not present

## 2023-05-08 ENCOUNTER — Ambulatory Visit: Payer: Medicaid Other | Admitting: Obstetrics and Gynecology

## 2023-05-08 DIAGNOSIS — R4189 Other symptoms and signs involving cognitive functions and awareness: Secondary | ICD-10-CM | POA: Diagnosis not present

## 2023-05-09 DIAGNOSIS — R4189 Other symptoms and signs involving cognitive functions and awareness: Secondary | ICD-10-CM | POA: Diagnosis not present

## 2023-05-10 ENCOUNTER — Ambulatory Visit: Admit: 2023-05-10 | Payer: BLUE CROSS/BLUE SHIELD | Attending: Registered Nurse | Primary: Family Medicine

## 2023-05-10 DIAGNOSIS — R4189 Other symptoms and signs involving cognitive functions and awareness: Secondary | ICD-10-CM | POA: Diagnosis not present

## 2023-05-10 DIAGNOSIS — R0789 Other chest pain: Secondary | ICD-10-CM

## 2023-05-10 NOTE — Progress Notes (Signed)
OUTPATIENT H&P  Dondra Spry, MD [facility]  [address], [address]  [place] [address]  [phone]    Today's Date: 06/10/2023  MRN: [id number]  Name: Bobby Beltran  DOB: Oct 01, 1960    Chief Complaint  Chief Complaint   Patient presents with    Chest Pain                  History Of Present Illness  Bobby Beltran is a 62 y.o. male presents for new patient cardiac consult for evaluation of atypical chest pain.    He reports a number of episodes of chest discomfort over past year or so. Most often occurs in the middle of the night, perhaps once every 2-3 months, lasts for 20-30 minutes. Most recently this discomfort awoken him from sleep at 2 am. Pain in center of chest, more on right side. No associated symptoms, such as  SOB, diaphoresis, palpations, nausea or vomiting. No belching.   Does not smoker, has never smoked, father had CAD in advance age (CABG at 11). Chest pain is no exertional. He  exercises frequently and has never had an issue with that.   ASCVD Risk is 11.1%%  His is traveling to [place] on Jan 25th    Past Medical History  He has a past medical history of Diverticulosis, Murmur, Pyloric stenosis (HHS-HCC), Skin cancer, and Testicular cancer (Multi-HCC).    Patient Active Problem List   Diagnosis    Diverticulosis    Hx of testicular cancer    Seasonal allergies    Basal cell carcinoma (BCC) of overlapping sites of skin    Atypical chest pain    Preventative health care     Surgical History  He has a past surgical history that includes Orchiectomy (Right, 2001); Colonoscopy; Knee cartilage surgery; Inguinal hernia repair; and Hernia repair (2002).       Allergies  Patient has no known allergies.     Medications    Current Outpatient Medications:     multivitamin capsule, one by mouth daily for health, Disp: , Rfl:     cholecalciferol, vitamin D3, (Vitamin D-3) 25 MCG (1000 UT) tablet, Take one tab by mouth daily for bones/low vitamin D, Disp: , Rfl:     famotidine (Pepcid) 20 mg tablet, Take 1  tablet (20 mg) by mouth twice daily., Disp: 180 tablet, Rfl: 3    rosuvastatin (Crestor) 5 mg tablet, Take 1 tablet (5 mg) by mouth once daily., Disp: 90 tablet, Rfl: 3    Medications       None            Review of Systems  Rest of the review of systems was negative.     Physical Exam  Constitutional:       Appearance: Normal appearance.   Cardiovascular:      Rate and Rhythm: Normal rate and regular rhythm.      Pulses: Normal pulses.      Heart sounds: Normal heart sounds.   Pulmonary:      Effort: Pulmonary effort is normal.      Breath sounds: Normal breath sounds.   Neurological:      Mental Status: He is alert.             Last Recorded Vitals  Blood pressure 138/86, pulse 83, weight 77.6 kg, SpO2 98%.    Relevant Results  Office Visit on 05/02/2023   Component Date Value    PSA TOTAL 05/02/2023 1.3  Glucose 05/02/2023 96     BUN 05/02/2023 12     Creat 05/02/2023 0.94     eGFR 05/02/2023 93     BUN/Creat Ratio 05/02/2023 13     Sodium 05/02/2023 140     Potassium 05/02/2023 4.6     Chloride 05/02/2023 102     Anion Gap 05/02/2023 13.0     Carbon Dioxide 05/02/2023 25     Calcium 05/02/2023 9.9     Protein Total 05/02/2023 7.0     Albumin 05/02/2023 4.4     Globulin Total 05/02/2023 2.6     Bili Total 05/02/2023 0.3     Alk Phosphatase 05/02/2023 81     AST 05/02/2023 21     ALT 05/02/2023 16     Cholesterol 05/02/2023 220 (H)     Triglycerides 05/02/2023 165 (H)     HDL Cholesterol 05/02/2023 47     VLDLc Calc 05/02/2023 30     LDLc Calc (NIH) 05/02/2023 143 (H)     Non-HDL Chol 05/02/2023 173 (H)     HgbA1C 05/02/2023 5.7 (H)               Assessment/Plan   Problem List Items Addressed This Visit       Atypical chest pain - Primary    Relevant Orders    TTE Complete - Transthoracic Echo Complete (Completed)    Stress Test Only (Completed)     Other Visit Diagnoses       Prediabetes        Relevant Orders    TTE Complete - Transthoracic Echo Complete (Completed)    Stress Test Only (Completed)     Prehypertension        Relevant Orders    TTE Complete - Transthoracic Echo Complete (Completed)    Stress Test Only (Completed)          62 year old male with + FH of CAD presenting for atypical chest pain. Discussed risk of CAD and will obtain echocardiogram as well as stress testing. Advised to monitor symptoms closely and if pain re-occurs, present to ED. We discussed preDM as well as elevated blood pressure as well. Diet, exercise and weight loss strongly advised for weight reduction. All questions answered to the best of my ability. Pt understands and agrees with plan

## 2023-05-11 DIAGNOSIS — R4189 Other symptoms and signs involving cognitive functions and awareness: Secondary | ICD-10-CM | POA: Diagnosis not present

## 2023-05-13 ENCOUNTER — Other Ambulatory Visit: Payer: Self-pay | Admitting: Neurology

## 2023-05-13 DIAGNOSIS — R4189 Other symptoms and signs involving cognitive functions and awareness: Secondary | ICD-10-CM | POA: Diagnosis not present

## 2023-05-14 DIAGNOSIS — R4189 Other symptoms and signs involving cognitive functions and awareness: Secondary | ICD-10-CM | POA: Diagnosis not present

## 2023-05-15 DIAGNOSIS — R4189 Other symptoms and signs involving cognitive functions and awareness: Secondary | ICD-10-CM | POA: Diagnosis not present

## 2023-05-16 DIAGNOSIS — R4189 Other symptoms and signs involving cognitive functions and awareness: Secondary | ICD-10-CM | POA: Diagnosis not present

## 2023-05-17 ENCOUNTER — Other Ambulatory Visit: Payer: Self-pay | Admitting: Obstetrics and Gynecology

## 2023-05-17 DIAGNOSIS — R4189 Other symptoms and signs involving cognitive functions and awareness: Secondary | ICD-10-CM | POA: Diagnosis not present

## 2023-05-17 NOTE — Patient Outreach (Signed)
Medicaid Managed Care   Nurse Care Manager Note  05/17/2023 Name:  Arthur [name] MRN:  [id number] DOB:  06-12-1960  Arthur [name] is an 62 y.o. year old male who is a primary patient of Modena Slater, DO.  The [facility] Managed Care Coordination team was consulted for assistance with:    Chronic healthcare management needs, tobacco use, h/o ETOH abuse, h/o seizures and syncope, HLD, CAD, DM  Arthur [name] was given information about Medicaid Managed Care Coordination team services today. Arthur [name] Patient agreed to services and verbal consent obtained.  Engaged with patient by telephone for follow up visit in response to provider referral for case management and/or care coordination services.   Patient is participating in a Managed Medicaid Plan:  Yes  Assessments/Interventions:  Review of past medical history, allergies, medications, health status, including review of consultants reports, laboratory and other test data, was performed as part of comprehensive evaluation and provision of chronic care management services.  SDOH (Social Drivers of Health) assessments and interventions performed: SDOH Interventions    Flowsheet Row Patient Outreach Telephone from 05/17/2023 in [place] POPULATION HEALTH DEPARTMENT Patient Outreach Telephone from 03/08/2023 in [place] POPULATION HEALTH DEPARTMENT Patient Outreach Telephone from 01/07/2023 in [place] POPULATION HEALTH DEPARTMENT Patient Outreach Telephone from 12/07/2022 in [place] POPULATION HEALTH DEPARTMENT Patient Outreach Telephone from 10/08/2022 in [place] POPULATION HEALTH DEPARTMENT Patient Outreach Telephone from 08/21/2022 in  POPULATION HEALTH DEPARTMENT  SDOH Interventions        Food Insecurity Interventions -- Other (Comment)  [has food resources, declines further resources at this time] -- -- Intervention Not Indicated --  Housing Interventions -- -- -- -- Intervention Not Indicated --  Transportation  Interventions -- -- Intervention Not Indicated -- -- --  Utilities Interventions Intervention Not Indicated -- -- -- -- --  Alcohol Usage Interventions -- Intervention Not Indicated (Score <7) -- -- -- --  Financial Strain Interventions Intervention Not Indicated -- -- -- -- --  Physical Activity Interventions -- -- -- -- -- Other (Comments)  [patient deconditioned, balance issues per DPR, patient's wife to f/u with provider]  Stress Interventions -- -- Intervention Not Indicated -- -- --  Health Literacy Interventions -- -- -- Intervention Not Indicated -- --     Care Plan Allergies  Allergen Reactions   Penicillins Hives    Did it involve swelling of the face/tongue/throat, SOB, or low BP? Y Did it involve sudden or severe rash/hives, skin peeling, or any reaction on the inside of your mouth or nose? N Did you need to seek medical attention at a hospital or doctor's office? Y When did it last happen?  Over 5 Years Ago     If all above answers are "NO", may proceed with cephalosporin use.    Medications Reviewed Today     Reviewed by Danie Chandler, RN (Registered Nurse) on 05/17/23 at 1141  Med List Status: <None>   Medication Order Taking? Sig Documenting Provider Last Dose Status Informant  amLODipine (NORVASC) 10 MG tablet [id number]  Take 1 tablet (10 mg total) by mouth daily. Elder [name], MD  Active   aspirin EC 81 MG tablet [id number] No Take 1 tablet (81 mg total) by mouth daily. Swallow whole. [name], [name], PA-C Taking Active   aspirin EC 81 MG tablet [id number] No Take 1 tablet (81 mg total) by mouth daily. Swallow whole. Patwardhan, Anabel Bene, MD Taking Active   atorvastatin (LIPITOR) 40 MG tablet  [id number]  Take 1 tablet (40 mg total) by mouth daily. Elder [name], MD  Active   Blood Pressure Monitor KIT [id number] No Use to check blood pressure dailly. [name], MD Taking Active   cholecalciferol (VITAMIN D) 25 MCG (1000 UNIT) tablet [id number] No Take  by mouth.  Taking Active   empagliflozin (JARDIANCE) 10 MG TABS tablet [id number]  Take 1 tablet (10 mg total) by mouth daily before breakfast. Patwardhan, Anabel Bene, MD  Active   hydrALAZINE (APRESOLINE) 50 MG tablet [id number]  Take 1 tablet (50 mg total) by mouth every 8 (eight) hours. Patwardhan, Anabel Bene, MD  Active   isosorbide dinitrate (ISORDIL) 30 MG tablet [id number]  Take 1 tablet (30 mg total) by mouth 3 (three) times daily. Patwardhan, Anabel Bene, MD  Active   lacosamide (VIMPAT) 200 MG TABS tablet [id number]  TAKE 1 TABLET BY MOUTH 2 (TWO) TIMES DAILY.(AM+PM) Sater, Pearletha Furl, MD  Active   levETIRAcetam (KEPPRA) 750 MG tablet [id number]  TAKE 2 TABLETS BY MOUTH 2 (TWO) TIMES DAILY.(2AM+2PM) Penumalli, Vikram R, MD  Active   metoprolol succinate (TOPROL-XL) 100 MG 24 hr tablet [id number]  Take 0.5 tablets (50 mg total) by mouth daily. Take with or immediately following a meal. Patwardhan, Manish J, MD  Active   naltrexone (DEPADE) 50 MG tablet [id number]  TAKE 1 TABLET (50 MG TOTAL) BY MOUTH DAILY (AM) Modena Slater, DO  Active   sacubitril-valsartan (ENTRESTO) 49-51 MG [id number]  Take 1 tablet by mouth 2 (two) times daily. Start on 06/29/2021 Elder [name], MD  Active   Med List Note [name], CPhT 02/24/21 1756): [place]: "12/08/20 receiving PAP through [employer], approved until 12/08/2022"           Patient Active Problem List   Diagnosis Date Noted   Poor appetite 02/25/2023   Substance abuse (HCC) 02/25/2023   Healthcare maintenance 07/11/2022   Tobacco use disorder 07/11/2022   Pain due to onychomycosis of toenails of both feet 07/05/2022   Porokeratosis 07/05/2022   Vitamin D deficiency 09/07/2021   Cognitive impairment 09/06/2021   Coronary artery disease    HFrEF (heart failure with reduced ejection fraction) (HCC) 05/10/2021   Seizures (HCC) 02/24/2021   Aortic atherosclerosis (HCC) 11/22/2020   History of seizures 11/22/2020   Alcohol use disorder, severe,  dependence (HCC) 11/22/2020   Controlled type 2 diabetes mellitus with microalbuminuria, without long-term current use of insulin (HCC) 11/22/2020   Anemia 11/22/2020   Hyperlipidemia 11/22/2020   Essential hypertension 10/08/2019   History of CVA (cerebrovascular accident) 10/02/2019   Conditions to be addressed/monitored per PCP order:  Chronic healthcare management needs, tobacco use, h/o ETOH abuse, h/o seizures and syncope, HLD, CAD, DM  Care Plan : RN Care Manager Plan of Care  Updates made by Danie Chandler, RN since 05/17/2023 12:00 AM     Problem: Chronic Disease Management and Care Coordination Needs for DMII, HTN, CAD, Seizures   Priority: High     Long-Range Goal: Development of Plan of Care for Chronic Disease Management and Care Coordination Needs (CAD, HTN, DMII, Seizures)   Start Date: 08/08/2021  Expected End Date: 08/15/2023  Priority: High  Note:   Current Barriers:  Knowledge Deficits related to plan of care for management of CAD, HTN, DMII, and Seizures, tobacco use, HLD, h/o CVA Care Coordination needs related to Financial constraints related to affordable safe housing and utility payment, Housing barriers, Medication procurement, ADL IADL limitations, Literacy concerns, Cognitive Deficits, Memory  Deficits, Inability to perform IADL's independently, and Lacks knowledge of community resource: safe & affordable Section 8 Housing, utility payment assistance and dental care providers covered by [employer]  Chronic Disease Management support and education needs related to CAD, HTN, DMII, and Seizures, tobacco use, HLD, h/o CVA Corporate treasurer.  Non-adherence to prescribed medication regimen Difficulty obtaining medications Cognitive Deficits No Advanced Directives in place Falls - Increased Potential for Falls 05/17/23:  No issues today-to contact PCP office for medication refills.  No available blood sugar readings today-Metformin has been  stopped.  No reported seizure activity or syncopal episodes.    RNCM Clinical Goal(s):  Patient/ DPR  will verbalize understanding of plan for management of CAD, HTN, DMII, and Seizures as evidenced by improved management of these chronic diseases. verbalize basic understanding of CAD, HTN, DMII, and Seizures disease process and self health management plan as evidenced by noted improvement of management of these chronic diseases. take all medications exactly as prescribed and will call provider for medication related questions as evidenced by being compliant with all medications    attend all scheduled medical appointments     demonstrate improved adherence to prescribed treatment plan for CAD, HTN, DMII, and Seizures as evidenced by overall improved management of these chronic diseases continue to work with Medical illustrator and/or Social Worker to address care management and care coordination needs related to CAD, HTN, DMII, and Seizures as evidenced by adherence to CM Team Scheduled appointments     work with pharmacist to address Financial constraints related to obtaining medications and Medication procurement related to CAD, HTN, DMII, and Seizures as evidenced by review of EMR and patient or pharmacist report    work with Child psychotherapist to address Financial constraints related to safe affordable Housing and utility payment , Housing barriers, ADL IADL limitations, Cognitive Deficits, Memory Deficits, Inability to perform IADL's independently, and Lacks knowledge of community resource: safe and affordable Section 8 Housing, utility payment assistance and dental care providers covered by [employer] insurance related to the management of CAD, HTN, DMII, and Seizures as evidenced by review of EMR and patient or Child psychotherapist report through collaboration with Medical illustrator, provider, and care team.   Interventions: Inter-disciplinary care team collaboration (see longitudinal plan of  care) Evaluation of current treatment plan related to  self management and patient's adherence to plan as established by provider BSW referral for Podiatry resources-completed Collaborated with BSW. Pharmacy referral for medication assistance-completed Collaborated with Pharmacy  CAD  (Status: Goal on Track (progressing): YES.) Long Term Goal  Assessed understanding of CAD diagnosis Medications reviewed including medications utilized in CAD treatment plan Provided education on importance of blood pressure control in management of CAD; Counseled on the importance of exercise goals with target of 150 minutes per week Reviewed Importance of taking all medications as prescribed Reviewed Importance of attending all scheduled provider appointments Assessed social determinant of health barriers;  Diabetes:  (Status: Goal on Track (progressing): YES.) Long Term Goal  Lab Results  Component Value Date   HGBA1C=5.0 on 02/25/23     HGBA1C=5.9 on 12/21/21  HGBA1C=5.6 on 07/23/22  Assessed patient's / DPR's understanding of A1c goal: <7% Provided education to patient / DPR about basic DM disease process; Reviewed medications with patient and discussed importance of medication adherence;        Discussed plans with patient / DPR for ongoing care management follow up and provided patient with direct contact information for care management team;  Reviewed scheduled/upcoming provider appointments        Referral made to pharmacy team for assistance with complex medication regimen; [facility] Pharmacist involved in care;       Referral made to social work team for assistance with housing and utility payment assistance, dental care providers covered by Managed Medicaid; [employer] BSW involved in care;      Assessed social determinant of health barriers;        [name] denies any episodes or any signs/symptoms of Hypoglycemia or Hyperglycemia.   Seizures  (Status: Goal on Track (progressing): YES.) Long Term  Goal  Evaluation of current treatment plan related to  Seizures ,  and any seizure activity,  self-management and patient's / DPR's adherence to plan as established by provider. Discussed plans with patient / DPR for ongoing care management follow up and provided patient / DPR with direct contact information for care management team Advised patient / DPR to report any seizure activity to PCP; Reviewed medications with patient and discussed importance of medication compliance; Reviewed scheduled/upcoming provider appointments  Social Work referral for Housing, utility payment and dental care providers-completed Pharmacy referral for complex medication regimen; Assessed social determinant of health barriers;  [name] reports no recent seizures.  Patient currently has all medications per patient's wife.  Hypertension: (Status: Goal on Track (progressing): YES.) Long Term Goal  Last practice recorded BP readings:  BP Readings from Last 3 Encounters:              02/25/23-110/85    07/10/22- 177/101    11/10/22-126/75  Most recent eGFR/CrCl:  Lab Results  Component Value Date   EGFR 92 06/06/2021    No components found for: CRCL  Evaluation of current treatment plan related to hypertension self management and patient's / DPR's adherence to plan as established by provider;   Reviewed prescribed diet Low Salt, Heart Healthy Reviewed medications with patient / DPR and discussed importance of compliance  Provided assistance with obtaining home blood pressure monitor via contacting  [facility].  [facility] staff confirmed that blood pressure monitor is ready.  I requested they deliver the monitor to patient's new home.  [facility] will deliver the monitor.;  Counseled on the importance of exercise goals with target of 150 minutes per week Discussed plans with patient for ongoing care management follow up and provided patient / DPR with direct contact information for care  management team; Reviewed scheduled/upcoming provider appointments Assessed social determinant of health barriers;   Patient Goals/Self-Care Activities: Take medications as prescribed   Attend all scheduled provider appointments Call pharmacy for medication refills 3-7 days in advance of running out of medications Call provider office for new concerns or questions  Work with the social worker to address care coordination needs and will continue to work with the clinical team to address health care and disease management related needs [name] to contact PCP for shower chair and PT. [name] to schedule Podiatry appointment  05/17/23:  patient's wife to contact PCP office for medication refill.   Long-Range Goal: Establish Plan of Care for Chronic Disease Management Needs   Priority: High  Note:   Timeframe:  Long-Range Goal Priority:  High Start Date:    12/05/21                         Expected End Date:  ongoing  Follow Up Date 07/18/23   - schedule appointment for vaccines needed due to my age or health - schedule recommended health tests - schedule and keep appointment for annual check-up    Why is this important?   Screening tests can find diseases early when they are easier to treat.  Your doctor or nurse will talk with you about which tests are important for you.  Getting shots for common diseases like the flu and shingles will help prevent them.   05/17/23:  CARDS appt in March   Follow Up:  Patient agrees to Care Plan and Follow-up.  Plan: The Managed Medicaid care management team will reach out to the patient again over the next 60 business  days. and The  Patient has been provided with contact information for the Managed Medicaid care management team and has been advised to call with any health related questions or concerns.  Date/time of next scheduled RN care management/care coordination outreach: 07/18/23 at 0900.

## 2023-05-17 NOTE — Patient Instructions (Signed)
Visit Information  Arthur [name] / DPR was given information about Medicaid Managed Care team care coordination services as a part of their [facility] Community Plan Medicaid benefit. Arthur [name] /. DPR verbally consented to engagement with the [facility] Managed Care team.   If you are experiencing a medical emergency, please call 911 or report to your local emergency department or urgent care.   If you have a non-emergency medical problem during routine business hours, please contact your provider's office and ask to speak with a nurse.   For questions related to your [facility], please call: [phone] or visit the homepage here: [email or web address]  If you would like to schedule transportation through your [facility], please call the following number at least 2 days in advance of your appointment: [phone]   Rides for urgent appointments can also be made after hours by calling Member Services.  Call the Behavioral Health Crisis Line at [phone], at any time, 24 hours a day, 7 days a week. If you are in danger or need immediate medical attention call 911.  If you would like help to quit smoking, call 1-800-QUIT-NOW (786-608-7023) OR Espaol: 1-855-Djelo-Ya (9-371-696-7893) o para ms informacin haga clic aqu or Text READY to 810-175 to register via text  Arthur [name] / DPR - following are the goals we discussed in your visit today:   Goals Addressed    Timeframe:  Long-Range Goal Priority:  High Start Date:    12/05/21                         Expected End Date:  ongoing                     Follow Up Date 07/18/23   - schedule appointment for vaccines needed due to my age or health - schedule recommended health tests - schedule and keep appointment for annual check-up    Why is this important?   Screening tests can find diseases early when they are easier to treat.   Your doctor or nurse will talk with you about which tests are important for you.  Getting shots for common diseases like the flu and shingles will help prevent them.   05/17/23:  CARDS appt in March  Patient / DPR verbalizes understanding of instructions and care plan provided today and agrees to view in MyChart. Active MyChart status and patient / DPR understanding of how to access instructions and care plan via MyChart confirmed with patient/DPR.  The Managed Medicaid care management team will reach out to the patient / DPR again over the next 60 business  days.  The  [name] has been provided with contact information for the Managed Medicaid care management team and has been advised to call with any health related questions or concerns.   Kathi Der RN, BSN [place]  [facility] Care Management Coordinator - Managed Medicaid High Risk [phone]   Following is a copy of your plan of care:  Care Plan : RN Care Manager Plan of Care  Updates made by Danie Chandler, RN since 05/17/2023 12:00 AM     Problem: Chronic Disease Management and Care Coordination Needs for DMII, HTN, CAD, Seizures   Priority: High     Long-Range Goal: Development of Plan of Care for Chronic Disease Management and Care Coordination Needs (CAD, HTN, DMII, Seizures)   Start Date: 08/08/2021  Expected End Date: 08/15/2023  Priority: High  Note:   Current Barriers:  Knowledge Deficits related to plan of care for management of CAD, HTN, DMII, and Seizures, tobacco use, HLD, h/o CVA Care Coordination needs related to Financial constraints related to affordable safe housing and utility payment, Housing barriers, Medication procurement, ADL IADL limitations, Literacy concerns, Cognitive Deficits, Memory Deficits, Inability to perform IADL's independently, and Lacks knowledge of community resource: safe & affordable Section 8 Housing, utility payment assistance and dental care  providers covered by [employer]  Chronic Disease Management support and education needs related to CAD, HTN, DMII, and Seizures, tobacco use, HLD, h/o CVA Corporate treasurer.  Non-adherence to prescribed medication regimen Difficulty obtaining medications Cognitive Deficits No Advanced Directives in place Falls - Increased Potential for Falls 05/17/23:  No issues today-to contact PCP office for medication refills.  No available blood sugar readings today-Metformin has been stopped.  No reported seizure activity or syncopal episodes.    RNCM Clinical Goal(s):  Patient/ DPR  will verbalize understanding of plan for management of CAD, HTN, DMII, and Seizures as evidenced by improved management of these chronic diseases. verbalize basic understanding of CAD, HTN, DMII, and Seizures disease process and self health management plan as evidenced by noted improvement of management of these chronic diseases. take all medications exactly as prescribed and will call provider for medication related questions as evidenced by being compliant with all medications    attend all scheduled medical appointments     demonstrate improved adherence to prescribed treatment plan for CAD, HTN, DMII, and Seizures as evidenced by overall improved management of these chronic diseases continue to work with Medical illustrator and/or Social Worker to address care management and care coordination needs related to CAD, HTN, DMII, and Seizures as evidenced by adherence to CM Team Scheduled appointments     work with pharmacist to address Financial constraints related to obtaining medications and Medication procurement related to CAD, HTN, DMII, and Seizures as evidenced by review of EMR and patient or pharmacist report    work with Child psychotherapist to address Financial constraints related to safe affordable Housing and utility payment , Housing barriers, ADL IADL limitations, Cognitive Deficits, Memory Deficits,  Inability to perform IADL's independently, and Lacks knowledge of community resource: safe and affordable Section 8 Housing, utility payment assistance and dental care providers covered by [employer] insurance related to the management of CAD, HTN, DMII, and Seizures as evidenced by review of EMR and patient or Child psychotherapist report through collaboration with Medical illustrator, provider, and care team.   Interventions: Inter-disciplinary care team collaboration (see longitudinal plan of care) Evaluation of current treatment plan related to  self management and patient's adherence to plan as established by provider BSW referral for Podiatry resources-completed Collaborated with BSW. Pharmacy referral for medication assistance-completed Collaborated with Pharmacy  CAD  (Status: Goal on Track (progressing): YES.) Long Term Goal  Assessed understanding of CAD diagnosis Medications reviewed including medications utilized in CAD treatment plan Provided education on importance of blood pressure control in management of CAD; Counseled on the importance of exercise goals with target of 150 minutes per week Reviewed Importance of taking all medications as prescribed Reviewed Importance of attending all scheduled provider appointments Assessed social determinant of health barriers;  Diabetes:  (Status: Goal on Track (progressing): YES.) Long Term Goal  Lab Results  Component Value Date   HGBA1C=5.0 on 02/25/23     HGBA1C=5.9 on 12/21/21  HGBA1C=5.6 on 07/23/22  Assessed patient's / DPR's understanding of A1c goal: <7% Provided education to patient / DPR about basic DM disease process; Reviewed medications with patient and discussed importance of medication adherence;        Discussed plans with patient / DPR for ongoing care management follow up and provided patient with direct contact information for care management team;      Reviewed scheduled/upcoming provider appointments        Referral  made to pharmacy team for assistance with complex medication regimen; [facility] Pharmacist involved in care;       Referral made to social work team for assistance with housing and utility payment assistance, dental care providers covered by Managed Medicaid; [facility] BSW involved in care;      Assessed social determinant of health barriers;        [name] denies any episodes or any signs/symptoms of Hypoglycemia or Hyperglycemia.   Seizures  (Status: Goal on Track (progressing): YES.) Long Term Goal  Evaluation of current treatment plan related to  Seizures ,  and any seizure activity,  self-management and patient's / DPR's adherence to plan as established by provider. Discussed plans with patient / DPR for ongoing care management follow up and provided patient / DPR with direct contact information for care management team Advised patient / DPR to report any seizure activity to PCP; Reviewed medications with patient and discussed importance of medication compliance; Reviewed scheduled/upcoming provider appointments  Social Work referral for Housing, utility payment and dental care providers-completed Pharmacy referral for complex medication regimen; Assessed social determinant of health barriers;  [name] reports no recent seizures.  Patient currently has all medications per patient's wife.  Hypertension: (Status: Goal on Track (progressing): YES.) Long Term Goal  Last practice recorded BP readings:  BP Readings from Last 3 Encounters:              02/25/23-110/85    07/10/22- 177/101    11/10/22-126/75  Most recent eGFR/CrCl:  Lab Results  Component Value Date   EGFR 92 06/06/2021    No components found for: CRCL  Evaluation of current treatment plan related to hypertension self management and patient's / DPR's adherence to plan as established by provider;   Reviewed prescribed diet Low Salt, Heart Healthy Reviewed medications with patient / DPR and discussed importance of  compliance  Provided assistance with obtaining home blood pressure monitor via contacting  [facility].  [facility] staff confirmed that blood pressure monitor is ready.  I requested they deliver the monitor to patient's new home.  [facility] will deliver the monitor.;  Counseled on the importance of exercise goals with target of 150 minutes per week Discussed plans with patient for ongoing care management follow up and provided patient / DPR with direct contact information for care management team; Reviewed scheduled/upcoming provider appointments Assessed social determinant of health barriers;   Patient Goals/Self-Care Activities: Take medications as prescribed   Attend all scheduled provider appointments Call pharmacy for medication refills 3-7 days in advance of running out of medications Call provider office for new concerns or questions  Work with the social worker to address care coordination needs and will continue to work with the clinical team to address health care and disease management related needs [name] to contact PCP for shower chair and PT. [name] to schedule Podiatry appointment  05/17/23:  patient's wife to contact PCP office for medication refill.      Long-Range Goal: Establish Plan of Care for Chronic  Disease Management Needs   Priority: High  Note:   Timeframe:  Long-Range Goal Priority:  High Start Date:    12/05/21                         Expected End Date:  ongoing                     Follow Up Date 07/18/23   - schedule appointment for vaccines needed due to my age or health - schedule recommended health tests - schedule and keep appointment for annual check-up    Why is this important?   Screening tests can find diseases early when they are easier to treat.  Your doctor or nurse will talk with you about which tests are important for you.  Getting shots for common diseases like the flu and shingles will help prevent them.    05/17/23:  CARDS appt in March

## 2023-05-18 DIAGNOSIS — R4189 Other symptoms and signs involving cognitive functions and awareness: Secondary | ICD-10-CM | POA: Diagnosis not present

## 2023-05-19 DIAGNOSIS — R4189 Other symptoms and signs involving cognitive functions and awareness: Secondary | ICD-10-CM | POA: Diagnosis not present

## 2023-05-20 ENCOUNTER — Telehealth: Payer: Self-pay | Admitting: Cardiology

## 2023-05-20 DIAGNOSIS — R4189 Other symptoms and signs involving cognitive functions and awareness: Secondary | ICD-10-CM | POA: Diagnosis not present

## 2023-05-20 NOTE — Telephone Encounter (Signed)
*  STAT* If patient is at the pharmacy, call can be transferred to refill team.   1. Which medications need to be refilled? (please list name of each medication and dose if known) amLODipine (NORVASC) 10 MG tablet   atorvastatin (LIPITOR) 40 MG tablet   hydrALAZINE (APRESOLINE) 50 MG tablet   isosorbide dinitrate (ISORDIL) 30 MG tablet   metoprolol succinate (TOPROL-XL) 100 MG 24 hr tablet   sacubitril-valsartan (ENTRESTO) 49-51 MG   2. Which pharmacy/location (including street and city if local pharmacy) is medication to be sent to? [facility] - [place], [place] - [address]   3. Do they need a 30 day or 90 day supply? 90

## 2023-05-22 DIAGNOSIS — R4189 Other symptoms and signs involving cognitive functions and awareness: Secondary | ICD-10-CM | POA: Diagnosis not present

## 2023-05-23 DIAGNOSIS — R4189 Other symptoms and signs involving cognitive functions and awareness: Secondary | ICD-10-CM | POA: Diagnosis not present

## 2023-05-24 DIAGNOSIS — R4189 Other symptoms and signs involving cognitive functions and awareness: Secondary | ICD-10-CM | POA: Diagnosis not present

## 2023-05-25 DIAGNOSIS — R4189 Other symptoms and signs involving cognitive functions and awareness: Secondary | ICD-10-CM | POA: Diagnosis not present

## 2023-05-26 DIAGNOSIS — R4189 Other symptoms and signs involving cognitive functions and awareness: Secondary | ICD-10-CM | POA: Diagnosis not present

## 2023-05-27 ENCOUNTER — Ambulatory Visit: Admit: 2023-05-27 | Payer: BLUE CROSS/BLUE SHIELD | Attending: Cardiovascular Disease | Primary: Family Medicine

## 2023-05-27 ENCOUNTER — Other Ambulatory Visit: Payer: Self-pay | Admitting: Cardiology

## 2023-05-27 ENCOUNTER — Other Ambulatory Visit: Payer: Self-pay | Admitting: Student

## 2023-05-27 ENCOUNTER — Other Ambulatory Visit: Payer: Self-pay | Admitting: Diagnostic Neuroimaging

## 2023-05-27 DIAGNOSIS — I10 Essential (primary) hypertension: Secondary | ICD-10-CM

## 2023-05-27 DIAGNOSIS — R569 Unspecified convulsions: Secondary | ICD-10-CM

## 2023-05-27 DIAGNOSIS — F1020 Alcohol dependence, uncomplicated: Secondary | ICD-10-CM

## 2023-05-27 DIAGNOSIS — R0789 Other chest pain: Secondary | ICD-10-CM

## 2023-05-27 NOTE — Telephone Encounter (Signed)
Called and spoke with the patients wife, she stated she will have the patients nurse give [place] a call back to schedule a appointment with [place].

## 2023-05-27 NOTE — Procedures (Signed)
Salil K. Eileen Stanford, MD. [facility]   Cardiology & Internal Medicine  [address], [address]  [place], [place].  [address]  Telephone [phone]  Fax [phone]    EXERCISE TESTING  DATE: 05/27/2023            PATIENT NAME:  Bobby Beltran   DOB:  1960-11-18  PCP: Dr. Gwenlyn Perking      41.  62 year old man referred for evaluation of  atypical chest pain, pre-diabetes, pre-HTN.    2. Physical examination/Risk Factors:  hyperlipidemia, pre-diabetes     3. Resting BP:  110/70  Resting pulse:  70 bpm    4. Medications: multivitamin     5. Electrocardiogram showed: NSR. WNL    6. Patient was exercised on the treadmill following the Advanced Bruce protocol.    7. Normal to increased exercise tolerance.  Exercise was terminated at 9'00" Stage V because of leg fatigue.  Blood pressure response was good   and the peak heart rate was 146 (91% of expected for age and sex).    8. There were no arrhythmias.     9. Test is negative by ST criteria.    Workload was equal to 14.8 METS.    Recommendation: Statin therapy          -----------------------------------------  Salil K. Eileen Stanford, MD, [facility]

## 2023-05-28 DIAGNOSIS — R4189 Other symptoms and signs involving cognitive functions and awareness: Secondary | ICD-10-CM | POA: Diagnosis not present

## 2023-05-29 DIAGNOSIS — R4189 Other symptoms and signs involving cognitive functions and awareness: Secondary | ICD-10-CM | POA: Diagnosis not present

## 2023-05-30 DIAGNOSIS — R4189 Other symptoms and signs involving cognitive functions and awareness: Secondary | ICD-10-CM | POA: Diagnosis not present

## 2023-06-01 DIAGNOSIS — R4189 Other symptoms and signs involving cognitive functions and awareness: Secondary | ICD-10-CM | POA: Diagnosis not present

## 2023-06-03 DIAGNOSIS — R4189 Other symptoms and signs involving cognitive functions and awareness: Secondary | ICD-10-CM | POA: Diagnosis not present

## 2023-06-04 ENCOUNTER — Ambulatory Visit: Admit: 2023-06-04 | Payer: BLUE CROSS/BLUE SHIELD | Attending: Cardiovascular Disease | Primary: Family Medicine

## 2023-06-04 DIAGNOSIS — R4189 Other symptoms and signs involving cognitive functions and awareness: Secondary | ICD-10-CM | POA: Diagnosis not present

## 2023-06-04 DIAGNOSIS — R0789 Other chest pain: Secondary | ICD-10-CM

## 2023-06-04 MED ORDER — rosuvastatin (Crestor) 5 mg tablet
5 | ORAL_TABLET | Freq: Every day | ORAL | 3 refills | Status: AC
Start: 2023-06-04 — End: 2024-06-03

## 2023-06-04 NOTE — Procedures (Signed)
2-D / Doppler Echocardiogram Report    An [facility] approved Laboratory       Name Bobby Beltran, Bobby Beltran   Birthdate [birth year]   Age 63   Gender Male   Weight 76.2 kg (168.0 lbs)   Height 175.3 cm (5 ft 9.0 in)   BSA 1.92 m   BP 110/70      Date 06/04/2023   Patient Id [id number]   Referring  Haynes Bast, MD   Cardiologist Hassel Neth. Midha MD, [facility]   Sonographer Richard Miu, RDCS   Referral Reasons CP            Conclusion:   1. The left ventricle size is normal. Left ventricular wall thickness is normal. Overall left ventricular systolic function is normal with, an EF between 55 - 60 %. Normal diastolic function with normal LA pressure. No regional wall motion abnormalites detected.     2. The LA volume is normal measuring 17.74ml/m    3. The aortic valve is minimally thickened. The aortic valve appears tricuspid. There is no evidence of aortic stenosis. There is no evidence of aortic regurgitation.    4. The mitral valve leaflets appear mildly thickened. No prolapse or stenosis detected. Mild mitral annular calcification present. There is trace mitral regurgitation.    5. There is no evidence of pulmonary hypertension.    6. There is no evidence for a pericardial effusion.    7. The aortic root, ascending aorta and aortic arch are normal.    8. No previous study for comparison.             Echo Dimensions    LVOT Diam 2.1 cm    IVSd 0.9 cm    LVIDd 4.3 cm    LVPWd 1.0 cm    Ao Diam 3.2 cm    LVIDs 2.6 cm    LA Diam 3.4 cm    Ao asc 3.5 cm    RVIDd 3.3 cm    IVC 1.7 cm    LAESV Index (A-L) 17.44 ml/m            Diastolic LV Function    MV DecT 221 ms   MV E/A Ratio 0.84    E' 0.08 m/s   E/E' 7.39            LV Volumes and Fx              DOPPLER FINDINGS       Aortic Valve    LVOT Vmax 1.15 m/s           Mitral Valve    MV E Vel 0.56 m/s   MV DecT 221 ms   MV Dec Slope 2.5 m/s   MV A Vel 0.67 m/s   MV E/A Ratio 0.84            Tricuspid Valve    TR maxPG 25.05 mmHg   RAP 5.00 mmHg   RVSP 30.05 mmHg           Pulmonic  Valve    PV Vmax 1.06 m/s             Findings    ECG rhythm: Sinus rhythm.    Study quality: This was a technically adequate study.    Left Ventricle: The left ventricle size is normal. Left ventricular wall thickness is normal. Overall left ventricular systolic function is normal with, an EF between 55 - 60 %. Normal diastolic function with normal LA pressure. No  regional wall motion abnormalites detected.     Left Atrium: The LA volume is normal measuring 17.84ml/m The left atrial size is normal.    Right Ventricle: The right ventricle appears normal in chamber size and systolic function.    Right Atrium: The right atrial size is normal.    Aortic Valve: The aortic valve is minimally thickened. The aortic valve appears tricuspid. There is no evidence of aortic stenosis. There is no evidence of aortic regurgitation.    Mitral Valve: The mitral valve leaflets appear mildly thickened. No prolapse or stenosis detected.  Mild mitral annular calcification present. There is trace mitral regurgitation.    Tricuspid Valve: The tricuspid valve appears structurally normal. No stenosis or prolapse detected.  Mild tricuspid regurgitation present. There is no evidence of pulmonary hypertension.    Pulmonic Valve: Pulmonic valve structure appeared normal. No stenosis detected.  Trace pulmonic regurgitation.    Pericardium: There is no evidence for a pericardial effusion.    Aorta: The aortic root, ascending aorta and aortic arch are normal.    IVC/Hepatic Veins: The inferior vena cava is normal in size with normal respirophasic changes.    Septum: The atrial and ventricular septae appear intact with color flow interrogation.    General comments: No previous study for comparison.        Electronically signed off by:  Hassel Neth. Midha MD, [facility]   06/04/2023

## 2023-06-05 DIAGNOSIS — R4189 Other symptoms and signs involving cognitive functions and awareness: Secondary | ICD-10-CM | POA: Diagnosis not present

## 2023-06-06 DIAGNOSIS — R4189 Other symptoms and signs involving cognitive functions and awareness: Secondary | ICD-10-CM | POA: Diagnosis not present

## 2023-06-07 DIAGNOSIS — R4189 Other symptoms and signs involving cognitive functions and awareness: Secondary | ICD-10-CM | POA: Diagnosis not present

## 2023-06-09 DIAGNOSIS — R4189 Other symptoms and signs involving cognitive functions and awareness: Secondary | ICD-10-CM | POA: Diagnosis not present

## 2023-06-12 DIAGNOSIS — R4189 Other symptoms and signs involving cognitive functions and awareness: Secondary | ICD-10-CM | POA: Diagnosis not present

## 2023-06-13 DIAGNOSIS — R4189 Other symptoms and signs involving cognitive functions and awareness: Secondary | ICD-10-CM | POA: Diagnosis not present

## 2023-06-14 DIAGNOSIS — R4189 Other symptoms and signs involving cognitive functions and awareness: Secondary | ICD-10-CM | POA: Diagnosis not present

## 2023-06-15 DIAGNOSIS — R4189 Other symptoms and signs involving cognitive functions and awareness: Secondary | ICD-10-CM | POA: Diagnosis not present

## 2023-06-16 DIAGNOSIS — R4189 Other symptoms and signs involving cognitive functions and awareness: Secondary | ICD-10-CM | POA: Diagnosis not present

## 2023-06-17 DIAGNOSIS — R4189 Other symptoms and signs involving cognitive functions and awareness: Secondary | ICD-10-CM | POA: Diagnosis not present

## 2023-06-18 DIAGNOSIS — R4189 Other symptoms and signs involving cognitive functions and awareness: Secondary | ICD-10-CM | POA: Diagnosis not present

## 2023-06-20 DIAGNOSIS — R4189 Other symptoms and signs involving cognitive functions and awareness: Secondary | ICD-10-CM | POA: Diagnosis not present

## 2023-06-21 DIAGNOSIS — R4189 Other symptoms and signs involving cognitive functions and awareness: Secondary | ICD-10-CM | POA: Diagnosis not present

## 2023-06-22 DIAGNOSIS — R4189 Other symptoms and signs involving cognitive functions and awareness: Secondary | ICD-10-CM | POA: Diagnosis not present

## 2023-07-01 DIAGNOSIS — R4189 Other symptoms and signs involving cognitive functions and awareness: Secondary | ICD-10-CM | POA: Diagnosis not present

## 2023-07-02 DIAGNOSIS — R4189 Other symptoms and signs involving cognitive functions and awareness: Secondary | ICD-10-CM | POA: Diagnosis not present

## 2023-07-03 DIAGNOSIS — R4189 Other symptoms and signs involving cognitive functions and awareness: Secondary | ICD-10-CM | POA: Diagnosis not present

## 2023-07-04 DIAGNOSIS — R4189 Other symptoms and signs involving cognitive functions and awareness: Secondary | ICD-10-CM | POA: Diagnosis not present

## 2023-07-05 DIAGNOSIS — R4189 Other symptoms and signs involving cognitive functions and awareness: Secondary | ICD-10-CM | POA: Diagnosis not present

## 2023-07-07 DIAGNOSIS — R4189 Other symptoms and signs involving cognitive functions and awareness: Secondary | ICD-10-CM | POA: Diagnosis not present

## 2023-07-09 ENCOUNTER — Encounter: Payer: Medicaid Other | Admitting: Internal Medicine

## 2023-07-10 DIAGNOSIS — R4189 Other symptoms and signs involving cognitive functions and awareness: Secondary | ICD-10-CM | POA: Diagnosis not present

## 2023-07-11 DIAGNOSIS — R4189 Other symptoms and signs involving cognitive functions and awareness: Secondary | ICD-10-CM | POA: Diagnosis not present

## 2023-07-12 DIAGNOSIS — R4189 Other symptoms and signs involving cognitive functions and awareness: Secondary | ICD-10-CM | POA: Diagnosis not present

## 2023-07-17 ENCOUNTER — Other Ambulatory Visit: Payer: Self-pay

## 2023-07-17 ENCOUNTER — Ambulatory Visit: Payer: Medicaid Other | Admitting: Internal Medicine

## 2023-07-17 VITALS — BP 116/64 | HR 58 | Temp 97.7°F | Ht 62.0 in | Wt 134.6 lb

## 2023-07-17 DIAGNOSIS — F329 Major depressive disorder, single episode, unspecified: Secondary | ICD-10-CM

## 2023-07-17 DIAGNOSIS — Z8673 Personal history of transient ischemic attack (TIA), and cerebral infarction without residual deficits: Secondary | ICD-10-CM | POA: Diagnosis not present

## 2023-07-17 DIAGNOSIS — Z7984 Long term (current) use of oral hypoglycemic drugs: Secondary | ICD-10-CM | POA: Diagnosis not present

## 2023-07-17 DIAGNOSIS — E875 Hyperkalemia: Secondary | ICD-10-CM | POA: Diagnosis not present

## 2023-07-17 DIAGNOSIS — Z23 Encounter for immunization: Secondary | ICD-10-CM

## 2023-07-17 DIAGNOSIS — E1129 Type 2 diabetes mellitus with other diabetic kidney complication: Secondary | ICD-10-CM | POA: Diagnosis not present

## 2023-07-17 DIAGNOSIS — F321 Major depressive disorder, single episode, moderate: Secondary | ICD-10-CM

## 2023-07-17 DIAGNOSIS — Z0000 Encounter for general adult medical examination without abnormal findings: Secondary | ICD-10-CM

## 2023-07-17 DIAGNOSIS — F1020 Alcohol dependence, uncomplicated: Secondary | ICD-10-CM | POA: Diagnosis not present

## 2023-07-17 DIAGNOSIS — E119 Type 2 diabetes mellitus without complications: Secondary | ICD-10-CM

## 2023-07-17 LAB — POCT GLYCOSYLATED HEMOGLOBIN (HGB A1C): Hemoglobin A1C: 5.8 % — AB (ref 4.0–5.6)

## 2023-07-17 LAB — GLUCOSE, CAPILLARY: Glucose-Capillary: 148 mg/dL — ABNORMAL HIGH (ref 70–99)

## 2023-07-17 MED ORDER — SERTRALINE HCL 25 MG PO TABS
25.0000 mg | ORAL_TABLET | Freq: Every day | ORAL | 2 refills | Status: DC
Start: 2023-07-17 — End: 2023-08-28

## 2023-07-17 NOTE — Assessment & Plan Note (Addendum)
The patient has major depressive disorder, with a PHQ9 score of 15 today. The patient has been dealing with depression for years, since the passing of his daughter and father a few years ago, and also ever since his stroke that has left him disabled. He uses alcohol as a coping mechanism for his depression, and understands the significant risks associated with this. We discussed starting a medication today to help with his depression, to see if that could also help him cut down on drinking.   Plan:  - Start zoloft 25 mg daily (would be careful not to escalate the dose too much in the future with the risk of reducing the seizure threshold, but zoloft is the preferred agent in this case/lowest risk of reducing seizure threshold) - Follow up in 6 weeks

## 2023-07-17 NOTE — Assessment & Plan Note (Addendum)
A1c 5.8% on jardiance 10 mg daily. Was previously on metformin, but this was discontinued due to his A1c being well controlled.

## 2023-07-17 NOTE — Assessment & Plan Note (Signed)
Last K value was noted to be 5.8. Recheck with BMP today.

## 2023-07-17 NOTE — Progress Notes (Signed)
CC: alcohol use disorder  HPI:  [name] is a 63 y.o. male living with a history stated below and presents today for a follow up of his alcohol use disorder. He is accompanied by his caregiver. Please see problem based assessment and plan for additional details.  Past Medical History:  Diagnosis Date   DM2 (diabetes mellitus, type 2) (HCC)    ETOH abuse    Hypertension    Seizures (HCC)    Transaminitis     Current Outpatient Medications on File Prior to Visit  Medication Sig Dispense Refill   amLODipine (NORVASC) 10 MG tablet Take 1 tablet (10 mg total) by mouth daily. 90 tablet 3   aspirin EC 81 MG tablet Take 1 tablet (81 mg total) by mouth daily. Swallow whole. 90 tablet 3   aspirin EC 81 MG tablet Take 1 tablet (81 mg total) by mouth daily. Swallow whole. 90 tablet 3   atorvastatin (LIPITOR) 40 MG tablet Take 1 tablet (40 mg total) by mouth daily. 90 tablet 3   Blood Pressure Monitor KIT Use to check blood pressure dailly. 1 kit 0   cholecalciferol (VITAMIN D) 25 MCG (1000 UNIT) tablet Take by mouth. 90 tablet 3   empagliflozin (JARDIANCE) 10 MG TABS tablet Take 1 tablet (10 mg total) by mouth daily before breakfast. 90 tablet 3   hydrALAZINE (APRESOLINE) 50 MG tablet Take 1 tablet (50 mg total) by mouth every 8 (eight) hours. 270 tablet 3   isosorbide dinitrate (ISORDIL) 30 MG tablet Take 1 tablet (30 mg total) by mouth 3 (three) times daily. 270 tablet 3   lacosamide (VIMPAT) 200 MG TABS tablet TAKE 1 TABLET BY MOUTH 2 (TWO) TIMES DAILY.(AM+PM) 60 tablet 5   levETIRAcetam (KEPPRA) 750 MG tablet TAKE 2 TABLETS BY MOUTH 2 (TWO) TIMES DAILY.(2AM+2PM) 120 tablet 0   metoprolol succinate (TOPROL-XL) 100 MG 24 hr tablet Take 0.5 tablets (50 mg total) by mouth daily. Take with or immediately following a meal. 90 tablet 3   naltrexone (DEPADE) 50 MG tablet TAKE 1 TABLET (50 MG TOTAL) BY MOUTH DAILY (AM) 30 tablet 0   sacubitril-valsartan (ENTRESTO) 49-51 MG Take 1 tablet by  mouth 2 (two) times daily. Start on 06/29/2021 180 tablet 3   No current facility-administered medications on file prior to visit.    Family History  Problem Relation Age of Onset   Diabetes Mother    Hypertension Mother    Diabetes Father    Hypertension Father    Hypertension Sister     Social History   Socioeconomic History   Marital status: Married    Spouse name: [name]   Number of children: 6   Years of education: Not on file   Highest education level: Not on file  Occupational History   Not on file  Tobacco Use   Smoking status: Some Days    Types: Cigars   Smokeless tobacco: Never   Tobacco comments:    Smokes black & mild - 2 cigars a day.  Vaping Use   Vaping status: Never Used  Substance and Sexual Activity   Alcohol use: Not Currently   Drug use: Never   Sexual activity: Not on file  Other Topics Concern   Not on file  Social History Narrative   Right handed   Lives at home with wife    2-3 cups of cadd per day    Social Drivers of Health   Financial Resource Strain: Low Risk  (  05/17/2023)   Overall Financial Resource Strain (CARDIA)    Difficulty of Paying Living Expenses: Not very hard  Food Insecurity: Food Insecurity Present (03/08/2023)   Hunger Vital Sign    Worried About Running Out of Food in the Last Year: Often true    Ran Out of Food in the Last Year: Often true  Transportation Needs: No Transportation Needs (01/07/2023)   PRAPARE - Administrator, Civil Service (Medical): No    Lack of Transportation (Non-Medical): No  Physical Activity: Insufficiently Active (08/21/2022)   Exercise Vital Sign    Days of Exercise per Week: 7 days    Minutes of Exercise per Session: 10 min  Stress: No Stress Concern Present (01/07/2023)   [employer] of Occupational Health - Occupational Stress Questionnaire    Feeling of Stress : Not at all  Social Connections: Socially Integrated (08/21/2022)   Social Connection and Isolation Panel  [NHANES]    Frequency of Communication with Friends and Family: More than three times a week    Frequency of Social Gatherings with Friends and Family: More than three times a week    Attends Religious Services: More than 4 times per year    Active Member of [employer] or Organizations: Yes    Attends Engineer, structural: More than 4 times per year    Marital Status: Married  Catering manager Violence: Not At Risk (12/07/2022)   Humiliation, Afraid, Rape, and Kick questionnaire    Fear of Current or Ex-Partner: No    Emotionally Abused: No    Physically Abused: No    Sexually Abused: No    Review of Systems: ROS negative except for what is noted on the assessment and plan.  Vitals:   07/17/23 1028 07/17/23 1034 07/17/23 1045  BP: (!) 102/54 108/70 116/64  Pulse: (!) 59 68 (!) 58  Temp: 97.7 F (36.5 C)    TempSrc: Oral    SpO2: 100%    Weight: 134 lb 9.6 oz (61.1 kg)    Height: 5\' 2"  (1.575 m)      Physical Exam: Constitutional: no acute distress Cardiovascular: regular rate and rhythm Pulmonary/Chest: normal work of breathing on room air, lungs clear to auscultation bilaterally MSK: normal bulk and tone Neurological: alert & oriented x 3 Skin: warm and dry Psych: depressed mood and affect  Assessment & Plan:   Patient discussed with Dr. Oswaldo Done  Alcohol use disorder, severe, dependence (HCC) The patient presents for a follow up of his alcohol use disorder. He is accompanied by his caregiver, who states that the patient continues to drink alcohol, despite taking naltrexone. She is not sure exactly how much he is drinking, as he hides this from her and from his wife. The patient states that he feels like the naltrexone does not help to reduce his cravings for alcohol.   We discussed how alcohol puts him at significant risk for seizures, especially with his history of having a seizure previously. He understands this. He believes he uses alcohol as a coping mechanism  for his underlying depression (see A&P for depression).  Plan: - Continue naltrexone 50 mg daily  Controlled type 2 diabetes mellitus with microalbuminuria, without long-term current use of insulin (HCC) A1c 5.8% on jardiance 10 mg daily. Was previously on metformin, but this was discontinued due to his A1c being well controlled.   MDD (major depressive disorder) The patient has major depressive disorder, with a PHQ9 score of 15 today. The patient has been  dealing with depression for years, since the passing of his daughter and father a few years ago, and also ever since his stroke that has left him disabled. He uses alcohol as a coping mechanism for his depression, and understands the significant risks associated with this. We discussed starting a medication today to help with his depression, to see if that could also help him cut down on drinking.   Plan:  - Start zoloft 25 mg daily (would be careful not to escalate the dose too much in the future with the risk of reducing the seizure threshold, but zoloft is the preferred agent in this case/lowest risk of reducing seizure threshold) - Follow up in 6 weeks  History of CVA (cerebrovascular accident) The patient has a history of a previous cerebral infarction in 2022 that left the patient disabled. He is mostly dependent of his ADLs, and has required the assistance of a caregiver/personal care services. Will renew PCS form today, as he is disabled and unable to care for himself on his own.   Hyperkalemia Last K value was noted to be 5.8. Recheck with BMP today.   Healthcare maintenance - Flu shot given today   Betzayda Braxton, D.O. [facility] Internal Medicine, PGY-3 Phone: [phone] Date 07/17/2023 Time 3:23 PM

## 2023-07-17 NOTE — Assessment & Plan Note (Signed)
The patient has a history of a previous cerebral infarction in 2022 that left the patient disabled. He is mostly dependent of his ADLs, and has required the assistance of a caregiver/personal care services. Will renew PCS form today, as he is disabled and unable to care for himself on his own.

## 2023-07-17 NOTE — Patient Instructions (Signed)
Thank you, [name] for allowing [place] to provide your care today. Today we discussed:  Alcohol use Keep taking naltrexone - this will reduce the alcohol cravings, and even if you are still drinking, the hope is it will help you drink less than before Alcohol can reduce your appetite - try to drink Boost/Ensure drinks to get protein in  Diabetes/high blood pressure No changes to your medicines today  We are checking your kidney function and electrolytes  Depression Start zoloft 25 mg daily (low dose)  Follow up in ~6 weeks and we will see if your depression is improved any, and if we need to adjust this dose  I have ordered the following labs for you:   Lab Orders         BMP8+Anion Gap         Glucose, capillary         Microalbumin / Creatinine Urine Ratio         POC Hbg A1C       Referrals ordered today:   Referral Orders  No referral(s) requested today     I have ordered the following medication/changed the following medications:   Stop the following medications: There are no discontinued medications.   Start the following medications: Meds ordered this encounter  Medications   sertraline (ZOLOFT) 25 MG tablet    Sig: Take 1 tablet (25 mg total) by mouth daily.    Dispense:  30 tablet    Refill:  2     Follow up:  6-8 weeks    Should you have any questions or concerns please call the internal medicine clinic at [phone].     Elza Rafter, D.O. [facility]

## 2023-07-17 NOTE — Assessment & Plan Note (Signed)
 Flu shot given today

## 2023-07-17 NOTE — Assessment & Plan Note (Signed)
The patient presents for a follow up of his alcohol use disorder. He is accompanied by his caregiver, who states that the patient continues to drink alcohol, despite taking naltrexone. She is not sure exactly how much he is drinking, as he hides this from her and from his wife. The patient states that he feels like the naltrexone does not help to reduce his cravings for alcohol.   We discussed how alcohol puts him at significant risk for seizures, especially with his history of having a seizure previously. He understands this. He believes he uses alcohol as a coping mechanism for his underlying depression (see A&P for depression).  Plan: - Continue naltrexone 50 mg daily

## 2023-07-18 ENCOUNTER — Other Ambulatory Visit: Payer: Self-pay | Admitting: Obstetrics and Gynecology

## 2023-07-18 ENCOUNTER — Telehealth: Payer: Self-pay | Admitting: *Deleted

## 2023-07-18 DIAGNOSIS — R4189 Other symptoms and signs involving cognitive functions and awareness: Secondary | ICD-10-CM | POA: Diagnosis not present

## 2023-07-18 LAB — MICROALBUMIN / CREATININE URINE RATIO
Creatinine, Urine: 413.3 mg/dL
Microalb/Creat Ratio: 39 mg/g{creat} — ABNORMAL HIGH (ref 0–29)
Microalbumin, Urine: 161.8 ug/mL

## 2023-07-18 NOTE — Patient Instructions (Signed)
Visit Information  Arthur Arthur [name] was given information about Medicaid Managed Care team care coordination services as a part of their [facility] Community Plan Medicaid benefit. Arthur Arthur [name]/ Arthur [name]  verbally consented to engagement with the [facility] Managed Care team.   If you are experiencing a medical emergency, please call 911 or report to your local emergency department or urgent care.   If you have a non-emergency medical problem during routine business hours, please contact your provider's office and ask to speak with a nurse.   For questions related to your [facility], please call: [phone] or visit the homepage here: [email or web address]  If you would like to schedule transportation through your [facility], please call the following number at least 2 days in advance of your appointment: [phone]   Rides for urgent appointments can also be made after hours by calling Member Services.  Call the Behavioral Health Crisis Line at [phone], at any time, 24 hours a day, 7 days a week. If you are in danger or need immediate medical attention call 911.  If you would like help to quit smoking, call 1-800-QUIT-NOW (774-830-0381) OR Espaol: 1-855-Djelo-Ya (6-440-347-4259) o para ms informacin haga clic aqu or Text READY to 563-875 to register via text  Arthur Arthur [name] / Arthur [name] - following are the goals we discussed in your visit today:   Goals Addressed    Timeframe:  Long-Range Goal Priority:  High Start Date:    12/05/21                         Expected End Date:  ongoing                     Follow Up Date 09/13/23   - schedule appointment for vaccines needed due to my age or health - schedule recommended health tests - schedule and keep appointment for annual check-up    Why is this important?   Screening tests can find diseases early when they are easier to treat.   Your doctor or nurse will talk with you about which tests are important for you.  Getting shots for common diseases like the flu and shingles will help prevent them.   07/18/23: Seen by PCP yesterday, CARDS appt in March and PCP f/u again in April  Patient/ Arthur [name]  verbalizes understanding of instructions and care plan provided today and agrees to view in MyChart. Active MyChart status and patient/ Arthur [name]  understanding of how to access instructions and care plan via MyChart confirmed with patient/Arthur [name].    The Managed Medicaid care management team will reach out to the patient/ Arthur [name]  again over the next 60 business  days.  The  Patient / Arthur [name]  has been provided with contact information for the Managed Medicaid care management team and has been advised to call with any health related questions or concerns.   Kathi Der RN, BSN, [employer] [facility] RN Care Manager Direct Dial [phone]/CZY [phone] Website: [email or web address]   Following is a copy of your plan of care:  Care Plan : RN Care Manager Plan of Care  Updates made by Danie Chandler, RN since 07/18/2023 12:00 AM     Problem: Chronic Disease Management and Care Coordination Needs for DMII, HTN, CAD, Seizures   Priority: High     Long-Range Goal: Development of Plan of Care for Chronic Disease Management and Care Coordination Needs (  CAD, HTN, DMII, Seizures)   Start Date: 08/08/2021  Expected End Date: 10/15/2023  Priority: High  Note:   Current Barriers:  Knowledge Deficits related to plan of care for management of CAD, HTN, DMII, and Seizures, tobacco use, HLD, h/o CVA Chronic Disease Management support and education needs related to CAD, HTN, DMII, and Seizures, tobacco use, HLD, h/o CVA Cognitive Deficits 07/18/23:  Seen by PCP yesterday, BP stable.  Started on Zoloft by PCP for depression-drinks ETOH  d/t depression per patient.  Has h/o seizures-no activity.  Smokes 3 cigars a day  No available blood  sugar readings.  No issues/needs today per patient's wife, Arthur [name].  RNCM Clinical Goal(s):  Patient/ Arthur [name]  will verbalize understanding of plan for management of CAD, HTN, DMII, and Seizures as evidenced by improved management of these chronic diseases. verbalize basic understanding of CAD, HTN, DMII, and Seizures disease process and self health management plan as evidenced by noted improvement of management of these chronic diseases. take all medications exactly as prescribed and will call provider for medication related questions as evidenced by being compliant with all medications    attend all scheduled medical appointments     demonstrate improved adherence to prescribed treatment plan for CAD, HTN, DMII, and Seizures as evidenced by overall improved management of these chronic diseases continue to work with Medical illustrator and/or Social Worker to address care management and care coordination needs related to CAD, HTN, DMII, and Seizures as evidenced by adherence to CM Team Scheduled appointments     work with pharmacist to address Financial constraints related to obtaining medications and Medication procurement related to CAD, HTN, DMII, and Seizures as evidenced by review of EMR and patient or pharmacist report    work with Child psychotherapist to address Financial constraints related to safe affordable Housing and utility payment , Housing barriers, ADL IADL limitations, Cognitive Deficits, Memory Deficits, Inability to perform IADL's independently, and Lacks knowledge of community resource: safe and affordable Section 8 Housing, utility payment assistance and dental care providers covered by [employer] insurance related to the management of CAD, HTN, DMII, and Seizures as evidenced by review of EMR and patient or Child psychotherapist report through collaboration with Medical illustrator, provider, and care team.   Interventions: Inter-disciplinary care team collaboration (see longitudinal plan of  care) Evaluation of current treatment plan related to  self management and patient's adherence to plan as established by provider BSW referral for Podiatry resources-completed Collaborated with BSW. Pharmacy referral for medication assistance-completed Collaborated with Pharmacy  CAD  (Status: Goal on Track (progressing): YES.) Long Term Goal  Assessed understanding of CAD diagnosis Medications reviewed including medications utilized in CAD treatment plan Provided education on importance of blood pressure control in management of CAD; Counseled on the importance of exercise goals with target of 150 minutes per week Reviewed Importance of taking all medications as prescribed Reviewed Importance of attending all scheduled provider appointments Assessed social determinant of health barriers;  Diabetes:  (Status: Goal on Track (progressing): YES.) Long Term Goal  Lab Results  Component Value Date   HGBA1C=5.0 on 02/25/23     HGBA1C=5.9 on 12/21/21  HGBA1C=5.8 on 07/17/23  Assessed patient's / Arthur [name]'s understanding of A1c goal: <7% Provided education to patient / Arthur [name] about basic DM disease process; Reviewed medications with patient and discussed importance of medication adherence;        Discussed plans with patient / Arthur [name] for ongoing care management follow up and provided patient with direct  contact information for care management team;      Reviewed scheduled/upcoming provider appointments        Referral made to pharmacy team for assistance with complex medication regimen; [employer] Pharmacist involved in care;       Referral made to social work team for assistance with housing and utility payment assistance, dental care providers covered by Managed Medicaid; [facility] BSW involved in care;      Assessed social determinant of health barriers;        [name] denies any episodes or any signs/symptoms of Hypoglycemia or Hyperglycemia.   Seizures  (Status: Goal on Track (progressing): YES.) Long Term  Goal  Evaluation of current treatment plan related to  Seizures ,  and any seizure activity,  self-management and patient's / Arthur [name]'s adherence to plan as established by provider. Discussed plans with patient / Arthur [name] for ongoing care management follow up and provided patient / Arthur [name] with direct contact information for care management team Advised patient / Arthur [name] to report any seizure activity to PCP; Reviewed medications with patient and discussed importance of medication compliance; Reviewed scheduled/upcoming provider appointments  Social Work referral for Housing, utility payment and dental care providers-completed Pharmacy referral for complex medication regimen; Assessed social determinant of health barriers;  [name] reports no recent seizures.  Patient currently has all medications per patient's wife.  Hypertension: (Status: Goal on Track (progressing): YES.) Long Term Goal  Last practice recorded BP readings:  BP Readings from Last 3 Encounters:              02/25/23-110/85    0220/25-116/64    11/10/22-126/75  Most recent eGFR/CrCl:  Lab Results  Component Value Date   EGFR 92 06/06/2021    No components found for: CRCL  Evaluation of current treatment plan related to hypertension self management and patient's / Arthur [name]'s adherence to plan as established by provider;   Reviewed prescribed diet Low Salt, Heart Healthy Reviewed medications with patient / Arthur [name] and discussed importance of compliance  Provided assistance with obtaining home blood pressure monitor via contacting  [facility].  [facility] staff confirmed that blood pressure monitor is ready.  I requested they deliver the monitor to patient's new home.  [facility] will deliver the monitor.;  Counseled on the importance of exercise goals with target of 150 minutes per week Discussed plans with patient for ongoing care management follow up and provided patient / Arthur [name] with direct contact information for care  management team; Reviewed scheduled/upcoming provider appointments Assessed social determinant of health barriers;   Patient Goals/Self-Care Activities: Take medications as prescribed   Attend all scheduled provider appointments Call pharmacy for medication refills 3-7 days in advance of running out of medications Call provider office for new concerns or questions  Work with the social worker to address care coordination needs and will continue to work with the clinical team to address health care and disease management related needs

## 2023-07-18 NOTE — Patient Outreach (Addendum)
Medicaid Managed Care   Nurse Care Manager Note  07/18/2023 Name:  Arthur [name] MRN:  [id number] DOB:  1961/04/08  Arthur [name] is an 63 y.o. year old male who is a primary patient of Modena Slater, DO.  The [facility] Managed Care Coordination team was consulted for assistance with:    Chronic healthcare management needs, tobacco use, h/o ETOH abuse, h/o seizures, HLD, CAD, h/o syncope, DM  Arthur [name] was given information about Medicaid Managed Care Coordination team services today. [name] Patient agreed to services and verbal consent obtained.  Engaged with patient by telephone for follow up visit in response to provider referral for case management and/or care coordination services.   Patient is participating in a Managed Medicaid Plan:  Yes  Assessments/Interventions:  Review of past medical history, allergies, medications, health status, including review of consultants reports, laboratory and other test data, was performed as part of comprehensive evaluation and provision of chronic care management services.  SDOH (Social Drivers of Health) assessments and interventions performed: SDOH Interventions    Flowsheet Row Patient Outreach Telephone from 07/18/2023 in [place] [facility] POPULATION HEALTH DEPARTMENT Patient Outreach Telephone from 05/17/2023 in [place] POPULATION HEALTH DEPARTMENT Patient Outreach Telephone from 03/08/2023 in [place] POPULATION HEALTH DEPARTMENT Patient Outreach Telephone from 01/07/2023 in [place] POPULATION HEALTH DEPARTMENT Patient Outreach Telephone from 12/07/2022 in [place] POPULATION HEALTH DEPARTMENT Patient Outreach Telephone from 10/08/2022 in  POPULATION HEALTH DEPARTMENT  SDOH Interventions        Food Insecurity Interventions -- -- Other (Comment)  [has food resources, declines further resources at this time] -- -- Intervention Not Indicated  Housing Interventions Intervention Not Indicated -- -- -- -- Intervention Not Indicated   Transportation Interventions -- -- -- Intervention Not Indicated -- --  Utilities Interventions -- Intervention Not Indicated -- -- -- --  Alcohol Usage Interventions -- -- Intervention Not Indicated (Score <7) -- -- --  Financial Strain Interventions -- Intervention Not Indicated -- -- -- --  Stress Interventions -- -- -- Intervention Not Indicated -- --  Social Connections Interventions Intervention Not Indicated -- -- -- -- --  Health Literacy Interventions -- -- -- -- Intervention Not Indicated --     Care Plan Allergies  Allergen Reactions   Penicillins Hives    Did it involve swelling of the face/tongue/throat, SOB, or low BP? Y Did it involve sudden or severe rash/hives, skin peeling, or any reaction on the inside of your mouth or nose? N Did you need to seek medical attention at a hospital or doctor's office? Y When did it last happen?  Over 5 Years Ago     If all above answers are "NO", may proceed with cephalosporin use.     Medications Reviewed Today     Reviewed by Danie Chandler, RN (Registered Nurse) on 07/18/23 at [phone]  Med List Status: <None>   Medication Order Taking? Sig Documenting Provider Last Dose Status Informant  amLODipine (NORVASC) 10 MG tablet [id number]  Take 1 tablet (10 mg total) by mouth daily. Elder [name], MD  Active   aspirin EC 81 MG tablet [id number] No Take 1 tablet (81 mg total) by mouth daily. Swallow whole. [name], [name], PA-C Taking Active   aspirin EC 81 MG tablet [id number] No Take 1 tablet (81 mg total) by mouth daily. Swallow whole. Patwardhan, Anabel Bene, MD Taking Active   atorvastatin (LIPITOR) 40 MG tablet [id number]  Take 1 tablet (40 mg total) by  mouth daily. Elder [name], MD  Active   Blood Pressure Monitor KIT [id number] No Use to check blood pressure dailly. [name], MD Taking Active   cholecalciferol (VITAMIN D) 25 MCG (1000 UNIT) tablet [id number] No Take by mouth.  Taking Active   empagliflozin (JARDIANCE)  10 MG TABS tablet [id number]  Take 1 tablet (10 mg total) by mouth daily before breakfast. Patwardhan, Anabel Bene, MD  Active   hydrALAZINE (APRESOLINE) 50 MG tablet [id number]  Take 1 tablet (50 mg total) by mouth every 8 (eight) hours. Patwardhan, Anabel Bene, MD  Active   isosorbide dinitrate (ISORDIL) 30 MG tablet [id number]  Take 1 tablet (30 mg total) by mouth 3 (three) times daily. Patwardhan, Anabel Bene, MD  Active   lacosamide (VIMPAT) 200 MG TABS tablet [id number]  TAKE 1 TABLET BY MOUTH 2 (TWO) TIMES DAILY.(AM+PM) Sater, Pearletha Furl, MD  Active   levETIRAcetam (KEPPRA) 750 MG tablet [id number]  TAKE 2 TABLETS BY MOUTH 2 (TWO) TIMES DAILY.(2AM+2PM) Penumalli, Vikram R, MD  Active   metoprolol succinate (TOPROL-XL) 100 MG 24 hr tablet [id number]  Take 0.5 tablets (50 mg total) by mouth daily. Take with or immediately following a meal. Patwardhan, Manish J, MD  Active   naltrexone (DEPADE) 50 MG tablet [id number]  TAKE 1 TABLET (50 MG TOTAL) BY MOUTH DAILY (AM) Modena Slater, DO  Active   sacubitril-valsartan (ENTRESTO) 49-51 MG [id number]  Take 1 tablet by mouth 2 (two) times daily. Start on 06/29/2021 Elder [name], MD  Active   sertraline (ZOLOFT) 25 MG tablet [id number]  Take 1 tablet (25 mg total) by mouth daily. [name], DO  Active   Med List Note [name], CPhT 02/24/21 1756): [place]: "12/08/20 receiving PAP through [employer], approved until 12/08/2022"           Patient Active Problem List   Diagnosis Date Noted   MDD (major depressive disorder) 07/17/2023   Hyperkalemia 07/17/2023   Poor appetite 02/25/2023   Substance abuse (HCC) 02/25/2023   Healthcare maintenance 07/11/2022   Tobacco use disorder 07/11/2022   Pain due to onychomycosis of toenails of both feet 07/05/2022   Porokeratosis 07/05/2022   Vitamin D deficiency 09/07/2021   Cognitive impairment 09/06/2021   Coronary artery disease    HFrEF (heart failure with reduced ejection fraction) (HCC) 05/10/2021    Seizures (HCC) 02/24/2021   Aortic atherosclerosis (HCC) 11/22/2020   History of seizures 11/22/2020   Alcohol use disorder, severe, dependence (HCC) 11/22/2020   Controlled type 2 diabetes mellitus with microalbuminuria, without long-term current use of insulin (HCC) 11/22/2020   Anemia 11/22/2020   Hyperlipidemia 11/22/2020   Essential hypertension 10/08/2019   History of CVA (cerebrovascular accident) 10/02/2019   Conditions to be addressed/monitored per PCP order:  Chronic healthcare management needs, tobacco use, h/o ETOH abuse, h/o seizures, HLD, CAD, h/o syncope, DM  Care Plan : RN Care Manager Plan of Care  Updates made by Danie Chandler, RN since 07/18/2023 12:00 AM     Problem: Chronic Disease Management and Care Coordination Needs for DMII, HTN, CAD, Seizures   Priority: High     Long-Range Goal: Development of Plan of Care for Chronic Disease Management and Care Coordination Needs (CAD, HTN, DMII, Seizures)   Start Date: 08/08/2021  Expected End Date: 10/15/2023  Priority: High  Note:   Current Barriers:  Knowledge Deficits related to plan of care for management of CAD, HTN, DMII, and Seizures, tobacco use, HLD, h/o CVA Chronic  Disease Management support and education needs related to CAD, HTN, DMII, and Seizures, tobacco use, HLD, h/o CVA Cognitive Deficits 07/18/23:  Seen by PCP yesterday, BP stable.  Started on Zoloft by PCP for depression-drinks ETOH  d/t depression per patient.  Has h/o seizures-no activity.  Smokes 3 cigars a day  No available blood sugar readings.  No issues/needs today per patient's wife, DPR.  RNCM Clinical Goal(s):  Patient/ DPR  will verbalize understanding of plan for management of CAD, HTN, DMII, and Seizures as evidenced by improved management of these chronic diseases. verbalize basic understanding of CAD, HTN, DMII, and Seizures disease process and self health management plan as evidenced by noted improvement of management of these chronic  diseases. take all medications exactly as prescribed and will call provider for medication related questions as evidenced by being compliant with all medications    attend all scheduled medical appointments     demonstrate improved adherence to prescribed treatment plan for CAD, HTN, DMII, and Seizures as evidenced by overall improved management of these chronic diseases continue to work with Medical illustrator and/or Social Worker to address care management and care coordination needs related to CAD, HTN, DMII, and Seizures as evidenced by adherence to CM Team Scheduled appointments     work with pharmacist to address Financial constraints related to obtaining medications and Medication procurement related to CAD, HTN, DMII, and Seizures as evidenced by review of EMR and patient or pharmacist report    work with Child psychotherapist to address Financial constraints related to safe affordable Housing and utility payment , Housing barriers, ADL IADL limitations, Cognitive Deficits, Memory Deficits, Inability to perform IADL's independently, and Lacks knowledge of community resource: safe and affordable Section 8 Housing, utility payment assistance and dental care providers covered by [employer] insurance related to the management of CAD, HTN, DMII, and Seizures as evidenced by review of EMR and patient or Child psychotherapist report through collaboration with Medical illustrator, provider, and care team.   Interventions: Inter-disciplinary care team collaboration (see longitudinal plan of care) Evaluation of current treatment plan related to  self management and patient's adherence to plan as established by provider BSW referral for Podiatry resources-completed Collaborated with BSW. Pharmacy referral for medication assistance-completed Collaborated with Pharmacy  CAD  (Status: Goal on Track (progressing): YES.) Long Term Goal  Assessed understanding of CAD diagnosis Medications reviewed including medications  utilized in CAD treatment plan Provided education on importance of blood pressure control in management of CAD; Counseled on the importance of exercise goals with target of 150 minutes per week Reviewed Importance of taking all medications as prescribed Reviewed Importance of attending all scheduled provider appointments Assessed social determinant of health barriers;  Diabetes:  (Status: Goal on Track (progressing): YES.) Long Term Goal  Lab Results  Component Value Date   HGBA1C=5.0 on 02/25/23     HGBA1C=5.9 on 12/21/21  HGBA1C=5.8 on 07/17/23  Assessed patient's / DPR's understanding of A1c goal: <7% Provided education to patient / DPR about basic DM disease process; Reviewed medications with patient and discussed importance of medication adherence;        Discussed plans with patient / DPR for ongoing care management follow up and provided patient with direct contact information for care management team;      Reviewed scheduled/upcoming provider appointments        Referral made to pharmacy team for assistance with complex medication regimen; [facility] Pharmacist involved in care;       Referral  made to social work team for assistance with housing and utility payment assistance, dental care providers covered by Managed Medicaid; [facility] BSW involved in care;      Assessed social determinant of health barriers;        [name] denies any episodes or any signs/symptoms of Hypoglycemia or Hyperglycemia.   Seizures  (Status: Goal on Track (progressing): YES.) Long Term Goal  Evaluation of current treatment plan related to  Seizures ,  and any seizure activity,  self-management and patient's / DPR's adherence to plan as established by provider. Discussed plans with patient / DPR for ongoing care management follow up and provided patient / DPR with direct contact information for care management team Advised patient / DPR to report any seizure activity to PCP; Reviewed medications with patient and  discussed importance of medication compliance; Reviewed scheduled/upcoming provider appointments  Social Work referral for Housing, utility payment and dental care providers-completed Pharmacy referral for complex medication regimen; Assessed social determinant of health barriers;  [name] reports no recent seizures.  Patient currently has all medications per patient's wife.  Hypertension: (Status: Goal on Track (progressing): YES.) Long Term Goal  Last practice recorded BP readings:  BP Readings from Last 3 Encounters:              02/25/23-110/85    0220/25-116/64    11/10/22-126/75  Most recent eGFR/CrCl:  Lab Results  Component Value Date   EGFR 92 06/06/2021    No components found for: CRCL  Evaluation of current treatment plan related to hypertension self management and patient's / DPR's adherence to plan as established by provider;   Reviewed prescribed diet Low Salt, Heart Healthy Reviewed medications with patient / DPR and discussed importance of compliance  Provided assistance with obtaining home blood pressure monitor via contacting  [facility].  [facility] staff confirmed that blood pressure monitor is ready.  I requested they deliver the monitor to patient's new home.  [facility] will deliver the monitor.;  Counseled on the importance of exercise goals with target of 150 minutes per week Discussed plans with patient for ongoing care management follow up and provided patient / DPR with direct contact information for care management team; Reviewed scheduled/upcoming provider appointments Assessed social determinant of health barriers;   Patient Goals/Self-Care Activities: Take medications as prescribed   Attend all scheduled provider appointments Call pharmacy for medication refills 3-7 days in advance of running out of medications Call provider office for new concerns or questions  Work with the social worker to address care coordination needs  and will continue to work with the clinical team to address health care and disease management related needs   Long-Range Goal: Establish Plan of Care for Chronic Disease Management Needs   Priority: High  Note:   Timeframe:  Long-Range Goal Priority:  High Start Date:    12/05/21                         Expected End Date:  ongoing                     Follow Up Date 09/13/23   - schedule appointment for vaccines needed due to my age or health - schedule recommended health tests - schedule and keep appointment for annual check-up    Why is this important?   Screening tests can find diseases early when they are easier to treat.  Your doctor or nurse will  talk with you about which tests are important for you.  Getting shots for common diseases like the flu and shingles will help prevent them.   07/18/23: Seen by PCP yesterday, CARDS appt in March and PCP f/u again in April   Follow Up:  Patient/ DPR agrees to Care Plan and Follow-up.  Plan: The Managed Medicaid care management team will reach out to the patient / DPR again over the next 60 business  days. and The  Patient / DPR has been provided with contact information for the Managed Medicaid care management team and has been advised to call with any health related questions or concerns.  Date/time of next scheduled RN care management/care coordination outreach:  09/13/23 at 0900.

## 2023-07-18 NOTE — Telephone Encounter (Signed)
PCS forms faxed by [facility] [phone]  Case.

## 2023-07-19 DIAGNOSIS — R4189 Other symptoms and signs involving cognitive functions and awareness: Secondary | ICD-10-CM | POA: Diagnosis not present

## 2023-07-19 LAB — BMP8+ANION GAP
Anion Gap: 20 mmol/L — ABNORMAL HIGH (ref 10.0–18.0)
BUN/Creatinine Ratio: 12 (ref 10–24)
BUN: 12 mg/dL (ref 8–27)
CO2: 23 mmol/L (ref 20–29)
Calcium: 9.1 mg/dL (ref 8.6–10.2)
Chloride: 102 mmol/L (ref 96–106)
Creatinine, Ser: 0.99 mg/dL (ref 0.76–1.27)
Glucose: 98 mg/dL (ref 70–99)
Potassium: 4.3 mmol/L (ref 3.5–5.2)
Sodium: 145 mmol/L — ABNORMAL HIGH (ref 134–144)
eGFR: 87 mL/min/{1.73_m2} (ref >59)

## 2023-07-19 NOTE — Progress Notes (Signed)
Unable to reach patient by phone. Kidney function wnl and K has improved to 4.3/wnl.

## 2023-07-19 NOTE — Progress Notes (Signed)
 Internal Medicine Clinic Attending  Case discussed with the resident physician at the time of the visit.  We reviewed the patient's history, exam, and pertinent patient test results.  I agree with the assessment, diagnosis, and plan of care documented in the resident's note.

## 2023-07-21 DIAGNOSIS — R4189 Other symptoms and signs involving cognitive functions and awareness: Secondary | ICD-10-CM | POA: Diagnosis not present

## 2023-07-22 DIAGNOSIS — R4189 Other symptoms and signs involving cognitive functions and awareness: Secondary | ICD-10-CM | POA: Diagnosis not present

## 2023-07-23 ENCOUNTER — Other Ambulatory Visit: Payer: Self-pay | Admitting: Cardiology

## 2023-07-23 DIAGNOSIS — I10 Essential (primary) hypertension: Secondary | ICD-10-CM

## 2023-07-23 DIAGNOSIS — R4189 Other symptoms and signs involving cognitive functions and awareness: Secondary | ICD-10-CM | POA: Diagnosis not present

## 2023-07-24 DIAGNOSIS — R4189 Other symptoms and signs involving cognitive functions and awareness: Secondary | ICD-10-CM | POA: Diagnosis not present

## 2023-07-25 DIAGNOSIS — R4189 Other symptoms and signs involving cognitive functions and awareness: Secondary | ICD-10-CM | POA: Diagnosis not present

## 2023-07-26 DIAGNOSIS — R4189 Other symptoms and signs involving cognitive functions and awareness: Secondary | ICD-10-CM | POA: Diagnosis not present

## 2023-07-30 DIAGNOSIS — R4189 Other symptoms and signs involving cognitive functions and awareness: Secondary | ICD-10-CM | POA: Diagnosis not present

## 2023-07-31 DIAGNOSIS — R4189 Other symptoms and signs involving cognitive functions and awareness: Secondary | ICD-10-CM | POA: Diagnosis not present

## 2023-08-01 DIAGNOSIS — R4189 Other symptoms and signs involving cognitive functions and awareness: Secondary | ICD-10-CM | POA: Diagnosis not present

## 2023-08-01 NOTE — Telephone Encounter (Signed)
 Megan with [employer] is calling back to speak with Lela regarding the pt [place] 3051 form needing to be signed. She re faxed the form back on 07/30/2023. Called the office, Lela took over the call.

## 2023-08-02 DIAGNOSIS — R4189 Other symptoms and signs involving cognitive functions and awareness: Secondary | ICD-10-CM | POA: Diagnosis not present

## 2023-08-05 DIAGNOSIS — R4189 Other symptoms and signs involving cognitive functions and awareness: Secondary | ICD-10-CM | POA: Diagnosis not present

## 2023-08-06 DIAGNOSIS — R4189 Other symptoms and signs involving cognitive functions and awareness: Secondary | ICD-10-CM | POA: Diagnosis not present

## 2023-08-07 DIAGNOSIS — R4189 Other symptoms and signs involving cognitive functions and awareness: Secondary | ICD-10-CM | POA: Diagnosis not present

## 2023-08-08 ENCOUNTER — Ambulatory Visit: Payer: Medicaid Other | Attending: Cardiology | Admitting: Cardiology

## 2023-08-08 DIAGNOSIS — R4189 Other symptoms and signs involving cognitive functions and awareness: Secondary | ICD-10-CM | POA: Diagnosis not present

## 2023-08-08 NOTE — Progress Notes (Deleted)
  Cardiology Office Note:  .   Date:  08/08/2023  ID:  Arthur [name], DOB 01-29-1961, MRN [id number] PCP: Modena Slater, DO  [facility] Providers Cardiologist:  Truett Mainland, MD PCP: Modena Slater, DO  No chief complaint on file.     History of Present Illness: Arthur [name]    Arthur [name] is a 63 y.o. male with hypertension, type 2 diabetes mellitus, h/o alcohol abuse, nonobstructive CAD, HFrEF, nonischemic cardiomyopathy, seizure disorder.    There were no vitals filed for this visit.   ROS: *** ROS   Studies Reviewed: .        *** Independently interpreted 06/2023: HbA1C 5.8% Cr 0.99, Na 145   01/2024: Chol 109, TG 32, HDL 66, LDL 33  Risk Assessment/Calculations:   {Does this patient have ATRIAL FIBRILLATION?[phone]}     Physical Exam:   Physical Exam   VISIT DIAGNOSES: No diagnosis found.   ASSESSMENT AND PLAN: .    Arthur [name] is a 63 y.o. male with hypertension, type 2 diabetes mellitus, h/o alcohol abuse, nonobstructive CAD, HFrEF, nonischemic cardiomyopathy, seizure disorder.   *** HFrEF: Euvolemic. EF recovered on echocardiogram (12/2021). Continue current GDMT for HFrEF.  ***Changed metoprolol tartrate 50 mg bid to metoprolol succinate 50 mg daily. Refilled Entresto 49-51 mg bid, Isordil 30 mg tid, hydralazine 50 mg tid. Jardiance 10 mg daily. Cautioned against alcohol use.   *** CAD: Nonobstrcutvie. Continue Aspirin, statin   *** Hypertension: Controlled. Also on amlodipine 10 mg daily in additin to above meds.   {Are you ordering a CV Procedure (e.g. stress test, cath, DCCV, TEE, etc)?   Press F2        :[id number]}    No orders of the defined types were placed in this encounter.    F/u in ***  Signed, Elder [name], MD

## 2023-08-09 ENCOUNTER — Encounter: Payer: Self-pay | Admitting: Cardiology

## 2023-08-12 DIAGNOSIS — R4189 Other symptoms and signs involving cognitive functions and awareness: Secondary | ICD-10-CM | POA: Diagnosis not present

## 2023-08-13 DIAGNOSIS — R4189 Other symptoms and signs involving cognitive functions and awareness: Secondary | ICD-10-CM | POA: Diagnosis not present

## 2023-08-15 ENCOUNTER — Telehealth: Payer: Self-pay | Admitting: Student

## 2023-08-15 DIAGNOSIS — R4189 Other symptoms and signs involving cognitive functions and awareness: Secondary | ICD-10-CM | POA: Diagnosis not present

## 2023-08-15 NOTE — Telephone Encounter (Signed)
 Copied from CRM [id number]. Topic: General - Other >> Aug 15, 2023 10:13 AM Carrielelia G wrote: Reason for CRM: Aura Camps calling regarding patient [place] 9147 form, there are three signatures that are missing from step four.  Please sign and resend, also [employer] will be refaxing the forms. Please advise.

## 2023-08-16 DIAGNOSIS — R4189 Other symptoms and signs involving cognitive functions and awareness: Secondary | ICD-10-CM | POA: Diagnosis not present

## 2023-08-19 ENCOUNTER — Other Ambulatory Visit: Payer: Self-pay | Admitting: Diagnostic Neuroimaging

## 2023-08-19 ENCOUNTER — Telehealth: Payer: Self-pay

## 2023-08-19 DIAGNOSIS — R3981 Functional urinary incontinence: Secondary | ICD-10-CM | POA: Diagnosis not present

## 2023-08-19 DIAGNOSIS — R569 Unspecified convulsions: Secondary | ICD-10-CM

## 2023-08-19 DIAGNOSIS — I639 Cerebral infarction, unspecified: Secondary | ICD-10-CM | POA: Diagnosis not present

## 2023-08-19 DIAGNOSIS — R269 Unspecified abnormalities of gait and mobility: Secondary | ICD-10-CM | POA: Diagnosis not present

## 2023-08-19 NOTE — Telephone Encounter (Signed)
 Had refill request for Keppra. Pt has not been seen since Aug 2023. Spoke with Pt's wife regarding need for an office visit or if stable PCP can take over med management. Wife stated Pt can come in for a visit at [facility]. Wife will check with caregiver to be sure she can bring PT to appt on Monday 3/31.  WHEN WIFE CALLS BACK PLACE PT IN VISIT WITH JESSICA ON [facility] 08/26/23 AT 9:45AM.

## 2023-08-21 DIAGNOSIS — R4189 Other symptoms and signs involving cognitive functions and awareness: Secondary | ICD-10-CM | POA: Diagnosis not present

## 2023-08-22 ENCOUNTER — Telehealth: Payer: Self-pay | Admitting: Student

## 2023-08-22 ENCOUNTER — Telehealth: Payer: Self-pay

## 2023-08-22 ENCOUNTER — Telehealth: Payer: Self-pay | Admitting: *Deleted

## 2023-08-22 DIAGNOSIS — R4189 Other symptoms and signs involving cognitive functions and awareness: Secondary | ICD-10-CM | POA: Diagnosis not present

## 2023-08-22 NOTE — Telephone Encounter (Signed)
 Returned call to [place] -[phone]- in regards to section 4 of the form in which all 4 areas was initialed. She was able to pull up form showing that this was already done. Patient was seen and form was faxed 08-16-2023.

## 2023-08-22 NOTE — Telephone Encounter (Signed)
 Copied from CRM [id number]. Topic: General - Other >> Aug 15, 2023 10:13 AM Carrielelia G wrote: Reason for CRM: Aura Camps calling regarding patient [place] 7846 form, there are three signatures that are missing from step four.  Please sign and resend, also [employer] will be refaxing the forms. Please advise. >> Aug 22, 2023 12:00 PM Dennison Nancy wrote: Aundra Millet( care manager) of [employer]    request that the  [place] 3051  Form was fax on 08/15/23 to a group at [employer]    never recieved    need to be refax to the [phone]  , need soon as possible    Please call with further questions [phone]   >> Aug 22, 2023 10:42 AM Irine Seal wrote: Wille Celeste calling for follow up-  regarding the [place] 3051 form, which was faxed by [facility] but is missing three provider signatures on Step 4. The provider  stillneeds to complete these signatures to ensure the patient does not lose services, which are set to end tomorrow as the required document has not yet been received.

## 2023-08-22 NOTE — Telephone Encounter (Signed)
 Left voice message [phone]- for Arthur [name] that our faxing system is down so unable to send the information back again on Arthur [name].

## 2023-08-22 NOTE — Telephone Encounter (Signed)
 Copied from CRM [id number]. Topic: General - Other >> Aug 15, 2023 10:13 AM Carrielelia G wrote: Reason for CRM: Aura Camps calling regarding patient [place] 1478 form, there are three signatures that are missing from step four.  Please sign and resend, also [employer] will be refaxing the forms. Please advise. >> Aug 22, 2023 10:42 AM Irine Seal wrote: Wille Celeste calling for follow up-  regarding the [place] 3051 form, which was faxed by [facility] but is missing three provider signatures on Step 4. The provider  stillneeds to complete these signatures to ensure the patient does not lose services, which are set to end tomorrow as the required document has not yet been received.

## 2023-08-22 NOTE — Telephone Encounter (Signed)
 Spoke with Aura Camps [phone] regarding section 4 of  the PCS form. Aura Camps was able to locate the form showing being completed with all 4 blanks already initialed.

## 2023-08-22 NOTE — Telephone Encounter (Signed)
 Copied from CRM [id number]. Topic: General - Other >> Aug 22, 2023 11:57 AM Dennison Nancy wrote: Reason for CRM: Aundra Millet( care manager) of [employer]  request that the  LTSS 3051  Form was fax on 08/15/23 to a group at [employer] never recieved  need to be refax to the [phone]  , need soon as possible  Please call with further questions [phone] >> Aug 22, 2023 12:02 PM Dennison Nancy wrote: Try to deleted this crm since there are other crms related do not have option to delete   Please see Prev Message sent.

## 2023-08-23 DIAGNOSIS — R4189 Other symptoms and signs involving cognitive functions and awareness: Secondary | ICD-10-CM | POA: Diagnosis not present

## 2023-08-26 DIAGNOSIS — R4189 Other symptoms and signs involving cognitive functions and awareness: Secondary | ICD-10-CM | POA: Diagnosis not present

## 2023-08-27 ENCOUNTER — Telehealth: Payer: Self-pay | Admitting: *Deleted

## 2023-08-27 DIAGNOSIS — R4189 Other symptoms and signs involving cognitive functions and awareness: Secondary | ICD-10-CM | POA: Diagnosis not present

## 2023-08-27 NOTE — Telephone Encounter (Signed)
 PCS form faxed to [facility] [phone]. Via FAX[employer] on 3-31/2025 @ 10:18 am.

## 2023-08-28 ENCOUNTER — Encounter: Payer: Self-pay | Admitting: Student

## 2023-08-28 ENCOUNTER — Ambulatory Visit: Payer: Medicaid Other | Admitting: Student

## 2023-08-28 VITALS — BP 100/59 | HR 67 | Temp 97.5°F | Wt 132.8 lb

## 2023-08-28 DIAGNOSIS — F321 Major depressive disorder, single episode, moderate: Secondary | ICD-10-CM | POA: Diagnosis present

## 2023-08-28 DIAGNOSIS — R569 Unspecified convulsions: Secondary | ICD-10-CM | POA: Diagnosis not present

## 2023-08-28 DIAGNOSIS — F1020 Alcohol dependence, uncomplicated: Secondary | ICD-10-CM | POA: Diagnosis not present

## 2023-08-28 DIAGNOSIS — I6930 Unspecified sequelae of cerebral infarction: Secondary | ICD-10-CM

## 2023-08-28 DIAGNOSIS — I69398 Other sequelae of cerebral infarction: Secondary | ICD-10-CM

## 2023-08-28 DIAGNOSIS — F17200 Nicotine dependence, unspecified, uncomplicated: Secondary | ICD-10-CM

## 2023-08-28 DIAGNOSIS — I951 Orthostatic hypotension: Secondary | ICD-10-CM | POA: Diagnosis not present

## 2023-08-28 DIAGNOSIS — F17290 Nicotine dependence, other tobacco product, uncomplicated: Secondary | ICD-10-CM

## 2023-08-28 DIAGNOSIS — R4189 Other symptoms and signs involving cognitive functions and awareness: Secondary | ICD-10-CM | POA: Diagnosis not present

## 2023-08-28 MED ORDER — SERTRALINE HCL 50 MG PO TABS
50.0000 mg | ORAL_TABLET | Freq: Every day | ORAL | 2 refills | Status: AC
Start: 2023-08-28 — End: 2024-08-27

## 2023-08-28 NOTE — Assessment & Plan Note (Signed)
 He requires advanced care due to his history of CVA with residual cognitive impairment.  His nurse is present in the room and she helps him with most of his ADLs.  In addition he has residual speech deficits that caused him frustration.  She is providing a level of care beyond her typical duties and is requesting that I fill out a CAP form more care, which I will do.

## 2023-08-28 NOTE — Progress Notes (Signed)
 CC: Follow up of depression  HPI:  Arthur [name] is a 63 y.o. male with a PMH stated below who presents today for follow up depression.  He is present with his nurse who takes care of him daily.  They report improvement having started sertraline.  Primarily he feels less depressed and is also less irritable with family members, which was becoming a problem.  They do think that he remains somewhat down and irritable and so are wondering if he needs more medicine.  He does not have any suicidal or homicidal ideation.  He reports occasional anxiety  Please see problem based assessment and plan for additional details.  Past Medical History:  Diagnosis Date   DM2 (diabetes mellitus, type 2) (HCC)    ETOH abuse    Hypertension    Seizures (HCC)    Transaminitis     Review of Systems: ROS negative except for what is noted on the assessment and plan.  Vitals:   08/28/23 0909  BP: (!) 100/59  Pulse: 67  Temp: (!) 97.5 F (36.4 C)  TempSrc: Oral  SpO2: 100%  Weight: 132 lb 12.8 oz (60.2 kg)    Physical Exam: Constitutional: well-appearing man in no acute distress Cardiovascular: regular rate and rhythm, no m/r/g Pulmonary/Chest: normal work of breathing on room air, lungs clear to auscultation bilaterally Abdominal: soft, non-tender, non-distended MSK: normal bulk and tone Skin: warm and dry Psych: normal mood and behavior  Assessment & Plan:   Patient discussed with Dr. Mayford Knife  MDD (major depressive disorder) Today is a 6-week follow-up for MDD.  At last visit, PHQ-9 was 15 and he was started on sertraline 25.  Caution taken with med choice due to a history of seizures with alcohol and post-CVA.  Symptoms are hopelessness and irritability.  I expect his disease is influenced by the loss of family members over the last few years as well as his stroke in 2022.  Fortunately the medicine is helping him.  He is getting enjoyment out of his daily activities and his  irritability with family members is reduced.  Him and his nurse who are present in the room agree that there is further room for improvement, however.  PHQ-9 today has improved to 9. - Increase sertraline 25 to 50 daily  Seizures (HCC) No recent seizures.  He is managed with Vimpat 200 daily and Keppra 750 daily.  He is adherent with these medicines.  Given his smoking history and depression, it would be nice to utilize Wellbutrin to help both but will avoid given this history.  History of cerebrovascular accident (CVA) with residual deficit He requires advanced care due to his history of CVA with residual cognitive impairment.  His nurse is present in the room and she helps him with most of his ADLs.  In addition he has residual speech deficits that caused him frustration.  She is providing a level of care beyond her typical duties and is requesting that I fill out a CAP form more care, which I will do.  Orthostatic hypotension Blood pressure in office 100/59 with orthostatic changes and some dizziness with standing.  He is on many GDMT given his history of heart failure with reduced ejection fraction.  However he is also on other medicines that are not part of GDMT.  I will stop his hydralazine, which she took this morning and takes 3 times a day.  I will also ask him to follow-up with his cardiologist, whom he has not  seen in a year. - Stop hydralazine 50 three times daily - Follow-up with cardiology - Check orthostatics at next visit for further changes if needed  Tobacco use disorder Continues Black&Mild cigars 2-3 daily. Tried to quit in the past using chantix, but no luck.  Alcohol use disorder, severe, dependence (HCC) Since last visit, he has replaced his alcohol with alcohol-free beer. - Continue naltrexone 50 mg daily   RTC in 6 weeks for depression and orthostatic hypotension follow up.   Katheran James, D.O. [facility] Internal Medicine, PGY-1 Phone: [phone] Date  08/28/2023 Time 1:26 PM

## 2023-08-28 NOTE — Patient Instructions (Signed)
 Increase sertraline to 50mg  daily. This can be taken as a single daily dose.  Stop hydralazine. You do not need this for your blood pressure anymore.  Please make an appointment to see your cardiologist.

## 2023-08-28 NOTE — Assessment & Plan Note (Addendum)
 Today is a 6-week follow-up for MDD.  At last visit, PHQ-9 was 15 and he was started on sertraline 25.  Caution taken with med choice due to a history of seizures with alcohol and post-CVA.  Symptoms are hopelessness and irritability.  I expect his disease is influenced by the loss of family members over the last few years as well as his stroke in 2022.  Fortunately the medicine is helping him.  He is getting enjoyment out of his daily activities and his irritability with family members is reduced.  Him and his nurse who are present in the room agree that there is further room for improvement, however.  PHQ-9 today has improved to 9. - Increase sertraline 25 to 50 daily

## 2023-08-28 NOTE — Telephone Encounter (Signed)
 Please see message below:     Copied from CRM [id number]. Topic: General - Other >> Aug 28, 2023 11:05 AM Antony Haste wrote: Reason for CRM: [place] with [facility] received a CAP form that was completed by Dr. Ninfa Meeker during the patient's initial visit today on 04/02. [place] states the paperwork is missing the practice stamp. She will be sending over the CAP form to be stamped and would like this faxed back over. Fax #: [phone] Callback #:[phone]

## 2023-08-28 NOTE — Assessment & Plan Note (Signed)
 Since last visit, he has replaced his alcohol with alcohol-free beer. - Continue naltrexone 50 mg daily

## 2023-08-28 NOTE — Assessment & Plan Note (Addendum)
 No recent seizures.  He is managed with Vimpat 200 daily and Keppra 750 daily.  He is adherent with these medicines.  Given his smoking history and depression, it would be nice to utilize Wellbutrin to help both but will avoid given this history.

## 2023-08-28 NOTE — Assessment & Plan Note (Signed)
 Continues Black&Mild cigars 2-3 daily. Tried to quit in the past using chantix, but no luck.

## 2023-08-28 NOTE — Assessment & Plan Note (Addendum)
 Blood pressure in office 100/59 with orthostatic changes and some dizziness with standing.  He is on many GDMT given his history of heart failure with reduced ejection fraction.  However he is also on other medicines that are not part of GDMT.  I will stop his hydralazine, which she took this morning and takes 3 times a day.  I will also ask him to follow-up with his cardiologist, whom he has not seen in a year. - Stop hydralazine 50 three times daily - Follow-up with cardiology - Check orthostatics at next visit for further changes if needed

## 2023-08-30 DIAGNOSIS — R4189 Other symptoms and signs involving cognitive functions and awareness: Secondary | ICD-10-CM | POA: Diagnosis not present

## 2023-09-02 DIAGNOSIS — R4189 Other symptoms and signs involving cognitive functions and awareness: Secondary | ICD-10-CM | POA: Diagnosis not present

## 2023-09-03 ENCOUNTER — Encounter: Payer: Self-pay | Admitting: Podiatry

## 2023-09-03 ENCOUNTER — Ambulatory Visit (INDEPENDENT_AMBULATORY_CARE_PROVIDER_SITE_OTHER): Admitting: Podiatry

## 2023-09-03 VITALS — Ht 62.0 in | Wt 132.8 lb

## 2023-09-03 DIAGNOSIS — Q828 Other specified congenital malformations of skin: Secondary | ICD-10-CM

## 2023-09-03 DIAGNOSIS — D492 Neoplasm of unspecified behavior of bone, soft tissue, and skin: Secondary | ICD-10-CM

## 2023-09-03 DIAGNOSIS — R4189 Other symptoms and signs involving cognitive functions and awareness: Secondary | ICD-10-CM | POA: Diagnosis not present

## 2023-09-03 NOTE — Progress Notes (Signed)
 This patient returns to my office for at risk foot care.  This patient requires this care by a professional since this patient will be at risk due to having diabetes. This patient is unable to cut calluses  himself since the patient cannot reach his callus These nails are painful walking and wearing shoes.  This patient presents for at risk foot care today.  General Appearance  Alert, conversant and in no acute stress.  Vascular  Dorsalis pedis and posterior tibial  pulses are palpable  bilaterally.  Capillary return is within normal limits  bilaterally. Temperature is within normal limits  bilaterally.  Neurologic  Senn-Weinstein monofilament wire test within normal limits  bilaterally. Muscle power within normal limits bilaterally.  Nails Thick disfigured discolored nails with subungual debris  from hallux to fifth toes bilaterally. No evidence of bacterial infection or drainage bilaterally.  Orthopedic  No limitations of motion  feet .  No crepitus or effusions noted.  No bony pathology or digital deformities noted.  Skin  normotropic skin noted bilaterally.  No signs of infections or ulcers noted.  Skin neoplasm feet  B/L.   Skin neoplasm  Porokeratosis  B/L.  Consent was obtained for treatment procedures. Debridement of skin neoplasm feet  B/L.[name]  Filed with dremel without incident.    Return office visit   4 months                   Told patient to return for periodic foot care and evaluation due to potential at risk complications.   Helane Gunther DPM

## 2023-09-04 DIAGNOSIS — R4189 Other symptoms and signs involving cognitive functions and awareness: Secondary | ICD-10-CM | POA: Diagnosis not present

## 2023-09-05 DIAGNOSIS — R4189 Other symptoms and signs involving cognitive functions and awareness: Secondary | ICD-10-CM | POA: Diagnosis not present

## 2023-09-06 DIAGNOSIS — R4189 Other symptoms and signs involving cognitive functions and awareness: Secondary | ICD-10-CM | POA: Diagnosis not present

## 2023-09-07 NOTE — Progress Notes (Signed)
 Internal Medicine Clinic Attending  Case discussed with the resident at the time of the visit.  We reviewed the resident's history and exam and pertinent patient test results.  I agree with the assessment, diagnosis, and plan of care documented in the resident's note.

## 2023-09-09 DIAGNOSIS — R4189 Other symptoms and signs involving cognitive functions and awareness: Secondary | ICD-10-CM | POA: Diagnosis not present

## 2023-09-10 DIAGNOSIS — R4189 Other symptoms and signs involving cognitive functions and awareness: Secondary | ICD-10-CM | POA: Diagnosis not present

## 2023-09-11 DIAGNOSIS — R4189 Other symptoms and signs involving cognitive functions and awareness: Secondary | ICD-10-CM | POA: Diagnosis not present

## 2023-09-12 DIAGNOSIS — R4189 Other symptoms and signs involving cognitive functions and awareness: Secondary | ICD-10-CM | POA: Diagnosis not present

## 2023-09-13 ENCOUNTER — Ambulatory Visit: Payer: Self-pay

## 2023-09-13 ENCOUNTER — Ambulatory Visit: Payer: Self-pay | Admitting: Obstetrics and Gynecology

## 2023-09-13 DIAGNOSIS — R4189 Other symptoms and signs involving cognitive functions and awareness: Secondary | ICD-10-CM | POA: Diagnosis not present

## 2023-09-17 ENCOUNTER — Telehealth: Payer: Self-pay | Admitting: *Deleted

## 2023-09-17 ENCOUNTER — Other Ambulatory Visit: Payer: Self-pay | Admitting: Diagnostic Neuroimaging

## 2023-09-17 DIAGNOSIS — R569 Unspecified convulsions: Secondary | ICD-10-CM

## 2023-09-17 DIAGNOSIS — R4189 Other symptoms and signs involving cognitive functions and awareness: Secondary | ICD-10-CM | POA: Diagnosis not present

## 2023-09-17 NOTE — Telephone Encounter (Signed)
 Spoke with [phone]) regarding total number of hours per month this patient was approved for PCS / total - 1,365 minutes weekly.

## 2023-09-18 DIAGNOSIS — R4189 Other symptoms and signs involving cognitive functions and awareness: Secondary | ICD-10-CM | POA: Diagnosis not present

## 2023-09-23 ENCOUNTER — Ambulatory Visit: Payer: Self-pay

## 2023-09-23 NOTE — Patient Instructions (Signed)
 Visit Information  [name] was given information about Medicaid Managed Care team care coordination services as a part of their [facility] Community [facility] Medicaid benefit. [name] verbally consented to engagement with the [facility] Managed Care team.   If you are experiencing a medical emergency, please call 911 or report to your local emergency department or urgent care.   If you have a non-emergency medical problem during routine business hours, please contact your provider's office and ask to speak with a nurse.   For questions related to your [facility], please call: [phone] or visit the homepage here: [email or web address]  If you would like to schedule transportation through your [facility], please call the following number at least 2 days in advance of your appointment: [phone]   Rides for urgent appointments can also be made after hours by calling Member Services.  Call the Behavioral Health Crisis Line at [phone], at any time, 24 hours a day, 7 days a week. If you are in danger or need immediate medical attention call 911.  If you would like help to quit smoking, call 1-800-QUIT-NOW ((815)432-2561) OR Espaol: 1-855-Djelo-Ya (2-725-366-4403) o para ms informacin haga clic aqu or Text READY to 474-259 to register via text  Arthur [name] - following are the goals we discussed in your visit today:   Goals Addressed             This Visit's Progress    VBCI RN Care Plan       Problems:  Chronic Disease Management support and education needs related to Seizures  Goal: Over the next  30  days days the Patient will attend all scheduled medical appointments: with PCP and specialist as evidenced by keeping al scheduled appointments        demonstrate Ongoing adherence to prescribed treatment plan for Seizures as evidenced by no admissions to the  hospital verbalize basic understanding of Seizures disease process and self health management plan as evidenced by verbal explanation lifestyle changes and consistent medication compliance   Interventions:   Evaluation of current treatment plan related to Seizures, self-management and patient's adherence to plan as established by provider. Discussed plans with patient for ongoing care management follow up and provided patient with direct contact information for care management team Reviewed medications with patient and discussed  Screening for signs and symptoms of depression related to chronic disease state  Assessed social determinant of health barriers identify triggers, such as stress, lack of sleep, or alcohol. Keep a balanced diet Exercise Get plenty of rest Reduce stress  Patient Self-Care Activities:  Attend all scheduled provider appointments Call pharmacy for medication refills 3-7 days in advance of running out of medications Call provider office for new concerns or questions  Perform all self care activities independently  Take medications as prescribed    Plan:  Telephone follow up appointment with care management team member scheduled for:  10/16/23  1045 am             Please see education materials related to Seizures provided by MyChart link.  Patient verbalizes understanding of instructions and care plan provided today and agrees to view in MyChart. Active MyChart status and patient understanding of how to access instructions and care plan via MyChart confirmed with patient.     Telephone follow up appointment with care management team member scheduled for:  10/16/23  1045 am    [name] RN, BSN, [facility]  [facility], [facility]  Care Coordinator Phone: [phone]      Following is a copy of your plan of care:  There are no care plans that you recently modified to display for this patient.

## 2023-09-23 NOTE — Patient Outreach (Signed)
 Complex Care Management   Visit Note  09/23/2023  Name:  Arthur [name] MRN: [id number] DOB: 1961-04-05  Situation: Referral received for Complex Care Management related to  Seizures  I obtained verbal consent from Patient/ Wife Raoul Byes.  Visit completed with patient/wife Raoul Byes  on the phone  Background:   Past Medical History:  Diagnosis Date   DM2 (diabetes mellitus, type 2) (HCC)    ETOH abuse    Hypertension    Seizures (HCC)    Transaminitis     Assessment: Patient Reported Symptoms:  Cognitive Cognitive Status: Able to follow simple commands, Alert and oriented to person, place, and time      Neurological Neurological Review of Symptoms: No symptoms reported Neurological Conditions: Seizures Neurological Self-Management Outcome: 4 (good)  HEENT HEENT Symptoms Reported: Nasal discharge      Cardiovascular Cardiovascular Symptoms Reported: No symptoms reported    Respiratory Respiratory Symptoms Reported: No symptoms reported    Endocrine Patient reports the following symptoms related to hypoglycemia or hyperglycemia : No symptoms reported    Gastrointestinal Gastrointestinal Symptoms Reported: Diarrhea Gastrointestinal Conditions: Diarrhea Gastrointestinal Management Strategies: Medication therapy Gastrointestinal Self-Management Outcome: 3 (uncertain)    Genitourinary Genitourinary Symptoms Reported: No symptoms reported    Integumentary Integumentary Symptoms Reported: No symptoms reported    Musculoskeletal Musculoskelatal Symptoms Reviewed: No symptoms reported   Falls in the past year?: No    Psychosocial Psychosocial Symptoms Reported: No symptoms reported            09/23/2023   11:45 AM  Depression screen PHQ 2/9  Decreased Interest 1  Down, Depressed, Hopeless 1  PHQ - 2 Score 2  Altered sleeping 0  Tired, decreased energy 1  Change in appetite 3  Feeling bad or failure about yourself  0  Moving slowly or fidgety/restless 0  Suicidal  thoughts 0  PHQ-9 Score 6  Difficult doing work/chores Not difficult at all    There were no vitals filed for this visit.  Medications Reviewed Today     Reviewed by [name], RN (Registered Nurse) on 09/23/23 at 1135  Med List Status: <None>   Medication Order Taking? Sig Documenting Provider Last Dose Status Informant  aspirin  EC 81 MG tablet [id number] Yes Take 1 tablet (81 mg total) by mouth daily. Swallow whole. Kern Pedro, PA-C Taking Active   aspirin  EC 81 MG tablet [id number]  Take 1 tablet (81 mg total) by mouth daily. Swallow whole. Cody Das, MD  Active   atorvastatin  (LIPITOR ) 40 MG tablet [id number] Yes TAKE 1 TABLET (40 MG TOTAL) BY MOUTH DAILY (PM) Patwardhan, Manish J, MD Taking Active   Blood Pressure Monitor KIT [id number] Yes Use to check blood pressure dailly. Barbara Levins, MD Taking Active   cholecalciferol  (VITAMIN D ) 25 MCG (1000 UNIT) tablet [id number] Yes Take by mouth.  Taking Active   empagliflozin  (JARDIANCE ) 10 MG TABS tablet [id number] Yes Take 1 tablet (10 mg total) by mouth daily before breakfast. Patwardhan, Kaye Parsons, MD Taking Active   isosorbide  dinitrate (ISORDIL ) 30 MG tablet [id number] Yes TAKE 1 TABLET (30 MG TOTAL) BY MOUTH 3 (THREE) TIMES DAILY (AM+NOON+PM) Patwardhan, Manish J, MD Taking Active   lacosamide  (VIMPAT ) 200 MG TABS tablet [id number] Yes TAKE 1 TABLET BY MOUTH 2 (TWO) TIMES DAILY.(AM+PM) Sater, Sherida Dimmer, MD Taking Active   levETIRAcetam  (KEPPRA ) 750 MG tablet [id number] Yes TAKE 2 TABLETS BY MOUTH 2 (TWO) TIMES DAILY.(2AM+2PM) Penumalli, Brenton Cambridge, MD Taking Active   metoprolol  succinate (TOPROL -XL)  100 MG 24 hr tablet [id number]  TAKE 1/2 TABLET BY MOUTH DAILY WITH OR IMMEDIATELY FOLLOWING A MEAL (AM) Patwardhan, Manish J, MD  Active   naltrexone  (DEPADE) 50 MG tablet [id number] Yes TAKE 1 TABLET (50 MG TOTAL) BY MOUTH DAILY (AM) Jonelle Neri, DO Taking Active   sacubitril -valsartan  (ENTRESTO ) 49-51 MG [id number] Yes TAKE 1  TABLET BY MOUTH 2 (TWO) TIMES DAILY (AM+PM) Patwardhan, Kaye Parsons, MD Taking Active   sertraline  (ZOLOFT ) 50 MG tablet [id number] Yes Take 1 tablet (50 mg total) by mouth daily. Carleen Chary, DO Taking Active   Med List Note Thurman Flores, CPhT 02/24/21 1756): Vimpat : "12/08/20 receiving PAP through [employer], approved until 12/08/2022"            Recommendation:   PCP Follow-up  Follow Up Plan:   Telephone follow up appointment with care management team member scheduled for:  10/16/23  1045 am    [name] RN, BSN, [facility] [employer]  [facility], [facility]  Care Coordinator Phone: [phone]

## 2023-09-24 DIAGNOSIS — R4189 Other symptoms and signs involving cognitive functions and awareness: Secondary | ICD-10-CM | POA: Diagnosis not present

## 2023-09-24 NOTE — Telephone Encounter (Signed)
 Pharmacist at [facility] called stating that the pt has no more levETIRAcetam  (KEPPRA ) 750 MG tablet and is needing a refill. I explained to the pharmacist that the pt has not been seen since 2023 and that the wife has been spoken to regarding this. The pharmacist will be informing the pt to call the office to schedule an appt so that the medication can be filled.

## 2023-09-25 MED ORDER — LEVETIRACETAM 750 MG PO TABS: 750.0000 mg | ORAL_TABLET | Freq: Two times a day (BID) | ORAL | 0 refills | Status: DC

## 2023-09-25 NOTE — Addendum Note (Signed)
 Addended by: Delray Fielding D on: 09/25/2023 09:02 AM   Modules accepted: Orders

## 2023-09-25 NOTE — Telephone Encounter (Signed)
 Patients wife called in about his refill request. I advised that the request was denied since we have not seen him since 2023. I was able to get him scheduled for an appointment Wednesday 5/7 with Camilo Cella NP - patient has been stable since 2023, no new seizures per wife. I asked if patient had enough medication to last him until he sees us  next week and she advised that he only has enough to last until Saturday.  Can we possibly send in a weeks worth of refills to get him to the appointment next week with Camilo Cella?   Refill can be sent to  [facility] - [place], [place] - [address] Phone: [phone]  Fax: [phone]     I stressed the importance of him making it into that appointment in order to get a full refill of his medications.

## 2023-09-25 NOTE — Telephone Encounter (Signed)
 This patient paperwork was just rec'd today and has been placed in the PCP's box to complete.  Copied from CRM [id number]. Topic: General - Other >> Sep 11, 2023  4:48 PM Brynn Caras wrote: Reason for CRM: Incontinence supplier wanted to confirm the fax number, she will be re-faxing a physician order on behalf of this patient. >> Sep 24, 2023 10:58 AM Corin V wrote: Mary with [facility] is re-faxing paperwork to the office for incontinence supplies. They keep receiving the dispensing order and are needing the forms she is resending filled out and returned ASAP.

## 2023-09-25 NOTE — Telephone Encounter (Addendum)
 30 day refill sent until pt completes appt. Per MD note, pt is to - continue levetiracetam  500mg  twice a day but 750 BID was ordered. I spoke with NP and she recommends pt FU with MD and to discuss dosage at appt.

## 2023-09-25 NOTE — Telephone Encounter (Signed)
 Copied from CRM [id number]. Topic: General - Other >> Sep 11, 2023  4:48 PM Brynn Caras wrote: Reason for CRM: Incontinence supplier wanted to confirm the fax number, she will be re-faxing a physician order on behalf of this patient. >> Sep 24, 2023 10:58 AM Corin V wrote: Mary with [facility] is re-faxing paperwork to the office for incontinence supplies. They keep receiving the dispensing order and are needing the forms she is resending filled out and returned ASAP.

## 2023-09-26 DIAGNOSIS — R4189 Other symptoms and signs involving cognitive functions and awareness: Secondary | ICD-10-CM | POA: Diagnosis not present

## 2023-09-27 ENCOUNTER — Ambulatory Visit: Payer: Self-pay

## 2023-10-01 DIAGNOSIS — R4189 Other symptoms and signs involving cognitive functions and awareness: Secondary | ICD-10-CM | POA: Diagnosis not present

## 2023-10-02 ENCOUNTER — Ambulatory Visit: Admitting: Adult Health

## 2023-10-02 DIAGNOSIS — R4189 Other symptoms and signs involving cognitive functions and awareness: Secondary | ICD-10-CM | POA: Diagnosis not present

## 2023-10-03 DIAGNOSIS — R4189 Other symptoms and signs involving cognitive functions and awareness: Secondary | ICD-10-CM | POA: Diagnosis not present

## 2023-10-04 DIAGNOSIS — R4189 Other symptoms and signs involving cognitive functions and awareness: Secondary | ICD-10-CM | POA: Diagnosis not present

## 2023-10-07 DIAGNOSIS — R4189 Other symptoms and signs involving cognitive functions and awareness: Secondary | ICD-10-CM | POA: Diagnosis not present

## 2023-10-08 DIAGNOSIS — R4189 Other symptoms and signs involving cognitive functions and awareness: Secondary | ICD-10-CM | POA: Diagnosis not present

## 2023-10-09 ENCOUNTER — Encounter: Admitting: Student

## 2023-10-09 DIAGNOSIS — R4189 Other symptoms and signs involving cognitive functions and awareness: Secondary | ICD-10-CM | POA: Diagnosis not present

## 2023-10-09 NOTE — Progress Notes (Deleted)
 CC: ***  HPI:  [name] is a 63 y.o. male with past medical history of hypertension, CAD, HFrEF, type 2 diabetes who presents for 6-week follow-up.  Please see assessment and plan for full HPI.  Patient was last seen in clinic on 04/02 for depression.  At that time they discussed his depression and reports that he has been feeling depressed.  They increase his sertraline  to 50 mg daily.  They stopped his hydralazine  at that time as he was dizzy.  He is to return.  Medications: CAD: Aspirin  81 mg daily, Lipitor  40 mg daily Vitamin D  deficiency: Vitamin D  supplementation HFrEF: Jardiance  10 mg daily, Isordil  30 mg 3 times daily, metoprolol  succinate 100 mg daily, Entresto  49-81 mg twice daily Seizures: Lacosamide  200 mg twice daily, Keppra  750 mg twice daily Alcohol use disorder: Naltrexone  50 mg daily Depression: Zoloft  50 mg daily    Past Medical History:  Diagnosis Date   DM2 (diabetes mellitus, type 2) (HCC)    ETOH abuse    Hypertension    Seizures (HCC)    Transaminitis      Current Outpatient Medications:    aspirin  EC 81 MG tablet, Take 1 tablet (81 mg total) by mouth daily. Swallow whole., Disp: 90 tablet, Rfl: 3   aspirin  EC 81 MG tablet, Take 1 tablet (81 mg total) by mouth daily. Swallow whole., Disp: 90 tablet, Rfl: 3   atorvastatin  (LIPITOR ) 40 MG tablet, TAKE 1 TABLET (40 MG TOTAL) BY MOUTH DAILY (PM), Disp: 90 tablet, Rfl: 0   Blood Pressure Monitor KIT, Use to check blood pressure dailly., Disp: 1 kit, Rfl: 0   cholecalciferol  (VITAMIN D ) 25 MCG (1000 UNIT) tablet, Take by mouth., Disp: 90 tablet, Rfl: 3   empagliflozin  (JARDIANCE ) 10 MG TABS tablet, Take 1 tablet (10 mg total) by mouth daily before breakfast., Disp: 90 tablet, Rfl: 3   isosorbide  dinitrate (ISORDIL ) 30 MG tablet, TAKE 1 TABLET (30 MG TOTAL) BY MOUTH 3 (THREE) TIMES DAILY (AM+NOON+PM), Disp: 270 tablet, Rfl: 0   lacosamide  (VIMPAT ) 200 MG TABS tablet, TAKE 1 TABLET BY MOUTH 2 (TWO)  TIMES DAILY.(AM+PM), Disp: 60 tablet, Rfl: 5   levETIRAcetam  (KEPPRA ) 750 MG tablet, Take 1 tablet (750 mg total) by mouth 2 (two) times daily., Disp: 60 tablet, Rfl: 0   metoprolol  succinate (TOPROL -XL) 100 MG 24 hr tablet, TAKE 1/2 TABLET BY MOUTH DAILY WITH OR IMMEDIATELY FOLLOWING A MEAL (AM), Disp: 45 tablet, Rfl: 0   naltrexone  (DEPADE) 50 MG tablet, TAKE 1 TABLET (50 MG TOTAL) BY MOUTH DAILY (AM), Disp: 30 tablet, Rfl: 0   sacubitril -valsartan  (ENTRESTO ) 49-51 MG, TAKE 1 TABLET BY MOUTH 2 (TWO) TIMES DAILY (AM+PM), Disp: 180 tablet, Rfl: 0   sertraline  (ZOLOFT ) 50 MG tablet, Take 1 tablet (50 mg total) by mouth daily., Disp: 30 tablet, Rfl: 2  Review of Systems:  ***  Constitutional: Eye: Respiratory: Cardiovascular: GI: MSK: GU: Skin: Neuro: Endocrine:   Physical Exam:  There were no vitals filed for this visit. *** General: Patient is sitting comfortably in the room  Eyes: Pupils equal and reactive to light, EOM intact  Head: Normocephalic, atraumatic  Neck: Supple, nontender, full range of motion, No JVD Cardio: Regular rate and rhythm, no murmurs, rubs or gallops. 2+ pulses to bilateral upper and lower extremities  Chest: No chest tenderness Pulmonary: Clear to ausculation bilaterally with no rales, rhonchi, and crackles  Abdomen: Soft, nontender with normoactive bowel sounds with no rebound or guarding  Neuro: Alert and orientated x3. CN II-XII intact. Sensation intact to upper and lower extremities. 2+ patellar reflex.  Back: No midline tenderness, no step off or deformities noted. No paraspinal muscle tenderness.  Skin: No rashes noted  MSK: 5/5 strength to upper and lower extremities.    Assessment & Plan:   No problem-specific Assessment & Plan notes found for this encounter.    Patient {GC/GE:3044014::"discussed with","seen with"} Dr. {NAMES:[id number]::"Guilloud","Hoffman","Mullen","Narendra","Williams","Vincent"}  Jonelle Neri, DO PGY-2 Internal Medicine  Resident

## 2023-10-10 DIAGNOSIS — R4189 Other symptoms and signs involving cognitive functions and awareness: Secondary | ICD-10-CM | POA: Diagnosis not present

## 2023-10-11 DIAGNOSIS — R4189 Other symptoms and signs involving cognitive functions and awareness: Secondary | ICD-10-CM | POA: Diagnosis not present

## 2023-10-14 DIAGNOSIS — R4189 Other symptoms and signs involving cognitive functions and awareness: Secondary | ICD-10-CM | POA: Diagnosis not present

## 2023-10-15 DIAGNOSIS — R4189 Other symptoms and signs involving cognitive functions and awareness: Secondary | ICD-10-CM | POA: Diagnosis not present

## 2023-10-16 ENCOUNTER — Telehealth: Payer: Self-pay

## 2023-10-16 ENCOUNTER — Ambulatory Visit (INDEPENDENT_AMBULATORY_CARE_PROVIDER_SITE_OTHER): Admitting: Student

## 2023-10-16 ENCOUNTER — Encounter: Payer: Self-pay | Admitting: Student

## 2023-10-16 VITALS — BP 129/83 | HR 60 | Temp 98.2°F | Ht 62.0 in | Wt 131.2 lb

## 2023-10-16 DIAGNOSIS — F320 Major depressive disorder, single episode, mild: Secondary | ICD-10-CM

## 2023-10-16 DIAGNOSIS — Z23 Encounter for immunization: Secondary | ICD-10-CM | POA: Diagnosis present

## 2023-10-16 DIAGNOSIS — E1129 Type 2 diabetes mellitus with other diabetic kidney complication: Secondary | ICD-10-CM | POA: Diagnosis not present

## 2023-10-16 DIAGNOSIS — I10 Essential (primary) hypertension: Secondary | ICD-10-CM

## 2023-10-16 DIAGNOSIS — R809 Proteinuria, unspecified: Secondary | ICD-10-CM

## 2023-10-16 DIAGNOSIS — I951 Orthostatic hypotension: Secondary | ICD-10-CM

## 2023-10-16 DIAGNOSIS — I110 Hypertensive heart disease with heart failure: Secondary | ICD-10-CM

## 2023-10-16 DIAGNOSIS — Z7984 Long term (current) use of oral hypoglycemic drugs: Secondary | ICD-10-CM

## 2023-10-16 DIAGNOSIS — F1020 Alcohol dependence, uncomplicated: Secondary | ICD-10-CM | POA: Diagnosis not present

## 2023-10-16 DIAGNOSIS — I5020 Unspecified systolic (congestive) heart failure: Secondary | ICD-10-CM | POA: Diagnosis not present

## 2023-10-16 DIAGNOSIS — Z1211 Encounter for screening for malignant neoplasm of colon: Secondary | ICD-10-CM | POA: Insufficient documentation

## 2023-10-16 NOTE — Assessment & Plan Note (Signed)
 Patient has a past medical history of hypertension.  His current medications include Isordil  30 mg 3 times daily, metoprolol  succinate 50 mg daily and Entresto  49-51 mg twice daily.  He was seen in the clinic 6 weeks ago for concerns of orthostatic hypotension.  At that time his hydralazine  was stopped.  Patient still is orthostatic.  Will stop his Isordil .  Blood pressure today in the clinic was 129/83, however caretaker states he is consistently in the 90s at home.  Plan: - Stop Isordil  30 mg 3 times daily - Continue metoprolol  succinate 50 mg daily - Continue Entresto  49-51 mg twice daily -Follow-up in 1 month

## 2023-10-16 NOTE — Patient Instructions (Addendum)
 Arthur [name],Thank you for allowing me to take part in your care today.  Here are your instructions.  1.  Regarding your blood pressure medicine.  Stop taking your Imdur.  This is a 3 times daily medicine.  Stop taking this.  Continue taking your metoprolol , please switch this to nighttime.  Continue taking Entresto  as directed.  Please take Jardiance .  2.  I have referred you for psychiatrist to potentially do injections to help with alcohol use.  3.  Come back in 1 month  4.  Please reach out to [facility] to help with counseling. Their phone number is [phone]. Address:  [address]  5.  Please talk to alcohol Anonymous.  Thank you, Dr. Lydia Sams  If you have any other questions please contact the internal medicine clinic at [phone] If it is after hours, please call the  hospital at [phone] and then ask the person who picks up for the resident on call.

## 2023-10-16 NOTE — Assessment & Plan Note (Signed)
Patient given pneumonia vaccine today.

## 2023-10-16 NOTE — Progress Notes (Signed)
 CC: Alcohol use disorder and depression follow-up  HPI:  Arthur [name] is a 63 y.o. male with past medical history of hypertension, CAD, HFrEF, type 2 diabetes who presents for 6-week follow-up.  Please see assessment and plan for full HPI.  Medications: CAD: Aspirin  81 mg daily, Lipitor  40 mg daily Vitamin D  deficiency: Vitamin D  supplementation HFrEF: Jardiance  10 mg daily, metoprolol  succinate 50 mg daily, Entresto  49-51 mg twice daily Seizures: Lacosamide  200 mg twice daily, Keppra  750 mg twice daily Depression: Zoloft  50 mg daily  Past Medical History:  Diagnosis Date   DM2 (diabetes mellitus, type 2) (HCC)    ETOH abuse    Hypertension    Seizures (HCC)    Transaminitis      Current Outpatient Medications:    aspirin  EC 81 MG tablet, Take 1 tablet (81 mg total) by mouth daily. Swallow whole., Disp: 90 tablet, Rfl: 3   atorvastatin  (LIPITOR ) 40 MG tablet, TAKE 1 TABLET (40 MG TOTAL) BY MOUTH DAILY (PM), Disp: 90 tablet, Rfl: 0   Blood Pressure Monitor KIT, Use to check blood pressure dailly., Disp: 1 kit, Rfl: 0   cholecalciferol  (VITAMIN D ) 25 MCG (1000 UNIT) tablet, Take by mouth., Disp: 90 tablet, Rfl: 3   empagliflozin  (JARDIANCE ) 10 MG TABS tablet, Take 1 tablet (10 mg total) by mouth daily before breakfast., Disp: 90 tablet, Rfl: 3   lacosamide  (VIMPAT ) 200 MG TABS tablet, TAKE 1 TABLET BY MOUTH 2 (TWO) TIMES DAILY.(AM+PM), Disp: 60 tablet, Rfl: 5   levETIRAcetam  (KEPPRA ) 750 MG tablet, Take 1 tablet (750 mg total) by mouth 2 (two) times daily., Disp: 60 tablet, Rfl: 0   metoprolol  succinate (TOPROL -XL) 100 MG 24 hr tablet, TAKE 1/2 TABLET BY MOUTH DAILY WITH OR IMMEDIATELY FOLLOWING A MEAL (AM), Disp: 45 tablet, Rfl: 0   sacubitril -valsartan  (ENTRESTO ) 49-51 MG, TAKE 1 TABLET BY MOUTH 2 (TWO) TIMES DAILY (AM+PM), Disp: 180 tablet, Rfl: 0   sertraline  (ZOLOFT ) 50 MG tablet, Take 1 tablet (50 mg total) by mouth daily., Disp: 30 tablet, Rfl: 2  Review of Systems:    Negative except for what is stated in HPI  Physical Exam:  Vitals:   10/16/23 1040  BP: 129/83  Pulse: 60  Temp: 98.2 F (36.8 C)  TempSrc: Oral  SpO2: 99%  Weight: 131 lb 3.2 oz (59.5 kg)  Height: 5\' 2"  (1.575 m)   General: Patient is sitting comfortably in the room  Head: Normocephalic, atraumatic  Cardio: Regular rate and rhythm, no murmurs, rubs or gallops Pulmonary: Clear to ausculation bilaterally with no rales, rhonchi, and crackles    Assessment & Plan:   Alcohol use disorder, severe, dependence (HCC) Patient has a past medical history of alcohol use disorder.  He was previously started on naltrexone , but has been out of it for quite some time.  He states he does not like the medication and it makes him feel nauseous.  He comes with his caretaker today who states that he has been drinking beer.  The caretaker has tried to give him alcohol free beer, but he treats it with neighbors outside for real beer.  The caretaker is becoming frustrated with this.  The patient also has become verbally abusive to caretaker and family at home which includes a wife and a son who is autistic.  The patient states he has been struggling with alcohol use for quite some time.  Giving up alcohol has been really hard for him so he lashes out on  his family.  His caretaker states that they need help.  Counseled the patient on importance of giving up alcohol.  He understood this.  He however states he would not like to take any oral medications.  I did offer him potential injectable naltrexone .  He states he is open to that option.  Plan: - Refer to psychiatry for injectable naltrexone  - Counseled patient on the importance of alcohol cessation - Caretaker is to reach out to alcohol Anonymous for group therapy - Patient and caretaker to reach out to [facility] for individual counseling for emotional issues patient has been dealing with for giving up alcohol  Essential  hypertension Patient has a past medical history of hypertension.  His current medications include Isordil  30 mg 3 times daily, metoprolol  succinate 50 mg daily and Entresto  49-51 mg twice daily.  He was seen in the clinic 6 weeks ago for concerns of orthostatic hypotension.  At that time his hydralazine  was stopped.  Patient still is orthostatic.  Will stop his Isordil .  Blood pressure today in the clinic was 129/83, however caretaker states he is consistently in the 90s at home.  Plan: - Stop Isordil  30 mg 3 times daily - Continue metoprolol  succinate 50 mg daily - Continue Entresto  49-51 mg twice daily -Follow-up in 1 month  HFrEF (heart failure with reduced ejection fraction) (HCC) Patient has a past medical history of heart failure with reduced ejection fraction.  Most recent echo shows improved ejection fraction to 60-65%.  No acute concern for exacerbation at this time.  GDMT includes Jardiance  10 mg daily, Entresto  49-51 mg twice daily and metoprolol  succinate 50 mg daily.  He however has not been taking his Jardiance .  When asked why, he is unsure why.  Caretaker is also unsure.  I encouraged him to bring his medications to the office at next visit in 1 month.  Plan: - Continue Jardiance  10 mg daily - Continue metoprolol  succinate 50 mg daily - Continue Entresto  49-51 mg twice daily - Follow-up in 1 month  Orthostatic hypotension At previous visit patient was orthostatic.  He follows up today and is still orthostatic.  This is likely secondary to all the antihypertensives patient is on.  At previous visit hydralazine  was stopped.  Today I will stop his Isordil .  Plan: - Stop Isordil  - Continue metoprolol  succinate 50 mg daily - Continue Entresto  49-51 mg twice daily   Controlled type 2 diabetes mellitus with microalbuminuria, without long-term current use of insulin  (HCC) Patient has a past medical history of type 2 diabetes mellitus.  His 1 medication he was on was Jardiance  10  mg daily but he had stopped taking this.  He is unclear why and caretaker is unclear why.  A1c 3 months ago was 5.8%.  It is well-controlled.  Plan: - A1c at next visit - Patient referred for ophthalmologist for dilated retinal exam - Continue Jardiance  10 mg daily  MDD (major depressive disorder) Patient presents for 6-week follow-up for depression.  His depression has improved with increase of sertraline  to 50 mg daily.  Will adjunct with counseling.  Patient agreed to the plan.  Plan: - Continue sertraline  50 mg daily - Patient referred to [facility] behavioral health  Colon cancer screening Patient referred for colonoscopy for colon cancer screening  Encounter for immunization Patient given pneumonia vaccine today.  Patient discussed with Dr. Sonia Durand, DO PGY-2 Internal Medicine Resident

## 2023-10-16 NOTE — Assessment & Plan Note (Addendum)
 At previous visit patient was orthostatic.  He follows up today and is still orthostatic.  This is likely secondary to all the antihypertensives patient is on.  At previous visit hydralazine  was stopped.  Today I will stop his Isordil .  Plan: - Stop Isordil  - Continue metoprolol  succinate 50 mg daily - Continue Entresto  49-51 mg twice daily

## 2023-10-16 NOTE — Assessment & Plan Note (Signed)
 Patient presents for 6-week follow-up for depression.  His depression has improved with increase of sertraline  to 50 mg daily.  Will adjunct with counseling.  Patient agreed to the plan.  Plan: - Continue sertraline  50 mg daily - Patient referred to [facility] behavioral health

## 2023-10-16 NOTE — Assessment & Plan Note (Signed)
 Patient has a past medical history of alcohol use disorder.  He was previously started on naltrexone , but has been out of it for quite some time.  He states he does not like the medication and it makes him feel nauseous.  He comes with his caretaker today who states that he has been drinking beer.  The caretaker has tried to give him alcohol free beer, but he treats it with neighbors outside for real beer.  The caretaker is becoming frustrated with this.  The patient also has become verbally abusive to caretaker and family at home which includes a wife and a son who is autistic.  The patient states he has been struggling with alcohol use for quite some time.  Giving up alcohol has been really hard for him so he lashes out on his family.  His caretaker states that they need help.  Counseled the patient on importance of giving up alcohol.  He understood this.  He however states he would not like to take any oral medications.  I did offer him potential injectable naltrexone .  He states he is open to that option.  Plan: - Refer to psychiatry for injectable naltrexone  - Counseled patient on the importance of alcohol cessation - Caretaker is to reach out to alcohol Anonymous for group therapy - Patient and caretaker to reach out to [facility] for individual counseling for emotional issues patient has been dealing with for giving up alcohol

## 2023-10-16 NOTE — Assessment & Plan Note (Signed)
 Patient referred for colonoscopy for colon cancer screening.

## 2023-10-16 NOTE — Assessment & Plan Note (Signed)
 Patient has a past medical history of type 2 diabetes mellitus.  His 1 medication he was on was Jardiance  10 mg daily but he had stopped taking this.  He is unclear why and caretaker is unclear why.  A1c 3 months ago was 5.8%.  It is well-controlled.  Plan: - A1c at next visit - Patient referred for ophthalmologist for dilated retinal exam - Continue Jardiance  10 mg daily

## 2023-10-16 NOTE — Assessment & Plan Note (Signed)
 Patient has a past medical history of heart failure with reduced ejection fraction.  Most recent echo shows improved ejection fraction to 60-65%.  No acute concern for exacerbation at this time.  GDMT includes Jardiance  10 mg daily, Entresto  49-51 mg twice daily and metoprolol  succinate 50 mg daily.  He however has not been taking his Jardiance .  When asked why, he is unsure why.  Caretaker is also unsure.  I encouraged him to bring his medications to the office at next visit in 1 month.  Plan: - Continue Jardiance  10 mg daily - Continue metoprolol  succinate 50 mg daily - Continue Entresto  49-51 mg twice daily - Follow-up in 1 month

## 2023-10-17 DIAGNOSIS — R4189 Other symptoms and signs involving cognitive functions and awareness: Secondary | ICD-10-CM | POA: Diagnosis not present

## 2023-10-17 NOTE — Progress Notes (Signed)
 Internal Medicine Clinic Attending  Case discussed with the resident at the time of the visit.  We reviewed the resident's history and exam and pertinent patient test results.  I agree with the assessment, diagnosis, and plan of care documented in the resident's note.

## 2023-10-18 DIAGNOSIS — R4189 Other symptoms and signs involving cognitive functions and awareness: Secondary | ICD-10-CM | POA: Diagnosis not present

## 2023-10-22 DIAGNOSIS — R4189 Other symptoms and signs involving cognitive functions and awareness: Secondary | ICD-10-CM | POA: Diagnosis not present

## 2023-10-23 DIAGNOSIS — R4189 Other symptoms and signs involving cognitive functions and awareness: Secondary | ICD-10-CM | POA: Diagnosis not present

## 2023-10-24 DIAGNOSIS — R4189 Other symptoms and signs involving cognitive functions and awareness: Secondary | ICD-10-CM | POA: Diagnosis not present

## 2023-10-25 DIAGNOSIS — R4189 Other symptoms and signs involving cognitive functions and awareness: Secondary | ICD-10-CM | POA: Diagnosis not present

## 2023-10-31 ENCOUNTER — Other Ambulatory Visit: Payer: Self-pay | Admitting: Diagnostic Neuroimaging

## 2023-10-31 ENCOUNTER — Other Ambulatory Visit: Payer: Self-pay | Admitting: Cardiology

## 2023-10-31 ENCOUNTER — Other Ambulatory Visit: Payer: Self-pay

## 2023-10-31 ENCOUNTER — Other Ambulatory Visit: Payer: Self-pay | Admitting: Neurology

## 2023-10-31 DIAGNOSIS — I10 Essential (primary) hypertension: Secondary | ICD-10-CM

## 2023-10-31 DIAGNOSIS — R569 Unspecified convulsions: Secondary | ICD-10-CM

## 2023-10-31 NOTE — Patient Instructions (Signed)
 Visit Information  Mr. Haye was given information about Medicaid Managed Care team care coordination services as a part of their [facility] Community Plan Medicaid benefit. [name] verbally consented to engagement with the [facility] Managed Care team.   If you are experiencing a medical emergency, please call 911 or report to your local emergency department or urgent care.   If you have a non-emergency medical problem during routine business hours, please contact your provider's office and ask to speak with a nurse.   For questions related to your [facility], please call: [phone] or visit the homepage here: [email or web address]  If you would like to schedule transportation through your [facility], please call the following number at least 2 days in advance of your appointment: [phone]   Rides for urgent appointments can also be made after hours by calling Member Services.  Call the Behavioral Health Crisis Line at [phone], at any time, 24 hours a day, 7 days a week. If you are in danger or need immediate medical attention call 911.  If you would like help to quit smoking, call 1-800-QUIT-NOW (571-841-7248) OR Espaol: 1-855-Djelo-Ya (5-638-756-4332) o para ms informacin haga clic aqu or Text READY to 951-884 to register via text  Mr. Jenifer - following are the goals we discussed in your visit today:   Goals Addressed             This Visit's Progress    VBCI RN Care Plan       Problems:  Chronic Disease Management support and education needs related to Seizures  Goal: Over the next 30 days days the Patient will attend all scheduled medical appointments: with PCP and specialist as evidenced by keeping al scheduled appointments        demonstrate Ongoing adherence to prescribed treatment plan for Seizures as evidenced by no admissions to the  hospital verbalize basic understanding of Seizures disease process and self health management plan as evidenced by verbal explanation lifestyle changes and consistent medication compliance   Interventions:   Evaluation of current treatment plan related to Seizures, self-management and patient's adherence to plan as established by provider. Discussed plans with patient for ongoing care management follow up and provided patient with direct contact information for care management team Reviewed medications with patient and discussed - continue to take Keppra  750 mg and take as directed Follow physicians direction regarding drinking Assessed social determinant of health barriers identify triggers, such as stress, lack of sleep, or alcohol.-reach out to Alcoholics Anonymous for group therapy.  Keep a balanced diet Exercise Get plenty of rest Reduce stress  Patient Self-Care Activities:  Attend all scheduled provider appointments Call pharmacy for medication refills 3-7 days in advance of running out of medications Call provider office for new concerns or questions  Perform all self care activities independently  Take medications as prescribed    Plan:  Telephone follow up appointment with care management team member scheduled for:  12/04/23  1000 am             Please see education materials related to Seizures provided by MyChart link.  The patient verbalized understanding of instructions, educational materials, and care plan provided today and DECLINED offer to receive copy of patient instructions, educational materials, and care plan.   Telephone follow up appointment with care management team member scheduled for:  12/04/23  1000 am  [name] RN, BSN, [facility] [place]  [facility], Population  Health  Care Coordinator Phone: [phone]      Following is a copy of your plan of care:  There are no care plans that you recently modified to display for this  patient.

## 2023-10-31 NOTE — Patient Outreach (Signed)
 Complex Care Management   Visit Note  10/31/2023  Name:  Arthur [name] MRN: [id number] DOB: [birth year]  Situation: Referral received for Complex Care Management related to Seizures I obtained verbal consent from Patient.  Visit completed with patient  on the phone  Background:   Past Medical History:  Diagnosis Date   DM2 (diabetes mellitus, type 2) (HCC)    ETOH abuse    Hypertension    Seizures (HCC)    Transaminitis     Assessment: Patient Reported Symptoms:  Cognitive Cognitive Status: Able to follow simple commands, Alert and oriented to person, place, and time      Neurological Neurological Review of Symptoms: No symptoms reported    HEENT HEENT Symptoms Reported: Nasal discharge HEENT Conditions:  (Allergies or sinuses) HEENT Management Strategies: Medication therapy  (Allergies or sinuses)  Cardiovascular Cardiovascular Symptoms Reported: No symptoms reported    Respiratory Respiratory Symptoms Reported: No symptoms reported    Endocrine Patient reports the following symptoms related to hypoglycemia or hyperglycemia : No symptoms reported    Gastrointestinal Gastrointestinal Symptoms Reported: Diarrhea Gastrointestinal Conditions: Diarrhea Gastrointestinal Management Strategies: Medication therapy    Genitourinary Genitourinary Symptoms Reported: No symptoms reported    Integumentary Integumentary Symptoms Reported: No symptoms reported    Musculoskeletal Musculoskelatal Symptoms Reviewed: Difficulty walking Additional Musculoskeletal Details: feet hurt due to calluses Musculoskeletal Conditions: Other, Mobility limited Other Musculoskeletal Conditions: due to calluses Musculoskeletal Management Strategies:  (sees podiatrist) Falls in the past year?: No    Psychosocial       Quality of Family Relationships: supportive, involved Do you feel physically threatened by others?: No      10/16/2023   11:51 AM  Depression screen PHQ 2/9  Decreased Interest 3   Down, Depressed, Hopeless 0  PHQ - 2 Score 3  Altered sleeping 0  Tired, decreased energy 3  Change in appetite 0  Feeling bad or failure about yourself  0  Trouble concentrating 0  Moving slowly or fidgety/restless 3  Suicidal thoughts 0  PHQ-9 Score 9  Difficult doing work/chores Extremely dIfficult    There were no vitals filed for this visit.  Medications Reviewed Today     Reviewed by [name], RN (Registered Nurse) on 10/31/23 at 1010  Med List Status: <None>   Medication Order Taking? Sig Documenting Provider Last Dose Status Informant  aspirin  EC 81 MG tablet [id number] Yes Take 1 tablet (81 mg total) by mouth daily. Swallow whole. [name], [name], PA-C Taking Active   atorvastatin  (LIPITOR ) 40 MG tablet [id number] Yes TAKE 1 TABLET (40 MG TOTAL) BY MOUTH DAILY (PM) Patwardhan, Manish J, MD Taking Active   Blood Pressure Monitor KIT [id number] Yes Use to check blood pressure dailly. Barbara Levins, MD Taking Active   cholecalciferol  (VITAMIN D ) 25 MCG (1000 UNIT) tablet [id number] Yes Take by mouth.  Taking Active   empagliflozin  (JARDIANCE ) 10 MG TABS tablet [id number] Yes Take 1 tablet (10 mg total) by mouth daily before breakfast. Patwardhan, Kaye Parsons, MD Taking Active   lacosamide  (VIMPAT ) 200 MG TABS tablet [id number] Yes TAKE 1 TABLET BY MOUTH 2 (TWO) TIMES DAILY.(AM+PM) Sater, Sherida Dimmer, MD Taking Active   levETIRAcetam  (KEPPRA ) 750 MG tablet [id number] Yes Take 1 tablet (750 mg total) by mouth 2 (two) times daily. Penumalli, Vikram R, MD Taking Active   metoprolol  succinate (TOPROL -XL) 100 MG 24 hr tablet [id number]  TAKE 1/2 TABLET BY MOUTH DAILY WITH OR IMMEDIATELY FOLLOWING A MEAL (AM) Patwardhan, Kaye Parsons, MD  Active   sacubitril -valsartan  (ENTRESTO ) 49-51 MG [id number] Yes TAKE 1 TABLET BY MOUTH 2 (TWO) TIMES DAILY (AM+PM) Patwardhan, Manish J, MD Taking Active   sertraline  (ZOLOFT ) 50 MG tablet [id number] Yes Take 1 tablet (50 mg total) by mouth daily. Carleen Chary, DO Taking Active   Med List Note Thurman Flores, CPhT 02/24/21 1756): Vimpat : "12/08/20 receiving PAP through [employer], approved until 12/08/2022"            Recommendation:   PCP Follow-up  Follow Up Plan:   Telephone follow up appointment with care management team member scheduled for:  12/04/23  1000 am   [name] RN, BSN, [facility]  [facility], [facility] Health  Care Coordinator Phone: [phone]

## 2023-11-06 DIAGNOSIS — R269 Unspecified abnormalities of gait and mobility: Secondary | ICD-10-CM | POA: Diagnosis not present

## 2023-11-06 DIAGNOSIS — R4189 Other symptoms and signs involving cognitive functions and awareness: Secondary | ICD-10-CM | POA: Diagnosis not present

## 2023-11-06 DIAGNOSIS — R3981 Functional urinary incontinence: Secondary | ICD-10-CM | POA: Diagnosis not present

## 2023-11-06 DIAGNOSIS — I639 Cerebral infarction, unspecified: Secondary | ICD-10-CM | POA: Diagnosis not present

## 2023-11-07 ENCOUNTER — Telehealth (HOSPITAL_COMMUNITY): Payer: Self-pay

## 2023-11-07 DIAGNOSIS — R4189 Other symptoms and signs involving cognitive functions and awareness: Secondary | ICD-10-CM | POA: Diagnosis not present

## 2023-11-07 NOTE — Telephone Encounter (Signed)
 Therapist calls [place] to inquire what type of services he is interested in. His wife says he is not around and ask specifically what this is about. Therapist explains due to stated and federal confidentialy laws I would have to speak directly to him.  She says she does not know when he will be back in.   Earnie Gola, MS, LMFT, LCAS 11-07-23

## 2023-11-11 DIAGNOSIS — R4189 Other symptoms and signs involving cognitive functions and awareness: Secondary | ICD-10-CM | POA: Diagnosis not present

## 2023-11-12 DIAGNOSIS — R4189 Other symptoms and signs involving cognitive functions and awareness: Secondary | ICD-10-CM | POA: Diagnosis not present

## 2023-11-13 DIAGNOSIS — R4189 Other symptoms and signs involving cognitive functions and awareness: Secondary | ICD-10-CM | POA: Diagnosis not present

## 2023-11-14 ENCOUNTER — Encounter: Admitting: Student

## 2023-11-14 ENCOUNTER — Telehealth: Payer: Self-pay

## 2023-11-14 DIAGNOSIS — R4189 Other symptoms and signs involving cognitive functions and awareness: Secondary | ICD-10-CM | POA: Diagnosis not present

## 2023-11-14 NOTE — Progress Notes (Deleted)
 CC: ***  HPI:  [name] is a 63 y.o. male with a past medical history alcohol use disorder, type 2 diabetes, CAD, hypertension who presents for follow-up appointment.  Please see assessment plan for full HPI.   Medications: CAD: Aspirin  81 mg daily, Lipitor  40 mg daily Vitamin D  deficiency: Vitamin D  supplementation HFrEF: Jardiance  10 mg daily, metoprolol  succinate 50 mg daily, Entresto  49-51 mg twice daily Seizures: Lacosamide  200 mg twice daily, Keppra  750 mg twice daily Depression: Zoloft  50 mg daily  At previous visit, I discussed his alcohol use.  He states he likes to go outside and drink alcohol with his friends.  I told him not to do that. Addressed his heart failure.  Do not titrating medication I stopped his Isordil  as he was orthostatic  He had no idea what medications he was taking he was referred to [facility] for colonoscopy  Last A1c was 5.8 in February.  Past Medical History:  Diagnosis Date   DM2 (diabetes mellitus, type 2) (HCC)    ETOH abuse    Hypertension    Seizures (HCC)    Transaminitis      Current Outpatient Medications:    aspirin  EC 81 MG tablet, Take 1 tablet (81 mg total) by mouth daily. Swallow whole., Disp: 90 tablet, Rfl: 3   atorvastatin  (LIPITOR ) 40 MG tablet, TAKE 1 TABLET (40 MG TOTAL) BY MOUTH DAILY (PM), Disp: 30 tablet, Rfl: 0   Blood Pressure Monitor KIT, Use to check blood pressure dailly., Disp: 1 kit, Rfl: 0   cholecalciferol  (VITAMIN D ) 25 MCG (1000 UNIT) tablet, Take by mouth., Disp: 90 tablet, Rfl: 3   empagliflozin  (JARDIANCE ) 10 MG TABS tablet, Take 1 tablet (10 mg total) by mouth daily before breakfast., Disp: 90 tablet, Rfl: 3   lacosamide  (VIMPAT ) 200 MG TABS tablet, TAKE 1 TABLET BY MOUTH 2 (TWO) TIMES DAILY.(AM+PM), Disp: 60 tablet, Rfl: 5   levETIRAcetam  (KEPPRA ) 750 MG tablet, TAKE 1 TABLET BY MOUTH 2 (TWO) TIMES DAILY (AM+PM), Disp: 60 tablet, Rfl: 0   metoprolol  succinate (TOPROL -XL) 100 MG 24 hr tablet, TAKE  1/2 TABLET BY MOUTH DAILY WITH OR IMMEDIATELY FOLLOWING A MEAL (BEDTIME), Disp: 15 tablet, Rfl: 0   sacubitril -valsartan  (ENTRESTO ) 49-51 MG, TAKE 1 TABLET BY MOUTH 2 (TWO) TIMES DAILY (AM+PM), Disp: 60 tablet, Rfl: 0   sertraline  (ZOLOFT ) 50 MG tablet, Take 1 tablet (50 mg total) by mouth daily., Disp: 30 tablet, Rfl: 2  Review of Systems:  ***  Constitutional: Eye: Respiratory: Cardiovascular: GI: MSK: GU: Skin: Neuro: Endocrine:   Physical Exam:  There were no vitals filed for this visit. *** General: Patient is sitting comfortably in the room  Eyes: Pupils equal and reactive to light, EOM intact  Head: Normocephalic, atraumatic  Neck: Supple, nontender, full range of motion, No JVD Cardio: Regular rate and rhythm, no murmurs, rubs or gallops. 2+ pulses to bilateral upper and lower extremities  Chest: No chest tenderness Pulmonary: Clear to ausculation bilaterally with no rales, rhonchi, and crackles  Abdomen: Soft, nontender with normoactive bowel sounds with no rebound or guarding  Neuro: Alert and orientated x3. CN II-XII intact. Sensation intact to upper and lower extremities. 2+ patellar reflex.  Back: No midline tenderness, no step off or deformities noted. No paraspinal muscle tenderness.  Skin: No rashes noted  MSK: 5/5 strength to upper and lower extremities.    Assessment & Plan:   No problem-specific Assessment & Plan notes found for this encounter.  Patient {GC/GE:3044014::discussed with,seen with} Dr. {QMVHQ:[id number]::MWUXLKGM,WNUUVOZ,DGUYQI,HKVQQVZD,GLOVFIEP,PIRJJOA}  Jonelle Neri, DO PGY-2 Internal Medicine Resident

## 2023-11-14 NOTE — Telephone Encounter (Signed)
 I called pt to resch his appt that he had missed today 11/14/2023, no answer. I left message for patient to call the clinic back.

## 2023-11-15 DIAGNOSIS — R4189 Other symptoms and signs involving cognitive functions and awareness: Secondary | ICD-10-CM | POA: Diagnosis not present

## 2023-11-18 DIAGNOSIS — R4189 Other symptoms and signs involving cognitive functions and awareness: Secondary | ICD-10-CM | POA: Diagnosis not present

## 2023-11-21 DIAGNOSIS — R4189 Other symptoms and signs involving cognitive functions and awareness: Secondary | ICD-10-CM | POA: Diagnosis not present

## 2023-11-22 DIAGNOSIS — R4189 Other symptoms and signs involving cognitive functions and awareness: Secondary | ICD-10-CM | POA: Diagnosis not present

## 2023-11-25 DIAGNOSIS — R4189 Other symptoms and signs involving cognitive functions and awareness: Secondary | ICD-10-CM | POA: Diagnosis not present

## 2023-11-26 DIAGNOSIS — R4189 Other symptoms and signs involving cognitive functions and awareness: Secondary | ICD-10-CM | POA: Diagnosis not present

## 2023-11-27 ENCOUNTER — Emergency Department (HOSPITAL_COMMUNITY)

## 2023-11-27 ENCOUNTER — Emergency Department (HOSPITAL_COMMUNITY): Admission: EM | Admit: 2023-11-27 | Discharge: 2023-11-27 | Disposition: A | Discharge status: Home / Self Care

## 2023-11-27 DIAGNOSIS — R9089 Other abnormal findings on diagnostic imaging of central nervous system: Secondary | ICD-10-CM | POA: Diagnosis not present

## 2023-11-27 DIAGNOSIS — R4189 Other symptoms and signs involving cognitive functions and awareness: Secondary | ICD-10-CM | POA: Diagnosis not present

## 2023-11-27 DIAGNOSIS — I639 Cerebral infarction, unspecified: Secondary | ICD-10-CM | POA: Diagnosis not present

## 2023-11-27 DIAGNOSIS — F1090 Alcohol use, unspecified, uncomplicated: Secondary | ICD-10-CM | POA: Insufficient documentation

## 2023-11-27 DIAGNOSIS — S0083XA Contusion of other part of head, initial encounter: Secondary | ICD-10-CM | POA: Diagnosis not present

## 2023-11-27 DIAGNOSIS — R42 Dizziness and giddiness: Secondary | ICD-10-CM | POA: Diagnosis not present

## 2023-11-27 DIAGNOSIS — R4701 Aphasia: Secondary | ICD-10-CM | POA: Diagnosis not present

## 2023-11-27 DIAGNOSIS — I6782 Cerebral ischemia: Secondary | ICD-10-CM | POA: Diagnosis not present

## 2023-11-27 DIAGNOSIS — Z7982 Long term (current) use of aspirin: Secondary | ICD-10-CM | POA: Diagnosis not present

## 2023-11-27 DIAGNOSIS — G9389 Other specified disorders of brain: Secondary | ICD-10-CM | POA: Diagnosis not present

## 2023-11-27 DIAGNOSIS — I10 Essential (primary) hypertension: Secondary | ICD-10-CM | POA: Diagnosis not present

## 2023-11-27 LAB — CBC WITH DIFFERENTIAL/PLATELET
Abs Immature Granulocytes: 0.01 10*3/uL (ref 0.00–0.07)
Basophils Absolute: 0.1 10*3/uL (ref 0.0–0.1)
Basophils Relative: 1 %
Eosinophils Absolute: 0.2 10*3/uL (ref 0.0–0.5)
Eosinophils Relative: 5 %
HCT: 42.5 % (ref 39.0–52.0)
Hemoglobin: 14.1 g/dL (ref 13.0–17.0)
Immature Granulocytes: 0 %
Lymphocytes Relative: 54 %
Lymphs Abs: 2.3 10*3/uL (ref 0.7–4.0)
MCH: 26.9 pg (ref 26.0–34.0)
MCHC: 33.2 g/dL (ref 30.0–36.0)
MCV: 81 fL (ref 80.0–100.0)
Monocytes Absolute: 0.4 10*3/uL (ref 0.1–1.0)
Monocytes Relative: 9 %
Neutro Abs: 1.3 10*3/uL — ABNORMAL LOW (ref 1.7–7.7)
Neutrophils Relative %: 31 %
Platelets: 231 10*3/uL (ref 150–400)
RBC: 5.25 MIL/uL (ref 4.22–5.81)
RDW: 17.1 % — ABNORMAL HIGH (ref 11.5–15.5)
WBC: 4.3 10*3/uL (ref 4.0–10.5)
nRBC: 0 % (ref 0.0–0.2)

## 2023-11-27 LAB — COMPREHENSIVE METABOLIC PANEL WITH GFR
ALT: 17 U/L (ref 0–44)
AST: 21 U/L (ref 15–41)
Albumin: 3.5 g/dL (ref 3.5–5.0)
Alkaline Phosphatase: 72 U/L (ref 38–126)
Anion gap: 10 (ref 5–15)
BUN: 5 mg/dL — ABNORMAL LOW (ref 6–20)
CO2: 25 mmol/L (ref 22–32)
Calcium: 8.6 mg/dL — ABNORMAL LOW (ref 8.9–10.3)
Chloride: 104 mmol/L (ref 98–111)
Creatinine, Ser: 0.88 mg/dL (ref 0.61–1.24)
GFR, Estimated: >60 mL/min (ref >60)
Glucose, Bld: 81 mg/dL (ref 70–99)
Potassium: 4.3 mmol/L (ref 3.5–5.1)
Sodium: 139 mmol/L (ref 135–145)
Total Bilirubin: 0.7 mg/dL (ref 0.0–1.2)
Total Protein: 6.8 g/dL (ref 6.5–8.1)

## 2023-11-27 LAB — PROTIME-INR
INR: 1.1 (ref 0.8–1.2)
Prothrombin Time: 14.8 s (ref 11.4–15.2)

## 2023-11-27 MED ORDER — FOLIC ACID 1 MG PO TABS
1.0000 mg | ORAL_TABLET | Freq: Every day | ORAL | Status: DC
  Administered 2023-11-27: 1 mg via ORAL
  Filled 2023-11-27: qty 1

## 2023-11-27 MED ORDER — ADULT MULTIVITAMIN W/MINERALS CH
1.0000 | ORAL_TABLET | Freq: Every day | ORAL | Status: DC
  Administered 2023-11-27: 1 via ORAL
  Filled 2023-11-27: qty 1

## 2023-11-27 MED ORDER — THIAMINE HCL 100 MG/ML IJ SOLN: 100.0000 mg | Freq: Every day | INTRAMUSCULAR | Status: DC

## 2023-11-27 MED ORDER — THIAMINE MONONITRATE 100 MG PO TABS
100.0000 mg | ORAL_TABLET | Freq: Every day | ORAL | Status: DC
  Administered 2023-11-27: 100 mg via ORAL
  Filled 2023-11-27: qty 1

## 2023-11-27 MED ORDER — SODIUM CHLORIDE 0.9 % IV BOLUS
1000.0000 mL | Freq: Once | INTRAVENOUS | Status: AC
  Administered 2023-11-27: 1000 mL via INTRAVENOUS

## 2023-11-27 NOTE — ED Provider Notes (Signed)
 Assumed care of patient from off-going team. For more details, please see note from same day.  In brief, this is a 63 y.o. male who p/w dizziness, broad-based gait and orthostasis. CTH reassuring, labs reassuring. Given thiamine /folate. No visual sxs.   Plan/Dispo at time of sign-out & ED Course since sign-out: [ ]  MRI  BP (!) 154/113   Pulse 61   Temp 97.7 F (36.5 C) (Oral)   Resp 19   SpO2 100%    ED Course:   Clinical Course as of 11/27/23 2022  Wed Nov 27, 2023  1942 MR BRAIN WO CONTRAST No acute intracranial abnormality.  Remote infarcts and chronic microvascular ischemic changes similar to prior.  Similar volume loss and T2/FLAIR signal abnormality in the left hippocampus.   [HN]  2020 Patient reevaluated. Informed of his MRI findings. Advised to f/u with his neurologist within 1-2 weeks. His home care RN at bedside made aware as well. Patient states his sxs have improved. I counseled him on alcohol cessation. Patient with no signs of alcohol withdrawal at this time. Stable for DC. DC w/ discharge instructions/return precautions. All questions answered to patient's satisfaction.   [HN]    Clinical Course User Index [HN] Franklyn Sid SAILOR, MD    Dispo: DC ------------------------------- Sid Franklyn, MD Emergency Medicine  This note was created using dictation software, which may contain spelling or grammatical errors.   Franklyn Sid SAILOR, MD 11/27/23 2022

## 2023-11-27 NOTE — ED Provider Notes (Signed)
 [place] [facility]ENC[facility] AT [facility] Provider Note   CSN: [id number] Arrival date & time: 11/27/23  1349     Patient presents with: Dizziness   Arthur [name] is a 63 y.o. male.    Dizziness   Patient presents due to vertigo.  Patient states he woke up this morning from sleep and subsequent felt vertiginous in nature.  Feels like his get him passed out whenever he does get up and walking around.  Patient feels the room is spinning when he is up walking around.  Feels like it is worse when moving his head.  Patient denies any obvious diplopia.  No numbness or tingling aware.  Denies any recent falls that he is aware of.  Patient does state that he drinks about 12 beers per day.  Last drink was yesterday.  No other drugs.  Denies all other complaints.  Denies any chest pain or shortness of breath.  No nausea vomit diarrhea.   Family at bedside, when he first woke up this morning he was having difficulty walking in terms of running to things.  Feels like this is somewhat improved.  Feels like maybe his speech was somewhat off this morning but subsequently back at baseline. Previous medical history reviewed : Patient was last seen in the ED in June 2024.  Present because of loss of consciousness.  Has been admitted in the past in the setting of seizures.  Prior to Admission medications   Medication Sig Start Date End Date Taking? Authorizing Provider  aspirin  EC 81 MG tablet Take 1 tablet (81 mg total) by mouth daily. Swallow whole. 07/11/21   [name], [name], PA-C  atorvastatin  (LIPITOR ) 40 MG tablet TAKE 1 TABLET (40 MG TOTAL) BY MOUTH DAILY (PM) 10/31/23   Patwardhan, Manish J, MD  Blood Pressure Monitor KIT Use to check blood pressure dailly. 10/19/21   Tobie Litter, MD  cholecalciferol  (VITAMIN D ) 25 MCG (1000 UNIT) tablet Take by mouth. 09/07/21     empagliflozin  (JARDIANCE ) 10 MG TABS tablet Take 1 tablet (10 mg total) by mouth daily before breakfast. 08/08/22    Patwardhan, Manish J, MD  lacosamide  (VIMPAT ) 200 MG TABS tablet TAKE 1 TABLET BY MOUTH 2 (TWO) TIMES DAILY.(AM+PM) 05/16/23   Sater, Charlie DELENA, MD  levETIRAcetam  (KEPPRA ) 750 MG tablet TAKE 1 TABLET BY MOUTH 2 (TWO) TIMES DAILY (AM+PM) 11/01/23   Sater, Charlie DELENA, MD  metoprolol  succinate (TOPROL -XL) 100 MG 24 hr tablet TAKE 1/2 TABLET BY MOUTH DAILY WITH OR IMMEDIATELY FOLLOWING A MEAL (BEDTIME) 10/31/23   Patwardhan, Manish J, MD  sacubitril -valsartan  (ENTRESTO ) 49-51 MG TAKE 1 TABLET BY MOUTH 2 (TWO) TIMES DAILY (AM+PM) 10/31/23   Patwardhan, Newman PARAS, MD  sertraline  (ZOLOFT ) 50 MG tablet Take 1 tablet (50 mg total) by mouth daily. 08/28/23 08/27/24  Harrie Bruckner, DO    Allergies: Penicillins    Review of Systems  Neurological:  Positive for dizziness.    Updated Vital Signs BP (!) 160/107 (BP Location: Right Arm)   Pulse (!) 58   Temp 97.7 F (36.5 C) (Oral)   Resp 16   SpO2 100%   Physical Exam Vitals and nursing note reviewed.  Constitutional:      General: He is not in acute distress.    Appearance: He is well-developed.  HENT:     Head: Normocephalic and atraumatic.  Eyes:     Conjunctiva/sclera: Conjunctivae normal.  Cardiovascular:     Rate and Rhythm: Normal rate and regular  rhythm.     Heart sounds: No murmur heard. Pulmonary:     Effort: Pulmonary effort is normal. No respiratory distress.     Breath sounds: Normal breath sounds.  Abdominal:     Palpations: Abdomen is soft.     Tenderness: There is no abdominal tenderness.  Musculoskeletal:        General: No swelling.     Cervical back: Neck supple.  Skin:    General: Skin is warm and dry.     Capillary Refill: Capillary refill takes less than 2 seconds.  Neurological:     Mental Status: He is alert and oriented to person, place, and time.  Psychiatric:        Mood and Affect: Mood normal.     (all labs ordered are listed, but only abnormal results are displayed) Labs Reviewed  COMPREHENSIVE  METABOLIC PANEL WITH GFR  CBC WITH DIFFERENTIAL/PLATELET  PROTIME-INR    EKG: EKG Interpretation Date/Time:  Wednesday November 27 2023 14:59:14 EDT Ventricular Rate:  69 PR Interval:  190 QRS Duration:  88 QT Interval:  437 QTC Calculation: 469 R Axis:   21  Text Interpretation: Sinus rhythm Consider left atrial enlargement Confirmed by Franklyn Gills [phone]) on 11/27/2023 4:22:58 PM  Radiology: CT Head Wo Contrast Result Date: 11/27/2023 CLINICAL DATA:  Fall, vertigo. EXAM: CT HEAD WITHOUT CONTRAST TECHNIQUE: Contiguous axial images were obtained from the base of the skull through the vertex without intravenous contrast. RADIATION DOSE REDUCTION: This exam was performed according to the departmental dose-optimization program which includes automated exposure control, adjustment of the mA and/or kV according to patient size and/or use of iterative reconstruction technique. COMPARISON:  11/10/2022 FINDINGS: Brain: No intracranial hemorrhage, mass effect, or midline shift. No hydrocephalus. Stable atrophy and periventricular chronic small vessel ischemia. Remote left parietal infarct, unchanged. Remote lacunar infarcts in the right internal capsule. No evidence of territorial infarct or acute ischemia. No extra-axial or intracranial fluid collection. Vascular: Atherosclerosis of skullbase vasculature without hyperdense vessel or abnormal calcification. Skull: No fracture or focal lesion. Sinuses/Orbits: No acute fracture. Other: Mild left supraorbital scalp hematoma. IMPRESSION: 1. Mild left supraorbital scalp hematoma. No acute intracranial abnormality. No skull fracture. 2. Stable atrophy, chronic small vessel ischemia, and remote infarcts. Electronically Signed   By: Andrea Gasman M.D.   On: 11/27/2023 15:57     Procedures   Medications Ordered in the ED  thiamine  (VITAMIN B1) tablet 100 mg (100 mg Oral Given 11/27/23 1429)    Or  thiamine  (VITAMIN B1) injection 100 mg ( Intravenous See  Alternative 11/27/23 1429)  folic acid  (FOLVITE ) tablet 1 mg (1 mg Oral Given 11/27/23 1428)  multivitamin with minerals tablet 1 tablet (1 tablet Oral Given 11/27/23 1428)  sodium chloride  0.9 % bolus 1,000 mL (1,000 mLs Intravenous New Bag/Given 11/27/23 1430)                      NIH Stroke Scale: 0              Medical Decision Making Amount and/or Complexity of Data Reviewed Labs: ordered. Radiology: ordered.  Risk OTC drugs. Prescription drug management.    Patient presents due to vertigo.  Patient states he woke up this morning from sleep and subsequent felt vertiginous in nature.  Feels like his get him passed out whenever he does get up and walking around.  Patient feels the room is spinning when he is up walking around.  Feels like it is  worse when moving his head.  Patient denies any obvious diplopia.  No numbness or tingling aware.  Denies any recent falls that he is aware of.  Patient does state that he drinks about 12 beers per day.  Last drink was yesterday.  No other drugs.  Denies all other complaints.  Denies any chest pain or shortness of breath.  No nausea vomit diarrhea.    Previous medical history reviewed : Patient was last seen in the ED in June 2024.  Present because of loss of consciousness.  Has been admitted in the past in the setting of seizures.   Family at bedside, when he first woke up this morning he was having difficulty walking in terms of running to things.  Feels like this is somewhat improved.  Feels like maybe his speech was somewhat off this morning but subsequently back at baseline.  On exam, patient in no acute distress.  Alert oriented x 3.  GCS 15.  NIH 0.  Cranials 2 through 12 intact.  Romberg negative.  Did endorse some vertigo when walking but otherwise normal finger-nose. He did have some broad gait.    Given concern for about possible slurring of speech earlier as well as some concern about his walking ability, obtain CT head.  Will also  obtain MRI brain assessment, CVA.  Patient is well outside the window for any kind of TNK administration.  Currently, his NIH is 0.  No concern for any kind of large vessel occlusion at this point time.   Speaking to the family, they felt like maybe had some difficulty walking as well as talking earlier today.  Will obtain MRI brain  Following CT scan.   Patient signed out in stable condition pending MRI brain.         Final diagnoses:  Vertigo    ED Discharge Orders     None          Simon Lavonia SAILOR, MD 11/27/23 [phone]

## 2023-11-27 NOTE — ED Notes (Signed)
 CCMD called to admit the patient for cardiac monitoring.

## 2023-11-27 NOTE — Progress Notes (Signed)
 CSW added substance abuse resources to patient's AVS.  Edwin Dada, MSW, LCSW Transitions of Care  Clinical Social Worker II [phone]

## 2023-11-27 NOTE — Discharge Instructions (Addendum)
 Thank you for coming to [facility] Emergency Department. Please follow up with your neurologist within 1-2 weeks.  Please decrease or stop your drinking.   Please follow up with your primary care provider within 1 week.   Do not hesitate to return to the ED or call 911 if you experience: -Worsening symptoms -Lightheadedness, passing out -Fevers/chills -Anything else that concerns you                                     Outpatient Substance Abuse  Treatment- uninsured  Narcotics Anonymous 24-HOUR HELPLINE Pre-recorded for Meeting Schedules [facility] [phone]  [email or web address] ALCOHOLICS ANONYMOUS  [place] [facility]   Answering Service [phone] Please Note: All [place] Meetings are Non-smoking [email or web address].es  Alcohol and Drug Services -  Insurance: Medicaid /State funding/private insurance Methadone, suboxone/Intensive outpatient  [place]   [phone] Fax: [phone] [address] [place], [place], [address] [place] [phone] Fax: [phone]    [address], [place], [place], [address] ([address] [place], [place], [place], [place], [place], [place], [place], [place]) [facility] [email or web address] Accepts State funding/Medicaid Transitional housing, Intensive Outpatient Treatment, Outpatient treatment, Veterans Services  Phone: [phone] Fax: [phone] Address: [address] [place] [address]   [employer] of Care ([email or web address]://[email or web address].[email or web address]/) Insurance: Medicaid Case Management, Administrator, arts, Medication Management, Outpatient Therapy, Psychosocial Rehabilitation, Substance Abuse Intensive Outpatient  Phone: [phone] Fax: [phone] [address], [place], [place], [address]  [facility] Medicaid, most private insurance providers Types of Program: Individual/Group Therapy, Substance Abuse Treatment  Phone: [place] [phone] Fax: [phone]  [address], [place], [place], [address] [place] [phone] [address] [place], [place], [address]  [facility]  Medicaid Types of Program: SAIOP  Phone: [phone] Fax: [phone] [address] [place], [place], [place], [address] [facility] Medicaid/state funds Crisis line [phone] [place] [phone] [place] [phone] [place] [phone]  [facility] [address];  [place], [place] [address] [phone]  Substance Abuse Intensive Outpatient Program [facility] [address] [place], [place] [address] [phone]- Substance abuse treatment  [facility]  Insurance: [facility], [address], sliding scale Types of Program: substance abuse treatment, transportation assistance Phone: [phone] Fax: [phone] Address: [address], [place] [address] [facility] (TrendSwap.ch) Insurance: UHC, [place], [place], [place] of [place] Program: addiction counseling, detoxification,  Phone: [phone]  Fax: [phone] Address: [address] [place], [place] [address]  [facility] (statewide facilities/programs) [address] (Medicaid/state funds) [place], [place] [address]                      http://barrett.[email or web address]/ [phone] [place]- [phone] [place]- [phone] [facility] [place] (2 Locations) (Medicaid/state funds) --[address]  walk in 8:30-12 and 1-2:30 [place], [id number]   [facility]- [phone] --[address] [place], [place] [address]  [id number] [phone] walk in 8:30-12 and 2-3:30  [facility] state funds/medicaid [address] [place], [place] [address] [phone] [facility] (Suboxone clinic) Medicaid/state funds  [address]  [place], [place] [address] [phone]   [facility]  [address], [place], [place]  [address]  [phone] (24 hours) [place]- [address] [place]  [place], [place] [address]  [phone] (24 hours) [place]- [address] [place] [phone] [place]- [address] [place] [phone] [place] [address] [place] [phone] [facility]- Medicaid and state funds  [place]- [address] [place], [place] [address] [phone] (24 hours) [place]- [address] [place], [place] [address] [phone] [facility]- [address] [place], [place] [address] [phone] (24 hours) [place] [address] [place], [place]  [address] [phone] [place]- [address] [facility] [address]  [phone]

## 2023-11-27 NOTE — ED Notes (Signed)
 Facilities contacted to decrease the room temperature.

## 2023-11-27 NOTE — ED Triage Notes (Signed)
 BIB [facility] from home, ems was called out for possible sz or stroke, patient states he has been drinking beer and liquor all night. Last drink was 0800 this morning. LWK was last night 2130. Patient endorses dizziness, denies headache, nausea, vomiting   BP 170/98 HR 56 100% RA Cbg 98

## 2023-12-03 ENCOUNTER — Other Ambulatory Visit: Payer: Self-pay | Admitting: Cardiology

## 2023-12-03 ENCOUNTER — Other Ambulatory Visit: Payer: Self-pay | Admitting: Neurology

## 2023-12-03 DIAGNOSIS — R569 Unspecified convulsions: Secondary | ICD-10-CM

## 2023-12-03 DIAGNOSIS — I10 Essential (primary) hypertension: Secondary | ICD-10-CM

## 2023-12-03 DIAGNOSIS — R4189 Other symptoms and signs involving cognitive functions and awareness: Secondary | ICD-10-CM | POA: Diagnosis not present

## 2023-12-03 NOTE — Telephone Encounter (Signed)
 Last seen on 01/23/22 No follow up scheduled   Pt was scheduled on 10/02/23 with [name], but canceled.

## 2023-12-03 NOTE — Patient Instructions (Signed)

## 2023-12-03 NOTE — Telephone Encounter (Signed)
 Called pt at [phone] to try and schedule appt since he has not been seen since 2023. Mailbox full, unable to LVM.  Called wife, Arthur [name] at [phone] (on [place]). Reports pt out of medication as of today. Scheduled f/u for tomorrow at 930am w/ AL,NP. Wife reports he went to hospital 11/27/23 for vertigo, MRI saw stroke. I advised we will leave appt as is and I will talk with Amy. If we need to change for any reason, I will call back. She verbalized understanding.   I spoke w/ Amy who agreed to see pt. Ok to send in 15 days supply of medication and note he must f/u for continued refills.

## 2023-12-03 NOTE — Progress Notes (Unsigned)
 No chief complaint on file.   HISTORY OF PRESENT ILLNESS:  12/03/23 ALL:  Arthur [name] is a 63 y.o. male here today for follow up for seizures. He was last seen by Dr Margaret 12/2021 and stable on levetiracetam  750mg  and Vimpat  200mg  BID.   Since,   Alcohol?   He was seen in the ER 11/27/2023 after waking up feeling dizzy. He reported feeling as if he was going to pass out and that room was spinning when walking. MRI showed remote infarcts and chronic microvascular ischemic changes similar to prior imaging 02/2021. He continues aspirin  and atorvastatin . Followed by Dr Tobie, PCP.   HISTORY (copied from Dr Chancy previous note)  UPDATE (01/23/22, VRP): Since last visit, went to hospital in Sept - Oct 2022 for focal status epilepticus (focal right-sided twitching, right gaze deviation and not following commands). Now on LEV and vimpat  and doing well.    PRIOR HPI (01/18/21): 63 year old male here for evaluation of seizure/encephalopathy.  History of alcohol abuse and prior left temporoparietal stroke.  Patient presented to the hospital for new onset of facial droop and slurred speech.  Code stroke was activated.  IV tPA was started and patient began to have shivering and blood in his mouth.  Seizure was suspected.  tPA was stopped and patient was given Ativan  4 mg for possible seizure versus alcohol withdrawal.  Continuous EEG was performed.  Electrographic seizures were noted averaging 3 to 4/h lasting 20 seconds.  These seemed to arise from left frontotemporal region.  Patient was started on antiseizure medication.  He was stabilized and discharged on levetiracetam  500 mg twice a day and Vimpat  50 mg twice a day.   Since discharge patient is doing well and no further events.  Tolerating medications.   REVIEW OF SYSTEMS: Out of a complete 14 system review of symptoms, the patient complains only of the following symptoms, and all other reviewed systems are  negative.   ALLERGIES: Allergies  Allergen Reactions   Penicillins Hives    Did it involve swelling of the face/tongue/throat, SOB, or low BP? Y Did it involve sudden or severe rash/hives, skin peeling, or any reaction on the inside of your mouth or nose? N Did you need to seek medical attention at a hospital or doctor's office? Y When did it last happen?  Over 5 Years Ago     If all above answers are "NO", may proceed with cephalosporin use.      HOME MEDICATIONS: Outpatient Medications Prior to Visit  Medication Sig Dispense Refill   aspirin  EC 81 MG tablet Take 1 tablet (81 mg total) by mouth daily. Swallow whole. 90 tablet 3   atorvastatin  (LIPITOR ) 40 MG tablet TAKE 1 TABLET (40 MG TOTAL) BY MOUTH DAILY (PM) 30 tablet 0   Blood Pressure Monitor KIT Use to check blood pressure dailly. 1 kit 0   cholecalciferol  (VITAMIN D ) 25 MCG (1000 UNIT) tablet Take by mouth. 90 tablet 3   empagliflozin  (JARDIANCE ) 10 MG TABS tablet Take 1 tablet (10 mg total) by mouth daily before breakfast. 90 tablet 3   lacosamide  (VIMPAT ) 200 MG TABS tablet TAKE 1 TABLET BY MOUTH 2 (TWO) TIMES DAILY.(AM+PM) 60 tablet 5   levETIRAcetam  (KEPPRA ) 750 MG tablet TAKE 1 TABLET BY MOUTH 2 (TWO) TIMES DAILY (AM+PM) 30 tablet 0   metoprolol  succinate (TOPROL -XL) 100 MG 24 hr tablet TAKE 1/2 TABLET BY MOUTH DAILY WITH OR IMMEDIATELY FOLLOWING A MEAL (BEDTIME) 15 tablet 0  sacubitril -valsartan  (ENTRESTO ) 49-51 MG TAKE 1 TABLET BY MOUTH 2 (TWO) TIMES DAILY (AM+PM) 60 tablet 0   sertraline  (ZOLOFT ) 50 MG tablet Take 1 tablet (50 mg total) by mouth daily. 30 tablet 2   No facility-administered medications prior to visit.     PAST MEDICAL HISTORY: Past Medical History:  Diagnosis Date   DM2 (diabetes mellitus, type 2) (HCC)    ETOH abuse    Hypertension    Seizures (HCC)    Transaminitis      PAST SURGICAL HISTORY: Past Surgical History:  Procedure Laterality Date   CORONARY PRESSURE/FFR STUDY N/A  06/27/2021   Procedure: INTRAVASCULAR PRESSURE WIRE/FFR STUDY;  Surgeon: Elmira Newman PARAS, MD;  Location: [facility];  Service: Cardiovascular;  Laterality: N/A;   HEMORRHOID SURGERY     LEFT HEART CATH AND CORONARY ANGIOGRAPHY N/A 06/27/2021   Procedure: LEFT HEART CATH AND CORONARY ANGIOGRAPHY;  Surgeon: Elmira Newman PARAS, MD;  Location: [facility];  Service: Cardiovascular;  Laterality: N/A;     FAMILY HISTORY: Family History  Problem Relation Age of Onset   Diabetes Mother    Hypertension Mother    Diabetes Father    Hypertension Father    Hypertension Sister      SOCIAL HISTORY: Social History   Socioeconomic History   Marital status: Married    Spouse name: Dedra   Number of children: 6   Years of education: Not on file   Highest education level: Not on file  Occupational History   Not on file  Tobacco Use   Smoking status: Some Days    Types: Cigars   Smokeless tobacco: Never   Tobacco comments:    Smokes black & mild - 2 cigars a day.  Vaping Use   Vaping status: Never Used  Substance and Sexual Activity   Alcohol use: Not Currently   Drug use: Never   Sexual activity: Not on file  Other Topics Concern   Not on file  Social History Narrative   Right handed   Lives at home with wife    2-3 cups of cadd per day    Social Drivers of Health   Financial Resource Strain: Low Risk  (05/17/2023)   Overall Financial Resource Strain (CARDIA)    Difficulty of Paying Living Expenses: Not very hard  Food Insecurity: Food Insecurity Present (03/08/2023)   Hunger Vital Sign    Worried About Running Out of Food in the Last Year: Often true    Ran Out of Food in the Last Year: Often true  Transportation Needs: No Transportation Needs (01/07/2023)   PRAPARE - Administrator, Civil Service (Medical): No    Lack of Transportation (Non-Medical): No  Physical Activity: Insufficiently Active (08/21/2022)   Exercise Vital Sign    Days of  Exercise per Week: 7 days    Minutes of Exercise per Session: 10 min  Stress: No Stress Concern Present (01/07/2023)   [employer] of Occupational Health - Occupational Stress Questionnaire    Feeling of Stress : Not at all  Social Connections: Socially Integrated (07/18/2023)   Social Connection and Isolation Panel    Frequency of Communication with Friends and Family: More than three times a week    Frequency of Social Gatherings with Friends and Family: More than three times a week    Attends Religious Services: More than 4 times per year    Active Member of Clubs or Organizations: Yes    Attends  Club or Organization Meetings: More than 4 times per year    Marital Status: Married  Catering manager Violence: Not At Risk (12/07/2022)   Humiliation, Afraid, Rape, and Kick questionnaire    Fear of Current or Ex-Partner: No    Emotionally Abused: No    Physically Abused: No    Sexually Abused: No     PHYSICAL EXAM  There were no vitals filed for this visit. There is no height or weight on file to calculate BMI.  Generalized: Well developed, in no acute distress  Cardiology: normal rate and rhythm, no murmur auscultated  Respiratory: clear to auscultation bilaterally    Neurological examination  Mentation: Alert oriented to time, place, history taking. Follows all commands speech and language fluent Cranial nerve II-XII: Pupils were equal round reactive to light. Extraocular movements were full, visual field were full on confrontational test. Facial sensation and strength were normal. Uvula tongue midline. Head turning and shoulder shrug  were normal and symmetric. Motor: The motor testing reveals 5 over 5 strength of all 4 extremities. Good symmetric motor tone is noted throughout.  Sensory: Sensory testing is intact to soft touch on all 4 extremities. No evidence of extinction is noted.  Coordination: Cerebellar testing reveals good finger-nose-finger and heel-to-shin  bilaterally.  Gait and station: Gait is normal. Tandem gait is normal. Romberg is negative. No drift is seen.  Reflexes: Deep tendon reflexes are symmetric and normal bilaterally.    DIAGNOSTIC DATA (LABS, IMAGING, TESTING) - I reviewed patient records, labs, notes, testing and imaging myself where available.  Lab Results  Component Value Date   WBC 4.3 11/27/2023   HGB 14.1 11/27/2023   HCT 42.5 11/27/2023   MCV 81.0 11/27/2023   PLT 231 11/27/2023      Component Value Date/Time   NA 139 11/27/2023 1545   NA 145 (H) 07/17/2023 1118   K 4.3 11/27/2023 1545   CL 104 11/27/2023 1545   CO2 25 11/27/2023 1545   GLUCOSE 81 11/27/2023 1545   BUN 5 (L) 11/27/2023 1545   BUN 12 07/17/2023 1118   CREATININE 0.88 11/27/2023 1545   CALCIUM  8.6 (L) 11/27/2023 1545   PROT 6.8 11/27/2023 1545   PROT 7.4 02/25/2023 1211   ALBUMIN 3.5 11/27/2023 1545   ALBUMIN 4.6 02/25/2023 1211   AST 21 11/27/2023 1545   ALT 17 11/27/2023 1545   ALKPHOS 72 11/27/2023 1545   BILITOT 0.7 11/27/2023 1545   BILITOT 0.6 02/25/2023 1211   GFRNONAA >60 11/27/2023 1545   GFRAA 95 01/11/2020 0959   Lab Results  Component Value Date   CHOL 109 02/25/2023   HDL 66 02/25/2023   LDLCALC 33 02/25/2023   LDLDIRECT 124.3 (H) 10/17/2020   TRIG 32 02/25/2023   CHOLHDL 1.7 02/25/2023   Lab Results  Component Value Date   HGBA1C 5.8 (A) 07/17/2023   Lab Results  Component Value Date   VITAMINB12 269 09/05/2021   Lab Results  Component Value Date   TSH 1.080 09/05/2021        No data to display               No data to display           ASSESSMENT AND PLAN  63 y.o. year old male  has a past medical history of DM2 (diabetes mellitus, type 2) (HCC), ETOH abuse, Hypertension, Seizures (HCC), and Transaminitis. here with    Seizures (HCC)  History of cerebrovascular accident (CVA) with residual deficit  [name] ***.  Healthy lifestyle habits encouraged. *** will follow up with  PCP as directed. *** will return to see me in ***, sooner if needed. *** verbalizes understanding and agreement with this plan.   No orders of the defined types were placed in this encounter.    No orders of the defined types were placed in this encounter.    Greig Forbes, MSN, FNP-C 12/03/2023, 4:03 PM  [facility] [address] [place], [place] [address] [phone]

## 2023-12-04 ENCOUNTER — Encounter: Payer: Self-pay | Admitting: Family Medicine

## 2023-12-04 ENCOUNTER — Ambulatory Visit: Admitting: Family Medicine

## 2023-12-04 ENCOUNTER — Telehealth: Payer: Self-pay

## 2023-12-04 VITALS — BP 115/73 | HR 62 | Ht 63.0 in | Wt 129.0 lb

## 2023-12-04 DIAGNOSIS — R569 Unspecified convulsions: Secondary | ICD-10-CM

## 2023-12-04 DIAGNOSIS — I6930 Unspecified sequelae of cerebral infarction: Secondary | ICD-10-CM | POA: Diagnosis not present

## 2023-12-04 DIAGNOSIS — R4189 Other symptoms and signs involving cognitive functions and awareness: Secondary | ICD-10-CM | POA: Diagnosis not present

## 2023-12-04 MED ORDER — LACOSAMIDE 200 MG PO TABS: 200.0000 mg | ORAL_TABLET | Freq: Two times a day (BID) | ORAL | 3 refills | Status: DC

## 2023-12-04 MED ORDER — LEVETIRACETAM 750 MG PO TABS: 750.0000 mg | ORAL_TABLET | Freq: Two times a day (BID) | ORAL | 3 refills | Status: DC

## 2023-12-05 DIAGNOSIS — R4189 Other symptoms and signs involving cognitive functions and awareness: Secondary | ICD-10-CM | POA: Diagnosis not present

## 2023-12-06 DIAGNOSIS — R3981 Functional urinary incontinence: Secondary | ICD-10-CM | POA: Diagnosis not present

## 2023-12-06 DIAGNOSIS — R269 Unspecified abnormalities of gait and mobility: Secondary | ICD-10-CM | POA: Diagnosis not present

## 2023-12-06 DIAGNOSIS — I639 Cerebral infarction, unspecified: Secondary | ICD-10-CM | POA: Diagnosis not present

## 2023-12-11 DIAGNOSIS — R4189 Other symptoms and signs involving cognitive functions and awareness: Secondary | ICD-10-CM | POA: Diagnosis not present

## 2023-12-12 DIAGNOSIS — R4189 Other symptoms and signs involving cognitive functions and awareness: Secondary | ICD-10-CM | POA: Diagnosis not present

## 2023-12-16 ENCOUNTER — Encounter: Payer: Self-pay | Admitting: Podiatry

## 2023-12-16 ENCOUNTER — Ambulatory Visit (INDEPENDENT_AMBULATORY_CARE_PROVIDER_SITE_OTHER): Admitting: Podiatry

## 2023-12-16 DIAGNOSIS — Q828 Other specified congenital malformations of skin: Secondary | ICD-10-CM

## 2023-12-16 DIAGNOSIS — M79674 Pain in right toe(s): Secondary | ICD-10-CM | POA: Diagnosis not present

## 2023-12-16 DIAGNOSIS — B351 Tinea unguium: Secondary | ICD-10-CM | POA: Diagnosis not present

## 2023-12-16 DIAGNOSIS — M79675 Pain in left toe(s): Secondary | ICD-10-CM

## 2023-12-16 DIAGNOSIS — E1129 Type 2 diabetes mellitus with other diabetic kidney complication: Secondary | ICD-10-CM

## 2023-12-16 DIAGNOSIS — R809 Proteinuria, unspecified: Secondary | ICD-10-CM

## 2023-12-16 DIAGNOSIS — D492 Neoplasm of unspecified behavior of bone, soft tissue, and skin: Secondary | ICD-10-CM

## 2023-12-16 NOTE — Progress Notes (Signed)
This patient presents  to my office for at risk foot care.  This patient requires this care by a professional since this patient will be at risk due to having diabetes.  This patient is unable to cut nails himself since the patient cannot reach his nails.These nails are painful walking and wearing shoes.    He has developed painful callus on both feet which makes it difficult to walk.  He presents to the office with his mother.  This patient presents for at risk foot care today.  General Appearance  Alert, conversant and in no acute stress.  Vascular  Dorsalis pedis and posterior tibial  pulses are palpable  bilaterally.  Capillary return is within normal limits  bilaterally. Temperature is within normal limits  bilaterally.  Neurologic  Senn-Weinstein monofilament wire test within normal limits  bilaterally. Muscle power within normal limits bilaterally.  Nails Thick disfigured discolored nails with subungual debris  from hallux to fifth toes bilaterally. No evidence of bacterial infection or drainage bilaterally.  Orthopedic  No limitations of motion  feet .  No crepitus or effusions noted.  No bony pathology or digital deformities noted.  Skin  normotropic skin with no porokeratosis noted bilaterally.  No signs of infections or ulcers noted.   Callus heels  B/L and sub 5th met right foot.  Onychomycosis  Pain in right toes  Pain in left toes  Consent was obtained for treatment procedures.   Mechanical debridement of nails 1-5  bilaterally performed with a nail nipper.  Filed with dremel without incident. Callus were done as a courtesy.   Return office visit     3 months                 Told patient to return for periodic foot care and evaluation due to potential at risk complications.   Gardiner Barefoot DPM

## 2023-12-19 DIAGNOSIS — R4189 Other symptoms and signs involving cognitive functions and awareness: Secondary | ICD-10-CM | POA: Diagnosis not present

## 2023-12-30 ENCOUNTER — Ambulatory Visit: Admitting: Podiatry

## 2024-01-01 ENCOUNTER — Telehealth: Payer: Self-pay | Admitting: Pharmacy Technician

## 2024-01-01 ENCOUNTER — Other Ambulatory Visit: Payer: Self-pay | Admitting: Cardiology

## 2024-01-01 DIAGNOSIS — I10 Essential (primary) hypertension: Secondary | ICD-10-CM

## 2024-01-01 NOTE — Telephone Encounter (Signed)
   Patient filled today

## 2024-01-01 NOTE — Telephone Encounter (Signed)
 Pharmacy Patient Advocate Encounter   Received notification from CoverMyMeds that prior authorization for entresto  49/51mg  is required/requested.   Insurance verification completed.   The patient is insured through [facility] MEDICAID .   Per test claim: PA required; PA submitted to above mentioned insurance via latent Key/confirmation #/EOC [id number] Status is pending

## 2024-01-02 DIAGNOSIS — R4189 Other symptoms and signs involving cognitive functions and awareness: Secondary | ICD-10-CM | POA: Diagnosis not present

## 2024-01-03 DIAGNOSIS — R4189 Other symptoms and signs involving cognitive functions and awareness: Secondary | ICD-10-CM | POA: Diagnosis not present

## 2024-01-05 DIAGNOSIS — R269 Unspecified abnormalities of gait and mobility: Secondary | ICD-10-CM | POA: Diagnosis not present

## 2024-01-05 DIAGNOSIS — I639 Cerebral infarction, unspecified: Secondary | ICD-10-CM | POA: Diagnosis not present

## 2024-01-05 DIAGNOSIS — R3981 Functional urinary incontinence: Secondary | ICD-10-CM | POA: Diagnosis not present

## 2024-01-06 DIAGNOSIS — R4189 Other symptoms and signs involving cognitive functions and awareness: Secondary | ICD-10-CM | POA: Diagnosis not present

## 2024-01-07 DIAGNOSIS — R4189 Other symptoms and signs involving cognitive functions and awareness: Secondary | ICD-10-CM | POA: Diagnosis not present

## 2024-01-08 DIAGNOSIS — R4189 Other symptoms and signs involving cognitive functions and awareness: Secondary | ICD-10-CM | POA: Diagnosis not present

## 2024-01-09 ENCOUNTER — Telehealth: Payer: Self-pay

## 2024-01-09 DIAGNOSIS — R569 Unspecified convulsions: Secondary | ICD-10-CM

## 2024-01-09 DIAGNOSIS — R4189 Other symptoms and signs involving cognitive functions and awareness: Secondary | ICD-10-CM | POA: Diagnosis not present

## 2024-01-09 NOTE — Progress Notes (Unsigned)
 Complex Care Management Note Care Guide Note  01/09/2024 Name: Arthur [name] MRN: [id number] DOB: 11/11/1960   Complex Care Management Outreach Attempts: An unsuccessful telephone outreach was attempted today to offer the patient information about available complex care management services.  Follow Up Plan:  Additional outreach attempts will be made to offer the patient complex care management information and services.   Encounter Outcome:  No Answer  Dreama [facility]wood [facility]  [facility], [facility] Health Care Management Assistant Direct Dial: [phone]  Fax: [phone]

## 2024-01-10 DIAGNOSIS — R4189 Other symptoms and signs involving cognitive functions and awareness: Secondary | ICD-10-CM | POA: Diagnosis not present

## 2024-01-14 DIAGNOSIS — R4189 Other symptoms and signs involving cognitive functions and awareness: Secondary | ICD-10-CM | POA: Diagnosis not present

## 2024-01-15 DIAGNOSIS — R4189 Other symptoms and signs involving cognitive functions and awareness: Secondary | ICD-10-CM | POA: Diagnosis not present

## 2024-01-16 DIAGNOSIS — R4189 Other symptoms and signs involving cognitive functions and awareness: Secondary | ICD-10-CM | POA: Diagnosis not present

## 2024-01-16 NOTE — Progress Notes (Signed)
 Complex Care Management Note  Care Guide Note 01/16/2024 Name: Arthur [name] MRN: [id number] DOB: [birth year]  Arthur [name] is a 63 y.o. year old male who sees Tobie Gaines, DO for primary care. I reached out to [name] by phone today to offer complex care management services.  [name] was given information about Complex Care Management services today including:   The Complex Care Management services include support from the care team which includes your Nurse Care Manager, Clinical Social Worker, or Pharmacist.  The Complex Care Management team is here to help remove barriers to the health concerns and goals most important to you. Complex Care Management services are voluntary, and the patient may decline or stop services at any time by request to their care team member.   Complex Care Management Consent Status: Patient agreed to services and verbal consent obtained.   Follow up plan:  Telephone appointment with complex care management team member scheduled for:  01/28/24 at 1:00 p.m.   Encounter Outcome:  Patient Scheduled  Dreama Lynwood [facility]  [facility], [facility] VBCI Assistant Direct Dial: [phone]  Fax: [phone]

## 2024-01-17 DIAGNOSIS — R4189 Other symptoms and signs involving cognitive functions and awareness: Secondary | ICD-10-CM | POA: Diagnosis not present

## 2024-01-20 DIAGNOSIS — R4189 Other symptoms and signs involving cognitive functions and awareness: Secondary | ICD-10-CM | POA: Diagnosis not present

## 2024-01-21 DIAGNOSIS — R4189 Other symptoms and signs involving cognitive functions and awareness: Secondary | ICD-10-CM | POA: Diagnosis not present

## 2024-01-22 DIAGNOSIS — R4189 Other symptoms and signs involving cognitive functions and awareness: Secondary | ICD-10-CM | POA: Diagnosis not present

## 2024-01-23 DIAGNOSIS — R4189 Other symptoms and signs involving cognitive functions and awareness: Secondary | ICD-10-CM | POA: Diagnosis not present

## 2024-01-24 DIAGNOSIS — R4189 Other symptoms and signs involving cognitive functions and awareness: Secondary | ICD-10-CM | POA: Diagnosis not present

## 2024-01-27 DIAGNOSIS — R4189 Other symptoms and signs involving cognitive functions and awareness: Secondary | ICD-10-CM | POA: Diagnosis not present

## 2024-01-28 ENCOUNTER — Telehealth: Payer: Self-pay

## 2024-01-28 DIAGNOSIS — R4189 Other symptoms and signs involving cognitive functions and awareness: Secondary | ICD-10-CM | POA: Diagnosis not present

## 2024-01-28 NOTE — Patient Outreach (Signed)
 Care Coordination   01/28/2024 Name: KURK CORNIEL MRN: [id number] DOB: [birth year]   Care Coordination Outreach Attempts:  An unsuccessful outreach was attempted for an appointment today.  Follow Up Plan:  Additional outreach attempts will be made to complete CCM follow-up visit.   Encounter Outcome:  Spoke with patient's wife, Tessa Code. She requests that [facility] call back tomorrow 01/29/24.   Rosaline Finlay, RN MSN [place]  VBCI Population Health RN Care Manager Direct Dial: [phone]  Fax: [phone]

## 2024-01-29 DIAGNOSIS — R4189 Other symptoms and signs involving cognitive functions and awareness: Secondary | ICD-10-CM | POA: Diagnosis not present

## 2024-01-30 DIAGNOSIS — R4189 Other symptoms and signs involving cognitive functions and awareness: Secondary | ICD-10-CM | POA: Diagnosis not present

## 2024-01-30 NOTE — Patient Outreach (Signed)
 Care Coordination   01/30/2024 Name: Arthur [name] MRN: [id number] DOB: [birth year]   Care Coordination Outreach Attempts:  A second unsuccessful outreach was attempted today to complete CCM follow-up visit.  Follow Up Plan:  Additional outreach attempts will be made to complete follow-up visit.   Encounter Outcome:  Spoke with patient's wife Tessa, who states she is not with the patient at this time although she feels he is doing fine. She provides patient's phone number [phone]. Attempted to call this number. No Answer and voicemail box is full.   Rosaline Finlay, RN MSN [place]  VBCI Population Health RN Care Manager Direct Dial: [phone]  Fax: [phone]

## 2024-01-31 DIAGNOSIS — R4189 Other symptoms and signs involving cognitive functions and awareness: Secondary | ICD-10-CM | POA: Diagnosis not present

## 2024-02-03 ENCOUNTER — Other Ambulatory Visit: Payer: Self-pay

## 2024-02-03 ENCOUNTER — Other Ambulatory Visit: Payer: Self-pay | Admitting: Cardiology

## 2024-02-03 DIAGNOSIS — I10 Essential (primary) hypertension: Secondary | ICD-10-CM

## 2024-02-03 DIAGNOSIS — R4189 Other symptoms and signs involving cognitive functions and awareness: Secondary | ICD-10-CM | POA: Diagnosis not present

## 2024-02-03 NOTE — Patient Outreach (Signed)
 Complex Care Management   Visit Note  02/03/2024  Name:  Arthur [name] MRN: [id number] DOB: 18-Dec-1960  Situation: Referral received for Complex Care Management related to seizures I obtained verbal consent from Patient.  Visit completed with Patient  on the phone  Background:   Past Medical History:  Diagnosis Date   DM2 (diabetes mellitus, type 2) (HCC)    ETOH abuse    Hypertension    Seizures (HCC)    Transaminitis     Assessment: Patient Reported Symptoms:  Cognitive Cognitive Status: Able to follow simple commands, Alert and oriented to person, place, and time, Normal speech and language skills Cognitive/Intellectual Conditions Management [RPT]: None reported or documented in medical history or problem list      Neurological Neurological Review of Symptoms: No symptoms reported Neurological Comment: Per patient's wife, no recent seizures  HEENT HEENT Symptoms Reported: No symptoms reported      Cardiovascular Cardiovascular Symptoms Reported: No symptoms reported Does patient have uncontrolled Hypertension?: No Cardiovascular Comment: Patient reports his aide checks his BP when she is there once a day  Respiratory Respiratory Symptoms Reported: No symptoms reported Additional Respiratory Details: Patient reports that he quit smoking    Endocrine Endocrine Symptoms Reported: No symptoms reported Is patient diabetic?: Yes Is patient checking blood sugars at home?: Yes List most recent blood sugar readings, include date and time of day: Patient does not remember. His aide checks when she comes once a day    Gastrointestinal Gastrointestinal Symptoms Reported: No symptoms reported Additional Gastrointestinal Details: Patient reports that his appetite varies. Last BM today      Genitourinary Genitourinary Symptoms Reported: No symptoms reported    Integumentary Integumentary Symptoms Reported: No symptoms reported    Musculoskeletal Musculoskelatal Symptoms Reviewed: No  symptoms reported   Falls in the past year?: No Number of falls in past year: 1 or less Was there an injury with Fall?: No Fall Risk Category Calculator: 0 Patient Fall Risk Level: Low Fall Risk    Psychosocial Psychosocial Symptoms Reported: Depression - if selected complete PHQ 2-9          02/03/2024    PHQ2-9 Depression Screening   Little interest or pleasure in doing things Several days  Feeling down, depressed, or hopeless Not at all  PHQ-2 - Total Score 1  Trouble falling or staying asleep, or sleeping too much Not at all  Feeling tired or having little energy Not at all  Poor appetite or overeating  Not at all  Feeling bad about yourself - or that you are a failure or have let yourself or your family down Not at all  Trouble concentrating on things, such as reading the newspaper or watching television    Moving or speaking so slowly that other people could have noticed.  Or the opposite - being so fidgety or restless that you have been moving around a lot more than usual Not at all  Thoughts that you would be better off dead, or hurting yourself in some way Not at all  PHQ2-9 Total Score 1  If you checked off any problems, how difficult have these problems made it for you to do your work, take care of things at home, or get along with other people    Depression Interventions/Treatment      There were no vitals filed for this visit.  Medications Reviewed Today     Reviewed by Arno Rosaline SQUIBB, RN (Registered Nurse) on 02/03/24 at 1310  Med  List Status: <None>   Medication Order Taking? Sig Documenting Provider Last Dose Status Informant  aspirin  EC 81 MG tablet [id number] Yes Take 1 tablet (81 mg total) by mouth daily. Swallow whole. [name], [name], PA-C  Active   atorvastatin  (LIPITOR ) 40 MG tablet [id number] Yes Take 1 tablet (40 mg total) by mouth daily. (PM) Please call [phone] to schedule an overdue appointment for future refills. Thank you. 2nd attempt.  Elmira Newman PARAS, MD  Active   Blood Pressure Monitor KIT [id number] Yes Use to check blood pressure dailly. Tobie Litter, MD  Active   cholecalciferol  (VITAMIN D ) 25 MCG (1000 UNIT) tablet [id number] Yes Take by mouth.   Active   empagliflozin  (JARDIANCE ) 10 MG TABS tablet [id number] Yes Take 1 tablet (10 mg total) by mouth daily before breakfast. Patwardhan, Newman PARAS, MD  Active   lacosamide  (VIMPAT ) 200 MG TABS tablet [id number] Yes Take 1 tablet (200 mg total) by mouth 2 (two) times daily. Lomax, Amy, NP  Active   levETIRAcetam  (KEPPRA ) 750 MG tablet [id number] Yes Take 1 tablet (750 mg total) by mouth 2 (two) times daily. Lomax, Amy, NP  Active   metoprolol  succinate (TOPROL -XL) 100 MG 24 hr tablet [id number] Yes Take 1/2 tablet by mouth daily with or immediately following a meal (bedtime). Please call [phone] to schedule an overdue appointment for future refills. Thank you. Final attempt. Patwardhan, Newman PARAS, MD  Active   sacubitril -valsartan  (ENTRESTO ) 49-51 MG [id number] Yes Take 1 tablet by mouth 2 (two) times daily. (AM+PM).  Please call [phone] to schedule an overdue appointment for future refills. Thank you. 2nd attempt. Patwardhan, Newman PARAS, MD  Active   sertraline  (ZOLOFT ) 50 MG tablet [id number] Yes Take 1 tablet (50 mg total) by mouth daily. Harrie Bruckner, DO  Active   Med List Note Marisa Nathanel SAILOR, CPhT 02/24/21 1756): Vimpat : 12/08/20 receiving PAP through [employer], approved until 12/08/2022            Recommendation:   Continue Current Plan of Care  Follow Up Plan:   Closing From:  Complex Care Management Patient has met all care management goals. Care Management case will be closed. Patient has been provided contact information should new needs arise.  Patient has met care plan goals. He denies recent seizures, remains complaint with his medications, and has had follow-up with neurology.  Rosaline Finlay, RN MSN [place]  VBCI Population Health RN  Care Manager Direct Dial: [phone]  Fax: [phone]

## 2024-02-03 NOTE — Patient Instructions (Signed)
 Visit Information  Arthur [name] was given information about Medicaid Managed Care team care coordination services as a part of their [facility] Community Plan Medicaid benefit.   If you would like to schedule transportation through your [facility], please call the following number at least 2 days in advance of your appointment: [phone]   Rides for urgent appointments can also be made after hours by calling Member Services.  Call the Behavioral Health Crisis Line at [phone], at any time, 24 hours a day, 7 days a week. If you are in danger or need immediate medical attention call 911.   Arthur [name] - following are the goals we discussed in your visit today:   Goals Addressed             This Visit's Progress    COMPLETED: VBCI RN Care Plan   On track    Problems:  Chronic Disease Management support and education needs related to Seizures  Goal: Over the next 30 days days the Patient will demonstrate Ongoing adherence to prescribed treatment plan for Seizures as evidenced by patient report of no seizures verbalize basic understanding of Seizures disease process and self health management plan as evidenced by verbal explanation lifestyle changes and consistent medication compliance   Interventions:   Evaluation of current treatment plan related to Seizures, self-management and patient's adherence to plan as established by provider. Discussed plans with patient for ongoing care management follow up and provided patient with direct contact information for care management team Reviewed medications with patient and discussed importance of compliance Assessed for symptoms of depression Assessed social determinant of health barriers Reviewed importance of continuing to abstain from tobacco and alcohol use  Patient Self-Care Activities:  Attend all scheduled provider appointments Call pharmacy for medication refills 3-7 days in advance of running out of  medications Call provider office for new concerns or questions  Perform all self care activities independently  Take medications as prescribed    Plan:  No further follow up required: Patient has met care plan goals              Patient verbalizes understanding of instructions and care plan provided today and agrees to view in MyChart. Active MyChart status and patient understanding of how to access instructions and care plan via MyChart confirmed with patient.     No further follow up required: Patient has met care plan goals. He denies recent seizures, remains complaint with his medications, and has had follow-up with neurology.  Rosaline Finlay, RN MSN [place]  VBCI Population Health RN Care Manager Direct Dial: [phone]  Fax: [phone]   Following is a copy of your plan of care:  There are no care plans that you recently modified to display for this patient.

## 2024-02-04 DIAGNOSIS — R4189 Other symptoms and signs involving cognitive functions and awareness: Secondary | ICD-10-CM | POA: Diagnosis not present

## 2024-02-05 DIAGNOSIS — R4189 Other symptoms and signs involving cognitive functions and awareness: Secondary | ICD-10-CM | POA: Diagnosis not present

## 2024-02-06 DIAGNOSIS — R4189 Other symptoms and signs involving cognitive functions and awareness: Secondary | ICD-10-CM | POA: Diagnosis not present

## 2024-02-07 ENCOUNTER — Telehealth: Payer: Self-pay | Admitting: Student

## 2024-02-07 DIAGNOSIS — R4189 Other symptoms and signs involving cognitive functions and awareness: Secondary | ICD-10-CM | POA: Diagnosis not present

## 2024-02-07 NOTE — Telephone Encounter (Signed)
 Rec'd fax from [facility] DELIVERED for [employer] supplies. Called the patient and confirmed he no longer needs supplies.  Per The pt, (I don't use those anymore and I don't need them)  Sending incomplete back to HOME CARE DELIVERED to contact the pt if any questions.  Fax form back to [phone].

## 2024-02-10 ENCOUNTER — Telehealth: Payer: Self-pay

## 2024-02-10 DIAGNOSIS — R4189 Other symptoms and signs involving cognitive functions and awareness: Secondary | ICD-10-CM | POA: Diagnosis not present

## 2024-02-10 NOTE — Progress Notes (Signed)
 Complex Care Management Care Guide Note  02/10/2024 Name: HILLARY SCHWEGLER MRN: [id number] DOB: 25-Nov-1960  CRANDALL HARVEL is a 63 y.o. year old male who is a primary care patient of Tobie Gaines, DO and is actively engaged with the care management team. I reached out to [name] by phone today to assist with scheduling  with the RN Case Manager.  Follow up plan: Per RNCM recent visit 02/03/24, patient has no additional needs at this time. Request will be closed.   Dreama Lynwood [facility]  [facility], [facility] VBCI Assistant Direct Dial: [phone]  Fax: [phone]

## 2024-02-10 NOTE — Progress Notes (Signed)
 Complex Care Management Care Guide Note  02/10/2024 Name: Arthur [name] MRN: [id number] DOB: 30-Jan-1961  Arthur [name] is a 63 y.o. year old male who is a primary care patient of Tobie Gaines, DO and is actively engaged with the care management team. I reached out to Arthur [name] by phone today to assist with re-scheduling  with the RN Case Manager.  Follow up plan: Telephone appointment with complex care management team member scheduled for:  02/11/24 at 10:30 a.m.   Arthur [name]  [facility], [facility] VBCI Assistant Direct Dial: [phone]  Fax: [phone]

## 2024-02-11 ENCOUNTER — Telehealth: Payer: Self-pay

## 2024-02-11 DIAGNOSIS — R4189 Other symptoms and signs involving cognitive functions and awareness: Secondary | ICD-10-CM | POA: Diagnosis not present

## 2024-02-12 DIAGNOSIS — R4189 Other symptoms and signs involving cognitive functions and awareness: Secondary | ICD-10-CM | POA: Diagnosis not present

## 2024-02-13 ENCOUNTER — Telehealth

## 2024-02-13 DIAGNOSIS — R4189 Other symptoms and signs involving cognitive functions and awareness: Secondary | ICD-10-CM | POA: Diagnosis not present

## 2024-02-13 NOTE — Telephone Encounter (Signed)
 Please see prev message:    02/07/2024 [facility] Of [place]. [facility]          02/07/24 10:47 AM Note Rec'd fax from [facility] DELIVERED for [employer] supplies. Called the patient and confirmed he no longer needs supplies.  Per The pt, (I don't use those anymore and I don't need them)   Sending incomplete back to HOME CARE DELIVERED to contact the pt if any questions.   Fax form back to [phone].         Copied from CRM [id number]. Topic: Clinical - Order For Equipment >> Feb 06, 2024 10:48 AM Zane F wrote: Reason for CRM:   Caller: Candelaria  Calling From : [facility] Delivered DME Supplier  Calling in to discuss his incontinence supplies and they have not received verifying documents for his supplies back from the office. Calling to confirm if documents have been received. Original fax was sent over on 01/30/2024. Specialist called over to [facility] to confirm if fax has been received. CAL is requesting the office refax the documents for completion.  Callback Number: [phone] Fax Number: [phone]

## 2024-02-14 DIAGNOSIS — R4189 Other symptoms and signs involving cognitive functions and awareness: Secondary | ICD-10-CM | POA: Diagnosis not present

## 2024-02-17 ENCOUNTER — Ambulatory Visit (INDEPENDENT_AMBULATORY_CARE_PROVIDER_SITE_OTHER): Admitting: Podiatry

## 2024-02-17 ENCOUNTER — Encounter: Payer: Self-pay | Admitting: Podiatry

## 2024-02-17 DIAGNOSIS — R809 Proteinuria, unspecified: Secondary | ICD-10-CM

## 2024-02-17 DIAGNOSIS — B351 Tinea unguium: Secondary | ICD-10-CM | POA: Diagnosis not present

## 2024-02-17 DIAGNOSIS — E1129 Type 2 diabetes mellitus with other diabetic kidney complication: Secondary | ICD-10-CM

## 2024-02-17 DIAGNOSIS — Q828 Other specified congenital malformations of skin: Secondary | ICD-10-CM

## 2024-02-17 DIAGNOSIS — M79674 Pain in right toe(s): Secondary | ICD-10-CM

## 2024-02-17 DIAGNOSIS — R4189 Other symptoms and signs involving cognitive functions and awareness: Secondary | ICD-10-CM | POA: Diagnosis not present

## 2024-02-17 DIAGNOSIS — M79675 Pain in left toe(s): Secondary | ICD-10-CM | POA: Diagnosis not present

## 2024-02-17 NOTE — Progress Notes (Signed)
This patient presents  to my office for at risk foot care.  This patient requires this care by a professional since this patient will be at risk due to having diabetes.  This patient is unable to cut nails himself since the patient cannot reach his nails.These nails are painful walking and wearing shoes.    He has developed painful callus on both feet which makes it difficult to walk.  He presents to the office with his mother.  This patient presents for at risk foot care today.  General Appearance  Alert, conversant and in no acute stress.  Vascular  Dorsalis pedis and posterior tibial  pulses are palpable  bilaterally.  Capillary return is within normal limits  bilaterally. Temperature is within normal limits  bilaterally.  Neurologic  Senn-Weinstein monofilament wire test within normal limits  bilaterally. Muscle power within normal limits bilaterally.  Nails Thick disfigured discolored nails with subungual debris  from hallux to fifth toes bilaterally. No evidence of bacterial infection or drainage bilaterally.  Orthopedic  No limitations of motion  feet .  No crepitus or effusions noted.  No bony pathology or digital deformities noted.  Skin  normotropic skin with no porokeratosis noted bilaterally.  No signs of infections or ulcers noted.   Callus heels  B/L and sub 5th met right foot.  Onychomycosis  Pain in right toes  Pain in left toes  Consent was obtained for treatment procedures.   Mechanical debridement of nails 1-5  bilaterally performed with a nail nipper.  Filed with dremel without incident. Callus were done as a courtesy.   Return office visit     3 months                 Told patient to return for periodic foot care and evaluation due to potential at risk complications.   Gardiner Barefoot DPM

## 2024-02-18 ENCOUNTER — Other Ambulatory Visit: Payer: Self-pay | Admitting: Cardiology

## 2024-02-18 DIAGNOSIS — R4189 Other symptoms and signs involving cognitive functions and awareness: Secondary | ICD-10-CM | POA: Diagnosis not present

## 2024-02-18 DIAGNOSIS — I10 Essential (primary) hypertension: Secondary | ICD-10-CM

## 2024-02-19 DIAGNOSIS — R4189 Other symptoms and signs involving cognitive functions and awareness: Secondary | ICD-10-CM | POA: Diagnosis not present

## 2024-02-20 DIAGNOSIS — R4189 Other symptoms and signs involving cognitive functions and awareness: Secondary | ICD-10-CM | POA: Diagnosis not present

## 2024-02-21 DIAGNOSIS — R4189 Other symptoms and signs involving cognitive functions and awareness: Secondary | ICD-10-CM | POA: Diagnosis not present

## 2024-02-24 DIAGNOSIS — R4189 Other symptoms and signs involving cognitive functions and awareness: Secondary | ICD-10-CM | POA: Diagnosis not present

## 2024-02-25 ENCOUNTER — Ambulatory Visit: Attending: Cardiology | Admitting: Cardiology

## 2024-02-25 ENCOUNTER — Encounter: Payer: Self-pay | Admitting: Cardiology

## 2024-02-25 ENCOUNTER — Other Ambulatory Visit (HOSPITAL_COMMUNITY): Payer: Self-pay

## 2024-02-25 VITALS — BP 150/94 | HR 68 | Ht 63.0 in | Wt 131.0 lb

## 2024-02-25 DIAGNOSIS — R809 Proteinuria, unspecified: Secondary | ICD-10-CM | POA: Insufficient documentation

## 2024-02-25 DIAGNOSIS — I10 Essential (primary) hypertension: Secondary | ICD-10-CM | POA: Insufficient documentation

## 2024-02-25 DIAGNOSIS — F17210 Nicotine dependence, cigarettes, uncomplicated: Secondary | ICD-10-CM | POA: Diagnosis present

## 2024-02-25 DIAGNOSIS — I517 Cardiomegaly: Secondary | ICD-10-CM | POA: Diagnosis not present

## 2024-02-25 DIAGNOSIS — I2510 Atherosclerotic heart disease of native coronary artery without angina pectoris: Secondary | ICD-10-CM | POA: Insufficient documentation

## 2024-02-25 DIAGNOSIS — E1129 Type 2 diabetes mellitus with other diabetic kidney complication: Secondary | ICD-10-CM | POA: Insufficient documentation

## 2024-02-25 MED ORDER — SACUBITRIL-VALSARTAN 49-51 MG PO TABS
1.0000 | ORAL_TABLET | Freq: Two times a day (BID) | ORAL | 3 refills | Status: DC
  Filled 2024-02-25: qty 180, 90d supply, fill #0

## 2024-02-25 MED ORDER — EMPAGLIFLOZIN 10 MG PO TABS
10.0000 mg | ORAL_TABLET | Freq: Every day | ORAL | 3 refills | Status: DC
  Filled 2024-02-25: qty 90, 90d supply, fill #0

## 2024-02-25 MED ORDER — METOPROLOL SUCCINATE ER 50 MG PO TB24
50.0000 mg | ORAL_TABLET | Freq: Every day | ORAL | 3 refills | Status: DC
  Filled 2024-02-25 (×2): qty 90, 90d supply, fill #0

## 2024-02-25 MED ORDER — ATORVASTATIN CALCIUM 40 MG PO TABS
40.0000 mg | ORAL_TABLET | Freq: Every day | ORAL | 3 refills | Status: DC
  Filled 2024-02-25: qty 90, 90d supply, fill #0

## 2024-02-25 MED ORDER — ASPIRIN EC 81 MG PO TBEC
81.0000 mg | DELAYED_RELEASE_TABLET | Freq: Every day | ORAL | 3 refills | Status: AC
  Filled 2024-02-25: qty 90, 90d supply, fill #0

## 2024-02-25 MED ORDER — NICOTINE POLACRILEX 2 MG MT LOZG
2.0000 mg | LOZENGE | OROMUCOSAL | 0 refills | Status: AC | PRN
  Filled 2024-02-25: qty 72, 15d supply, fill #0

## 2024-02-25 NOTE — Progress Notes (Signed)
 Cardiology Office Note:  .   Date:  02/25/2024  ID:  Arthur [name], DOB [birth year], MRN [id number] PCP: Tobie Gaines, DO   HeartCare Providers Cardiologist:  Newman Lawrence, MD PCP: Tobie Gaines, DO  Chief Complaint  Patient presents with   Hypertension     Arthur [name] is a 63 y.o. male with hypertension, type 2 diabetes mellitus, h/o alcohol abuse, nonobstructive CAD, HFrEF, nonischemic cardiomyopathy, seizure disorder.   Discussed the use of AI scribe software for clinical note transcription with the patient, who gave verbal consent to proceed.  History of Present Illness  Patient denies any recent chest pain or shortness of breath symptoms.  He has been out of his hypertension medications for last couple of days.  He continues to smoke about 4 beers, and 2 cigars a day,     Vitals:   02/25/24 1424  BP: (!) 150/94  Pulse: 68  SpO2: 95%      Review of Systems  Cardiovascular:  Negative for chest pain, dyspnea on exertion, leg swelling, palpitations and syncope.        Studies Reviewed: SABRA        EKG 11/27/2023: Sinus rhythm 69 bpm Cannot exclude old anteroseptal infarct  Echocardiogram 12/2021:  Normal LV systolic function with visual EF 60-65%. Left ventricle size is  decreased. Severe concentric hypertrophy of the left ventricle. Normal  global wall motion. Doppler evidence of grade I (impaired) diastolic  dysfunction, normal LAP. Calculated EF 66%.  Left atrial cavity is normal in size. 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  Right ventricle cavity is normal in size. Mild concentric hypertrophy of  the right ventricle. Normal right ventricular function.  Structurally normal trileaflet aortic valve.  Trace aortic regurgitation.  Structurally normal tricuspid valve.  Mild tricuspid regurgitation. No  evidence of pulmonary hypertension.  no prior available for comparison    Coronary angiogram 05/2021: Nonobstructive  CAD in LAD and RCA Unable to perform OM RFR evaluation due to severe angulated Lcx takeoff Severe disease in medium sized diag 2. This alone does not explain his cardiomyopathy.  There is no obstructive disease in basal inferior wall, which is usually supplied by RCA.   He doe snot have any angina symptoms at this time. Dyspnea is mild. 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 I do not think this will change with diag revascularization at this time.    Recommend continued management of HFrEF. Change lisinopril  20 mg to Entresto  49-51 mg bid. If EF remains low in spite of optimal GDMT for heart failure, and if dyspnea persists, could then consider diag revascularization for possible angina equivalent dyspnea.   Labs 11/2023: HbA1C 5.8% Hb 14.1 Cr 0.88  01/2023: Chol 109, TG 32, HDL 66, LDL 33   Physical Exam Vitals and nursing note reviewed.  Constitutional:      General: He is not in acute distress. Neck:     Vascular: No JVD.  Cardiovascular:     Rate and Rhythm: Normal rate and regular rhythm.     Heart sounds: Normal heart sounds. No murmur heard. Pulmonary:     Effort: Pulmonary effort is normal.     Breath sounds: Normal breath sounds. No wheezing or rales.  Musculoskeletal:     Right lower leg: No edema.     Left lower leg: No edema.      VISIT DIAGNOSES:   ICD-10-CM   1. LVH (left ventricular hypertrophy)  I51.7 ECHOCARDIOGRAM COMPLETE    2. Essential hypertension  I10 atorvastatin  (LIPITOR ) 40 MG tablet    ECHOCARDIOGRAM COMPLETE    3. Controlled type 2 diabetes mellitus with microalbuminuria, without long-term current use of insulin  (HCC)  E11.29 empagliflozin  (JARDIANCE ) 10 MG TABS tablet   R80.9     4. Coronary artery disease involving native coronary artery of native heart without angina pectoris  I25.10 Basic metabolic panel with GFR    Pro b natriuretic peptide (BNP)    Lipid panel    Lipid panel    Pro b natriuretic  peptide (BNP)    Basic metabolic panel with GFR       Arthur [name] is a 63 y.o. male with hypertension, type 2 diabetes mellitus, h/o alcohol abuse, nonobstructive CAD, HFrEF, nonischemic cardiomyopathy, seizure disorder.   Assessment & Plan HFrEF: Euvolemic. EF recovered on echocardiogram (12/2021). Continue current GDMT for HFrEF.  He has been out of medications for last few days. Refilled Entresto  49-51 mg bid, metoprolol  succinate 50 mg daily, Jardiance  10 mg daily. Cautioned against alcohol use.  CAD: Nonobstrcutvie. Continue Aspirin , statin   Hypertension: Recent noncompliance.  Refill cardiac medications.   Also on amlodipine  10 mg daily in additin to above meds.    Nicotine  dependence: Tobacco cessation counseling:  - Currently smoking 2 cigars/day   - Patient was informed of the dangers of tobacco abuse including stroke, cancer, and MI, as well as benefits of tobacco cessation. - Patient is willing to quit at this time. - Approximately 5 mins were spent counseling patient cessation techniques. We discussed various methods to help quit smoking, including deciding on a date to quit, joining a support group, pharmacological agents. Patient would like to use nicotine  lozenge. - I will reassess his progress at the next follow-up visit    No orders of the defined types were placed in this encounter.    F/u in 3 months  Signed, Newman JINNY Lawrence, MD

## 2024-02-25 NOTE — Patient Instructions (Signed)
 Medication Instructions:  Refills sent in today  START Nicotine  Lozenges 2 mg   *If you need a refill on your cardiac medications before your next appointment, please call your pharmacy*  Lab Work IN 1 WEEK: BMP PROBNP LIPID PANEL  If you have labs (blood work) drawn today and your tests are completely normal, you will receive your results only by: MyChart Message (if you have MyChart) OR A paper copy in the mail If you have any lab test that is abnormal or we need to change your treatment, we will call you to review the results.  Testing/Procedures: ECHOCARDIOGRAM  Your physician has requested that you have an echocardiogram. Echocardiography is a painless test that uses sound waves to create images of your heart. It provides your doctor with information about the size and shape of your heart and how well your heart's chambers and valves are working. This procedure takes approximately one hour. There are no restrictions for this procedure. Please do NOT wear cologne, perfume, aftershave, or lotions (deodorant is allowed). Please arrive 15 minutes prior to your appointment time.  Please note: We ask at that you not bring children with you during ultrasound (echo/ vascular) testing. Due to room size and safety concerns, children are not allowed in the ultrasound rooms during exams. Our front office staff cannot provide observation of children in our lobby area while testing is being conducted. An adult accompanying a patient to their appointment will only be allowed in the ultrasound room at the discretion of the ultrasound technician under special circumstances. We apologize for any inconvenience.   Follow-Up: At [facility], you and your health needs are our priority.  As part of our continuing mission to provide you with exceptional heart care, our providers are all part of one team.  This team includes your primary Cardiologist (physician) and Advanced Practice Providers or  APPs (Physician Assistants and Nurse Practitioners) who all work together to provide you with the care you need, when you need it.  Your next appointment:   3 month(s)  Provider:   One of our Advanced Practice Providers (APPs): Morse Clause, PA-C  Lamarr Satterfield, NP Miriam Shams, NP  Olivia Pavy, PA-C Josefa Beauvais, NP  Leontine Salen, PA-C Orren Fabry, PA-C  [place], PA-C Ernest Dick, NP  Damien Braver, NP Jon Hails, PA-C  Waddell Donath, PA-C    Dayna Dunn, PA-C  Scott Weaver, PA-C Lum Louis, NP Katlyn West, NP Callie Goodrich, PA-C  Xika Zhao, NP Sheng Haley, PA-C    Kathleen Johnson, PA-C   Then, Dr. Elmira  will plan to see you again in 1 year(s).

## 2024-02-26 DIAGNOSIS — R4189 Other symptoms and signs involving cognitive functions and awareness: Secondary | ICD-10-CM | POA: Diagnosis not present

## 2024-02-27 DIAGNOSIS — R4189 Other symptoms and signs involving cognitive functions and awareness: Secondary | ICD-10-CM | POA: Diagnosis not present

## 2024-02-28 DIAGNOSIS — R4189 Other symptoms and signs involving cognitive functions and awareness: Secondary | ICD-10-CM | POA: Diagnosis not present

## 2024-03-02 DIAGNOSIS — R4189 Other symptoms and signs involving cognitive functions and awareness: Secondary | ICD-10-CM | POA: Diagnosis not present

## 2024-03-03 DIAGNOSIS — R4189 Other symptoms and signs involving cognitive functions and awareness: Secondary | ICD-10-CM | POA: Diagnosis not present

## 2024-03-04 DIAGNOSIS — R4189 Other symptoms and signs involving cognitive functions and awareness: Secondary | ICD-10-CM | POA: Diagnosis not present

## 2024-03-05 DIAGNOSIS — R4189 Other symptoms and signs involving cognitive functions and awareness: Secondary | ICD-10-CM | POA: Diagnosis not present

## 2024-03-06 DIAGNOSIS — R4189 Other symptoms and signs involving cognitive functions and awareness: Secondary | ICD-10-CM | POA: Diagnosis not present

## 2024-03-09 DIAGNOSIS — R4189 Other symptoms and signs involving cognitive functions and awareness: Secondary | ICD-10-CM | POA: Diagnosis not present

## 2024-03-10 DIAGNOSIS — R4189 Other symptoms and signs involving cognitive functions and awareness: Secondary | ICD-10-CM | POA: Diagnosis not present

## 2024-03-11 DIAGNOSIS — R4189 Other symptoms and signs involving cognitive functions and awareness: Secondary | ICD-10-CM | POA: Diagnosis not present

## 2024-03-12 DIAGNOSIS — R4189 Other symptoms and signs involving cognitive functions and awareness: Secondary | ICD-10-CM | POA: Diagnosis not present

## 2024-03-13 DIAGNOSIS — R4189 Other symptoms and signs involving cognitive functions and awareness: Secondary | ICD-10-CM | POA: Diagnosis not present

## 2024-03-16 DIAGNOSIS — R4189 Other symptoms and signs involving cognitive functions and awareness: Secondary | ICD-10-CM | POA: Diagnosis not present

## 2024-03-17 DIAGNOSIS — R4189 Other symptoms and signs involving cognitive functions and awareness: Secondary | ICD-10-CM | POA: Diagnosis not present

## 2024-03-18 DIAGNOSIS — R4189 Other symptoms and signs involving cognitive functions and awareness: Secondary | ICD-10-CM | POA: Diagnosis not present

## 2024-03-19 DIAGNOSIS — R4189 Other symptoms and signs involving cognitive functions and awareness: Secondary | ICD-10-CM | POA: Diagnosis not present

## 2024-03-20 DIAGNOSIS — R4189 Other symptoms and signs involving cognitive functions and awareness: Secondary | ICD-10-CM | POA: Diagnosis not present

## 2024-03-23 DIAGNOSIS — R4189 Other symptoms and signs involving cognitive functions and awareness: Secondary | ICD-10-CM | POA: Diagnosis not present

## 2024-03-24 DIAGNOSIS — R4189 Other symptoms and signs involving cognitive functions and awareness: Secondary | ICD-10-CM | POA: Diagnosis not present

## 2024-03-25 DIAGNOSIS — R4189 Other symptoms and signs involving cognitive functions and awareness: Secondary | ICD-10-CM | POA: Diagnosis not present

## 2024-03-26 DIAGNOSIS — R4189 Other symptoms and signs involving cognitive functions and awareness: Secondary | ICD-10-CM | POA: Diagnosis not present

## 2024-03-27 DIAGNOSIS — R4189 Other symptoms and signs involving cognitive functions and awareness: Secondary | ICD-10-CM | POA: Diagnosis not present

## 2024-03-30 DIAGNOSIS — R4189 Other symptoms and signs involving cognitive functions and awareness: Secondary | ICD-10-CM | POA: Diagnosis not present

## 2024-03-31 DIAGNOSIS — R4189 Other symptoms and signs involving cognitive functions and awareness: Secondary | ICD-10-CM | POA: Diagnosis not present

## 2024-04-01 DIAGNOSIS — R4189 Other symptoms and signs involving cognitive functions and awareness: Secondary | ICD-10-CM | POA: Diagnosis not present

## 2024-04-02 DIAGNOSIS — R4189 Other symptoms and signs involving cognitive functions and awareness: Secondary | ICD-10-CM | POA: Diagnosis not present

## 2024-04-03 DIAGNOSIS — R4189 Other symptoms and signs involving cognitive functions and awareness: Secondary | ICD-10-CM | POA: Diagnosis not present

## 2024-04-06 ENCOUNTER — Encounter (HOSPITAL_COMMUNITY): Payer: Self-pay | Admitting: Cardiology

## 2024-04-06 ENCOUNTER — Ambulatory Visit (HOSPITAL_COMMUNITY): Attending: Cardiovascular Disease

## 2024-04-07 DIAGNOSIS — R4189 Other symptoms and signs involving cognitive functions and awareness: Secondary | ICD-10-CM | POA: Diagnosis not present

## 2024-04-08 DIAGNOSIS — R4189 Other symptoms and signs involving cognitive functions and awareness: Secondary | ICD-10-CM | POA: Diagnosis not present

## 2024-04-09 DIAGNOSIS — R4189 Other symptoms and signs involving cognitive functions and awareness: Secondary | ICD-10-CM | POA: Diagnosis not present

## 2024-04-10 DIAGNOSIS — R4189 Other symptoms and signs involving cognitive functions and awareness: Secondary | ICD-10-CM | POA: Diagnosis not present

## 2024-04-13 DIAGNOSIS — R4189 Other symptoms and signs involving cognitive functions and awareness: Secondary | ICD-10-CM | POA: Diagnosis not present

## 2024-04-14 DIAGNOSIS — R4189 Other symptoms and signs involving cognitive functions and awareness: Secondary | ICD-10-CM | POA: Diagnosis not present

## 2024-04-15 DIAGNOSIS — R4189 Other symptoms and signs involving cognitive functions and awareness: Secondary | ICD-10-CM | POA: Diagnosis not present

## 2024-04-17 DIAGNOSIS — R4189 Other symptoms and signs involving cognitive functions and awareness: Secondary | ICD-10-CM | POA: Diagnosis not present

## 2024-04-20 DIAGNOSIS — R4189 Other symptoms and signs involving cognitive functions and awareness: Secondary | ICD-10-CM | POA: Diagnosis not present

## 2024-04-22 ENCOUNTER — Other Ambulatory Visit: Payer: Self-pay | Admitting: Student

## 2024-04-22 DIAGNOSIS — E1129 Type 2 diabetes mellitus with other diabetic kidney complication: Secondary | ICD-10-CM

## 2024-04-22 DIAGNOSIS — I10 Essential (primary) hypertension: Secondary | ICD-10-CM

## 2024-04-22 DIAGNOSIS — R4189 Other symptoms and signs involving cognitive functions and awareness: Secondary | ICD-10-CM | POA: Diagnosis not present

## 2024-04-22 NOTE — Telephone Encounter (Signed)
 Copied from CRM [id number]. Topic: Clinical - Medication Refill >> Apr 22, 2024  9:26 AM Alfonso ORN wrote: Medication: empagliflozin  (JARDIANCE ) 10 MG TABS tablet,atorvastatin  (LIPITOR ) 40 MG tablet,metoprolol  succinate (TOPROL -XL) 50 MG 24 hr tablet,sacubitril -valsartan  (ENTRESTO ) 49-51 MG,aspirin  EC 81 MG tablet  Has the patient contacted their pharmacy? Yes (Agent: If no, request that the patient contact the pharmacy for the refill. If patient does not wish to contact the pharmacy document the reason why and proceed with request.) (Agent: If yes, when and what did the pharmacy advise?)  This is the patient's preferred pharmacy:  [facility] - [place], [place] - [address] [place] [address] Phone: [phone] Fax: [phone]  Is this the correct pharmacy for this prescription? Yes If no, delete pharmacy and type the correct one.   Has the prescription been filled recently? No  Is the patient out of the medication? Yes , he will be out of medication as of today   Has the patient been seen for an appointment in the last year OR does the patient have an upcoming appointment? Yes  Can we respond through MyChart? Yes  Agent: Please be advised that Rx refills may take up to 3 business days. We ask that you follow-up with your pharmacy.   ----------------------------------------------------------------------- From previous Reason for Contact - Medication Question: Reason for CRM: Patient

## 2024-04-25 MED ORDER — ATORVASTATIN CALCIUM 40 MG PO TABS: 40.0000 mg | ORAL_TABLET | Freq: Every day | ORAL | 3 refills | Status: AC

## 2024-04-25 MED ORDER — METOPROLOL SUCCINATE ER 50 MG PO TB24: 50.0000 mg | ORAL_TABLET | Freq: Every day | ORAL | 3 refills | Status: AC

## 2024-04-25 MED ORDER — EMPAGLIFLOZIN 10 MG PO TABS: 10.0000 mg | ORAL_TABLET | Freq: Every day | ORAL | 3 refills | Status: AC

## 2024-04-25 MED ORDER — SACUBITRIL-VALSARTAN 49-51 MG PO TABS: 1.0000 | ORAL_TABLET | Freq: Two times a day (BID) | ORAL | 3 refills | Status: AC

## 2024-04-27 ENCOUNTER — Other Ambulatory Visit (HOSPITAL_COMMUNITY): Payer: Self-pay

## 2024-04-27 DIAGNOSIS — R4189 Other symptoms and signs involving cognitive functions and awareness: Secondary | ICD-10-CM | POA: Diagnosis not present

## 2024-04-28 DIAGNOSIS — R4189 Other symptoms and signs involving cognitive functions and awareness: Secondary | ICD-10-CM | POA: Diagnosis not present

## 2024-04-29 DIAGNOSIS — R4189 Other symptoms and signs involving cognitive functions and awareness: Secondary | ICD-10-CM | POA: Diagnosis not present

## 2024-04-30 ENCOUNTER — Other Ambulatory Visit: Payer: Self-pay

## 2024-04-30 ENCOUNTER — Emergency Department (HOSPITAL_COMMUNITY): Admission: EM | Admit: 2024-04-30 | Discharge: 2024-04-30 | Disposition: A | Discharge status: Home / Self Care

## 2024-04-30 ENCOUNTER — Emergency Department (HOSPITAL_COMMUNITY)

## 2024-04-30 DIAGNOSIS — I6523 Occlusion and stenosis of bilateral carotid arteries: Secondary | ICD-10-CM | POA: Diagnosis not present

## 2024-04-30 DIAGNOSIS — R569 Unspecified convulsions: Secondary | ICD-10-CM

## 2024-04-30 DIAGNOSIS — G9389 Other specified disorders of brain: Secondary | ICD-10-CM | POA: Diagnosis not present

## 2024-04-30 DIAGNOSIS — G40909 Epilepsy, unspecified, not intractable, without status epilepticus: Secondary | ICD-10-CM

## 2024-04-30 DIAGNOSIS — R4189 Other symptoms and signs involving cognitive functions and awareness: Secondary | ICD-10-CM | POA: Diagnosis not present

## 2024-04-30 LAB — CBC WITH DIFFERENTIAL/PLATELET
Abs Immature Granulocytes: 0.01 K/uL (ref 0.00–0.07)
Basophils Absolute: 0.1 K/uL (ref 0.0–0.1)
Basophils Relative: 1 %
Eosinophils Absolute: 0.4 K/uL (ref 0.0–0.5)
Eosinophils Relative: 6 %
HCT: 46.3 % (ref 39.0–52.0)
Hemoglobin: 14.8 g/dL (ref 13.0–17.0)
Immature Granulocytes: 0 %
Lymphocytes Relative: 33 %
Lymphs Abs: 2 K/uL (ref 0.7–4.0)
MCH: 25.8 pg — ABNORMAL LOW (ref 26.0–34.0)
MCHC: 32 g/dL (ref 30.0–36.0)
MCV: 80.8 fL (ref 80.0–100.0)
Monocytes Absolute: 0.6 K/uL (ref 0.1–1.0)
Monocytes Relative: 10 %
Neutro Abs: 3.1 K/uL (ref 1.7–7.7)
Neutrophils Relative %: 50 %
Platelets: 213 K/uL (ref 150–400)
RBC: 5.73 MIL/uL (ref 4.22–5.81)
RDW: 15.6 % — ABNORMAL HIGH (ref 11.5–15.5)
WBC: 6.1 K/uL (ref 4.0–10.5)
nRBC: 0 % (ref 0.0–0.2)

## 2024-04-30 LAB — URINALYSIS, ROUTINE W REFLEX MICROSCOPIC
Bacteria, UA: NONE SEEN
Bilirubin Urine: NEGATIVE
Glucose, UA: NEGATIVE mg/dL
Hgb urine dipstick: NEGATIVE
Ketones, ur: NEGATIVE mg/dL
Leukocytes,Ua: NEGATIVE
Nitrite: NEGATIVE
Protein, ur: 100 mg/dL — AB
Specific Gravity, Urine: 1.014 (ref 1.005–1.030)
pH: 5 (ref 5.0–8.0)

## 2024-04-30 LAB — COMPREHENSIVE METABOLIC PANEL WITH GFR
ALT: 19 U/L (ref 0–44)
AST: 34 U/L (ref 15–41)
Albumin: 3.7 g/dL (ref 3.5–5.0)
Alkaline Phosphatase: 70 U/L (ref 38–126)
Anion gap: 22 — ABNORMAL HIGH (ref 5–15)
BUN: 10 mg/dL (ref 8–23)
CO2: 14 mmol/L — ABNORMAL LOW (ref 22–32)
Calcium: 9.1 mg/dL (ref 8.9–10.3)
Chloride: 101 mmol/L (ref 98–111)
Creatinine, Ser: 1.36 mg/dL — ABNORMAL HIGH (ref 0.61–1.24)
GFR, Estimated: 59 mL/min — ABNORMAL LOW (ref >60)
Glucose, Bld: 91 mg/dL (ref 70–99)
Potassium: 3.9 mmol/L (ref 3.5–5.1)
Sodium: 137 mmol/L (ref 135–145)
Total Bilirubin: 0.6 mg/dL (ref 0.0–1.2)
Total Protein: 7 g/dL (ref 6.5–8.1)

## 2024-04-30 LAB — ACETAMINOPHEN LEVEL: Acetaminophen (Tylenol), Serum: <10 ug/mL — ABNORMAL LOW (ref 10–30)

## 2024-04-30 LAB — CBG MONITORING, ED: Glucose-Capillary: 92 mg/dL (ref 70–99)

## 2024-04-30 LAB — RAPID URINE DRUG SCREEN, HOSP PERFORMED
Amphetamines: NOT DETECTED
Barbiturates: NOT DETECTED
Benzodiazepines: NOT DETECTED
Cocaine: POSITIVE — AB
Opiates: NOT DETECTED
Tetrahydrocannabinol: NOT DETECTED

## 2024-04-30 LAB — ETHANOL: Alcohol, Ethyl (B): <15 mg/dL (ref <15)

## 2024-04-30 LAB — SALICYLATE LEVEL: Salicylate Lvl: <7 mg/dL — ABNORMAL LOW (ref 7.0–30.0)

## 2024-04-30 LAB — MAGNESIUM: Magnesium: 2.4 mg/dL (ref 1.7–2.4)

## 2024-04-30 MED ORDER — LACOSAMIDE 200 MG PO TABS: 200.0000 mg | ORAL_TABLET | Freq: Two times a day (BID) | ORAL | 2 refills | Status: DC

## 2024-04-30 MED ORDER — LEVETIRACETAM (KEPPRA) 500 MG/5 ML ADULT IV PUSH
1500.0000 mg | Freq: Once | INTRAVENOUS | Status: AC
  Administered 2024-04-30: 1500 mg via INTRAVENOUS
  Filled 2024-04-30: qty 15

## 2024-04-30 MED ORDER — LEVETIRACETAM 750 MG PO TABS: 750.0000 mg | ORAL_TABLET | Freq: Two times a day (BID) | ORAL | 0 refills | Status: DC

## 2024-04-30 MED ORDER — LACTATED RINGERS IV BOLUS
1000.0000 mL | Freq: Once | INTRAVENOUS | Status: AC
  Administered 2024-04-30: 1000 mL via INTRAVENOUS

## 2024-04-30 MED ORDER — SODIUM CHLORIDE 0.9 % IV SOLN
200.0000 mg | Freq: Once | INTRAVENOUS | Status: AC
  Administered 2024-04-30: 200 mg via INTRAVENOUS
  Filled 2024-04-30: qty 20

## 2024-04-30 NOTE — ED Triage Notes (Signed)
 Pt BIB [facility] from home. Pt had seizure 5 min full body. Without meds for three days. Pharmacy couldn't fill as it wasn't eligible for refill. Last seizure in September. Laying when it happened. Post ictal but following commands. 18 Rt FA   CBG 107

## 2024-04-30 NOTE — ED Provider Notes (Signed)
 [place] EMERGENCY DEPARTMENT AT [facility] Provider Note   CSN: [id number] Arrival date & time: 04/30/24  1556     Patient presents with: Seizures   Arthur [name] is a 63 y.o. male. Hx of hypertension, type 2 diabetes mellitus, h/o alcohol abuse, nonobstructive CAD, HFrEF, nonischemic cardiomyopathy, seizure disorder currently on Keppra  750 mg BID and Vimpat  200 mg BID presenting status post seizures today.  History per EMS, history limited from patient secondary to mild confusion.  Per report, patient was at home, has been without his medication for 3 days is unable to pick up from the pharmacy.  Reports that he was lying in bed, was appreciated to have a seizure that lasted approximately 5 minutes.  No medication given to break the seizure.  BG 107.  Lasted reported seizure in September.  Postictal on arrival of EMS, continue remained postictal however improvement on arrival to the ED, GCS of 14.  Able to answer some questions, however confused answering to others.  Keeps eyes open, able to follow commands.  Unsure what medications he supposed be on, however reported to be a blue egg shaped one.  No urinary incontinence or tongue biting reported.    Seizures      Prior to Admission medications   Medication Sig Start Date End Date Taking? Authorizing Provider  lacosamide  (VIMPAT ) 200 MG TABS tablet Take 1 tablet (200 mg total) by mouth 2 (two) times daily. 04/30/24  Yes Arlee Katz, MD  levETIRAcetam  (KEPPRA ) 750 MG tablet Take 1 tablet (750 mg total) by mouth 2 (two) times daily. 04/30/24 05/30/24 Yes Arlee Katz, MD  aspirin  EC 81 MG tablet Take 1 tablet (81 mg total) by mouth daily. Swallow whole. 02/25/24   Patwardhan, Newman PARAS, MD  atorvastatin  (LIPITOR ) 40 MG tablet Take 1 tablet (40 mg total) by mouth daily. 04/25/24   Tobie Gaines, DO  Blood Pressure Monitor KIT Use to check blood pressure dailly. 10/19/21   Tobie Litter, MD  cholecalciferol  (VITAMIN D ) 25 MCG  (1000 UNIT) tablet Take by mouth. 09/07/21     empagliflozin  (JARDIANCE ) 10 MG TABS tablet Take 1 tablet (10 mg total) by mouth daily before breakfast. 04/25/24   Tobie Gaines, DO  lacosamide  (VIMPAT ) 200 MG TABS tablet Take 1 tablet (200 mg total) by mouth 2 (two) times daily. 12/04/23   Lomax, Amy, NP  levETIRAcetam  (KEPPRA ) 750 MG tablet Take 1 tablet (750 mg total) by mouth 2 (two) times daily. 12/04/23   Lomax, Amy, NP  metoprolol  succinate (TOPROL -XL) 50 MG 24 hr tablet Take 1 tablet (50 mg total) by mouth daily with or immediately following a meal (bedtime). 04/25/24   Tobie Gaines, DO  nicotine  polacrilex (COMMIT) 2 MG lozenge Take 1 lozenge (2 mg total) by mouth as needed for smoking cessation. 02/25/24   Patwardhan, Newman PARAS, MD  sacubitril -valsartan  (ENTRESTO ) 49-51 MG Take 1 tablet by mouth 2 (two) times daily. 04/25/24   Tobie Gaines, DO  sertraline  (ZOLOFT ) 50 MG tablet Take 1 tablet (50 mg total) by mouth daily. 08/28/23 08/27/24  Harrie Bruckner, DO    Allergies: Penicillins    Review of Systems  Neurological:  Positive for seizures.    Updated Vital Signs BP 139/77   Pulse 69   Temp 97.8 F (36.6 C) (Oral)   Resp 19   Ht 5' 3 (1.6 m)   Wt 60 kg   SpO2 100%   BMI 23.43 kg/m   Physical Exam Vitals and nursing note  reviewed.  Constitutional:      General: He is not in acute distress.    Appearance: He is well-developed. He is not ill-appearing.     Comments: GCS 14, mildly confused answering, able to answer some questions.  Moving all extremities, able to follow commands.  HENT:     Head: Normocephalic and atraumatic.     Mouth/Throat:     Mouth: Mucous membranes are moist.     Pharynx: Oropharynx is clear. No oropharyngeal exudate or posterior oropharyngeal erythema.     Comments: No appreciable tongue biting. Eyes:     Conjunctiva/sclera: Conjunctivae normal.  Cardiovascular:     Rate and Rhythm: Normal rate and regular rhythm.     Pulses: Normal pulses.     Heart  sounds: Normal heart sounds. No murmur heard. Pulmonary:     Effort: Pulmonary effort is normal. No respiratory distress.     Breath sounds: Normal breath sounds.  Abdominal:     General: Abdomen is flat.     Palpations: Abdomen is soft.     Tenderness: There is no abdominal tenderness. There is no right CVA tenderness, left CVA tenderness or guarding.  Musculoskeletal:        General: No swelling.     Cervical back: Normal range of motion and neck supple. No rigidity.     Comments: No obvious external traumatic injuries appreciated to extremities.  No lacerations or abrasions appreciated.  Skin:    General: Skin is warm and dry.     Capillary Refill: Capillary refill takes less than 2 seconds.  Neurological:     Mental Status: He is alert.     Cranial Nerves: No cranial nerve deficit.     Sensory: No sensory deficit.     Motor: No weakness.     Comments: Oriented to self, not oriented to place or time.  Psychiatric:        Mood and Affect: Mood normal.     (all labs ordered are listed, but only abnormal results are displayed) Labs Reviewed  CBC WITH DIFFERENTIAL/PLATELET - Abnormal; Notable for the following components:      Result Value   MCH 25.8 (*)    RDW 15.6 (*)    All other components within normal limits  COMPREHENSIVE METABOLIC PANEL WITH GFR - Abnormal; Notable for the following components:   CO2 14 (*)    Creatinine, Ser 1.36 (*)    GFR, Estimated 59 (*)    Anion gap 22 (*)    All other components within normal limits  URINALYSIS, ROUTINE W REFLEX MICROSCOPIC - Abnormal; Notable for the following components:   APPearance HAZY (*)    Protein, ur 100 (*)    All other components within normal limits  RAPID URINE DRUG SCREEN, HOSP PERFORMED - Abnormal; Notable for the following components:   Cocaine POSITIVE (*)    All other components within normal limits  ACETAMINOPHEN  LEVEL - Abnormal; Notable for the following components:   Acetaminophen  (Tylenol ), Serum <10  (*)    All other components within normal limits  SALICYLATE LEVEL - Abnormal; Notable for the following components:   Salicylate Lvl <7.0 (*)    All other components within normal limits  MAGNESIUM   ETHANOL  CBG MONITORING, ED    EKG: None  Radiology: CT Head Wo Contrast Result Date: 04/30/2024 CLINICAL DATA:  Seizures. EXAM: CT HEAD WITHOUT CONTRAST TECHNIQUE: Contiguous axial images were obtained from the base of the skull through the vertex without intravenous contrast.  RADIATION DOSE REDUCTION: This exam was performed according to the departmental dose-optimization program which includes automated exposure control, adjustment of the mA and/or kV according to patient size and/or use of iterative reconstruction technique. COMPARISON:  November 27, 2023 FINDINGS: Brain: There is generalized cerebral atrophy with widening of the extra-axial spaces and ventricular dilatation. There are areas of decreased attenuation within the white matter tracts of the supratentorial brain, consistent with microvascular disease changes. Chronic left occipital lobe, left temporoparietal infarcts are seen. 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 Vascular: There is mild to moderate severity bilateral cavernous carotid artery calcification. Skull: Normal. Negative for fracture or focal lesion. Sinuses/Orbits: Mild right maxillary sinus and marked severity left maxillary sinus mucosal thickening is seen with multiple polyps versus mucous retention cyst. Other: None. IMPRESSION: 1. Generalized cerebral atrophy with chronic left occipital lobe, left temporoparietal infarcts. 2. 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 3. No acute intracranial abnormality. 4. Bilateral maxillary sinus disease. Electronically Signed   By: Suzen Dials M.D.   On: 04/30/2024 18:40     Procedures   Medications Ordered in the ED  levETIRAcetam  (KEPPRA ) undiluted injection 1,500  mg (1,500 mg Intravenous Given 04/30/24 1636)  lacosamide  (VIMPAT ) 200 mg in sodium chloride  0.9 % 25 mL IVPB (0 mg Intravenous Stopped 04/30/24 1819)  lactated ringers  bolus 1,000 mL (0 mLs Intravenous Stopped 04/30/24 1957)    Clinical Course as of 05/01/24 0003  Thu Apr 30, 2024  1957 Patient with significant improvement in mental status, we will attempt to walk the patient at this time.  [BS]  2101 Patient was able to walk, tested positive for cocaine.  Patient feels significantly improved at this time, patient overall stable for discharge. [BS]    Clinical Course User Index [BS] Arlee Katz, MD                                 Medical Decision Making Amount and/or Complexity of Data Reviewed Labs: ordered. Radiology: ordered.  Risk Prescription drug management.   Based on patient presentation, history, evaluation, high suspicion for seizure activity secondary to medication noncompliance, as well as cocaine use today.  Workup overall reassuring against intracranial bleed versus alcohol intoxication versus Tylenol  overdose versus salicylate overdose versus UTI versus electrolyte derangement versus hypoglycemia versus hyponatremia versus anemia.  Elevated anion gap in the setting of seizure, improvement in mental status throughout time in the ED.  Provided fluid resuscitation.  Also provided patient home dosage of medications including Keppra  and Vimpat .  Provided a prescription to patient desired pharmacy, and agreeable to pick up first thing tomorrow morning.  Patient will overall be covered for the rest of the night.  No further episodes of seizure-like activity while in the ED.  Patient able to walk around the ED without difficulty, also has a safe ride home with family member.  Discussed recommendation to not use cocaine again in the future, as this can exacerbate pain caused seizures particularly if patient is noncompliant with medication.  Overall, with prescription sent to  pharmacy, follow-up with PCP in 3 to 4 days, patient was overall stable for discharge.     Final diagnoses:  Seizure (HCC)  Seizure disorder [facility])    ED Discharge Orders          Ordered    lacosamide  (VIMPAT ) 200 MG TABS tablet  2 times daily  04/30/24 2132    levETIRAcetam  (KEPPRA ) 750 MG tablet  2 times daily        04/30/24 2132               Arlee Katz, MD 05/01/24 0003

## 2024-04-30 NOTE — ED Notes (Signed)
 Patient abel to stand up and ambulate with steady gait.

## 2024-05-01 DIAGNOSIS — R4189 Other symptoms and signs involving cognitive functions and awareness: Secondary | ICD-10-CM | POA: Diagnosis not present

## 2024-05-04 ENCOUNTER — Ambulatory Visit
Admit: 2024-05-04 | Discharge: 2024-05-04 | Payer: BLUE CROSS/BLUE SHIELD | Attending: Family Medicine | Primary: Family Medicine

## 2024-05-04 DIAGNOSIS — R4189 Other symptoms and signs involving cognitive functions and awareness: Secondary | ICD-10-CM | POA: Diagnosis not present

## 2024-05-04 DIAGNOSIS — Z0000 Encounter for general adult medical examination without abnormal findings: Principal | ICD-10-CM

## 2024-05-04 NOTE — Assessment & Plan Note (Signed)
 Discussed safety, healthy diet and exercise.

## 2024-05-04 NOTE — Progress Notes (Signed)
 [facility]  [address]  [address]  [place] [address]  Dept: [phone]  Dept Fax: [phone]     Patient ID: Bobby Beltran is a 63 y.o. male who presents for Annual Exam.    Subjective   History of Present Illness  The patient presents for his annual checkup.    He reports no significant health concerns at present. Statin therapy was initiated last year, specifically rosuvastatin  5 mg, following an episode of atypical chest pain. No adverse effects from the medication have been experienced. A regular exercise regimen is maintained, including light weightlifting every other day and walking. Physical activity decreases during the winter months, but treadmill use occurs 3 to 4 times per week. Daily walks with his [place] around [place] are common during the summer. No further episodes of chest pain have occurred since the initial admission, which may have been stress-related.    Mild snoring has been reported by his wife, but overall sleep quality is good despite occasional awakenings at night. Maintaining a consistent wake-up time of 5:00 AM throughout the week, including weekends, contributes to his overall well-being.    He has not received the influenza vaccine and expresses reluctance towards it. The shingles vaccine is being considered, and he has not previously received the pneumonia vaccine. No gastrointestinal symptoms such as acid indigestion, diarrhea, or constipation are reported, and there are no dietary restrictions. Discomfort occasionally occurs after consuming spicy foods or garlic. No urinary issues are reported. 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 Hernia repair with mesh placement was performed on the same day as the orchiectomy.    Social History:  Marital Status: Married  Occupation: Photographer, mortgages  Hobbies: Careers information officer, walking  Sleep: Generally good sleep quality, wakes up at 5:00 AM daily    PAST SURGICAL  HISTORY:  Orchiectomy for testicular cancer in 2001  Hernia repair with mesh placement in 2001    FAMILY HISTORY  - Father had a triple bypass surgery in his late 12s  - Father had a stroke in his 49s  - Mother had pneumonia  - Mother broke her shoulder from a fall  Problem List[1]  Current Outpatient Medications   Medication Instructions    multivitamin capsule one by mouth daily for health    rosuvastatin  (CRESTOR ) 5 mg, oral, Daily     Allergies[2]    Objective   Visit Vitals  BP 120/78   Pulse 78   Ht 1.753 m   Wt 77.1 kg   SpO2 98%   BMI 25.10 kg/m   BSA 1.94 m       Physical Exam  Constitutional:       Appearance: He is normal weight.   HENT:      Head: Normocephalic.      Right Ear: Tympanic membrane normal.      Left Ear: Tympanic membrane normal.      Nose: Nose normal.   Eyes:      Pupils: Pupils are equal, round, and reactive to light.   Cardiovascular:      Rate and Rhythm: Normal rate and regular rhythm.      Pulses: Normal pulses.      Heart sounds: Normal heart sounds.   Pulmonary:      Breath sounds: Normal breath sounds.   Abdominal:      Palpations: Abdomen is soft.   Genitourinary:     Penis: Normal.  Testes: Normal.   Skin:     General: Skin is warm.   Neurological:      Mental Status: He is alert.         Results      Assessment/Plan   Assessment & Plan  1. Health maintenance:   - Consider receiving the Shingrix vaccine for shingles.  - Consider receiving the Prevnar 20 vaccine for pneumonia.  - Comprehensive blood panel will be ordered to monitor overall health status.  - Importance of avoiding sunburn and using sunscreen regularly.    2. Hyperlipidemia: Stable.  - Currently on rosuvastatin  5 mg without side effects.  - Given 12% risk for a heart event and family history of cardiovascular issues, continuation of rosuvastatin  is recommended.  - Blood tests will be conducted to assess the effectiveness of the current statin therapy.    3. History of testicular cancer:   - Testicular cancer  treated with surgery in 2001.  - No current issues reported.  - PSA levels will be checked as part of routine blood work to monitor for any changes.    Follow-up  - Blood tests to assess statin therapy effectiveness.    Bobby Beltran was seen today for annual exam.  Preventative health care  Assessment & Plan:  Discussed safety, healthy diet and exercise.      Orders:  -     Basic metabolic panel; Future  Atypical chest pain  Assessment & Plan:  Seen by cardiology, but no further symptoms.      Prostate cancer screening  -     PSA Total, Screen; Future  Mixed hyperlipidemia  -     Lipid panel; Future      Patient Health Questionnaire-2 Score: 0   Interpretation: Negative screening.     Follow-up & Interventions:   - Maintain annual screening - No additional Follow-up required.        This visit note was drafted with the help of artificial intelligence. I obtained consent to record the visit from all participants for this purpose.    Debby Seabrook, MD   Generalized Anxiety Disorder Screening - GAD-7 Score: 0   Interpretation: Negative screening.  Follow up & Intervention: Maintain annual screening - No additional Follow-up required      Patient Health Questionnaire-2 Score: 0   Interpretation: Negative screening.     Follow-up & Interventions:   - Maintain annual screening - No additional Follow-up required.         [1]   Patient Active Problem List  Diagnosis    Diverticulosis    Hx of testicular cancer    Seasonal allergies    Basal cell carcinoma (BCC) of overlapping sites of skin    Atypical chest pain    Preventative health care   [2] No Known Allergies

## 2024-05-04 NOTE — Assessment & Plan Note (Signed)
 Seen by cardiology, but no further symptoms.

## 2024-05-04 NOTE — Patient Instructions (Addendum)
 Get shingrix vaccine at the drugstore (2 doses)       Think about getting Prevnar 20 pneumonia vaccine at drugstore.

## 2024-05-05 DIAGNOSIS — R4189 Other symptoms and signs involving cognitive functions and awareness: Secondary | ICD-10-CM | POA: Diagnosis not present

## 2024-05-05 LAB — BASIC METABOLIC PANEL
Anion Gap: 10 mmol/L (ref 10.0–18.0)
BUN/Creat Ratio: 13 (ref 10–24)
BUN: 11 mg/dL (ref 8–27)
Calcium: 9.9 mg/dL (ref 8.6–10.2)
Carbon Dioxide: 27 mmol/L (ref 20–29)
Chloride: 106 mmol/L (ref 96–106)
Creat: 0.84 mg/dL (ref 0.76–1.27)
Glucose: 92 mg/dL (ref 70–99)
Potassium: 4.3 mmol/L (ref 3.5–5.2)
Sodium: 143 mmol/L (ref 134–144)
eGFR: 99 mL/min/1.73 (ref >59)

## 2024-05-05 LAB — LIPID PANEL
Cholesterol: 154 mg/dL (ref 100–199)
HDL Cholesterol: 51 mg/dL (ref >39)
LDLc Calc (NIH): 84 mg/dL (ref 0–99)
Non-HDL Chol: 103 mg/dL (ref 0–129)
Triglycerides: 106 mg/dL (ref 0–149)
VLDLc Calc: 19 mg/dL (ref 5–40)

## 2024-05-05 LAB — PSA TOTAL, SCREEN: PSA TOTAL: 1.4 ng/mL (ref 0.0–4.0)

## 2024-05-06 DIAGNOSIS — R4189 Other symptoms and signs involving cognitive functions and awareness: Secondary | ICD-10-CM | POA: Diagnosis not present

## 2024-05-07 DIAGNOSIS — R4189 Other symptoms and signs involving cognitive functions and awareness: Secondary | ICD-10-CM | POA: Diagnosis not present

## 2024-05-08 DIAGNOSIS — R4189 Other symptoms and signs involving cognitive functions and awareness: Secondary | ICD-10-CM | POA: Diagnosis not present

## 2024-05-12 ENCOUNTER — Ambulatory Visit (HOSPITAL_COMMUNITY)
Admission: RE | Admit: 2024-05-12 | Discharge: 2024-05-12 | Disposition: A | Source: Ambulatory Visit | Attending: Cardiology

## 2024-05-12 DIAGNOSIS — I10 Essential (primary) hypertension: Secondary | ICD-10-CM | POA: Diagnosis not present

## 2024-05-12 DIAGNOSIS — I517 Cardiomegaly: Secondary | ICD-10-CM

## 2024-05-12 LAB — ECHOCARDIOGRAM COMPLETE
AR max vel: 1.97 cm2
AV Area VTI: 1.97 cm2
AV Area mean vel: 1.81 cm2
AV Mean grad: 6 mmHg
AV Peak grad: 11.2 mmHg
Ao pk vel: 1.68 m/s
Area-P 1/2: 3.03 cm2
S' Lateral: 3.15 cm

## 2024-05-13 ENCOUNTER — Ambulatory Visit: Payer: Self-pay | Admitting: Cardiology

## 2024-05-13 NOTE — Progress Notes (Signed)
 Normal EF. Seeing you soon.  Thanks MJP

## 2024-05-17 NOTE — Progress Notes (Deleted)
 "  Cardiology Office Note    Date:  05/17/2024  ID:  Arthur [name], DOB November 04, 1960, MRN [id number] PCP:  Arthur Gaines, DO  Cardiologist:  None  Electrophysiologist:  None   Chief Complaint: ***  History of Present Illness: Arthur    BIRT [name] is a 63 y.o. male with visit-pertinent history of hypertension, type 2 diabetes mellitus, history of alcohol abuse, nonobstructive CAD, recovered HFrEF, nonischemic cardiomyopathy and seizure disorder.  Coronary angiogram in 05/2021 indicated nonobstructive CAD in LAD and RCA, unable to perform OM RFR evaluation due to severe angulated LCx takeoff, patient noted have severe disease and medium size diagonal 2 however did not feel that this explains his cardiomyopathy.  Patient was last seen in clinic by Dr. Elmira on 02/25/2024 for follow-up.  He denied any recent chest pain or shortness of breath.  It is noted that he been out of his hypertension medications for the prior few days.  Patient continue to smoke 2 Starkes day and drink 4 beers.  Echocardiogram on 05/12/2024 indicated LVEF 65 to 70%, no RWMA, moderate LVH, G1 DD, RV systolic function and size is normal, normal PASP, aortic valve sclerosis present without evidence of stenosis.  Today he presents for follow-up.  He reports that he   HFrEF:  Prior echo in 2022 indicated LVEF 40 to 45%, global hypokinesis.  LHC in 2023 with nonobstructive CAD.  Patient's LVEF recovered in 2023 on GDMT. Last echo on 05/12/2024 indicated LVEF 65 to 70%, no RWMA, moderate LVH, G1 DD, RV systolic function and size is normal, normal PASP, aortic valve sclerosis present without evidence of stenosis.  CAD: Patient with history of nonobstructive CAD on cardiac catheterization in 2023. Stable with no anginal symptoms. No indication for ischemic evaluation.   Heart healthy diet and regular cardiovascular exercise encouraged.   Continue   Hypertension: Blood pressure today   Hyperlipidemia: Last lipid profile  on  Nicotine  dependence:  Labwork independently reviewed:   ROS: .   *** denies chest pain, shortness of breath, lower extremity edema, fatigue, palpitations, melena, hematuria, hemoptysis, diaphoresis, weakness, presyncope, syncope, orthopnea, and PND.  All other systems are reviewed and otherwise negative.  Studies Reviewed: Arthur    EKG:  EKG is ordered today, personally reviewed, demonstrating ***     CV Studies: Cardiac studies reviewed are outlined and summarized above. Otherwise please see EMR for full report. Cardiac Studies & Procedures   ______________________________________________________________________________________________ CARDIAC CATHETERIZATION  CARDIAC CATHETERIZATION 06/27/2021  Conclusion Images from the original result were not included. LM: Normal LAD: Large vessel Mid LAD focal 40% stenosis (RFR 0.94) Diag 1 ostial 70% stenosis Diag 2 ostial-mid diffuse 90% stenosis Lcx: Small OM1 vessel, focal 90% stenosis (RFR 0.96) Mid Lcx focal 50% stenoses RCA: Focal mid 50% stenosis  LVEDP normal  Nonobstructive CAD in LAD and RCA Unable to perform OM RFR evaluation due to severe angulated Lcx takeoff Severe disease in medium sized diag 2. This alone does not explain his cardiomyopathy. There is no obstructive disease in basal inferior wall, which is usually supplied by RCA.  He doe snot have any angina symptoms at this time. Dyspnea is mild. 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 I do not think this will change with diag revascularization at this time.  Recommend continued management of HFrEF. Change lisinopril  20 mg to Entresto  49-51 mg bid. If EF remains low in spite of optimal GDMT for heart failure, and if dyspnea persists, could then consider diag revascularization  for possible angina equivalent dyspnea.   Arthur Arthur Lawrence, MD Pager: [phone] Office: [phone]  Findings Coronary Findings Diagnostic   Dominance: Right  Left Anterior Descending Mid LAD lesion is 40% stenosed.  First Diagonal Branch 1st Diag lesion is 70% stenosed.  Second Diagonal Branch 2nd Diag lesion is 90% stenosed.  Ramus Intermedius Vessel is moderate in size.  Left Circumflex Mid Cx-1 lesion is 50% stenosed. Mid Cx-2 lesion is 50% stenosed.  First Obtuse Marginal Branch 1st Mrg lesion is 90% stenosed.  Right Coronary Artery Mid RCA lesion is 50% stenosed. Pressure wire/FFR was performed on the lesion. FFR: 0.96.  Intervention  No interventions have been documented.   STRESS TESTS  PCV MYOCARDIAL PERFUSION WITH LEXISCAN  05/17/2021  Interpretation Summary Lexiscan  Tetrofosmin stress test 05/17/2021: 1 Day Rest/Stress Protocol. Stress EKG is non-diagnostic, as this is pharmacological stress test using Lexiscan . Small size, moderate intensity, fixed perfusion defect involving the basal to mid inferior segments suggestive of prior infarct with peri-infarct ischemia cannot be entirely ruled out.  No apparent reversible myocardial ischemia. Hypertensive response at rest. Left ventricular size dilated. Calculated LVEF 32%, visually appears to be mildly reduced with inferior wall hypokinesis. Clinical correlation is strongly encouraged. No prior studies for comparison. Intermediate Risk Study.   ECHOCARDIOGRAM  ECHOCARDIOGRAM COMPLETE 05/12/2024  Narrative ECHOCARDIOGRAM REPORT    Patient Name:   Arthur [name]  Date of Exam: 05/12/2024 Medical Rec #:  [id number]     Height:       63.0 in Accession #:    [id number]    Weight:       132.3 lb Date of Birth:  23-Feb-1961      BSA:          1.622 m Patient Age:    63 years      BP:           150/94 mmHg Patient Gender: M             HR:           70 bpm. Exam Location:  [address]  Procedure: 2D Echo, 3D Echo, Cardiac Doppler, Color Doppler and Strain Analysis (Both Spectral and Color Flow Doppler were utilized  during procedure).  Indications:    I51.7 Left Ventricular Hypertrophy  History:        Patient has prior history of Echocardiogram examinations, most recent 01/17/2022. LVH; Risk Factors:Current Smoker, Diabetes, Hypertension and Family History of Coronary Artery Disease. Seizure Disorder, Non-Ischemic Cardiomyopathy.  Sonographer:    Heather Hawks RDCS Referring Phys: Arthur Arthur PATWARDHAN  IMPRESSIONS   1. Left ventricular ejection fraction, by estimation, is 65 to 70%. Left ventricular ejection fraction by 3D volume is 71 %. The left ventricle has normal function. The left ventricle has no regional wall motion abnormalities. There is moderate left ventricular hypertrophy. Left ventricular diastolic parameters are consistent with Grade I diastolic dysfunction (impaired relaxation). The average left ventricular global longitudinal strain is -20.9 %. The global longitudinal strain is normal. 2. Right ventricular systolic function is normal. The right ventricular size is normal. There is normal pulmonary artery systolic pressure. The estimated right ventricular systolic pressure is 29.0 mmHg. 3. The mitral valve is normal in structure. Trivial mitral valve regurgitation. No evidence of mitral stenosis. 4. The aortic valve is tricuspid. Aortic valve regurgitation is not visualized. Aortic valve sclerosis is present, with no evidence of aortic valve stenosis. 5. The inferior vena cava is normal in size with greater than 50% respiratory variability,  suggesting right atrial pressure of 3 mmHg.  FINDINGS Left Ventricle: Left ventricular ejection fraction, by estimation, is 65 to 70%. Left ventricular ejection fraction by 3D volume is 71 %. The left ventricle has normal function. The left ventricle has no regional wall motion abnormalities. The average left ventricular global longitudinal strain is -20.9 %. Strain was performed and the global longitudinal strain is normal. The left ventricular  internal cavity size was normal in size. There is moderate left ventricular hypertrophy. Left ventricular diastolic parameters are consistent with Grade I diastolic dysfunction (impaired relaxation).  Right Ventricle: The right ventricular size is normal. No increase in right ventricular wall thickness. Right ventricular systolic function is normal. There is normal pulmonary artery systolic pressure. The tricuspid regurgitant velocity is 2.55 m/s, and with an assumed right atrial pressure of 3 mmHg, the estimated right ventricular systolic pressure is 29.0 mmHg.  Left Atrium: Left atrial size was normal in size.  Right Atrium: Right atrial size was normal in size.  Pericardium: There is no evidence of pericardial effusion.  Mitral Valve: The mitral valve is normal in structure. Trivial mitral valve regurgitation. No evidence of mitral valve stenosis.  Tricuspid Valve: The tricuspid valve is normal in structure. Tricuspid valve regurgitation is trivial.  Aortic Valve: The aortic valve is tricuspid. Aortic valve regurgitation is not visualized. Aortic valve sclerosis is present, with no evidence of aortic valve stenosis. Aortic valve mean gradient measures 6.0 mmHg. Aortic valve peak gradient measures 11.2 mmHg. Aortic valve area, by VTI measures 1.97 cm.  Pulmonic Valve: The pulmonic valve was grossly normal. Pulmonic valve regurgitation is trivial.  Aorta: The aortic root and ascending aorta are structurally normal, with no evidence of dilitation.  Venous: The inferior vena cava is normal in size with greater than 50% respiratory variability, suggesting right atrial pressure of 3 mmHg.  IAS/Shunts: The interatrial septum was not well visualized.  Additional Comments: 3D was performed not requiring image post processing on an independent workstation and was normal.   LEFT VENTRICLE PLAX 2D LVIDd:         4.20 cm         Diastology LVIDs:         3.15 cm         LV e' medial:    5.72  cm/s LV PW:         1.30 cm         LV E/e' medial:  9.8 LV IVS:        1.30 cm         LV e' lateral:   6.85 cm/s LVOT diam:     2.20 cm         LV E/e' lateral: 8.2 LV SV:         60 LV SV Index:   37              2D Longitudinal LVOT Area:     3.80 cm        Strain LV IVRT:       124 msec        2D Strain GLS   -20.5 % (A4C): 2D Strain GLS   -16.3 % (A3C): 2D Strain GLS   -25.8 % (A2C): 2D Strain GLS   -20.9 % Avg:  3D Volume EF LV 3D EF:    Left ventricul ar ejection fraction by 3D volume is 71 %.  3D Volume EF: 3D EF:        71 %  LV EDV:       135 ml LV ESV:       39 ml LV SV:        96 ml  RIGHT VENTRICLE RV Basal diam:  3.10 cm     PULMONARY VEINS RV S prime:     13.80 cm/s  A Reversal Velocity: 37.50 cm/s TAPSE (M-mode): 1.9 cm      Diastolic Velocity:  34.90 cm/s RVSP:           29.0 mmHg   S/D Velocity:        1.30 Systolic Velocity:   46.70 cm/s  LEFT ATRIUM             Index        RIGHT ATRIUM           Index LA diam:        3.45 cm 2.13 cm/m   RA Pressure: 3.00 mmHg LA Vol (A2C):   41.6 ml 25.65 ml/m  RA Area:     15.40 cm LA Vol (A4C):   45.8 ml 28.24 ml/m  RA Volume:   41.50 ml  25.58 ml/m LA Biplane Vol: 45.3 ml 27.93 ml/m AORTIC VALVE AV Area (Vmax):    1.97 cm AV Area (Vmean):   1.81 cm AV Area (VTI):     1.97 cm AV Vmax:           167.50 cm/s AV Vmean:          109.500 cm/s AV VTI:            0.304 m AV Peak Grad:      11.2 mmHg AV Mean Grad:      6.0 mmHg LVOT Vmax:         86.95 cm/s LVOT Vmean:        52.150 cm/s LVOT VTI:          0.157 m LVOT/AV VTI ratio: 0.52  AORTA Ao Root diam: 3.30 cm Ao Asc diam:  2.70 cm  MITRAL VALVE               TRICUSPID VALVE MV Area (PHT): cm         TR Peak grad:   26.0 mmHg MV Decel Time: 250 msec    TR Vmax:        255.00 cm/s MV E velocity: 56.05 cm/s  Estimated RAP:  3.00 mmHg MV A velocity: 78.20 cm/s  RVSP:           29.0 mmHg MV E/A ratio:  0.72 SHUNTS Systemic VTI:  0.16  m Systemic Diam: 2.20 cm  Lonni Nanas MD Electronically signed by Lonni Nanas MD Signature Date/Time: 05/12/2024/5:50:48 PM    Final          ______________________________________________________________________________________________       Current Reported Medications:.    Active Medications[1]  Physical Exam:    VS:  There were no vitals taken for this visit.   Wt Readings from Last 3 Encounters:  04/30/24 132 lb 4.4 oz (60 kg)  02/25/24 131 lb (59.4 kg)  12/04/23 129 lb (58.5 kg)    GEN: Well nourished, well developed in no acute distress NECK: No JVD; No carotid bruits CARDIAC: ***RRR, no murmurs, rubs, gallops RESPIRATORY:  Clear to auscultation without rales, wheezing or rhonchi  ABDOMEN: Soft, non-tender, non-distended EXTREMITIES:  No edema; No acute deformity     Asessement and Plan:.     ***     Disposition: F/u with ***  Signed, Ameir Faria D Dynastee Brummell, NP      [1]  No outpatient medications have been marked as taking for the 05/18/24 encounter (Appointment) with Akul Leggette D, NP.   "

## 2024-05-18 ENCOUNTER — Ambulatory Visit: Payer: Self-pay | Attending: Cardiology | Admitting: Cardiology

## 2024-05-18 ENCOUNTER — Ambulatory Visit: Admitting: Podiatry

## 2024-05-18 DIAGNOSIS — I10 Essential (primary) hypertension: Secondary | ICD-10-CM

## 2024-05-18 DIAGNOSIS — I2510 Atherosclerotic heart disease of native coronary artery without angina pectoris: Secondary | ICD-10-CM

## 2024-05-18 DIAGNOSIS — E782 Mixed hyperlipidemia: Secondary | ICD-10-CM

## 2024-05-18 DIAGNOSIS — I5020 Unspecified systolic (congestive) heart failure: Secondary | ICD-10-CM

## 2024-05-27 NOTE — Telephone Encounter (Signed)
 Appointment:        Date of patient's next encounter in the current department:  Visit date not found   Date of patient's next encounter with the current provider:  Visit date not found   Date of patient's last encounter in the current department: 05/02/2023      Last BP:   BP Readings from Last 3 Encounters:   05/04/24 120/78   05/10/23 138/86   05/02/23 132/72       Last HGBA1C:   HgbA1C (%)   Date Value   05/02/2023 5.7 (H)       Labs  No results found for: TSH  Lab Results   Component Value Date    CALCIUM 9.9 05/04/2024    ALBUMIN 4.4 05/02/2023    NA 143 05/04/2024    K 4.3 05/04/2024    CO2 27 05/04/2024    CL 106 05/04/2024    BUN 11 05/04/2024    CREATININE 0.84 05/04/2024      AST   Date Value Ref Range Status   05/02/2023 21 0 - 40 IU/L Final     ALT   Date Value Ref Range Status   05/02/2023 16 0 - 44 IU/L Final     No components found for: TBILI  No results found for: HGB  No results found for: HCT  No results found for: PLT  No results found for: PTT  No results found for: INR  No results found for: PROTIME  No results found for: PTADJUSTED    Last Urine Drug Screen: on .  Narcotic Medications:    Directives/Controlled Substance Agreement   PMP Appropriate __YES      ___No

## 2024-06-04 ENCOUNTER — Telehealth: Payer: Self-pay

## 2024-06-04 NOTE — Telephone Encounter (Signed)
 The patient contacted the office with questions about his appointment. He was advised that he had reached the incorrect office and was transferred to [facility] in [facility] for further assistance.

## 2024-06-10 ENCOUNTER — Ambulatory Visit: Admitting: Podiatry

## 2024-06-17 ENCOUNTER — Ambulatory Visit: Admitting: Podiatry

## 2024-06-17 ENCOUNTER — Encounter: Payer: Self-pay | Admitting: Diagnostic Neuroimaging

## 2024-06-17 ENCOUNTER — Ambulatory Visit: Admitting: Diagnostic Neuroimaging

## 2024-06-17 ENCOUNTER — Encounter: Payer: Self-pay | Admitting: Podiatry

## 2024-06-17 DIAGNOSIS — R809 Proteinuria, unspecified: Secondary | ICD-10-CM

## 2024-06-17 DIAGNOSIS — M79674 Pain in right toe(s): Secondary | ICD-10-CM | POA: Diagnosis not present

## 2024-06-17 DIAGNOSIS — R569 Unspecified convulsions: Secondary | ICD-10-CM

## 2024-06-17 DIAGNOSIS — E1129 Type 2 diabetes mellitus with other diabetic kidney complication: Secondary | ICD-10-CM | POA: Diagnosis not present

## 2024-06-17 DIAGNOSIS — M79675 Pain in left toe(s): Secondary | ICD-10-CM

## 2024-06-17 DIAGNOSIS — D492 Neoplasm of unspecified behavior of bone, soft tissue, and skin: Secondary | ICD-10-CM

## 2024-06-17 DIAGNOSIS — B351 Tinea unguium: Secondary | ICD-10-CM | POA: Diagnosis not present

## 2024-06-17 DIAGNOSIS — Q828 Other specified congenital malformations of skin: Secondary | ICD-10-CM

## 2024-06-17 MED ORDER — LEVETIRACETAM 750 MG PO TABS: 750.0000 mg | ORAL_TABLET | Freq: Two times a day (BID) | ORAL | 4 refills | Status: AC

## 2024-06-17 MED ORDER — LACOSAMIDE 200 MG PO TABS: 200.0000 mg | ORAL_TABLET | Freq: Two times a day (BID) | ORAL | 4 refills | Status: AC

## 2024-06-17 NOTE — Progress Notes (Signed)
 This patient presents  to my office for at risk foot care.  This patient requires this care by a professional since this patient will be at risk due to having diabetes.  This patient is unable to cut nails himself since the patient cannot reach his nails.These nails are painful walking and wearing shoes.    He has developed painful callus on both feet which makes it difficult to walk.  He presents to the office with his caregiver.  This patient presents for at risk foot care today.  General Appearance  Alert, conversant and in no acute stress.  Vascular  Dorsalis pedis and posterior tibial  pulses are palpable  bilaterally.  Capillary return is within normal limits  bilaterally. Temperature is within normal limits  bilaterally.  Neurologic  Senn-Weinstein monofilament wire test within normal limits  bilaterally. Muscle power within normal limits bilaterally.  Nails Thick disfigured discolored nails with subungual debris  from hallux to fifth toes bilaterally. No evidence of bacterial infection or drainage bilaterally.  Orthopedic  No limitations of motion  feet .  No crepitus or effusions noted.  No bony pathology or digital deformities noted.  Skin  normotropic skin with no porokeratosis noted bilaterally.  No signs of infections or ulcers noted.   Callus heels  B/L and sub 5th met right foot.  Onychomycosis  Pain in right toes  Pain in left toes  Consent was obtained for treatment procedures.   Mechanical debridement of nails 1-5  bilaterally performed with a nail nipper.  Filed with dremel without incident. Callus were done as a courtesy.   Return office visit     3 months                 Told patient to return for periodic foot care and evaluation due to potential at risk complications.   Cordella Bold DPM

## 2024-06-17 NOTE — Patient Instructions (Signed)
" °  SEIZURE DISORDER (last seizure 02/27/21,  10/16/20; post-stroke 2021; Dec 2025; h/o alcohol, cocaine abuse)  - continue levetiracetam  750mg  twice a day   - continue vimpat  200mg  twice a day  - avoid cocaine, alcohol  - According to  law, you can not drive unless you are seizure / syncope free for at least 6 months and under physician's care.   - Please maintain precautions. Do not participate in activities where a loss of awareness could harm you or someone else. No swimming alone, no tub bathing, no hot tubs, no driving, no operating motorized vehicles (cars, ATVs, motocycles, etc), lawnmowers, power tools or firearms. No standing at heights, such as rooftops, ladders or stairs. Avoid hot objects such as stoves, heaters, open fires. Wear a helmet when riding a bicycle, scooter, skateboard, etc. and avoid areas of traffic. Set your water heater to 120 degrees or less. "

## 2024-06-17 NOTE — Progress Notes (Signed)
 "  [facility]  PATIENT: Arthur [name] DOB: 27-Apr-1961  REFERRING CLINICIAN: Tobie Gaines, DO HISTORY FROM: patient and aid REASON FOR VISIT: follow up   HISTORICAL  CHIEF COMPLAINT:  Chief Complaint  Patient presents with   RM 6     Patient is here for hospital follow-up for seizures - Two days before thanksgiving, and another episode December 14th. Some headaches, and needs a medication refill. Was drinking alcohol while off medication and had a seizure. No issues since restarting medication. Some light headedness and headaches     HISTORY OF PRESENT ILLNESS:   UPDATE (06/17/24, VRP): Since last visit, had breakthrough seizure in Dec 2025, due to no being able to get refills for 1 week. Also was using alcohol and some cocaine. Now back on anti seizure meds and doing well. Has stopped alcohol and drugs for now.   UPDATE (01/23/22, VRP): Since last visit, went to hospital in Sept - Oct 2022 for focal status epilepticus (focal right-sided twitching, right gaze deviation and not following commands). Now on LEV and vimpat  and doing well.   PRIOR HPI (01/18/21): 64 year old male here for evaluation of seizure/encephalopathy.  History of alcohol abuse and prior left temporoparietal stroke.  Patient presented to the hospital for new onset of facial droop and slurred speech.  Code stroke was activated.  IV tPA was started and patient began to have shivering and blood in his mouth.  Seizure was suspected.  tPA was stopped and patient was given Ativan  4 mg for possible seizure versus alcohol withdrawal.  Continuous EEG was performed.  Electrographic seizures were noted averaging 3 to 4/h lasting 20 seconds.  These seemed to arise from left frontotemporal region.  Patient was started on antiseizure medication.  He was stabilized and discharged on levetiracetam  500 mg twice a day and Vimpat  50 mg twice a day.  Since discharge patient is doing well and no further events.  Tolerating  medications.   REVIEW OF SYSTEMS: Full 14 system review of systems performed and negative with exception of: as per HPI.  ALLERGIES: Allergies  Allergen Reactions   Penicillins Hives    Did it involve swelling of the face/tongue/throat, SOB, or low BP? Y Did it involve sudden or severe rash/hives, skin peeling, or any reaction on the inside of your mouth or nose? N Did you need to seek medical attention at a hospital or doctor's office? Y When did it last happen?  Over 5 Years Ago     If all above answers are NO, may proceed with cephalosporin use.     HOME MEDICATIONS: Outpatient Medications Prior to Visit  Medication Sig Dispense Refill   aspirin  EC 81 MG tablet Take 1 tablet (81 mg total) by mouth daily. Swallow whole. 90 tablet 3   atorvastatin  (LIPITOR ) 40 MG tablet Take 1 tablet (40 mg total) by mouth daily. 90 tablet 3   Blood Pressure Monitor KIT Use to check blood pressure dailly. 1 kit 0   empagliflozin  (JARDIANCE ) 10 MG TABS tablet Take 1 tablet (10 mg total) by mouth daily before breakfast. 90 tablet 3   metoprolol  succinate (TOPROL -XL) 50 MG 24 hr tablet Take 1 tablet (50 mg total) by mouth daily with or immediately following a meal (bedtime). 90 tablet 3   sacubitril -valsartan  (ENTRESTO ) 49-51 MG Take 1 tablet by mouth 2 (two) times daily. 180 tablet 3   sertraline  (ZOLOFT ) 50 MG tablet Take 1 tablet (50 mg total) by mouth daily. 30 tablet 2  lacosamide  (VIMPAT ) 200 MG TABS tablet Take 1 tablet (200 mg total) by mouth 2 (two) times daily. 180 tablet 3   levETIRAcetam  (KEPPRA ) 750 MG tablet Take 1 tablet (750 mg total) by mouth 2 (two) times daily. 90 tablet 3   cholecalciferol  (VITAMIN D ) 25 MCG (1000 UNIT) tablet Take by mouth. (Patient not taking: Reported on 06/17/2024) 90 tablet 3   nicotine  polacrilex (COMMIT) 2 MG lozenge Take 1 lozenge (2 mg total) by mouth as needed for smoking cessation. (Patient not taking: Reported on 06/17/2024) 72 tablet 0   lacosamide   (VIMPAT ) 200 MG TABS tablet Take 1 tablet (200 mg total) by mouth 2 (two) times daily. (Patient not taking: Reported on 06/17/2024) 60 tablet 2   levETIRAcetam  (KEPPRA ) 750 MG tablet Take 1 tablet (750 mg total) by mouth 2 (two) times daily. (Patient not taking: Reported on 06/17/2024) 60 tablet 0   No facility-administered medications prior to visit.    PAST MEDICAL HISTORY: Past Medical History:  Diagnosis Date   DM2 (diabetes mellitus, type 2) (HCC)    ETOH abuse    Hypertension    Seizures (HCC)    Transaminitis     PAST SURGICAL HISTORY: Past Surgical History:  Procedure Laterality Date   CORONARY PRESSURE/FFR STUDY N/A 06/27/2021   Procedure: INTRAVASCULAR PRESSURE WIRE/FFR STUDY;  Surgeon: Elmira Newman PARAS, MD;  Location: [facility];  Service: Cardiovascular;  Laterality: N/A;   HEMORRHOID SURGERY     LEFT HEART CATH AND CORONARY ANGIOGRAPHY N/A 06/27/2021   Procedure: LEFT HEART CATH AND CORONARY ANGIOGRAPHY;  Surgeon: Elmira Newman PARAS, MD;  Location: [facility];  Service: Cardiovascular;  Laterality: N/A;    FAMILY HISTORY: Family History  Problem Relation Age of Onset   Diabetes Mother    Hypertension Mother    Diabetes Father    Hypertension Father    Hypertension Sister    Migraines Neg Hx    Seizures Neg Hx    Stroke Neg Hx    Sleep apnea Neg Hx     SOCIAL HISTORY: Social History   Socioeconomic History   Marital status: Married    Spouse name: Dedra   Number of children: 6   Years of education: Not on file   Highest education level: Not on file  Occupational History   Not on file  Tobacco Use   Smoking status: Some Days    Types: Cigars   Smokeless tobacco: Never   Tobacco comments:    Smokes black & mild - 2 cigars a day.  Vaping Use   Vaping status: Never Used  Substance and Sexual Activity   Alcohol use: Not Currently    Comment: non-alchol beer   Drug use: Never   Sexual activity: Not on file  Other Topics Concern    Not on file  Social History Narrative   Right handed   Lives at home with wife    2-3 cups of cadd per day    None caffeine sodas and juice    Social Drivers of Health   Tobacco Use: High Risk (06/17/2024)   Patient History    Smoking Tobacco Use: Some Days    Smokeless Tobacco Use: Never    Passive Exposure: Not on file  Financial Resource Strain: Low Risk (05/17/2023)   Overall Financial Resource Strain (CARDIA)    Difficulty of Paying Living Expenses: Not very hard  Food Insecurity: No Food Insecurity (02/03/2024)   Epic    Worried About Running  Out of Food in the Last Year: Never true    Ran Out of Food in the Last Year: Never true  Transportation Needs: No Transportation Needs (02/03/2024)   Epic    Lack of Transportation (Medical): No    Lack of Transportation (Non-Medical): No  Physical Activity: Insufficiently Active (08/21/2022)   Exercise Vital Sign    Days of Exercise per Week: 7 days    Minutes of Exercise per Session: 10 min  Stress: No Stress Concern Present (01/07/2023)   [employer] of Occupational Health - Occupational Stress Questionnaire    Feeling of Stress : Not at all  Social Connections: Socially Integrated (07/18/2023)   Social Connection and Isolation Panel    Frequency of Communication with Friends and Family: More than three times a week    Frequency of Social Gatherings with Friends and Family: More than three times a week    Attends Religious Services: More than 4 times per year    Active Member of Clubs or Organizations: Yes    Attends Banker Meetings: More than 4 times per year    Marital Status: Married  Catering Manager Violence: Not At Risk (02/03/2024)   Epic    Fear of Current or Ex-Partner: No    Emotionally Abused: No    Physically Abused: No    Sexually Abused: No  Depression (PHQ2-9): Low Risk (02/03/2024)   Depression (PHQ2-9)    PHQ-2 Score: 1  Alcohol Screen: Low Risk (03/08/2023)   Alcohol Screen    Last Alcohol  Screening Score (AUDIT): 0  Housing: Low Risk (02/03/2024)   Epic    Unable to Pay for Housing in the Last Year: No    Number of Times Moved in the Last Year: 0    Homeless in the Last Year: No  Utilities: Not At Risk (02/03/2024)   Epic    Threatened with loss of utilities: No  Health Literacy: Adequate Health Literacy (12/07/2022)   B1300 Health Literacy    Frequency of need for help with medical instructions: Never     PHYSICAL EXAM  GENERAL EXAM/CONSTITUTIONAL: Vitals:  Vitals:   06/17/24 1430  BP: (!) 151/93  Pulse: 93  Weight: 134 lb 3.2 oz (60.9 kg)  Height: 5' 4 (1.626 m)   Body mass index is 23.04 kg/m. Wt Readings from Last 3 Encounters:  06/17/24 134 lb 3.2 oz (60.9 kg)  04/30/24 132 lb 4.4 oz (60 kg)  02/25/24 131 lb (59.4 kg)   Patient is in no distress; well developed, nourished and groomed; neck is supple  CARDIOVASCULAR: Examination of carotid arteries is normal; no carotid bruits Regular rate and rhythm, no murmurs Examination of peripheral vascular system by observation and palpation is normal  EYES: Ophthalmoscopic exam of optic discs and posterior segments is normal; no papilledema or hemorrhages No results found.  MUSCULOSKELETAL: Gait, strength, tone, movements noted in Neurologic exam below  NEUROLOGIC: MENTAL STATUS:      No data to display         awake, alert, oriented to person, place and time recent and remote memory intact normal attention and concentration DECR FLUENCY; comprehension intact, naming intact fund of knowledge appropriate  CRANIAL NERVE:  2nd - no papilledema on fundoscopic exam 2nd, 3rd, 4th, 6th - pupils equal and reactive to light, visual fields full to confrontation, extraocular muscles intact, no nystagmus 5th - facial sensation symmetric 7th - facial strength symmetric 8th - hearing intact 9th - palate elevates symmetrically, uvula midline  11th - shoulder shrug symmetric 12th - tongue protrusion  midline  MOTOR:  normal bulk and tone, full strength in the BUE, BLE  SENSORY:  normal and symmetric to light touch, temperature, vibration  COORDINATION:  finger-nose-finger, fine finger movements normal  REFLEXES:  deep tendon reflexes 1+ and symmetric  GAIT/STATION:  narrow based gait     DIAGNOSTIC DATA (LABS, IMAGING, TESTING) - I reviewed patient records, labs, notes, testing and imaging myself where available.  Lab Results  Component Value Date   WBC 6.1 04/30/2024   HGB 14.8 04/30/2024   HCT 46.3 04/30/2024   MCV 80.8 04/30/2024   PLT 213 04/30/2024      Component Value Date/Time   NA 137 04/30/2024 1609   NA 145 (H) 07/17/2023 1118   K 3.9 04/30/2024 1609   CL 101 04/30/2024 1609   CO2 14 (L) 04/30/2024 1609   GLUCOSE 91 04/30/2024 1609   BUN 10 04/30/2024 1609   BUN 12 07/17/2023 1118   CREATININE 1.36 (H) 04/30/2024 1609   CALCIUM  9.1 04/30/2024 1609   PROT 7.0 04/30/2024 1609   PROT 7.4 02/25/2023 1211   ALBUMIN 3.7 04/30/2024 1609   ALBUMIN 4.6 02/25/2023 1211   AST 34 04/30/2024 1609   ALT 19 04/30/2024 1609   ALKPHOS 70 04/30/2024 1609   BILITOT 0.6 04/30/2024 1609   BILITOT 0.6 02/25/2023 1211   GFRNONAA 59 (L) 04/30/2024 1609   GFRAA 95 01/11/2020 0959   Lab Results  Component Value Date   CHOL 109 02/25/2023   HDL 66 02/25/2023   LDLCALC 33 02/25/2023   LDLDIRECT 124.3 (H) 10/17/2020   TRIG 32 02/25/2023   CHOLHDL 1.7 02/25/2023   Lab Results  Component Value Date   HGBA1C 5.8 (A) 07/17/2023   Lab Results  Component Value Date   VITAMINB12 269 09/05/2021   Lab Results  Component Value Date   TSH 1.080 09/05/2021    10/17/2020 MRI brain with and without contrast - Abnormal diffusion signal involving the left hippocampus. Primary differential considerations are seizure effect and subacute infarct. - Multiple chronic infarcts.  10/16/2020 EEG - This study is suggestive of severe diffuse encephalopathy, nonspecific etiology  but likely related to sedation.  No seizures or epileptiform discharges were seen during the study  10/17/2020 cEEG - This study showed seizures without clinical signs arising from left frontotemporal region, average 3-4/hour, duration about 20 seconds, last seizure at 0908 on 10/17/2020.  Additionally there is evidence of cortical dysfunction and epileptogenicity arising from left frontotemporal region due to underlying stroke.  There is also severe diffuse encephalopathy, nonspecific etiology but likely related to sedation.   10/18/20 cEEG - This study showed evidence of cortical dysfunction and epileptogenicity arising from left frontotemporal region due to underlying stroke.  There is also severe diffuse encephalopathy, nonspecific etiology but likely related to sedation.  No seizures were seen during the study.  02/25/21 overnight vEEG  ABNORMALITY - Focal non convulsive status epilepticus, left posterior quadrant - Brief-ictal-interictal rhythmic discharges ( BIRD) , left posterior quadrant - Lateralized rhythmic delta activity, left hemisphere and maximal left posterior quadrant - Continuous slow, generalized and maximal left posterior quadrant   IMPRESSION: This study initially showed evidence of epileptogenicity and cortical dysfunction arising from left posterior quadrant with brief-ictal-interictal rhythmic discharges. This EEG pattern is on the ictal-interictal continuum with high potential for seizures. Gradually eeg showed evolution in morphology and frequency.  Per review of notes, patient had trouble answering questions. This EEG is consistent  with focal non convulsive status epilepticus.     Additionally, there is also moderate diffuse encephalopathy, nonspecific etiology but likely related to seizure.     ASSESSMENT AND PLAN  64 y.o. year old male here with:  Dx:  1. Seizures (HCC)      PLAN:  SEIZURE DISORDER (last seizure 02/27/21,  10/16/20; post-stroke 2021; Dec  2025; h/o alcohol abuse, cocaine use)  - continue levetiracetam  750mg  twice a day   - continue vimpat  200mg  twice a day  - avoid cocaine, alcohol  - According to [place] law, you can not drive unless you are seizure / syncope free for at least 6 months and under physician's care.   - Please maintain precautions. Do not participate in activities where a loss of awareness could harm you or someone else. No swimming alone, no tub bathing, no hot tubs, no driving, no operating motorized vehicles (cars, ATVs, motocycles, etc), lawnmowers, power tools or firearms. No standing at heights, such as rooftops, ladders or stairs. Avoid hot objects such as stoves, heaters, open fires. Wear a helmet when riding a bicycle, scooter, skateboard, etc. and avoid areas of traffic. Set your water heater to 120 degrees or less.  Meds ordered this encounter  Medications   lacosamide  (VIMPAT ) 200 MG TABS tablet    Sig: Take 1 tablet (200 mg total) by mouth 2 (two) times daily.    Dispense:  180 tablet    Refill:  4   levETIRAcetam  (KEPPRA ) 750 MG tablet    Sig: Take 1 tablet (750 mg total) by mouth 2 (two) times daily.    Dispense:  180 tablet    Refill:  4   Return in about 9 months (around 03/17/2025) for with NP, MyChart visit (15 min).    EDUARD FABIENE HANLON, MD 06/17/2024, 3:01 PM Certified in Neurology, Neurophysiology and Neuroimaging  [facility] [address], [address] [place], [place] [address] [phone] "

## 2024-06-29 ENCOUNTER — Ambulatory Visit: Payer: Self-pay | Admitting: Student

## 2024-06-29 ENCOUNTER — Ambulatory Visit: Admitting: Student

## 2024-06-30 ENCOUNTER — Telehealth: Payer: Self-pay | Admitting: *Deleted

## 2024-06-30 ENCOUNTER — Ambulatory Visit: Admitting: Student

## 2024-06-30 ENCOUNTER — Encounter: Payer: Self-pay | Admitting: Student

## 2024-06-30 VITALS — BP 154/82 | HR 58 | Temp 98.4°F | Ht 64.0 in | Wt 134.8 lb

## 2024-06-30 DIAGNOSIS — Z1211 Encounter for screening for malignant neoplasm of colon: Secondary | ICD-10-CM

## 2024-06-30 DIAGNOSIS — I10 Essential (primary) hypertension: Secondary | ICD-10-CM

## 2024-06-30 DIAGNOSIS — E1129 Type 2 diabetes mellitus with other diabetic kidney complication: Secondary | ICD-10-CM

## 2024-06-30 DIAGNOSIS — F320 Major depressive disorder, single episode, mild: Secondary | ICD-10-CM

## 2024-06-30 DIAGNOSIS — I5020 Unspecified systolic (congestive) heart failure: Secondary | ICD-10-CM

## 2024-06-30 DIAGNOSIS — F1020 Alcohol dependence, uncomplicated: Secondary | ICD-10-CM

## 2024-06-30 DIAGNOSIS — F17200 Nicotine dependence, unspecified, uncomplicated: Secondary | ICD-10-CM

## 2024-06-30 DIAGNOSIS — R569 Unspecified convulsions: Secondary | ICD-10-CM

## 2024-06-30 LAB — POCT GLYCOSYLATED HEMOGLOBIN (HGB A1C): HbA1c, POC (controlled diabetic range): 5.5 % (ref 0.0–7.0)

## 2024-06-30 LAB — GLUCOSE, CAPILLARY: Glucose-Capillary: 95 mg/dL (ref 70–99)

## 2024-06-30 NOTE — Assessment & Plan Note (Addendum)
 Past medical history of Tobacco use disorder. He smokes about 2 black and milds a day. He is not willing to quit at this time.   Plan: - Encouraged tobacco cessation - 4 minutes spent on tobacco cessation counseling today

## 2024-06-30 NOTE — Patient Instructions (Addendum)
 Arthur [name], Thank you for allowing me to take part in your care today.  Here are your instructions.  1.  Please come back in 2 weeks with your blood pressure log.  2.  I will check your labs, I will call with the results  3.  Please do not take any more cocaine, please do not drink any more alcohol, we will work on tobacco use  4.  Continue taking your Zoloft  daily.  5.  Please come back to see me in March 2026.  6.  When you are ready, to talk to counseling, please contact the [facility].  [facility] Address: [address], [place], [place] [address] Phone: [phone]   PLEASE BRING YOUR MEDICATIONS TO EVERY APPOINTMENT  Thank you, Dr. Tobie  If you have any other questions please contact the internal medicine clinic at [phone] If it is after hours, please call the [facility] at [phone] and then ask the person who picks up for the resident on call.

## 2024-06-30 NOTE — Assessment & Plan Note (Signed)
 Patient has a past medical history of depression.  He was to be on Zoloft  50 mg daily.  He however has not been taking as his friends told him it is not a good medicine.  Since stopping the medicine he has been very irritable and lashing out.  His caretaker who is at bedside states that she has started given this back to him and his mood has improved.  She also wants him to see [facility].  I encouraged him to make an appointment.  No SI/HI.  Plan: - Continue Zoloft  50 mg daily, education provided on importance of taking Zoloft  - Information about [facility] given to patient for CBT

## 2024-06-30 NOTE — Assessment & Plan Note (Addendum)
 Patient has a past medical history of hypertension.  Blood pressure elevated today at 168/97.  Current antihypertensives include metoprolol  succinate 50 mg daily.  He is also on Entresto  49-51 mg twice daily, but only taking it 1 time a day.  Blood pressure today 168/97 and on recheck 154/82.  His caretaker is at bedside who states blood pressures are normally in the 120s at home.  Did not bring a log today.  Encourage patient to bring log at next visit.  Will have patient keep blood pressure log and return in 2 weeks.  Patient is not on amlodipine .  Has not been filled since last April.  Plan: - Follow-up in 2 weeks with blood pressure log for nurse visit - Continue with metoprolol  succinate 50 mg daily - Patient encouraged to start taking his Entresto  twice daily instead of once daily - Follow-up BMP, lab hemolyzed, will need to get redrawn, instructed nursing to call patient to come back for redraw

## 2024-06-30 NOTE — Progress Notes (Signed)
 "  CC: Chronic condition follow-up  HPI:  [name] is a 64 y.o. male with past medical history of hypertension, CAD, HFrEF, type 2 diabetes who presents for condition follow-up.  Please see assessment and plan for full HPI.   Medications: CAD: Aspirin  81 mg daily, Lipitor  40 mg daily Vitamin D  deficiency: Vitamin D  supplementation HFrEF: Jardiance  10 mg daily, metoprolol  succinate 50 mg daily, Entresto  49-51 mg twice daily Seizures: Lacosamide  200 mg twice daily, Keppra  750 mg twice daily Depression: Zoloft  50 mg daily Tobacco use: Nicotine  lozenges  Past Medical History:  Diagnosis Date   DM2 (diabetes mellitus, type 2) (HCC)    ETOH abuse    Hypertension    Seizures (HCC)    Transaminitis     Current Medications[1]  Review of Systems:   Negative except for what is stated in HPI  Physical Exam:  Vitals:   06/30/24 1036 06/30/24 1123  BP: (!) 168/97 (!) 154/82  Pulse: (!) 57 (!) 58  Temp: 98.4 F (36.9 C)   TempSrc: Oral   SpO2: 98%   Weight: 134 lb 12.8 oz (61.1 kg)   Height: 5' 4 (1.626 m)    General: Patient is sitting comfortably in the room  Head: Normocephalic, atraumatic  Cardio: Regular rate and rhythm, no murmurs, rubs or gallops Pulmonary: Clear to ausculation bilaterally with no rales, rhonchi, and crackles    Assessment & Plan:   Assessment & Plan Essential hypertension Patient has a past medical history of hypertension.  Blood pressure elevated today at 168/97.  Current antihypertensives include metoprolol  succinate 50 mg daily.  He is also on Entresto  49-51 mg twice daily, but only taking it 1 time a day.  Blood pressure today 168/97 and on recheck 154/82.  His caretaker is at bedside who states blood pressures are normally in the 120s at home.  Did not bring a log today.  Encourage patient to bring log at next visit.  Will have patient keep blood pressure log and return in 2 weeks.  Patient is not on amlodipine .  Has not been filled since  last April.  Plan: - Follow-up in 2 weeks with blood pressure log for nurse visit - Continue with metoprolol  succinate 50 mg daily - Patient encouraged to start taking his Entresto  twice daily instead of once daily - Follow-up BMP, lab hemolyzed, will need to get redrawn, instructed nursing to call patient to come back for redraw  Controlled type 2 diabetes mellitus with microalbuminuria, without long-term current use of insulin  [facility]) Patient recently had foot exam with podiatry on 06/17/2024.  At that time patient had normal pulses with thick disfigured color nails with subungual debris.  Patient had debridement with podiatry on 06/17/2024.  A1c today 5.5.  Well-controlled at this time.  Patient is on Jardiance  10 mg daily.  Patient has eye exam due on April 2026.  No acute concerns at this time  Plan: - Continue Jardiance  10 mg daily - Foot exam updated at podiatrist office - Patient has eye exam on April 2026 - Follow-up urine microalbumin creatinine ratio - Follow-up BMP HFrEF (heart failure with reduced ejection fraction) (HCC) Patient has a past medical history of Heart failure with recovered ejection fraction. He is supposed to be on Jardiance  10 mg daily, metoprolol  succinate 50 mg daily, Entresto  49-51 mg twice daily. He Is not taking the entresto  twice daily but rather taking it one time a day. Patient had a ECHO in 05/12/2024 which showed LVEF 65-70%. Patient  had elevated BP today.   Plan:  - Patient is euvolemic on exam - Continue Jardiance  10 mg daily, metoprolol  succinate 50 mg daily - Patient instructed to increase Entresto  to twice daily 49-51 mg  - Patient to follow up in 2 weeks for BP follow up  Alcohol use disorder, severe, dependence (HCC) Past medical history of AUD. Patient quit drinking 2 weeks ago. He does not have interests in naltrexone . Did discuss CBT with patient who states that when he is ready he will go to CBT. [facility] information given to patient.    Plan: - [facility] information given to patient as patient is not intrested in naltrexone    Tobacco use disorder Past medical history of Tobacco use disorder. He smokes about 2 black and milds a day. He is not willing to quit at this time.   Plan: - Encouraged tobacco cessation - 4 minutes spent on tobacco cessation counseling today   Current mild episode of major depressive disorder without prior episode Patient has a past medical history of depression.  He was to be on Zoloft  50 mg daily.  He however has not been taking as his friends told him it is not a good medicine.  Since stopping the medicine he has been very irritable and lashing out.  His caretaker who is at bedside states that she has started given this back to him and his mood has improved.  She also wants him to see [facility].  I encouraged him to make an appointment.  No SI/HI.  Plan: - Continue Zoloft  50 mg daily, education provided on importance of taking Zoloft  - Information about [facility] given to patient for CBT Colon cancer screening Patient referred for colon cancer screening.  Seizures (HCC) Patient has a past medical history of seizures.  He recently had a seizure in December after doing cocaine.  He has not done cocaine since.  He has not had any seizures since.  Recommended patient to refrain from cocaine use.  He agreed.   Plan: - Continue lacosamide  200 mg twice daily - Continue Keppra  750 mg twice daily - Patient encouraged to stay away from any substances   Patient discussed with Dr. Shawn Libby Blanch, DO Internal Medicine Resident PGY-3     [1]  Current Outpatient Medications:    aspirin  EC 81 MG tablet, Take 1 tablet (81 mg total) by mouth daily. Swallow whole., Disp: 90 tablet, Rfl: 3   atorvastatin  (LIPITOR ) 40 MG tablet, Take 1 tablet (40 mg total) by mouth daily., Disp: 90 tablet, Rfl: 3   Blood Pressure Monitor KIT, Use to check blood pressure dailly., Disp: 1 kit, Rfl: 0    cholecalciferol  (VITAMIN D ) 25 MCG (1000 UNIT) tablet, Take by mouth. (Patient not taking: Reported on 06/17/2024), Disp: 90 tablet, Rfl: 3   empagliflozin  (JARDIANCE ) 10 MG TABS tablet, Take 1 tablet (10 mg total) by mouth daily before breakfast., Disp: 90 tablet, Rfl: 3   lacosamide  (VIMPAT ) 200 MG TABS tablet, Take 1 tablet (200 mg total) by mouth 2 (two) times daily., Disp: 180 tablet, Rfl: 4   levETIRAcetam  (KEPPRA ) 750 MG tablet, Take 1 tablet (750 mg total) by mouth 2 (two) times daily., Disp: 180 tablet, Rfl: 4   metoprolol  succinate (TOPROL -XL) 50 MG 24 hr tablet, Take 1 tablet (50 mg total) by mouth daily with or immediately following a meal (bedtime)., Disp: 90 tablet, Rfl: 3   nicotine  polacrilex (COMMIT) 2 MG lozenge, Take 1 lozenge (2 mg total) by  mouth as needed for smoking cessation. (Patient not taking: Reported on 06/17/2024), Disp: 72 tablet, Rfl: 0   sacubitril -valsartan  (ENTRESTO ) 49-51 MG, Take 1 tablet by mouth 2 (two) times daily., Disp: 180 tablet, Rfl: 3   sertraline  (ZOLOFT ) 50 MG tablet, Take 1 tablet (50 mg total) by mouth daily., Disp: 30 tablet, Rfl: 2  "

## 2024-06-30 NOTE — Assessment & Plan Note (Addendum)
 Patient referred for colon cancer screening.

## 2024-06-30 NOTE — Progress Notes (Signed)
 Internal Medicine Clinic Attending  Case discussed with the resident at the time of the visit.  We reviewed the resident's history and exam and pertinent patient test results.  I agree with the assessment, diagnosis, and plan of care documented in the resident's note.

## 2024-06-30 NOTE — Assessment & Plan Note (Signed)
 Patient has a past medical history of seizures.  He recently had a seizure in December after doing cocaine.  He has not done cocaine since.  He has not had any seizures since.  Recommended patient to refrain from cocaine use.  He agreed.   Plan: - Continue lacosamide  200 mg twice daily - Continue Keppra  750 mg twice daily - Patient encouraged to stay away from any substances

## 2024-06-30 NOTE — Assessment & Plan Note (Addendum)
 Patient recently had foot exam with podiatry on 06/17/2024.  At that time patient had normal pulses with thick disfigured color nails with subungual debris.  Patient had debridement with podiatry on 06/17/2024.  A1c today 5.5.  Well-controlled at this time.  Patient is on Jardiance  10 mg daily.  Patient has eye exam due on April 2026.  No acute concerns at this time  Plan: - Continue Jardiance  10 mg daily - Foot exam updated at podiatrist office - Patient has eye exam on April 2026 - Follow-up urine microalbumin creatinine ratio - Follow-up BMP

## 2024-07-01 ENCOUNTER — Ambulatory Visit: Payer: Self-pay | Admitting: Student

## 2024-07-01 ENCOUNTER — Other Ambulatory Visit: Payer: Self-pay

## 2024-07-01 DIAGNOSIS — I10 Essential (primary) hypertension: Secondary | ICD-10-CM

## 2024-07-01 LAB — MICROALBUMIN / CREATININE URINE RATIO
Creatinine, Urine: 193.6 mg/dL
Microalb/Creat Ratio: 58 mg/g{creat} — ABNORMAL HIGH (ref 0–29)
Microalbumin, Urine: 113 ug/mL

## 2024-07-02 ENCOUNTER — Other Ambulatory Visit: Payer: Self-pay | Admitting: Student

## 2024-07-02 LAB — BASIC METABOLIC PANEL WITH GFR
BUN/Creatinine Ratio: 7 — ABNORMAL LOW (ref 10–24)
BUN: 8 mg/dL (ref 8–27)
CO2: 23 mmol/L (ref 20–29)
Calcium: 9.4 mg/dL (ref 8.6–10.2)
Chloride: 103 mmol/L (ref 96–106)
Creatinine, Ser: 1.07 mg/dL (ref 0.76–1.27)
Glucose: 133 mg/dL — ABNORMAL HIGH (ref 70–99)
Potassium: 4.1 mmol/L (ref 3.5–5.2)
Sodium: 147 mmol/L — ABNORMAL HIGH (ref 134–144)
eGFR: 79 mL/min/{1.73_m2}

## 2024-07-02 NOTE — Telephone Encounter (Unsigned)
 Copied from CRM [id number]. Topic: General - Other >> Jul 02, 2024 11:27 AM Susanna ORN wrote: Reason for CRM: Megan, case manager, with [employer], called to verify patient's last visit & she also states that she will be faxing over some documents to request visit information and sending a 3051 form that needs to be completed within the next business day by the provider. Provided her with the clinic's fax number.

## 2024-07-02 NOTE — Progress Notes (Signed)
 Patient came in for labs yesterday.  Patient's caregiver was concerned about elevated blood pressures that she had at home at 200/100.  Patient denies any headache, chest pain, shortness of breath.  Evaluated in the clinic and blood pressure is 150s over 80s.  Instructed patient to keep blood pressure log at home and bring in cuff at next visit, RN visit next week to compare their cuff and our cuff.

## 2024-07-09 ENCOUNTER — Ambulatory Visit: Payer: Self-pay

## 2024-07-14 ENCOUNTER — Ambulatory Visit: Payer: Self-pay

## 2024-09-16 ENCOUNTER — Ambulatory Visit: Admitting: Podiatry

## 2024-12-07 ENCOUNTER — Ambulatory Visit: Admitting: Family Medicine

## 2025-03-17 ENCOUNTER — Telehealth: Admitting: Diagnostic Neuroimaging
